# Patient Record
Sex: Female | Born: 1945
Health system: Southern US, Community
[De-identification: ages and names within clinical notes are randomized; demographics above are authoritative.]

## PROBLEM LIST (undated history)

## (undated) DIAGNOSIS — I872 Venous insufficiency (chronic) (peripheral): Secondary | ICD-10-CM

## (undated) DIAGNOSIS — G4730 Sleep apnea, unspecified: Secondary | ICD-10-CM

## (undated) DIAGNOSIS — Z950 Presence of cardiac pacemaker: Secondary | ICD-10-CM

## (undated) DIAGNOSIS — E669 Obesity, unspecified: Secondary | ICD-10-CM

## (undated) DIAGNOSIS — M1990 Unspecified osteoarthritis, unspecified site: Secondary | ICD-10-CM

## (undated) DIAGNOSIS — I509 Heart failure, unspecified: Secondary | ICD-10-CM

## (undated) DIAGNOSIS — I10 Essential (primary) hypertension: Secondary | ICD-10-CM

## (undated) DIAGNOSIS — I495 Sick sinus syndrome: Secondary | ICD-10-CM

## (undated) DIAGNOSIS — E785 Hyperlipidemia, unspecified: Secondary | ICD-10-CM

## (undated) DIAGNOSIS — K219 Gastro-esophageal reflux disease without esophagitis: Secondary | ICD-10-CM

## (undated) DIAGNOSIS — I890 Lymphedema, not elsewhere classified: Secondary | ICD-10-CM

## (undated) DIAGNOSIS — I5031 Acute diastolic (congestive) heart failure: Secondary | ICD-10-CM

## (undated) DIAGNOSIS — I639 Cerebral infarction, unspecified: Secondary | ICD-10-CM

## (undated) DIAGNOSIS — I4819 Other persistent atrial fibrillation: Secondary | ICD-10-CM

## (undated) DIAGNOSIS — I4891 Unspecified atrial fibrillation: Secondary | ICD-10-CM

## (undated) DIAGNOSIS — E039 Hypothyroidism, unspecified: Secondary | ICD-10-CM

## (undated) DIAGNOSIS — J45909 Unspecified asthma, uncomplicated: Secondary | ICD-10-CM

## (undated) DIAGNOSIS — F411 Generalized anxiety disorder: Secondary | ICD-10-CM

## (undated) HISTORY — DX: Hypothyroidism, unspecified: E03.9

## (undated) HISTORY — DX: Other persistent atrial fibrillation: I48.19

## (undated) HISTORY — DX: Gastro-esophageal reflux disease without esophagitis: K21.9

## (undated) HISTORY — DX: Unspecified osteoarthritis, unspecified site: M19.90

## (undated) HISTORY — DX: Acute diastolic (congestive) heart failure: I50.31

## (undated) HISTORY — DX: Unspecified atrial fibrillation: I48.91

## (undated) HISTORY — DX: Sleep apnea, unspecified: G47.30

## (undated) HISTORY — DX: Essential (primary) hypertension: I10

## (undated) HISTORY — DX: Hyperlipidemia, unspecified: E78.5

## (undated) HISTORY — PX: PACEMAKER INSERTION: SHX728

## (undated) HISTORY — DX: Sick sinus syndrome: I49.5

## (undated) HISTORY — DX: Generalized anxiety disorder: F41.1

## (undated) HISTORY — DX: Cerebral infarction, unspecified: I63.9

## (undated) HISTORY — DX: Obesity, unspecified: E66.9

## (undated) HISTORY — DX: Heart failure, unspecified: I50.9

## (undated) HISTORY — DX: Venous insufficiency (chronic) (peripheral): I87.2

---

## 2007-12-01 ENCOUNTER — Ambulatory Visit: Payer: Self-pay | Admitting: Cardiology

## 2007-12-05 ENCOUNTER — Inpatient Hospital Stay (HOSPITAL_COMMUNITY): Admission: AD | Admit: 2007-12-05 | Discharge: 2007-12-08 | Payer: Self-pay | Admitting: Internal Medicine

## 2007-12-05 ENCOUNTER — Ambulatory Visit: Payer: Self-pay | Admitting: Cardiology

## 2007-12-07 ENCOUNTER — Encounter: Payer: Self-pay | Admitting: Cardiology

## 2007-12-12 ENCOUNTER — Ambulatory Visit: Payer: Self-pay | Admitting: Cardiology

## 2007-12-18 ENCOUNTER — Ambulatory Visit: Payer: Self-pay | Admitting: Cardiology

## 2007-12-20 ENCOUNTER — Ambulatory Visit: Payer: Self-pay | Admitting: Cardiology

## 2007-12-26 ENCOUNTER — Ambulatory Visit: Payer: Self-pay | Admitting: Cardiology

## 2008-01-02 ENCOUNTER — Ambulatory Visit: Payer: Self-pay

## 2008-01-02 ENCOUNTER — Ambulatory Visit: Payer: Self-pay | Admitting: Cardiovascular Disease

## 2008-01-04 ENCOUNTER — Ambulatory Visit: Payer: Self-pay | Admitting: Cardiology

## 2008-01-12 ENCOUNTER — Ambulatory Visit: Payer: Self-pay

## 2008-01-19 ENCOUNTER — Ambulatory Visit: Payer: Self-pay | Admitting: Internal Medicine

## 2008-01-26 ENCOUNTER — Inpatient Hospital Stay (HOSPITAL_COMMUNITY): Admission: AD | Admit: 2008-01-26 | Discharge: 2008-01-30 | Payer: Self-pay | Admitting: Internal Medicine

## 2008-01-26 ENCOUNTER — Ambulatory Visit: Payer: Self-pay | Admitting: Internal Medicine

## 2008-02-02 ENCOUNTER — Ambulatory Visit: Payer: Self-pay | Admitting: Internal Medicine

## 2008-02-02 LAB — CONVERTED CEMR LAB
Chloride: 107 meq/L (ref 96–112)
GFR calc Af Amer: 65 mL/min
GFR calc non Af Amer: 53 mL/min
INR: 1.8 — ABNORMAL HIGH (ref 0.8–1.0)
Potassium: 4.2 meq/L (ref 3.5–5.1)
Prothrombin Time: 20.3 s — ABNORMAL HIGH (ref 10.9–13.3)
Sodium: 140 meq/L (ref 135–145)

## 2008-02-14 ENCOUNTER — Ambulatory Visit: Payer: Self-pay | Admitting: Internal Medicine

## 2008-02-21 ENCOUNTER — Ambulatory Visit: Payer: Self-pay | Admitting: Cardiology

## 2008-02-21 DIAGNOSIS — I4821 Permanent atrial fibrillation: Secondary | ICD-10-CM | POA: Insufficient documentation

## 2008-02-21 DIAGNOSIS — E1169 Type 2 diabetes mellitus with other specified complication: Secondary | ICD-10-CM

## 2008-02-21 DIAGNOSIS — I10 Essential (primary) hypertension: Secondary | ICD-10-CM

## 2008-02-21 DIAGNOSIS — I4891 Unspecified atrial fibrillation: Secondary | ICD-10-CM

## 2008-02-21 DIAGNOSIS — E785 Hyperlipidemia, unspecified: Secondary | ICD-10-CM

## 2008-03-05 ENCOUNTER — Ambulatory Visit: Payer: Self-pay | Admitting: Cardiology

## 2008-03-19 ENCOUNTER — Ambulatory Visit: Payer: Self-pay | Admitting: Cardiology

## 2008-04-02 ENCOUNTER — Ambulatory Visit: Payer: Self-pay | Admitting: Cardiology

## 2008-05-02 ENCOUNTER — Ambulatory Visit: Payer: Self-pay | Admitting: Internal Medicine

## 2008-05-16 ENCOUNTER — Encounter: Payer: Self-pay | Admitting: Internal Medicine

## 2008-05-16 ENCOUNTER — Ambulatory Visit: Payer: Self-pay | Admitting: Internal Medicine

## 2008-05-31 ENCOUNTER — Ambulatory Visit: Payer: Self-pay | Admitting: Cardiology

## 2008-06-21 ENCOUNTER — Ambulatory Visit: Payer: Self-pay | Admitting: Cardiology

## 2008-07-02 ENCOUNTER — Encounter: Payer: Self-pay | Admitting: Internal Medicine

## 2008-07-02 ENCOUNTER — Ambulatory Visit: Payer: Self-pay | Admitting: Internal Medicine

## 2008-07-05 ENCOUNTER — Ambulatory Visit: Payer: Self-pay | Admitting: Cardiology

## 2008-07-26 ENCOUNTER — Ambulatory Visit: Payer: Self-pay | Admitting: Cardiology

## 2008-07-31 ENCOUNTER — Telehealth (INDEPENDENT_AMBULATORY_CARE_PROVIDER_SITE_OTHER): Payer: Self-pay | Admitting: *Deleted

## 2008-08-01 ENCOUNTER — Telehealth: Payer: Self-pay | Admitting: Internal Medicine

## 2008-08-08 ENCOUNTER — Telehealth (INDEPENDENT_AMBULATORY_CARE_PROVIDER_SITE_OTHER): Payer: Self-pay | Admitting: *Deleted

## 2008-08-22 ENCOUNTER — Telehealth: Payer: Self-pay | Admitting: Internal Medicine

## 2008-08-27 ENCOUNTER — Ambulatory Visit: Payer: Self-pay

## 2008-08-29 ENCOUNTER — Ambulatory Visit: Payer: Self-pay | Admitting: Internal Medicine

## 2008-09-24 ENCOUNTER — Encounter: Payer: Self-pay | Admitting: Internal Medicine

## 2008-09-24 ENCOUNTER — Ambulatory Visit: Payer: Self-pay | Admitting: Cardiology

## 2008-09-30 ENCOUNTER — Telehealth: Payer: Self-pay | Admitting: Adult Health

## 2008-10-17 ENCOUNTER — Ambulatory Visit: Payer: Self-pay | Admitting: Cardiology

## 2008-10-18 ENCOUNTER — Encounter: Payer: Self-pay | Admitting: Internal Medicine

## 2008-10-20 ENCOUNTER — Ambulatory Visit: Payer: Self-pay | Admitting: Cardiology

## 2008-10-20 ENCOUNTER — Inpatient Hospital Stay (HOSPITAL_COMMUNITY): Admission: AD | Admit: 2008-10-20 | Discharge: 2008-10-25 | Payer: Self-pay | Admitting: Cardiology

## 2008-10-21 ENCOUNTER — Encounter: Payer: Self-pay | Admitting: Cardiology

## 2008-10-28 ENCOUNTER — Encounter: Payer: Self-pay | Admitting: Internal Medicine

## 2008-10-28 ENCOUNTER — Emergency Department (HOSPITAL_COMMUNITY): Admission: EM | Admit: 2008-10-28 | Discharge: 2008-10-28 | Payer: Self-pay | Admitting: Emergency Medicine

## 2008-10-29 ENCOUNTER — Ambulatory Visit: Payer: Self-pay | Admitting: Cardiology

## 2008-10-29 ENCOUNTER — Telehealth: Payer: Self-pay | Admitting: Internal Medicine

## 2008-10-30 ENCOUNTER — Telehealth: Payer: Self-pay | Admitting: Internal Medicine

## 2008-11-05 ENCOUNTER — Ambulatory Visit: Payer: Self-pay

## 2008-11-05 ENCOUNTER — Encounter: Payer: Self-pay | Admitting: Internal Medicine

## 2008-11-07 ENCOUNTER — Ambulatory Visit: Payer: Self-pay

## 2008-11-07 ENCOUNTER — Encounter: Payer: Self-pay | Admitting: Internal Medicine

## 2008-11-11 ENCOUNTER — Encounter: Payer: Self-pay | Admitting: *Deleted

## 2008-11-11 ENCOUNTER — Telehealth: Payer: Self-pay | Admitting: Internal Medicine

## 2008-11-15 ENCOUNTER — Ambulatory Visit: Payer: Self-pay

## 2008-11-15 LAB — CONVERTED CEMR LAB
POC INR: 3.5
Prothrombin Time: 22.7 s

## 2008-11-18 ENCOUNTER — Ambulatory Visit: Payer: Self-pay | Admitting: Internal Medicine

## 2008-11-18 DIAGNOSIS — I495 Sick sinus syndrome: Secondary | ICD-10-CM | POA: Insufficient documentation

## 2008-11-18 DIAGNOSIS — Z950 Presence of cardiac pacemaker: Secondary | ICD-10-CM | POA: Insufficient documentation

## 2008-11-27 ENCOUNTER — Ambulatory Visit: Payer: Self-pay | Admitting: Cardiology

## 2008-11-27 ENCOUNTER — Telehealth: Payer: Self-pay | Admitting: Internal Medicine

## 2008-11-27 LAB — CONVERTED CEMR LAB: POC INR: 2

## 2008-12-13 ENCOUNTER — Ambulatory Visit: Payer: Self-pay | Admitting: Cardiology

## 2008-12-13 LAB — CONVERTED CEMR LAB: POC INR: 3.5

## 2008-12-18 ENCOUNTER — Ambulatory Visit: Payer: Self-pay | Admitting: Internal Medicine

## 2008-12-20 LAB — CONVERTED CEMR LAB
AST: 14 units/L (ref 0–37)
Albumin: 3.9 g/dL (ref 3.5–5.2)
BUN: 20 mg/dL (ref 6–23)
Calcium: 9.1 mg/dL (ref 8.4–10.5)
Creatinine, Ser: 1 mg/dL (ref 0.4–1.2)
TSH: 1.98 microintl units/mL (ref 0.35–5.50)
Total Bilirubin: 1.1 mg/dL (ref 0.3–1.2)

## 2008-12-26 ENCOUNTER — Telehealth (INDEPENDENT_AMBULATORY_CARE_PROVIDER_SITE_OTHER): Payer: Self-pay | Admitting: *Deleted

## 2008-12-27 ENCOUNTER — Ambulatory Visit: Payer: Self-pay | Admitting: Cardiology

## 2008-12-27 LAB — CONVERTED CEMR LAB: POC INR: 2

## 2008-12-31 ENCOUNTER — Telehealth: Payer: Self-pay | Admitting: Internal Medicine

## 2009-01-17 ENCOUNTER — Ambulatory Visit: Payer: Self-pay | Admitting: Cardiology

## 2009-01-17 LAB — CONVERTED CEMR LAB: POC INR: 2.7

## 2009-01-23 ENCOUNTER — Telehealth (INDEPENDENT_AMBULATORY_CARE_PROVIDER_SITE_OTHER): Payer: Self-pay | Admitting: *Deleted

## 2009-01-28 ENCOUNTER — Telehealth: Payer: Self-pay | Admitting: Internal Medicine

## 2009-02-10 ENCOUNTER — Ambulatory Visit: Payer: Self-pay | Admitting: Internal Medicine

## 2009-02-10 DIAGNOSIS — R82998 Other abnormal findings in urine: Secondary | ICD-10-CM | POA: Insufficient documentation

## 2009-02-12 ENCOUNTER — Telehealth: Payer: Self-pay | Admitting: Internal Medicine

## 2009-02-14 ENCOUNTER — Ambulatory Visit: Payer: Self-pay | Admitting: Cardiology

## 2009-03-14 ENCOUNTER — Ambulatory Visit: Payer: Self-pay | Admitting: Cardiology

## 2009-03-14 LAB — CONVERTED CEMR LAB: POC INR: 2.2

## 2009-04-11 ENCOUNTER — Ambulatory Visit: Payer: Self-pay | Admitting: Cardiology

## 2009-04-11 LAB — CONVERTED CEMR LAB: POC INR: 2.5

## 2009-04-23 ENCOUNTER — Telehealth: Payer: Self-pay | Admitting: Internal Medicine

## 2009-04-25 ENCOUNTER — Telehealth: Payer: Self-pay | Admitting: Internal Medicine

## 2009-05-09 ENCOUNTER — Ambulatory Visit: Payer: Self-pay | Admitting: Cardiology

## 2009-05-15 ENCOUNTER — Ambulatory Visit: Payer: Self-pay | Admitting: Internal Medicine

## 2009-05-23 ENCOUNTER — Telehealth (INDEPENDENT_AMBULATORY_CARE_PROVIDER_SITE_OTHER): Payer: Self-pay | Admitting: *Deleted

## 2009-05-29 ENCOUNTER — Telehealth: Payer: Self-pay | Admitting: Internal Medicine

## 2009-05-30 ENCOUNTER — Ambulatory Visit: Payer: Self-pay | Admitting: Cardiology

## 2009-05-30 LAB — CONVERTED CEMR LAB: POC INR: 2.5

## 2009-06-06 ENCOUNTER — Telehealth: Payer: Self-pay | Admitting: Internal Medicine

## 2009-06-20 ENCOUNTER — Ambulatory Visit: Payer: Self-pay | Admitting: Cardiology

## 2009-07-04 ENCOUNTER — Ambulatory Visit: Payer: Self-pay | Admitting: Internal Medicine

## 2009-07-04 ENCOUNTER — Ambulatory Visit: Payer: Self-pay | Admitting: Cardiology

## 2009-07-25 ENCOUNTER — Ambulatory Visit: Payer: Self-pay | Admitting: Cardiology

## 2009-08-04 ENCOUNTER — Encounter: Payer: Self-pay | Admitting: Internal Medicine

## 2009-08-08 ENCOUNTER — Ambulatory Visit: Payer: Self-pay | Admitting: Cardiology

## 2009-08-29 ENCOUNTER — Ambulatory Visit: Payer: Self-pay | Admitting: Cardiology

## 2009-09-19 ENCOUNTER — Ambulatory Visit: Payer: Self-pay | Admitting: Cardiology

## 2009-09-19 LAB — CONVERTED CEMR LAB: POC INR: 2

## 2009-10-17 ENCOUNTER — Ambulatory Visit: Payer: Self-pay | Admitting: Cardiology

## 2009-10-17 LAB — CONVERTED CEMR LAB: POC INR: 2.8

## 2009-11-07 ENCOUNTER — Ambulatory Visit: Payer: Self-pay | Admitting: Cardiology

## 2009-11-07 LAB — CONVERTED CEMR LAB: POC INR: 2.3

## 2009-12-05 ENCOUNTER — Ambulatory Visit: Payer: Self-pay | Admitting: Cardiology

## 2009-12-05 LAB — CONVERTED CEMR LAB: POC INR: 2.2

## 2009-12-26 ENCOUNTER — Ambulatory Visit: Payer: Self-pay | Admitting: Internal Medicine

## 2010-01-02 ENCOUNTER — Ambulatory Visit: Payer: Self-pay | Admitting: Cardiology

## 2010-01-02 LAB — CONVERTED CEMR LAB: POC INR: 2.4

## 2010-01-30 ENCOUNTER — Ambulatory Visit: Payer: Self-pay | Admitting: Cardiology

## 2010-01-30 LAB — CONVERTED CEMR LAB: POC INR: 2.6

## 2010-02-27 ENCOUNTER — Ambulatory Visit: Payer: Self-pay | Admitting: Cardiology

## 2010-03-27 ENCOUNTER — Ambulatory Visit: Payer: Self-pay | Admitting: Cardiology

## 2010-04-24 ENCOUNTER — Ambulatory Visit: Admission: RE | Admit: 2010-04-24 | Discharge: 2010-04-24 | Payer: Self-pay | Source: Home / Self Care

## 2010-04-26 LAB — CONVERTED CEMR LAB
AST: 18 units/L (ref 0–37)
Albumin: 3.8 g/dL (ref 3.5–5.2)
Alkaline Phosphatase: 102 units/L (ref 39–117)
BUN: 27 mg/dL — ABNORMAL HIGH (ref 6–23)
Chloride: 109 meq/L (ref 96–112)
Creatinine, Ser: 1.2 mg/dL (ref 0.4–1.2)
GFR calc non Af Amer: 47.91 mL/min (ref >60)
Glucose, Bld: 102 mg/dL — ABNORMAL HIGH (ref 70–99)
Potassium: 4.5 meq/L (ref 3.5–5.1)
TSH: 2.85 microintl units/mL (ref 0.35–5.50)
Total Protein: 7.6 g/dL (ref 6.0–8.3)

## 2010-04-28 NOTE — Medication Information (Signed)
Summary: ccr-lr  Anticoagulant Therapy  Managed by: Vashti Hey, RN Supervising MD: Diona Browner MD, Remi Deter Indication 1: Atrial Fibrillation (ICD-427.31) Lab [facility] Site: [facility] INR POC 1.6  Dietary changes: no    Health status changes: no    Bleeding/hemorrhagic complications: no    Recent/future hospitalizations: no    Any changes in medication regimen? no    Recent/future dental: no  Any missed doses?: yes     Details: Missed coumadin dose Tuesday this week  Is patient compliant with meds? yes       Allergies: No Known Drug Allergies  Anticoagulation Management History:      The patient is taking warfarin and comes in today for a routine follow up visit.  Positive risk factors for bleeding include an age of 66 years or older.  The bleeding index is 'intermediate risk'.  Positive CHADS2 values include History of HTN.  Negative CHADS2 values include Age > 26 years old.  The start date was 12/09/2007.  Her last INR was 1.8 RATIO.  Anticoagulation responsible provider: Diona Browner MD, Remi Deter.  INR POC: 1.6.  Cuvette Lot#: [id number].  Exp: 10/11.    Anticoagulation Management Assessment/Plan:      The patient's current anticoagulation dose is Warfarin sodium 3 mg tabs: Use as directed by Anticoagualtion Clinic.  The target INR is 2 - 3.  The next INR is due 07/25/2009.  Anticoagulation instructions were given to patient.  Results were reviewed/authorized by Vashti Hey, RN.  She was notified by Vashti Hey RN.         Prior Anticoagulation Instructions: INR 2.3 Continue coumadin 3mg  once daily   Current Anticoagulation Instructions: INR 1.6 Take coumadin 6mg  tonight, 4.5mg  tomorrow night then resume 3mg  once daily

## 2010-04-28 NOTE — Cardiovascular Report (Signed)
Summary: [facility]/ [facility]  [facility]/ [facility]   Imported By: Dorise Hiss 07/08/2009 16:43:55  _____________________________________________________________________  External Attachment:    Type:   Image     Comment:   External Document

## 2010-04-28 NOTE — Medication Information (Signed)
Summary: ccr-lr  Anticoagulant Therapy  Managed by: Megan Hey, RN PCP: Megan [name] Supervising MD: Myrtis Ser MD, Tinnie Gens Indication 1: Atrial Fibrillation (ICD-427.31) Lab [facility] Site: [facility] INR POC 2.9  Dietary changes: no    Health status changes: no    Bleeding/hemorrhagic complications: no    Recent/future hospitalizations: no    Any changes in medication regimen? no    Recent/future dental: no  Any missed doses?: no       Is patient compliant with meds? yes       Allergies: No Known Drug Allergies  Anticoagulation Management History:      The patient is taking warfarin and comes in today for a routine follow up visit.  Positive risk factors for bleeding include an age of 109 years or older.  The bleeding index is 'intermediate risk'.  Positive CHADS2 values include History of HTN.  Negative CHADS2 values include Age > 49 years old.  The start date was 12/09/2007.  Her last INR was 1.8 RATIO.  Anticoagulation responsible provider: Myrtis Ser MD, Tinnie Gens.  INR POC: 2.9.  Cuvette Lot#: [id number].  Exp: 10/11.    Anticoagulation Management Assessment/Plan:      The patient's current anticoagulation dose is Warfarin sodium 3 mg tabs: Use as directed by Anticoagualtion Clinic.  The target INR is 2 - 3.  The next INR is due 08/29/2009.  Anticoagulation instructions were given to patient.  Results were reviewed/authorized by Megan Hey, RN.  She was notified by Megan Hey RN.         Prior Anticoagulation Instructions: INR 1.6 Increase coumadin to 3mg  once daily except 4.5mg  on M,W,F  Current Anticoagulation Instructions: INR 2.9 Continue coumadin 3mg  once daily except 4.5mg  on M,W,F

## 2010-04-28 NOTE — Progress Notes (Signed)
Summary: discuss dental appt  Phone Note Call from Patient Call back at Home Phone [phone]   Caller: Patient Reason for Call: Talk to Nurse Details for Reason: pt has upcoming dental appt.  wanted to know has dentist office call to discuss.  Initial call taken by: Lorne Skeens,  April 25, 2009 11:11 AM  Follow-up for Phone Call        spoke with dentist office    pt aware Dennis Bast, RN, BSN  April 25, 2009 11:24 AM

## 2010-04-28 NOTE — Progress Notes (Signed)
Summary: returning call  Phone Note Call from Patient Call back at Home Phone [phone]   Caller: Patient Reason for Call: Talk to Nurse Summary of Call: pt received message from nurse, will mail another form Attn: kelly Initial call taken by: [name],  June 06, 2009 2:26 PM  Follow-up for Phone Call        rec'd papers Dennis Bast, RN, BSN  June 11, 2009 11:42 AM

## 2010-04-28 NOTE — Assessment & Plan Note (Signed)
Summary: 3 mo fu with allred   Visit Type:  Pacemaker check Primary Megan [name]:  Megan [name]   History of Present Illness: The patient presents today for routine electrophysiology followup. She has occasional palpitations.  She has been diagnosed with sleep apnea and started on CPAP.  She feels that her energy has improved.  She recently lost her balance and stubled into a wall, injuring her L arm and leg.  She was evaluated in the ER and per her report had negative pain films.  SHe has been managed by her PCP and continues to have pain predominantly in her L elbow. The patient denies symptoms of chest pain, shortness of breath, orthopnea, PND, lower extremity edema, dizziness, presyncope, syncope, or neurologic sequela. The patient is tolerating medications without difficulties and is otherwise without complaint today.   Preventive Screening-Counseling & Management  Alcohol-Tobacco     Smoking Status: quit     Year Quit: 1980  Current Medications (verified): 1)  Synthroid 150 Mcg Tabs (Levothyroxine Sodium) .... Take 1 Tablet By Mouth Once A Day 2)  Metoprolol Tartrate 100 Mg Tabs (Metoprolol Tartrate) .... Take One Tablet By Mouth Twice A Day 3)  Warfarin Sodium 3 Mg Tabs (Warfarin Sodium) .... Use As Directed By Anticoagualtion Clinic 4)  Nexium 40 Mg Cpdr (Esomeprazole Magnesium) .... Take 1 Capsule By Mouth Once A Day 5)  Lipitor 40 Mg Tabs (Atorvastatin Calcium) .... Take One Tablet By Mouth Daily. 6)  Klor-Con 10 10 Meq Cr-Tabs (Potassium Chloride) .... As Needed With Fluid Pill 7)  Alprazolam 0.25 Mg Tabs (Alprazolam) .Marland Kitchen.. 1 By Mouth As Needed 8)  Nasacort Aq 55 Mcg/act Aers (Triamcinolone Acetonide(Nasal)) .Marland Kitchen.. 1 Spray As Needed 9)  Furosemide 40 Mg Tabs (Furosemide) .... Take 1 Tablet By Mouth Once A Day As Needed  Allergies (verified): No Known Drug Allergies  Comments:  Nurse/Medical Assistant: The patient's medications and allergies were  verbally reviewed with the patient  and were updated in the Medication and Allergy Lists.  Past History:  Past Medical History:  1. Hypertension.   2. Hyperlipidemia.   3. Degenerative joint disease.   4. GERD.   5. Persistent Atrial fibrillation   6. Previously documented post-termination pauses with afib s/p PPM  Past Surgical History: Reviewed history from 05/15/2009 and no changes required. s/p PPM  Social History: Reviewed history from 02/21/2008 and no changes required. The patient lives in [place] with her son.  She denies tobacco, alcohol, or drug use.   Review of Systems       All systems are reviewed and negative except as listed in the HPI.   Vital Signs:  Patient profile:   65 year old female Height:      62 inches Weight:      251 pounds Pulse rate:   103 / minute BP sitting:   107 / 73  (left arm) Cuff size:   large  Vitals Entered By: Megan [name] (December 26, 2009 3:54 PM)  Physical Exam  General:  obese, NAD Head:  normocephalic and atraumatic Eyes:  PERRLA/EOM intact; conjunctiva and lids normal. Mouth:  Teeth, gums and palate normal. Oral mucosa normal. Neck:  Neck supple, no JVD. No masses, thyromegaly or abnormal cervical nodes. Chest Wall:  pacemaker site is well  healed Lungs:  Clear bilaterally to auscultation and percussion. Heart:  RRR (paced), no m/r/g Abdomen:  Bowel sounds positive; abdomen soft and non-tender without masses, organomegaly, or hernias noted. No hepatosplenomegaly. Msk:  no obvious deformity,  strength/ sensation appear to be intact, though she has pain with extension of her L elbow Extremities:  No clubbing or cyanosis. Neurologic:  Alert and oriented x 3. Skin:  small ecchymotic area over L shin Psych:  Normal affect.   PPM Specifications Following MD:  Megan Range, MD     PPM Vendor:  [employer] Jude     PPM Model Number:  [id number]     PPM Serial Number:  [id number] PPM DOI:  10/23/2008     PPM Implanting MD:  Megan Range, MD  Lead 1    Location: [facility]      DOI: 10/23/2008     Model #: [id number]     Serial #: [id number]     Status: active Lead 2    Location: RV     DOI: 10/23/2008     Model #: 1688TC     Serial #: [id number]     Status: active  Magnet Response Rate:  BOL 100 ERI  85    PPM Follow Up Battery Voltage:  2.96 V     Battery Est. Longevity:  7.6-8.5 yrs     Pacer Dependent:  No       PPM Device Measurements Atrium  Amplitude: 0.6 mV, Impedance: 340 ohms,  Right Ventricle  Amplitude: 12.0 mV, Impedance: 510 ohms, Threshold: 1.25 V at 0.4 msec  Episodes MS Episodes:  446     Percent Mode Switch:  91%     Coumadin:  Yes Ventricular High Rate:  0     Atrial Pacing:  12%     Ventricular Pacing:  24%  Parameters Mode:  DDDR     Lower Rate Limit:  60     Upper Rate Limit:  120 Paced AV Delay:  200     Sensed AV Delay:  200 Next Cardiology Appt Due:  06/01/2010 Tech Comments:  446 AMS EPISODES--91% OF TIME IN MODE SWITCH. (LAST CHECK 42% OF TIME).  NORMAL DEVICE FUNCTION.  NO CHANGES MADE. ROV IN 6 MTHS W/DEVICE CLINIC. Megan [name]  December 26, 2009 4:45 PM MD Comments:  agree  Impression & Recommendations:  Problem # 1:  ATRIAL FIBRILLATION (ICD-427.31) She is in afib most of the time.  She has failed medical therapy with flecainide, tikosyn, and amiodarone.  She appears to be doing much better with rate control.  She is appropriately treated with coumadin. Compliance with CPAP and weight loss were encouraged.  Problem # 2:  OTHER NONSPECIFIC FINDING EXAMINATION OF URINE (ICD-791.9) normal pacemaker function for sypmtomatic bradycardia as above  Problem # 3:  HYPERTENSION, BENIGN (ICD-401.1) stable no changes today  Problem # 4:  BRADYCARDIA (ICD-427.89) as above  Patient Instructions: 1)  return in 6 months

## 2010-04-28 NOTE — Medication Information (Signed)
Summary: ccr-lr  Anticoagulant Therapy  Managed by: Vashti Hey, RN Supervising MD: Myrtis Ser MD, Tinnie Gens Indication 1: Atrial Fibrillation (ICD-427.31) Lab [facility] [place] Site: [facility] INR POC 2.4  Dietary changes: no    Health status changes: no    Bleeding/hemorrhagic complications: no    Recent/future hospitalizations: no    Any changes in medication regimen? no    Recent/future dental: no  Any missed doses?: no       Is patient compliant with meds? yes       Allergies: No Known Drug Allergies  Anticoagulation Management History:      The patient is taking warfarin and comes in today for a routine follow up visit.  Positive risk factors for bleeding include an age of 66 years or older.  The bleeding index is 'intermediate risk'.  Positive CHADS2 values include History of HTN.  Negative CHADS2 values include Age > 66 years old.  The start date was 12/09/2007.  Her last INR was 1.8 RATIO.  Anticoagulation responsible provider: Myrtis Ser MD, Tinnie Gens.  INR POC: 2.4.  Cuvette Lot#: [id number].  Exp: 10/11.    Anticoagulation Management Assessment/Plan:      The patient's current anticoagulation dose is Warfarin sodium 3 mg tabs: Use as directed by Anticoagualtion Clinic.  The target INR is 2 - 3.  The next INR is due 06/06/2009.  Anticoagulation instructions were given to patient.  Results were reviewed/authorized by Vashti Hey, RN.  She was notified by Vashti Hey RN.         Prior Anticoagulation Instructions: INR 2.5 Continue coumadin 3mg  once daily   Current Anticoagulation Instructions: INR 2.4 Continue coumadin 3mg  once daily

## 2010-04-28 NOTE — Medication Information (Signed)
Summary: ccr-lr  Anticoagulant Therapy  Managed by: Vashti Hey, RN PCP: [name] Supervising MD: Diona Browner MD, Remi Deter Indication 1: Atrial Fibrillation (ICD-427.31) Lab [facility] [place] Site: [facility] INR POC 1.6  Dietary changes: no    Health status changes: no    Bleeding/hemorrhagic complications: no    Recent/future hospitalizations: no    Any changes in medication regimen? no    Recent/future dental: no  Any missed doses?: no       Is patient compliant with meds? yes       Allergies: No Known Drug Allergies  Anticoagulation Management History:      The patient is taking warfarin and comes in today for a routine follow up visit.  Positive risk factors for bleeding include an age of 49 years or older.  The bleeding index is 'intermediate risk'.  Positive CHADS2 values include History of HTN.  Negative CHADS2 values include Age > 3 years old.  The start date was 12/09/2007.  Her last INR was 1.8 RATIO.  Anticoagulation responsible provider: Diona Browner MD, Remi Deter.  INR POC: 1.6.  Cuvette Lot#: [id number].  Exp: 10/11.    Anticoagulation Management Assessment/Plan:      The patient's current anticoagulation dose is Warfarin sodium 3 mg tabs: Use as directed by Anticoagualtion Clinic.  The target INR is 2 - 3.  The next INR is due 08/08/2009.  Anticoagulation instructions were given to patient.  Results were reviewed/authorized by Vashti Hey, RN.  She was notified by Vashti Hey RN.         Prior Anticoagulation Instructions: INR 1.6 Take coumadin 6mg  tonight, 4.5mg  tomorrow night then resume 3mg  once daily   Current Anticoagulation Instructions: INR 1.6 Increase coumadin to 3mg  once daily except 4.5mg  on M,W,F

## 2010-04-28 NOTE — Assessment & Plan Note (Signed)
Summary: 3 mo f/u [facility]   Visit Type:  3 months follow up  CC:  No complains.  History of Present Illness: The patient presents today for routine electrophysiology followup.  She reports doing very well since last being seen in our clinic. The patient denies symptoms of palpitations, chest pain, shortness of breath, orthopnea, PND, lower extremity edema, dizziness, presyncope, syncope, or neurologic sequela. She is unaware of any recent symptoms of afib.  The patient is tolerating medications without difficulties and is otherwise without complaint today.   Current Medications (verified): 1)  Synthroid 125 Mcg Tabs (Levothyroxine Sodium) .... Take 1 Tablet By Mouth Once A Day 2)  Metoprolol Tartrate 50 Mg Tabs (Metoprolol Tartrate) .Marland Kitchen.. 1 1/2 Tabs By Mouth Two Times A Day 3)  Warfarin Sodium 3 Mg Tabs (Warfarin Sodium) .... Use As Directed By Anticoagualtion Clinic 4)  Nexium 40 Mg Cpdr (Esomeprazole Magnesium) .... Take 1 Capsule By Mouth Once A Day 5)  Lipitor 40 Mg Tabs (Atorvastatin Calcium) .... Take One Tablet By Mouth Daily. 6)  Klor-Con 10 10 Meq Cr-Tabs (Potassium Chloride) .... As Needed With Hctz 7)  Alprazolam 0.25 Mg Tabs (Alprazolam) .Marland Kitchen.. 1 By Mouth As Needed 8)  Nasacort Aq 55 Mcg/act Aers (Triamcinolone Acetonide(Nasal)) .Marland Kitchen.. 1 Spray As Needed 9)  Amiodarone Hcl 200 Mg Tabs (Amiodarone Hcl) .... Take One Tablet By Mouth Once Daily 10)  Hydrochlorothiazide 25 Mg Tabs (Hydrochlorothiazide) .... 1/2 Tablet Daily  Allergies (verified): No Known Drug Allergies  Past History:  Past Medical History: Reviewed history from 11/18/2008 and no changes required.  1. Hypertension.   2. Hyperlipidemia.   3. Degenerative joint disease.   4. GERD.   5. Paroxysmal Atrial fibrillation   6. Previously documented post-termination pauses with afib s/p PPM  Past Surgical History: s/p PPM  Social History: Reviewed history from 02/21/2008 and no changes required. The patient lives in  [place] with her son.  She denies tobacco, alcohol, or drug use.   Review of Systems       All systems are reviewed and negative except as listed in the HPI.   Vital Signs:  Patient profile:   65 year old female Height:      62 inches Weight:      252.75 pounds BMI:     46.40 Pulse rate:   72 / minute Pulse rhythm:   irregular Resp:     18 per minute BP sitting:   118 / 80  (left arm) Cuff size:   large  Vitals Entered By: Vikki Ports (May 15, 2009 2:41 PM)  Physical Exam  General:  obese, NAD Head:  normocephalic and atraumatic Eyes:  PERRLA/EOM intact; conjunctiva and lids normal. Nose:  no deformity, discharge, inflammation, or lesions Mouth:  Teeth, gums and palate normal. Oral mucosa normal. Neck:  Neck supple, no JVD. No masses, thyromegaly or abnormal cervical nodes. Chest Wall:  pacemaker site is well  healed Lungs:  Clear bilaterally to auscultation and percussion. Heart:  RRR (paced), no m/r/g Abdomen:  Bowel sounds positive; abdomen soft and non-tender without masses, organomegaly, or hernias noted. No hepatosplenomegaly. Msk:  Back normal, normal gait. Muscle strength and tone normal. Pulses:  pulses normal in all 4 extremities Extremities:  No clubbing or cyanosis. Neurologic:  Alert and oriented x 3.  CNII-XII intact, strength/sensation are intact Skin:  Intact without lesions or rashes. Cervical Nodes:  no significant adenopathy Psych:  Normal affect.   EKG  Procedure date:  05/15/2009  Findings:      coarse afib, demand V pacing at 70 bpm  PPM Specifications Following MD:  Hillis Range, MD     PPM Vendor:  [employer] Jude     PPM Model Number:  [id number]     PPM Serial Number:  [id number] PPM DOI:  10/23/2008     PPM Implanting MD:  Hillis Range, MD  Lead 1    Location: [facility]     DOI: 10/23/2008     Model #: 1688TC     Serial #: [id number]     Status: active Lead 2    Location: [facility]     DOI: 10/23/2008     Model #: 1688TC     Serial #: [id number]      Status: active  Magnet Response Rate:  BOL 100 ERI  85    PPM Follow Up Remote Check?  No Battery Voltage:  2.96 V     Battery Est. Longevity:  4.8-7.0     Pacer Dependent:  No       PPM Device Measurements Atrium  Amplitude: 1.4 mV, Impedance: 310 ohms,  Right Ventricle  Amplitude: 11.4 mV, Impedance: 540 ohms, Threshold: 1.0 V at 0.4 msec  Episodes MS Episodes:  2954     Percent Mode Switch:  48%     Coumadin:  Yes Ventricular High Rate:  0     Atrial Pacing:  50     Ventricular Pacing:  25%  Parameters Mode:  DDDR     Lower Rate Limit:  60     Upper Rate Limit:  120 Paced AV Delay:  200     Sensed AV Delay:  200 MD Comments:  pt is in afib the majority of the time.  V rates appear to be controlled by histogram.  Will consider switching to DDIR if she remains in afib.  Impression & Recommendations:  Problem # 1:  CARDIAC PACEMAKER IN SITU (ICD-V45.01) Normal pacemaker funciton. No changes  Problem # 2:  ATRIAL FIBRILLATION (ICD-427.31) Persistent afib, refractory to medical therapy. presently in afib on amiodarone. Her ventricular rates appear controlled and she is minimally symptomatic. I will therefore recommend a rate control strategy at this time. We will stop amiodarone and increase metoprolol. Continue coumadin. Check TFTs and LFTs after being on amiodarone  Problem # 3:  HYPERTENSION, BENIGN (ICD-401.1) per pt, bp is above goal at home. We will increase metoprolol today and follow heart rates by histogram. check BMET today  Problem # 4:  HYPERLIPIDEMIA-MIXED (ICD-272.4) stable no changes  Her updated medication list for this problem includes:    Lipitor 40 Mg Tabs (Atorvastatin calcium) .Marland Kitchen... Take one tablet by mouth daily.  Orders: TLB-BMP (Basic Metabolic Panel-BMET) (80048-METABOL) TLB-TSH (Thyroid Stimulating Hormone) (84443-TSH) TLB-Hepatic/Liver Function Pnl (80076-HEPATIC)  Patient Instructions: 1)  Your physician recommends that you schedule a  follow-up appointment in: 3 months with Dr Johney Frame 2)  Your physician has recommended you make the following change in your medication: stop amiodarone and increase Metoprolol to 100mg  two times a day 3)  Your physician recommends that you return for lab work today 4)  Weight loss Prescriptions: METOPROLOL TARTRATE 100 MG TABS (METOPROLOL TARTRATE) Take one tablet by mouth twice a day  #60 x 11   Entered by:   Dennis Bast, RN, BSN   Authorized by:   Hillis Range, MD   Signed by:   Dennis Bast, RN, BSN on 05/15/2009   Method used:   Electronically to  [facility]  [address] (retail)       [address]       [place], [place]  [address]       Ph: [phone]       Fax: [phone]   RxID:   [id number]

## 2010-04-28 NOTE — Medication Information (Signed)
Summary: ccr-lr  Anticoagulant Therapy  Managed by: Vashti Hey, RN Supervising MD: Andee Lineman MD, Michelle Piper Indication 1: Atrial Fibrillation (ICD-427.31) Lab [facility] [place] Site: [facility] INR POC 2.3  Dietary changes: no    Health status changes: no    Bleeding/hemorrhagic complications: no    Recent/future hospitalizations: no    Any changes in medication regimen? no    Recent/future dental: no  Any missed doses?: no       Is patient compliant with meds? yes       Allergies: No Known Drug Allergies  Anticoagulation Management History:      The patient is taking warfarin and comes in today for a routine follow up visit.  Positive risk factors for bleeding include an age of 66 years or older.  The bleeding index is 'intermediate risk'.  Positive CHADS2 values include History of HTN.  Negative CHADS2 values include Age > 66 years old.  The start date was 12/09/2007.  Her last INR was 1.8 RATIO.  Anticoagulation responsible provider: Andee Lineman MD, Michelle Piper.  INR POC: 2.3.  Cuvette Lot#: [id number].  Exp: 10/11.    Anticoagulation Management Assessment/Plan:      The patient's current anticoagulation dose is Warfarin sodium 3 mg tabs: Use as directed by Anticoagualtion Clinic.  The target INR is 2 - 3.  The next INR is due 07/22/2009.  Anticoagulation instructions were given to patient.  Results were reviewed/authorized by Vashti Hey, RN.  She was notified by Vashti Hey RN.         Prior Anticoagulation Instructions: INR 2.5 Amiodarone 200mg  d/c on 05/15/09 by Dr Johney Frame Continue coumadin 3mg  once daily   Current Anticoagulation Instructions: INR 2.3 Continue coumadin 3mg  once daily

## 2010-04-28 NOTE — Progress Notes (Signed)
Summary: stopped amiodarone  Phone Note Call from Patient   Caller: Patient Reason for Call: Talk to Nurse Summary of Call: Pt called to report she saw Dr Johney Frame last week and he stopped her amiodarone on 05/15/09.  Will reheck INR on 05/30/09 instead on 3/11 due to med change.  Pt verbalized understanding. Initial call taken by: Vashti Hey RN,  May 23, 2009 1:57 PM

## 2010-04-28 NOTE — Medication Information (Signed)
Summary: rov/sp  Anticoagulant Therapy  Managed by: Vashti Hey, RN PCP: [name] Supervising MD: Andee Lineman MD, Michelle Piper Indication 1: Atrial Fibrillation (ICD-427.31) Lab [facility] [place] Site: [place] INR POC 2.8  Dietary changes: no    Health status changes: no    Bleeding/hemorrhagic complications: no    Recent/future hospitalizations: no    Any changes in medication regimen? no    Recent/future dental: no  Any missed doses?: no       Is patient compliant with meds? yes       Allergies: No Known Drug Allergies  Anticoagulation Management History:      The patient is taking warfarin and comes in today for a routine follow up visit.  Positive risk factors for bleeding include an age of 66 years or older.  The bleeding index is 'intermediate risk'.  Positive CHADS2 values include History of HTN.  Negative CHADS2 values include Age > 69 years old.  The start date was 12/09/2007.  Her last INR was 1.8 RATIO.  Anticoagulation responsible provider: Andee Lineman MD, Michelle Piper.  INR POC: 2.8.  Cuvette Lot#: [id number].  Exp: 10/2010.    Anticoagulation Management Assessment/Plan:      The patient's current anticoagulation dose is Warfarin sodium 3 mg tabs: Use as directed by Anticoagualtion Clinic.  The target INR is 2 - 3.  The next INR is due 11/14/2009.  Anticoagulation instructions were given to patient.  Results were reviewed/authorized by Vashti Hey, RN.  She was notified by Vashti Hey RN.         Prior Anticoagulation Instructions: INR 2.0  Take 1 1/2 tablets today then resume same dose of 1 tablet every day except 1 1/2 tablets on Monday and Thursday.   Current Anticoagulation Instructions: INR 2.8 Continue coumadin 3mg  once daily except 4.5mg  on Mondays and Thursdays

## 2010-04-28 NOTE — Progress Notes (Signed)
Summary: Dr.Neal's office ahve questions about pt health.  Phone Note Other Incoming   Caller: Dr.Neal office [phone] or [phone]  Darl Pikes Summary of Call: Dr.Neal office need to know if the pt stable to have dental work done or do she need to have a medications before dental work Initial call taken by: Judie Grieve,  April 23, 2009 3:10 PM  Follow-up for Phone Call        spoke with Darl Pikes no needed for pre-meds for cleaning and will call back if she needs any extractions to work out her Warfarin. Dennis Bast, RN, BSN  April 23, 2009 4:10 PM

## 2010-04-28 NOTE — Medication Information (Signed)
Summary: ccr-lr  Anticoagulant Therapy  Managed by: Vashti Hey, RN Supervising MD: Diona Browner MD, Remi Deter Indication 1: Atrial Fibrillation (ICD-427.31) [facility] [place] Site: [place] INR POC 2.5  Dietary changes: no    Health status changes: no    Bleeding/hemorrhagic complications: no    Recent/future hospitalizations: no    Any changes in medication regimen? no    Recent/future dental: no  Any missed doses?: no       Is patient compliant with meds? yes       Allergies: No Known Drug Allergies  Anticoagulation Management History:      The patient is taking warfarin and comes in today for a routine follow up visit.  Negative risk factors for bleeding include an age less than 24 years old.  The bleeding index is 'low risk'.  Positive CHADS2 values include History of HTN.  Negative CHADS2 values include Age > 56 years old.  The start date was 12/09/2007.  Her last INR was 1.8 RATIO.  Anticoagulation responsible provider: Diona Browner MD, Remi Deter.  INR POC: 2.5.  Cuvette Lot#: [id number].  Exp: 10/11.    Anticoagulation Management Assessment/Plan:      The patient's current anticoagulation dose is Warfarin sodium 3 mg tabs: Use as directed by Anticoagualtion Clinic.  The target INR is 2 - 3.  The next INR is due 05/09/2009.  Anticoagulation instructions were given to patient.  Results were reviewed/authorized by Vashti Hey, RN.  She was notified by Vashti Hey RN.         Prior Anticoagulation Instructions: INR 2.2  Continue coumadin 3mg  once daily   Current Anticoagulation Instructions: INR 2.5 Continue coumadin 3mg  once daily

## 2010-04-28 NOTE — Medication Information (Signed)
Summary: ccr-lr  Anticoagulant Therapy  Managed by: Vashti Hey, RN PCP: [name] Supervising MD: Myrtis Ser MD, Tinnie Gens Indication 1: Atrial Fibrillation (ICD-427.31) Lab [facility] [place] Site: [place] INR POC 2.3  Dietary changes: no    Health status changes: no    Bleeding/hemorrhagic complications: no    Recent/future hospitalizations: no    Any changes in medication regimen? no    Recent/future dental: no  Any missed doses?: no       Is patient compliant with meds? yes       Allergies: No Known Drug Allergies  Anticoagulation Management History:      The patient is taking warfarin and comes in today for a routine follow up visit.  Positive risk factors for bleeding include an age of 66 years or older.  The bleeding index is 'intermediate risk'.  Positive CHADS2 values include History of HTN.  Negative CHADS2 values include Age > 66 years old.  The start date was 12/09/2007.  Her last INR was 1.8 RATIO.  Anticoagulation responsible provider: Myrtis Ser MD, Tinnie Gens.  INR POC: 2.3.  Cuvette Lot#: [id number].  Exp: 10/2010.    Anticoagulation Management Assessment/Plan:      The patient's current anticoagulation dose is Warfarin sodium 3 mg tabs: Use as directed by Anticoagualtion Clinic.  The target INR is 2 - 3.  The next INR is due 12/05/2009.  Anticoagulation instructions were given to patient.  Results were reviewed/authorized by Vashti Hey, RN.  She was notified by Vashti Hey RN.         Prior Anticoagulation Instructions: INR 2.8 Continue coumadin 3mg  once daily except 4.5mg  on Mondays and Thursdays  Current Anticoagulation Instructions: INR 2.3 Continue coumadin 3mg  once daily except 4.5mg  on Mondays and Thursdays

## 2010-04-28 NOTE — Cardiovascular Report (Signed)
Summary: [facility]  [facility]/ [facility] SUMMARY   Imported By: Dorise Hiss 12/29/2009 16:05:39  _____________________________________________________________________  External Attachment:    Type:   Image     Comment:   External Document

## 2010-04-28 NOTE — Medication Information (Signed)
Summary: ccr-lr  Anticoagulant Therapy  Managed by: Vashti Hey, RN Supervising MD: Andee Lineman MD, Michelle Piper Indication 1: Atrial Fibrillation (ICD-427.31) [facility]  Site: [place] INR POC 2.5  Dietary changes: no    Health status changes: no    Bleeding/hemorrhagic complications: no    Recent/future hospitalizations: no    Any changes in medication regimen? yes       Details: amiodarone stopped by Dr Johney Frame  Recent/future dental: no  Any missed doses?: no       Is patient compliant with meds? yes       Allergies: No Known Drug Allergies  Anticoagulation Management History:      The patient is taking warfarin and comes in today for a routine follow up visit.  Positive risk factors for bleeding include an age of 66 years or older.  The bleeding index is 'intermediate risk'.  Positive CHADS2 values include History of HTN.  Negative CHADS2 values include Age > 51 years old.  The start date was 12/09/2007.  Her last INR was 1.8 RATIO.  Anticoagulation responsible provider: Andee Lineman MD, Michelle Piper.  INR POC: 2.5.  Cuvette Lot#: [id number].  Exp: 10/11.    Anticoagulation Management Assessment/Plan:      The patient's current anticoagulation dose is Warfarin sodium 3 mg tabs: Use as directed by Anticoagualtion Clinic.  The target INR is 2 - 3.  The next INR is due 06/20/2009.  Anticoagulation instructions were given to patient.  Results were reviewed/authorized by Vashti Hey, RN.  She was notified by Vashti Hey RN.         Prior Anticoagulation Instructions: INR 2.4 Continue coumadin 3mg  once daily   Current Anticoagulation Instructions: INR 2.5 Amiodarone 200mg  d/c on 05/15/09 by Dr Johney Frame Continue coumadin 3mg  once daily

## 2010-04-28 NOTE — Progress Notes (Signed)
Summary: question concerning afib  Phone Note Call from Patient Call back at Home Phone [phone]   Caller: Patient Reason for Call: Talk to Nurse Details for Reason: Per pt calling,  question concerning her afib. will not be at home until 11:30  Initial call taken by: Lorne Skeens,  May 29, 2009 9:27 AM  Follow-up for Phone Call        called pt not home will call back after 11:30 Dennis Bast, RN, BSN  May 29, 2009 10:40 AM  returning call, [name]  May 29, 2009 12:02 PM  Called pt back.  Looking for insurace forms.  ? why he stopped Amiodarone.  She is feeling it more now than she did prior to stopping.  HR is getting up to 120bpm.  Will discuss with Dr Johney Frame and call her back. Dennis Bast, RN, BSN  May 29, 2009 12:07 PM  Additional Follow-up for Phone Call Additional follow up Details #1::        Per Dr Johney Frame increase Metoprolol to 150mg  two times a day Dennis Bast, RN, BSN  May 30, 2009 12:29 PM Spoke with pt her BP 116/52  HR 60 today.  If afib happens can take an extra 50mg  of Metoprolol to help with increased HR.  Pt states she is scared to increase to 150mg  two times a day at this time. Dennis Bast, RN, BSN  May 30, 2009 3:39 PM

## 2010-04-28 NOTE — Medication Information (Signed)
Summary: ccr-lr  Anticoagulant Therapy  Managed by: Vashti Hey, RN PCP: [name] Supervising MD: Antoine Poche MD, Fayrene Fearing Indication 1: Atrial Fibrillation (ICD-427.31) Lab [facility] [place] Site: [place] INR POC 2.4  Dietary changes: no    Health status changes: no    Bleeding/hemorrhagic complications: no    Recent/future hospitalizations: no    Any changes in medication regimen? yes       Details: was on prednisone x 1 week   Finished 2 weeks ago  Recent/future dental: no  Any missed doses?: no       Is patient compliant with meds? yes       Allergies: No Known Drug Allergies  Anticoagulation Management History:      The patient is taking warfarin and comes in today for a routine follow up visit.  Positive risk factors for bleeding include an age of 66 years or older.  The bleeding index is 'intermediate risk'.  Positive CHADS2 values include History of HTN.  Negative CHADS2 values include Age > 58 years old.  The start date was 12/09/2007.  Her last INR was 1.8 RATIO.  Anticoagulation responsible provider: Antoine Poche MD, Fayrene Fearing.  INR POC: 2.4.  Cuvette Lot#: [id number].  Exp: 10/2010.    Anticoagulation Management Assessment/Plan:      The patient's current anticoagulation dose is Warfarin sodium 3 mg tabs: Use as directed by Anticoagualtion Clinic.  The target INR is 2 - 3.  The next INR is due 01/30/2010.  Anticoagulation instructions were given to patient.  Results were reviewed/authorized by Vashti Hey, RN.  She was notified by Vashti Hey RN.         Prior Anticoagulation Instructions: INR 2.2 Continue coumadin 3mg  once daily except 4.5mg  on Mondays and Thursdays  Current Anticoagulation Instructions: INR 2.4 Continue coumadin 3mg  once daily except 4.5mg  on Mondays and Thursdays

## 2010-04-28 NOTE — Assessment & Plan Note (Signed)
Summary: 3 MONTH ROV/SL   Visit Type:  Pacemaker check Primary Provider:  [name]  CC:  pacer check.  History of Present Illness: The patient presents today for routine electrophysiology followup. She reports doing very well since last being seen in our clinic.  She has occasional palpitations.  She also snores and wakes her self from sleeping at night. The patient denies symptoms of chest pain, shortness of breath, orthopnea, PND, lower extremity edema, dizziness, presyncope, syncope, or neurologic sequela. The patient is tolerating medications without difficulties and is otherwise without complaint today.   Preventive Screening-Counseling & Management  Alcohol-Tobacco     Smoking Status: quit     Year Quit: 1980  Current Medications (verified): 1)  Synthroid 125 Mcg Tabs (Levothyroxine Sodium) .... Take 1 Tablet By Mouth Once A Day 2)  Metoprolol Tartrate 100 Mg Tabs (Metoprolol Tartrate) .... Take One Tablet By Mouth Twice A Day 3)  Warfarin Sodium 3 Mg Tabs (Warfarin Sodium) .... Use As Directed By Anticoagualtion Clinic 4)  Nexium 40 Mg Cpdr (Esomeprazole Magnesium) .... Take 1 Capsule By Mouth Once A Day 5)  Lipitor 40 Mg Tabs (Atorvastatin Calcium) .... Take One Tablet By Mouth Daily. 6)  Klor-Con 10 10 Meq Cr-Tabs (Potassium Chloride) .... As Needed With Hctz 7)  Alprazolam 0.25 Mg Tabs (Alprazolam) .Marland Kitchen.. 1 By Mouth As Needed 8)  Nasacort Aq 55 Mcg/act Aers (Triamcinolone Acetonide(Nasal)) .Marland Kitchen.. 1 Spray As Needed 9)  Furosemide 40 Mg Tabs (Furosemide) .... Take 1 Tablet By Mouth Once A Day As Needed  Allergies (verified): No Known Drug Allergies  Comments:  Nurse/Medical Assistant: The patient's medications and allergies were reviewed with the patient and were updated in the Medication and Allergy Lists. Verbally gave names and doses.  Past History:  Past Medical History: Reviewed history from 11/18/2008 and no changes required.  1. Hypertension.   2.  Hyperlipidemia.   3. Degenerative joint disease.   4. GERD.   5. Paroxysmal Atrial fibrillation   6. Previously documented post-termination pauses with afib s/p PPM  Past Surgical History: Reviewed history from 05/15/2009 and no changes required. s/p PPM  Social History: Reviewed history from 02/21/2008 and no changes required. The patient lives in [place] with her son.  She denies tobacco, alcohol, or drug use. Smoking Status:  quit  Review of Systems       All systems are reviewed and negative except as listed in the HPI.   Vital Signs:  Patient profile:   65 year old female Height:      62 inches Weight:      255 pounds O2 Sat:      98 % Pulse rate:   86 / minute BP sitting:   135 / 83  (left arm) Cuff size:   large  Vitals Entered By: [name] (July 04, 2009 3:55 PM) CC: pacer check   Physical Exam  General:  obese, NAD Head:  normocephalic and atraumatic Eyes:  PERRLA/EOM intact; conjunctiva and lids normal. Mouth:  Teeth, gums and palate normal. Oral mucosa normal. Neck:  Neck supple, no JVD. No masses, thyromegaly or abnormal cervical nodes. Chest Wall:  pacemaker site is well  healed Lungs:  Clear bilaterally to auscultation and percussion. Heart:  RRR (paced), no m/r/g Abdomen:  Bowel sounds positive; abdomen soft and non-tender without masses, organomegaly, or hernias noted. No hepatosplenomegaly. Msk:  Back normal, normal gait. Muscle strength and tone normal. Pulses:  pulses normal in all 4 extremities Extremities:  No clubbing or cyanosis. Neurologic:  Alert and oriented x 3.  CNII-XII intact, strength/sensation are intact Skin:  Intact without lesions or rashes. Cervical Nodes:  no significant adenopathy Psych:  Normal affect.   PPM Specifications Following MD:  Hillis Range, MD     PPM Vendor:  [employer]     PPM Model Number:  [id number]     PPM Serial Number:  [id number] PPM DOI:  10/23/2008     PPM Implanting MD:  Hillis Range, MD  Lead 1     Location: [facility]     DOI: 10/23/2008     Model #: 1688TC     Serial #: [id number]     Status: active Lead 2    Location: [facility]     DOI: 10/23/2008     Model #: 1688TC     Serial #: [id number]     Status: active  Magnet Response Rate:  BOL 100 ERI  85    PPM Follow Up Remote Check?  No Battery Voltage:  2.98 V     Battery Est. Longevity:  9.3 YEARS     Pacer Dependent:  No       PPM Device Measurements Atrium  Amplitude: 2.1 mV, Impedance: 330 ohms, Threshold: 1.0 V at 0.4 msec Right Ventricle  Amplitude: 10.6 mV, Impedance: 560 ohms, Threshold: 1.25 V at 0.5 msec  Episodes MS Episodes:  907     Percent Mode Switch:  42%     Coumadin:  Yes Ventricular High Rate:  0     Atrial Pacing:  54%     Ventricular Pacing:  19%  Parameters Mode:  DDDR     Lower Rate Limit:  60     Upper Rate Limit:  120 Paced AV Delay:  200     Sensed AV Delay:  200 Next Cardiology Appt Due:  12/27/2009 Tech Comments:  Normal device function.  Ventricular autocapture turned off because of instability of trend.  No other changes made today.  Pt prefers office visits to [facility] [facility].   ROV 6 months clinic. Gypsy Balsam RN BSN  July 04, 2009 4:17 PM  MD Comments:  agree  Impression & Recommendations:  Problem # 1:  CARDIAC PACEMAKER IN SITU (ICD-V45.01) normal pacemaker function no changes  Problem # 2:  ATRIAL FIBRILLATION (ICD-427.31) stable continue coumadin given her snoring, will refer for sleep study, as sleep apnea may also be a trigger for afib.  Other Orders: Misc. Referral (Misc. Ref)  Patient Instructions: 1)  You have been referred to Dr. Ninetta Lights for sleep study. 2)  Your physician wants you to follow-up in: 3 months. You will receive a reminder letter in the mail one-two months in advance. If you don't receive a letter, please call our office to schedule the follow-up appointment. 3)  Your physician recommends that you continue on your current medications as directed. Please refer to the Current  Medication list given to you today.

## 2010-04-28 NOTE — Medication Information (Signed)
Summary: ccr-lr  Anticoagulant Therapy  Managed by: Vashti Hey, RN PCP: [name] Supervising MD: Myrtis Ser MD, Tinnie Gens Indication 1: Atrial Fibrillation (ICD-427.31) Lab [facility] [place] Site: [place] INR POC 2.2  Dietary changes: no    Health status changes: no    Bleeding/hemorrhagic complications: no    Recent/future hospitalizations: no    Any changes in medication regimen? no    Recent/future dental: no  Any missed doses?: no       Is patient compliant with meds? yes       Allergies: No Known Drug Allergies  Anticoagulation Management History:      The patient is taking warfarin and comes in today for a routine follow up visit.  Positive risk factors for bleeding include an age of 66 years or older.  The bleeding index is 'intermediate risk'.  Positive CHADS2 values include History of HTN.  Negative CHADS2 values include Age > 60 years old.  The start date was 12/09/2007.  Her last INR was 1.8 RATIO.  Anticoagulation responsible provider: Myrtis Ser MD, Tinnie Gens.  INR POC: 2.2.  Cuvette Lot#: [id number].  Exp: 10/2010.    Anticoagulation Management Assessment/Plan:      The patient's current anticoagulation dose is Warfarin sodium 3 mg tabs: Use as directed by Anticoagualtion Clinic.  The target INR is 2 - 3.  The next INR is due 01/02/2010.  Anticoagulation instructions were given to patient.  Results were reviewed/authorized by Vashti Hey, RN.  She was notified by Vashti Hey RN.         Prior Anticoagulation Instructions: INR 2.3 Continue coumadin 3mg  once daily except 4.5mg  on Mondays and Thursdays  Current Anticoagulation Instructions: INR 2.2 Continue coumadin 3mg  once daily except 4.5mg  on Mondays and Thursdays

## 2010-04-28 NOTE — Medication Information (Signed)
Summary: ccr-lr  Anticoagulant Therapy  Managed by: Vashti Hey, RN PCP: [name] Supervising MD: Diona Browner MD, Remi Deter Indication 1: Atrial Fibrillation (ICD-427.31) Lab [facility] [place] Site: [place] INR POC 2.2  Dietary changes: no    Health status changes: no    Bleeding/hemorrhagic complications: no    Recent/future hospitalizations: no    Any changes in medication regimen? no    Recent/future dental: no  Any missed doses?: yes     Details: missed 1/2 tablets twice  Is patient compliant with meds? yes       Allergies: No Known Drug Allergies  Anticoagulation Management History:      The patient is taking warfarin and comes in today for a routine follow up visit.  Positive risk factors for bleeding include an age of 66 years or older.  The bleeding index is 'intermediate risk'.  Positive CHADS2 values include History of HTN.  Negative CHADS2 values include Age > 20 years old.  The start date was 12/09/2007.  Her last INR was 1.8 RATIO.  Anticoagulation responsible provider: Diona Browner MD, Remi Deter.  INR POC: 2.2.  Cuvette Lot#: [id number].  Exp: 10/2010.    Anticoagulation Management Assessment/Plan:      The patient's current anticoagulation dose is Warfarin sodium 3 mg tabs: Use as directed by Anticoagualtion Clinic.  The target INR is 2 - 3.  The next INR is due 03/27/2010.  Anticoagulation instructions were given to patient.  Results were reviewed/authorized by Vashti Hey, RN.  She was notified by Vashti Hey RN.         Prior Anticoagulation Instructions: INR 2.8 Continue coumadin 3mg  once daily except 4.5mg  on Mondays and Thursdays  Current Anticoagulation Instructions: INR 2.2 Continue coumadin 3mg  once daily except 4.50mg  on Mondays and Thursdays

## 2010-04-28 NOTE — Medication Information (Signed)
Summary: ccr-lr  Anticoagulant Therapy  Managed by: Weston Brass, PharmD PCP: [name] Supervising MD: Andee Lineman MD, Michelle Piper Indication 1: Atrial Fibrillation (ICD-427.31) Lab [facility] [place] Site: [place] INR POC 2.0  Dietary changes: no    Health status changes: no    Bleeding/hemorrhagic complications: no    Recent/future hospitalizations: no    Any changes in medication regimen? no    Recent/future dental: no  Any missed doses?: yes     Details: may have missed 1/2 tablet a few weeks ago  Is patient compliant with meds? yes       Allergies: No Known Drug Allergies  Anticoagulation Management History:      The patient is taking warfarin and comes in today for a routine follow up visit.  Positive risk factors for bleeding include an age of 14 years or older.  The bleeding index is 'intermediate risk'.  Positive CHADS2 values include History of HTN.  Negative CHADS2 values include Age > 55 years old.  The start date was 12/09/2007.  Her last INR was 1.8 RATIO.  Anticoagulation responsible provider: Andee Lineman MD, Michelle Piper.  INR POC: 2.0.  Cuvette Lot#: [id number].  Exp: 10/2010.    Anticoagulation Management Assessment/Plan:      The patient's current anticoagulation dose is Warfarin sodium 3 mg tabs: Use as directed by Anticoagualtion Clinic.  The target INR is 2 - 3.  The next INR is due 10/17/2009.  Anticoagulation instructions were given to patient.  Results were reviewed/authorized by Weston Brass, PharmD.  She was notified by Weston Brass PharmD.         Prior Anticoagulation Instructions: INR 3.0 Pt C/O increased bruising Decrease coumadin to 3mg  once daily except 4.5mg  on Mondays and Thursdays   Current Anticoagulation Instructions: INR 2.0  Take 1 1/2 tablets today then resume same dose of 1 tablet every day except 1 1/2 tablets on Monday and Thursday.

## 2010-04-28 NOTE — Medication Information (Signed)
Summary: ccr-lr  Anticoagulant Therapy  Managed by: Vashti Hey, RN PCP: [name] Supervising MD: Andee Lineman MD, Michelle Piper Indication 1: Atrial Fibrillation (ICD-427.31) Lab [facility] [place] Site: [place] INR POC 3.0  Dietary changes: no    Health status changes: no    Bleeding/hemorrhagic complications: yes       Details: C/O slight increased bruising  Recent/future hospitalizations: no    Any changes in medication regimen? yes       Details: Synthroid increased  Recent/future dental: no  Any missed doses?: no       Is patient compliant with meds? yes       Allergies: No Known Drug Allergies  Anticoagulation Management History:      The patient is taking warfarin and comes in today for a routine follow up visit.  Positive risk factors for bleeding include an age of 47 years or older.  The bleeding index is 'intermediate risk'.  Positive CHADS2 values include History of HTN.  Negative CHADS2 values include Age > 82 years old.  The start date was 12/09/2007.  Her last INR was 1.8 RATIO.  Anticoagulation responsible provider: Andee Lineman MD, Michelle Piper.  INR POC: 3.0.  Cuvette Lot#: [id number].  Exp: 10/11.    Anticoagulation Management Assessment/Plan:      The patient's current anticoagulation dose is Warfarin sodium 3 mg tabs: Use as directed by Anticoagualtion Clinic.  The target INR is 2 - 3.  The next INR is due 09/19/2009.  Anticoagulation instructions were given to patient.  Results were reviewed/authorized by Vashti Hey, RN.  She was notified by Vashti Hey RN.         Prior Anticoagulation Instructions: INR 2.9 Continue coumadin 3mg  once daily except 4.5mg  on M,W,F  Current Anticoagulation Instructions: INR 3.0 Pt C/O increased bruising Decrease coumadin to 3mg  once daily except 4.5mg  on Mondays and Thursdays

## 2010-04-28 NOTE — Medication Information (Signed)
Summary: ccr-lr  Anticoagulant Therapy  Managed by: Vashti Hey, RN PCP: [name] Supervising MD: Andee Lineman MD, Michelle Piper Indication 1: Atrial Fibrillation (ICD-427.31) [facility] [place] Site: [place] INR POC 2.6  Dietary changes: no    Health status changes: no    Bleeding/hemorrhagic complications: no    Recent/future hospitalizations: no    Any changes in medication regimen? no    Recent/future dental: no  Any missed doses?: no       Is patient compliant with meds? yes       Allergies: No Known Drug Allergies  Anticoagulation Management History:      The patient is taking warfarin and comes in today for a routine follow up visit.  Positive risk factors for bleeding include an age of 22 years or older.  The bleeding index is 'intermediate risk'.  Positive CHADS2 values include History of HTN.  Negative CHADS2 values include Age > 10 years old.  The start date was 12/09/2007.  Her last INR was 1.8 RATIO.  Anticoagulation responsible provider: Andee Lineman MD, Michelle Piper.  INR POC: 2.6.  Cuvette Lot#: [id number].  Exp: 10/2010.    Anticoagulation Management Assessment/Plan:      The patient's current anticoagulation dose is Warfarin sodium 3 mg tabs: Use as directed by Anticoagualtion Clinic.  The target INR is 2 - 3.  The next INR is due 02/27/2010.  Anticoagulation instructions were given to patient.  Results were reviewed/authorized by Vashti Hey, RN.  She was notified by Vashti Hey RN.         Prior Anticoagulation Instructions: INR 2.4 Continue coumadin 3mg  once daily except 4.5mg  on Mondays and Thursdays   Current Anticoagulation Instructions: INR 2.8 Continue coumadin 3mg  once daily except 4.5mg  on Mondays and Thursdays

## 2010-04-30 NOTE — Medication Information (Signed)
Summary: ccr-lr  Anticoagulant Therapy  Managed by: Megan Hey, RN PCP: Megan [name] Supervising MD: Andee Lineman MD, Michelle Piper Indication 1: Atrial Fibrillation (ICD-427.31) Lab [facility] [place] Site: [place] INR POC 2.1  Dietary changes: no    Health status changes: no    Bleeding/hemorrhagic complications: no    Recent/future hospitalizations: no    Any changes in medication regimen? no    Recent/future dental: no  Any missed doses?: no       Is patient compliant with meds? yes       Allergies: No Known Drug Allergies  Anticoagulation Management History:      The patient is taking warfarin and comes in today for a routine follow up visit.  Positive risk factors for bleeding include an age of 66 years or older.  The bleeding index is 'intermediate risk'.  Positive CHADS2 values include History of HTN.  Negative CHADS2 values include Age > 77 years old.  The start date was 12/09/2007.  Her last INR was 1.8 RATIO.  Anticoagulation responsible provider: Andee Lineman MD, Michelle Piper.  INR POC: 2.1.  Cuvette Lot#: [id number].  Exp: 10/2010.    Anticoagulation Management Assessment/Plan:      The patient's current anticoagulation dose is Warfarin sodium 3 mg tabs: Use as directed by Anticoagualtion Clinic.  The target INR is 2 - 3.  The next INR is due 05/22/2010.  Anticoagulation instructions were given to patient.  Results were reviewed/authorized by Megan Hey, RN.  She was notified by Megan Hey RN.         Prior Anticoagulation Instructions: INR 2.0 Take coumadin 2 tablets tonight then resume 1 tablet once daily except 1 1/2 tablets on Tuesdays and Fridays  Current Anticoagulation Instructions: INR 2.1 Continue coumadin 3mg  once daily except 4.5mg  on T,Fri

## 2010-04-30 NOTE — Medication Information (Signed)
Summary: ccr-lr  Anticoagulant Therapy  Managed by: Vashti Hey, RN PCP: [name] Supervising MD: Diona Browner MD, Remi Deter Indication 1: Atrial Fibrillation (ICD-427.31) Lab [facility] [place] Site: [place] INR POC 2.0  Dietary changes: no    Health status changes: no    Bleeding/hemorrhagic complications: no    Recent/future hospitalizations: no    Any changes in medication regimen? no    Recent/future dental: no  Any missed doses?: no       Is patient compliant with meds? yes       Allergies: No Known Drug Allergies  Anticoagulation Management History:      The patient is taking warfarin and comes in today for a routine follow up visit.  Positive risk factors for bleeding include an age of 59 years or older.  The bleeding index is 'intermediate risk'.  Positive CHADS2 values include History of HTN.  Negative CHADS2 values include Age > 49 years old.  The start date was 12/09/2007.  Her last INR was 1.8 RATIO.  Anticoagulation responsible provider: Diona Browner MD, Remi Deter.  INR POC: 2.0.  Cuvette Lot#: [id number].  Exp: 10/2010.    Anticoagulation Management Assessment/Plan:      The patient's current anticoagulation dose is Warfarin sodium 3 mg tabs: Use as directed by Anticoagualtion Clinic.  The target INR is 2 - 3.  The next INR is due 04/24/2010.  Anticoagulation instructions were given to patient.  Results were reviewed/authorized by Vashti Hey, RN.  She was notified by Vashti Hey RN.         Prior Anticoagulation Instructions: INR 2.2 Continue coumadin 3mg  once daily except 4.50mg  on Mondays and Thursdays  Current Anticoagulation Instructions: INR 2.0 Take coumadin 2 tablets tonight then resume 1 tablet once daily except 1 1/2 tablets on Tuesdays and Fridays

## 2010-05-22 ENCOUNTER — Encounter (INDEPENDENT_AMBULATORY_CARE_PROVIDER_SITE_OTHER): Payer: Medicare Other

## 2010-05-22 ENCOUNTER — Encounter: Payer: Self-pay | Admitting: Cardiology

## 2010-05-22 DIAGNOSIS — Z7901 Long term (current) use of anticoagulants: Secondary | ICD-10-CM

## 2010-05-22 DIAGNOSIS — I4891 Unspecified atrial fibrillation: Secondary | ICD-10-CM

## 2010-05-26 NOTE — Medication Information (Signed)
Summary: ccr-lr  Anticoagulant Therapy  Managed by: Vashti Hey, RN PCP: [name] Supervising MD: Diona Browner MD, Remi Deter Indication 1: Atrial Fibrillation (ICD-427.31) Lab [facility] [place] Site: [place] INR POC 2.7  Dietary changes: no    Health status changes: no    Bleeding/hemorrhagic complications: no    Recent/future hospitalizations: no    Any changes in medication regimen? no    Recent/future dental: no  Any missed doses?: no       Is patient compliant with meds? yes       Allergies: No Known Drug Allergies  Anticoagulation Management History:      The patient is taking warfarin and comes in today for a routine follow up visit.  Positive risk factors for bleeding include an age of 66 years or older.  The bleeding index is 'intermediate risk'.  Positive CHADS2 values include History of HTN.  Negative CHADS2 values include Age > 34 years old.  The start date was 12/09/2007.  Her last INR was 1.8 RATIO.  Anticoagulation responsible provider: Diona Browner MD, Remi Deter.  INR POC: 2.7.  Cuvette Lot#: [id number].  Exp: 10/2010.    Anticoagulation Management Assessment/Plan:      The patient's current anticoagulation dose is Warfarin sodium 3 mg tabs: Use as directed by Anticoagualtion Clinic.  The target INR is 2 - 3.  The next INR is due 06/19/2010.  Anticoagulation instructions were given to patient.  Results were reviewed/authorized by Vashti Hey, RN.  She was notified by Vashti Hey RN.         Prior Anticoagulation Instructions: INR 2.1 Continue coumadin 3mg  once daily except 4.5mg  on T,Fri  Current Anticoagulation Instructions: INR 2.7 Continue coumadin 3mg  once daily except 4.5mg  on Tuesdays and Fridays

## 2010-06-17 ENCOUNTER — Encounter: Payer: Self-pay | Admitting: *Deleted

## 2010-06-17 DIAGNOSIS — Z7901 Long term (current) use of anticoagulants: Secondary | ICD-10-CM

## 2010-06-17 DIAGNOSIS — I4891 Unspecified atrial fibrillation: Secondary | ICD-10-CM

## 2010-06-19 ENCOUNTER — Ambulatory Visit (INDEPENDENT_AMBULATORY_CARE_PROVIDER_SITE_OTHER): Payer: Medicare Other | Admitting: *Deleted

## 2010-06-19 DIAGNOSIS — I4891 Unspecified atrial fibrillation: Secondary | ICD-10-CM

## 2010-06-19 DIAGNOSIS — Z7901 Long term (current) use of anticoagulants: Secondary | ICD-10-CM

## 2010-07-05 LAB — COMPREHENSIVE METABOLIC PANEL
AST: 23 U/L (ref 0–37)
BUN: 13 mg/dL (ref 6–23)
CO2: 25 mEq/L (ref 19–32)
Chloride: 108 mEq/L (ref 96–112)
Creatinine, Ser: 0.97 mg/dL (ref 0.4–1.2)
GFR calc Af Amer: >60 mL/min (ref >60)
GFR calc non Af Amer: 58 mL/min — ABNORMAL LOW (ref >60)
Glucose, Bld: 103 mg/dL — ABNORMAL HIGH (ref 70–99)
Total Bilirubin: 0.9 mg/dL (ref 0.3–1.2)

## 2010-07-05 LAB — BASIC METABOLIC PANEL
BUN: 18 mg/dL (ref 6–23)
BUN: 19 mg/dL (ref 6–23)
CO2: 22 mEq/L (ref 19–32)
Calcium: 8.8 mg/dL (ref 8.4–10.5)
Calcium: 9.3 mg/dL (ref 8.4–10.5)
Chloride: 107 mEq/L (ref 96–112)
Creatinine, Ser: 0.89 mg/dL (ref 0.4–1.2)
Creatinine, Ser: 0.96 mg/dL (ref 0.4–1.2)
Creatinine, Ser: 1.05 mg/dL (ref 0.4–1.2)
GFR calc Af Amer: >60 mL/min (ref >60)
GFR calc Af Amer: >60 mL/min (ref >60)
GFR calc non Af Amer: 53 mL/min — ABNORMAL LOW (ref >60)
GFR calc non Af Amer: 59 mL/min — ABNORMAL LOW (ref >60)
GFR calc non Af Amer: >60 mL/min (ref >60)
Glucose, Bld: 100 mg/dL — ABNORMAL HIGH (ref 70–99)
Potassium: 4.1 mEq/L (ref 3.5–5.1)

## 2010-07-05 LAB — CBC
HCT: 40.1 % (ref 36.0–46.0)
Hemoglobin: 13.6 g/dL (ref 12.0–15.0)
MCHC: 34 g/dL (ref 30.0–36.0)
MCHC: 34.1 g/dL (ref 30.0–36.0)
MCHC: 34.2 g/dL (ref 30.0–36.0)
MCV: 91.7 fL (ref 78.0–100.0)
Platelets: 188 10*3/uL (ref 150–400)
Platelets: 219 10*3/uL (ref 150–400)
RBC: 4.38 MIL/uL (ref 3.87–5.11)
RBC: 4.43 MIL/uL (ref 3.87–5.11)
RBC: 4.53 MIL/uL (ref 3.87–5.11)
RBC: 4.56 MIL/uL (ref 3.87–5.11)
RDW: 13.8 % (ref 11.5–15.5)
RDW: 14.2 % (ref 11.5–15.5)
WBC: 10.4 10*3/uL (ref 4.0–10.5)
WBC: 10.6 10*3/uL — ABNORMAL HIGH (ref 4.0–10.5)
WBC: 6.4 10*3/uL (ref 4.0–10.5)
WBC: 8.8 10*3/uL (ref 4.0–10.5)

## 2010-07-05 LAB — DIFFERENTIAL
Basophils Absolute: 0.1 10*3/uL (ref 0.0–0.1)
Eosinophils Relative: 2 % (ref 0–5)
Lymphocytes Relative: 18 % (ref 12–46)
Neutrophils Relative %: 73 % (ref 43–77)

## 2010-07-05 LAB — PROTIME-INR
INR: 1.5 (ref 0.00–1.49)
INR: 2.3 — ABNORMAL HIGH (ref 0.00–1.49)
INR: 3 — ABNORMAL HIGH (ref 0.00–1.49)
Prothrombin Time: 18.9 seconds — ABNORMAL HIGH (ref 11.6–15.2)
Prothrombin Time: 26.3 seconds — ABNORMAL HIGH (ref 11.6–15.2)
Prothrombin Time: 26.3 seconds — ABNORMAL HIGH (ref 11.6–15.2)
Prothrombin Time: 33.2 seconds — ABNORMAL HIGH (ref 11.6–15.2)
Prothrombin Time: 41.9 seconds — ABNORMAL HIGH (ref 11.6–15.2)

## 2010-07-05 LAB — HEPARIN LEVEL (UNFRACTIONATED)
Heparin Unfractionated: 0.45 IU/mL (ref 0.30–0.70)
Heparin Unfractionated: 0.77 IU/mL — ABNORMAL HIGH (ref 0.30–0.70)

## 2010-07-05 LAB — TSH: TSH: 1.167 u[IU]/mL (ref 0.350–4.500)

## 2010-07-05 LAB — HEMOCCULT GUIAC POC 1CARD (OFFICE): Fecal Occult Bld: NEGATIVE

## 2010-07-06 ENCOUNTER — Telehealth: Payer: Self-pay | Admitting: *Deleted

## 2010-07-06 NOTE — Telephone Encounter (Signed)
PT is to have a colonoscopy and was told to stop taking coumadin 3 days before and needs to know what to do after as far as follow up is needed.

## 2010-07-06 NOTE — Telephone Encounter (Signed)
Pt was told to restart coumadin night of procedure if OK with MD at her regular dose.  Recheck INR on 07/24/10.  Pt verbalized understanding.

## 2010-07-17 ENCOUNTER — Encounter: Payer: Medicare Other | Admitting: *Deleted

## 2010-07-20 ENCOUNTER — Encounter: Payer: Medicare Other | Admitting: *Deleted

## 2010-07-24 ENCOUNTER — Ambulatory Visit (INDEPENDENT_AMBULATORY_CARE_PROVIDER_SITE_OTHER): Payer: Medicare Other | Admitting: *Deleted

## 2010-07-24 DIAGNOSIS — I4891 Unspecified atrial fibrillation: Secondary | ICD-10-CM

## 2010-07-24 DIAGNOSIS — Z7901 Long term (current) use of anticoagulants: Secondary | ICD-10-CM

## 2010-07-26 ENCOUNTER — Other Ambulatory Visit: Payer: Self-pay | Admitting: Internal Medicine

## 2010-07-26 DIAGNOSIS — I4891 Unspecified atrial fibrillation: Secondary | ICD-10-CM

## 2010-07-26 DIAGNOSIS — I10 Essential (primary) hypertension: Secondary | ICD-10-CM

## 2010-07-31 ENCOUNTER — Encounter: Payer: Self-pay | Admitting: Internal Medicine

## 2010-07-31 ENCOUNTER — Ambulatory Visit (INDEPENDENT_AMBULATORY_CARE_PROVIDER_SITE_OTHER): Payer: Medicare Other | Admitting: Internal Medicine

## 2010-07-31 DIAGNOSIS — Z950 Presence of cardiac pacemaker: Secondary | ICD-10-CM

## 2010-07-31 DIAGNOSIS — I10 Essential (primary) hypertension: Secondary | ICD-10-CM

## 2010-07-31 DIAGNOSIS — E785 Hyperlipidemia, unspecified: Secondary | ICD-10-CM

## 2010-07-31 DIAGNOSIS — I4891 Unspecified atrial fibrillation: Secondary | ICD-10-CM

## 2010-07-31 DIAGNOSIS — I498 Other specified cardiac arrhythmias: Secondary | ICD-10-CM

## 2010-07-31 NOTE — Assessment & Plan Note (Signed)
Rate controlled She has not been able to maintain sinus rhythm previously.  She has failed medical therapy with flecainide, tikosyn, and amiodarone.  She would be a very poor candidate for ablation. Continue coumadin.

## 2010-07-31 NOTE — Assessment & Plan Note (Signed)
Stable No change required today  

## 2010-07-31 NOTE — Assessment & Plan Note (Signed)
Normal pacemaker function See Pace Art report No changes today  

## 2010-07-31 NOTE — Progress Notes (Signed)
The patient presents today for routine electrophysiology followup.  Since last being seen in our clinic, the patient reports doing reasonably well.  She continues to have difficulty with weight loss.  She attempts to exercise several days per week.  She has stable breathlessness with moderate activity.  Today, she denies symptoms of palpitations, chest pain, orthopnea, PND, lower extremity edema, dizziness, presyncope, syncope, or neurologic sequela.  The patient feels that she is tolerating medications without difficulties and is otherwise without complaint today.   Past Medical History  Diagnosis Date  . Hypertension   . Hyperlipidemia   . GERD (gastroesophageal reflux disease)   . Atrial fibrillation      post-termination pauses with afib s/p PPM  . Degenerative joint disease   . Acute diastolic heart failure   . Sinoatrial node dysfunction     s/p PPM  . Congestive heart failure, unspecified   . Unspecified hypothyroidism   . Anxiety state, unspecified   . Unspecified venous (peripheral) insufficiency   . Obesity   . Sleep apnea     compliant with CPAP   Past Surgical History  Procedure Date  . Pacemaker insertion     SJM by JA    Current Outpatient Prescriptions  Medication Sig Dispense Refill  . ALPRAZolam (XANAX) 0.25 MG tablet Take 0.25 mg by mouth at bedtime as needed.        Marland Kitchen atorvastatin (LIPITOR) 40 MG tablet Take 40 mg by mouth daily.        Marland Kitchen esomeprazole (NEXIUM) 40 MG capsule Take 40 mg by mouth daily before breakfast.        . furosemide (LASIX) 20 MG tablet Take 20 mg by mouth as needed.        Marland Kitchen levothyroxine (SYNTHROID, LEVOTHROID) 50 MCG tablet Take 50 mcg by mouth daily.        . metoprolol (LOPRESSOR) 100 MG tablet TAKE ONE TABLET BY MOUTH TWICE DAILY  60 tablet  2  . potassium chloride (KLOR-CON) 10 MEQ CR tablet Take 10 mEq by mouth as needed.        . triamcinolone (NASACORT) 55 MCG/ACT nasal inhaler 2 sprays by Nasal route daily.        Marland Kitchen warfarin  (COUMADIN) 3 MG tablet Take by mouth as directed.          No Known Allergies  History   Social History  . Marital Status: Widowed    Spouse Name: N/A    Number of Children: N/A  . Years of Education: N/A   Occupational History  . RETIRED    Social History Main Topics  . Smoking status: Former Smoker    Quit date: 03/29/1978  . Smokeless tobacco: Not on file  . Alcohol Use: No  . Drug Use: No  . Sexually Active: Not on file   Other Topics Concern  . Not on file   Social History Narrative  . No narrative on file   Physical Exam: Filed Vitals:   07/31/10 1606  BP: 138/81  Pulse: 101  Height: 5\' 2"  (1.575 m)  Weight: 256 lb (116.121 kg)    GEN- The patient is overweight, alert and oriented x 3 today.   Head- normocephalic, atraumatic Eyes-  Sclera clear, conjunctiva pink Ears- hearing intact Oropharynx- clear Neck- supple, no JVP Lymph- no cervical lymphadenopathy Lungs- Clear to ausculation bilaterally, normal work of breathing Chest- pacemaker pocket is well healed Heart- irregular rate and rhythm, no murmurs, rubs or gallops, PMI not laterally  displaced GI- soft, NT, ND, + BS Extremities- no clubbing, cyanosis, or edema MS- no significant deformity or atrophy Skin- no rash or lesion Psych- euthymic mood, full affect Neuro- strength and sensation are intact  Pacemaker interrogation- reviewed in detail today,  See PACEART report  Assessment and Plan: ]

## 2010-08-10 ENCOUNTER — Other Ambulatory Visit: Payer: Self-pay | Admitting: Cardiology

## 2010-08-11 NOTE — Assessment & Plan Note (Signed)
[facility]                                 ON-CALL NOTE   Megan [name], Megan [name]                 MRN:          [id number]  DATE:01/09/2008                            DOB:          February 03, 1946    She is a patient of Dr. Willa Rough in [place].  [name] called again  this evening with questions concerning her blood pressure and heart  rate.  Her question tonight is if her blood pressure is too low, as she  has a systolic of 140 and a diastolic of 51.  She is also wondering if  her heart rate is too low as it has been in the low 50s to mid 60s.  I  advised that all things considered, being on diltiazem, flecainide, and  metoprolol that her heart rate is an acceptable range.  Provided that  she is asymptomatic, which she currently is, denying any presyncope,  syncope, or significant fatigue.  Further, I advised that if anything  her blood pressure is a touch high as her systolic is in the 140s and  that a diastolic of 51 is acceptable.  She again has indicated that she  has concerns about her blood pressure and heart rate and is fairly  fixated on them which is why she has been calling every night.  I  advised that from what she is telling me, her blood pressures and heart  rates are normal and I tried to provide as much reassurance as possible.  I advised that if she had any additional questions tonight or at any  point to always feel free to call our service, and we will gladly call  her back.  She has a followup with her primary care Lawrance Wiedemann tomorrow  and tentatively has a followup with Dr. Myrtis Ser in November.  I advised her  that we will try and move that followup up, so that we can provide some  reassurance face-to-face.  She is grateful for the call back.     Megan [name], ANP  Electronically Signed    CB/MedQ  DD: 01/09/2008  DT: 01/10/2008  Job #: [id number]

## 2010-08-11 NOTE — Assessment & Plan Note (Signed)
[facility]                         ELECTROPHYSIOLOGY OFFICE NOTE   Megan [name], Megan [name]                     MRN:          [id number]  DATE:02/14/2008                            DOB:          April 04, 1945    INTRODUCTION:  Megan [name] is a pleasant 65 year old female with a  history of paroxysmal atrial fibrillation and hypertension who presents  for followup.   PROBLEM LIST:  1. Paroxysmal atrial fibrillation.  2. Hypertension.  3. Hyperlipidemia.  4. History of hypothyroidism.  5. Gastroesophageal reflux disease.   MEDICATIONS:  1. Dofetilide 250 mcg b.i.d.  2. Coumadin to maintain an INR between 2 and 3.  3. Synthroid 125 mcg daily.  4. Potassium chloride 10 mEq daily.  5. Metoprolol 25 mg b.i.d.  6. Nexium 40 mg daily.  7. Lipitor 40 mg nightly.  8. Xanax 0.25 mg p.r.n.   INTERVAL HISTORY:  Megan [name] presents today for routine followup.  She  reports doing well since last being seen in our clinic.  She reports  having one episode of symptomatic atrial fibrillation approximately 1  week ago which lasted 10 hours.  She reports taking an additional  metoprolol 25 mg with adequate heart rate control.  She subsequently  spontaneously returned to sinus rhythm.  She is otherwise doing well  since that time.  She denies chest pain, shortness of breath,  presyncope, or syncope and is otherwise without complaint today.   PHYSICAL EXAMINATION:  VITAL SIGNS:  Blood pressure 122/70, heart rate  58, respirations 18, weight 231 pounds.  GENERAL:  The patient is a well-appearing female in no acute distress.  She is alert and oriented x3.  HEENT:  Normocephalic, atraumatic.  Sclerae clear.  Conjunctivae pink.  Oropharynx clear.  NECK:  Supple.  No JVD, lymphadenopathy, or bruits.  LUNGS:  Clear to auscultation bilaterally.  HEART:  Bradycardic, regular rhythm.  No murmurs, rubs, or gallops.  GI:  Soft, nontender, nondistended, positive bowel  sounds.  EXTREMITIES:  No clubbing, cyanosis, or edema.  NEUROLOGIC:  Strength and sensation are intact.  SKIN:  No ecchymosis or lacerations.  MUSCULOSKELETAL:  No deformity or atrophy.  PSYCH:  Euthymic mood, full affect.   EKG normal sinus rhythm at 58 beats per minute, otherwise unremarkable.  QT interval is 429 milliseconds.   IMPRESSION:  1. Paroxysmal atrial fibrillation currently reasonably well controlled      with dofetilide.  She is chronically anticoagulated with Coumadin.  2. Hypertension.  The patient's blood pressure is currently at goal.   PLAN:  1. No medication changes were made today.  2. The patient will return to clinic in 3 months.     Hillis Range, MD  Electronically Signed    JA/MedQ  DD: 02/14/2008  DT: 02/15/2008  Job #: [id number]   cc:   Luis Abed, MD, [facility]  [name], PA

## 2010-08-11 NOTE — Consult Note (Signed)
Megan [name], Megan [name] NO.:  [id number]   MEDICAL RECORD NO.:  [id number]         PATIENT TYPE:  CINP   LOCATION:                               FACILITY:  [facility]   PHYSICIAN:  Megan Range, MD       DATE OF BIRTH:  04-24-1945   DATE OF CONSULTATION:  12/06/2007  DATE OF DISCHARGE:                                 CONSULTATION   CONSULTING PHYSICIAN:  1. Megan Frieze Jens Som, MD, [facility]  2. Megan Abed, MD, [facility]   REASON FOR CONSULTATION:  Atrial fibrillation management.   HISTORY OF PRESENT ILLNESS:  Megan [name] is a pleasant 65 year old female  with a history of hypertension, hyperlipidemia, and recently diagnosed  atrial fibrillation who was seen in consultation regarding therapeutic  strategies for atrial fibrillation.  The patient reports being in good  health until August 2009, when she developed episodes of heart racing.  She presented to her primary care physician's office and was found to  have atrial fibrillation with rapid ventricular rate for which she was  initiated on Lopressor.  The patient reports significant improvement in  her symptoms until developing recurrent heart racing on November 30, 2007, for which she presented to [facility].  The  patient was initiated on a Cardizem drip at that time.  She converted to  sinus rhythm on December 01, 2007, at 23:35 p.m. and was observed to  have a 3-second pause.  The patient was clear that she had no symptoms  of presyncope at that time.  She denies any other episodes of near  syncope or syncope.  She reports that her exercise tolerance is  decreased to an atrial fibrillation.  She denies chest pains, shortness  of breath, neurologic complaints, or other concerns.  The patient has  now returned to atrial fibrillation, but appears to have a relatively  well controlled heart rate in the 90s to low 110s.  She has been  initiated on heparin and Coumadin, but has not been initiated on  antiarrhythmic medication.   PAST MEDICAL HISTORY:  1. Hypertension.  2. Hyperlipidemia.  3. Degenerative joint disease.  4. GERD.  5. Atrial fibrillation (as above).   CURRENT MEDICATIONS:  1. Heparin drip.  2. Synthroid 125 mcg daily.  3. Diltiazem 180 mg daily.  4. Metoprolol 25 mg b.i.d.  5. Coumadin 5 mg nightly.   ALLERGIES:  None.   SOCIAL HISTORY:  The patient lives in [place] with her son.  She  denies tobacco, alcohol, or drug use.   FAMILY HISTORY:  Notable for Parkinson disease and cerebrovascular  disease.   REVIEW OF SYSTEMS:  All systems reviewed and negative except as per the  HPI.   PHYSICAL EXAMINATION:  VITAL SIGNS:  Blood pressure 110/70, heart rate  105, respirations 18, and sats 98%.  GENERAL:  The patient is an obese female, in no acute distress.  She is  alert and oriented x3.  HEENT:  Normocephalic and atraumatic.  Sclerae clear.  Conjunctiva pink.  Oropharynx clear.  NECK:  Supple.  No JVD, lymphadenopathy or bruits.  LUNGS:  Clear to auscultation bilaterally.  HEART:  Irregularly irregular rhythm.  No murmurs, rubs, or gallops.  GI:  Soft, nontender, nondistended, and positive bowel sounds.  EXTREMITIES:  No clubbing, cyanosis, or edema.  NEUROLOGIC:  Nonfocal.  SKIN:  No ecchymoses or lacerations.  MUSCULOSKELETAL:  No deformity or atrophy.  PSYCHIATRIC:  Euthymic mood, full affect.   EKG, atrial fibrillation with an average ventricular rate of 100 beats  per minute.   An echocardiogram was performed at [facility], December 01, 2007, which revealed a left atrial size of 46 mm with an ejection  fraction of 59% with mild LVH.   IMPRESSION:  Megan [name] is a very pleasant 65 year old female with  hypertension, hyperlipidemia, and persistent atrial fibrillation who  presents with atrial fibrillation.  She is currently reasonably well  rate controlled.  She did have a single 3-second pause upon converting  to sinus  rhythm on a diltiazem drip.  She has had no symptomatic  episodes of near syncope or syncope.  I therefore do not think that the  patient would benefit from a pacemaker at the present time.  Given her  episodes of rapid heart rates, I do think that we should pursue rhythm  control with an antiarrhythmic medication.  The patient has been  initiated on heparin and Coumadin though she is currently not  therapeutic.  She may therefore benefit from transesophageal  echocardiogram to rule out left atrial appendage thrombus before  proceeding with further attempts at heart rhythm control.   PLAN:  The therapeutic strategies for atrial fibrillation including  medicine and therapies were discussed in detail with the patient today.  We will continue the patient on anticoagulation with Coumadin with a  goal INR of 2-3.  We will obtain transesophageal echocardiogram tomorrow  morning.  Should the patient have no evidence of left atrial thrombus,  we would then pursue rhythm control.  I believe the patient would be a  reasonable candidate for flecainide therapy.  We will therefore begin  flecainide 50 mg twice daily and titrate in the outpatient setting.  We  will obtain a stress test as an outpatient as well after initiation of  flecainide to rule out for arrhythmias with the medication.      Megan Range, MD  Electronically Signed     JA/MEDQ  D:  12/06/2007  T:  12/07/2007  Job:  [id number]   cc:   Megan Frieze. Jens Som, MD, [facility]  Megan [name]

## 2010-08-11 NOTE — Assessment & Plan Note (Signed)
[facility]                                 ON-CALL NOTE   IYONA, PEHRSON                 MRN:          [id number]  DATE:01/06/2008                            DOB:          26-Apr-1945    PRIMARY CARDIOLOGIST:  Luis Abed, MD, [employer] in [place].   Ms. Palazzola had called because she was having symptomatic tachycardia.  She stated her heart rate was as high as 130.  She has taken her morning  medications including flecainide 100 mg, diltiazem 180 mg, and  metoprolol 25 mg;  however, the metoprolol was taken fairly recently.  She stated that while sitting on the couch, she had some dizziness.  She  stated she did not wish to call 911 and come to the hospital right away.  She wished to manage this as an outpatient.  I therefore advised her to  wait 2 hours since her last medication and if her blood pressure was  okay and her heart rate was still elevated, she could take another  metoprolol 50 mg one-half tablet.  I advised her that if this did not  help, she would need to call 911 and come to the hospital.  I strongly  emphasized to her that she should not drive.  I requested that she come  to [facility] if necessary, or if she has any other episodes of  dizziness or presyncope.  Of note, Ms. Zakarian is not currently having  chest pain or shortness of breath.      Theodore Demark, PA-C  Electronically Signed      Hillis Range, MD  Electronically Signed   RB/MedQ  DD: 01/06/2008  DT: 01/06/2008  Job #: [id number]

## 2010-08-11 NOTE — Assessment & Plan Note (Signed)
[facility]                         ELECTROPHYSIOLOGY OFFICE NOTE   Megan [name], Megan [name]                     MRN:          [id number]  DATE:05/16/2008                            DOB:          [birth year]    INTRODUCTION:  Megan [name] is a pleasant 65 year old female with a  history of paroxysmal atrial fibrillation, who presents today for  followup.   PROBLEM LIST:  1. Paroxysmal atrial fibrillation.  2. Hypertension.  3. Hyperlipidemia.  4. Venous insufficiency and diastolic dysfunction.  5. Hypothyroidism.  6. Gastroesophageal reflux disease.   CURRENT MEDICATIONS:  1. Tikosyn 250 mcg b.i.d.  2. Synthroid 125 mcg daily.  3. Lasix 20 mg p.r.n.  4. Potassium chloride 30 mEq daily.  5. Coumadin to maintain an INR between 2 and 3.  6. Nexium 40 mg daily.  7. Lipitor 40 mg nightly.  8. Metoprolol 25 mg b.i.d.  9. Xanax 0.25 mg p.r.n.   INTERVAL HISTORY:  Megan [name] presents today for electrophysiology  followup.  She reports occasional palpitations.  She estimates that she  has atrial fibrillation less than one time per month presently.  She  did, however, have 2 episodes of atrial fibrillation over the past week.  She reports that each episode lasted approximately 16 hours with  associated palpitations and nervousness.  She has been exercising  frequently as of late.  She has been walking most afternoons since also  taking aerobics and low-impact classes.  She is concerned that these may  be contributing to her atrial fibrillation.  She has also had mild  bilateral lower extremity edema chronically which has been worse over  the past few weeks.  She notes that her edema typically is worse upon  ambulation and improves at night.  She denies chest pain, shortness of  breath, orthopnea, PND, presyncope, or syncope.  She is otherwise  without complaint today.  She has tolerating Coumadin without any  difficulty with bleeding.  She denies  neurologic sequelae.   PHYSICAL EXAMINATION:  VITAL SIGNS:  Blood pressure 110/70, heart rate  68, respirations 18, weight 237 pounds, and height 5 feet 2 inches.  GENERAL:  The patient is a well-appearing female in no acute distress.  She is alert and oriented x3.  HEENT:  Normocephalic and atraumatic.  Sclerae clear.  Conjunctivae  pink.  Oropharynx clear.  NECK:  Supple.  No thyromegaly, JVD, or bruits.  LUNGS:  Clear to auscultation bilaterally.  HEART:  Regular rate and rhythm.  No murmurs, rubs, or gallops.  GI:  Soft, nontender, and nondistended.  Positive bowel sounds.  EXTREMITIES:  No clubbing or cyanosis.  She has trace lower extremity  edema bilaterally, 2+ DP/PT pulses.  NEUROLOGIC:  Cranial nerves II-XII are intact.  Strength and sensation  are intact.   EKG reveals sinus rhythm at 70 beats per minute with a QT interval  measuring 460 msec, otherwise unremarkable.   IMPRESSION:  Megan [name] is a pleasant 65 year old female with paroxysmal  atrial fibrillation, venous insufficiency, and diastolic dysfunction,  who presents today for followup.  She appears to be doing reasonably  well  with her atrial fibrillation.  She has had suppression of her  episodes with Tikosyn.  She is chronically anticoagulated with Coumadin.  She has venous insufficiency, but no overt symptoms of heart failure.  She is otherwise without complaint.   PLAN:  1. Coumadin and Tikosyn are continued.  2. Lasix 20 mg daily x3 days for venous insufficiency.  The patient is      instructed to elevate her feet at night.  3. The patient will follow up in my office in 3 months.     Hillis Range, MD  Electronically Signed    JA/MedQ  DD: 05/16/2008  DT: 05/16/2008  Job #: [id number]

## 2010-08-11 NOTE — Discharge Summary (Signed)
NAMESARAHGRACE, BROMAN              ACCOUNT NO.:  [id number]   MEDICAL RECORD NO.:  [id number]          PATIENT TYPE:  INP   LOCATION:  4707                         FACILITY:  [facility]   PHYSICIAN:  Hillis Range, MD       DATE OF BIRTH:  [birth year]   DATE OF ADMISSION:  12/05/2007  DATE OF DISCHARGE:                               DISCHARGE SUMMARY   FINAL DIAGNOSES:  1. Persistent atrial fibrillation.      a.     Symptoms of heart racing.  2. Rhythm control is our primary thrust.  3. Transesophageal echocardiogram on December 07, 2007, no left      atrial appendage thrombus and ejection fraction is normal.  4. Started flecainide 50 mg b.i.d. on December 07, 2007.   PLAN:  1. Continue Coumadin.  She presents to the [facility]in Clinic on Monday,      December 11, 2007.  2. Flecainide, we will increase to 100 mg twice daily if there is no      spontaneous conversion at followup office visit in [place] on January 04, 2008.  3. Exercise treadmill study set for September 23 , 2009.  4. If no conversion on 100 mg flecainide, then DCCD.   SECONDARY DIAGNOSES:  1. Hypertension.  2. Hypothyroidism.  3. Gastroesophageal reflux disease, chronic dyspepsia, unresponsive to      proton pump inhibitors, although the patient is taking Nexium.  4. Depression.  5. Dyslipidemia.   PROCEDURE THIS ADMISSION:  Transesophageal echocardiogram on December 06, 2007, no left atrial appendage thrombus, ejection fraction normal.  The patient started on flecainide.   BRIEF HISTORY:  Ms. Siwik is a 65 year old female.  She was admitted to  [facility] from prior admission at [facility] where she  was started on Coumadin for atrial fibrillation rapid ventricular rate.  She also had a 3-second asymptomatic pause.  She was transferred to  [facility] with the concept that the patient could require a pacemaker.  The patient was seen by Dr. Johney Frame.  The plan was made not to pursue  pacemaker at  this time but to attempt rhythm control.   HOSPITAL COURSE:  The patient presents on December 05, 2007, from  [facility].  She was seen in consultation by Dr. Johney Frame.  The  concept was to pursue rhythm control for this patient who is 65 year old  with atrial fibrillation.  Her Coumadin was continued this admission and  has been therapeutic throughout this admission.  A transesophageal  echocardiogram showed no evidence of atrial thrombus and ejection  fraction was preserved.  Because of this, the patient was started on  flecainide 50 mg twice daily.  She discharges in atrial fibrillation  with controlled rate on her flecainide.  She will continue her Coumadin.   Her medications include:  1. Coumadin 2.5 mg to start Saturday, December 09, 2007.  She is to      hold her Coumadin today, December 08, 2007.  2. Flecainide 50 mg twice daily, a new medication.  3. Diltiazem 180 mg daily, rate control.  4. Lopressor 25 mg twice daily, rate control.  5. Levothyroxine 125 mcg daily.  6. Nexium 40 mg daily.  7. Enteric-coated aspirin 325 mg daily.  8. Furosemide 20 mg daily, a new medication.  9. Potassium chloride 10 mEq daily, a new medication.   She follows up in [place], [place].  1. Coumadin Clinic [facility] on Monday, December 11, 2007, at 9:45.  2. Exercise treadmill study at [facility], outpatient admitting      on Wednesday, December 20, 2007, at 8 o'clock.  She is to have      nothing to eat after midnight Tuesday, December 19, 2007, and not      to take Cardizem the morning of December 20, 2007.  3. Office visit in [facility] to see Dr. Myrtis Ser on Thursday,      January 04, 2008, at 1 o'clock and an electrocardiogram will be      taken at that time.  If the patient still remains in atrial      fibrillation, the concept is to increase her flecainide to 100 mg      twice daily and then arrange for DCCD.   LABORATORY STUDIES THIS  ADMISSION:  Protime is 38.6 on the day of  discharge, INR is 3.6 and she will hold her Coumadin today.  On the day  of discharge, hemoglobin 13.2, hematocrit 39.1, white cells 8.6,  platelets of 220.  Serum electrolytes this admission 139 is sodium,  potassium is 4, chloride 108, carbonate 23, BUN is 11, creatinine 1.02,  glucose is 98.      Maple Mirza, [place]      Hillis Range, MD  Electronically Signed    GM/MEDQ  D:  12/08/2007  T:  12/09/2007  Job:  [id number]   cc:   Luis Abed, MD, [facility]  Aniceto Boss, NP

## 2010-08-11 NOTE — Discharge Summary (Signed)
Megan [name], Megan [name]              ACCOUNT NO.:  [id number]   MEDICAL RECORD NO.:  [id number]          PATIENT TYPE:  INP   LOCATION:  2029                         FACILITY:  [facility]   PHYSICIAN:  Hillis Range, MD       DATE OF BIRTH:  1945/08/28   DATE OF ADMISSION:  01/26/2008  DATE OF DISCHARGE:  01/30/2008                               DISCHARGE SUMMARY   FINAL DIAGNOSES:  1. Discharging after Tikosyn dose #8.  The dosage is now 250 mcg every      12 hours.  2. The QTc has moderated after reducing the dosage from 500 mcg b.i.d.      to 250 mcg b.i.d.  3. Persistent atrial fibrillation.  4. Identified that the patient has failed flecainide therapy in the      past.   SECONDARY DIAGNOSES:  1. Myoview study January 02, 2008, ejection fraction 56-65%.  No scar.      No ischemia.  2. Hypertension.  3. Dyslipidemia.  4. Chronic Coumadin.  5. Treated hypothyroidism.   Symptoms:  The patient has tachypalpitation when in atrial fibrillation  at a ventricular rate.  No procedures this admission except for  initiation of Tikosyn therapy.   BRIEF HISTORY:  Megan [name] is a 65 year old female.  She has a history  of persistent atrial fibrillation.  At times, she breaks out into rapid  rates.  These initiate tachypalpitations.  She has been tried in the  past on flecainide and has had recurrence of her atrial fibrillation at  the time of her admission for Tikosyn therapy.  The patient is in sinus  rhythm.   HOSPITAL COURSE:  The patient is admitted to [facility]  electively on January 26, 2008, for Tikosyn therapy.  Her potassium was  3.9 and it has required a supplementation with extra potassium.  The  patient had a creatinine clearance, which would justify beginning a 500  mcg every 12 hour dose.  It was noted that she slowly had increased her  QTc on that dosage and after dose 6 her QTc was about 513.  After dose 6  was reduced to 250 mcg and after 1 dose her QTc had reset  to 479  milliseconds.  This was generally acceptable and this dose will be  continued as an outpatient.  Of note, the patient has maintained sinus  rhythm throughout this hospitalization.   The patient discharges on the following medications.  1. She is to stop diltiazem 180 mg daily.  2. To start Tikosyn 250 mcg 1 tablet 7 in the morning, 1 tablet 7 in      the evening.  3. She is to take potassium chloride 20 mEq tablets one to one-half      tablets daily.  4. Metoprolol 25 mg twice daily as needed.  5. Synthroid 125 mcg daily.  6. Lasix 20 mg as needed daily.  7. Coumadin 5 mg Monday, Wednesday, Friday, and 2.5 mg Tuesday,      Thursday, Saturday, and Sunday.  8. Nexium 40 mg daily.  9. Lipitor 40 mg daily at  bedtime.  10.Xanax 0.25 mg as needed.  11.Nasacort as needed.   The patient will be given a prescription of lisinopril 5 mg daily in  case her blood pressure increases as she takes it at home greater than  130 systolic.   She has a followup appointment with Dr. Johney Frame at [facility],  [address] on Tuesday February 27, 2008, at 10 o'clock and  Coumadin Clinic, [place] Office Friday February 02, 2008, at 11 o'clock.  At  the time of discharge serum electrolytes for Mr. Pilant sodium 140,  potassium 4.2 after 30 mEq the day before, chloride 107, carbonate 27,  BUN is 16, creatinine 1.01, glucose is 99, protime on the day discharge  22.6, INR is 1.9, serum electrolytes and magnesium has been 2.3.      Maple Mirza, [place]      Hillis Range, MD  Electronically Signed    GM/MEDQ  D:  01/30/2008  T:  01/31/2008  Job:  [id number]

## 2010-08-11 NOTE — Assessment & Plan Note (Signed)
[place] [facility]                         ELECTROPHYSIOLOGY OFFICE NOTE   Megan [name], Megan [name]                     MRN:          [id number]  DATE:02/02/2008                            DOB:          [birth year]    PRIMARY CARE PHYSICIAN:  [name], who is PA with Dr. Samuel Jester  at [facility].   INTRODUCTION:  Megan [name] is a pleasant 65 year old female with a  history of paroxysmal atrial fibrillation and hypertension, who presents  today for followup.   PROBLEM LIST:  1. Paroxysmal atrial fibrillation.  2. Hypertension.  3. Hyperlipidemia.  4. History of hypothyroidism.  5. Gastroesophageal reflux disease.   CURRENT MEDICATIONS:  1. Dofetilide 250 mcg b.i.d.  2. Coumadin to maintain an INR between 2 and 3.  3. Synthroid 125 mcg daily.  4. Potassium chloride 10 mEq daily.  5. Metoprolol 25 mg p.r.n.  6. Nexium 40 mg daily.  7. Lipitor 40 mg nightly.  8. Xanax 0.25 mg p.r.n.   INTERVAL HISTORY:  Megan [name] presents today for followup.  She is  recently evaluated by me and initiated on Tikosyn on January 26, 2008.  She had minimal QT prolongation with 500 mcg twice daily and therefore  her dose was empirically reduced to 250 mcg twice daily with no further  QT prolongation.  She has tolerated this medication rather well.  Unfortunately, she has had two symptomatic episodes of atrial  fibrillation over the past week.  These episodes have begun without  precipitating trigger, have lasted approximately 12 hours each.  She  reports rapid ventricular rates.  She has taken metoprolol 25 mg with  each episode and has had subsequent conversion to sinus rhythm.  She  denies chest pain, shortness of breath, presyncope, or syncope and is  otherwise without complaint today.   PHYSICAL EXAMINATION:  VITAL SIGNS:  Blood pressure 122/80, heart rate  72, respirations 18, and weight 225 pounds.  GENERAL:  The patient is a well-appearing  female in no acute distress.  She is alert and oriented x3.  HEENT:  Normocephalic and atraumatic.  Sclerae clear.  Conjunctivae  pink.  Oropharynx clear.  NECK:  Supple.  No JVD, lymphadenopathy, or bruits.  LUNGS:  Clear.  HEART:  Regular rate and rhythm.  No murmurs, rubs, or gallops.  GI:  Soft, nontender, nondistended, positive bowel sounds.  EXTREMITIES:  No clubbing, cyanosis, or edema.  NEUROLOGIC:  Cranial nerves II through XII are intact.  Strength and  sensation are intact.   EKG, normal sinus rhythm at 70 beats per minute, QT interval 424  milliseconds.   IMPRESSION:  Megan [name] is a very pleasant 65 year old female with  paroxysmal atrial fibrillation, who presents today for followup.  She  has had two symptomatic episodes of atrial fibrillation since initiation  of Tikosyn.  At the present time, I think that the best strategy would  be to initiate the patient on metoprolol 25 mg twice daily.  She is a  little reluctant to do so due to prior bradycardia; however, her heart  rate is currently in the 70s.  I  have reassured the patient to attempt  this strategy.  If necessary, we may have to reduce her dose to 12.5 mg  twice daily down the road.  If she fails medical therapy with Tikosyn,  then I would consider Multaq as another alternative, however, we would  need to assure financial assistance with this medication.  Ultimately,  we may also have to consider catheter ablation.   PLAN:  1. Metoprolol 25 mg twice daily.  2. Continue dofetilide 250 mcg b.i.d.  3. Coumadin to maintain an INR between 2 and 3.  4. Return to clinic in 2-3 weeks.     Hillis Range, MD  Electronically Signed    JA/MedQ  DD: 02/02/2008  DT: 02/03/2008  Job #: [id number]   cc:   Luis Abed, MD, [facility]  [name], PA

## 2010-08-11 NOTE — Assessment & Plan Note (Signed)
[facility]                                 ON-CALL NOTE   VAN, EHLERT                 MRN:          [id number]  DATE:01/06/2008                            DOB:          07/05/1945    Ms. Offenberger had called because she was having tachy-palpitations, and on  her home blood pressure monitor, her heart rate was in the 120s.  She  had initially been advised to take an extra 25 mg of metoprolol, but  stated that after she did that her systolic blood pressure was in the  140s and her heart rate was still in the 120s.  I spoke with Dr. Johney Frame  and he advised that she should take an extra 100 mg of flecainide, and  if her heart rate improved then that would be her evening dose of  flecainide.  Otherwise, she would need to come to the hospital.  She did  not come to the hospital, and on January 07, 2008, I contacted her by  phone to see if she was doing well.  She states that her heart rate was  much improved, but she was still feeling a little weak.  I have left a  message with the [facility] office to contact her for a followup appointment  and advised her to recontact [place] if she develops any further  palpitations.      Theodore Demark, PA-C  Electronically Signed      Hillis Range, MD  Electronically Signed   RB/MedQ  DD: 01/07/2008  DT: 01/07/2008  Job #: [id number]

## 2010-08-11 NOTE — Op Note (Signed)
Megan [name], [name] NO.:  [id number]   MEDICAL RECORD NO.:  [id number]          PATIENT TYPE:  INP   LOCATION:  2039                         FACILITY:  [facility]   PHYSICIAN:  Hillis Range, MD       DATE OF BIRTH:  [birth year]   DATE OF PROCEDURE:  DATE OF DISCHARGE:                               OPERATIVE REPORT   SURGEON:  Hillis Range, MD   PREPROCEDURE DIAGNOSES:  1. Paroxysmal atrial fibrillation.  2. Tachycardia bradycardia syndrome.  3. Syncope.   POSTPROCEDURE DIAGNOSES:  1. Paroxysmal atrial fibrillation.  2. Tachycardia bradycardia syndrome.  3. Syncope.   PROCEDURES:  Pacemaker implantation.   INTRODUCTION:  [name] is a very pleasant 65 year old female with  paroxysmal atrial fibrillation and tachycardia bradycardia syndrome.  She has had multiple post termination pauses with recurrent syncope.  Most recently she was evaluated at [facility] and was observed to  have a pause greater than 6 seconds upon termination of atrial  fibrillation, which resulted in loss of consciousness and collapse.  She  suffered an ankle fracture.  She had previously been resistant to  consider pacemaker implantation; however, with her recent ankle  fracture, she now consents.  She presents today for dual-chamber  pacemaker implantation.   DESCRIPTION OF PROCEDURE:  Informed written consent was obtained and the  patient was brought to the electrophysiology lab in the fasting state.  She was adequately sedated with intravenous Versed and fentanyl as  outlined in the nursing report.  The patient's left chest was prepped  and draped in the usual sterile fashion by the EP lab staff.  Skin  overlying the left deltopectoral region was infiltrated with lidocaine  for local analgesia.  A 5-cm incision was made over the left  deltopectoral region.  A subcutaneous pacemaker pocket was fashioned  using a combination of sharp and blunt dissection.  Electrocautery was  used to assure hemostasis.  With fluoroscopic visualization, the left  axillary vein was cannulated.  No contrast was required.  Through the  left axillary vein, a [employer] Tendril SDX model A3393814  (serial number [id number]) right atrial lead and a [employer]  Tendril SDX model [id number] (serial number [id number]) right ventricular  lead were advanced with fluoroscopic visualization into the right atrial  appendage and mid right ventricular septal positions respectively.  Initial lead measurements revealed an atrial fibrillation wave of 3 mV  with an impedance of 460 ohms.  A moderate injury current during atrial  fibrillation was observed.  The right ventricular lead R-wave measured  13.3 mV with an impedance of 585 ohms and a threshold of 0.4 volts at  0.5 milliseconds with a very large injury current observed.  The leads  were then secured to the pectoralis fascia using #2 silk suture over the  suture sleeves.  The leads were then connected to a [employer]  Accent RF DR model [id number] (serial number [id number]) dual-chamber  pacemaker.  The pacemaker pocket was then irrigated with copious  gentamicin solution.  The pacemaker was then placed into the pocket.  The pocket  was then closed in two layers with 2.0 Vicryl suture for the  subcutaneous and subcuticular layers.  Steri-Strips and a sterile  dressing were then applied.  There were no early apparent complications.   CONCLUSIONS:  1. Tachycardia bradycardia syndrome with recurrent syncope.  2. Successful dual-chamber pacemaker implantation as described above.  3. No early apparent complications.      Hillis Range, MD  Electronically Signed     JA/MEDQ  D:  10/23/2008  T:  10/23/2008  Job:  [id number]   cc:   Luis Abed, MD, [facility]

## 2010-08-11 NOTE — Letter (Signed)
January 19, 2008    Megan Abed, MD, [facility]  [address], [address]  [place], [place] [address]   RE:  Megan, [name]  MRN:  [id number]  /  DOB:  10-30-1945   Dear Trey Paula,   I have seen our patient, Megan [name], in Electrophysiology Clinic  today for followup.  As you are aware, she is a very pleasant 65-year-  old female recently hospitalized with persistent atrial fibrillation.  I  consulted on her in the hospital and we elected to initiate her on  flecainide therapy.  She was subsequently discharged and continued on  anticoagulation with Coumadin.  An outpatient adenosine Myoview was  performed on January 02, 2008, which revealed no scar or ischemia.  She  reports doing well initially following hospital discharge.  Unfortunately, she has developed recurrent symptomatic atrial  fibrillation.  She required a hospitalization in [place] approximately  2 weeks ago, which she was treated with intravenous diltiazem with  spontaneous conversion to sinus rhythm.  We subsequently increased her  flecainide to 100 mg twice daily.  She reports despite this increase in  her medication, having frequent episodes of atrial fibrillation.  She  notes that her symptoms are primarily palpitations and occasional heart  racing.  She denies significant dyspnea, chest discomfort, fatigue, or  change in her exercise tolerance.  She denies any presyncope or syncope.  She estimates that she is in atrial fibrillation approximately one third  of the time, though she frequently has difficulty discerning symptoms.  She knows primarily that her pulse is irregular when in atrial  fibrillation.  She is certainly symptomatic when she develops rapid  ventricular rates.  These appeared to have been controlled recently.   PROBLEM LIST:  1. Persistent atrial fibrillation (as above).  2. Hypertension.  3. Hyperlipidemia.  4. Gastroesophageal reflux disease.  5. Degenerative joint disease.   CURRENT  MEDICATIONS:  1. Flecainide 100 mg twice daily.  2. Diltiazem 180 mg daily.  3. Metoprolol 25 mg b.i.d. p.r.n.  4. Synthroid 125 mcg daily.  5. Potassium chloride 10 mEq daily.  6. Lasix 20 mg daily.  7. Simvastatin 80 mg nightly.  8. Coumadin 5 mg daily.  9. Nexium 40 mg daily.  10.Lipitor 40 mg daily.  11.Xanax 0.25 mg p.r.n.  12.Nasacort p.r.n.   PHYSICAL EXAMINATION:  VITALS:  Blood pressure 106/65, heart rate 64,  respirations 18, and weight 229 pounds.  GENERAL:  The patient is an anxious appearing female, in no acute  distress.  She is alert and oriented x3.  HEENT:  Normocephalic and atraumatic.  Sclerae clear.  Conjunctivae  pink.  Oropharynx is clear.  NECK:  Supple.  No JVD, lymphadenopathy, or bruits.  No thyromegaly.  LUNGS:  Clear to auscultation bilaterally.  HEART:  Irregularly irregular rhythm.  No murmurs, rubs, or gallops.  GI:  Soft, nontender, and nondistended.  Positive bowel sounds.  EXTREMITIES:  No clubbing, cyanosis, or edema.  NEUROLOGIC:  Cranial nerves II through XII are intact.  Strength and  sensation are intact.  SKIN:  The patient has a resolving bruise over the left side of her face  following a recent dental procedure.  MUSCULOSKELETAL:  No deformity or atrophy site.  Euthymic mood.  Anxious  affect.   EKG reveals atrial fibrillation, also reveals coarse atrial fibrillation  with an average ventricular rate of 67 beats per minute.  A septal  infarct pattern in left axis deviation are noted.  Diminished QRS  voltage is also noted.   IMPRESSION:  Megan [name] is a pleasant 65 year old female with a history  of hypertension and recently discovered persistent atrial fibrillation  who presents today for an EP followup.  Though her atrial fibrillation  is typically asymptomatic, she has difficulties with rapid ventricular  rates for which she does experience heart racing.  Rapid ventricular  rate certainly cause distress and anxiety for this  patient.  She has had  multiple calls to our office over the past week due to concerns related  to both her blood pressure and heart rate despite relatively few  symptoms.   PLAN:  Therapeutic strategies for atrial fibrillation were again  discussed with the patient in clinic today including both medicine and  catheter-based therapies.  I again reviewed the risks, benefits, and  alternatives to EP study and radiofrequency ablation for atrial  fibrillation.  The patient and I agreed that pursuit of atrial  fibrillation ablation is not the appropriate strategy for her at this  time.  We therefore, elected to continue to treat her with  antiarrhythmic drugs.  Trey Paula, you have ordered a flecainide level for  today.  I am currently awaiting the results.  Should the patient have a  therapeutic flecainide level, then I would propose that we discontinue  flecainide and consider her to have failed this medication.  I would  then opt for admission to [facility] early next week for initiation of  dofetilide.  Her QT interval appears to be within the normal range and  her renal function has recently been stable.  If she is subtherapeutic  on her flecainide, we could consider an increase in her flecainide in  the next few days.  She is aware of the importance of continuing her  diltiazem for heart rate control as well as her Coumadin for  anticoagulation.    Sincerely,      Hillis Range, MD  Electronically Signed    JA/MedQ  DD: 01/19/2008  DT: 01/20/2008  Job #: [id number]

## 2010-08-11 NOTE — Assessment & Plan Note (Signed)
[facility] HEALTHCARE                                 ON-CALL NOTE   NAME:MARTINBatoul, Megan [name]                 MRN:          [id number]  DATE:01/02/2008                            DOB:          07/18/1945    The patient of Dr. Myrtis Ser, called at 10:25 p.m. on January 01, 2008 stating  that she was supposed to have a stress test the next day and she was  concerned because she took her blood pressure before bed and it was  145/90.  She is wondering if she should take any additional blood  pressure medicine.  She denied any symptoms specifically denied chest  pain, shortness of breath, headache.  I advised her to not take any  additional blood pressure medicine and to call back if she developed any  symptoms, but reassured that her blood pressure is not dangerously high.  She voiced understanding.     Christell Faith, MD  Electronically Signed    NDL/MedQ  DD: 01/02/2008  DT: 01/02/2008  Job #: [id number]

## 2010-08-11 NOTE — Discharge Summary (Signed)
[name], [name] NO.:  [id number]   MEDICAL RECORD NO.:  [id number]          PATIENT TYPE:  INP   LOCATION:  2039                         FACILITY:  [facility]   PHYSICIAN:  Hillis Range, MD       DATE OF BIRTH:  07-Oct-1945   DATE OF ADMISSION:  10/20/2008  DATE OF DISCHARGE:  10/25/2008                               DISCHARGE SUMMARY   This patient has no known drug allergies.   Time for this dictation, examination, and explanation to the patient  greater than 50 minutes partly because of the new home medication  reconciliation program, holding this up.   FINAL DIAGNOSES:  1. Admitted to [facility] on July 23 with recurrent atrial      fibrillation and uncontrolled hypertension.      a.     Atrial fibrillation has now become persistent      b.     The patient is on chronic Coumadin.  2. Previous breakthrough of atrial fibrillation, on diltiazem and      flecainide.  3. This patient has documented post-termination pauses.  She had      SYNCOPE in the evening of July 24 at [facility] with      concurrent right ankle fracture (distal right fibula with intra-      articular extension).      a.     Ortho consult with followup (nonsurgical management), Dr.       Ranell Patrick is her orthopedist.      b.     This patient is strictly nonweightbearing on the right lower       extremity.  4. Discharged from [facility] day #2, status post      implant of a [employer] Jude Accent RF DR dual-chamber pacemaker.      a.     Amiodarone started on October 24, 2008, after pacer implant.      b.     Metoprolol increased to 75 mg p.o. b.i.d. after pacer       implant.  5. Constipation this admission.   SECONDARY DIAGNOSES:  1. Hypertension.  2. Normal ejection fraction.  3. Treated hypothyroidism.  4. Dyslipidemia.  5. Obesity.  6. Gastroesophageal reflux disease.  7. Degenerative joint disease.  8. Chronic venous insufficiency.   PROCEDURES THIS  ADMISSION:  1. Implant of a St. Jude dual-chamber pacemaker on July 28 by Dr.      Johney Frame for atrial fibrillation and post-termination pauses.  2. Orthopedics has redressed her right lower extremity after      inspecting the splint there, status post right ankle fracture.   BRIEF HISTORY:  Megan [name] is a 65 year old female.  She has a history  of persistent atrial fibrillation.  She was seen by Dr. Johney Frame in  [place] [name].  Her Coumadin is followed at the [name] of [address]   The patient presented to the emergency room at [facility] on the  morning of July 23.  She had recurrent atrial fibrillation and  uncontrolled hypertension.  The patient did deny associated chest pain,  shortness of breath, presyncope, or syncope.  Her metoprolol is  currently 37.5 mg twice daily.  Dr. Johney Frame also noted that she had a  brief syncopal episode on May 1 when interrogating her, but she has had  no recurrent syncope.  Of note, Dr. Johney Frame also recommended permanent  pacemaker implantation, but the patient declined at the time of her last  visit.   The patient states that she is in and out of atrial fibrillation.  She  feels that she went into atrial fibrillation at 3 o'clock on the  afternoon of July 22 and has not converted back.  The patient is  admitted with atrial fibrillation and some rate control.  The patient  knows to increase her dose of metoprolol when she feels atrial  fibrillation present, but in this case that has not helped to terminate  her atrial fibrillation.   On the evening of July 24 at [facility], the patient had a  syncopal episode.  She felt she had a distal right fibula fracture.  Because of this, she became less resistant to pacemaker implantation and  agreed to transfer to [facility].  She arrived at [name] on  July 25.   [name] COURSE:  The patient arrived from [name] on July 25,  after a syncopal episode with  concurrent right fibula fracture.  This  was felt to be secondary to a post-termination pause of atrial  fibrillation.  The patient had been on Tikosyn therapy and this was  stopped this admission with the impression to start amiodarone after a  48-hour period of washout.  The patient did have a supratherapeutic INR,  which stalled implantation of a pacemaker for 48 hours.  She eventually  had pacemaker implanted on July 28 by Dr. Johney Frame, at that time her  metoprolol was increased from 37.5 mg twice daily to 75 mg twice daily.  She was also started at that time on amiodarone therapy 200 mg tablets 2  tablets in the morning and 2 tablets in the evening.  After a 2-week  load, she will decrease to 1 tablet in the morning and 1 tablet in the  evening.  Her Coumadin will be carefully monitored on amiodarone, and  she will have close followup in the [facility] [place].  During this admission, she had Orthopedic consult with Dr. Ranell Patrick.  He  recommended nonsurgical management, and also indicated strict  nonweightbearing to the right lower extremity.  She has an appointment  followup with Dr. Ranell Patrick as well.  The question of physical therapy and  rehabilitation have been raised at this admission.  The patient has  severe financial constraints and in fact is under lawsuit from [name] for prior admissions.  She elected to go home in fact  without even home health physical therapy.  She will have a wheelchair  so that she is not attempted to bear weight on the right lower extremity  until she sees Dr. Ranell Patrick.   We have followup appointments arranged as follows:  1. Coumadin Clinic at [facility] at [facility] on Tuesday,      August 3 at 3:30.  2. She follows up with Dr. Ranell Patrick at [name] on      Thursday, August 5 at 8:45.   She has appointments at [facility] [address], [place], a  map has been included:  1. Pacer Clinic on  Thursday, August 12 at 10:30 to check the  incision      and discuss living with pacemaker going forward.  2. To see Dr. Johney Frame on August 23 at 12:15 and to see Dr. Johney Frame once      again on Thursday, November 4 at 9:30.   MEDICATIONS:  1. Tylenol 325 mg tablets 1-2 tablets every 6 hours as needed.  The      patient declined anything stronger.  She says she is doing well      with her right lower extremity.  2. Amiodarone 200 mg tablets 2 in the morning and 2 in the evening for      the next 2 weeks to start 1 tablet in the morning and 1 tablet in      the evening on Saturday, August 14.  3. Docusate sodium 100 mg 2 tablets daily at bedtime.  4. MiraLax 17 g by mouth daily with 8 ounces of juice or water.  5. Warfarin or Coumadin 3 mg tablets daily or as directed by the      Coumadin Clinic [facility].  6. Metoprolol tartrate 50 mg tablets one and one-half tablets in the      morning and one and one-half tablets in the evening.  7. Alprazolam 0.25 mg one half to one tablet twice daily as needed.  8. Furosemide 1 tablet daily as needed.  9. Klor-Con 10 mEq 3 tablets daily.  10.Lipitor 40 mg daily in the evening.  11.Lotrisone topical cream twice daily as needed.  12.Nexium 40 mg twice daily.  13.Synthroid 125 mcg daily.  14.Rhinocort 1-2 sprays nasally daily as needed.   The patient is asked to stop taking the following:  1. Tikosyn 250 mcg twice daily.  2. Coumadin 5 mg daily.   Of note, the patient also has metoprolol 25 mg tablets.  She may take 3  in the morning and 3 in the evening until she runs out, then she  switches to 50 mg tablets one and one-half tablet twice daily.   LABORATORY STUDIES THIS ADMISSION:  On the day of discharge, her protime  is 26.3, INR is 2.3.  Once again, [employer] [facility] [employer] will  get a summary of her Coumadin dosing as well as the protimes while she  has been here.  Complete blood count on the day of discharge, white  cells  10.8, hemoglobin 14.1, hematocrit 41.2, and platelets are 193.  Basic metabolic panel on July 29, sodium 138, potassium 3.9, chloride  107, carbonate 22, glucose 116, BUN is 19, and creatinine 0.89.  TSH  this admission was 1.167.      Maple Mirza, [place]      Hillis Range, MD  Electronically Signed    GM/MEDQ  D:  10/25/2008  T:  10/25/2008  Job:  [id number]   cc:   Almedia Balls. Ranell Patrick, M.D.

## 2010-08-11 NOTE — Assessment & Plan Note (Signed)
[facility] HEALTHCARE                                 ON-CALL NOTE   Megan [name], Megan [name]                     MRN:          [id number]  DATE:06/28/2008                            DOB:          July 24, 1945    ELECTROPHYSIOLOGIST:  Hillis Range, MD   I received a page through the answering service from [facility] on  this date, questions about her medications.  I called [name] back.  She states she has been out of rhythm since about 7 o'clock last  night.  She has a history of PAF.  She is on standing  Tikosyn 250  b.i.d. and Lopressor 25 b.i.d.  She states she is asymptomatic and she  would not know she was in atrial fib unless she checked her pulse and  noticed it was irregular.  She then states that she did not know she was  in it when it first happened because she felt like she was having  palpitations in her chest.  She took an extra 50 mg of Lopressor around  4:00 a.m. and then took her standing dose of 25 this morning with her  Tikosyn.  She continues to complain of irregular heart rhythm.  She  denies any other associated symptoms.  She is reluctant to take any  further medications till she spoke with [place].  I did speak with Dr. Johney Frame  who was familiar with the patient reviewed current situation.  He states  the patient can take an extra dose of Lopressor later today if she needs  it, but if she is asymptomatic, continue her current medications.  Ms.  Hornik states she was supratherapeutic in her INR last week when it was  checked.  If she continues to have problems over the weekend.  She can  come to the emergency room for further evaluation.  If she remains  asymptomatic, she can call the office on Monday and get evaluated by Dr.  Johney Frame.      Dorian Pod, ACNP  Electronically Signed      Luis Abed, MD, [facility]  Electronically Signed   MB/MedQ  DD: 06/28/2008  DT: 06/28/2008  Job #: [id number]

## 2010-08-11 NOTE — Assessment & Plan Note (Signed)
[facility] HEALTHCARE                                 ON-CALL NOTE   WANNA, GULLY                 MRN:          [id number]  DATE:12/21/2007                            DOB:          05/04/1945    Ms. Longfield called in this evening reporting that last night her blood  pressure was low with the systolic in the 90s and heart rate was in the  40s and she felt fatigued.  She went to the [facility] ED and she was  advised to stop her Lopressor 25 mg b.i.d.  She is otherwise on  flecainide as well as diltiazem therapy which she has taken today.  She  has a history of permanent atrial fibrillation.  This evening after not  taking her Lopressor all day, her blood pressures are in the 150s with  heart rates in the 130s.  She is asking what she should do.  I have  recommended that she take a Lopressor 25 mg x1 now and to give that an  opportunity to work and if her heart rate is still up or if she becomes  unstable in the next 60-90 minutes that she should then present to the  [facility] ED for further evaluation.  I also recommended that she call  back with any additional questions.     Nicolasa Ducking, ANP  Electronically Signed    CB/MedQ  DD: 12/21/2007  DT: 12/22/2007  Job #: [id number]

## 2010-08-11 NOTE — Assessment & Plan Note (Signed)
[facility] HEALTHCARE                                 ON-CALL NOTE   [name], [name]                       MRN:          [id number]  DATE:01/06/2008                            DOB:          08-Oct-1945    PRIMARY CARDIOLOGIST:  Luis Abed, MD, [facility]   Ms. Bottoms is a patient of Dr. Myrtis Ser with a history of atrial  fibrillation with a question of atrial flutter.  She is on flecainide  100 mg b.i.d. and last saw Dr. Myrtis Ser just 2 days ago.  She called this  evening stating that she has been in atrial fibrillation with rate in  the one-teens since about 11:45 this morning.  She denies any chest pain  or shortness of breath but just in general bothered by the irregularity  of her heartbeat.  She apparently called in earlier and spoke with  Theodore Demark, and she was advised to take her evening flecainide dose,  which she took at 4:30 p.m.  Despite taking this, she remains in  irregular rhythm with rates in the mid one-teens.  She is now asking  what to do.  I recommended that given that there is some question as to  whether or not she has some atrial flutter as well and that her rhythm  did not break with her flecainide this evening, that she would likely be  best served by presenting to [facility], so that we get an EKG,  and we document her rhythm and then subsequently treat it.  She was  apparently thinking along the same lines and is planning to go to the  [facility] now.     Nicolasa Ducking, ANP  Electronically Signed    CB/MedQ  DD: 01/06/2008  DT: 01/06/2008  Job #: [id number]

## 2010-08-11 NOTE — Assessment & Plan Note (Signed)
[facility] HEALTHCARE                                 ON-CALL NOTE   ARPITA, FENTRESS                 MRN:          [id number]  DATE:01/07/2008                            DOB:          Oct 18, 1945    [name] called in again this evening because she checked her blood  pressure and the systolic was 140 with a diastolic in the 50s and a  pulse of 63.  She is questioning why there is so much variation in her  blood pressure.  I explained that throughout the day, the blood pressure  does fluctuate and that she should not expect to get the same reading  every time.  She stated that she took a metoprolol 12.5 mg earlier and  was asking what she should do this evening.  I recommended that she do  nothing.  I further recommended that she check her blood pressure in the  morning consistently and write it down and to call into our [facility]  tomorrow to arrange for a followup appointment with Dr. Myrtis Ser.  Apparently, she previously was on Diovan, and this was discontinued once  she was initiated on diltiazem and metoprolol therapy.  It may be that  she requires readdition of Diovan.  Apparently, her heart rate gets  fairly low with more beta-blockade thus going up on her beta-blocker or  calcium-channel blocker may not be a feasible option for hypertension  management.  Megan [name] also sounds somewhat anxious and apparently  takes her blood pressure multiple times throughout the day.  She admits  that she gets very anxious when she takes her blood pressure and can  feel it rising.  She may need this addressed as well.     Megan [name], ANP  Electronically Signed    CB/MedQ  DD: 01/07/2008  DT: 01/07/2008  Job #: [id number]

## 2010-08-11 NOTE — Assessment & Plan Note (Signed)
[facility]                          [place] [facility] OFFICE NOTE   NAKYIAH, KUCK                 MRN:          [id number]  DATE:01/04/2008                            DOB:          Jul 20, 1945    HISTORY OF PRESENT ILLNESS:  Ms. Turrubiates is here to follow up her atrial  fib/flutter.  She has multiple medical problems.  Additional problem is  that she is continuing to have palpitations and this is addressed fully  today.   The patient had been seen at [facility] in December 01, 2007.  She had paroxysmal atrial fibrillation.  I questioned whether in fact  this could be atrial flutter.  She converted and had a 3-second pause  and decision was made to send her to [facility] for further evaluation  thinking that she might need a pacemaker.  The patient was seen at [facility] by Dr. Johney Frame.  I do not know if the original strips from [place]  were reviewed.  It was felt, however that the patient had atrial  fibrillation.  It was felt that she should not have a pacemaker.  A TEE  was done to be sure that she had no left atrial clot.  With no clot  proven, it was decided that flecainide should be started in an attempt  to see if she would convert to sinus rhythm.  In the meantime, she had a  post hospital stress test on December 20, 2007.  At that time, she  exercised for 3 minutes.  Her peak heart rate was only 42% predicted  maximum heart rate.  Her exercise capacity was poor.  She did not have  chest pain.  There were no diagnostic EKG changes.  Because of this  finding, she had an adenosine Myoview scan arranged, this was done on  January 02, 2008.  At that time, it appeared that she possibly had atrial  flutter, they could not be gated.  There was no scar or ischemia.   She is now here for followup.  She is having some intermittent  palpitations.  She has some fatigue, but otherwise she has had no chest  pain.  There has been no syncope  or presyncope.   PAST MEDICAL HISTORY:   ALLERGIES:  No known drug allergies.   MEDICATIONS:  1. Synthroid 125 mcg.  2. Diltiazem 180.  3. Potassium 10.  4. Flecainide 50 b.i.d. (to be increased to 100 b.i.d.)  5. Lasix 20.  6. Metoprolol 25 b.i.d.  7. Simvastatin 80.  8. Coumadin as directed.  9. Nexium.   OTHER MEDICAL PROBLEMS:  See the complete list below.   REVIEW OF SYSTEMS:  She is not having any fevers or chills.  She is not  having any nausea.  There is no GU symptoms.  Her review of systems  otherwise is negative.   PHYSICAL EXAMINATION:  VITAL SIGNS:  Blood pressure today is 127/76 with  a pulse of 60.  Weight is 231 pounds.  GENERAL:  The patient is oriented to person, time, and place.  Affect is  normal.  HEENT:  No  xanthelasma.  She has normal extraocular motion.  NECK:  There are no carotid bruits.  There is no jugular venous  distention.  LUNGS:  Clear.  Respiratory effort is not labored.  CARDIAC:  S1 with an S2.  There are no clicks or significant murmurs.  ABDOMEN:  Obese, but soft.  EXTREMITIES:  She has no significant peripheral edema.   EKG today reveals sinus rhythm.   PROBLEMS:  1. History of paroxysmal atrial fibrillation and paroxysmal atrial      flutter.  I will need to review the strips at the [facility]      concerning her rhythm on the day of her Myoview scan.  If she is      having a flutter component, she may be a candidate for ablation.      Once, again it was mentioned in her note, a consultation from Dr.      Johney Frame that she had atrial fibrillation.  Her standard exercise      test revealed no stress-induced ectopy.  Her nuclear exercise test      revealed no ischemia.  Therefore, we will increase her flecainide      to 100 mg b.i.d., and I will then see her back to see if she is      having any more symptomatic arrhythmias.  If so, we will consider      an event recorder to document what type the rhythm is.  In the       meantime, I will further review her exercise test in [facility] to      see what her underlying rhythm was.  2. Obesity.  She clearly needs to lose weight.  3. Coumadin therapy.  This is being watched carefully.  4. Hypothyroidism.  She is on thyroid replacement.  5. Normal left ventricular function by echocardiogram recently.  6. Mild left ventricular hypertrophy.  7. No evidence of left atrial clot by transesophageal echocardiogram      done recently.  8. History of some depression.  9. Dyslipidemia, on medication.  10.History of hypertension, although her pressure has not been high      recently.   The patient's flecainide dose will be increased.  We will then obtain a  trough flecainide level.  I will then see her back, and I will obtain  and review further records.     Luis Abed, MD, [facility]  Electronically Signed    JDK/MedQ  DD: 01/04/2008  DT: 01/05/2008  Job #: [id number]   cc:   Remus Loffler, PA  Samuel Jester

## 2010-08-14 NOTE — Assessment & Plan Note (Signed)
[facility]                                 ON-CALL NOTE   [name], [name]                     MRN:          [id number]  DATE:09/28/2008                            DOB:          1945-10-19    I got a call tonight from [name].  She is a patient of Dr. Johney Frame.  Apparently, she has atrial fibrillation and hypertension.  She states  she took her blood pressure earlier today and it was about 140-150 when  she came home from her friend's house, it was now up to 180.  This was  about 3 o'clock in the morning.  She was wondering if she could take  some extra doses of metoprolol.  She currently takes 37.5 b.i.d.  I told  her to take an extra 25 mg of metoprolol now and if her blood pressure  was still over 160, then she should go ahead and take another 25 mg  metoprolol about 30 minutes later and recheck her blood pressure in the  morning.  If her blood pressure continues to be high, she should follow  up with our office early next week.  Obviously, if she develops any or  more symptoms from it, she can present to the ER for further evaluation.     Bevelyn Buckles. Bensimhon, MD  Electronically Signed    DRB/MedQ  DD: 09/28/2008  DT: 09/28/2008  Job #: [id number]

## 2010-08-14 NOTE — Assessment & Plan Note (Signed)
[facility]                                 ON-CALL NOTE   Megan [name], Megan [name]                     MRN:          [id number]  DATE:07/27/2008                            DOB:          1945/11/09    ELECTROPHYSIOLOGIST:  Hillis Range, MD   Received a page from answering service and returned call to [name]  regarding the patient's concerns about being in atrial fibrillation.  The patient states her blood pressure has ranged from systolic of 106-  156 and diastolic of 56-96.  The patient states higher blood pressures  noted mostly in the evening.  She also reported some reproducible right-  sided chest pain along with worsening of general fatigue/weakness over  the last several weeks.  The patient had also recorded her heart rate,  which had been in the 60s each time.  The patient was concerned  regarding her irregular heart rhythm and requested information regarding  taking more beta-blocker as she has done in the past per instructions  from Dr. Johney Frame.  I told her as her heart rate had not been  significantly elevated that she should not take significantly more beta-  blocker.  She normally has been taking 37.5 mg of Lopressor p.o. b.i.d.  I told her for her evening dose, she could take a full 50 mg.  However,  if she continue to feel worse, she should present to the emergency  department for evaluation.  I also instructed her to call and make an  appointment with Dr. Johney Frame on Monday if she did not present to the ED  for evaluation.  She indicated she understood these instructions.     Jarrett Ables, [facility]  Electronically Signed    MS/MedQ  DD: 07/27/2008  DT: 07/28/2008  Job #: [id number]

## 2010-08-21 ENCOUNTER — Ambulatory Visit (INDEPENDENT_AMBULATORY_CARE_PROVIDER_SITE_OTHER): Payer: Medicare Other | Admitting: *Deleted

## 2010-08-21 DIAGNOSIS — I4891 Unspecified atrial fibrillation: Secondary | ICD-10-CM

## 2010-08-21 DIAGNOSIS — R0989 Other specified symptoms and signs involving the circulatory and respiratory systems: Secondary | ICD-10-CM

## 2010-08-21 DIAGNOSIS — Z7901 Long term (current) use of anticoagulants: Secondary | ICD-10-CM

## 2010-08-21 LAB — POCT INR: INR: 1.8

## 2010-09-18 ENCOUNTER — Ambulatory Visit (INDEPENDENT_AMBULATORY_CARE_PROVIDER_SITE_OTHER): Payer: Medicare Other | Admitting: *Deleted

## 2010-09-18 DIAGNOSIS — Z7901 Long term (current) use of anticoagulants: Secondary | ICD-10-CM

## 2010-09-18 DIAGNOSIS — I4891 Unspecified atrial fibrillation: Secondary | ICD-10-CM

## 2010-09-25 ENCOUNTER — Ambulatory Visit (INDEPENDENT_AMBULATORY_CARE_PROVIDER_SITE_OTHER): Payer: Medicare Other | Admitting: *Deleted

## 2010-09-25 DIAGNOSIS — Z7901 Long term (current) use of anticoagulants: Secondary | ICD-10-CM

## 2010-09-25 DIAGNOSIS — I4891 Unspecified atrial fibrillation: Secondary | ICD-10-CM

## 2010-10-08 ENCOUNTER — Telehealth: Payer: Self-pay | Admitting: Internal Medicine

## 2010-10-08 NOTE — Telephone Encounter (Signed)
Patient cut back on blood pressure medication. Due to low blood pressure.

## 2010-10-13 ENCOUNTER — Ambulatory Visit (INDEPENDENT_AMBULATORY_CARE_PROVIDER_SITE_OTHER): Payer: Medicare Other | Admitting: *Deleted

## 2010-10-13 DIAGNOSIS — I4891 Unspecified atrial fibrillation: Secondary | ICD-10-CM

## 2010-10-13 DIAGNOSIS — Z7901 Long term (current) use of anticoagulants: Secondary | ICD-10-CM

## 2010-10-14 NOTE — Telephone Encounter (Signed)
Pt never got a call back regarding her decreasing her     BP medication.  Please call her and advise if this is ok or not.  Feels better since decreasing.

## 2010-10-15 NOTE — Telephone Encounter (Signed)
Pt never got a call back regarding blood pressure medication . Blood pressure is O.K. Pt stated she cut back on her B/p med's.

## 2010-10-15 NOTE — Telephone Encounter (Signed)
Have tried to call patient several times but her number is always busy  Will continue to try

## 2010-10-15 NOTE — Telephone Encounter (Signed)
Spoke with patient and in the morning she takes 100mg  of Lopressor twice daily  She has decreased the am dose to 50mg  because her BP was in the 90"s

## 2010-11-01 ENCOUNTER — Other Ambulatory Visit: Payer: Self-pay | Admitting: Internal Medicine

## 2010-11-03 ENCOUNTER — Ambulatory Visit (INDEPENDENT_AMBULATORY_CARE_PROVIDER_SITE_OTHER): Payer: Medicare Other | Admitting: *Deleted

## 2010-11-03 DIAGNOSIS — Z7901 Long term (current) use of anticoagulants: Secondary | ICD-10-CM

## 2010-11-03 DIAGNOSIS — I4891 Unspecified atrial fibrillation: Secondary | ICD-10-CM

## 2010-11-03 LAB — POCT INR: INR: 1.8

## 2010-11-20 ENCOUNTER — Ambulatory Visit (INDEPENDENT_AMBULATORY_CARE_PROVIDER_SITE_OTHER): Payer: Medicare Other | Admitting: *Deleted

## 2010-11-20 DIAGNOSIS — I4891 Unspecified atrial fibrillation: Secondary | ICD-10-CM

## 2010-11-20 DIAGNOSIS — Z7901 Long term (current) use of anticoagulants: Secondary | ICD-10-CM

## 2010-11-20 LAB — POCT INR: INR: 2.5

## 2010-12-18 ENCOUNTER — Other Ambulatory Visit: Payer: Self-pay | Admitting: Internal Medicine

## 2010-12-18 ENCOUNTER — Ambulatory Visit (INDEPENDENT_AMBULATORY_CARE_PROVIDER_SITE_OTHER): Payer: Medicare Other | Admitting: *Deleted

## 2010-12-18 DIAGNOSIS — I4891 Unspecified atrial fibrillation: Secondary | ICD-10-CM

## 2010-12-18 DIAGNOSIS — Z7901 Long term (current) use of anticoagulants: Secondary | ICD-10-CM

## 2010-12-29 LAB — BASIC METABOLIC PANEL
BUN: 15
BUN: 17
BUN: 16
BUN: 16
BUN: 16
CO2: 25
CO2: 25
CO2: 24
CO2: 25
Calcium: 8.9
Calcium: 9
Calcium: 9.1
Calcium: 9.1
Calcium: 9.2
Chloride: 107
Chloride: 107
Chloride: 110
Creatinine, Ser: 0.91
Creatinine, Ser: 0.94
Creatinine, Ser: 0.91
Creatinine, Ser: 1.03
GFR calc Af Amer: >60
GFR calc Af Amer: >60
GFR calc non Af Amer: >60
GFR calc non Af Amer: >60
GFR calc non Af Amer: 55 — ABNORMAL LOW
Glucose, Bld: 95
Glucose, Bld: 85
Glucose, Bld: 87
Glucose, Bld: 99
Potassium: 3.9
Potassium: 4.2
Sodium: 141
Sodium: 142
Sodium: 140

## 2010-12-29 LAB — PROTIME-INR
INR: 2.2 — ABNORMAL HIGH
Prothrombin Time: 25.2 — ABNORMAL HIGH

## 2010-12-29 LAB — MAGNESIUM: Magnesium: 2.3

## 2010-12-30 LAB — BASIC METABOLIC PANEL
BUN: 11
Creatinine, Ser: 1.02
GFR calc Af Amer: >60
GFR calc non Af Amer: 55 — ABNORMAL LOW

## 2010-12-30 LAB — PROTIME-INR
INR: 2.3 — ABNORMAL HIGH
INR: 2.5 — ABNORMAL HIGH
INR: 3.6 — ABNORMAL HIGH
Prothrombin Time: 28.8 — ABNORMAL HIGH
Prothrombin Time: 38.6 — ABNORMAL HIGH

## 2010-12-30 LAB — CBC
HCT: 36
Hemoglobin: 13.3
MCHC: 33.9
MCV: 92.4
Platelets: 189
Platelets: 198
Platelets: 220
RBC: 3.89
RDW: 13.6
RDW: 14.2
WBC: 8.3
WBC: 9.1

## 2010-12-30 LAB — HEPARIN LEVEL (UNFRACTIONATED)
Heparin Unfractionated: 0.29 — ABNORMAL LOW
Heparin Unfractionated: 0.5

## 2010-12-30 LAB — APTT: aPTT: 99 — ABNORMAL HIGH

## 2011-01-08 ENCOUNTER — Encounter: Payer: Self-pay | Admitting: Internal Medicine

## 2011-01-08 ENCOUNTER — Ambulatory Visit (INDEPENDENT_AMBULATORY_CARE_PROVIDER_SITE_OTHER): Payer: Medicare Other | Admitting: *Deleted

## 2011-01-08 DIAGNOSIS — I4891 Unspecified atrial fibrillation: Secondary | ICD-10-CM

## 2011-01-08 DIAGNOSIS — Z950 Presence of cardiac pacemaker: Secondary | ICD-10-CM

## 2011-01-08 LAB — PACEMAKER DEVICE OBSERVATION
AL AMPLITUDE: 0.4 mv
AL IMPEDENCE PM: 362.5 Ohm
ATRIAL PACING PM: 3.3
BATTERY VOLTAGE: 2.9478 V
BRDY-0004RV: 120 {beats}/min
RV LEAD IMPEDENCE PM: 462.5 Ohm
VENTRICULAR PACING PM: 15

## 2011-01-08 NOTE — Progress Notes (Signed)
Pacer check in clinic  

## 2011-01-15 ENCOUNTER — Ambulatory Visit (INDEPENDENT_AMBULATORY_CARE_PROVIDER_SITE_OTHER): Payer: Medicare Other | Admitting: *Deleted

## 2011-01-15 DIAGNOSIS — I4891 Unspecified atrial fibrillation: Secondary | ICD-10-CM

## 2011-01-15 DIAGNOSIS — Z7901 Long term (current) use of anticoagulants: Secondary | ICD-10-CM

## 2011-01-15 LAB — POCT INR: INR: 3

## 2011-02-12 ENCOUNTER — Ambulatory Visit (INDEPENDENT_AMBULATORY_CARE_PROVIDER_SITE_OTHER): Payer: Medicare Other | Admitting: *Deleted

## 2011-02-12 DIAGNOSIS — I4891 Unspecified atrial fibrillation: Secondary | ICD-10-CM

## 2011-02-12 DIAGNOSIS — Z7901 Long term (current) use of anticoagulants: Secondary | ICD-10-CM

## 2011-03-26 ENCOUNTER — Ambulatory Visit (INDEPENDENT_AMBULATORY_CARE_PROVIDER_SITE_OTHER): Payer: Medicare Other | Admitting: *Deleted

## 2011-03-26 DIAGNOSIS — Z7901 Long term (current) use of anticoagulants: Secondary | ICD-10-CM

## 2011-03-26 DIAGNOSIS — I4891 Unspecified atrial fibrillation: Secondary | ICD-10-CM

## 2011-04-21 ENCOUNTER — Other Ambulatory Visit: Payer: Self-pay | Admitting: Pharmacist

## 2011-04-21 MED ORDER — WARFARIN SODIUM 3 MG PO TABS: ORAL_TABLET | ORAL | Status: DC

## 2011-05-01 ENCOUNTER — Emergency Department (HOSPITAL_COMMUNITY)
Admission: EM | Admit: 2011-05-01 | Discharge: 2011-05-01 | Disposition: A | Discharge status: Home / Self Care | Payer: Medicare Other | Attending: Emergency Medicine | Admitting: Emergency Medicine

## 2011-05-01 ENCOUNTER — Other Ambulatory Visit: Payer: Self-pay

## 2011-05-01 ENCOUNTER — Encounter (HOSPITAL_COMMUNITY): Payer: Self-pay

## 2011-05-01 DIAGNOSIS — E039 Hypothyroidism, unspecified: Secondary | ICD-10-CM | POA: Insufficient documentation

## 2011-05-01 DIAGNOSIS — Z7901 Long term (current) use of anticoagulants: Secondary | ICD-10-CM | POA: Insufficient documentation

## 2011-05-01 DIAGNOSIS — R1110 Vomiting, unspecified: Secondary | ICD-10-CM

## 2011-05-01 DIAGNOSIS — E669 Obesity, unspecified: Secondary | ICD-10-CM | POA: Insufficient documentation

## 2011-05-01 DIAGNOSIS — E860 Dehydration: Secondary | ICD-10-CM

## 2011-05-01 DIAGNOSIS — M1990 Unspecified osteoarthritis, unspecified site: Secondary | ICD-10-CM | POA: Insufficient documentation

## 2011-05-01 DIAGNOSIS — E785 Hyperlipidemia, unspecified: Secondary | ICD-10-CM | POA: Insufficient documentation

## 2011-05-01 DIAGNOSIS — D72829 Elevated white blood cell count, unspecified: Secondary | ICD-10-CM | POA: Insufficient documentation

## 2011-05-01 DIAGNOSIS — Z6841 Body Mass Index (BMI) 40.0 and over, adult: Secondary | ICD-10-CM | POA: Insufficient documentation

## 2011-05-01 DIAGNOSIS — Z87891 Personal history of nicotine dependence: Secondary | ICD-10-CM | POA: Insufficient documentation

## 2011-05-01 DIAGNOSIS — R7309 Other abnormal glucose: Secondary | ICD-10-CM | POA: Insufficient documentation

## 2011-05-01 DIAGNOSIS — R231 Pallor: Secondary | ICD-10-CM | POA: Insufficient documentation

## 2011-05-01 DIAGNOSIS — G4730 Sleep apnea, unspecified: Secondary | ICD-10-CM | POA: Insufficient documentation

## 2011-05-01 DIAGNOSIS — R000 Tachycardia, unspecified: Secondary | ICD-10-CM | POA: Insufficient documentation

## 2011-05-01 DIAGNOSIS — I10 Essential (primary) hypertension: Secondary | ICD-10-CM | POA: Insufficient documentation

## 2011-05-01 DIAGNOSIS — F411 Generalized anxiety disorder: Secondary | ICD-10-CM | POA: Insufficient documentation

## 2011-05-01 DIAGNOSIS — R5381 Other malaise: Secondary | ICD-10-CM | POA: Insufficient documentation

## 2011-05-01 DIAGNOSIS — R112 Nausea with vomiting, unspecified: Secondary | ICD-10-CM | POA: Insufficient documentation

## 2011-05-01 DIAGNOSIS — R197 Diarrhea, unspecified: Secondary | ICD-10-CM | POA: Insufficient documentation

## 2011-05-01 DIAGNOSIS — I509 Heart failure, unspecified: Secondary | ICD-10-CM | POA: Insufficient documentation

## 2011-05-01 DIAGNOSIS — K219 Gastro-esophageal reflux disease without esophagitis: Secondary | ICD-10-CM | POA: Insufficient documentation

## 2011-05-01 LAB — DIFFERENTIAL
Lymphs Abs: 0.3 10*3/uL — ABNORMAL LOW (ref 0.7–4.0)
Monocytes Relative: 6 % (ref 3–12)
Neutro Abs: 10.7 10*3/uL — ABNORMAL HIGH (ref 1.7–7.7)
Neutrophils Relative %: 92 % — ABNORMAL HIGH (ref 43–77)

## 2011-05-01 LAB — BASIC METABOLIC PANEL
CO2: 23 mEq/L (ref 19–32)
Chloride: 106 mEq/L (ref 96–112)
Potassium: 4.1 mEq/L (ref 3.5–5.1)
Sodium: 139 mEq/L (ref 135–145)

## 2011-05-01 LAB — CBC
Hemoglobin: 14 g/dL (ref 12.0–15.0)
MCH: 30.3 pg (ref 26.0–34.0)
RBC: 4.62 MIL/uL (ref 3.87–5.11)

## 2011-05-01 LAB — PROTIME-INR: INR: 1.86 — ABNORMAL HIGH (ref 0.00–1.49)

## 2011-05-01 MED ORDER — SODIUM CHLORIDE 0.9 % IV SOLN: INTRAVENOUS | Status: DC

## 2011-05-01 MED ORDER — SODIUM CHLORIDE 0.9 % IV BOLUS (SEPSIS)
1000.0000 mL | INTRAVENOUS | Status: AC
  Administered 2011-05-01: 1000 mL via INTRAVENOUS

## 2011-05-01 MED ORDER — PROMETHAZINE HCL 25 MG RE SUPP: 25.0000 mg | Freq: Four times a day (QID) | RECTAL | Status: AC | PRN

## 2011-05-01 MED ORDER — ONDANSETRON HCL 4 MG/2ML IJ SOLN
4.0000 mg | Freq: Once | INTRAMUSCULAR | Status: AC
  Administered 2011-05-01: 4 mg via INTRAVENOUS
  Filled 2011-05-01: qty 2

## 2011-05-01 MED ORDER — LOPERAMIDE HCL 2 MG PO CAPS
4.0000 mg | ORAL_CAPSULE | Freq: Once | ORAL | Status: AC
  Administered 2011-05-01: 4 mg via ORAL
  Filled 2011-05-01: qty 2

## 2011-05-01 NOTE — ED Provider Notes (Signed)
History     CSN: [id number]  Arrival date & time 05/01/11  2595   First MD Initiated Contact with Patient 05/01/11 [phone]      Chief Complaint  Patient presents with  . Nausea    (Consider location/radiation/quality/duration/timing/severity/associated sxs/prior treatment) HPI  Patient relates acutely at 1 AM this morning she started having vomiting with simultaneous diarrhea. She states she's had both over 10 times. She denies seeing any blood and she denies abdominal pain. She states  her last episode was about 7:15 this morning. She relates however when she stands up she feels faint and she feels very weak. She denies fever. She denies any sick contacts. She denies any recent antibiotic use. She states she ate some cantaloupe it was a little bit slimy and she grimaced and off and then she ate some more cantaloupe that she had to an section herself.  CP angel Jones Cardiologist Dr. Johney Frame  Past Medical History  Diagnosis Date  . Hypertension   . Hyperlipidemia   . GERD (gastroesophageal reflux disease)   . Atrial fibrillation      post-termination pauses with afib s/p PPM  . Degenerative joint disease   . Acute diastolic heart failure   . Sinoatrial node dysfunction     s/p PPM  . Congestive heart failure, unspecified   . Unspecified hypothyroidism   . Anxiety state, unspecified   . Unspecified venous (peripheral) insufficiency   . Obesity   . Sleep apnea     compliant with CPAP    Past Surgical History  Procedure Date  . Pacemaker insertion     SJM by JA    History reviewed. No pertinent family history.  History  Substance Use Topics  . Smoking status: Former Smoker    Quit date: 03/29/1978  . Smokeless tobacco: Not on file  . Alcohol Use: No   lives at home with her son Patient works as a Comptroller at night  OB History    Grav Para Term Preterm Abortions TAB SAB Ect Mult Living                  Review of Systems  All other systems reviewed and are  negative.    Allergies  Review of patient's allergies indicates no known allergies.  Home Medications   Current Outpatient Rx  Name Route Sig Dispense Refill  . ALPRAZOLAM 0.25 MG PO TABS Oral Take 0.25 mg by mouth at bedtime as needed.      . ATORVASTATIN CALCIUM 40 MG PO TABS Oral Take 40 mg by mouth daily.      Marland Kitchen ESOMEPRAZOLE MAGNESIUM 40 MG PO CPDR Oral Take 40 mg by mouth daily before breakfast.      . FUROSEMIDE 20 MG PO TABS Oral Take 20 mg by mouth as needed.      Marland Kitchen LEVOTHYROXINE SODIUM 150 MCG PO TABS Oral Take 150 mcg by mouth daily.      Marland Kitchen METOPROLOL TARTRATE 100 MG PO TABS       . POTASSIUM CHLORIDE 10 MEQ PO TBCR Oral Take 10 mEq by mouth as needed.      . TRIAMCINOLONE ACETONIDE 55 MCG/ACT NA INHA Nasal 2 sprays by Nasal route daily.      . WARFARIN SODIUM 3 MG PO TABS  Take as directed by Anticoagulation clinic.  Pt takes up to 1 1/2 tablets daily. 40 tablet 3    BP 133/55  Pulse 68  Temp(Src) 97.7 F (36.5 C) (Oral)  Resp 20  Ht 5\' 2"  (1.575 m)  Wt 260 lb (117.935 kg)  BMI 47.55 kg/m2  SpO2 98%  Vital signs normal    Physical Exam  Nursing note and vitals reviewed. Constitutional: She is oriented to person, place, and time. She appears well-developed and well-nourished.  Non-toxic appearance. She does not appear ill. No distress.  HENT:  Head: Normocephalic and atraumatic.  Right Ear: External ear normal.  Left Ear: External ear normal.  Nose: Nose normal. No mucosal edema or rhinorrhea.  Mouth/Throat: Mucous membranes are normal. No dental abscesses or uvula swelling.       Mucous membranes are dry  Eyes: Conjunctivae and EOM are normal. Pupils are equal, round, and reactive to light.  Neck: Normal range of motion and full passive range of motion without pain. Neck supple.  Cardiovascular: Normal heart sounds.  An irregular rhythm present. Tachycardia present.  Exam reveals no gallop and no friction rub.   No murmur heard. Pulmonary/Chest: Effort  normal and breath sounds normal. No respiratory distress. She has no wheezes. She has no rhonchi. She has no rales. She exhibits no tenderness and no crepitus.  Abdominal: Soft. Normal appearance and bowel sounds are normal. She exhibits no distension. There is no tenderness. There is no rebound and no guarding.       Her abdomen is obese and soft  Musculoskeletal: Normal range of motion. She exhibits no edema and no tenderness.       Moves all extremities well.   Neurological: She is alert and oriented to person, place, and time. She has normal strength. No cranial nerve deficit.  Skin: Skin is warm, dry and intact. No rash noted. No erythema. There is pallor.  Psychiatric: She has a normal mood and affect. Her speech is normal and behavior is normal. Her mood appears not anxious.    ED Course  Procedures (including critical care time)  Pt given IV pain and nausea meds. She was given imodium for diarrhea.  Pt feeling better, was able to take oral fluids without having vomiting or diarrhea.  Pt color is good, she is ambulatory and feels ready to go home.     Results for orders placed during the hospital encounter of 05/01/11  CBC      Component Value Range   WBC 11.7 (*) 4.0 - 10.5 (K/uL)   RBC 4.62  3.87 - 5.11 (MIL/uL)   Hemoglobin 14.0  12.0 - 15.0 (g/dL)   HCT 16.1  09.6 - 04.5 (%)   MCV 93.9  78.0 - 100.0 (fL)   MCH 30.3  26.0 - 34.0 (pg)   MCHC 32.3  30.0 - 36.0 (g/dL)   RDW 40.9  81.1 - 91.4 (%)   Platelets 179  150 - 400 (K/uL)  DIFFERENTIAL      Component Value Range   Neutrophils Relative 92 (*) 43 - 77 (%)   Neutro Abs 10.7 (*) 1.7 - 7.7 (K/uL)   Lymphocytes Relative 2 (*) 12 - 46 (%)   Lymphs Abs 0.3 (*) 0.7 - 4.0 (K/uL)   Monocytes Relative 6  3 - 12 (%)   Monocytes Absolute 0.7  0.1 - 1.0 (K/uL)   Eosinophils Relative 0  0 - 5 (%)   Eosinophils Absolute 0.0  0.0 - 0.7 (K/uL)   Basophils Relative 0  0 - 1 (%)   Basophils Absolute 0.0  0.0 - 0.1 (K/uL)    PROTIME-INR      Component Value Range   Prothrombin  Time 21.8 (*) 11.6 - 15.2 (seconds)   INR 1.86 (*) 0.00 - 1.49   BASIC METABOLIC PANEL      Component Value Range   Sodium 139  135 - 145 (mEq/L)   Potassium 4.1  3.5 - 5.1 (mEq/L)   Chloride 106  96 - 112 (mEq/L)   CO2 23  19 - 32 (mEq/L)   Glucose, Bld 144 (*) 70 - 99 (mg/dL)   BUN 22  6 - 23 (mg/dL)   Creatinine, Ser 1.61  0.50 - 1.10 (mg/dL)   Calcium 9.0  8.4 - 09.6 (mg/dL)   GFR calc non Af Amer 67 (*) >90 (mL/min)   GFR calc Af Amer 78 (*) >90 (mL/min)   Laboratory interpretation all normal except mild leukocytosis , Mild hyperglycemia, subtherapeutic Coumadin level  .     Date: 05/01/2011  Rate: 110  Rhythm: atrial fibrillation  QRS Axis: normal  Intervals: normal  ST/T Wave abnormalities: nonspecific T wave changes  Conduction Disutrbances:none  Narrative Interpretation: low voltage QRS  Old EKG Reviewed: unchanged from 10/23/2008   Diagnoses that have been ruled out:  None  Diagnoses that are still under consideration:  None  Final diagnoses:  Vomiting and diarrhea  Dehydration   New Prescriptions   PROMETHAZINE (PHENERGAN) 25 MG SUPPOSITORY    Place 1 suppository (25 mg total) rectally every 6 (six) hours as needed for nausea.   Plan discharge  Devoria Albe, MD, FACEP      MDM          Ward Givens, MD 05/01/11 [phone]

## 2011-05-01 NOTE — ED Notes (Signed)
Patient is tolerating PO fluids at this time, will continue to monitor

## 2011-05-01 NOTE — ED Notes (Signed)
Complain of n/v/d since 0100. States the last time she vomited was around 0700

## 2011-05-07 ENCOUNTER — Ambulatory Visit (INDEPENDENT_AMBULATORY_CARE_PROVIDER_SITE_OTHER): Payer: Medicare Other | Admitting: *Deleted

## 2011-05-07 ENCOUNTER — Ambulatory Visit (INDEPENDENT_AMBULATORY_CARE_PROVIDER_SITE_OTHER): Payer: Medicare Other | Admitting: Internal Medicine

## 2011-05-07 ENCOUNTER — Encounter: Payer: Self-pay | Admitting: Internal Medicine

## 2011-05-07 DIAGNOSIS — I4891 Unspecified atrial fibrillation: Secondary | ICD-10-CM

## 2011-05-07 DIAGNOSIS — I10 Essential (primary) hypertension: Secondary | ICD-10-CM

## 2011-05-07 DIAGNOSIS — Z7901 Long term (current) use of anticoagulants: Secondary | ICD-10-CM

## 2011-05-07 DIAGNOSIS — I495 Sick sinus syndrome: Secondary | ICD-10-CM

## 2011-05-07 DIAGNOSIS — R0602 Shortness of breath: Secondary | ICD-10-CM

## 2011-05-07 DIAGNOSIS — Z950 Presence of cardiac pacemaker: Secondary | ICD-10-CM

## 2011-05-07 LAB — PACEMAKER DEVICE OBSERVATION: BRDY-0002RV: 60 {beats}/min

## 2011-05-07 LAB — POCT INR: INR: 3.4

## 2011-05-07 MED ORDER — DILTIAZEM HCL ER COATED BEADS 240 MG PO TB24: 240.0000 mg | ORAL_TABLET | Freq: Every day | ORAL | Status: DC

## 2011-05-07 NOTE — Assessment & Plan Note (Addendum)
Stable No change required today  Repeat echo to evaluate worsening SOB.

## 2011-05-07 NOTE — Assessment & Plan Note (Signed)
V rates are much more elevated today I will add diltiazem CD 240mg  daily Goal INR 2-3  Return in 3 months for follow-up on rate control

## 2011-05-07 NOTE — Patient Instructions (Addendum)
Follow up as scheduled with Dr. Johney Frame in 3 months. Start Cardizem (diltiazem) 240 mg daily. Your physician has requested that you have an echocardiogram. Echocardiography is a painless test that uses sound waves to create images of your heart. It provides your doctor with information about the size and shape of your heart and how well your heart's chambers and valves are working. This procedure takes approximately one hour. There are no restrictions for this procedure.

## 2011-05-07 NOTE — Progress Notes (Signed)
The patient presents today for routine electrophysiology followup.  Since last being seen in our clinic, the patient reports doing reasonably well.  She continues to have difficulty with weight loss.  She has stable breathlessness with moderate activity.  Today, she denies symptoms of palpitations, chest pain, orthopnea, PND, lower extremity edema, dizziness, presyncope, syncope, or neurologic sequela.  The patient feels that she is tolerating medications without difficulties and is otherwise without complaint today.   Past Medical History  Diagnosis Date  . Hypertension   . Hyperlipidemia   . GERD (gastroesophageal reflux disease)   . Persistent atrial fibrillation      post-termination pauses with afib s/p PPM  . Degenerative joint disease   . Acute diastolic heart failure   . Sinoatrial node dysfunction     s/p PPM  . Congestive heart failure, unspecified   . Unspecified hypothyroidism   . Anxiety state, unspecified   . Unspecified venous (peripheral) insufficiency   . Obesity   . Sleep apnea     compliant with CPAP   Past Surgical History  Procedure Date  . Pacemaker insertion     SJM by JA    Current Outpatient Prescriptions  Medication Sig Dispense Refill  . ALPRAZolam (XANAX) 0.25 MG tablet Take 0.25 mg by mouth at bedtime as needed.        Marland Kitchen atorvastatin (LIPITOR) 40 MG tablet Take 40 mg by mouth at bedtime.       . cholecalciferol (VITAMIN D) 1000 UNITS tablet Take 2,000 Units by mouth daily.      Marland Kitchen esomeprazole (NEXIUM) 40 MG capsule Take 40 mg by mouth daily before breakfast.        . furosemide (LASIX) 20 MG tablet Take 20 mg by mouth as needed.        Marland Kitchen levothyroxine (SYNTHROID, LEVOTHROID) 150 MCG tablet Take 150 mcg by mouth daily.        . metoprolol (LOPRESSOR) 100 MG tablet 100 mg 2 (two) times daily.       Marland Kitchen warfarin (COUMADIN) 3 MG tablet Take 4.5 mg by mouth at bedtime. Patient takes this dosage every night, except Mondays and Fridays      . warfarin  (COUMADIN) 3 MG tablet Take 3 mg by mouth at bedtime. Take as directed by Anticoagulation clinic.  Pt takes up to 1 1/2 tablets daily. Patient takes on Mondays and Friday nights      . diltiazem (CARDIZEM LA) 240 MG 24 hr tablet Take 1 tablet (240 mg total) by mouth daily.  30 tablet  6  . potassium chloride (KLOR-CON) 10 MEQ CR tablet Take 10 mEq by mouth as needed.        . promethazine (PHENERGAN) 25 MG suppository Place 1 suppository (25 mg total) rectally every 6 (six) hours as needed for nausea.  12 each  0    No Known Allergies  History   Social History  . Marital Status: Widowed    Spouse Name: N/A    Number of Children: N/A  . Years of Education: N/A   Occupational History  . RETIRED    Social History Main Topics  . Smoking status: Former Smoker    Quit date: 03/29/1978  . Smokeless tobacco: Not on file  . Alcohol Use: No  . Drug Use: No  . Sexually Active: Not on file   Other Topics Concern  . Not on file   Social History Narrative  . No narrative on file   Physical Exam:  Filed Vitals:   05/07/11 1055  BP: 139/82  Pulse: 75  Height: 5\' 2"  (1.575 m)  Weight: 271 lb (122.925 kg)    GEN- The patient is overweight, alert and oriented x 3 today.   Head- normocephalic, atraumatic Eyes-  Sclera clear, conjunctiva pink Ears- hearing intact Oropharynx- clear Neck- supple, no JVP Lymph- no cervical lymphadenopathy Lungs- Clear to ausculation bilaterally, normal work of breathing Chest- pacemaker pocket is well healed Heart- irregular rate and rhythm, no murmurs, rubs or gallops, PMI not laterally displaced GI- soft, NT, ND, + BS Extremities- no clubbing, cyanosis, or edema MS- no significant deformity or atrophy Skin- no rash or lesion Psych- euthymic mood, full affect Neuro- strength and sensation are intact  Pacemaker interrogation- reviewed in detail today,  See PACEART report  Assessment and Plan:

## 2011-05-07 NOTE — Assessment & Plan Note (Signed)
Normal pacemaker function See Pace Art report No changes today  

## 2011-05-14 ENCOUNTER — Telehealth: Payer: Self-pay | Admitting: Internal Medicine

## 2011-05-14 NOTE — Telephone Encounter (Signed)
New Problem:     Patient called in because she took one of her diltiazem (CARDIZEM LA) 240 MG 24 hr tablet and said that her blood pressure dropped down to 80 and she feels awful.  Now she does not know whether to take another one or not, because she does not want her BP to drop any lower.  Please call back.

## 2011-05-14 NOTE — Telephone Encounter (Signed)
Spoke with pt, she took the first dosage of diltiazem today because the pharm had to order it for her. She reports her bp is 36 and she feels very bad. She does have the tablets and will try cutting the diltiazem in 1/2 to see if that helps with her symptoms. She also feels she maybe dehydrated. Explained to pt that will also make her bp drop and increase her heart rate. She will increase her fluid intake and try the diltiazem 120 mg and call with cont symptoms.

## 2011-06-02 ENCOUNTER — Other Ambulatory Visit: Payer: Self-pay

## 2011-06-02 ENCOUNTER — Other Ambulatory Visit (INDEPENDENT_AMBULATORY_CARE_PROVIDER_SITE_OTHER): Payer: Medicare Other

## 2011-06-02 DIAGNOSIS — I495 Sick sinus syndrome: Secondary | ICD-10-CM

## 2011-06-02 DIAGNOSIS — I4891 Unspecified atrial fibrillation: Secondary | ICD-10-CM

## 2011-06-02 DIAGNOSIS — R0602 Shortness of breath: Secondary | ICD-10-CM

## 2011-06-04 ENCOUNTER — Encounter: Payer: Self-pay | Admitting: *Deleted

## 2011-06-15 ENCOUNTER — Ambulatory Visit (INDEPENDENT_AMBULATORY_CARE_PROVIDER_SITE_OTHER): Payer: Medicare Other | Admitting: *Deleted

## 2011-06-15 DIAGNOSIS — Z7901 Long term (current) use of anticoagulants: Secondary | ICD-10-CM

## 2011-06-15 DIAGNOSIS — I4891 Unspecified atrial fibrillation: Secondary | ICD-10-CM

## 2011-06-28 ENCOUNTER — Telehealth: Payer: Self-pay | Admitting: *Deleted

## 2011-06-28 NOTE — Telephone Encounter (Signed)
Pt left message on voicemail asking for a return call regarding diltiazem.  Spoke with pt who states she is taking 240 mg. She has been getting Matzim LA. She states this costs about $99. She wanted to know if she could get 120 mg called in that is on the $4 list. Pt aware the diltiazem 120 mg that is on the $4 list is for 30 tablets and she would need 60. Pt will call [facility] pharmacy and ask if it would be cost effect to change to the 120 mg or if she can get the 240 mg capsules in generic.

## 2011-06-29 NOTE — Telephone Encounter (Signed)
Pt spoke with pharmacist who states the 120 mg tablets would be about $14 but she will have to take this more than once a day. She states her primary MD has some kind of program that could help her save on the cost. She will speak with them about this program. If she still is unable to afford medication, she will contact our office back.

## 2011-07-05 NOTE — Telephone Encounter (Signed)
Pt states she is going to get help from the [employer]. [name] w/Dr. Dr. Charm Barges will help get diltiazem.

## 2011-07-06 ENCOUNTER — Telehealth: Payer: Self-pay | Admitting: *Deleted

## 2011-07-06 NOTE — Telephone Encounter (Signed)
Pt left message on voicemail asking for a return call regarding swelling. She states she is having swelling in her feet/ankles and some in her legs. She thinks they have been swollen for a while. She is not sure if the Cardizem may be causing this swelling as she thinks she had the swelling before she started Cardizem.   Spoke with pt who states she's always had swelling in (B) feet/legs but not as much as she has now. She tries to keep her feet elevated but this doesn't seem to be helping. In the past when she would elevate them, the swelling would go down but nothing seems to help now. She read that this is a side effect of Cardizem. Pt aware that note will be sent to Dr. Johney Frame for further review.

## 2011-07-07 NOTE — Telephone Encounter (Signed)
Given her significantly elevated ventricular rates, she should not stop cardizem. Encourage 2 gram sodium diet and have her follow-up with someone within 1 week. Fayrene Fearing Renald Haithcock,MD

## 2011-07-09 ENCOUNTER — Ambulatory Visit (INDEPENDENT_AMBULATORY_CARE_PROVIDER_SITE_OTHER): Payer: Medicare Other | Admitting: *Deleted

## 2011-07-09 DIAGNOSIS — Z7901 Long term (current) use of anticoagulants: Secondary | ICD-10-CM

## 2011-07-09 DIAGNOSIS — I4891 Unspecified atrial fibrillation: Secondary | ICD-10-CM

## 2011-07-23 ENCOUNTER — Ambulatory Visit (INDEPENDENT_AMBULATORY_CARE_PROVIDER_SITE_OTHER): Payer: Medicare Other | Admitting: Cardiology

## 2011-07-23 DIAGNOSIS — I4891 Unspecified atrial fibrillation: Secondary | ICD-10-CM

## 2011-07-23 DIAGNOSIS — Z7901 Long term (current) use of anticoagulants: Secondary | ICD-10-CM

## 2011-07-29 NOTE — Telephone Encounter (Signed)
Left message to call back on voice mail

## 2011-07-29 NOTE — Telephone Encounter (Signed)
Pt notified per Dr. Jenel Lucks recommendation. She states she will discuss w/Dr. Johney Frame at 5/22 OV.

## 2011-08-18 ENCOUNTER — Ambulatory Visit (INDEPENDENT_AMBULATORY_CARE_PROVIDER_SITE_OTHER): Payer: Medicare Other | Admitting: Internal Medicine

## 2011-08-18 ENCOUNTER — Encounter: Payer: Self-pay | Admitting: Internal Medicine

## 2011-08-18 VITALS — BP 119/75 | HR 86 | Resp 18 | Ht 62.0 in | Wt 273.0 lb

## 2011-08-18 DIAGNOSIS — G4730 Sleep apnea, unspecified: Secondary | ICD-10-CM

## 2011-08-18 DIAGNOSIS — E669 Obesity, unspecified: Secondary | ICD-10-CM

## 2011-08-18 DIAGNOSIS — I4891 Unspecified atrial fibrillation: Secondary | ICD-10-CM

## 2011-08-18 DIAGNOSIS — I10 Essential (primary) hypertension: Secondary | ICD-10-CM

## 2011-08-18 DIAGNOSIS — I495 Sick sinus syndrome: Secondary | ICD-10-CM

## 2011-08-18 LAB — PACEMAKER DEVICE OBSERVATION
BATTERY VOLTAGE: 2.9629 V
BRDY-0002RV: 60 {beats}/min
BRDY-0004RV: 120 {beats}/min
DEVICE MODEL PM: 2339600
RV LEAD AMPLITUDE: 11.4 mv
RV LEAD IMPEDENCE PM: 437.5 Ohm
RV LEAD THRESHOLD: 0.75 V
VENTRICULAR PACING PM: 11

## 2011-08-18 NOTE — Progress Notes (Signed)
PCP: BUTLER,CYNTHIA, DO, DO  The patient presents today for routine electrophysiology followup.  Since last being seen in our clinic, the patient reports doing reasonably well.  Her heart rates with afib are much improved.  She is not very active and is making no headway with weight loss.  She has actually gained 2 lbs since her last visit.   Today, she denies symptoms of palpitations, chest pain, orthopnea, PND, lower extremity edema, dizziness, presyncope, syncope, or neurologic sequela.  The patient feels that she is tolerating medications without difficulties and is otherwise without complaint today.   Past Medical History  Diagnosis Date  . Hypertension   . Hyperlipidemia   . GERD (gastroesophageal reflux disease)   . Persistent atrial fibrillation      post-termination pauses with afib s/p PPM  . Degenerative joint disease   . Acute diastolic heart failure   . Sinoatrial node dysfunction     s/p PPM  . Congestive heart failure, unspecified   . Unspecified hypothyroidism   . Anxiety state, unspecified   . Unspecified venous (peripheral) insufficiency   . Obesity   . Sleep apnea     compliant with CPAP   Past Surgical History  Procedure Date  . Pacemaker insertion     SJM by JA    Current Outpatient Prescriptions  Medication Sig Dispense Refill  . ALPRAZolam (XANAX) 0.25 MG tablet Take 0.25 mg by mouth at bedtime as needed.        Marland Kitchen atorvastatin (LIPITOR) 40 MG tablet Take 40 mg by mouth at bedtime.       . cholecalciferol (VITAMIN D) 1000 UNITS tablet Take 2,000 Units by mouth daily.      Marland Kitchen diltiazem (CARDIZEM LA) 240 MG 24 hr tablet Take 1 tablet (240 mg total) by mouth daily.  30 tablet  6  . esomeprazole (NEXIUM) 40 MG capsule Take 40 mg by mouth daily before breakfast.        . furosemide (LASIX) 20 MG tablet Take 20 mg by mouth as needed.        Marland Kitchen levothyroxine (SYNTHROID, LEVOTHROID) 150 MCG tablet Take 150 mcg by mouth daily.        . metoprolol (LOPRESSOR) 100 MG  tablet 100 mg 2 (two) times daily.       . potassium chloride (KLOR-CON) 10 MEQ CR tablet Take 10 mEq by mouth as needed.        . warfarin (COUMADIN) 3 MG tablet Take 4.5 mg by mouth at bedtime. Patient takes this dosage every night, except Mondays and Fridays      . DISCONTD: warfarin (COUMADIN) 3 MG tablet Take 3 mg by mouth at bedtime. Take as directed by Anticoagulation clinic.  Pt takes up to 1 1/2 tablets daily. Patient takes on Mondays and Friday nights        No Known Allergies  History   Social History  . Marital Status: Widowed    Spouse Name: N/A    Number of Children: N/A  . Years of Education: N/A   Occupational History  . RETIRED    Social History Main Topics  . Smoking status: Former Smoker    Quit date: 03/29/1978  . Smokeless tobacco: Not on file  . Alcohol Use: No  . Drug Use: No  . Sexually Active: Not on file   Other Topics Concern  . Not on file   Social History Narrative  . No narrative on file   Physical Exam: Filed Vitals:  08/18/11 1002  BP: 119/75  Pulse: 86  Resp: 18  Height: 5\' 2"  (1.575 m)  Weight: 273 lb (123.832 kg)    GEN- The patient is overweight, alert and oriented x 3 today.   Head- normocephalic, atraumatic Eyes-  Sclera clear, conjunctiva pink Ears- hearing intact Oropharynx- clear Neck- supple, no JVP Lymph- no cervical lymphadenopathy Lungs- Clear to ausculation bilaterally, normal work of breathing Chest- pacemaker pocket is well healed Heart- irregular rate and rhythm, no murmurs, rubs or gallops, PMI not laterally displaced GI- soft, NT, ND, + BS Extremities- no clubbing, cyanosis, or edema MS- no significant deformity or atrophy Skin- no rash or lesion Psych- euthymic mood, full affect Neuro- strength and sensation are intact  Pacemaker interrogation- reviewed in detail today,  See PACEART report  Assessment and Plan:

## 2011-08-18 NOTE — Assessment & Plan Note (Signed)
Compliant with CPAP 

## 2011-08-18 NOTE — Assessment & Plan Note (Signed)
Stable No change required today  

## 2011-08-18 NOTE — Assessment & Plan Note (Signed)
Persistent afib Goal INR 2-3 V rates are much improved Continue current medical therapy

## 2011-08-18 NOTE — Assessment & Plan Note (Signed)
Normal pacemaker function See Pace Art report No changes today  

## 2011-08-18 NOTE — Patient Instructions (Signed)
Device check in 6 months.  Follow up with [facility] in 1 year.  Your physician recommends that you continue on your current medications as directed. Please refer to the Current Medication list given to you today.

## 2011-08-18 NOTE — Assessment & Plan Note (Signed)
I had a long discussion today about her weight.  Unfortunately, she has not been amenable to lifestyle modification.  Regular exercise and various aspects of dieting including reduction of calories and carbs were discussed today.  I am not certain that she has any interests in weight reduction at this time.

## 2011-08-20 ENCOUNTER — Ambulatory Visit (INDEPENDENT_AMBULATORY_CARE_PROVIDER_SITE_OTHER): Payer: Medicare Other | Admitting: *Deleted

## 2011-08-20 DIAGNOSIS — I4891 Unspecified atrial fibrillation: Secondary | ICD-10-CM

## 2011-08-20 DIAGNOSIS — Z7901 Long term (current) use of anticoagulants: Secondary | ICD-10-CM

## 2011-08-20 LAB — POCT INR: INR: 3.2

## 2011-08-24 ENCOUNTER — Other Ambulatory Visit: Payer: Self-pay | Admitting: *Deleted

## 2011-08-24 MED ORDER — WARFARIN SODIUM 3 MG PO TABS: ORAL_TABLET | ORAL | Status: DC

## 2011-09-17 ENCOUNTER — Ambulatory Visit (INDEPENDENT_AMBULATORY_CARE_PROVIDER_SITE_OTHER): Payer: Medicare Other | Admitting: *Deleted

## 2011-09-17 DIAGNOSIS — I4891 Unspecified atrial fibrillation: Secondary | ICD-10-CM

## 2011-09-17 DIAGNOSIS — Z7901 Long term (current) use of anticoagulants: Secondary | ICD-10-CM

## 2011-09-17 LAB — POCT INR: INR: 2.7

## 2011-10-15 ENCOUNTER — Ambulatory Visit (INDEPENDENT_AMBULATORY_CARE_PROVIDER_SITE_OTHER): Payer: Medicare Other | Admitting: *Deleted

## 2011-10-15 DIAGNOSIS — I4891 Unspecified atrial fibrillation: Secondary | ICD-10-CM

## 2011-10-15 DIAGNOSIS — Z7901 Long term (current) use of anticoagulants: Secondary | ICD-10-CM

## 2011-11-12 ENCOUNTER — Ambulatory Visit (INDEPENDENT_AMBULATORY_CARE_PROVIDER_SITE_OTHER): Payer: Medicare Other | Admitting: *Deleted

## 2011-11-12 DIAGNOSIS — Z7901 Long term (current) use of anticoagulants: Secondary | ICD-10-CM

## 2011-11-12 DIAGNOSIS — I4891 Unspecified atrial fibrillation: Secondary | ICD-10-CM

## 2011-11-19 ENCOUNTER — Other Ambulatory Visit: Payer: Self-pay | Admitting: Internal Medicine

## 2011-12-10 ENCOUNTER — Ambulatory Visit (INDEPENDENT_AMBULATORY_CARE_PROVIDER_SITE_OTHER): Payer: Medicare Other | Admitting: *Deleted

## 2011-12-10 DIAGNOSIS — I4891 Unspecified atrial fibrillation: Secondary | ICD-10-CM

## 2011-12-10 DIAGNOSIS — Z7901 Long term (current) use of anticoagulants: Secondary | ICD-10-CM

## 2011-12-10 LAB — POCT INR: INR: 2.4

## 2011-12-15 DIAGNOSIS — R0602 Shortness of breath: Secondary | ICD-10-CM

## 2011-12-16 ENCOUNTER — Encounter: Payer: Self-pay | Admitting: Physician Assistant

## 2011-12-16 DIAGNOSIS — I4891 Unspecified atrial fibrillation: Secondary | ICD-10-CM

## 2011-12-22 ENCOUNTER — Encounter: Payer: Self-pay | Admitting: *Deleted

## 2011-12-22 ENCOUNTER — Telehealth: Payer: Self-pay | Admitting: *Deleted

## 2011-12-22 MED ORDER — DILTIAZEM HCL ER COATED BEADS 120 MG PO CP24: 360.0000 mg | ORAL_CAPSULE | Freq: Every day | ORAL | Status: DC

## 2011-12-22 NOTE — Telephone Encounter (Signed)
Spoke with patient r/e her diltiazem 360 rx that's too expensive and patient want MD to give different dose in order to get assistance through health department.

## 2011-12-22 NOTE — Telephone Encounter (Signed)
Patient said her dose changed to 360 mg when d/c from [facility] 12/18/11 and couldn't afford the 360 mg prescription. Patient is requesting a 90 day supply of diltiazem cd 120 mg taking 3 daily to equal the 360 mg. This would cost her $40 instead $145. Patient is requesting that we fax prescription to Blima Singer at [facility].

## 2012-01-07 ENCOUNTER — Ambulatory Visit (INDEPENDENT_AMBULATORY_CARE_PROVIDER_SITE_OTHER): Payer: Medicare Other | Admitting: *Deleted

## 2012-01-07 DIAGNOSIS — I4891 Unspecified atrial fibrillation: Secondary | ICD-10-CM

## 2012-01-07 DIAGNOSIS — Z7901 Long term (current) use of anticoagulants: Secondary | ICD-10-CM

## 2012-01-20 ENCOUNTER — Other Ambulatory Visit: Payer: Self-pay | Admitting: *Deleted

## 2012-01-20 MED ORDER — WARFARIN SODIUM 3 MG PO TABS: ORAL_TABLET | ORAL | Status: DC

## 2012-01-24 ENCOUNTER — Other Ambulatory Visit: Payer: Self-pay | Admitting: *Deleted

## 2012-01-24 MED ORDER — WARFARIN SODIUM 3 MG PO TABS: ORAL_TABLET | ORAL | Status: DC

## 2012-02-04 ENCOUNTER — Ambulatory Visit (INDEPENDENT_AMBULATORY_CARE_PROVIDER_SITE_OTHER): Payer: Medicare Other | Admitting: *Deleted

## 2012-02-04 ENCOUNTER — Encounter: Payer: Self-pay | Admitting: *Deleted

## 2012-02-04 DIAGNOSIS — I4891 Unspecified atrial fibrillation: Secondary | ICD-10-CM

## 2012-02-04 DIAGNOSIS — Z7901 Long term (current) use of anticoagulants: Secondary | ICD-10-CM

## 2012-02-10 ENCOUNTER — Encounter: Payer: Self-pay | Admitting: Internal Medicine

## 2012-02-10 ENCOUNTER — Ambulatory Visit (INDEPENDENT_AMBULATORY_CARE_PROVIDER_SITE_OTHER): Payer: Medicare Other | Admitting: *Deleted

## 2012-02-10 DIAGNOSIS — I4891 Unspecified atrial fibrillation: Secondary | ICD-10-CM

## 2012-02-10 DIAGNOSIS — I495 Sick sinus syndrome: Secondary | ICD-10-CM

## 2012-02-10 LAB — PACEMAKER DEVICE OBSERVATION
BATTERY VOLTAGE: 2.9779 V
DEVICE MODEL PM: 2339600
VENTRICULAR PACING PM: 8.4

## 2012-02-10 NOTE — Progress Notes (Signed)
Pacer check in clinic  

## 2012-03-03 ENCOUNTER — Ambulatory Visit (INDEPENDENT_AMBULATORY_CARE_PROVIDER_SITE_OTHER): Payer: Medicare Other | Admitting: *Deleted

## 2012-03-03 DIAGNOSIS — I4891 Unspecified atrial fibrillation: Secondary | ICD-10-CM

## 2012-03-03 DIAGNOSIS — Z7901 Long term (current) use of anticoagulants: Secondary | ICD-10-CM

## 2012-03-03 LAB — POCT INR: INR: 3.4

## 2012-03-31 ENCOUNTER — Ambulatory Visit (INDEPENDENT_AMBULATORY_CARE_PROVIDER_SITE_OTHER): Payer: Medicare Other | Admitting: *Deleted

## 2012-03-31 DIAGNOSIS — Z7901 Long term (current) use of anticoagulants: Secondary | ICD-10-CM

## 2012-03-31 DIAGNOSIS — I4891 Unspecified atrial fibrillation: Secondary | ICD-10-CM

## 2012-04-28 ENCOUNTER — Ambulatory Visit (INDEPENDENT_AMBULATORY_CARE_PROVIDER_SITE_OTHER): Payer: Medicare Other | Admitting: *Deleted

## 2012-04-28 DIAGNOSIS — I4891 Unspecified atrial fibrillation: Secondary | ICD-10-CM

## 2012-04-28 DIAGNOSIS — Z7901 Long term (current) use of anticoagulants: Secondary | ICD-10-CM

## 2012-05-26 ENCOUNTER — Ambulatory Visit (INDEPENDENT_AMBULATORY_CARE_PROVIDER_SITE_OTHER): Payer: Medicare Other | Admitting: *Deleted

## 2012-05-26 DIAGNOSIS — I4891 Unspecified atrial fibrillation: Secondary | ICD-10-CM

## 2012-05-26 DIAGNOSIS — Z7901 Long term (current) use of anticoagulants: Secondary | ICD-10-CM

## 2012-06-23 ENCOUNTER — Ambulatory Visit (INDEPENDENT_AMBULATORY_CARE_PROVIDER_SITE_OTHER): Payer: Medicare Other | Admitting: *Deleted

## 2012-06-23 DIAGNOSIS — I4891 Unspecified atrial fibrillation: Secondary | ICD-10-CM

## 2012-06-23 DIAGNOSIS — Z7901 Long term (current) use of anticoagulants: Secondary | ICD-10-CM

## 2012-06-23 LAB — POCT INR: INR: 2.7

## 2012-07-18 ENCOUNTER — Ambulatory Visit (INDEPENDENT_AMBULATORY_CARE_PROVIDER_SITE_OTHER): Payer: Medicare Other | Admitting: *Deleted

## 2012-07-18 DIAGNOSIS — I4891 Unspecified atrial fibrillation: Secondary | ICD-10-CM

## 2012-07-18 DIAGNOSIS — Z7901 Long term (current) use of anticoagulants: Secondary | ICD-10-CM

## 2012-07-18 LAB — POCT INR: INR: 2.5

## 2012-08-18 ENCOUNTER — Ambulatory Visit (INDEPENDENT_AMBULATORY_CARE_PROVIDER_SITE_OTHER): Payer: Medicare Other | Admitting: *Deleted

## 2012-08-18 DIAGNOSIS — I4891 Unspecified atrial fibrillation: Secondary | ICD-10-CM

## 2012-08-18 DIAGNOSIS — Z7901 Long term (current) use of anticoagulants: Secondary | ICD-10-CM

## 2012-08-18 LAB — POCT INR: INR: 3.9

## 2012-08-31 ENCOUNTER — Ambulatory Visit (INDEPENDENT_AMBULATORY_CARE_PROVIDER_SITE_OTHER): Payer: Medicare Other | Admitting: Internal Medicine

## 2012-08-31 VITALS — BP 122/77 | HR 102 | Ht 62.0 in | Wt 276.0 lb

## 2012-08-31 DIAGNOSIS — I4891 Unspecified atrial fibrillation: Secondary | ICD-10-CM

## 2012-08-31 DIAGNOSIS — Z950 Presence of cardiac pacemaker: Secondary | ICD-10-CM

## 2012-08-31 NOTE — Patient Instructions (Addendum)
Your physician wants you to follow-up in: 1 year with Dr Johney Frame in our [place] office.  You will receive a reminder letter in the mail two months in advance. If you don't receive a letter, please call our office to schedule the follow-up appointment.

## 2012-08-31 NOTE — Progress Notes (Signed)
PCP: Samuel Jester, DO  Megan [name] is a 67 y.o. female who presents today for routine electrophysiology followup.  Since last being seen in our clinic, the patient reports doing very well.  Today, she denies symptoms of palpitations, chest pain, shortness of breath,  lower extremity edema, dizziness, presyncope, or syncope.  The patient is otherwise without complaint today.   Past Medical History  Diagnosis Date  . Hypertension   . Hyperlipidemia   . GERD (gastroesophageal reflux disease)   . Persistent atrial fibrillation      post-termination pauses with afib s/p PPM  . Degenerative joint disease   . Acute diastolic heart failure   . Sinoatrial node dysfunction     s/p PPM  . Congestive heart failure, unspecified   . Unspecified hypothyroidism   . Anxiety state, unspecified   . Unspecified venous (peripheral) insufficiency   . Obesity   . Sleep apnea     compliant with CPAP   Past Surgical History  Procedure Laterality Date  . Pacemaker insertion      SJM by JA    Current Outpatient Prescriptions  Medication Sig Dispense Refill  . ALPRAZolam (XANAX) 0.25 MG tablet Take 0.25 mg by mouth at bedtime as needed.        Marland Kitchen atorvastatin (LIPITOR) 40 MG tablet Take 40 mg by mouth at bedtime.       . cholecalciferol (VITAMIN D) 1000 UNITS tablet Take 50,000 Units by mouth once a week. 50,000 weekly      . diltiazem (CARDIZEM CD) 120 MG 24 hr capsule Take 3 capsules (360 mg total) by mouth daily.  270 capsule  3  . esomeprazole (NEXIUM) 40 MG capsule Take 40 mg by mouth daily before breakfast.        . furosemide (LASIX) 20 MG tablet Take 20 mg by mouth as needed.        Marland Kitchen levothyroxine (SYNTHROID, LEVOTHROID) 150 MCG tablet Take 150 mcg by mouth daily.        . metoprolol (LOPRESSOR) 100 MG tablet TAKE ONE TABLET BY MOUTH TWICE DAILY  60 tablet  11  . warfarin (COUMADIN) 3 MG tablet Take as directed by Coumadin clinic  40 tablet  3   No current facility-administered  medications for this visit.    Physical Exam: Filed Vitals:   08/31/12 1450  BP: 122/77  Pulse: 102  Height: 5\' 2"  (1.575 m)  Weight: 276 lb (125.193 kg)    GEN- The patient is well appearing, alert and oriented x 3 today.   Head- normocephalic, atraumatic Eyes-  Sclera clear, conjunctiva pink Ears- hearing intact Oropharynx- clear Lungs- Clear to ausculation bilaterally, normal work of breathing Chest- pacemaker pocket is well healed Heart- irregular rate and rhythm  GI- soft, NT, ND, + BS Extremities- no clubbing, cyanosis, + edema  Pacemaker interrogation- reviewed in detail today,  See PACEART report  Assessment and Plan:  1. Permanent afib Stable Continue long term anticoagulation  2. Tachy/brady Normal pacemaker function See Pace Art report No changes today  3. Obesity Weight loss is strongly advised She is not ready to lose weight  4. Edema Likely due to venous insufficiency Support hose advised  5. HTN Stable No change required today  Return in 1 year

## 2012-09-08 ENCOUNTER — Ambulatory Visit (INDEPENDENT_AMBULATORY_CARE_PROVIDER_SITE_OTHER): Payer: Medicare Other | Admitting: *Deleted

## 2012-09-08 DIAGNOSIS — I4891 Unspecified atrial fibrillation: Secondary | ICD-10-CM

## 2012-09-08 DIAGNOSIS — Z7901 Long term (current) use of anticoagulants: Secondary | ICD-10-CM

## 2012-09-08 LAB — POCT INR: INR: 2.1

## 2012-09-11 LAB — PACEMAKER DEVICE OBSERVATION
BRDY-0002RV: 60 {beats}/min
BRDY-0004RV: 120 {beats}/min
RV LEAD AMPLITUDE: 9.3 mv
RV LEAD IMPEDENCE PM: 450 Ohm
RV LEAD THRESHOLD: 1 V

## 2012-09-26 ENCOUNTER — Ambulatory Visit (INDEPENDENT_AMBULATORY_CARE_PROVIDER_SITE_OTHER): Payer: Medicare Other | Admitting: *Deleted

## 2012-09-26 DIAGNOSIS — I4891 Unspecified atrial fibrillation: Secondary | ICD-10-CM

## 2012-09-26 DIAGNOSIS — Z7901 Long term (current) use of anticoagulants: Secondary | ICD-10-CM

## 2012-10-26 ENCOUNTER — Other Ambulatory Visit: Payer: Self-pay | Admitting: Internal Medicine

## 2012-10-31 ENCOUNTER — Ambulatory Visit (INDEPENDENT_AMBULATORY_CARE_PROVIDER_SITE_OTHER): Payer: Medicare Other | Admitting: *Deleted

## 2012-10-31 DIAGNOSIS — I4891 Unspecified atrial fibrillation: Secondary | ICD-10-CM

## 2012-10-31 DIAGNOSIS — Z7901 Long term (current) use of anticoagulants: Secondary | ICD-10-CM

## 2012-11-20 ENCOUNTER — Other Ambulatory Visit: Payer: Self-pay | Admitting: Internal Medicine

## 2012-11-30 ENCOUNTER — Other Ambulatory Visit: Payer: Self-pay | Admitting: Internal Medicine

## 2012-12-01 ENCOUNTER — Ambulatory Visit (INDEPENDENT_AMBULATORY_CARE_PROVIDER_SITE_OTHER): Payer: Medicare Other | Admitting: *Deleted

## 2012-12-01 DIAGNOSIS — Z7901 Long term (current) use of anticoagulants: Secondary | ICD-10-CM

## 2012-12-01 DIAGNOSIS — I4891 Unspecified atrial fibrillation: Secondary | ICD-10-CM

## 2012-12-18 ENCOUNTER — Other Ambulatory Visit: Payer: Self-pay | Admitting: Internal Medicine

## 2012-12-22 ENCOUNTER — Ambulatory Visit (INDEPENDENT_AMBULATORY_CARE_PROVIDER_SITE_OTHER): Payer: Medicare Other | Admitting: *Deleted

## 2012-12-22 DIAGNOSIS — Z7901 Long term (current) use of anticoagulants: Secondary | ICD-10-CM

## 2012-12-22 DIAGNOSIS — I495 Sick sinus syndrome: Secondary | ICD-10-CM

## 2012-12-22 DIAGNOSIS — I4891 Unspecified atrial fibrillation: Secondary | ICD-10-CM

## 2012-12-22 LAB — POCT INR: INR: 3

## 2012-12-22 LAB — PACEMAKER DEVICE OBSERVATION
BATTERY VOLTAGE: 2.9629 V
RV LEAD AMPLITUDE: 12 mv
RV LEAD IMPEDENCE PM: 462.5 Ohm
VENTRICULAR PACING PM: 20

## 2012-12-22 NOTE — Progress Notes (Signed)
Pt seen in device clinic for shivers. Pt wanted to rule out pacemaker problems. followup as directed.

## 2012-12-25 ENCOUNTER — Other Ambulatory Visit: Payer: Self-pay | Admitting: *Deleted

## 2012-12-25 MED ORDER — DILTIAZEM HCL ER COATED BEADS 120 MG PO CP24: 360.0000 mg | ORAL_CAPSULE | Freq: Every day | ORAL | Status: DC

## 2012-12-26 ENCOUNTER — Other Ambulatory Visit: Payer: Self-pay | Admitting: Cardiology

## 2012-12-26 MED ORDER — DILTIAZEM HCL ER COATED BEADS 120 MG PO CP24: 360.0000 mg | ORAL_CAPSULE | Freq: Every day | ORAL | Status: DC

## 2012-12-30 ENCOUNTER — Encounter: Payer: Self-pay | Admitting: Internal Medicine

## 2013-01-08 ENCOUNTER — Other Ambulatory Visit: Payer: Self-pay | Admitting: Internal Medicine

## 2013-01-09 ENCOUNTER — Ambulatory Visit (INDEPENDENT_AMBULATORY_CARE_PROVIDER_SITE_OTHER): Payer: Medicare Other | Admitting: *Deleted

## 2013-01-09 DIAGNOSIS — Z7901 Long term (current) use of anticoagulants: Secondary | ICD-10-CM

## 2013-01-09 DIAGNOSIS — I4891 Unspecified atrial fibrillation: Secondary | ICD-10-CM

## 2013-01-09 LAB — POCT INR: INR: 2.1

## 2013-01-18 ENCOUNTER — Other Ambulatory Visit: Payer: Self-pay | Admitting: Internal Medicine

## 2013-02-06 ENCOUNTER — Ambulatory Visit (INDEPENDENT_AMBULATORY_CARE_PROVIDER_SITE_OTHER): Payer: Medicare Other | Admitting: *Deleted

## 2013-02-06 DIAGNOSIS — Z7901 Long term (current) use of anticoagulants: Secondary | ICD-10-CM

## 2013-02-06 DIAGNOSIS — I4891 Unspecified atrial fibrillation: Secondary | ICD-10-CM

## 2013-02-06 LAB — POCT INR: INR: 2.5

## 2013-02-12 ENCOUNTER — Other Ambulatory Visit: Payer: Self-pay | Admitting: Internal Medicine

## 2013-02-13 ENCOUNTER — Ambulatory Visit (HOSPITAL_COMMUNITY)
Admission: RE | Admit: 2013-02-13 | Discharge: 2013-02-13 | Disposition: A | Discharge status: Home / Self Care | Payer: Medicare Other | Source: Ambulatory Visit | Attending: *Deleted | Admitting: *Deleted

## 2013-02-13 DIAGNOSIS — I4891 Unspecified atrial fibrillation: Secondary | ICD-10-CM | POA: Insufficient documentation

## 2013-02-13 DIAGNOSIS — E782 Mixed hyperlipidemia: Secondary | ICD-10-CM | POA: Insufficient documentation

## 2013-02-13 DIAGNOSIS — I890 Lymphedema, not elsewhere classified: Secondary | ICD-10-CM | POA: Insufficient documentation

## 2013-02-13 DIAGNOSIS — I10 Essential (primary) hypertension: Secondary | ICD-10-CM | POA: Insufficient documentation

## 2013-02-13 DIAGNOSIS — IMO0001 Reserved for inherently not codable concepts without codable children: Secondary | ICD-10-CM | POA: Insufficient documentation

## 2013-02-13 NOTE — Evaluation (Signed)
Physical Therapy Evaluation  Patient Details  Name: BRIGGETT TUCCILLO MRN: [id number] Date of Birth: November 17, 1945  Today's Date: 02/13/2013 Time: 4098-1191 PT Time Calculation (min): 45 min              Visit#: 1 of 18  Re-eval: 03/30/13    Authorization: medicare     Past Medical History:  Past Medical History  Diagnosis Date  . Hypertension   . Hyperlipidemia   . GERD (gastroesophageal reflux disease)   . Persistent atrial fibrillation      post-termination pauses with afib s/p PPM  . Degenerative joint disease   . Acute diastolic heart failure   . Sinoatrial node dysfunction     s/p PPM  . Congestive heart failure, unspecified   . Unspecified hypothyroidism   . Anxiety state, unspecified   . Unspecified venous (peripheral) insufficiency   . Obesity   . Sleep apnea     compliant with CPAP   Past Surgical History:  Past Surgical History  Procedure Laterality Date  . Pacemaker insertion      SJM by JA    Subjective Symptoms/Limitations Symptoms: Ms. Mini states that she noted two years ago that both of her legs began to swell up.  She states that her cardiologist told her to get support hose and when she did the wound care manager told her she would benefit from manual therapy.      Assessment  Pt volume of Rt LE  52,154.64 ending at 60cm;  Volume of Lt LE 55756.82 ending at 60 cm    Physical Therapy Assessment and Plan PT Assessment and Plan Clinical Impression Statement: Pt is a 67 yo female who states that ever since she had a pacemaker put in she has noted increased swelling in her B LE.  Pt has attempted juxtofit but states they were ill fitting and uncomfortable.  She went directily to compression garment prior to having any manual or bandaging to her LE.  She is now being referred to therapy for both manual and bandaging to decrease her volume prior to recieving garments to alllow a better fit and compliance with garments.  PT was educated on the need to  wear garments daily after therapy is completed. Pt will benefit from skilled therapeutic intervention in order to improve on the following deficits: Increased edema Rehab Potential: Good PT Frequency: Min 3X/week PT Duration: 6 weeks PT Plan: Pt to recieve both manual and bandaging for B LE.  Begin bandaging to knee only at first and progress to full leg if needed,.     Goals PT Short Term Goals Time to Complete Short Term Goals: 2 weeks PT Short Term Goal 1: Pt volume to be decreased by 30% PT Short Term Goal 2: Pt to be able to verbalize signs and sx of cellulitis PT Long Term Goals Time to Complete Long Term Goals: 4 weeks PT Long Term Goal 1: Pt to verbalize the need for wearing compression garment on a daily basis. PT Long Term Goal 2: Pt volumes to be reduced by 60%  Problem List Patient Active Problem List   Diagnosis Date Noted  . Lymphedema of left leg 02/13/2013  . Sleep apnea 08/18/2011  . Obesity 08/18/2011  . Encounter for long-term (current) use of anticoagulants 06/19/2010  . OTHER NONSPECIFIC FINDING EXAMINATION OF URINE 02/10/2009  . Tachycardia-bradycardia syndrome 11/18/2008  . PPM-St.Jude 11/18/2008  . HYPERLIPIDEMIA-MIXED 02/21/2008  . HYPERTENSION, BENIGN 02/21/2008  . ATRIAL FIBRILLATION 02/21/2008    PT  Plan of Care PT Home Exercise Plan: given tips for lymphedema as well as what short stretch bandages she will need to purchase.   GP Functional Assessment Tool Used: clinical judgement Functional Limitation: Other PT primary Other PT Primary Current Status (G9562): At least 60 percent but less than 80 percent impaired, limited or restricted Other PT Primary Goal Status (Z3086): At least 40 percent but less than 60 percent impaired, limited or restricted  Rykar Lebleu,CINDY 02/13/2013, 11:38 AM  Physician Documentation Your signature is required to indicate approval of the treatment plan as stated above.  Please sign and either send electronically or make  a copy of this report for your files and return this physician signed original.   Please mark one 1.__approve of plan  2. ___approve of plan with the following conditions.   ______________________________                                                          _____________________ Physician Signature                                                                                                             Date

## 2013-02-21 ENCOUNTER — Encounter: Payer: Self-pay | Admitting: Internal Medicine

## 2013-02-27 ENCOUNTER — Ambulatory Visit (HOSPITAL_COMMUNITY)
Admission: RE | Admit: 2013-02-27 | Discharge: 2013-02-27 | Disposition: A | Discharge status: Home / Self Care | Payer: Medicare Other | Source: Ambulatory Visit | Attending: *Deleted | Admitting: *Deleted

## 2013-02-27 DIAGNOSIS — E782 Mixed hyperlipidemia: Secondary | ICD-10-CM | POA: Insufficient documentation

## 2013-02-27 DIAGNOSIS — I890 Lymphedema, not elsewhere classified: Secondary | ICD-10-CM | POA: Insufficient documentation

## 2013-02-27 DIAGNOSIS — I4891 Unspecified atrial fibrillation: Secondary | ICD-10-CM | POA: Insufficient documentation

## 2013-02-27 DIAGNOSIS — I10 Essential (primary) hypertension: Secondary | ICD-10-CM | POA: Insufficient documentation

## 2013-02-27 DIAGNOSIS — IMO0001 Reserved for inherently not codable concepts without codable children: Secondary | ICD-10-CM | POA: Insufficient documentation

## 2013-02-27 NOTE — Progress Notes (Signed)
Physical Therapy Treatment Patient Details  Name: Megan [name] MRN: [id number] Date of Birth: 01/19/1946  Today's Date: 02/27/2013 Time: [phone] PT Time Calculation (min): 90 min Manual [phone]. Visit#: 2 of 18  Re-eval: 03/30/13   Authorization: medicare   Subjective: Symptoms/Limitations Symptoms: Pt states she is very weak and she has a significant back pain today Pain Assessment Currently in Pain?: Yes Pain Score: 7  Pain Location: Back    Exercise/Treatments Manual Therapy Manual Therapy: Other (comment) Other Manual Therapy: Pt recieved decongestive manual therapy including supraclavicular fossa; deep and superficial abdominal, and anterior inguinal-axillary anastomsis.  Foam cut for B LE. Pt recieved multilayer bandaging with short stretch bandages as well  as foam.   Physical Therapy Assessment and Plan PT Assessment and Plan Clinical Impression Statement: Pt LE with significant redness.  Pt states that legs are always that way.  Will follow closely for the possible need of antibiotics.  Pt instructed in diaphagmic breathing and the importance of this when one has lymphedema. Pt will benefit from skilled therapeutic intervention in order to improve on the following deficits: Increased edema Rehab Potential: Good PT Plan: begin both anterior and posterior inguinal-axillary anastomsis with manual unable to do posterior today secondary to cutting foam.    Goals    Problem List Patient Active Problem List   Diagnosis Date Noted  . Lymphedema of left leg 02/13/2013  . Sleep apnea 08/18/2011  . Obesity 08/18/2011  . Encounter for long-term (current) use of anticoagulants 06/19/2010  . OTHER NONSPECIFIC FINDING EXAMINATION OF URINE 02/10/2009  . Tachycardia-bradycardia syndrome 11/18/2008  . PPM-St.Jude 11/18/2008  . HYPERLIPIDEMIA-MIXED 02/21/2008  . HYPERTENSION, BENIGN 02/21/2008  . ATRIAL FIBRILLATION 02/21/2008      GP    RUSSELL,CINDY 02/27/2013,  4:21 PM

## 2013-03-01 ENCOUNTER — Ambulatory Visit (HOSPITAL_COMMUNITY)
Admission: RE | Admit: 2013-03-01 | Discharge: 2013-03-01 | Disposition: A | Discharge status: Home / Self Care | Payer: Medicare Other | Source: Ambulatory Visit | Attending: *Deleted | Admitting: *Deleted

## 2013-03-01 DIAGNOSIS — I890 Lymphedema, not elsewhere classified: Secondary | ICD-10-CM

## 2013-03-01 NOTE — Progress Notes (Signed)
Physical Therapy Treatment Patient Details  Name: Megan [name] MRN: [id number] Date of Birth: May 31, 1945  Today's Date: 03/01/2013 Time: [id number] PT Time Calculation (min): 95 min Manual [phone] Visit#: 3 of 18  Re-eval: 03/30/13    Authorization: medicare  Subjective: Symptoms/Limitations Symptoms: Pt states that she tried to put clips on her Lt leg prior to going to sleep last night to secure the short stretch bandages and by accident poked the clip into her leg.  When she woke up this morning the bed was wet with fluid.  Pt states that this did not concern her as her leg always does this when it gets a scratch on it.  Pain Assessment Currently in Pain?: No/denies (Pt states with wrapping her legs did not hurt.)  Precautions/Restrictions   infection  Exercise/Treatments  Manual Therapy Manual Therapy: Other (comment) Other Manual Therapy: Pt recieved decongestive manual therapy including suprclavicular fossa, deep and superficial abdominal, anterior and posterior inguingal -axillary anastomosis.  Rt LE was then wrapped using multilayer bandaging with foam. Lt bandages were wet and noted clear drainage coming from small cut in pt Rt lateral LE.  Pt had three sheets of calcium alginate placed over wound f/b 4x4, kerlix, coban and netting.   Physical Therapy Assessment and Plan PT Assessment and Plan Clinical Impression Statement: Pt had significant decreased volume from feet; min-mod decreased from LE; Reddness of LE has decreased s 50%.  Pt now has an open wound that is constantly draining serous fluid which we will need to keep an eye on.   Pt will benefit from skilled therapeutic intervention in order to improve on the following deficits: Increased edema Rehab Potential: Good PT Frequency: Min 3X/week PT Duration: 6 weeks PT Plan: Check Lt LE if drainage has stopped may proceed with multilayer bandaging with foam.          Problem List Patient Active Problem List   Diagnosis Date Noted  . Lymphedema of left leg 02/13/2013  . Sleep apnea 08/18/2011  . Obesity 08/18/2011  . Encounter for long-term (current) use of anticoagulants 06/19/2010  . OTHER NONSPECIFIC FINDING EXAMINATION OF URINE 02/10/2009  . Tachycardia-bradycardia syndrome 11/18/2008  . PPM-St.Jude 11/18/2008  . HYPERLIPIDEMIA-MIXED 02/21/2008  . HYPERTENSION, BENIGN 02/21/2008  . ATRIAL FIBRILLATION 02/21/2008       GP    Andreah Goheen,CINDY 03/01/2013, 12:21 PM

## 2013-03-02 ENCOUNTER — Ambulatory Visit (HOSPITAL_COMMUNITY)
Admission: RE | Admit: 2013-03-02 | Discharge: 2013-03-02 | Disposition: A | Discharge status: Home / Self Care | Payer: Medicare Other | Source: Ambulatory Visit | Attending: Physical Therapy | Admitting: Physical Therapy

## 2013-03-02 DIAGNOSIS — I890 Lymphedema, not elsewhere classified: Secondary | ICD-10-CM

## 2013-03-02 NOTE — Progress Notes (Signed)
Physical Therapy Treatment Patient Details  Name: Megan [name] MRN: [id number] Date of Birth: [birth year]  Today's Date: 03/02/2013 Time: 4098-1191 PT Time Calculation (min): 25 min Manual x 25 profore Visit#: 4 of 18  Re-eval: 03/30/13    Authorization: medicare  subjective: Symptoms/Limitations Symptoms: Pt states that the dressing on her Lt LE soaked through but she can tell that she is not draining as much as she was.  Pt had to take Rt bandages off secondary to being to tight.    Exercise/Treatments Manual Therapy Other Manual Therapy: Pt did not recieve decongestive manual techniques due to limited time.  Lt LE has increased reddness(back to pre lymphedema treatment state,), but drainage has decreased from copious to moderate.  Pt had profore bandaging placed on Lt LE with 3 calcium alginate layers to absorb drainage.  Rt. LE recieved compression bandaging using short stretch bandages and foam.    Physical Therapy Assessment and Plan PT Assessment and Plan Clinical Impression Statement: Per pt Lt LE was down significantly but has had increased volume since the wrapping has been taken off. Pt states comfortable wrap with current wrapping on Rt LE.  Continue with manual lymph drainage and compression dressings    Goals    Problem List Patient Active Problem List   Diagnosis Date Noted  . Lymphedema of left leg 02/13/2013  . Sleep apnea 08/18/2011  . Obesity 08/18/2011  . Encounter for long-term (current) use of anticoagulants 06/19/2010  . OTHER NONSPECIFIC FINDING EXAMINATION OF URINE 02/10/2009  . Tachycardia-bradycardia syndrome 11/18/2008  . PPM-St.Jude 11/18/2008  . HYPERLIPIDEMIA-MIXED 02/21/2008  . HYPERTENSION, BENIGN 02/21/2008  . ATRIAL FIBRILLATION 02/21/2008       GP    RUSSELL,CINDY 03/02/2013, 12:35 PM

## 2013-03-06 ENCOUNTER — Ambulatory Visit (HOSPITAL_COMMUNITY)
Admission: RE | Admit: 2013-03-06 | Discharge: 2013-03-06 | Disposition: A | Discharge status: Home / Self Care | Payer: Medicare Other | Source: Ambulatory Visit | Attending: *Deleted | Admitting: *Deleted

## 2013-03-06 ENCOUNTER — Ambulatory Visit (INDEPENDENT_AMBULATORY_CARE_PROVIDER_SITE_OTHER): Payer: Medicare Other | Admitting: *Deleted

## 2013-03-06 DIAGNOSIS — I890 Lymphedema, not elsewhere classified: Secondary | ICD-10-CM

## 2013-03-06 DIAGNOSIS — I4891 Unspecified atrial fibrillation: Secondary | ICD-10-CM

## 2013-03-06 DIAGNOSIS — Z7901 Long term (current) use of anticoagulants: Secondary | ICD-10-CM

## 2013-03-06 LAB — POCT INR: INR: 1.8

## 2013-03-06 NOTE — Progress Notes (Signed)
Physical Therapy Treatment Patient Details  Name: Megan [name] MRN: [id number] Date of Birth: 1946/01/11  Today's Date: 03/06/2013 Time: [phone] PT Time Calculation (min): 70 min Charge Manual [phone] Visit#: 5 of 18  Re-eval: 03/30/13    Authorization: medicare   Subjective: Symptoms/Limitations Symptoms: Pt states that she has had a little more energy.  She has noted that her swelling is down.  Lt LE is still weeping clear fluid; pt needed to take Lt dressing off the next day due to saturation. Pain Assessment Currently in Pain?: No/denies  Precautions/Restrictions     Exercise/Treatments     Manual Therapy Other Manual Therapy: Pt recieved manual decongestive therapy including supraclavicular, deep and superficial abdominal, anterior and posterior inguinal-axillary anastomosis to B LE.  Pt had multilayer short stretch bandaging with foam to Rt LE; Lt LE continues to drain therefore continued with calcium alginate and profore dressing to [employer].   B LE have decreased swelling and redness but pt does now have a yeast infection under her Lt breast.  Physical Therapy Assessment and Plan PT Assessment and Plan Clinical Impression Statement: Pt noticing it easier to ambulate now that the swelling is decreasing in her LE.  Pt encouraged to walk for ten minutes three times a day.   PT Plan: Check Lt LE if drainage has stopped ; if it has may proceed with multilayer bandaging with foam.      Goals  progressing  Problem List Patient Active Problem List   Diagnosis Date Noted  . Lymphedema of left leg 02/13/2013  . Sleep apnea 08/18/2011  . Obesity 08/18/2011  . Encounter for long-term (current) use of anticoagulants 06/19/2010  . OTHER NONSPECIFIC FINDING EXAMINATION OF URINE 02/10/2009  . Tachycardia-bradycardia syndrome 11/18/2008  . PPM-St.Jude 11/18/2008  . HYPERLIPIDEMIA-MIXED 02/21/2008  . HYPERTENSION, BENIGN 02/21/2008  . ATRIAL FIBRILLATION 02/21/2008        GP    RUSSELL,CINDY 03/06/2013, 10:47 AM

## 2013-03-08 ENCOUNTER — Ambulatory Visit (HOSPITAL_COMMUNITY)
Admission: RE | Admit: 2013-03-08 | Discharge: 2013-03-08 | Disposition: A | Discharge status: Home / Self Care | Payer: Medicare Other | Source: Ambulatory Visit | Attending: *Deleted | Admitting: *Deleted

## 2013-03-08 NOTE — Progress Notes (Signed)
Physical Therapy Treatment Patient Details  Name: Megan [name] MRN: [id number] Date of Birth: Jun 27, 1945  Today's Date: 03/08/2013 Time: 0930-1100 PT Time Calculation (min): 90 min  Visit#: 6 of 18  Re-eval: 03/13/13 Authorization: medicare  Authorization Visit#: 6 of 10  Charges:  Manual 75', self care 15'  Subjective: Symptoms/Limitations Symptoms: Pt states her Rt LE no longer hurts anymore and feels so much better.  States her Lt LE is draining less but remains swollen. Pain Assessment Currently in Pain?: No/denies   Objective: Manual Therapy Other Manual Therapy: Pt recieved manual decongestive therapy including supraclavicular, deep and superficial abdominal, inguinal-axillary anastomosis to B LE. Lotion applied and multilayer short stretch bandagiing with 1/2" foam to bilateral LE's.  Calcium alginate and ABD pads placed over pinpoint site of drainage.  Physical Therapy Assessment and Plan PT Assessment and Plan Clinical Impression Statement: Educated patient on various types of compression garments and bandaging with pressure gradient.  Lt LE continues to drain moderately, however very little redness.  Resumed bandaging on Lt LE today following MLD.  Pt will require re-eval next week to check decompression amount in bilateral LE's. PT Plan: Assess effectiveness of bandaging LT LE next visit; remeasure next week for progress/re-evaluate.     Problem List Patient Active Problem List   Diagnosis Date Noted  . Lymphedema of left leg 02/13/2013  . Sleep apnea 08/18/2011  . Obesity 08/18/2011  . Encounter for long-term (current) use of anticoagulants 06/19/2010  . OTHER NONSPECIFIC FINDING EXAMINATION OF URINE 02/10/2009  . Tachycardia-bradycardia syndrome 11/18/2008  . PPM-St.Jude 11/18/2008  . HYPERLIPIDEMIA-MIXED 02/21/2008  . HYPERTENSION, BENIGN 02/21/2008  . ATRIAL FIBRILLATION 02/21/2008       Lurena Nida, PTA/CLT 03/08/2013, 12:33 PM

## 2013-03-13 ENCOUNTER — Ambulatory Visit (HOSPITAL_COMMUNITY)
Admission: RE | Admit: 2013-03-13 | Discharge: 2013-03-13 | Disposition: A | Discharge status: Home / Self Care | Payer: Medicare Other | Source: Ambulatory Visit | Attending: *Deleted | Admitting: *Deleted

## 2013-03-13 NOTE — Progress Notes (Signed)
Physical Therapy Lymphedema Re-evaluation Patient Details  Name: Megan [name] MRN: [id number] Date of Birth: Apr 28, 1945  Today's Date: 03/13/2013 Time: 0934-1100 PT Time Calculation (min): 86 min  Visit#: 7 of 18  Re-eval: 04/10/13 Authorization: medicare  Authorization Visit#: 7 of 17  Charges:  Manual 75'  Subjective: Symptoms/Limitations Symptoms: Pt states her Lt LE quit draining and she was able to keep the bandages on Bilateral LE's.  Pt states her LE's itch. Pain Assessment Currently in Pain?: No/denies  Objective: Manual Therapy Other Manual Therapy: Pt received manual decongestive therapy including supraclavicular, deep and superficial abdominal, inguinal-axillary anastomosis to B LE. B LE's cleansed and Lotion applied and multilayer short stretch bandagiing with 1/2" foam to bilateral LE's.   Bilateral LE circumferential measurements in cm: Date 02/13/2013  03/13/2013    Right Left Right Left  MCP 22.60 23.80 22.5 23  ankle 32.7 34.7 25.50 30.20  4cm 39.30 43.20 29.50 35.50  8cm 43.80 47.00 33.50 39.00  12 cm 46.70 51.10 38.40 43.50  16cm 51.60 55.00 42.80 49.50  20cm 53.50 58.00 46.00 52.00  24cm 55.00 59.70 50.70 54.80  28cm 54.30 58.00 50.50 51.00  32cm 51.00 57.70 48.20 54.00  36cm 57.50 63.00 56.50 63.50  40cm 66.50 66.20 62.00 65.00  44cm 72.50 70.30 65.00 69.00  48cm 73.20 73.80 68.50 74.00  52cm 77.20 75.00 70.00 75.00  56cm 80.80 79.30 74.00 76.00        Sum of squares 52154.64 55756.82 42344.88 49684.08  Total Volume 78295.621 17747.95 13478.799 30865.78    Physical Therapy Assessment and Plan PT Assessment and Plan Clinical Impression Statement: Pt has completed 7 visits X 4 weeks for decongestive therapy on bilateral LE's.  LE's remeasured today circumferentially with overall reduction of 3,122.54 cc on Rt LE and 1,933.01 cc on Lt LE.  Pt with overal improved skin integrity bilaterally and no longer with draining wound on Lt LE.  Pt is  progressing well toward goals and wound benefit from continuing therapy for further decompression.  Weekly measurements will be taken to assess progress. PT Plan: Continue current POC X 4 more weeks.  Measure weekly for progress.    Goals PT Short Term Goals Time to Complete Short Term Goals: 2 weeks PT Short Term Goal 1: Pt volume to be decreased by 30%- Progress: Met PT Short Term Goal 2: Pt to be able to verbalize signs and sx of cellulitis - Progress: Met  PT Long Term Goals Time to Complete Long Term Goals: 4 weeks PT Long Term Goal 1: Pt to verbalize the need for wearing compression garment on a daily basis.- Progress: Progressing toward goal PT Long Term Goal 2: Pt volumes to be reduced by 60%- Progress: Progressing toward goal   Problem List Patient Active Problem List   Diagnosis Date Noted  . Lymphedema of left leg 02/13/2013  . Sleep apnea 08/18/2011  . Obesity 08/18/2011  . Encounter for long-term (current) use of anticoagulants 06/19/2010  . OTHER NONSPECIFIC FINDING EXAMINATION OF URINE 02/10/2009  . Tachycardia-bradycardia syndrome 11/18/2008  . PPM-St.Jude 11/18/2008  . HYPERLIPIDEMIA-MIXED 02/21/2008  . HYPERTENSION, BENIGN 02/21/2008  . ATRIAL FIBRILLATION 02/21/2008    PT - End of Session Activity Tolerance: Patient tolerated treatment well General Behavior During Therapy: [facility] for tasks assessed/performed  GP Functional Assessment Tool Used: clinical judgement Functional Limitation: Other PT primary Other PT Primary Current Status (I6962): At least 60 percent but less than 80 percent impaired, limited or restricted Other PT Primary Goal Status (X5284): At least  40 percent but less than 60 percent impaired, limited or restricted  Lurena Nida, PTA/CLT 03/13/2013, 11:56 AM

## 2013-03-15 ENCOUNTER — Ambulatory Visit (HOSPITAL_COMMUNITY)
Admission: RE | Admit: 2013-03-15 | Discharge: 2013-03-15 | Disposition: A | Discharge status: Home / Self Care | Payer: Medicare Other | Source: Ambulatory Visit | Attending: *Deleted | Admitting: *Deleted

## 2013-03-15 NOTE — Progress Notes (Signed)
Physical Therapy Treatment Patient Details  Name: Megan [name] MRN: [id number] Date of Birth: 07/21/1945  Today's Date: 03/15/2013 Time: 0940-1055 PT Time Calculation (min): 75 min  Visit#: 8 of 18  Re-eval: 04/10/13 Authorization: medicare  Authorization Visit#: 8 of 17  Charges:  Manual 70'  Subjective: Symptoms/Limitations Symptoms: Pt states she noticed a lump of swelling at top of lower Lt LE where it was draining before.  States her LE's hurt most at night.  Reports no itching after last session. Pain Assessment Currently in Pain?: No/denies   Objective: Manual Therapy Other Manual Therapy: Pt received manual decongestive therapy including supraclavicular, deep and superficial abdominal, inguinal-axillary anastomosis to B LE. B LE's cleansed and Lotion applied and multilayer short stretch bandagiing with 1/2" foam to bilateral LE's  Physical Therapy Assessment and Plan PT Assessment and Plan Clinical Impression Statement: Discussed moving forward with compression garments.  Paperwork completed and faxed to [facility] for fitting.  Order for garments sent to MD.  Bilateral LE's have much improved with good response to decompression. PT Plan: Measure next visit for progress; await order for compression garments/reidsleeve.     Problem List Patient Active Problem List   Diagnosis Date Noted  . Lymphedema of left leg 02/13/2013  . Sleep apnea 08/18/2011  . Obesity 08/18/2011  . Encounter for long-term (current) use of anticoagulants 06/19/2010  . OTHER NONSPECIFIC FINDING EXAMINATION OF URINE 02/10/2009  . Tachycardia-bradycardia syndrome 11/18/2008  . PPM-St.Jude 11/18/2008  . HYPERLIPIDEMIA-MIXED 02/21/2008  . HYPERTENSION, BENIGN 02/21/2008  . ATRIAL FIBRILLATION 02/21/2008    PT - End of Session Activity Tolerance: Patient tolerated treatment well General Behavior During Therapy: WFL for tasks assessed/performed   Lurena Nida, PTA/CLT 03/15/2013, 1:13  PM

## 2013-03-20 ENCOUNTER — Ambulatory Visit (HOSPITAL_COMMUNITY)
Admission: RE | Admit: 2013-03-20 | Discharge: 2013-03-20 | Disposition: A | Discharge status: Home / Self Care | Payer: Medicare Other | Source: Ambulatory Visit | Attending: *Deleted | Admitting: *Deleted

## 2013-03-20 NOTE — Progress Notes (Signed)
Physical Therapy Treatment Patient Details  Name: Megan [name] MRN: [id number] Date of Birth: Jan 01, 1946  Today's Date: 03/20/2013 Time: 0845-1000 PT Time Calculation (min): 75 min  Visit#: 9 of 18  Re-eval: 04/10/13  Authorization: medicare  Authorization Visit#: 9 of 17  Charges:  Manual 70'  Subjective: Symptoms/Limitations Symptoms: Pt states her bandages stayed in place, only her toe wraps on her Rt foot came off. Pain Assessment Currently in Pain?: No/denies   Objective: Manual Therapy Other Manual Therapy: Pt received manual decongestive therapy including supraclavicular, deep and superficial abdominal, inguinal-axillary anastomosis to B LE. B LE's cleansed and Lotion applied and multilayer short stretch bandagiing with 1/2" foam to bilateral LE's  Physical Therapy Assessment and Plan PT Assessment and Plan Clinical Impression Statement: Paperwork sent to [facility]; to contact therapist when able to come for measurements.  Continued response to CLT.  Would benefit from full Lt LE wrap due to swelling in thigh and knee. PT Plan: Measure next visit for progress; await order for compression garments/reidsleeve.     Problem List Patient Active Problem List   Diagnosis Date Noted  . Lymphedema of left leg 02/13/2013  . Sleep apnea 08/18/2011  . Obesity 08/18/2011  . Encounter for long-term (current) use of anticoagulants 06/19/2010  . OTHER NONSPECIFIC FINDING EXAMINATION OF URINE 02/10/2009  . Tachycardia-bradycardia syndrome 11/18/2008  . PPM-St.Jude 11/18/2008  . HYPERLIPIDEMIA-MIXED 02/21/2008  . HYPERTENSION, BENIGN 02/21/2008  . ATRIAL FIBRILLATION 02/21/2008    PT - End of Session Activity Tolerance: Patient tolerated treatment well General Behavior During Therapy: WFL for tasks assessed/performed   Lurena Nida, PTA/CLT 03/20/2013, 10:37 AM

## 2013-03-23 ENCOUNTER — Ambulatory Visit (HOSPITAL_COMMUNITY)
Admission: RE | Admit: 2013-03-23 | Discharge: 2013-03-23 | Disposition: A | Discharge status: Home / Self Care | Payer: Medicare Other | Source: Ambulatory Visit | Attending: *Deleted | Admitting: *Deleted

## 2013-03-23 DIAGNOSIS — I890 Lymphedema, not elsewhere classified: Secondary | ICD-10-CM

## 2013-03-23 NOTE — Evaluation (Addendum)
Physical Therapy Evaluation  Patient Details  Name: Megan [name] MRN: [id number] Date of Birth: 1945-08-30  Today's Date: 03/23/2013 Time: 7829-5621 PT Time Calculation (min): 105 min Charge:  Self care 1430-1450; manual 3086-5784             Visit#: 10 of 18  Re-eval: 04/10/13    Authorization: medicare    Authorization Visit#: 10 of 17   Past Medical History:  Past Medical History  Diagnosis Date  . Hypertension   . Hyperlipidemia   . GERD (gastroesophageal reflux disease)   . Persistent atrial fibrillation      post-termination pauses with afib s/p PPM  . Degenerative joint disease   . Acute diastolic heart failure   . Sinoatrial node dysfunction     s/p PPM  . Congestive heart failure, unspecified   . Unspecified hypothyroidism   . Anxiety state, unspecified   . Unspecified venous (peripheral) insufficiency   . Obesity   . Sleep apnea     compliant with CPAP   Past Surgical History:  Past Surgical History  Procedure Laterality Date  . Pacemaker insertion      SJM by JA    Subjective Symptoms/Limitations Symptoms: Pt states that her legs were killing her especially the toes.  Pt states she did not take bandages off due to not knowing if she could.    Manual Therapy Manual Therapy: Other (comment) Other Manual Therapy: Pt measured for volume change  2,505  cc on the right LE and 2,384 cc in the Lt LE.  Pt does have increase redness along the Lt ankle and along the medial aspect of the MCP jt; Rt LE has two redden areas that are longitudinal alond the medial aspect as wsell as her forefoot  and medial aspcet of 1sth MCP.  Therapist spoke to pt at length about taking bandages off if she begins to have increased pain.  Pt received manual decongestive techniques including suprclavicular, deep and superficial abdominal and anterior inguinal axillary anastomosis .  Posterior not done due to time constraints secondary to measuring LE .  Pt had B LE wrapped with  short-stretch bandages and foam making sure therapist used extra cotton for ankle and forefoot protection.  Pt toes were not wrapped as they were still hurting from previious wrap.    Physical Therapy Assessment and Plan PT Assessment and Plan Clinical Impression Statement: Awaiting measurment for compression garment.  Begin wrapping full LE .  Remeasure pt on 03/30/2013 to see if pt is still decongesting; if not may D/C after pt recieves compression is so continue to see until maximum volume lost has been obtained. Rehab Potential: Good PT Duration:  (Pt to be seen for three more weeks ) 3x week x 3 weeks. PT Plan: await order for compression garments/reidsleeve.    Goals PT Short Term Goals Time to Complete Short Term Goals: 2 weeks PT Short Term Goal 1: Pt volume to be decreased by 30% Rt is decreased by 155 Lt decrased by 14.5% PT Short Term Goal 1 - Progress: Progressing toward goal PT Short Term Goal 2: Pt to be able to verbalize signs and sx of cellulitis PT Short Term Goal 2 - Progress: Met PT Long Term Goals PT Long Term Goal 1: Pt to verbalize the need for wearing compression garment on a daily basis. PT Long Term Goal 1 - Progress: Met PT Long Term Goal 2: Pt volumes to be reduced by 60% PT Long Term Goal 2 - Progress:  Not met  Problem List Patient Active Problem List   Diagnosis Date Noted  . Lymphedema of left leg 02/13/2013  . Sleep apnea 08/18/2011  . Obesity 08/18/2011  . Encounter for long-term (current) use of anticoagulants 06/19/2010  . OTHER NONSPECIFIC FINDING EXAMINATION OF URINE 02/10/2009  . Tachycardia-bradycardia syndrome 11/18/2008  . PPM-St.Jude 11/18/2008  . HYPERLIPIDEMIA-MIXED 02/21/2008  . HYPERTENSION, BENIGN 02/21/2008  . ATRIAL FIBRILLATION 02/21/2008    PT - End of Session Activity Tolerance: Patient tolerated treatment well General Behavior During Therapy: [facility] for tasks assessed/performed  GP Functional Assessment Tool Used: clinical  judgement Other PT Primary Current Status (Z6109): At least 40 percent but less than 60 percent impaired, limited or restricted Other PT Primary Goal Status (U0454): At least 40 percent but less than 60 percent impaired, limited or restricted  Crystin Lechtenberg,CINDY 03/23/2013, 4:52 PM  Physician Documentation Your signature is required to indicate approval of the treatment plan as stated above.  Please sign and either send electronically or make a copy of this report for your files and return this physician signed original.   Please mark one 1.__approve of plan  2. ___approve of plan with the following conditions.   ______________________________                                                          _____________________ Physician Signature                                                                                                             Date

## 2013-03-26 ENCOUNTER — Ambulatory Visit (HOSPITAL_COMMUNITY)
Admission: RE | Admit: 2013-03-26 | Discharge: 2013-03-26 | Disposition: A | Discharge status: Home / Self Care | Payer: Medicare Other | Source: Ambulatory Visit | Attending: *Deleted | Admitting: *Deleted

## 2013-03-26 DIAGNOSIS — I890 Lymphedema, not elsewhere classified: Secondary | ICD-10-CM

## 2013-03-26 NOTE — Progress Notes (Signed)
Physical Therapy Treatment Patient Details  Name: Megan [name] MRN: [id number] Date of Birth: 14-Apr-1945  Today's Date: 03/26/2013 Time: 1025-1155 PT Time Calculation (min): 90 min  Visit#: 11 of 18  Re-eval: 04/10/13    Authorization: medicare  Authorization Time Period:    Authorization Visit#: 11 of 18   Subjective: Symptoms/Limitations Symptoms: Pt states that the bandages were uncomfortable but not painful like the day before. Pain Assessment Currently in Pain?: No/denies     Manual Therapy Other Manual Therapy: Redness along Lt ankle and medial aspect of MCP jt has decreased; Rt redden areas on LE and foot have also decreased.  Pt had decongestive manual techniques including supraclavicular fossa; deep and superfical abdominal and anterior and posterior inguinal axiillary anastomisis B.  Therapist cut foam for Lt thigh.  Pt then had multiple layer compression wrap with foam for entire Lt LE as well as knee to foot on Rt LE.    Physical Therapy Assessment and Plan PT Assessment and Plan Clinical Impression Statement: Pt would benefit from comprssion garment measurements, (call is already in place).  Remeasure pt on 03/30/2013 to see if pt is continuing to decongest. PT Plan: await order for compression garments/reidsleeve.    Goals    Problem List Patient Active Problem List   Diagnosis Date Noted  . Lymphedema of left leg 02/13/2013  . Sleep apnea 08/18/2011  . Obesity 08/18/2011  . Encounter for long-term (current) use of anticoagulants 06/19/2010  . OTHER NONSPECIFIC FINDING EXAMINATION OF URINE 02/10/2009  . Tachycardia-bradycardia syndrome 11/18/2008  . PPM-St.Jude 11/18/2008  . HYPERLIPIDEMIA-MIXED 02/21/2008  . HYPERTENSION, BENIGN 02/21/2008  . ATRIAL FIBRILLATION 02/21/2008    PT - End of Session Activity Tolerance: Patient tolerated treatment well General Behavior During Therapy: WFL for tasks assessed/performed  GP     RUSSELL,CINDY 03/26/2013, 4:18 PM

## 2013-03-27 ENCOUNTER — Ambulatory Visit (INDEPENDENT_AMBULATORY_CARE_PROVIDER_SITE_OTHER): Payer: Medicare Other | Admitting: *Deleted

## 2013-03-27 ENCOUNTER — Encounter (INDEPENDENT_AMBULATORY_CARE_PROVIDER_SITE_OTHER): Payer: Self-pay

## 2013-03-27 DIAGNOSIS — I4891 Unspecified atrial fibrillation: Secondary | ICD-10-CM

## 2013-03-27 DIAGNOSIS — Z7901 Long term (current) use of anticoagulants: Secondary | ICD-10-CM

## 2013-03-27 LAB — POCT INR: INR: 2

## 2013-03-28 ENCOUNTER — Ambulatory Visit (HOSPITAL_COMMUNITY)
Admission: RE | Admit: 2013-03-28 | Discharge: 2013-03-28 | Disposition: A | Discharge status: Home / Self Care | Payer: Medicare Other | Source: Ambulatory Visit | Attending: *Deleted | Admitting: *Deleted

## 2013-03-28 DIAGNOSIS — I890 Lymphedema, not elsewhere classified: Secondary | ICD-10-CM

## 2013-03-28 NOTE — Progress Notes (Signed)
Physical Therapy Treatment Patient Details  Name: Megan [name] MRN: [id number] Date of Birth: [birth year]  Today's Date: 03/28/2013 Time: 1015-1145 PT Time Calculation (min): 90 min Charge:  Manual 1020-1145 Visit#: 12 of 18  Re-eval: 04/10/13   Authorization Visit#: 12 of 18   Subjective: Symptoms/Limitations Symptoms: Pt states that her bandages on her thigh came off.   Exercise/Treatments Manual Therapy Other Manual Therapy: Redness is now completely gone.  Pt states that swelling located on the lateral aspect of Lt knee has been reduced with thigh compression wrap.  Pt recieved manual techniques including supraclavicular fossa; deep and supreficial abdominal as well as anterior and posterior inguinal-axillary anastomissi to B LE.   Pt wrapped with multiple layers of short-stretch banadages and  foam from foot to knee on Rt LE and from foot to thigh  on Lt LE.    Physical Therapy Assessment and Plan PT Assessment and Plan Clinical Impression Statement: Pt to be remeasured next treatment. Rehab Potential: Good PT Plan: await order for compression garments/reidsleeve.    Goals  progresing  Problem List Patient Active Problem List   Diagnosis Date Noted  . Lymphedema of left leg 02/13/2013  . Sleep apnea 08/18/2011  . Obesity 08/18/2011  . Encounter for long-term (current) use of anticoagulants 06/19/2010  . OTHER NONSPECIFIC FINDING EXAMINATION OF URINE 02/10/2009  . Tachycardia-bradycardia syndrome 11/18/2008  . PPM-St.Jude 11/18/2008  . HYPERLIPIDEMIA-MIXED 02/21/2008  . HYPERTENSION, BENIGN 02/21/2008  . ATRIAL FIBRILLATION 02/21/2008    PT - End of Session Activity Tolerance: Patient tolerated treatment well General Behavior During Therapy: WFL for tasks assessed/performed  GP    Alexey Rhoads,CINDY 03/28/2013, 2:35 PM

## 2013-03-30 ENCOUNTER — Ambulatory Visit (HOSPITAL_COMMUNITY)
Admission: RE | Admit: 2013-03-30 | Discharge: 2013-03-30 | Disposition: A | Discharge status: Home / Self Care | Payer: Medicare Other | Source: Ambulatory Visit | Attending: *Deleted | Admitting: *Deleted

## 2013-03-30 DIAGNOSIS — IMO0001 Reserved for inherently not codable concepts without codable children: Secondary | ICD-10-CM | POA: Insufficient documentation

## 2013-03-30 DIAGNOSIS — I4891 Unspecified atrial fibrillation: Secondary | ICD-10-CM | POA: Insufficient documentation

## 2013-03-30 DIAGNOSIS — I10 Essential (primary) hypertension: Secondary | ICD-10-CM | POA: Insufficient documentation

## 2013-03-30 DIAGNOSIS — I890 Lymphedema, not elsewhere classified: Secondary | ICD-10-CM | POA: Insufficient documentation

## 2013-03-30 DIAGNOSIS — E782 Mixed hyperlipidemia: Secondary | ICD-10-CM | POA: Insufficient documentation

## 2013-03-30 NOTE — Progress Notes (Signed)
Physical Therapy Treatment Patient Details  Name: Megan [name] MRN: [id number] Date of Birth: July 11, 1945  Today's Date: 03/30/2013 Time: 1015-1145 PT Time Calculation (min): 90 min Charge:  Manual 90 Visit#: 13 of 18  Re-eval: 04/10/13    Authorization: medicare  Authorization Time Period:    Authorization Visit#: 12 of 18   Subjective: Symptoms/Limitations Symptoms: Pt states she took the bandages off this morning in order to shower. Pain Assessment Currently in Pain?: No/denies      Manual Therapy Other Manual Therapy: Pt recieved manual decongestive therapy to include supraclavicular fossa, deep and superficial abdominal and anterior and posterior inguinal-axillary anasomosis.  Pt then recieved multilayer short-stretch dressing with foam for entire Lt LE and Rt LE foot to knee.   Physical Therapy Assessment and Plan PT Assessment and Plan Clinical Impression Statement: Pt was remeasured ; Pt had gained 289 cc in Rt and 14 cc in LT LE;  anticipate that this was due to pt unwrapping her bandages and showering this morning therefore she has been hours without her bandages.  Pt is close to maximum reduction and is ready to be fitted for compression wrap.  Recommend Wallie Char if a custom garment will not be possible PT Plan: Contact Hoyle Sauer RE custom fit garment.    Goals    Problem List Patient Active Problem List   Diagnosis Date Noted  . Lymphedema of left leg 02/13/2013  . Sleep apnea 08/18/2011  . Obesity 08/18/2011  . Encounter for long-term (current) use of anticoagulants 06/19/2010  . OTHER NONSPECIFIC FINDING EXAMINATION OF URINE 02/10/2009  . Tachycardia-bradycardia syndrome 11/18/2008  . PPM-St.Jude 11/18/2008  . HYPERLIPIDEMIA-MIXED 02/21/2008  . HYPERTENSION, BENIGN 02/21/2008  . ATRIAL FIBRILLATION 02/21/2008       GP    RUSSELL,CINDY 03/30/2013, 4:55 PM

## 2013-04-03 ENCOUNTER — Ambulatory Visit (HOSPITAL_COMMUNITY)
Admission: RE | Admit: 2013-04-03 | Discharge: 2013-04-03 | Disposition: A | Discharge status: Home / Self Care | Payer: Medicare Other | Source: Ambulatory Visit | Attending: *Deleted | Admitting: *Deleted

## 2013-04-03 NOTE — Progress Notes (Addendum)
Physical Therapy Treatment Patient Details  Name: Megan [name] MRN: [id number] Date of Birth: 14-Oct-1945  Today's Date: 04/03/2013 Time: 1950-9326 PT Time Calculation (min): 87 min  Visit#: 14 of 18  Re-eval: 04/10/13 Authorization: medicare  Authorization Visit#: 14 of 18  Charges:  Manual 80'  Subjective: Symptoms/Limitations Symptoms: Pt had removed bandages from her Lt thigh.  All other bandages remained intact. Pain Assessment Currently in Pain?: No/denies   Objective: Manual Therapy Other Manual Therapy: Pt recieved manual decongestive therapy to include supraclavicular fossa, deep and superficial abdominal and anterior and posterior inguinal-axillary anasomosis. Pt then recieved multilayer short-stretch dressing with foam for lower B LE's foot to knee.  Physical Therapy Assessment and Plan PT Assessment and Plan Clinical Impression Statement: Discussed garment options vs continuing with bandaging thigh.  Pt reports she would not be compliant with wearing garments to thigh level, thus wishes at this time to make appointment with fitter for juxtafit garments to knee high level.  Made appt with Edd Arbour at [facility] for fitting on Friday morning.  Will measure next visit. PT Plan: Remeasure next visit; Anticipate discharge this week.     Problem List Patient Active Problem List   Diagnosis Date Noted  . Lymphedema of left leg 02/13/2013  . Sleep apnea 08/18/2011  . Obesity 08/18/2011  . Encounter for long-term (current) use of anticoagulants 06/19/2010  . OTHER NONSPECIFIC FINDING EXAMINATION OF URINE 02/10/2009  . Tachycardia-bradycardia syndrome 11/18/2008  . PPM-St.Jude 11/18/2008  . HYPERLIPIDEMIA-MIXED 02/21/2008  . HYPERTENSION, BENIGN 02/21/2008  . ATRIAL FIBRILLATION 02/21/2008      Teena Irani, PTA/CLT 04/03/2013, 12:18 PM

## 2013-04-05 ENCOUNTER — Ambulatory Visit (HOSPITAL_COMMUNITY)
Admission: RE | Admit: 2013-04-05 | Discharge: 2013-04-05 | Disposition: A | Discharge status: Home / Self Care | Payer: Medicare Other | Source: Ambulatory Visit | Attending: *Deleted | Admitting: *Deleted

## 2013-04-05 DIAGNOSIS — I890 Lymphedema, not elsewhere classified: Secondary | ICD-10-CM

## 2013-04-05 NOTE — Progress Notes (Signed)
Physical Therapy Treatment Patient Details  Name: Megan [name] MRN: [id number] Date of Birth: Aug 24, 1945  Today's Date: 04/05/2013 Time: 0940-1110 PT Time Calculation (min): 90 min  Visit#: 15 of 18  Re-eval: 04/10/13    Authorization: medicare  Authorization Time Period:    Authorization Visit#: 15 of 18   Subjective: Symptoms/Limitations Symptoms: Pt states that she had to take her dressing off her LT LE the same day it was put  on due to irritation at her bunion.     Exercise/Treatments     Manual Therapy Other Manual Therapy: Pt recieved mjanual decongestive therapy to include supraclavicular foassa, deep and superficial abdominal anterior and posterior inguinal-axillary anastomosis.  Pt then recieved multilayer short stretch compression wrapping with foam for entire leg of Lt LE and Rt foot to knee Increased cotton used at bunion area to avoid discomfort. Physical Therapy Assessment and Plan PT Assessment and Plan Clinical Impression Statement: Pt to be measured Friday AM for juxtafit garments.  Pt was not wrapped to thigh last treatment and noted increased swelling in thigh area today.    Goals  progressing  Problem List Patient Active Problem List   Diagnosis Date Noted  . Lymphedema of left leg 02/13/2013  . Sleep apnea 08/18/2011  . Obesity 08/18/2011  . Encounter for long-term (current) use of anticoagulants 06/19/2010  . OTHER NONSPECIFIC FINDING EXAMINATION OF URINE 02/10/2009  . Tachycardia-bradycardia syndrome 11/18/2008  . PPM-St.Jude 11/18/2008  . HYPERLIPIDEMIA-MIXED 02/21/2008  . HYPERTENSION, BENIGN 02/21/2008  . ATRIAL FIBRILLATION 02/21/2008    PT Plan of Care PT Home Exercise Plan: Pt to be instructed in paint roller technique for abdominal area next treatment.  GP    Atziry Baranski,CINDY 04/05/2013, 11:22 AM

## 2013-04-06 ENCOUNTER — Ambulatory Visit (HOSPITAL_COMMUNITY)
Admission: RE | Admit: 2013-04-06 | Discharge: 2013-04-06 | Disposition: A | Discharge status: Home / Self Care | Payer: Medicare Other | Source: Ambulatory Visit | Attending: *Deleted | Admitting: *Deleted

## 2013-04-06 DIAGNOSIS — I890 Lymphedema, not elsewhere classified: Secondary | ICD-10-CM

## 2013-04-06 NOTE — Progress Notes (Signed)
Physical Therapy Treatment Patient Details  Name: Megan [name] MRN: [id number] Date of Birth: 1945-11-17  Today's Date: 04/06/2013 Time:1310  - 58  charge:  Self care 36  Visit#: 16 of 16   Authorization: medicare  Authorization Time Period:    Authorization Visit#: 16 of 16   Subjective: Symptoms/Limitations Symptoms: Imogene measured pt for [employer].  Pt is in a short and the short comes several inches away from her knee therapist explained that she feels the pt needs the compression to the knee area.  Requested when pt goes back to [facility] to request a regular not short.     Manual Therapy Other Manual Therapy: Pt instructed in manual decongestive techniques.  Therapist verbally and gave pt written technique for pt to complete decongestive manual techniques including  supraclavicular fossa; deep and superficial abdominal then using a paint roller to complete inguinal-axillary anastomosis.  Pt needed to go over this multiple times.    Physical Therapy Assessment and Plan PT Assessment and Plan Clinical Impression Statement: Pt recieved Juxtafit garments.  Therapist vocalized concerns that juxtafit were to short for patient.  Pt states she was to return to [facility] where she was fitted on Monday and would vocalized this.   PT educated in self massaging techniques.  Pt remeasured.  Pt has increased fluid in Rt LE since last remeasured but less in LT  ( + 256 CC vs LT decreasing by 1647 cc.  Therapist feels Lt decrease is due to thigh compression which was discussed with pt, however, pt does not feel she is able to put thigh compression on.  Pt overall reductioan on Rt is 6,961.0 cc and Lt 7,534.87CC .  PT Plan: discharge to home self massage and juxtafit garments.     Goals PT Short Term Goals PT Short Term Goal 1: Volume decreased by 305 PT Short Term Goal 1 - Progress: Met PT Short Term Goal 2: Pt to be able to verbalize signs and sx of cellulitis PT Short Term Goal 2 -  Progress: Met PT Long Term Goals PT Long Term Goal 1: Pt to verbalize the need for wearing compression garment on a daily basis. PT Long Term Goal 1 - Progress: Met PT Long Term Goal 2: Pt volumes to be reduced by 60% PT Long Term Goal 2 - Progress: Not met  Problem List Patient Active Problem List   Diagnosis Date Noted  . Lymphedema of left leg 02/13/2013  . Sleep apnea 08/18/2011  . Obesity 08/18/2011  . Encounter for long-term (current) use of anticoagulants 06/19/2010  . OTHER NONSPECIFIC FINDING EXAMINATION OF URINE 02/10/2009  . Tachycardia-bradycardia syndrome 11/18/2008  . PPM-St.Jude 11/18/2008  . HYPERLIPIDEMIA-MIXED 02/21/2008  . HYPERTENSION, BENIGN 02/21/2008  . ATRIAL FIBRILLATION 02/21/2008       GP Functional Assessment Tool Used: clinical judgement Functional Limitation: Other PT primary Other PT Primary Goal Status (G9201): At least 40 percent but less than 60 percent impaired, limited or restricted Other PT Primary Discharge Status [phone]): At least 60 percent but less than 80 percent impaired, limited or restricted  RUSSELL,CINDY 04/06/2013, 3:27 PM Your signature is required to indicate approval of the treatment plan as stated above.  Please sign and return making a copy for your files.  You may hard copy or send electronically.  Please check one: ___1.  Approve of this plan  ___2.  Approve of this plan with the following changes.   ____________________________  _____________ Physician                                                                      Date   

## 2013-04-09 ENCOUNTER — Ambulatory Visit (HOSPITAL_COMMUNITY): Payer: Medicare Other | Admitting: Physical Therapy

## 2013-04-24 ENCOUNTER — Ambulatory Visit (INDEPENDENT_AMBULATORY_CARE_PROVIDER_SITE_OTHER): Payer: Medicare Other | Admitting: *Deleted

## 2013-04-24 DIAGNOSIS — I4891 Unspecified atrial fibrillation: Secondary | ICD-10-CM

## 2013-04-24 DIAGNOSIS — Z7901 Long term (current) use of anticoagulants: Secondary | ICD-10-CM

## 2013-04-24 DIAGNOSIS — Z5181 Encounter for therapeutic drug level monitoring: Secondary | ICD-10-CM | POA: Insufficient documentation

## 2013-04-24 LAB — POCT INR: INR: 3.8

## 2013-05-18 ENCOUNTER — Ambulatory Visit (INDEPENDENT_AMBULATORY_CARE_PROVIDER_SITE_OTHER): Payer: Medicare Other | Admitting: *Deleted

## 2013-05-18 DIAGNOSIS — Z7901 Long term (current) use of anticoagulants: Secondary | ICD-10-CM

## 2013-05-18 DIAGNOSIS — I4891 Unspecified atrial fibrillation: Secondary | ICD-10-CM

## 2013-05-18 DIAGNOSIS — Z5181 Encounter for therapeutic drug level monitoring: Secondary | ICD-10-CM

## 2013-05-18 LAB — POCT INR: INR: 2.2

## 2013-06-06 ENCOUNTER — Encounter: Payer: Self-pay | Admitting: Internal Medicine

## 2013-06-06 ENCOUNTER — Ambulatory Visit (INDEPENDENT_AMBULATORY_CARE_PROVIDER_SITE_OTHER): Payer: Medicare Other | Admitting: Internal Medicine

## 2013-06-06 ENCOUNTER — Encounter: Payer: Medicare Other | Admitting: Internal Medicine

## 2013-06-06 VITALS — BP 159/84 | HR 70 | Ht 62.0 in | Wt 279.0 lb

## 2013-06-06 DIAGNOSIS — I10 Essential (primary) hypertension: Secondary | ICD-10-CM

## 2013-06-06 DIAGNOSIS — I4891 Unspecified atrial fibrillation: Secondary | ICD-10-CM

## 2013-06-06 DIAGNOSIS — E669 Obesity, unspecified: Secondary | ICD-10-CM

## 2013-06-06 DIAGNOSIS — Z950 Presence of cardiac pacemaker: Secondary | ICD-10-CM

## 2013-06-06 DIAGNOSIS — I495 Sick sinus syndrome: Secondary | ICD-10-CM

## 2013-06-06 LAB — MDC_IDC_ENUM_SESS_TYPE_INCLINIC
Brady Statistic RA Percent Paced: 0 %
Brady Statistic RV Percent Paced: 16 %
Implantable Pulse Generator Model: 2210
Lead Channel Impedance Value: 475 Ohm
Lead Channel Pacing Threshold Amplitude: 1.25 V
Lead Channel Sensing Intrinsic Amplitude: 1.6 mV
Lead Channel Setting Pacing Amplitude: 2.5 V
Lead Channel Setting Pacing Pulse Width: 0.4 ms
Lead Channel Setting Sensing Sensitivity: 2 mV
MDC IDC MSMT BATTERY REMAINING LONGEVITY: 106.8 mo
MDC IDC MSMT BATTERY VOLTAGE: 2.96 V
MDC IDC MSMT LEADCHNL RV PACING THRESHOLD PULSEWIDTH: 0.4 ms
MDC IDC MSMT LEADCHNL RV SENSING INTR AMPL: 10.6 mV
MDC IDC PG SERIAL: 2339600
MDC IDC SESS DTM: 20150311144346

## 2013-06-06 NOTE — Patient Instructions (Signed)
Continue all current medications. Your physician wants you to follow up in:  1 year.  You will receive a reminder letter in the mail one-two months in advance.  If you don't receive a letter, please call our office to schedule the follow up appointment - Dr. Rayann Heman

## 2013-06-06 NOTE — Progress Notes (Signed)
PCP: Octavio Graves, DO  Megan [name] is a 68 y.o. female who presents today for routine electrophysiology followup.  Since last being seen in our clinic, the patient reports doing very well.  She has not made any progress with weight reduction.  She is being treated for lymphedema.  Today, she denies symptoms of palpitations, chest pain, shortness of breath,  dizziness, presyncope, or syncope.  The patient is otherwise without complaint today.   Past Medical History  Diagnosis Date  . Hypertension   . Hyperlipidemia   . GERD (gastroesophageal reflux disease)   . Persistent atrial fibrillation      post-termination pauses with afib s/p PPM  . Degenerative joint disease   . Acute diastolic heart failure   . Sinoatrial node dysfunction     s/p PPM  . Congestive heart failure, unspecified   . Unspecified hypothyroidism   . Anxiety state, unspecified   . Unspecified venous (peripheral) insufficiency   . Obesity   . Sleep apnea     compliant with CPAP   Past Surgical History  Procedure Laterality Date  . Pacemaker insertion      SJM by JA    Current Outpatient Prescriptions  Medication Sig Dispense Refill  . ALPRAZolam (XANAX) 0.25 MG tablet Take 0.25 mg by mouth at bedtime as needed.        Marland Kitchen atorvastatin (LIPITOR) 40 MG tablet Take 40 mg by mouth at bedtime.       . cholecalciferol (VITAMIN D) 1000 UNITS tablet Take 50,000 Units by mouth once a week. 50,000 weekly      . diltiazem (CARDIZEM CD) 120 MG 24 hr capsule Take 3 capsules (360 mg total) by mouth daily.  100 capsule  3  . esomeprazole (NEXIUM) 40 MG capsule Take 40 mg by mouth daily before breakfast.        . furosemide (LASIX) 20 MG tablet Take 20 mg by mouth as needed.        Marland Kitchen levothyroxine (SYNTHROID, LEVOTHROID) 150 MCG tablet Take 150 mcg by mouth daily.        . metoprolol (LOPRESSOR) 100 MG tablet TAKE ONE TABLET BY MOUTH TWICE DAILY  60 tablet  5  . warfarin (COUMADIN) 3 MG tablet TAKE AS DIRECTED  40 tablet   3   No current facility-administered medications for this visit.    Physical Exam: Filed Vitals:   06/06/13 1119  BP: 159/84  Pulse: 70  Height: 5\' 2"  (1.575 m)  Weight: 279 lb (126.554 kg)  SpO2: 93%    GEN- The patient is well appearing, alert and oriented x 3 today.   Head- normocephalic, atraumatic Eyes-  Sclera clear, conjunctiva pink Ears- hearing intact Oropharynx- clear Lungs- Clear to ausculation bilaterally, normal work of breathing Chest- pacemaker pocket is well healed Heart- irregular rate and rhythm  GI- soft, NT, ND, + BS Extremities- no clubbing, cyanosis, + edema  Pacemaker interrogation- reviewed in detail today,  See PACEART report  Assessment and Plan:  1. Permanent afib Stable Continue long term anticoagulation  2. Tachy/brady Normal pacemaker function See Pace Art report No changes today  3. Obesity Weight loss is strongly advised  4. HTN Above goal She has not taken her medicine today She reports that her blood pressure is "low" at home Compliance is encouraged  Return in 1 year

## 2013-06-08 ENCOUNTER — Ambulatory Visit (INDEPENDENT_AMBULATORY_CARE_PROVIDER_SITE_OTHER): Payer: Medicare Other | Admitting: *Deleted

## 2013-06-08 DIAGNOSIS — Z7901 Long term (current) use of anticoagulants: Secondary | ICD-10-CM

## 2013-06-08 DIAGNOSIS — I4891 Unspecified atrial fibrillation: Secondary | ICD-10-CM

## 2013-06-08 DIAGNOSIS — Z5181 Encounter for therapeutic drug level monitoring: Secondary | ICD-10-CM

## 2013-06-08 LAB — POCT INR: INR: 2.3

## 2013-06-21 ENCOUNTER — Encounter: Payer: Medicare Other | Admitting: Internal Medicine

## 2013-07-03 ENCOUNTER — Ambulatory Visit (INDEPENDENT_AMBULATORY_CARE_PROVIDER_SITE_OTHER): Payer: Medicare Other | Admitting: *Deleted

## 2013-07-03 DIAGNOSIS — I4891 Unspecified atrial fibrillation: Secondary | ICD-10-CM

## 2013-07-03 DIAGNOSIS — Z7901 Long term (current) use of anticoagulants: Secondary | ICD-10-CM

## 2013-07-03 DIAGNOSIS — Z5181 Encounter for therapeutic drug level monitoring: Secondary | ICD-10-CM

## 2013-07-03 LAB — POCT INR: INR: 2.5

## 2013-07-04 ENCOUNTER — Other Ambulatory Visit: Payer: Self-pay | Admitting: Internal Medicine

## 2013-07-16 ENCOUNTER — Other Ambulatory Visit: Payer: Self-pay | Admitting: Internal Medicine

## 2013-07-26 ENCOUNTER — Telehealth: Payer: Self-pay | Admitting: Internal Medicine

## 2013-07-26 NOTE — Telephone Encounter (Signed)
Patient thinks she has missed several doses of her coumadin. Would like to know what she needs to do.

## 2013-07-26 NOTE — Telephone Encounter (Signed)
Left a message for her to call back.

## 2013-07-27 NOTE — Telephone Encounter (Signed)
Patient tells me she isn't sure if she missed any doses or not, and if she did it was not over past few days.  She was advised to continue same warfarin dose and keep her appointment in 10 days, and to make sure to not miss anymore doses until then.

## 2013-08-07 ENCOUNTER — Ambulatory Visit (INDEPENDENT_AMBULATORY_CARE_PROVIDER_SITE_OTHER): Payer: Medicare Other | Admitting: *Deleted

## 2013-08-07 DIAGNOSIS — I4891 Unspecified atrial fibrillation: Secondary | ICD-10-CM

## 2013-08-07 DIAGNOSIS — Z7901 Long term (current) use of anticoagulants: Secondary | ICD-10-CM

## 2013-08-07 DIAGNOSIS — Z5181 Encounter for therapeutic drug level monitoring: Secondary | ICD-10-CM

## 2013-08-07 LAB — POCT INR: INR: 2.5

## 2013-09-04 ENCOUNTER — Ambulatory Visit (INDEPENDENT_AMBULATORY_CARE_PROVIDER_SITE_OTHER): Payer: Medicare Other | Admitting: *Deleted

## 2013-09-04 DIAGNOSIS — Z5181 Encounter for therapeutic drug level monitoring: Secondary | ICD-10-CM

## 2013-09-04 DIAGNOSIS — Z7901 Long term (current) use of anticoagulants: Secondary | ICD-10-CM

## 2013-09-04 DIAGNOSIS — I4891 Unspecified atrial fibrillation: Secondary | ICD-10-CM

## 2013-09-04 LAB — POCT INR: INR: 2.2

## 2013-10-02 ENCOUNTER — Ambulatory Visit (INDEPENDENT_AMBULATORY_CARE_PROVIDER_SITE_OTHER): Payer: Medicare Other | Admitting: *Deleted

## 2013-10-02 DIAGNOSIS — Z5181 Encounter for therapeutic drug level monitoring: Secondary | ICD-10-CM

## 2013-10-02 DIAGNOSIS — Z7901 Long term (current) use of anticoagulants: Secondary | ICD-10-CM

## 2013-10-02 DIAGNOSIS — I4891 Unspecified atrial fibrillation: Secondary | ICD-10-CM

## 2013-10-02 LAB — POCT INR: INR: 2.5

## 2013-10-19 ENCOUNTER — Telehealth: Payer: Self-pay | Admitting: Internal Medicine

## 2013-10-19 NOTE — Telephone Encounter (Signed)
New message      Question about metoprolol

## 2013-10-19 NOTE — Telephone Encounter (Signed)
Her HR has been up and she was wondering if she can take an extra 1/2 of Metoprolol

## 2013-10-25 NOTE — Telephone Encounter (Signed)
Spoke with patient on 10/19/13 and let her know it is okay to take an additional Metoprolol if needed

## 2013-10-30 ENCOUNTER — Ambulatory Visit (INDEPENDENT_AMBULATORY_CARE_PROVIDER_SITE_OTHER): Payer: Medicare Other | Admitting: *Deleted

## 2013-10-30 DIAGNOSIS — Z5181 Encounter for therapeutic drug level monitoring: Secondary | ICD-10-CM

## 2013-10-30 DIAGNOSIS — I4891 Unspecified atrial fibrillation: Secondary | ICD-10-CM

## 2013-10-30 DIAGNOSIS — Z7901 Long term (current) use of anticoagulants: Secondary | ICD-10-CM

## 2013-10-30 LAB — POCT INR: INR: 2.9

## 2013-11-27 ENCOUNTER — Ambulatory Visit (INDEPENDENT_AMBULATORY_CARE_PROVIDER_SITE_OTHER): Payer: Medicare Other | Admitting: *Deleted

## 2013-11-27 DIAGNOSIS — Z7901 Long term (current) use of anticoagulants: Secondary | ICD-10-CM

## 2013-11-27 DIAGNOSIS — Z5181 Encounter for therapeutic drug level monitoring: Secondary | ICD-10-CM

## 2013-11-27 DIAGNOSIS — I4891 Unspecified atrial fibrillation: Secondary | ICD-10-CM

## 2013-11-27 LAB — POCT INR: INR: 2.9

## 2013-12-14 ENCOUNTER — Telehealth: Payer: Self-pay | Admitting: *Deleted

## 2013-12-14 MED ORDER — WARFARIN SODIUM 3 MG PO TABS: ORAL_TABLET | ORAL | Status: DC

## 2013-12-14 NOTE — Telephone Encounter (Signed)
WARFARIN SODIUM 3 MG PO TABS   [facility] IN [facility]

## 2013-12-25 ENCOUNTER — Ambulatory Visit (INDEPENDENT_AMBULATORY_CARE_PROVIDER_SITE_OTHER): Payer: Medicare Other | Admitting: *Deleted

## 2013-12-25 DIAGNOSIS — I4891 Unspecified atrial fibrillation: Secondary | ICD-10-CM

## 2013-12-25 DIAGNOSIS — Z5181 Encounter for therapeutic drug level monitoring: Secondary | ICD-10-CM

## 2013-12-25 DIAGNOSIS — Z7901 Long term (current) use of anticoagulants: Secondary | ICD-10-CM

## 2013-12-25 LAB — POCT INR: INR: 2.5

## 2014-01-04 ENCOUNTER — Ambulatory Visit (INDEPENDENT_AMBULATORY_CARE_PROVIDER_SITE_OTHER): Payer: Medicare Other | Admitting: *Deleted

## 2014-01-04 DIAGNOSIS — I495 Sick sinus syndrome: Secondary | ICD-10-CM

## 2014-01-04 DIAGNOSIS — I480 Paroxysmal atrial fibrillation: Secondary | ICD-10-CM

## 2014-01-04 LAB — MDC_IDC_ENUM_SESS_TYPE_INCLINIC
Brady Statistic RA Percent Paced: 0 %
Date Time Interrogation Session: 20151009115550
Lead Channel Impedance Value: 475 Ohm
Lead Channel Pacing Threshold Pulse Width: 0.4 ms
Lead Channel Sensing Intrinsic Amplitude: 1.6 mV
Lead Channel Sensing Intrinsic Amplitude: 10.6 mV
Lead Channel Setting Pacing Amplitude: 2.5 V
Lead Channel Setting Sensing Sensitivity: 2 mV
MDC IDC MSMT BATTERY REMAINING LONGEVITY: 126 mo
MDC IDC MSMT BATTERY VOLTAGE: 2.96 V
MDC IDC MSMT LEADCHNL RV PACING THRESHOLD AMPLITUDE: 1.25 V
MDC IDC PG SERIAL: 2339600
MDC IDC SET LEADCHNL RV PACING PULSEWIDTH: 0.4 ms
MDC IDC STAT BRADY RV PERCENT PACED: 14 %

## 2014-01-04 NOTE — Progress Notes (Signed)
Pacemaker check in clinic. Normal device function. No changes made. Estimated longevity 10.5 years. ROV in February with JA/[place].

## 2014-01-22 ENCOUNTER — Ambulatory Visit (INDEPENDENT_AMBULATORY_CARE_PROVIDER_SITE_OTHER): Payer: Medicare Other | Admitting: *Deleted

## 2014-01-22 DIAGNOSIS — Z5181 Encounter for therapeutic drug level monitoring: Secondary | ICD-10-CM

## 2014-01-22 DIAGNOSIS — Z7901 Long term (current) use of anticoagulants: Secondary | ICD-10-CM

## 2014-01-22 DIAGNOSIS — I4891 Unspecified atrial fibrillation: Secondary | ICD-10-CM

## 2014-01-22 LAB — POCT INR: INR: 2.5

## 2014-01-30 ENCOUNTER — Encounter: Payer: Self-pay | Admitting: Internal Medicine

## 2014-02-01 ENCOUNTER — Emergency Department (HOSPITAL_COMMUNITY): Payer: Medicare Other

## 2014-02-01 ENCOUNTER — Encounter (HOSPITAL_COMMUNITY): Payer: Self-pay | Admitting: Emergency Medicine

## 2014-02-01 ENCOUNTER — Emergency Department (HOSPITAL_COMMUNITY)
Admission: EM | Admit: 2014-02-01 | Discharge: 2014-02-01 | Disposition: A | Discharge status: Home / Self Care | Payer: Medicare Other | Attending: Emergency Medicine | Admitting: Emergency Medicine

## 2014-02-01 DIAGNOSIS — I5031 Acute diastolic (congestive) heart failure: Secondary | ICD-10-CM | POA: Diagnosis not present

## 2014-02-01 DIAGNOSIS — K219 Gastro-esophageal reflux disease without esophagitis: Secondary | ICD-10-CM | POA: Insufficient documentation

## 2014-02-01 DIAGNOSIS — F411 Generalized anxiety disorder: Secondary | ICD-10-CM | POA: Diagnosis not present

## 2014-02-01 DIAGNOSIS — M25551 Pain in right hip: Secondary | ICD-10-CM | POA: Diagnosis not present

## 2014-02-01 DIAGNOSIS — E785 Hyperlipidemia, unspecified: Secondary | ICD-10-CM | POA: Diagnosis not present

## 2014-02-01 DIAGNOSIS — G4730 Sleep apnea, unspecified: Secondary | ICD-10-CM | POA: Diagnosis not present

## 2014-02-01 DIAGNOSIS — I10 Essential (primary) hypertension: Secondary | ICD-10-CM | POA: Insufficient documentation

## 2014-02-01 DIAGNOSIS — M1990 Unspecified osteoarthritis, unspecified site: Secondary | ICD-10-CM | POA: Diagnosis not present

## 2014-02-01 DIAGNOSIS — Z7901 Long term (current) use of anticoagulants: Secondary | ICD-10-CM | POA: Insufficient documentation

## 2014-02-01 DIAGNOSIS — R102 Pelvic and perineal pain: Secondary | ICD-10-CM | POA: Insufficient documentation

## 2014-02-01 DIAGNOSIS — Z87891 Personal history of nicotine dependence: Secondary | ICD-10-CM | POA: Insufficient documentation

## 2014-02-01 DIAGNOSIS — R52 Pain, unspecified: Secondary | ICD-10-CM

## 2014-02-01 DIAGNOSIS — Z79899 Other long term (current) drug therapy: Secondary | ICD-10-CM | POA: Diagnosis not present

## 2014-02-01 LAB — URINALYSIS, ROUTINE W REFLEX MICROSCOPIC
BILIRUBIN URINE: NEGATIVE
Glucose, UA: NEGATIVE mg/dL
Hgb urine dipstick: NEGATIVE
Ketones, ur: NEGATIVE mg/dL
Nitrite: NEGATIVE
Protein, ur: NEGATIVE mg/dL
SPECIFIC GRAVITY, URINE: 1.01 (ref 1.005–1.030)
UROBILINOGEN UA: 0.2 mg/dL (ref 0.0–1.0)
pH: 5.5 (ref 5.0–8.0)

## 2014-02-01 LAB — CBC WITH DIFFERENTIAL/PLATELET
BASOS ABS: 0 10*3/uL (ref 0.0–0.1)
BASOS PCT: 0 % (ref 0–1)
Eosinophils Absolute: 0.1 10*3/uL (ref 0.0–0.7)
Eosinophils Relative: 1 % (ref 0–5)
HCT: 42.7 % (ref 36.0–46.0)
Hemoglobin: 14.2 g/dL (ref 12.0–15.0)
Lymphocytes Relative: 13 % (ref 12–46)
Lymphs Abs: 1.3 10*3/uL (ref 0.7–4.0)
MCH: 32.1 pg (ref 26.0–34.0)
MCHC: 33.3 g/dL (ref 30.0–36.0)
MCV: 96.6 fL (ref 78.0–100.0)
Monocytes Absolute: 0.9 10*3/uL (ref 0.1–1.0)
Monocytes Relative: 9 % (ref 3–12)
NEUTROS PCT: 77 % (ref 43–77)
Neutro Abs: 7.6 10*3/uL (ref 1.7–7.7)
PLATELETS: 263 10*3/uL (ref 150–400)
RBC: 4.42 MIL/uL (ref 3.87–5.11)
RDW: 14.7 % (ref 11.5–15.5)
WBC: 9.9 10*3/uL (ref 4.0–10.5)

## 2014-02-01 LAB — BASIC METABOLIC PANEL
Anion gap: 15 (ref 5–15)
BUN: 16 mg/dL (ref 6–23)
CO2: 24 mEq/L (ref 19–32)
Calcium: 9.3 mg/dL (ref 8.4–10.5)
Chloride: 102 mEq/L (ref 96–112)
Creatinine, Ser: 0.87 mg/dL (ref 0.50–1.10)
GFR, EST AFRICAN AMERICAN: 77 mL/min — AB (ref >90)
GFR, EST NON AFRICAN AMERICAN: 66 mL/min — AB (ref >90)
Glucose, Bld: 113 mg/dL — ABNORMAL HIGH (ref 70–99)
Potassium: 4.3 mEq/L (ref 3.7–5.3)
SODIUM: 141 meq/L (ref 137–147)

## 2014-02-01 LAB — PROTIME-INR
INR: 1.8 — AB (ref 0.00–1.49)
PROTHROMBIN TIME: 21 s — AB (ref 11.6–15.2)

## 2014-02-01 LAB — URINE MICROSCOPIC-ADD ON

## 2014-02-01 MED ORDER — HYDROCODONE-ACETAMINOPHEN 5-325 MG PO TABS
1.0000 | ORAL_TABLET | Freq: Once | ORAL | Status: AC
  Administered 2014-02-01: 1 via ORAL
  Filled 2014-02-01: qty 1

## 2014-02-01 MED ORDER — HYDROCODONE-ACETAMINOPHEN 5-325 MG PO TABS: 2.0000 | ORAL_TABLET | ORAL | Status: DC | PRN

## 2014-02-01 NOTE — ED Notes (Addendum)
PT c/o right hip pain x1 week. PT states the pain is shooting to her groin and pelvis. PT finished Keflex antibiotic for UTI on 01/14/14. PT denies any fall or injury to hip. PT has had difficulty walking this week.

## 2014-02-01 NOTE — Discharge Instructions (Signed)
Pelvic Pain Use the pain medication as prescribed. FOllow up with your doctor. Return to the ED if you develop new or worsening symptoms. Female pelvic pain can be caused by many different things and start from a variety of places. Pelvic pain refers to pain that is located in the lower half of the abdomen and between your hips. The pain may occur over a short period of time (acute) or may be reoccurring (chronic). The cause of pelvic pain may be related to disorders affecting the female reproductive organs (gynecologic), but it may also be related to the bladder, kidney stones, an intestinal complication, or muscle or skeletal problems. Getting help right away for pelvic pain is important, especially if there has been severe, sharp, or a sudden onset of unusual pain. It is also important to get help right away because some types of pelvic pain can be life threatening.  CAUSES  Below are only some of the causes of pelvic pain. The causes of pelvic pain can be in one of several categories.   Gynecologic.  Pelvic inflammatory disease.  Sexually transmitted infection.  Ovarian cyst or a twisted ovarian ligament (ovarian torsion).  Uterine lining that grows outside the uterus (endometriosis).  Fibroids, cysts, or tumors.  Ovulation.  Pregnancy.  Pregnancy that occurs outside the uterus (ectopic pregnancy).  Miscarriage.  Labor.  Abruption of the placenta or ruptured uterus.  Infection.  Uterine infection (endometritis).  Bladder infection.  Diverticulitis.  Miscarriage related to a uterine infection (septic abortion).  Bladder.  Inflammation of the bladder (cystitis).  Kidney stone(s).  Gastrointestinal.  Constipation.  Diverticulitis.  Neurologic.  Trauma.  Feeling pelvic pain because of mental or emotional causes (psychosomatic).  Cancers of the bowel or pelvis. EVALUATION  Your caregiver will want to take a careful history of your concerns. This includes  recent changes in your health, a careful gynecologic history of your periods (menses), and a sexual history. Obtaining your family history and medical history is also important. Your caregiver may suggest a pelvic exam. A pelvic exam will help identify the location and severity of the pain. It also helps in the evaluation of which organ system may be involved. In order to identify the cause of the pelvic pain and be properly treated, your caregiver may order tests. These tests may include:   A pregnancy test.  Pelvic ultrasonography.  An X-ray exam of the abdomen.  A urinalysis or evaluation of vaginal discharge.  Blood tests. HOME CARE INSTRUCTIONS   Only take over-the-counter or prescription medicines for pain, discomfort, or fever as directed by your caregiver.   Rest as directed by your caregiver.   Eat a balanced diet.   Drink enough fluids to make your urine clear or pale yellow, or as directed.   Avoid sexual intercourse if it causes pain.   Apply warm or cold compresses to the lower abdomen depending on which one helps the pain.   Avoid stressful situations.   Keep a journal of your pelvic pain. Write down when it started, where the pain is located, and if there are things that seem to be associated with the pain, such as food or your menstrual cycle.  Follow up with your caregiver as directed.  SEEK MEDICAL CARE IF:  Your medicine does not help your pain.  You have abnormal vaginal discharge. SEEK IMMEDIATE MEDICAL CARE IF:   You have heavy bleeding from the vagina.   Your pelvic pain increases.   You feel light-headed or faint.  You have chills.   You have pain with urination or blood in your urine.   You have uncontrolled diarrhea or vomiting.   You have a fever or persistent symptoms for more than 3 days.  You have a fever and your symptoms suddenly get worse.   You are being physically or sexually abused.  MAKE SURE  YOU:  Understand these instructions.  Will watch your condition.  Will get help if you are not doing well or get worse. Document Released: 02/10/2004 Document Revised: 07/30/2013 Document Reviewed: 07/05/2011 [facility] Patient Information 2015 [place], [place]. This information is not intended to replace advice given to you by your health care provider. Make sure you discuss any questions you have with your health care provider.

## 2014-02-01 NOTE — ED Provider Notes (Signed)
CSN: [id number]     Arrival date & time 02/01/14  1403 History  This chart was scribed for Ezequiel Essex, MD by Tula Nakayama, ED Scribe. This patient was seen in room APA03/APA03 and the patient's care was started at 5:01 PM.     Chief Complaint  Patient presents with  . Hip Pain   The history is provided by the patient. No language interpreter was used.    HPI Comments: Megan [name] is a 68 y.o. female with history of arthritis, sleep apnea, hyperlipidemia, hyperthyroidism, lymphedema, and HTN who presents to the Emergency Department complaining of constant, gradually worsening, right hip pain that started one week ago and that radiates to right groin starting yesterday. She states pain occurs with walking and movement, but denies any current pain while at rest. Pt tried Tylenol with no relief to symptoms. She denies recent falls and trauma to area. Pt has history of sciatica and hip pain. She has pacemaker for AFib and currently takes Coumadin. Pt denies weakness, numbness, fevers, vomiting, constipation, incontinence, dizziness, abdominal pain, and back pain as associated symptoms.  Past Medical History  Diagnosis Date  . Hypertension   . Hyperlipidemia   . GERD (gastroesophageal reflux disease)   . Persistent atrial fibrillation      post-termination pauses with afib s/p PPM  . Degenerative joint disease   . Acute diastolic heart failure   . Sinoatrial node dysfunction     s/p PPM  . Congestive heart failure, unspecified   . Unspecified hypothyroidism   . Anxiety state, unspecified   . Unspecified venous (peripheral) insufficiency   . Obesity   . Sleep apnea     compliant with CPAP   Past Surgical History  Procedure Laterality Date  . Pacemaker insertion      SJM by JA   No family history on file. History  Substance Use Topics  . Smoking status: Former Smoker    Quit date: 03/29/1978  . Smokeless tobacco: Not on file  . Alcohol Use: No   OB History    No data  available     Review of Systems  10 Systems reviewed and all are negative for acute change except as noted in the HPI.    Allergies  Review of patient's allergies indicates no known allergies.  Home Medications   Prior to Admission medications   Medication Sig Start Date End Date Taking? Authorizing Provider  acetaminophen (TYLENOL) 500 MG tablet Take 500 mg by mouth every 6 (six) hours as needed for mild pain or moderate pain.   Yes Historical Provider, MD  atorvastatin (LIPITOR) 40 MG tablet Take 40 mg by mouth at bedtime.    Yes Historical Provider, MD  clotrimazole-betamethasone (LOTRISONE) cream Apply 1 application topically daily as needed (for affected area(S)).   Yes Historical Provider, MD  diltiazem (CARDIZEM CD) 120 MG 24 hr capsule Take 3 capsules (360 mg total) by mouth daily. Patient taking differently: Take 360 mg by mouth daily. Takes in the evening 12/26/12  Yes Thompson Grayer, MD  esomeprazole (NEXIUM) 40 MG capsule Take 40 mg by mouth every evening.    Yes Historical Provider, MD  furosemide (LASIX) 20 MG tablet Take 20 mg by mouth as needed for fluid.    Yes Historical Provider, MD  levothyroxine (SYNTHROID, LEVOTHROID) 150 MCG tablet Take 150 mcg by mouth every morning.    Yes Historical Provider, MD  metoprolol (LOPRESSOR) 100 MG tablet Take 100 mg by mouth 2 (two) times daily.  Yes Historical Provider, MD  Vitamin D, Ergocalciferol, (DRISDOL) 50000 UNITS CAPS capsule Take 50,000 Units by mouth every 7 (seven) days.   Yes Historical Provider, MD  warfarin (COUMADIN) 3 MG tablet Take 1 tablet daily except 1 1/2 tablets on Sundays and Thursdays Patient taking differently: Take 3-4.5 mg by mouth See admin instructions. Take 1 tablet daily except 1 1/2 tablets on Sundays and Thursdays 12/14/13  Yes Thompson Grayer, MD  ALPRAZolam Duanne Moron) 0.25 MG tablet Take 0.25 mg by mouth at bedtime as needed for anxiety or sleep.     Historical Provider, MD  cholecalciferol (VITAMIN D)  1000 UNITS tablet Take 50,000 Units by mouth once a week. 50,000 weekly    Historical Provider, MD  HYDROcodone-acetaminophen (NORCO/VICODIN) 5-325 MG per tablet Take 2 tablets by mouth every 4 (four) hours as needed. 02/01/14   Ezequiel Essex, MD  metoprolol (LOPRESSOR) 100 MG tablet TAKE ONE TABLET BY MOUTH TWICE DAILY Patient not taking: Reported on 02/01/2014 07/16/13   Thompson Grayer, MD   BP 139/91 mmHg  Pulse 78  Temp(Src) 97.8 F (36.6 C) (Oral)  Resp 18  Ht 5\' 2"  (1.575 m)  Wt 280 lb (127.007 kg)  BMI 51.20 kg/m2  SpO2 100% Physical Exam  Constitutional: She is oriented to person, place, and time. She appears well-developed and well-nourished. No distress.  morbidly obese  HENT:  Head: Normocephalic and atraumatic.  Mouth/Throat: Oropharynx is clear and moist. No oropharyngeal exudate.  Eyes: Conjunctivae and EOM are normal. Pupils are equal, round, and reactive to light.  Neck: Normal range of motion. Neck supple.  No meningismus.  Cardiovascular: Normal rate, regular rhythm, normal heart sounds and intact distal pulses.   No murmur heard. Pulmonary/Chest: Effort normal and breath sounds normal. No respiratory distress.  Abdominal: Soft. There is no tenderness. There is no rebound and no guarding.  Genitourinary: Vagina normal.  Chaperone present. Normal external genitalia. No palpable masses. Limited by body habitus.  Musculoskeletal: Normal range of motion. She exhibits no edema or tenderness.  Tenderness to ROM of right hip; no shortening, no external rotation. Intact DP/DT pulses.  Erythema to bilateral groins with foul smelling odor. Skin tear to left groin fold. No T/L-spine pain.   Neurological: She is alert and oriented to person, place, and time. No cranial nerve deficit. She exhibits normal muscle tone. Coordination normal.  No ataxia on finger to nose bilaterally. No pronator drift. 5/5 strength throughout. CN 2-12 intact. Negative Romberg. Equal grip strength.  Sensation intact. Gait is normal.   Skin: Skin is warm.  Psychiatric: She has a normal mood and affect. Her behavior is normal.  Nursing note and vitals reviewed.  Chaperone (scribe) was present for exam which was performed with no discomfort or complications.   ED Course  Procedures (including critical care time)  DIAGNOSTIC STUDIES: Oxygen Saturation is 100% on RA, normal by my interpretation.    COORDINATION OF CARE: 5:15 PM Discussed treatment plan with pt at bedside and pt agreed to plan.   Labs Review Labs Reviewed  URINALYSIS, ROUTINE W REFLEX MICROSCOPIC - Abnormal; Notable for the following:    APPearance HAZY (*)    Leukocytes, UA SMALL (*)    All other components within normal limits  BASIC METABOLIC PANEL - Abnormal; Notable for the following:    Glucose, Bld 113 (*)    GFR calc non Af Amer 66 (*)    GFR calc Af Amer 77 (*)    All other components within normal limits  PROTIME-INR - Abnormal; Notable for the following:    Prothrombin Time 21.0 (*)    INR 1.80 (*)    All other components within normal limits  URINE MICROSCOPIC-ADD ON - Abnormal; Notable for the following:    Bacteria, UA FEW (*)    All other components within normal limits  CBC WITH DIFFERENTIAL    Imaging Review Ct Abdomen Pelvis Wo Contrast  02/01/2014   CLINICAL DATA:  Right hip pain radiating to right groin  EXAM: CT ABDOMEN AND PELVIS WITHOUT CONTRAST  TECHNIQUE: Multidetector CT imaging of the abdomen and pelvis was performed following the standard protocol without IV contrast.  COMPARISON:  None.  FINDINGS: Lower chest:  Mild the atelectasis at the bilateral lung bases.  Pacemaker leads, incompletely visualized.  Hepatobiliary: Unenhanced liver is unremarkable.  Layering gallstones (series 2/ image 34), without associated inflammatory changes.  Pancreas: Within normal limits.  Spleen: Within normal limits.  Adrenals/Urinary Tract: Adrenal glands are unremarkable.  Kidneys are within normal  limits. No renal calculi or hydronephrosis.  No ureteral or bladder calculi.  Bladder is within normal limits.  Stomach/Bowel: Stomach is unremarkable.  No evidence of bowel obstruction.  Normal appendix.  Vascular/Lymphatic: Atherosclerotic calcifications of the abdominal aorta and branch vessels.  No suspicious abdominopelvic lymphadenopathy.  Reproductive: Uterus is unremarkable.  No adnexal masses.  Other: No abdominopelvic ascites.  Tiny fat containing right inguinal hernia (series 2/image 90).  Musculoskeletal: Degenerative changes of the visualized thoracolumbar spine.  IMPRESSION: No renal, ureteral, or bladder calculi.  No hydronephrosis.  No evidence of bowel obstruction.  Normal appendix.  Tiny fat containing right inguinal hernia.   Electronically Signed   By: Julian Hy M.D.   On: 02/01/2014 19:17   Dg Hip Complete Right  02/01/2014   CLINICAL DATA:  Right hip pain.  Pain around right groin.  EXAM: RIGHT HIP - COMPLETE 2+ VIEW  COMPARISON:  None.  FINDINGS: Hips are located. No pelvic fracture sacral fracture. There is mild narrowing of the medial joint spaces. Dedicated view of the right hip demonstrates no femoral neck fracture.  IMPRESSION: 1. No evidence of pelvic fracture hip fracture. 2. Mild osteoarthritis.   Electronically Signed   By: Suzy Bouchard M.D.   On: 02/01/2014 18:09     EKG Interpretation None      MDM   Final diagnoses:  Pain  Pelvic pain in female   Patient reports right hip pain for the past one week. Denies any falls or trauma. Pain got worse yesterday and shoots into her right groin and is worse with weightbearing. No urinary symptoms no weakness, numbness or tingling. No incontinence, fever or vomiting.  Patient on Coumadin. Hip x-ray negative. INR 1.8. UA negative.  CT negative for any bony pathology or any obstructing stones. Unremarkable bimanual exam. Patient able to ambulate.   Uncertain etiology of patient's pain. She is unable to have an  MRI due to her pacemaker. No evidence of fracture or dislocation. No evidence of intra-abdominal pathology. Question passed kidney stone versus MSK pain.  We'll treat pain and follow up with PCP.    I personally performed the services described in this documentation, which was scribed in my presence. The recorded information has been reviewed and is accurate.   Ezequiel Essex, MD 02/02/14 [phone]

## 2014-02-01 NOTE — ED Notes (Signed)
Pt ambulated with cane. Tolerated well.

## 2014-02-01 NOTE — ED Notes (Signed)
Discharge instructions given, pt demonstrated teach back and verbal understanding. No concerns voiced.  

## 2014-02-07 ENCOUNTER — Ambulatory Visit (HOSPITAL_COMMUNITY): Payer: Medicare Other | Admitting: Physical Therapy

## 2014-02-11 ENCOUNTER — Other Ambulatory Visit: Payer: Self-pay | Admitting: Internal Medicine

## 2014-02-19 ENCOUNTER — Ambulatory Visit (INDEPENDENT_AMBULATORY_CARE_PROVIDER_SITE_OTHER): Payer: Medicare Other | Admitting: *Deleted

## 2014-02-19 DIAGNOSIS — I4891 Unspecified atrial fibrillation: Secondary | ICD-10-CM

## 2014-02-19 DIAGNOSIS — Z7901 Long term (current) use of anticoagulants: Secondary | ICD-10-CM

## 2014-02-19 DIAGNOSIS — Z5181 Encounter for therapeutic drug level monitoring: Secondary | ICD-10-CM

## 2014-02-19 LAB — POCT INR: INR: 3.9

## 2014-03-12 ENCOUNTER — Ambulatory Visit (INDEPENDENT_AMBULATORY_CARE_PROVIDER_SITE_OTHER): Payer: Medicare Other | Admitting: *Deleted

## 2014-03-12 DIAGNOSIS — I4891 Unspecified atrial fibrillation: Secondary | ICD-10-CM

## 2014-03-12 DIAGNOSIS — Z5181 Encounter for therapeutic drug level monitoring: Secondary | ICD-10-CM

## 2014-03-12 DIAGNOSIS — Z7901 Long term (current) use of anticoagulants: Secondary | ICD-10-CM

## 2014-03-12 LAB — POCT INR: INR: 2.6

## 2014-03-20 ENCOUNTER — Other Ambulatory Visit: Payer: Self-pay | Admitting: Internal Medicine

## 2014-03-21 ENCOUNTER — Other Ambulatory Visit: Payer: Self-pay | Admitting: Internal Medicine

## 2014-03-26 ENCOUNTER — Telehealth: Payer: Self-pay | Admitting: *Deleted

## 2014-03-26 NOTE — Telephone Encounter (Signed)
Patient called wanting to know if she needs to be checked today due to her taking more Tylenol than she normally does.  She asked that you leave her a message if she is not home when you call,

## 2014-03-26 NOTE — Telephone Encounter (Signed)
Pt has only been taking 1 Tylenol some days for arthritis.  Not everyday.  Told pt Tylenol was drugh of choice.  States she did take 2 ASA yesterday,  Told pt to stay away for ASA and ASA containing products.  INR appt moved up to 04/02/14.  Told pt if she developed increased bruising or bleeding to call and I would see her on Thursdays 12/31.  She is in agreement.

## 2014-04-02 ENCOUNTER — Ambulatory Visit (INDEPENDENT_AMBULATORY_CARE_PROVIDER_SITE_OTHER): Payer: Medicare Other | Admitting: *Deleted

## 2014-04-02 DIAGNOSIS — Z5181 Encounter for therapeutic drug level monitoring: Secondary | ICD-10-CM

## 2014-04-02 DIAGNOSIS — Z7901 Long term (current) use of anticoagulants: Secondary | ICD-10-CM

## 2014-04-02 DIAGNOSIS — I4891 Unspecified atrial fibrillation: Secondary | ICD-10-CM

## 2014-04-02 LAB — POCT INR: INR: 2.9

## 2014-04-30 ENCOUNTER — Ambulatory Visit (INDEPENDENT_AMBULATORY_CARE_PROVIDER_SITE_OTHER): Payer: Medicare Other | Admitting: *Deleted

## 2014-04-30 DIAGNOSIS — I480 Paroxysmal atrial fibrillation: Secondary | ICD-10-CM

## 2014-04-30 DIAGNOSIS — Z5181 Encounter for therapeutic drug level monitoring: Secondary | ICD-10-CM

## 2014-04-30 DIAGNOSIS — Z7901 Long term (current) use of anticoagulants: Secondary | ICD-10-CM

## 2014-04-30 DIAGNOSIS — I4891 Unspecified atrial fibrillation: Secondary | ICD-10-CM

## 2014-04-30 LAB — POCT INR: INR: 2.2

## 2014-05-07 ENCOUNTER — Telehealth: Payer: Self-pay | Admitting: *Deleted

## 2014-05-07 NOTE — Telephone Encounter (Signed)
Patient started the antibiotic bactrim ds on Sunday. Patient said she has 5 more days left.

## 2014-05-07 NOTE — Telephone Encounter (Signed)
Pt will finish Bactrim on 2/13.  Appt made for pt to come for INR check on 05/09/14 to adjust coumadin dose if needed.  Pt in agreement.

## 2014-05-09 ENCOUNTER — Ambulatory Visit (INDEPENDENT_AMBULATORY_CARE_PROVIDER_SITE_OTHER): Payer: Medicare Other | Admitting: *Deleted

## 2014-05-09 DIAGNOSIS — I480 Paroxysmal atrial fibrillation: Secondary | ICD-10-CM

## 2014-05-09 DIAGNOSIS — I4891 Unspecified atrial fibrillation: Secondary | ICD-10-CM

## 2014-05-09 DIAGNOSIS — Z7901 Long term (current) use of anticoagulants: Secondary | ICD-10-CM

## 2014-05-09 DIAGNOSIS — Z5181 Encounter for therapeutic drug level monitoring: Secondary | ICD-10-CM

## 2014-05-09 LAB — POCT INR: INR: 4.3

## 2014-05-21 ENCOUNTER — Ambulatory Visit (INDEPENDENT_AMBULATORY_CARE_PROVIDER_SITE_OTHER): Payer: Medicare Other | Admitting: *Deleted

## 2014-05-21 DIAGNOSIS — I480 Paroxysmal atrial fibrillation: Secondary | ICD-10-CM

## 2014-05-21 DIAGNOSIS — Z5181 Encounter for therapeutic drug level monitoring: Secondary | ICD-10-CM

## 2014-05-21 DIAGNOSIS — Z7901 Long term (current) use of anticoagulants: Secondary | ICD-10-CM

## 2014-05-21 DIAGNOSIS — I4891 Unspecified atrial fibrillation: Secondary | ICD-10-CM

## 2014-05-21 LAB — POCT INR: INR: 2.1

## 2014-05-28 ENCOUNTER — Telehealth: Payer: Self-pay | Admitting: Internal Medicine

## 2014-05-28 MED ORDER — WARFARIN SODIUM 3 MG PO TABS: ORAL_TABLET | ORAL | Status: DC

## 2014-05-28 NOTE — Telephone Encounter (Signed)
Megan [name] needs refill on Warfarin 3 mg [place], [place]

## 2014-05-28 NOTE — Telephone Encounter (Signed)
Medication sent to pharmacy  

## 2014-06-11 ENCOUNTER — Ambulatory Visit (INDEPENDENT_AMBULATORY_CARE_PROVIDER_SITE_OTHER): Payer: Medicare Other | Admitting: *Deleted

## 2014-06-11 DIAGNOSIS — I4891 Unspecified atrial fibrillation: Secondary | ICD-10-CM | POA: Diagnosis not present

## 2014-06-11 DIAGNOSIS — Z5181 Encounter for therapeutic drug level monitoring: Secondary | ICD-10-CM | POA: Diagnosis not present

## 2014-06-11 DIAGNOSIS — Z7901 Long term (current) use of anticoagulants: Secondary | ICD-10-CM

## 2014-06-11 DIAGNOSIS — I480 Paroxysmal atrial fibrillation: Secondary | ICD-10-CM

## 2014-06-11 LAB — POCT INR: INR: 2.2

## 2014-06-25 ENCOUNTER — Telehealth: Payer: Self-pay | Admitting: *Deleted

## 2014-06-25 NOTE — Telephone Encounter (Signed)
Spoke with pt.  She started Bactrim DS BID x 7 days on 3/25 for UTI.  Wants advise on taking coumadin.  Told pt to take coumadin 1.5mg  tonight then take 3mg  daily thru 3/31.  She will resume regular dose of 3mg  daily except 4.5mg  on Sundays and Thursdays starting 06/28/14.  She verbalized understanding.

## 2014-07-09 ENCOUNTER — Ambulatory Visit (INDEPENDENT_AMBULATORY_CARE_PROVIDER_SITE_OTHER): Payer: Medicare Other | Admitting: *Deleted

## 2014-07-09 DIAGNOSIS — Z7901 Long term (current) use of anticoagulants: Secondary | ICD-10-CM | POA: Diagnosis not present

## 2014-07-09 DIAGNOSIS — I4891 Unspecified atrial fibrillation: Secondary | ICD-10-CM

## 2014-07-09 DIAGNOSIS — I480 Paroxysmal atrial fibrillation: Secondary | ICD-10-CM

## 2014-07-09 DIAGNOSIS — Z5181 Encounter for therapeutic drug level monitoring: Secondary | ICD-10-CM

## 2014-07-09 LAB — POCT INR: INR: 2.5

## 2014-07-25 ENCOUNTER — Telehealth: Payer: Self-pay | Admitting: *Deleted

## 2014-07-25 NOTE — Telephone Encounter (Signed)
Reviewed chart.  Last INR was 2.5. Told pt to hold coumadin Friday night then continue current dose of 3mg  daily except 4.5mg  on Sundays and Thursdays.  Has an INR appt on 5/3.  Pt verbalized understanding.

## 2014-07-30 ENCOUNTER — Ambulatory Visit (INDEPENDENT_AMBULATORY_CARE_PROVIDER_SITE_OTHER): Payer: Medicare Other | Admitting: *Deleted

## 2014-07-30 DIAGNOSIS — I4891 Unspecified atrial fibrillation: Secondary | ICD-10-CM | POA: Diagnosis not present

## 2014-07-30 DIAGNOSIS — Z5181 Encounter for therapeutic drug level monitoring: Secondary | ICD-10-CM | POA: Diagnosis not present

## 2014-07-30 DIAGNOSIS — Z7901 Long term (current) use of anticoagulants: Secondary | ICD-10-CM | POA: Diagnosis not present

## 2014-07-30 LAB — POCT INR: INR: 2.7

## 2014-08-07 ENCOUNTER — Encounter: Payer: Self-pay | Admitting: *Deleted

## 2014-08-09 ENCOUNTER — Encounter: Payer: Self-pay | Admitting: Internal Medicine

## 2014-08-09 ENCOUNTER — Ambulatory Visit (INDEPENDENT_AMBULATORY_CARE_PROVIDER_SITE_OTHER): Payer: Medicare Other | Admitting: Internal Medicine

## 2014-08-09 VITALS — BP 98/58 | HR 80 | Ht 62.0 in | Wt 277.0 lb

## 2014-08-09 DIAGNOSIS — I482 Chronic atrial fibrillation, unspecified: Secondary | ICD-10-CM

## 2014-08-09 DIAGNOSIS — I10 Essential (primary) hypertension: Secondary | ICD-10-CM

## 2014-08-09 DIAGNOSIS — E669 Obesity, unspecified: Secondary | ICD-10-CM | POA: Diagnosis not present

## 2014-08-09 DIAGNOSIS — I495 Sick sinus syndrome: Secondary | ICD-10-CM

## 2014-08-09 LAB — CUP PACEART INCLINIC DEVICE CHECK
Battery Remaining Longevity: 117.6 mo
Battery Voltage: 2.95 V
Brady Statistic RA Percent Paced: 0 %
Brady Statistic RV Percent Paced: 18 %
Lead Channel Impedance Value: 412.5 Ohm
Lead Channel Pacing Threshold Amplitude: 1 V
Lead Channel Sensing Intrinsic Amplitude: 1.6 mV
Lead Channel Setting Pacing Amplitude: 2.5 V
Lead Channel Setting Pacing Pulse Width: 0.4 ms
MDC IDC MSMT LEADCHNL RV PACING THRESHOLD PULSEWIDTH: 0.4 ms
MDC IDC MSMT LEADCHNL RV SENSING INTR AMPL: 10.6 mV
MDC IDC SESS DTM: 20160513105913
MDC IDC SET LEADCHNL RV SENSING SENSITIVITY: 2 mV
Pulse Gen Model: 2210
Pulse Gen Serial Number: 2339600

## 2014-08-09 NOTE — Patient Instructions (Signed)
Your physician recommends that you continue on your current medications as directed. Please refer to the Current Medication list given to you today. Your physician recommends that you schedule a follow-up appointment in: 1 year with Dr. Rayann Heman. You will receive a reminder letter in the mail in about 10 months reminding you to call and schedule your appointment. If you don't receive this letter, please contact our office. Please call [phone] to inquire about bariatric surgery with Dr. Hassell Done.

## 2014-08-09 NOTE — Progress Notes (Signed)
PCP: Octavio Graves, DO  Megan [name] is a 69 y.o. female who presents today for routine electrophysiology followup.  Since last being seen in our clinic, the patient reports doing well.  She has not made any progress with weight reduction.  She has stable fatigue.  Today, she denies symptoms of palpitations, chest pain, shortness of breath,  dizziness, presyncope, or syncope.  The patient is otherwise without complaint today.   Past Medical History  Diagnosis Date  . Hypertension   . Hyperlipidemia   . GERD (gastroesophageal reflux disease)   . Persistent atrial fibrillation      post-termination pauses with afib s/p PPM  . Degenerative joint disease   . Acute diastolic heart failure   . Sinoatrial node dysfunction     s/p PPM  . Congestive heart failure, unspecified   . Unspecified hypothyroidism   . Anxiety state, unspecified   . Unspecified venous (peripheral) insufficiency   . Obesity   . Sleep apnea     compliant with CPAP   Past Surgical History  Procedure Laterality Date  . Pacemaker insertion      SJM by JA    Current Outpatient Prescriptions  Medication Sig Dispense Refill  . acetaminophen (TYLENOL) 500 MG tablet Take 500 mg by mouth every 6 (six) hours as needed for mild pain or moderate pain.    Marland Kitchen ALPRAZolam (XANAX) 0.25 MG tablet Take 0.25 mg by mouth at bedtime as needed for anxiety or sleep.     Marland Kitchen atorvastatin (LIPITOR) 40 MG tablet Take 40 mg by mouth at bedtime.     . cholecalciferol (VITAMIN D) 1000 UNITS tablet Take 50,000 Units by mouth once a week. 50,000 weekly    . clotrimazole-betamethasone (LOTRISONE) cream Apply 1 application topically daily as needed (for affected area(S)).    . diltiazem (CARDIZEM CD) 120 MG 24 hr capsule TAKE 3 CAPSULES DAILY 100 capsule 1  . esomeprazole (NEXIUM) 40 MG capsule Take 40 mg by mouth as needed.     . furosemide (LASIX) 20 MG tablet Take 20 mg by mouth as needed for fluid.     Marland Kitchen levothyroxine (SYNTHROID,  LEVOTHROID) 150 MCG tablet Take 150 mcg by mouth every morning.     . metoprolol (LOPRESSOR) 100 MG tablet Take 100 mg by mouth 2 (two) times daily.    . Vitamin D, Ergocalciferol, (DRISDOL) 50000 UNITS CAPS capsule Take 50,000 Units by mouth every 7 (seven) days.    Marland Kitchen warfarin (COUMADIN) 3 MG tablet Take 1 tablet daily except 1 1/2 tablets on Sundays and Thursdays 45 tablet 3   No current facility-administered medications for this visit.    Physical Exam: Filed Vitals:   08/09/14 1037  BP: 98/58  Pulse: 80  Height: 5\' 2"  (1.575 m)  Weight: 277 lb (125.646 kg)  SpO2: 99%    GEN- The patient is overweight appearing, alert and oriented x 3 today.   Head- normocephalic, atraumatic Eyes-  Sclera clear, conjunctiva pink Ears- hearing intact Oropharynx- clear Lungs- Clear to ausculation bilaterally, normal work of breathing Chest- pacemaker pocket is well healed Heart- irregular rate and rhythm  GI- soft, NT, ND, + BS Extremities- no clubbing, cyanosis, + edema  Pacemaker interrogation- reviewed in detail today,  See PACEART report  Assessment and Plan:  1. Permanent afib Stable Continue long term anticoagulation I have offered AV nodal ablation to allow [place] to reduce her medication burden. I think that she would clinically improve.  She declines at this time.  2. Tachy/brady Normal pacemaker function See Pace Art report No changes today  3. Obesity Weight loss is strongly advised She has made no progress in the years that I have known her I had a long discussion today.  I will refer to Dr Hassell Done at [facility] for weight loss surgery evaluation  4. HTN Above goal Stable No change required today  Merlin Return in 1 year

## 2014-08-27 ENCOUNTER — Ambulatory Visit (INDEPENDENT_AMBULATORY_CARE_PROVIDER_SITE_OTHER): Payer: Medicare Other | Admitting: *Deleted

## 2014-08-27 DIAGNOSIS — Z5181 Encounter for therapeutic drug level monitoring: Secondary | ICD-10-CM

## 2014-08-27 DIAGNOSIS — Z7901 Long term (current) use of anticoagulants: Secondary | ICD-10-CM

## 2014-08-27 DIAGNOSIS — I4891 Unspecified atrial fibrillation: Secondary | ICD-10-CM

## 2014-08-27 LAB — POCT INR: INR: 1.9

## 2014-09-03 NOTE — Patient Instructions (Signed)
Your procedure is scheduled on: 09/09/14   Report to [facility] at 7:00 AM.  Call this number if you have problems the morning of surgery: [phone]   Remember:   Do not eat food or drink liquids after midnight.   Take these medicines the morning of surgery with A SIP OF WATER: Diltiazem, Nexium, Levothyroxine and Metoprolol.   Do not wear jewelry, make-up or nail polish.  Do not wear lotions, powders, or perfumes. You may wear deodorant.  Do not bring valuables to the hospital.  [facility] is not responsible for any belongings or valuables.               Contacts, dentures or bridgework may not be worn into surgery.               Patients discharged the day of surgery will not be allowed to drive home.   Special Instructions: Start using your eye drops prior to surgery as directed by your eye doctor.   Please read over the following fact sheets that you were given: Anesthesia Post-op Instructions and Care and Recovery After Surgery     Cataract Surgery  A cataract is a clouding of the lens of the eye. When a lens becomes cloudy, vision is reduced based on the degree and nature of the clouding. Surgery may be needed to improve vision. Surgery removes the cloudy lens and usually replaces it with a substitute lens (intraocular lens, IOL). LET YOUR EYE DOCTOR KNOW ABOUT:  Allergies to food or medicine.  Medicines taken including herbs, eyedrops, over-the-counter medicines, and creams.  Use of steroids (by mouth or creams).  Previous problems with anesthetics or numbing medicine.  History of bleeding problems or blood clots.  Previous surgery.  Other health problems, including diabetes and kidney problems.  Possibility of pregnancy, if this applies. RISKS AND COMPLICATIONS  Infection.  Inflammation of the eyeball (endophthalmitis) that can spread to both eyes (sympathetic ophthalmia).  Poor wound healing.  If an IOL is inserted, it can later fall out of proper  position. This is very uncommon.  Clouding of the part of your eye that holds an IOL in place. This is called an "after-cataract." These are uncommon, but easily treated. BEFORE THE PROCEDURE  Do not eat or drink anything except small amounts of water for 8 to 12 before your surgery, or as directed by your caregiver.  Unless you are told otherwise, continue any eyedrops you have been prescribed.  Talk to your primary caregiver about all other medicines that you take (both prescription and non-prescription). In some cases, you may need to stop or change medicines near the time of your surgery. This is most important if you are taking blood-thinning medicine.Do not stop medicines unless you are told to do so.  Arrange for someone to drive you to and from the procedure.  Do not put contact lenses in either eye on the day of your surgery. PROCEDURE There is more than one method for safely removing a cataract. Your doctor can explain the differences and help determine which is best for you. Phacoemulsification surgery is the most common form of cataract surgery.  An injection is given behind the eye or eyedrops are given to make this a painless procedure.  A small cut (incision) is made on the edge of the clear, dome-shaped surface that covers the front of the eye (cornea).  A tiny probe is painlessly inserted into the eye. This device gives off ultrasound waves that soften  and break up the cloudy center of the lens. This makes it easier for the cloudy lens to be removed by suction.  An IOL may be implanted.  The normal lens of the eye is covered by a clear capsule. Part of that capsule is intentionally left in the eye to support the IOL.  Your surgeon may or may not use stitches to close the incision. There are other forms of cataract surgery that require a larger incision and stiches to close the eye. This approach is taken in cases where the doctor feels that the cataract cannot be  easily removed using phacoemulsification. AFTER THE PROCEDURE  When an IOL is implanted, it does not need care. It becomes a permanent part of your eye and cannot be seen or felt.  Your doctor will schedule follow-up exams to check on your progress.  Review your other medicines with your doctor to see which can be resumed after surgery.  Use eyedrops or take medicine as prescribed by your doctor. Document Released: 03/04/2011 Document Revised: 06/07/2011 Document Reviewed: 03/04/2011 [facility] Patient Information 2013 [place].    PATIENT INSTRUCTIONS POST-ANESTHESIA  IMMEDIATELY FOLLOWING SURGERY:  Do not drive or operate machinery for the first twenty four hours after surgery.  Do not make any important decisions for twenty four hours after surgery or while taking narcotic pain medications or sedatives.  If you develop intractable nausea and vomiting or a severe headache please notify your doctor immediately.  FOLLOW-UP:  Please make an appointment with your surgeon as instructed. You do not need to follow up with anesthesia unless specifically instructed to do so.  WOUND CARE INSTRUCTIONS (if applicable):  Keep a dry clean dressing on the anesthesia/puncture wound site if there is drainage.  Once the wound has quit draining you may leave it open to air.  Generally you should leave the bandage intact for twenty four hours unless there is drainage.  If the epidural site drains for more than 36-48 hours please call the anesthesia department.  QUESTIONS?:  Please feel free to call your physician or the hospital operator if you have any questions, and they will be happy to assist you.

## 2014-09-04 ENCOUNTER — Other Ambulatory Visit: Payer: Medicare Other

## 2014-09-04 ENCOUNTER — Encounter (HOSPITAL_COMMUNITY)
Admission: RE | Admit: 2014-09-04 | Discharge: 2014-09-04 | Disposition: A | Discharge status: Home / Self Care | Payer: Medicare Other | Source: Ambulatory Visit | Attending: Ophthalmology | Admitting: Ophthalmology

## 2014-09-04 ENCOUNTER — Encounter (HOSPITAL_COMMUNITY): Payer: Self-pay

## 2014-09-04 DIAGNOSIS — H2512 Age-related nuclear cataract, left eye: Secondary | ICD-10-CM | POA: Insufficient documentation

## 2014-09-04 DIAGNOSIS — Z01818 Encounter for other preprocedural examination: Secondary | ICD-10-CM | POA: Insufficient documentation

## 2014-09-04 HISTORY — DX: Lymphedema, not elsewhere classified: I89.0

## 2014-09-04 HISTORY — DX: Presence of cardiac pacemaker: Z95.0

## 2014-09-04 LAB — CBC WITH DIFFERENTIAL/PLATELET
BASOS PCT: 0 % (ref 0–1)
Basophils Absolute: 0 10*3/uL (ref 0.0–0.1)
EOS ABS: 0.1 10*3/uL (ref 0.0–0.7)
Eosinophils Relative: 1 % (ref 0–5)
HEMATOCRIT: 40 % (ref 36.0–46.0)
HEMOGLOBIN: 12.9 g/dL (ref 12.0–15.0)
LYMPHS PCT: 10 % — AB (ref 12–46)
Lymphs Abs: 1.2 10*3/uL (ref 0.7–4.0)
MCH: 30.5 pg (ref 26.0–34.0)
MCHC: 32.3 g/dL (ref 30.0–36.0)
MCV: 94.6 fL (ref 78.0–100.0)
Monocytes Absolute: 1 10*3/uL (ref 0.1–1.0)
Monocytes Relative: 9 % (ref 3–12)
Neutro Abs: 9.5 10*3/uL — ABNORMAL HIGH (ref 1.7–7.7)
Neutrophils Relative %: 80 % — ABNORMAL HIGH (ref 43–77)
Platelets: 254 10*3/uL (ref 150–400)
RBC: 4.23 MIL/uL (ref 3.87–5.11)
RDW: 14.9 % (ref 11.5–15.5)
WBC: 11.8 10*3/uL — ABNORMAL HIGH (ref 4.0–10.5)

## 2014-09-04 LAB — BASIC METABOLIC PANEL
Anion gap: 9 (ref 5–15)
BUN: 21 mg/dL — AB (ref 6–20)
CO2: 25 mmol/L (ref 22–32)
CREATININE: 0.95 mg/dL (ref 0.44–1.00)
Calcium: 9 mg/dL (ref 8.9–10.3)
Chloride: 103 mmol/L (ref 101–111)
GFR calc Af Amer: >60 mL/min (ref >60)
GFR calc non Af Amer: 59 mL/min — ABNORMAL LOW (ref >60)
GLUCOSE: 110 mg/dL — AB (ref 65–99)
Potassium: 4.3 mmol/L (ref 3.5–5.1)
Sodium: 137 mmol/L (ref 135–145)

## 2014-09-04 NOTE — Pre-Procedure Instructions (Signed)
Patient given information to sign up for my chart at home. 

## 2014-09-06 MED ORDER — LIDOCAINE HCL (PF) 1 % IJ SOLN
INTRAMUSCULAR | Status: AC
  Filled 2014-09-06: qty 2

## 2014-09-06 MED ORDER — NEOMYCIN-POLYMYXIN-DEXAMETH 3.5-10000-0.1 OP SUSP
OPHTHALMIC | Status: AC
  Filled 2014-09-06: qty 5

## 2014-09-06 MED ORDER — TETRACAINE HCL 0.5 % OP SOLN
OPHTHALMIC | Status: AC
  Filled 2014-09-06: qty 2

## 2014-09-06 MED ORDER — LIDOCAINE HCL 3.5 % OP GEL
OPHTHALMIC | Status: AC
  Filled 2014-09-06: qty 1

## 2014-09-06 MED ORDER — CYCLOPENTOLATE-PHENYLEPHRINE OP SOLN OPTIME - NO CHARGE
OPHTHALMIC | Status: AC
  Filled 2014-09-06: qty 2

## 2014-09-06 MED ORDER — PHENYLEPHRINE HCL 2.5 % OP SOLN
OPHTHALMIC | Status: AC
  Filled 2014-09-06: qty 15

## 2014-09-09 ENCOUNTER — Ambulatory Visit (HOSPITAL_COMMUNITY): Payer: Medicare Other | Admitting: Anesthesiology

## 2014-09-09 ENCOUNTER — Encounter (HOSPITAL_COMMUNITY): Payer: Self-pay | Admitting: *Deleted

## 2014-09-09 ENCOUNTER — Ambulatory Visit (HOSPITAL_COMMUNITY)
Admission: RE | Admit: 2014-09-09 | Discharge: 2014-09-09 | Disposition: A | Discharge status: Home / Self Care | Payer: Medicare Other | Source: Ambulatory Visit | Attending: Ophthalmology | Admitting: Ophthalmology

## 2014-09-09 ENCOUNTER — Encounter (HOSPITAL_COMMUNITY)
Admission: RE | Disposition: A | Discharge status: Home / Self Care | Payer: Self-pay | Source: Ambulatory Visit | Attending: Ophthalmology

## 2014-09-09 DIAGNOSIS — I4891 Unspecified atrial fibrillation: Secondary | ICD-10-CM | POA: Diagnosis not present

## 2014-09-09 DIAGNOSIS — I739 Peripheral vascular disease, unspecified: Secondary | ICD-10-CM | POA: Insufficient documentation

## 2014-09-09 DIAGNOSIS — Z87891 Personal history of nicotine dependence: Secondary | ICD-10-CM | POA: Insufficient documentation

## 2014-09-09 DIAGNOSIS — Z79899 Other long term (current) drug therapy: Secondary | ICD-10-CM | POA: Diagnosis not present

## 2014-09-09 DIAGNOSIS — H25812 Combined forms of age-related cataract, left eye: Secondary | ICD-10-CM | POA: Insufficient documentation

## 2014-09-09 DIAGNOSIS — E039 Hypothyroidism, unspecified: Secondary | ICD-10-CM | POA: Diagnosis not present

## 2014-09-09 DIAGNOSIS — K219 Gastro-esophageal reflux disease without esophagitis: Secondary | ICD-10-CM | POA: Diagnosis not present

## 2014-09-09 DIAGNOSIS — G4730 Sleep apnea, unspecified: Secondary | ICD-10-CM | POA: Insufficient documentation

## 2014-09-09 DIAGNOSIS — H269 Unspecified cataract: Secondary | ICD-10-CM | POA: Diagnosis present

## 2014-09-09 DIAGNOSIS — I509 Heart failure, unspecified: Secondary | ICD-10-CM | POA: Insufficient documentation

## 2014-09-09 DIAGNOSIS — I10 Essential (primary) hypertension: Secondary | ICD-10-CM | POA: Diagnosis not present

## 2014-09-09 HISTORY — PX: CATARACT EXTRACTION W/PHACO: SHX586

## 2014-09-09 SURGERY — PHACOEMULSIFICATION, CATARACT, WITH IOL INSERTION
Anesthesia: Monitor Anesthesia Care | Site: Eye | Laterality: Left

## 2014-09-09 MED ORDER — EPINEPHRINE HCL 1 MG/ML IJ SOLN
INTRAMUSCULAR | Status: AC
  Filled 2014-09-09: qty 1

## 2014-09-09 MED ORDER — CYCLOPENTOLATE-PHENYLEPHRINE 0.2-1 % OP SOLN
1.0000 [drp] | OPHTHALMIC | Status: AC
  Administered 2014-09-09 (×3): 1 [drp] via OPHTHALMIC

## 2014-09-09 MED ORDER — LIDOCAINE HCL 3.5 % OP GEL
1.0000 "application " | Freq: Once | OPHTHALMIC | Status: AC
  Administered 2014-09-09: 1 via OPHTHALMIC

## 2014-09-09 MED ORDER — POVIDONE-IODINE 5 % OP SOLN
OPHTHALMIC | Status: DC | PRN
  Administered 2014-09-09: 1 via OPHTHALMIC

## 2014-09-09 MED ORDER — TETRACAINE HCL 0.5 % OP SOLN
1.0000 [drp] | OPHTHALMIC | Status: AC
  Administered 2014-09-09 (×3): 1 [drp] via OPHTHALMIC

## 2014-09-09 MED ORDER — LACTATED RINGERS IV SOLN
INTRAVENOUS | Status: DC | PRN
  Administered 2014-09-09: 08:00:00 via INTRAVENOUS

## 2014-09-09 MED ORDER — BSS IO SOLN
INTRAOCULAR | Status: DC | PRN
  Administered 2014-09-09: 15 mL

## 2014-09-09 MED ORDER — NEOMYCIN-POLYMYXIN-DEXAMETH 3.5-10000-0.1 OP SUSP
OPHTHALMIC | Status: DC | PRN
  Administered 2014-09-09: 2 [drp] via OPHTHALMIC

## 2014-09-09 MED ORDER — LACTATED RINGERS IV SOLN
INTRAVENOUS | Status: DC
  Administered 2014-09-09: 1000 mL via INTRAVENOUS

## 2014-09-09 MED ORDER — LIDOCAINE HCL (PF) 1 % IJ SOLN
INTRAMUSCULAR | Status: DC | PRN
  Administered 2014-09-09: .8 mL

## 2014-09-09 MED ORDER — MIDAZOLAM HCL 2 MG/2ML IJ SOLN
1.0000 mg | INTRAMUSCULAR | Status: DC | PRN
Start: 2014-09-09 — End: 2014-09-09
  Administered 2014-09-09: 2 mg via INTRAVENOUS

## 2014-09-09 MED ORDER — FENTANYL CITRATE (PF) 100 MCG/2ML IJ SOLN
25.0000 ug | INTRAMUSCULAR | Status: AC
  Administered 2014-09-09: 25 ug via INTRAVENOUS

## 2014-09-09 MED ORDER — MIDAZOLAM HCL 2 MG/2ML IJ SOLN
INTRAMUSCULAR | Status: AC
  Filled 2014-09-09: qty 2

## 2014-09-09 MED ORDER — FENTANYL CITRATE (PF) 100 MCG/2ML IJ SOLN
INTRAMUSCULAR | Status: AC
  Filled 2014-09-09: qty 2

## 2014-09-09 MED ORDER — PHENYLEPHRINE HCL 2.5 % OP SOLN
1.0000 [drp] | OPHTHALMIC | Status: AC
  Administered 2014-09-09 (×3): 1 [drp] via OPHTHALMIC

## 2014-09-09 MED ORDER — PROVISC 10 MG/ML IO SOLN
INTRAOCULAR | Status: DC | PRN
  Administered 2014-09-09: 0.85 mL via INTRAOCULAR

## 2014-09-09 MED ORDER — EPINEPHRINE HCL 1 MG/ML IJ SOLN
INTRAOCULAR | Status: DC | PRN
  Administered 2014-09-09: 500 mL

## 2014-09-09 SURGICAL SUPPLY — 12 items
CLOTH BEACON ORANGE TIMEOUT ST (SAFETY) ×2 IMPLANT
EYE SHIELD UNIVERSAL CLEAR (GAUZE/BANDAGES/DRESSINGS) ×2 IMPLANT
GLOVE BIOGEL PI IND STRL 7.0 (GLOVE) ×3 IMPLANT
GLOVE BIOGEL PI INDICATOR 7.0 (GLOVE) ×3
GOWN STRL REUS W/TWL LRG LVL3 (GOWN DISPOSABLE) ×2 IMPLANT
PAD ARMBOARD 7.5X6 YLW CONV (MISCELLANEOUS) ×2 IMPLANT
SIGHTPATH CAT PROC W REG LENS (Ophthalmic Related) ×2 IMPLANT
SYRINGE LUER LOK 1CC (MISCELLANEOUS) ×2 IMPLANT
TAPE SURG TRANSPORE 1 IN (GAUZE/BANDAGES/DRESSINGS) ×1 IMPLANT
TAPE SURGICAL TRANSPORE 1 IN (GAUZE/BANDAGES/DRESSINGS) ×1
TOWEL NATURAL 4PK STERILE (DISPOSABLE) ×2 IMPLANT
WATER STERILE IRR 250ML POUR (IV SOLUTION) ×2 IMPLANT

## 2014-09-09 NOTE — Anesthesia Procedure Notes (Signed)
Procedure Name: MAC Date/Time: 09/09/2014 9:01 AM Performed by: Andree Elk, AMY A Pre-anesthesia Checklist: Patient identified, Timeout performed, Emergency Drugs available, Suction available and Patient being monitored Oxygen Delivery Method: Nasal cannula

## 2014-09-09 NOTE — Anesthesia Postprocedure Evaluation (Signed)
  Anesthesia Post-op Note  Patient: Megan [name]  Procedure(s) Performed: Procedure(s) with comments: CATARACT EXTRACTION PHACO AND INTRAOCULAR LENS PLACEMENT (IOC) (Left) - CDE: 6.99  Patient Location: Short Stay  Anesthesia Type:MAC  Level of Consciousness: awake, alert , oriented and patient cooperative  Airway and Oxygen Therapy: Patient Spontanous Breathing  Post-op Pain: none  Post-op Assessment: Post-op Vital signs reviewed, Patient's Cardiovascular Status Stable, Respiratory Function Stable, Patent Airway, No signs of Nausea or vomiting and Pain level controlled              Post-op Vital Signs: Reviewed and stable  Last Vitals:  Filed Vitals:   09/09/14 0900  BP: 124/50  Pulse:   Temp:   Resp: 27    Complications: No apparent anesthesia complications

## 2014-09-09 NOTE — Anesthesia Preprocedure Evaluation (Signed)
Anesthesia Evaluation  Patient identified by MRN, date of birth, ID band Patient awake    Reviewed: Allergy & Precautions, NPO status , Patient's Chart, lab work & pertinent test results  Airway Mallampati: II  TM Distance: >3 FB     Dental  (+) Teeth Intact   Pulmonary sleep apnea , former smoker,  breath sounds clear to auscultation        Cardiovascular hypertension, Pt. on medications + Peripheral Vascular Disease and +CHF + dysrhythmias Atrial Fibrillation + pacemaker Rhythm:Irregular Rate:Normal     Neuro/Psych PSYCHIATRIC DISORDERS Anxiety    GI/Hepatic GERD-  ,  Endo/Other  Hypothyroidism   Renal/GU      Musculoskeletal   Abdominal   Peds  Hematology   Anesthesia Other Findings   Reproductive/Obstetrics                             Anesthesia Physical Anesthesia Plan  ASA: III  Anesthesia Plan: MAC   Post-op Pain Management:    Induction: Intravenous  Airway Management Planned: Nasal Cannula  Additional Equipment:   Intra-op Plan:   Post-operative Plan:   Informed Consent: I have reviewed the patients History and Physical, chart, labs and discussed the procedure including the risks, benefits and alternatives for the proposed anesthesia with the patient or authorized representative who has indicated his/her understanding and acceptance.     Plan Discussed with:   Anesthesia Plan Comments:         Anesthesia Quick Evaluation

## 2014-09-09 NOTE — H&P (Signed)
I have reviewed the H&P, the patient was re-examined, and I have identified no interval changes in medical condition and plan of care since the history and physical of record  

## 2014-09-09 NOTE — Op Note (Signed)
Date of Admission: 09/09/2014  Date of Surgery: 09/09/2014   Pre-Op Dx: Cataract Left Eye  Post-Op Dx: Senile Combined Cataract Left  Eye,  Dx Code T62.263  Surgeon: Tonny Branch, M.D.  Assistants: None  Anesthesia: Topical with MAC  Indications: Painless, progressive loss of vision with compromise of daily activities.  Surgery: Cataract Extraction with Intraocular lens Implant Left Eye  Discription: The patient had dilating drops and viscous lidocaine placed into the Left eye in the pre-op holding area. After transfer to the operating room, a time out was performed. The patient was then prepped and draped. Beginning with a 10 degree blade a paracentesis port was made at the surgeon's 2 o'clock position. The anterior chamber was then filled with 1% non-preserved lidocaine. This was followed by filling the anterior chamber with Provisc.  A 2.58mm keratome blade was used to make a clear corneal incision at the temporal limbus.  A bent cystatome needle was used to create a continuous tear capsulotomy. Hydrodissection was performed with balanced salt solution on a Fine canula. The lens nucleus was then removed using the phacoemulsification handpiece. Residual cortex was removed with the I&A handpiece. The anterior chamber and capsular bag were refilled with Provisc. A posterior chamber intraocular lens was placed into the capsular bag with it's injector. The implant was positioned with the Kuglan hook. The Provisc was then removed from the anterior chamber and capsular bag with the I&A handpiece. Stromal hydration of the main incision and paracentesis port was performed with BSS on a Fine canula. The wounds were tested for leak which was negative. The patient tolerated the procedure well. There were no operative complications. The patient was then transferred to the recovery room in stable condition.  Complications: None  Specimen: None  EBL: None  Prosthetic device: Hoya iSert 250, power 20.5 D, SN  [id number].

## 2014-09-09 NOTE — Transfer of Care (Signed)
Immediate Anesthesia Transfer of Care Note  Patient: Megan [name]  Procedure(s) Performed: Procedure(s) with comments: CATARACT EXTRACTION PHACO AND INTRAOCULAR LENS PLACEMENT (IOC) (Left) - CDE: 6.99  Patient Location: Short Stay  Anesthesia Type:MAC  Level of Consciousness: awake, alert , oriented and patient cooperative  Airway & Oxygen Therapy: Patient Spontanous Breathing  Post-op Assessment: Report given to RN and Post -op Vital signs reviewed and stable  Post vital signs: Reviewed and stable  Last Vitals:  Filed Vitals:   09/09/14 0900  BP: 124/50  Pulse:   Temp:   Resp: 27    Complications: No apparent anesthesia complications

## 2014-09-09 NOTE — Discharge Instructions (Signed)

## 2014-09-10 ENCOUNTER — Encounter (HOSPITAL_COMMUNITY): Payer: Self-pay | Admitting: Ophthalmology

## 2014-09-17 ENCOUNTER — Ambulatory Visit (INDEPENDENT_AMBULATORY_CARE_PROVIDER_SITE_OTHER): Payer: Medicare Other | Admitting: *Deleted

## 2014-09-17 DIAGNOSIS — Z5181 Encounter for therapeutic drug level monitoring: Secondary | ICD-10-CM

## 2014-09-17 DIAGNOSIS — Z7901 Long term (current) use of anticoagulants: Secondary | ICD-10-CM

## 2014-09-17 DIAGNOSIS — I4891 Unspecified atrial fibrillation: Secondary | ICD-10-CM | POA: Diagnosis not present

## 2014-09-17 LAB — POCT INR: INR: 2.4

## 2014-10-01 ENCOUNTER — Encounter (HOSPITAL_COMMUNITY): Payer: Medicare Other

## 2014-10-02 ENCOUNTER — Encounter (HOSPITAL_COMMUNITY)
Admission: RE | Admit: 2014-10-02 | Discharge: 2014-10-02 | Disposition: A | Discharge status: Home / Self Care | Payer: Medicare Other | Source: Ambulatory Visit | Attending: Ophthalmology | Admitting: Ophthalmology

## 2014-10-04 MED ORDER — NEOMYCIN-POLYMYXIN-DEXAMETH 3.5-10000-0.1 OP SUSP
OPHTHALMIC | Status: AC
  Filled 2014-10-04: qty 5

## 2014-10-04 MED ORDER — LIDOCAINE HCL 3.5 % OP GEL
OPHTHALMIC | Status: AC
  Filled 2014-10-04: qty 1

## 2014-10-04 MED ORDER — LIDOCAINE HCL (PF) 1 % IJ SOLN
INTRAMUSCULAR | Status: AC
Start: 2014-10-04 — End: 2014-10-04
  Filled 2014-10-04: qty 2

## 2014-10-04 MED ORDER — PHENYLEPHRINE HCL 2.5 % OP SOLN
OPHTHALMIC | Status: AC
  Filled 2014-10-04: qty 15

## 2014-10-04 MED ORDER — CYCLOPENTOLATE-PHENYLEPHRINE OP SOLN OPTIME - NO CHARGE
OPHTHALMIC | Status: AC
  Filled 2014-10-04: qty 2

## 2014-10-04 MED ORDER — TETRACAINE HCL 0.5 % OP SOLN
OPHTHALMIC | Status: AC
  Filled 2014-10-04: qty 2

## 2014-10-06 ENCOUNTER — Other Ambulatory Visit: Payer: Self-pay | Admitting: Internal Medicine

## 2014-10-07 ENCOUNTER — Other Ambulatory Visit: Payer: Self-pay

## 2014-10-07 ENCOUNTER — Ambulatory Visit (HOSPITAL_COMMUNITY): Payer: Medicare Other | Admitting: Anesthesiology

## 2014-10-07 ENCOUNTER — Ambulatory Visit (HOSPITAL_COMMUNITY)
Admission: RE | Admit: 2014-10-07 | Discharge: 2014-10-07 | Disposition: A | Discharge status: Home / Self Care | Payer: Medicare Other | Source: Ambulatory Visit | Attending: Ophthalmology | Admitting: Ophthalmology

## 2014-10-07 ENCOUNTER — Encounter (HOSPITAL_COMMUNITY)
Admission: RE | Disposition: A | Discharge status: Home / Self Care | Payer: Self-pay | Source: Ambulatory Visit | Attending: Ophthalmology

## 2014-10-07 ENCOUNTER — Encounter (HOSPITAL_COMMUNITY): Payer: Self-pay | Admitting: Emergency Medicine

## 2014-10-07 DIAGNOSIS — H25811 Combined forms of age-related cataract, right eye: Secondary | ICD-10-CM | POA: Diagnosis not present

## 2014-10-07 DIAGNOSIS — I10 Essential (primary) hypertension: Secondary | ICD-10-CM | POA: Insufficient documentation

## 2014-10-07 DIAGNOSIS — E039 Hypothyroidism, unspecified: Secondary | ICD-10-CM | POA: Insufficient documentation

## 2014-10-07 DIAGNOSIS — Z961 Presence of intraocular lens: Secondary | ICD-10-CM | POA: Insufficient documentation

## 2014-10-07 DIAGNOSIS — Z79899 Other long term (current) drug therapy: Secondary | ICD-10-CM | POA: Diagnosis not present

## 2014-10-07 DIAGNOSIS — Z7901 Long term (current) use of anticoagulants: Secondary | ICD-10-CM | POA: Diagnosis not present

## 2014-10-07 DIAGNOSIS — H40023 Open angle with borderline findings, high risk, bilateral: Secondary | ICD-10-CM | POA: Diagnosis not present

## 2014-10-07 DIAGNOSIS — G4730 Sleep apnea, unspecified: Secondary | ICD-10-CM | POA: Diagnosis not present

## 2014-10-07 DIAGNOSIS — Z950 Presence of cardiac pacemaker: Secondary | ICD-10-CM | POA: Insufficient documentation

## 2014-10-07 DIAGNOSIS — I509 Heart failure, unspecified: Secondary | ICD-10-CM | POA: Insufficient documentation

## 2014-10-07 DIAGNOSIS — K219 Gastro-esophageal reflux disease without esophagitis: Secondary | ICD-10-CM | POA: Insufficient documentation

## 2014-10-07 DIAGNOSIS — I739 Peripheral vascular disease, unspecified: Secondary | ICD-10-CM | POA: Insufficient documentation

## 2014-10-07 DIAGNOSIS — Z87891 Personal history of nicotine dependence: Secondary | ICD-10-CM | POA: Diagnosis not present

## 2014-10-07 DIAGNOSIS — E780 Pure hypercholesterolemia: Secondary | ICD-10-CM | POA: Diagnosis not present

## 2014-10-07 DIAGNOSIS — I4891 Unspecified atrial fibrillation: Secondary | ICD-10-CM | POA: Insufficient documentation

## 2014-10-07 DIAGNOSIS — H269 Unspecified cataract: Secondary | ICD-10-CM | POA: Diagnosis present

## 2014-10-07 DIAGNOSIS — F419 Anxiety disorder, unspecified: Secondary | ICD-10-CM | POA: Insufficient documentation

## 2014-10-07 HISTORY — PX: CATARACT EXTRACTION W/PHACO: SHX586

## 2014-10-07 SURGERY — PHACOEMULSIFICATION, CATARACT, WITH IOL INSERTION
Anesthesia: Monitor Anesthesia Care | Site: Eye | Laterality: Right

## 2014-10-07 MED ORDER — FENTANYL CITRATE (PF) 100 MCG/2ML IJ SOLN
INTRAMUSCULAR | Status: AC
  Filled 2014-10-07: qty 2

## 2014-10-07 MED ORDER — LIDOCAINE HCL (PF) 1 % IJ SOLN
INTRAMUSCULAR | Status: DC | PRN
  Administered 2014-10-07: .4 mL

## 2014-10-07 MED ORDER — EPINEPHRINE HCL 1 MG/ML IJ SOLN
INTRAMUSCULAR | Status: DC | PRN
  Administered 2014-10-07: 08:00:00

## 2014-10-07 MED ORDER — POVIDONE-IODINE 5 % OP SOLN
OPHTHALMIC | Status: DC | PRN
  Administered 2014-10-07: 1 via OPHTHALMIC

## 2014-10-07 MED ORDER — FENTANYL CITRATE (PF) 100 MCG/2ML IJ SOLN
25.0000 ug | INTRAMUSCULAR | Status: AC
  Administered 2014-10-07 (×2): 25 ug via INTRAVENOUS

## 2014-10-07 MED ORDER — LIDOCAINE HCL 3.5 % OP GEL
1.0000 "application " | Freq: Once | OPHTHALMIC | Status: AC
  Administered 2014-10-07: 1 via OPHTHALMIC

## 2014-10-07 MED ORDER — LACTATED RINGERS IV SOLN
INTRAVENOUS | Status: DC
  Administered 2014-10-07: 75 mL/h via INTRAVENOUS

## 2014-10-07 MED ORDER — MIDAZOLAM HCL 2 MG/2ML IJ SOLN
1.0000 mg | INTRAMUSCULAR | Status: DC | PRN
  Administered 2014-10-07: 2 mg via INTRAVENOUS

## 2014-10-07 MED ORDER — METOPROLOL TARTRATE 100 MG PO TABS: 50.0000 mg | ORAL_TABLET | Freq: Two times a day (BID) | ORAL | Status: DC

## 2014-10-07 MED ORDER — TETRACAINE HCL 0.5 % OP SOLN
1.0000 [drp] | OPHTHALMIC | Status: AC | PRN
  Administered 2014-10-07 (×3): 1 [drp] via OPHTHALMIC

## 2014-10-07 MED ORDER — NEOMYCIN-POLYMYXIN-DEXAMETH 3.5-10000-0.1 OP SUSP
OPHTHALMIC | Status: DC | PRN
  Administered 2014-10-07: 1 [drp] via OPHTHALMIC

## 2014-10-07 MED ORDER — PROVISC 10 MG/ML IO SOLN
INTRAOCULAR | Status: DC | PRN
  Administered 2014-10-07: 0.85 mL via INTRAOCULAR

## 2014-10-07 MED ORDER — BSS IO SOLN
INTRAOCULAR | Status: DC | PRN
  Administered 2014-10-07: 15 mL via INTRAOCULAR

## 2014-10-07 MED ORDER — EPINEPHRINE HCL 1 MG/ML IJ SOLN
INTRAMUSCULAR | Status: AC
  Filled 2014-10-07: qty 1

## 2014-10-07 MED ORDER — MIDAZOLAM HCL 2 MG/2ML IJ SOLN
INTRAMUSCULAR | Status: AC
  Filled 2014-10-07: qty 2

## 2014-10-07 MED ORDER — CYCLOPENTOLATE-PHENYLEPHRINE 0.2-1 % OP SOLN
1.0000 [drp] | OPHTHALMIC | Status: AC
  Administered 2014-10-07 (×3): 1 [drp] via OPHTHALMIC

## 2014-10-07 MED ORDER — PHENYLEPHRINE HCL 2.5 % OP SOLN
1.0000 [drp] | OPHTHALMIC | Status: AC
  Administered 2014-10-07 (×3): 1 [drp] via OPHTHALMIC

## 2014-10-07 SURGICAL SUPPLY — 34 items
CAPSULAR TENSION RING-AMO (OPHTHALMIC RELATED) IMPLANT
CLOTH BEACON ORANGE TIMEOUT ST (SAFETY) ×3 IMPLANT
EYE SHIELD UNIVERSAL CLEAR (GAUZE/BANDAGES/DRESSINGS) ×3 IMPLANT
GLOVE BIO SURGEON STRL SZ 6.5 (GLOVE) IMPLANT
GLOVE BIO SURGEONS STRL SZ 6.5 (GLOVE)
GLOVE BIOGEL PI IND STRL 6.5 (GLOVE) ×1 IMPLANT
GLOVE BIOGEL PI IND STRL 7.0 (GLOVE) ×1 IMPLANT
GLOVE BIOGEL PI IND STRL 7.5 (GLOVE) IMPLANT
GLOVE BIOGEL PI INDICATOR 6.5 (GLOVE) ×2
GLOVE BIOGEL PI INDICATOR 7.0 (GLOVE) ×2
GLOVE BIOGEL PI INDICATOR 7.5 (GLOVE)
GLOVE ECLIPSE 6.5 STRL STRAW (GLOVE) IMPLANT
GLOVE ECLIPSE 7.0 STRL STRAW (GLOVE) IMPLANT
GLOVE ECLIPSE 7.5 STRL STRAW (GLOVE) IMPLANT
GLOVE EXAM NITRILE LRG STRL (GLOVE) IMPLANT
GLOVE EXAM NITRILE MD LF STRL (GLOVE) IMPLANT
GLOVE SKINSENSE NS SZ6.5 (GLOVE)
GLOVE SKINSENSE NS SZ7.0 (GLOVE)
GLOVE SKINSENSE STRL SZ6.5 (GLOVE) IMPLANT
GLOVE SKINSENSE STRL SZ7.0 (GLOVE) IMPLANT
KIT VITRECTOMY (OPHTHALMIC RELATED) IMPLANT
PAD ARMBOARD 7.5X6 YLW CONV (MISCELLANEOUS) ×3 IMPLANT
PROC W NO LENS (INTRAOCULAR LENS)
PROC W SPEC LENS (INTRAOCULAR LENS)
PROCESS W NO LENS (INTRAOCULAR LENS) IMPLANT
PROCESS W SPEC LENS (INTRAOCULAR LENS) IMPLANT
RETRACTOR IRIS SIGHTPATH (OPHTHALMIC RELATED) IMPLANT
RING MALYGIN (MISCELLANEOUS) IMPLANT
SIGHTPATH CAT PROC W REG LENS (Ophthalmic Related) ×3 IMPLANT
SYRINGE LUER LOK 1CC (MISCELLANEOUS) ×3 IMPLANT
TAPE SURG TRANSPARENT 2IN (GAUZE/BANDAGES/DRESSINGS) ×1 IMPLANT
TAPE TRANSPARENT 2IN (GAUZE/BANDAGES/DRESSINGS) ×2
VISCOELASTIC ADDITIONAL (OPHTHALMIC RELATED) IMPLANT
WATER STERILE IRR 250ML POUR (IV SOLUTION) ×3 IMPLANT

## 2014-10-07 NOTE — Op Note (Signed)
Date of Admission: 10/07/2014  Date of Surgery: 10/07/2014   Pre-Op Dx: Cataract Right Eye  Post-Op Dx: Senile Combined Cataract Right  Eye,  Dx Code D40.814  Surgeon: Tonny Branch, M.D.  Assistants: None  Anesthesia: Topical with MAC  Indications: Painless, progressive loss of vision with compromise of daily activities.  Surgery: Cataract Extraction with Intraocular lens Implant Right Eye  Discription: The patient had dilating drops and viscous lidocaine placed into the Right eye in the pre-op holding area. After transfer to the operating room, a time out was performed. The patient was then prepped and draped. Beginning with a 7 degree blade a paracentesis port was made at the surgeon's 2 o'clock position. The anterior chamber was then filled with 1% non-preserved lidocaine. This was followed by filling the anterior chamber with Provisc.  A 2.57mm keratome blade was used to make a clear corneal incision at the temporal limbus.  A bent cystatome needle was used to create a continuous tear capsulotomy. Hydrodissection was performed with balanced salt solution on a Fine canula. The lens nucleus was then removed using the phacoemulsification handpiece. Residual cortex was removed with the I&A handpiece. The anterior chamber and capsular bag were refilled with Provisc. A posterior chamber intraocular lens was placed into the capsular bag with it's injector. The implant was positioned with the Kuglan hook. The Provisc was then removed from the anterior chamber and capsular bag with the I&A handpiece. Stromal hydration of the main incision and paracentesis port was performed with BSS on a Fine canula. The wounds were tested for leak which was negative. The patient tolerated the procedure well. There were no operative complications. The patient was then transferred to the recovery room in stable condition.  Complications: None  Specimen: None  EBL: None  Prosthetic device: Hoya iSert 250, power 21.5  D, SN [id number].

## 2014-10-07 NOTE — Discharge Instructions (Signed)

## 2014-10-07 NOTE — Transfer of Care (Signed)
Immediate Anesthesia Transfer of Care Note  Patient: Megan [name]  Procedure(s) Performed: Procedure(s) with comments: CATARACT EXTRACTION PHACO AND INTRAOCULAR LENS PLACEMENT RIGHT EYE (Right) - CDE:5.16  Patient Location: Short Stay  Anesthesia Type:MAC  Level of Consciousness: awake  Airway & Oxygen Therapy: Patient Spontanous Breathing  Post-op Assessment: Report given to RN  Post vital signs: Reviewed  Last Vitals:  Filed Vitals:   10/07/14 0815  BP: 129/58  Pulse:   Temp:   Resp: 30    Complications: No apparent anesthesia complications

## 2014-10-07 NOTE — H&P (Signed)
I have reviewed the H&P, the patient was re-examined, and I have identified no interval changes in medical condition and plan of care since the history and physical of record  

## 2014-10-07 NOTE — Anesthesia Postprocedure Evaluation (Signed)
  Anesthesia Post-op Note  Patient: Megan [name]  Procedure(s) Performed: Procedure(s) with comments: CATARACT EXTRACTION PHACO AND INTRAOCULAR LENS PLACEMENT RIGHT EYE (Right) - CDE:5.16  Patient Location: Short Stay  Anesthesia Type:MAC  Level of Consciousness: awake, alert  and oriented  Airway and Oxygen Therapy: Patient Spontanous Breathing  Post-op Pain: none  Post-op Assessment: Post-op Vital signs reviewed, Patient's Cardiovascular Status Stable, Respiratory Function Stable, Patent Airway and No signs of Nausea or vomiting              Post-op Vital Signs: Reviewed and stable  Last Vitals:  Filed Vitals:   10/07/14 0815  BP: 129/58  Pulse:   Temp:   Resp: 30    Complications: No apparent anesthesia complications

## 2014-10-07 NOTE — Anesthesia Preprocedure Evaluation (Signed)
Anesthesia Evaluation  Patient identified by MRN, date of birth, ID band Patient awake    Reviewed: Allergy & Precautions, NPO status , Patient's Chart, lab work & pertinent test results  Airway Mallampati: II  TM Distance: >3 FB     Dental  (+) Teeth Intact   Pulmonary sleep apnea , former smoker,  breath sounds clear to auscultation        Cardiovascular hypertension, Pt. on medications + Peripheral Vascular Disease and +CHF + dysrhythmias Atrial Fibrillation + pacemaker Rhythm:Irregular Rate:Normal     Neuro/Psych PSYCHIATRIC DISORDERS Anxiety    GI/Hepatic GERD-  ,  Endo/Other  Hypothyroidism   Renal/GU      Musculoskeletal   Abdominal   Peds  Hematology   Anesthesia Other Findings   Reproductive/Obstetrics                             Anesthesia Physical Anesthesia Plan  ASA: III  Anesthesia Plan: MAC   Post-op Pain Management:    Induction: Intravenous  Airway Management Planned: Nasal Cannula  Additional Equipment:   Intra-op Plan:   Post-operative Plan:   Informed Consent: I have reviewed the patients History and Physical, chart, labs and discussed the procedure including the risks, benefits and alternatives for the proposed anesthesia with the patient or authorized representative who has indicated his/her understanding and acceptance.     Plan Discussed with:   Anesthesia Plan Comments:         Anesthesia Quick Evaluation

## 2014-10-08 ENCOUNTER — Encounter (HOSPITAL_COMMUNITY): Payer: Self-pay | Admitting: Ophthalmology

## 2014-10-10 ENCOUNTER — Ambulatory Visit (INDEPENDENT_AMBULATORY_CARE_PROVIDER_SITE_OTHER): Payer: Medicare Other | Admitting: *Deleted

## 2014-10-10 DIAGNOSIS — Z5181 Encounter for therapeutic drug level monitoring: Secondary | ICD-10-CM | POA: Diagnosis not present

## 2014-10-10 DIAGNOSIS — I4891 Unspecified atrial fibrillation: Secondary | ICD-10-CM

## 2014-10-10 DIAGNOSIS — Z7901 Long term (current) use of anticoagulants: Secondary | ICD-10-CM | POA: Diagnosis not present

## 2014-10-10 LAB — POCT INR: INR: 1.7

## 2014-10-22 ENCOUNTER — Telehealth: Payer: Self-pay | Admitting: *Deleted

## 2014-10-22 NOTE — Telephone Encounter (Signed)
States that she started Bactrim DS 1 tablet bid on Sunday July 24th  for 10 days for UTI., Pt instructed that there is interaction between coumadin and Bactrim DS and made her an appt to be seen in coumadin clinic on Thursday July 28th and pt instructed to eat good serving of dark leafy greens today and tomorrow and she states understanding

## 2014-10-24 ENCOUNTER — Ambulatory Visit (INDEPENDENT_AMBULATORY_CARE_PROVIDER_SITE_OTHER): Payer: Medicare Other | Admitting: *Deleted

## 2014-10-24 DIAGNOSIS — Z5181 Encounter for therapeutic drug level monitoring: Secondary | ICD-10-CM

## 2014-10-24 DIAGNOSIS — Z7901 Long term (current) use of anticoagulants: Secondary | ICD-10-CM

## 2014-10-24 DIAGNOSIS — I4891 Unspecified atrial fibrillation: Secondary | ICD-10-CM

## 2014-10-24 LAB — POCT INR: INR: 1.7

## 2014-10-25 ENCOUNTER — Other Ambulatory Visit: Payer: Self-pay | Admitting: Internal Medicine

## 2014-10-29 ENCOUNTER — Ambulatory Visit (INDEPENDENT_AMBULATORY_CARE_PROVIDER_SITE_OTHER): Payer: Medicare Other | Admitting: *Deleted

## 2014-10-29 DIAGNOSIS — I4891 Unspecified atrial fibrillation: Secondary | ICD-10-CM

## 2014-10-29 DIAGNOSIS — Z5181 Encounter for therapeutic drug level monitoring: Secondary | ICD-10-CM | POA: Diagnosis not present

## 2014-10-29 DIAGNOSIS — Z7901 Long term (current) use of anticoagulants: Secondary | ICD-10-CM

## 2014-10-29 LAB — POCT INR: INR: 2.2

## 2014-10-30 ENCOUNTER — Ambulatory Visit: Payer: Self-pay | Admitting: *Deleted

## 2014-10-30 ENCOUNTER — Telehealth: Payer: Self-pay | Admitting: *Deleted

## 2014-10-30 NOTE — Telephone Encounter (Signed)
Patient wants return call regarding "coumadin check" / tg

## 2014-10-30 NOTE — Telephone Encounter (Signed)
Pt thinks she took and extra coumadin pill last night.  Pt will take regular dose of coumadin tonight and come in for INR check tomorrow.

## 2014-10-31 ENCOUNTER — Ambulatory Visit (INDEPENDENT_AMBULATORY_CARE_PROVIDER_SITE_OTHER): Payer: Medicare Other | Admitting: *Deleted

## 2014-10-31 DIAGNOSIS — Z5181 Encounter for therapeutic drug level monitoring: Secondary | ICD-10-CM

## 2014-10-31 DIAGNOSIS — I4891 Unspecified atrial fibrillation: Secondary | ICD-10-CM | POA: Diagnosis not present

## 2014-10-31 LAB — POCT INR: INR: 2.2

## 2014-11-15 ENCOUNTER — Encounter: Payer: Self-pay | Admitting: Internal Medicine

## 2014-11-19 ENCOUNTER — Ambulatory Visit (INDEPENDENT_AMBULATORY_CARE_PROVIDER_SITE_OTHER): Payer: Medicare Other | Admitting: *Deleted

## 2014-11-19 DIAGNOSIS — Z5181 Encounter for therapeutic drug level monitoring: Secondary | ICD-10-CM | POA: Diagnosis not present

## 2014-11-19 DIAGNOSIS — I4891 Unspecified atrial fibrillation: Secondary | ICD-10-CM

## 2014-11-19 DIAGNOSIS — Z7901 Long term (current) use of anticoagulants: Secondary | ICD-10-CM | POA: Diagnosis not present

## 2014-11-19 LAB — POCT INR: INR: 2

## 2014-12-03 ENCOUNTER — Other Ambulatory Visit: Payer: Self-pay | Admitting: Internal Medicine

## 2014-12-05 ENCOUNTER — Telehealth: Payer: Self-pay | Admitting: *Deleted

## 2014-12-05 NOTE — Telephone Encounter (Signed)
Started back on Bactrim DS bid on 9/1 for cellulitis in Lower leg.  Told pt to decrease coumadin to 3mg  daily until INR check on 12/10/14.  Pt verbalized understanding.

## 2014-12-10 ENCOUNTER — Ambulatory Visit (INDEPENDENT_AMBULATORY_CARE_PROVIDER_SITE_OTHER): Payer: Medicare Other | Admitting: *Deleted

## 2014-12-10 DIAGNOSIS — Z5181 Encounter for therapeutic drug level monitoring: Secondary | ICD-10-CM

## 2014-12-10 DIAGNOSIS — I4891 Unspecified atrial fibrillation: Secondary | ICD-10-CM

## 2014-12-10 LAB — POCT INR: INR: 2.1

## 2014-12-31 ENCOUNTER — Ambulatory Visit (INDEPENDENT_AMBULATORY_CARE_PROVIDER_SITE_OTHER): Payer: Medicare Other | Admitting: *Deleted

## 2014-12-31 DIAGNOSIS — I4891 Unspecified atrial fibrillation: Secondary | ICD-10-CM

## 2014-12-31 DIAGNOSIS — Z5181 Encounter for therapeutic drug level monitoring: Secondary | ICD-10-CM

## 2014-12-31 LAB — POCT INR: INR: 3.2

## 2015-01-21 ENCOUNTER — Ambulatory Visit (INDEPENDENT_AMBULATORY_CARE_PROVIDER_SITE_OTHER): Payer: Medicare Other | Admitting: *Deleted

## 2015-01-21 DIAGNOSIS — I4891 Unspecified atrial fibrillation: Secondary | ICD-10-CM

## 2015-01-21 DIAGNOSIS — Z5181 Encounter for therapeutic drug level monitoring: Secondary | ICD-10-CM

## 2015-01-21 LAB — POCT INR: INR: 2.1

## 2015-02-11 ENCOUNTER — Ambulatory Visit (INDEPENDENT_AMBULATORY_CARE_PROVIDER_SITE_OTHER): Payer: Medicare Other | Admitting: *Deleted

## 2015-02-11 DIAGNOSIS — I4891 Unspecified atrial fibrillation: Secondary | ICD-10-CM

## 2015-02-11 DIAGNOSIS — Z5181 Encounter for therapeutic drug level monitoring: Secondary | ICD-10-CM

## 2015-02-11 LAB — POCT INR: INR: 2.1

## 2015-02-21 ENCOUNTER — Other Ambulatory Visit: Payer: Self-pay | Admitting: Internal Medicine

## 2015-03-04 ENCOUNTER — Ambulatory Visit (INDEPENDENT_AMBULATORY_CARE_PROVIDER_SITE_OTHER): Payer: Medicare Other | Admitting: *Deleted

## 2015-03-04 DIAGNOSIS — I4891 Unspecified atrial fibrillation: Secondary | ICD-10-CM | POA: Diagnosis not present

## 2015-03-04 DIAGNOSIS — Z5181 Encounter for therapeutic drug level monitoring: Secondary | ICD-10-CM | POA: Diagnosis not present

## 2015-03-04 LAB — POCT INR: INR: 3

## 2015-03-18 ENCOUNTER — Ambulatory Visit (INDEPENDENT_AMBULATORY_CARE_PROVIDER_SITE_OTHER): Payer: Medicare Other | Admitting: *Deleted

## 2015-03-18 DIAGNOSIS — I4891 Unspecified atrial fibrillation: Secondary | ICD-10-CM

## 2015-03-18 DIAGNOSIS — Z5181 Encounter for therapeutic drug level monitoring: Secondary | ICD-10-CM

## 2015-03-18 DIAGNOSIS — I480 Paroxysmal atrial fibrillation: Secondary | ICD-10-CM

## 2015-03-18 LAB — POCT INR: INR: 2.1

## 2015-04-10 ENCOUNTER — Ambulatory Visit (INDEPENDENT_AMBULATORY_CARE_PROVIDER_SITE_OTHER): Payer: Medicare Other | Admitting: *Deleted

## 2015-04-10 DIAGNOSIS — I480 Paroxysmal atrial fibrillation: Secondary | ICD-10-CM

## 2015-04-10 DIAGNOSIS — Z5181 Encounter for therapeutic drug level monitoring: Secondary | ICD-10-CM

## 2015-04-10 DIAGNOSIS — I4891 Unspecified atrial fibrillation: Secondary | ICD-10-CM | POA: Diagnosis not present

## 2015-04-10 LAB — POCT INR: INR: 1.7

## 2015-04-24 ENCOUNTER — Ambulatory Visit (INDEPENDENT_AMBULATORY_CARE_PROVIDER_SITE_OTHER): Payer: Medicare Other | Admitting: *Deleted

## 2015-04-24 DIAGNOSIS — Z5181 Encounter for therapeutic drug level monitoring: Secondary | ICD-10-CM | POA: Diagnosis not present

## 2015-04-24 DIAGNOSIS — I4891 Unspecified atrial fibrillation: Secondary | ICD-10-CM

## 2015-04-24 LAB — POCT INR: INR: 1.8

## 2015-05-08 ENCOUNTER — Ambulatory Visit (INDEPENDENT_AMBULATORY_CARE_PROVIDER_SITE_OTHER): Payer: Medicare Other | Admitting: *Deleted

## 2015-05-08 DIAGNOSIS — Z5181 Encounter for therapeutic drug level monitoring: Secondary | ICD-10-CM | POA: Diagnosis not present

## 2015-05-08 DIAGNOSIS — I4891 Unspecified atrial fibrillation: Secondary | ICD-10-CM | POA: Diagnosis not present

## 2015-05-08 LAB — POCT INR: INR: 2.4

## 2015-05-15 DIAGNOSIS — Z961 Presence of intraocular lens: Secondary | ICD-10-CM | POA: Diagnosis not present

## 2015-05-15 DIAGNOSIS — H43393 Other vitreous opacities, bilateral: Secondary | ICD-10-CM | POA: Diagnosis not present

## 2015-05-15 DIAGNOSIS — H353131 Nonexudative age-related macular degeneration, bilateral, early dry stage: Secondary | ICD-10-CM | POA: Diagnosis not present

## 2015-05-27 DIAGNOSIS — I70203 Unspecified atherosclerosis of native arteries of extremities, bilateral legs: Secondary | ICD-10-CM | POA: Diagnosis not present

## 2015-05-27 DIAGNOSIS — B351 Tinea unguium: Secondary | ICD-10-CM | POA: Diagnosis not present

## 2015-05-27 DIAGNOSIS — L84 Corns and callosities: Secondary | ICD-10-CM | POA: Diagnosis not present

## 2015-05-29 ENCOUNTER — Ambulatory Visit (INDEPENDENT_AMBULATORY_CARE_PROVIDER_SITE_OTHER): Payer: Medicare Other | Admitting: *Deleted

## 2015-05-29 DIAGNOSIS — Z5181 Encounter for therapeutic drug level monitoring: Secondary | ICD-10-CM | POA: Diagnosis not present

## 2015-05-29 DIAGNOSIS — I4891 Unspecified atrial fibrillation: Secondary | ICD-10-CM

## 2015-05-29 LAB — POCT INR: INR: 2.7

## 2015-06-04 DIAGNOSIS — M8589 Other specified disorders of bone density and structure, multiple sites: Secondary | ICD-10-CM | POA: Diagnosis not present

## 2015-06-04 DIAGNOSIS — I10 Essential (primary) hypertension: Secondary | ICD-10-CM | POA: Diagnosis not present

## 2015-06-04 DIAGNOSIS — E039 Hypothyroidism, unspecified: Secondary | ICD-10-CM | POA: Diagnosis not present

## 2015-06-04 DIAGNOSIS — E7800 Pure hypercholesterolemia, unspecified: Secondary | ICD-10-CM | POA: Diagnosis not present

## 2015-06-04 DIAGNOSIS — Z79899 Other long term (current) drug therapy: Secondary | ICD-10-CM | POA: Diagnosis not present

## 2015-06-04 DIAGNOSIS — M810 Age-related osteoporosis without current pathological fracture: Secondary | ICD-10-CM | POA: Diagnosis not present

## 2015-06-04 DIAGNOSIS — G4730 Sleep apnea, unspecified: Secondary | ICD-10-CM | POA: Diagnosis not present

## 2015-06-04 DIAGNOSIS — K219 Gastro-esophageal reflux disease without esophagitis: Secondary | ICD-10-CM | POA: Diagnosis not present

## 2015-06-04 DIAGNOSIS — I4891 Unspecified atrial fibrillation: Secondary | ICD-10-CM | POA: Diagnosis not present

## 2015-06-04 DIAGNOSIS — Z780 Asymptomatic menopausal state: Secondary | ICD-10-CM | POA: Diagnosis not present

## 2015-06-09 ENCOUNTER — Ambulatory Visit (INDEPENDENT_AMBULATORY_CARE_PROVIDER_SITE_OTHER): Payer: Medicare Other | Admitting: Neurology

## 2015-06-09 ENCOUNTER — Encounter: Payer: Self-pay | Admitting: Neurology

## 2015-06-09 VITALS — BP 124/86 | HR 76 | Resp 20 | Ht 60.0 in | Wt 277.0 lb

## 2015-06-09 DIAGNOSIS — R51 Headache: Secondary | ICD-10-CM | POA: Diagnosis not present

## 2015-06-09 DIAGNOSIS — G4733 Obstructive sleep apnea (adult) (pediatric): Secondary | ICD-10-CM

## 2015-06-09 DIAGNOSIS — R519 Headache, unspecified: Secondary | ICD-10-CM | POA: Insufficient documentation

## 2015-06-09 DIAGNOSIS — Z950 Presence of cardiac pacemaker: Secondary | ICD-10-CM | POA: Diagnosis not present

## 2015-06-09 DIAGNOSIS — I481 Persistent atrial fibrillation: Secondary | ICD-10-CM

## 2015-06-09 DIAGNOSIS — I5023 Acute on chronic systolic (congestive) heart failure: Secondary | ICD-10-CM | POA: Diagnosis not present

## 2015-06-09 DIAGNOSIS — I739 Peripheral vascular disease, unspecified: Secondary | ICD-10-CM | POA: Diagnosis not present

## 2015-06-09 DIAGNOSIS — I4819 Other persistent atrial fibrillation: Secondary | ICD-10-CM

## 2015-06-09 HISTORY — DX: Obstructive sleep apnea (adult) (pediatric): G47.33

## 2015-06-09 NOTE — Progress Notes (Signed)
SLEEP MEDICINE CLINIC   Provider:  Larey Seat, M D  Referring Provider: Octavio Graves, DO Primary Care Physician:  Octavio Graves, DO  Chief Complaint  Patient presents with  . New Patient (Initial Visit)    had sleep study in 2011, pt's cpap is old, cannot be downloaded, need a new cpap, uses [facility]    HPI:  Megan [name] is a 70 y.o. female , seen here as a referral from Octavio Graves , DO , for a sleep consultation.    Chief complaint according to patient : Mrs. Foye has been diagnosed with obstructive sleep apnea in 2011 at [facility]. She has been followed by advanced home [facility] her most recent CPAP order on record was in 2013. As she is now Medicare covered she needs a new physician visit for CPAP therapy and for the prescription of a new CPAP machine that would allow therapeutic data to be downloaded. The patient has the comorbidities of atrial fibrillation, tachybradycardia syndrome, in 2014 she was diagnosed with congestive heart failure, peripheral artery disease and dependent edema as well as coronary artery disease. She also has renal insufficiency and hyperglycemia, morbid obesity, dysuria.   Given these comorbidities the patient is at high risk of continuously suffering from obstructive sleep apnea and hypoxemia.. Please note that her sleep study from 08/26/2009 was interpreted by Dr. test failure, titrated to 10 cm CPAP on a Respironics comfort select mask. Interestingly the report was not signed, the mask was not characterized as nasal, pillow, full face. And the CO2 levels were not quoted also sever supposedly measured. In September 2000 13 units Ruben Reason repeated the sleep studies he diagnosed the patient with mild obstructive sleep apnea also and AHI is again not coated. He titrated to 14 cm water pressure recommended a weight loss regimen and exercise and used a fullface mask by [employer], and the rash quadruple. He also stated that the  oxygen saturation was maintained over 91%, but her REM latency was 60 minutes and that she had for REM sleep cycles during the titration study.  The patient states that at first the higher pressures seemed to work for better more sounds sleep. But soon she developed morning headaches, headaches but they were in the morning when he woke up with a weight sometimes headaches woke her out of sleep most times the headaches were just there when she woke up.   Sleep habits are as follows:   She has not been able to use the CPAP for while and was unable to get new supplies. She is sleeping now in a recliner. She sleeps actually comfortable that seated. She does not feel that apnea wakes her now that she sleeps in a seated position. She is also due to lymphedema not very ambulatory and she uses a cane. The recliner is also the place she stays during daytime in. She falls asleep at various times during the day and does not have a set bedtime nor rise time. She does feel however that she gets sounder sleep when in a recliner. The recliner is placed in her in her living room where she watches the TV. All night TV is also watched all night. The TV is always a background noise present during the day and night. She has no rules, routines and is not inclined to change.  She wants to know if she needs a new CPAP for having been non compliant for 3 month. She is fearful because she  had developed headaches in the morning and her single trial of using CPAP again ended with severe headaches.   medical history -as explained above. The patient has significant comorbidities such as dependent edema, atrial fibrillation, coronary artery disease, hypertension, renal failure, congestive heart failure. I also reviewed her medications. Metoprolol, Synthroid, Coumadin, Lipitor, Nexium when necessary Xanax when necessary Tylenol when necessary.     Social history:  Lives with her son. Her sleep takes place in the living room. No  ETOH use, no tobacco use. Caffeine: decaffeinated coffee and a water. No soda.   Revheriew of Systems: Out of a complete 14 system review, the patient complains of only the following symptoms, and all other reviewed systems are negative.   Epworth score 6 , Fatigue severity score 51  , depression score 4   Social History   Social History  . Marital Status: Widowed    Spouse Name: N/A  . Number of Children: N/A  . Years of Education: N/A   Occupational History  . RETIRED    Social History Main Topics  . Smoking status: Former Smoker -- 0.50 packs/day for 10 years    Types: Cigarettes    Quit date: 03/29/1978  . Smokeless tobacco: Never Used  . Alcohol Use: No  . Drug Use: No  . Sexual Activity: No   Other Topics Concern  . Not on file   Social History Narrative    Family History  Problem Relation Age of Onset  . Parkinson's disease    . CVA    . Parkinson's disease Father     Past Medical History  Diagnosis Date  . Hypertension   . Hyperlipidemia   . GERD (gastroesophageal reflux disease)   . Persistent atrial fibrillation (HCC)      post-termination pauses with afib s/p PPM  . Degenerative joint disease   . Acute diastolic heart failure ([place])   . Sinoatrial node dysfunction (HCC)     s/p PPM  . Congestive heart failure, unspecified   . Unspecified hypothyroidism   . Anxiety state, unspecified   . Unspecified venous (peripheral) insufficiency   . Obesity   . Sleep apnea     compliant with CPAP  . Presence of permanent cardiac pacemaker   . Lymphedema     Past Surgical History  Procedure Laterality Date  . Pacemaker insertion      SJM by JA  . Cataract extraction w/phaco Left 09/09/2014    Procedure: CATARACT EXTRACTION PHACO AND INTRAOCULAR LENS PLACEMENT (IOC);  Surgeon: Tonny Branch, MD;  Location: [facility];  Service: Ophthalmology;  Laterality: Left;  CDE: 6.99  . Cataract extraction w/phaco Right 10/07/2014    Procedure: CATARACT EXTRACTION PHACO  AND INTRAOCULAR LENS PLACEMENT RIGHT EYE;  Surgeon: Tonny Branch, MD;  Location: [facility];  Service: Ophthalmology;  Laterality: Right;  CDE:5.16    Current Outpatient Prescriptions  Medication Sig Dispense Refill  . acetaminophen (TYLENOL) 500 MG tablet Take 500 mg by mouth every 6 (six) hours as needed for mild pain or moderate pain.    Marland Kitchen ALPRAZolam (XANAX) 0.25 MG tablet Take 0.25 mg by mouth at bedtime as needed for anxiety or sleep.     Marland Kitchen atorvastatin (LIPITOR) 40 MG tablet Take 40 mg by mouth at bedtime.     . cholecalciferol (VITAMIN D) 1000 UNITS tablet Take 50,000 Units by mouth once a week. 50,000 weekly    . clotrimazole-betamethasone (LOTRISONE) cream Apply 1 application topically daily as needed (for affected area(S)).    Marland Kitchen  diltiazem (CARDIZEM CD) 240 MG 24 hr capsule Take 240 mg by mouth daily.    Marland Kitchen esomeprazole (NEXIUM) 40 MG capsule Take 40 mg by mouth as needed (acid reflux).     . furosemide (LASIX) 20 MG tablet Take 20 mg by mouth as needed for fluid.     Marland Kitchen levothyroxine (SYNTHROID, LEVOTHROID) 150 MCG tablet Take 150 mcg by mouth every morning.     . metoprolol (LOPRESSOR) 100 MG tablet TAKE ONE-HALF TABLET BY MOUTH TWICE DAILY 30 tablet 6  . warfarin (COUMADIN) 3 MG tablet TAKE AS DIRECTED BY COUMADIN CLINIC 45 tablet 3   No current facility-administered medications for this visit.    Allergies as of 06/09/2015  . (No Known Allergies)    Vitals: BP 124/86 mmHg  Pulse 76  Resp 20  Ht 5' (1.524 m)  Wt 277 lb (125.646 kg)  BMI 54.10 kg/m2 Last Weight:  Wt Readings from Last 1 Encounters:  06/09/15 277 lb (125.646 kg)   [id number] mass index is 54.1 kg/(m^2).     Last Height:   Ht Readings from Last 1 Encounters:  06/09/15 5' (1.524 m)    Physical exam:  General: The patient is awake, alert and appears not in acute distress. The patient is well groomed. Head: Normocephalic, atraumatic. Neck is supple. Mallampati 4,  neck circumference: 15.25. Nasal airflow  unrestricted , TMJ is not  evident . Retrognathia is seen. No dentures.  Cardiovascular:  Regular rate and rhythm , without  murmurs or carotid bruit, and without distended neck veins. Respiratory: Lungs are clear to auscultation. Skin:  Without evidence of edema, or rash Trunk: BMI is . The patient's posture is not erect.    Neurologic exam : The patient is awake and alert, oriented to place and time.   Memory subjective described as intact.  Attention span & concentration ability appears normal.  Speech is fluent,  without dysarthria, dysphonia or aphasia.  Mood and affect are appropriate.  Cranial nerves: Pupils are equal and briskly reactive to light. Funduscopic exam without evidence of pallor or edema. Extraocular movements in vertical and horizontal planes intact and without nystagmus. Visual fields by finger perimetry are intact. Hearing to finger rub intact.   Facial sensation intact to fine touch.  Facial motor strength is symmetric and tongue and uvula move midline. Shoulder shrug was symmetrical.   Motor exam:  Normal tone, muscle bulk and symmetric strength in all extremities.  Sensory:   Proprioception tested in the upper extremities was normal.  Coordination: Rapid alternating movements in the fingers/hands was normal.  Finger-to-nose maneuver  normal without evidence of ataxia, dysmetria or tremor.  Gait and station: Patient walks with cane as  assistive device and is unable  to climb up to the exam table. The patient is limited in her gait.  Deep tendon reflexes: in the  upper and lower extremities are symmetric and intact. Babinski maneuver response is downgoing.  The patient was advised of the nature of the diagnosed sleep disorder , the treatment options and risks for general a health and wellness arising from not treating the condition.  I spent more than 40 minutes of face to face time with the patient. Greater than 50% of time was spent in counseling and  coordination of care. We have discussed the diagnosis and differential and I answered the patient's questions.     Assessment:  After physical and neurologic examination, review of laboratory studies,  Personal review of imaging studies, reports of other /  same  Imaging studies ,  Results of polysomnography/ neurophysiology testing and pre-existing records as far as provided in visit., my assessment is   1) I will ask Mrs. Blauer to come in for an attended sleep study. We need to document her need for CPAP to be still existing. She prefers to sleep in a recliner and he can certainly offer her a recliner here overnight. I would like to order a capnography with her sleep study. Measuring also CO2 and oxygen levels overnight related to her sleep related headaches. She may have obesity hypoventilation.  2) the patient prefers to sleep in her bed but he may use a wedge or several pillows to help prop up   3) The patient does not have an established bedtime or rise time I will ask her to come in for the earlier patient appointment and if she falls asleep late we will allow her to stay longer. Depending on her need to qualify. I will ask attending technologist also to possibly titrate her to oxygen, should she be hypoxemic rather than apnoeic.     Plan:  Treatment plan and additional workup : Sleep hygiene. Discussed need for routines.  She needs to put the TV on a timer, if she likes back ground noise use a radio or audio book.  Please remember to try to maintain good sleep hygiene, which means: Keep a regular sleep and wake schedule, try not to exercise or have a meal within 2 hours of your bedtime, try to keep your bedroom conducive for sleep, that is, cool and dark, without light distractors such as an illuminated alarm clock, and refrain from watching TV right before sleep or in the middle of the night and do not keep the TV or radio on during the night. Also, try not to use or play on electronic  devices at bedtime, such as your cell phone, tablet PC or laptop. If you like to read at bedtime on an electronic device, try to dim the background light as much as possible. Do not eat in the middle of the night.   We will request a sleep study.    We will look for leg twitching and snoring or sleep apnea.   For chronic insomnia, you are best followed by a psychiatrist and/or sleep psychologist.   We will call you with the sleep study results and make a follow up appointment if needed.        Asencion Partridge Ramzy Cappelletti MD  06/09/2015   CC: Octavio Graves, [address] [place], [place] [address]

## 2015-06-09 NOTE — Patient Instructions (Signed)
Please remember to try to maintain good sleep hygiene, which means: Keep a regular sleep and wake schedule, try not to exercise or have a meal within 2 hours of your bedtime, try to keep your bedroom conducive for sleep, that is, cool and dark, without light distractors such as an illuminated alarm clock, and refrain from watching TV right before sleep or in the middle of the night and do not keep the TV or radio on during the night. Also, try not to use or play on electronic devices at bedtime, such as your cell phone, tablet PC or laptop. If you like to read at bedtime on an electronic device, try to dim the background light as much as possible. Do not eat in the middle of the night.   We will request a sleep study.    We will look for leg twitching and snoring or sleep apnea.   For chronic insomnia, you are best followed by a psychiatrist and/or sleep psychologist.   We will call you with the sleep study results and make a follow up appointment if needed.        CPAP and BIPAP Information CPAP and BIPAP are methods of helping you breathe with the use of air pressure. CPAP stands for "continuous positive airway pressure." BIPAP stands for "bi-level positive airway pressure." In both methods, air is blown into your air passages to help keep you breathing well. With CPAP, the amount of pressure stays the same while you breathe in and out. CPAP is most commonly used for obstructive sleep apnea. For obstructive sleep apnea, CPAP works by holding your airways open so that they do not collapse when your muscles relax during sleep. BIPAP is similar to CPAP except the amount of pressure is increased when you inhale. This helps you take larger breaths. Your health care provider will recommend whether CPAP or BIPAP would be more helpful for you.  WHY ARE CPAP AND BIPAP TREATMENTS USED? CPAP or BIPAP can be helpful if you have:   Sleep apnea.   Chronic obstructive pulmonary disease (COPD).    Diseases that weaken the muscles of the chest, including muscular dystrophy or neurological diseases such as amyotrophic lateral sclerosis (ALS).  Other problems that cause breathing to be weak, abnormal, or difficult.  HOW IS CPAP OR BIPAP ADMINISTERED? Both CPAP and BIPAP are provided by a small machine with a flexible plastic tube that attaches to a plastic mask. The mask fits on your face, and air is blown into your air passages through your nose or mouth. The amount of pressure that is used to blow the air into your air passages can be set on the machine. Your health care provider will determine the pressure setting that should be used based on your individual needs. WHEN SHOULD CPAP OR BIPAP BE USED? In most cases, the mask is worn only when sleeping. Generally, you will need to wear the mask throughout the night and during the daytime if you take a nap. In a few cases involving certain medical conditions, people also need to wear the mask at other times when they are awake. Follow your health care provider's instructions for when to use the machine.  USING THE MASK  Because the mask needs to be snug, some people feel a trapped or closed-in feeling (claustrophobic) when first using the mask. You may need to get used to the mask gradually. To do this, you can first hold the mask loosely over your nose or mouth. Gradually  apply the mask more snugly. You can also gradually increase the amount of time that you use the mask.  Masks are available in various types and sizes. Some fit over your mouth and nose, and some fit over just your nose. If your mask does not fit well, talk to your health care provider about getting a different one.  If you are using a nasal mask and you tend to breathe through your mouth, a chin strap may be applied to help keep your mouth closed.   The CPAP and BIPAP machines have alarms that may sound if the mask comes off or develops a leak.  If you have trouble with  the mask, it is very important that you talk to your health care provider about finding a way to make the mask easier to tolerate. Do not stop using the mask. This could have a negative impact on your health. TIPS FOR USING THE MACHINE  Place your CPAP or BIPAP machine on a secure table or stand near an electrical outlet.   Know where the on-off switch is located on the machine.  Follow your health care provider's instructions for how to set the pressure on your machine and when you should use it.  Do not eat or drink while the CPAP or BIPAP machine is on. Food or fluids could get pushed into your lungs by the pressure of the CPAP or BIPAP.  Do not smoke. Tobacco smoke residue can damage the machine.   For home use, CPAP and BIPAP machines can be rented or purchased through home health care companies. Many different brands of machines are available. Renting a machine before purchasing may help you find out which particular machine works well for you. SEEK IMMEDIATE MEDICAL CARE IF:  You have redness or open areas around your nose or mouth where the mask fits.   You have trouble operating the CPAP or BIPAP machine.   You cannot tolerate wearing the CPAP or BIPAP mask.    This information is not intended to replace advice given to you by your health care provider. Make sure you discuss any questions you have with your health care provider.   Document Released: 12/12/2003 Document Revised: 04/05/2014 Document Reviewed: 10/12/2012 Elsevier Interactive Patient Education [employer].

## 2015-06-19 ENCOUNTER — Ambulatory Visit (INDEPENDENT_AMBULATORY_CARE_PROVIDER_SITE_OTHER): Payer: Medicare Other | Admitting: *Deleted

## 2015-06-19 DIAGNOSIS — Z5181 Encounter for therapeutic drug level monitoring: Secondary | ICD-10-CM | POA: Diagnosis not present

## 2015-06-19 DIAGNOSIS — I4891 Unspecified atrial fibrillation: Secondary | ICD-10-CM | POA: Diagnosis not present

## 2015-06-19 LAB — POCT INR: INR: 1.7

## 2015-06-29 ENCOUNTER — Ambulatory Visit (INDEPENDENT_AMBULATORY_CARE_PROVIDER_SITE_OTHER): Payer: Medicare Other | Admitting: Neurology

## 2015-06-29 DIAGNOSIS — G4733 Obstructive sleep apnea (adult) (pediatric): Secondary | ICD-10-CM

## 2015-06-29 DIAGNOSIS — R519 Headache, unspecified: Secondary | ICD-10-CM

## 2015-06-29 DIAGNOSIS — I4819 Other persistent atrial fibrillation: Secondary | ICD-10-CM

## 2015-06-29 DIAGNOSIS — I5023 Acute on chronic systolic (congestive) heart failure: Secondary | ICD-10-CM

## 2015-06-29 DIAGNOSIS — R51 Headache: Secondary | ICD-10-CM

## 2015-06-29 DIAGNOSIS — I739 Peripheral vascular disease, unspecified: Secondary | ICD-10-CM

## 2015-06-29 DIAGNOSIS — Z950 Presence of cardiac pacemaker: Secondary | ICD-10-CM

## 2015-06-29 NOTE — Sleep Study (Signed)
Please see the scanned sleep study interpretation located in the Procedure tab within the Chart Review section. 

## 2015-06-30 ENCOUNTER — Other Ambulatory Visit: Payer: Self-pay | Admitting: Internal Medicine

## 2015-07-02 ENCOUNTER — Telehealth: Payer: Self-pay

## 2015-07-02 NOTE — Telephone Encounter (Signed)
I spoke to pt regarding her sleep study results. I advised pt that her sleep study did not reveal significant sleep apnea or significant PLMS resulting in sleep disruption. Study did show insomnia, and Dr. Brett Fairy recommends a dedicated sleep psychology referral because chronic insomnia is related to sleep hygiene and psychosocial factors that can be best treated by mental health professionals. I advised pt to lose weight, diet, and exercise if not contraindicated by her other physicians. Pt states that she does not have insomnia and the reason why she could not sleep in the study was because she did not have her cpap. I advised her that there was no sleep apnea seen in her study, and therefore, insurance may not pay for a new machine. Pt insists that she cannot sleep without her cpap but she does not want to buy one. She says that she "did not sleep enough during the study to prove I have apnea." A follow up appt was made per pt request for 07/31/15. Pt was advised to call us back to reschedule that appt if she cannot make it. Pt verbalized understanding.

## 2015-07-03 ENCOUNTER — Ambulatory Visit (INDEPENDENT_AMBULATORY_CARE_PROVIDER_SITE_OTHER): Payer: Medicare Other | Admitting: *Deleted

## 2015-07-03 DIAGNOSIS — Z5181 Encounter for therapeutic drug level monitoring: Secondary | ICD-10-CM

## 2015-07-03 DIAGNOSIS — I4891 Unspecified atrial fibrillation: Secondary | ICD-10-CM

## 2015-07-03 LAB — POCT INR: INR: 2.8

## 2015-07-17 ENCOUNTER — Ambulatory Visit (INDEPENDENT_AMBULATORY_CARE_PROVIDER_SITE_OTHER): Payer: Medicare Other | Admitting: *Deleted

## 2015-07-17 DIAGNOSIS — I4891 Unspecified atrial fibrillation: Secondary | ICD-10-CM | POA: Diagnosis not present

## 2015-07-17 DIAGNOSIS — Z5181 Encounter for therapeutic drug level monitoring: Secondary | ICD-10-CM | POA: Diagnosis not present

## 2015-07-17 LAB — POCT INR: INR: 2.6

## 2015-07-21 ENCOUNTER — Other Ambulatory Visit: Payer: Self-pay | Admitting: Internal Medicine

## 2015-07-31 ENCOUNTER — Encounter: Payer: Self-pay | Admitting: Neurology

## 2015-07-31 ENCOUNTER — Ambulatory Visit (INDEPENDENT_AMBULATORY_CARE_PROVIDER_SITE_OTHER): Payer: Medicare Other | Admitting: Neurology

## 2015-07-31 VITALS — BP 140/90 | HR 70 | Resp 20 | Ht 62.0 in | Wt 283.0 lb

## 2015-07-31 DIAGNOSIS — E669 Obesity, unspecified: Secondary | ICD-10-CM | POA: Diagnosis not present

## 2015-07-31 DIAGNOSIS — I480 Paroxysmal atrial fibrillation: Secondary | ICD-10-CM | POA: Diagnosis not present

## 2015-07-31 DIAGNOSIS — G4733 Obstructive sleep apnea (adult) (pediatric): Secondary | ICD-10-CM

## 2015-07-31 DIAGNOSIS — R0601 Orthopnea: Secondary | ICD-10-CM | POA: Diagnosis not present

## 2015-07-31 NOTE — Progress Notes (Signed)
SLEEP MEDICINE CLINIC   Provider:  Larey Seat, M D  Referring Provider: Octavio Graves, DO Primary Care Physician:  Octavio Graves, DO  Chief Complaint  Patient presents with  . Follow-up    discuss sleep study results, uses cpap at home, study did not reveal apnea, does not want to sleep without cpap, rm 10,    HPI:  Megan [name] is a 70 y.o. female , seen here as a referral from Octavio Graves , DO , for a sleep consultation.    Chief complaint according to patient : Megan [name] has been diagnosed with obstructive sleep apnea in 2011 at [facility]. She has been followed by advanced home care her most recent CPAP order on record was in 2013. As she is now Medicare covered she needs a new physician visit for CPAP therapy and for the prescription of a new CPAP machine that would allow therapeutic data to be downloaded. The patient has the comorbidities of atrial fibrillation, tachybradycardia syndrome, in 2014 she was diagnosed with congestive heart failure, peripheral artery disease and dependent edema as well as coronary artery disease. She also has renal insufficiency and hyperglycemia, morbid obesity, dysuria.  Given these comorbidities the patient is at high risk of continuously suffering from obstructive sleep apnea and hypoxemia.. Please note that her sleep study from 08/26/2009 was interpreted by Dr. test failure, titrated to 10 cm CPAP on a Respironics comfort select mask. Interestingly the report was not signed, the mask was not characterized as nasal, pillow, full face. And the CO2 levels were not quoted also sever supposedly measured. In September 2000 13 units Ruben Reason repeated the sleep studies he diagnosed the patient with mild obstructive sleep apnea also and AHI is again not coated. He titrated to 14 cm water pressure recommended a weight loss regimen and exercise and used a fullface mask by [employer], and the [name] quadruple. He also stated  that the oxygen saturation was maintained over 91%, but her REM latency was 60 minutes and that she had for REM sleep cycles during the titration study.  The patient states that at first the higher pressures seemed to work for better more sounds sleep. But soon she developed morning headaches, headaches but they were in the morning when he woke up with a weight sometimes headaches woke her out of sleep most times the headaches were just there when she woke up.   Sleep habits are as follows: She has not been able to use the CPAP for while and was unable to get new supplies. She is sleeping now in a recliner. She sleeps actually comfortable that seated. She does not feel that apnea wakes her now that she sleeps in a seated position. She is also due to lymphedema not very ambulatory and she uses a cane. The recliner is also the place she stays during daytime in. She falls asleep at various times during the day and does not have a set bedtime nor rise time. She does feel however that she gets sounder sleep when in a recliner. The recliner is placed in her in her living room where she watches the TV. All night TV is also watched all night. The TV is always a background noise present during the day and night. She has no rules, routines and is not inclined to change.  She wants to know if she needs a new CPAP for having been non compliant for 3 month. She is fearful because she had developed  headaches in the morning and her single trial of using CPAP again ended with severe headaches.  Medical history -as explained above. The patient has significant comorbidities such as dependent edema, atrial fibrillation, coronary artery disease, hypertension, renal failure, congestive heart failure. I also reviewed her medications. Metoprolol, Synthroid, Coumadin, Lipitor, Nexium when necessary Xanax when necessary Tylenol when necessary.   Interval history from 07/31/2015, Megan [name] underwent a polysomnography on  06/29/2015, she had a hard time falling asleep and slept for only about 30% of the recorded time it took over an hour to fall asleep REM latency was 53.5 minutes, she had no significant apnea /hypopneaindex at 5.9. Her sleep study was performed with an elevated head of bed.  Based on the sleep study I recommend strongly that Megan [name] will use a wedge or an adjustable bed allowing her to have her thorax elevated at night. There is no longer medical necessity for CPAP use but she feels better when she uses CPAP and for this reason should continue. She endorsed only 2 points on today's geriatric depression score, 54 points on the fatigue severity score and 6 points on the Epworth sleepiness score. No follow up needed.     Social history:  Lives with her son. Her sleep takes place in the living room. No ETOH use, no tobacco use. Caffeine: decaffeinated coffee and a water. No soda.   Revheriew of Systems: Out of a complete 14 system review, the patient complains of only the following symptoms, and all other reviewed systems are negative.   Social History   Social History  . Marital Status: Widowed    Spouse Name: N/A  . Number of Children: N/A  . Years of Education: N/A   Occupational History  . RETIRED    Social History Main Topics  . Smoking status: Former Smoker -- 0.50 packs/day for 10 years    Types: Cigarettes    Quit date: 03/29/1978  . Smokeless tobacco: Never Used  . Alcohol Use: No  . Drug Use: No  . Sexual Activity: No   Other Topics Concern  . Not on file   Social History Narrative    Family History  Problem Relation Age of Onset  . Parkinson's disease    . CVA    . Parkinson's disease Father     Past Medical History  Diagnosis Date  . Hypertension   . Hyperlipidemia   . GERD (gastroesophageal reflux disease)   . Persistent atrial fibrillation (HCC)      post-termination pauses with afib s/p PPM  . Degenerative joint disease   . Acute diastolic heart  failure ([place])   . Sinoatrial node dysfunction (HCC)     s/p PPM  . Congestive heart failure, unspecified   . Unspecified hypothyroidism   . Anxiety state, unspecified   . Unspecified venous (peripheral) insufficiency   . Obesity   . Sleep apnea     compliant with CPAP  . Presence of permanent cardiac pacemaker   . Lymphedema     Past Surgical History  Procedure Laterality Date  . Pacemaker insertion      SJM by JA  . Cataract extraction w/phaco Left 09/09/2014    Procedure: CATARACT EXTRACTION PHACO AND INTRAOCULAR LENS PLACEMENT (IOC);  Surgeon: Tonny Branch, MD;  Location: [facility];  Service: Ophthalmology;  Laterality: Left;  CDE: 6.99  . Cataract extraction w/phaco Right 10/07/2014    Procedure: CATARACT EXTRACTION PHACO AND INTRAOCULAR LENS PLACEMENT RIGHT EYE;  Surgeon: Tonny Branch, MD;  Location: [facility];  Service: Ophthalmology;  Laterality: Right;  CDE:5.16    Current Outpatient Prescriptions  Medication Sig Dispense Refill  . acetaminophen (TYLENOL) 500 MG tablet Take 500 mg by mouth every 6 (six) hours as needed for mild pain or moderate pain.    Marland Kitchen ALPRAZolam (XANAX) 0.25 MG tablet Take 0.25 mg by mouth at bedtime as needed for anxiety or sleep.     Marland Kitchen atorvastatin (LIPITOR) 40 MG tablet Take 40 mg by mouth at bedtime.     . cholecalciferol (VITAMIN D) 1000 UNITS tablet Take 50,000 Units by mouth once a week. 50,000 weekly    . clotrimazole-betamethasone (LOTRISONE) cream Apply 1 application topically daily as needed (for affected area(S)).    . diltiazem (CARDIZEM CD) 240 MG 24 hr capsule Take 240 mg by mouth daily.    Marland Kitchen esomeprazole (NEXIUM) 40 MG capsule Take 40 mg by mouth as needed (acid reflux).     . furosemide (LASIX) 20 MG tablet Take 20 mg by mouth as needed for fluid.     Marland Kitchen levothyroxine (SYNTHROID, LEVOTHROID) 150 MCG tablet Take 150 mcg by mouth every morning.     . metoprolol (LOPRESSOR) 100 MG tablet TAKE ONE-HALF TABLET BY MOUTH TWICE DAILY 30 tablet 6  .  warfarin (COUMADIN) 3 MG tablet Take 1 1/2 tablets daily except 1 tablet on Mondays, Wednesdays and Fridays 45 tablet 4   No current facility-administered medications for this visit.    Allergies as of 07/31/2015  . (No Known Allergies)    Vitals: BP 140/90 mmHg  Pulse 70  Resp 20  Ht 5\' 2"  (1.575 m)  Wt 283 lb (128.368 kg)  BMI 51.75 kg/m2 Last Weight:  Wt Readings from Last 1 Encounters:  07/31/15 283 lb (128.368 kg)   [id number] mass index is 51.75 kg/(m^2).     Last Height:   Ht Readings from Last 1 Encounters:  07/31/15 5\' 2"  (1.575 m)    Physical exam:  General: The patient is awake, alert and appears not in acute distress. The patient is well groomed. Head: Normocephalic, atraumatic. Neck is supple. Mallampati 4,  neck circumference: 15.25. Nasal airflow unrestricted , TMJ is not  evident . Retrognathia is seen. No dentures.  Cardiovascular:  Regular rate and rhythm , without  murmurs or carotid bruit, and without distended neck veins. Respiratory: Lungs are clear to auscultation. Skin:  Without evidence of edema, or [name] Trunk: BMI is morbidly obese- super-obese with orthopnea.  . The patient's posture is not erect.    Neurologic exam : The patient is awake and alert, oriented to place and time.   Memory subjective described as intact.  Attention span & concentration ability appears normal.  Speech is fluent,  without dysarthria, dysphonia or aphasia.  Mood and affect are appropriate.  The patient was advised of the nature of the diagnosed sleep disorder , the treatment options and risks for general a health and wellness arising from not treating the condition.  I spent more than 40 minutes of face to face time with the patient. Greater than 50% of time was spent in counseling and coordination of care. We have discussed the diagnosis and differential and I answered the patient's questions.     Assessment:  After physical and neurologic examination, review of  laboratory studies,  Personal review of imaging studies, reports of other /same  Imaging studies ,  Results of polysomnography/ neurophysiology testing and pre-existing records as far as provided in visit., my assessment is  The sleep study does not longer reveal and need for CPAP use given that her apnea index was very low. However the patient prefers to use it and that is up to her discretion. I strongly recommend an elevated head of bed to contact the orthopnea, a wedge in the bed or an adjustable head of bed would be recommended. A medical bed could also be considered. Her BMI is a her risk factor.   Plan:  Treatment plan and additional workup : Sleep hygiene. Discussed need for routines.  She needs to put the TV on a timer, if she likes back ground noise use a radio or audio book.  Please remember to try to maintain good sleep hygiene, which means: Keep a regular sleep and wake schedule, try not to exercise or have a meal within 2 hours of your bedtime, try to keep your bedroom conducive for sleep, that is, cool and dark, without light distractors such as an illuminated alarm clock, and refrain from watching TV right before sleep or in the middle of the night and do not keep the TV or radio on during the night. Also, try not to use or play on electronic devices at bedtime, such as your cell phone, tablet PC or laptop. If you like to read at bedtime on an electronic device, try to dim the background light as much as possible. Do not eat in the middle of the night.   For chronic insomnia, you are best followed by a psychiatrist and/or sleep psychologist.   You no longer have apnea- and will not need to follow up in the sleep clinic.     Asencion Partridge Jahseh Lucchese MD  07/31/2015   CC: Octavio Graves, [address], [place] [address]

## 2015-08-07 ENCOUNTER — Ambulatory Visit (INDEPENDENT_AMBULATORY_CARE_PROVIDER_SITE_OTHER): Payer: Medicare Other | Admitting: *Deleted

## 2015-08-07 DIAGNOSIS — I4891 Unspecified atrial fibrillation: Secondary | ICD-10-CM

## 2015-08-07 DIAGNOSIS — Z5181 Encounter for therapeutic drug level monitoring: Secondary | ICD-10-CM | POA: Diagnosis not present

## 2015-08-07 LAB — POCT INR: INR: 2.9

## 2015-08-17 ENCOUNTER — Inpatient Hospital Stay (HOSPITAL_COMMUNITY)
Admission: EM | Admit: 2015-08-17 | Discharge: 2015-08-20 | DRG: 872 | Disposition: A | Discharge status: Home / Self Care | Payer: Medicare Other | Attending: Internal Medicine | Admitting: Internal Medicine

## 2015-08-17 ENCOUNTER — Emergency Department (HOSPITAL_COMMUNITY): Payer: Medicare Other

## 2015-08-17 ENCOUNTER — Encounter (HOSPITAL_COMMUNITY): Payer: Self-pay | Admitting: *Deleted

## 2015-08-17 DIAGNOSIS — R531 Weakness: Secondary | ICD-10-CM

## 2015-08-17 DIAGNOSIS — Z823 Family history of stroke: Secondary | ICD-10-CM | POA: Diagnosis not present

## 2015-08-17 DIAGNOSIS — A419 Sepsis, unspecified organism: Principal | ICD-10-CM | POA: Diagnosis present

## 2015-08-17 DIAGNOSIS — B379 Candidiasis, unspecified: Secondary | ICD-10-CM | POA: Diagnosis present

## 2015-08-17 DIAGNOSIS — Z7901 Long term (current) use of anticoagulants: Secondary | ICD-10-CM

## 2015-08-17 DIAGNOSIS — I4891 Unspecified atrial fibrillation: Secondary | ICD-10-CM | POA: Diagnosis not present

## 2015-08-17 DIAGNOSIS — K219 Gastro-esophageal reflux disease without esophagitis: Secondary | ICD-10-CM | POA: Diagnosis present

## 2015-08-17 DIAGNOSIS — E785 Hyperlipidemia, unspecified: Secondary | ICD-10-CM | POA: Diagnosis present

## 2015-08-17 DIAGNOSIS — I890 Lymphedema, not elsewhere classified: Secondary | ICD-10-CM

## 2015-08-17 DIAGNOSIS — L0390 Cellulitis, unspecified: Secondary | ICD-10-CM | POA: Diagnosis present

## 2015-08-17 DIAGNOSIS — R509 Fever, unspecified: Secondary | ICD-10-CM

## 2015-08-17 DIAGNOSIS — I4821 Permanent atrial fibrillation: Secondary | ICD-10-CM | POA: Diagnosis present

## 2015-08-17 DIAGNOSIS — E669 Obesity, unspecified: Secondary | ICD-10-CM | POA: Diagnosis present

## 2015-08-17 DIAGNOSIS — Z6841 Body Mass Index (BMI) 40.0 and over, adult: Secondary | ICD-10-CM | POA: Diagnosis not present

## 2015-08-17 DIAGNOSIS — Z820 Family history of epilepsy and other diseases of the nervous system: Secondary | ICD-10-CM | POA: Diagnosis not present

## 2015-08-17 DIAGNOSIS — L03116 Cellulitis of left lower limb: Secondary | ICD-10-CM | POA: Diagnosis present

## 2015-08-17 DIAGNOSIS — L03032 Cellulitis of left toe: Secondary | ICD-10-CM | POA: Diagnosis not present

## 2015-08-17 DIAGNOSIS — D72829 Elevated white blood cell count, unspecified: Secondary | ICD-10-CM | POA: Diagnosis present

## 2015-08-17 DIAGNOSIS — I10 Essential (primary) hypertension: Secondary | ICD-10-CM | POA: Diagnosis not present

## 2015-08-17 DIAGNOSIS — Z950 Presence of cardiac pacemaker: Secondary | ICD-10-CM | POA: Diagnosis present

## 2015-08-17 DIAGNOSIS — E039 Hypothyroidism, unspecified: Secondary | ICD-10-CM | POA: Diagnosis present

## 2015-08-17 DIAGNOSIS — L03031 Cellulitis of right toe: Secondary | ICD-10-CM | POA: Diagnosis not present

## 2015-08-17 DIAGNOSIS — I482 Chronic atrial fibrillation: Secondary | ICD-10-CM | POA: Diagnosis not present

## 2015-08-17 DIAGNOSIS — Z87891 Personal history of nicotine dependence: Secondary | ICD-10-CM

## 2015-08-17 LAB — COMPREHENSIVE METABOLIC PANEL
ALBUMIN: 3.6 g/dL (ref 3.5–5.0)
ALT: 21 U/L (ref 14–54)
ANION GAP: 10 (ref 5–15)
AST: 17 U/L (ref 15–41)
Alkaline Phosphatase: 112 U/L (ref 38–126)
BILIRUBIN TOTAL: 2 mg/dL — AB (ref 0.3–1.2)
BUN: 22 mg/dL — ABNORMAL HIGH (ref 6–20)
CO2: 22 mmol/L (ref 22–32)
Calcium: 8.7 mg/dL — ABNORMAL LOW (ref 8.9–10.3)
Chloride: 100 mmol/L — ABNORMAL LOW (ref 101–111)
Creatinine, Ser: 0.92 mg/dL (ref 0.44–1.00)
GFR calc Af Amer: >60 mL/min (ref >60)
GLUCOSE: 172 mg/dL — AB (ref 65–99)
POTASSIUM: 4.2 mmol/L (ref 3.5–5.1)
Sodium: 132 mmol/L — ABNORMAL LOW (ref 135–145)
TOTAL PROTEIN: 7.4 g/dL (ref 6.5–8.1)

## 2015-08-17 LAB — GLUCOSE, CAPILLARY
GLUCOSE-CAPILLARY: 103 mg/dL — AB (ref 65–99)
GLUCOSE-CAPILLARY: 122 mg/dL — AB (ref 65–99)
Glucose-Capillary: 125 mg/dL — ABNORMAL HIGH (ref 65–99)

## 2015-08-17 LAB — URINE MICROSCOPIC-ADD ON

## 2015-08-17 LAB — LACTIC ACID, PLASMA
LACTIC ACID, VENOUS: 2.1 mmol/L — AB (ref 0.5–2.0)
LACTIC ACID, VENOUS: 2.5 mmol/L — AB (ref 0.5–2.0)

## 2015-08-17 LAB — CBC WITH DIFFERENTIAL/PLATELET
BASOS ABS: 0 10*3/uL (ref 0.0–0.1)
BASOS PCT: 0 %
EOS ABS: 0 10*3/uL (ref 0.0–0.7)
EOS PCT: 0 %
HCT: 41.4 % (ref 36.0–46.0)
Hemoglobin: 13.8 g/dL (ref 12.0–15.0)
Lymphocytes Relative: 4 %
Lymphs Abs: 1 10*3/uL (ref 0.7–4.0)
MCH: 31.9 pg (ref 26.0–34.0)
MCHC: 33.3 g/dL (ref 30.0–36.0)
MCV: 95.8 fL (ref 78.0–100.0)
MONO ABS: 1.5 10*3/uL — AB (ref 0.1–1.0)
Monocytes Relative: 5 %
Neutro Abs: 25 10*3/uL — ABNORMAL HIGH (ref 1.7–7.7)
Neutrophils Relative %: 91 %
PLATELETS: 248 10*3/uL (ref 150–400)
RBC: 4.32 MIL/uL (ref 3.87–5.11)
RDW: 14.3 % (ref 11.5–15.5)
WBC: 27.5 10*3/uL — ABNORMAL HIGH (ref 4.0–10.5)

## 2015-08-17 LAB — URINALYSIS, ROUTINE W REFLEX MICROSCOPIC
Bilirubin Urine: NEGATIVE
Glucose, UA: NEGATIVE mg/dL
Ketones, ur: NEGATIVE mg/dL
Leukocytes, UA: NEGATIVE
Nitrite: NEGATIVE
Protein, ur: NEGATIVE mg/dL
SPECIFIC GRAVITY, URINE: 1.025 (ref 1.005–1.030)
pH: 5.5 (ref 5.0–8.0)

## 2015-08-17 LAB — INFLUENZA PANEL BY PCR (TYPE A & B)
H1N1FLUPCR: NOT DETECTED
Influenza A By PCR: NEGATIVE
Influenza B By PCR: NEGATIVE

## 2015-08-17 LAB — PROTIME-INR
INR: 2.32 — AB (ref 0.00–1.49)
Prothrombin Time: 25.2 seconds — ABNORMAL HIGH (ref 11.6–15.2)

## 2015-08-17 LAB — TROPONIN I: Troponin I: <0.03 ng/mL (ref <0.031)

## 2015-08-17 LAB — LIPASE, BLOOD: LIPASE: 15 U/L (ref 11–51)

## 2015-08-17 MED ORDER — FLUCONAZOLE 100 MG PO TABS
150.0000 mg | ORAL_TABLET | Freq: Every day | ORAL | Status: DC
  Filled 2015-08-17 (×4): qty 2

## 2015-08-17 MED ORDER — ONDANSETRON HCL 4 MG/2ML IJ SOLN: 4.0000 mg | Freq: Four times a day (QID) | INTRAMUSCULAR | Status: DC | PRN

## 2015-08-17 MED ORDER — SODIUM CHLORIDE 0.9% FLUSH: 3.0000 mL | INTRAVENOUS | Status: DC | PRN

## 2015-08-17 MED ORDER — CLOTRIMAZOLE-BETAMETHASONE 1-0.05 % EX CREA
1.0000 "application " | TOPICAL_CREAM | Freq: Every day | CUTANEOUS | Status: DC | PRN
  Filled 2015-08-17: qty 15

## 2015-08-17 MED ORDER — ACETAMINOPHEN 650 MG RE SUPP: 650.0000 mg | Freq: Four times a day (QID) | RECTAL | Status: DC | PRN

## 2015-08-17 MED ORDER — PIPERACILLIN-TAZOBACTAM 3.375 G IVPB 30 MIN: 3.3750 g | Freq: Once | INTRAVENOUS | Status: DC

## 2015-08-17 MED ORDER — DILTIAZEM HCL ER COATED BEADS 240 MG PO CP24
240.0000 mg | ORAL_CAPSULE | Freq: Every day | ORAL | Status: DC
  Administered 2015-08-17 – 2015-08-18 (×2): 240 mg via ORAL
  Filled 2015-08-17 (×4): qty 1

## 2015-08-17 MED ORDER — SODIUM CHLORIDE 0.9 % IV SOLN: INTRAVENOUS | Status: AC

## 2015-08-17 MED ORDER — ONDANSETRON HCL 4 MG/2ML IJ SOLN: 4.0000 mg | Freq: Three times a day (TID) | INTRAMUSCULAR | Status: AC | PRN

## 2015-08-17 MED ORDER — SODIUM CHLORIDE 0.9 % IV SOLN: 250.0000 mL | INTRAVENOUS | Status: DC | PRN

## 2015-08-17 MED ORDER — CLINDAMYCIN PHOSPHATE 600 MG/50ML IV SOLN
600.0000 mg | Freq: Three times a day (TID) | INTRAVENOUS | Status: DC
  Administered 2015-08-17 – 2015-08-18 (×3): 600 mg via INTRAVENOUS
  Filled 2015-08-17 (×7): qty 50

## 2015-08-17 MED ORDER — VANCOMYCIN HCL IN DEXTROSE 1-5 GM/200ML-% IV SOLN: 1000.0000 mg | Freq: Once | INTRAVENOUS | Status: DC

## 2015-08-17 MED ORDER — SODIUM CHLORIDE 0.9 % IV SOLN
INTRAVENOUS | Status: DC
  Administered 2015-08-17: 05:00:00 via INTRAVENOUS

## 2015-08-17 MED ORDER — CLINDAMYCIN PHOSPHATE 600 MG/50ML IV SOLN
600.0000 mg | Freq: Once | INTRAVENOUS | Status: DC
  Filled 2015-08-17: qty 50

## 2015-08-17 MED ORDER — METOPROLOL TARTRATE 50 MG PO TABS
50.0000 mg | ORAL_TABLET | Freq: Two times a day (BID) | ORAL | Status: DC
  Administered 2015-08-17 – 2015-08-20 (×7): 50 mg via ORAL
  Filled 2015-08-17 (×7): qty 1

## 2015-08-17 MED ORDER — SODIUM CHLORIDE 0.9% FLUSH
3.0000 mL | Freq: Two times a day (BID) | INTRAVENOUS | Status: DC
  Administered 2015-08-17 – 2015-08-20 (×4): 3 mL via INTRAVENOUS

## 2015-08-17 MED ORDER — ACETAMINOPHEN 325 MG PO TABS
650.0000 mg | ORAL_TABLET | Freq: Four times a day (QID) | ORAL | Status: DC | PRN
  Administered 2015-08-17: 650 mg via ORAL
  Filled 2015-08-17: qty 2

## 2015-08-17 MED ORDER — ONDANSETRON HCL 4 MG/2ML IJ SOLN
4.0000 mg | INTRAMUSCULAR | Status: DC | PRN
  Administered 2015-08-17: 4 mg via INTRAVENOUS
  Filled 2015-08-17: qty 2

## 2015-08-17 MED ORDER — ONDANSETRON HCL 4 MG PO TABS: 4.0000 mg | ORAL_TABLET | Freq: Four times a day (QID) | ORAL | Status: DC | PRN

## 2015-08-17 NOTE — ED Notes (Signed)
Pt c/o fever, generalized bodyaches, nausea that started during the night

## 2015-08-17 NOTE — ED Provider Notes (Signed)
CSN: [id number]     Arrival date & time 08/17/15  0254 History   First MD Initiated Contact with Patient 08/17/15 0302     Chief Complaint  Patient presents with  . Fever      HPI Pt was seen at 0310. Per pt, c/o gradual onset and persistence of constant generalized weakness/fatigue that began several hours ago. Pt states she had a fever of "101" at home. Has been associated with nausea and generalized body aches. Denies CP/palpitations, no SOB/cough, no abd pain, no vomiting/diarrhea, no back pain, no focal motor weakness, no tingling/numbness in extremities, no rash, no headache, no neck pain.    Past Medical History  Diagnosis Date  . Hypertension   . Hyperlipidemia   . GERD (gastroesophageal reflux disease)   . Persistent atrial fibrillation (HCC)      post-termination pauses with afib s/p PPM  . Degenerative joint disease   . Acute diastolic heart failure ([place])   . Sinoatrial node dysfunction (HCC)     s/p PPM  . Congestive heart failure, unspecified   . Unspecified hypothyroidism   . Anxiety state, unspecified   . Unspecified venous (peripheral) insufficiency   . Obesity   . Sleep apnea     compliant with CPAP  . Presence of permanent cardiac pacemaker   . Lymphedema    Past Surgical History  Procedure Laterality Date  . Pacemaker insertion      SJM by JA  . Cataract extraction w/phaco Left 09/09/2014    Procedure: CATARACT EXTRACTION PHACO AND INTRAOCULAR LENS PLACEMENT (IOC);  Surgeon: Tonny Branch, MD;  Location: [facility];  Service: Ophthalmology;  Laterality: Left;  CDE: 6.99  . Cataract extraction w/phaco Right 10/07/2014    Procedure: CATARACT EXTRACTION PHACO AND INTRAOCULAR LENS PLACEMENT RIGHT EYE;  Surgeon: Tonny Branch, MD;  Location: [facility];  Service: Ophthalmology;  Laterality: Right;  CDE:5.16   Family History  Problem Relation Age of Onset  . Parkinson's disease    . CVA    . Parkinson's disease Father    Social History  Substance Use Topics  . Smoking  status: Former Smoker -- 0.50 packs/day for 10 years    Types: Cigarettes    Quit date: 03/29/1978  . Smokeless tobacco: Never Used  . Alcohol Use: No    Review of Systems ROS: Statement: All systems negative except as marked or noted in the HPI; Constitutional: +fever and chills, generalized body aches/generalized weakness/fatigue.. ; ; Eyes: Negative for eye pain, redness and discharge. ; ; ENMT: Negative for ear pain, hoarseness, nasal congestion, sinus pressure and sore throat. ; ; Cardiovascular: Negative for chest pain, palpitations, diaphoresis, dyspnea and peripheral edema. ; ; Respiratory: Negative for cough, wheezing and stridor. ; ; Gastrointestinal: +nausea. Negative for vomiting, diarrhea, abdominal pain, blood in stool, hematemesis, jaundice and rectal bleeding. . ; ; Genitourinary: Negative for dysuria, flank pain and hematuria. ; ; Musculoskeletal: Negative for back pain and neck pain. Negative for swelling and trauma.; ; Skin: Negative for pruritus, rash, abrasions, blisters, bruising and skin lesion.; ; Neuro: Negative for headache, lightheadedness and neck stiffness. Negative for altered level of consciousness, altered mental status, extremity weakness, paresthesias, involuntary movement, seizure and syncope.      Allergies  Review of patient's allergies indicates no known allergies.  Home Medications   Prior to Admission medications   Medication Sig Start Date End Date Taking? Authorizing Provider  acetaminophen (TYLENOL) 500 MG tablet Take 500 mg by mouth every 6 (six) hours  as needed for mild pain or moderate pain.   Yes Historical Provider, MD  ALPRAZolam Duanne Moron) 0.25 MG tablet Take 0.25 mg by mouth at bedtime as needed for anxiety or sleep.    Yes Historical Provider, MD  atorvastatin (LIPITOR) 40 MG tablet Take 40 mg by mouth at bedtime.    Yes Historical Provider, MD  cholecalciferol (VITAMIN D) 1000 UNITS tablet Take 50,000 Units by mouth once a week. 50,000 weekly    Yes Historical Provider, MD  clotrimazole-betamethasone (LOTRISONE) cream Apply 1 application topically daily as needed (for affected area(S)).   Yes Historical Provider, MD  diltiazem (CARDIZEM CD) 240 MG 24 hr capsule Take 240 mg by mouth daily.   Yes Historical Provider, MD  esomeprazole (NEXIUM) 40 MG capsule Take 40 mg by mouth as needed (acid reflux).    Yes Historical Provider, MD  levothyroxine (SYNTHROID, LEVOTHROID) 150 MCG tablet Take 150 mcg by mouth every morning.    Yes Historical Provider, MD  metoprolol (LOPRESSOR) 100 MG tablet TAKE ONE-HALF TABLET BY MOUTH TWICE DAILY 07/01/15  Yes Thompson Grayer, MD  warfarin (COUMADIN) 3 MG tablet Take 1 1/2 tablets daily except 1 tablet on Mondays, Wednesdays and Fridays 07/21/15  Yes Thompson Grayer, MD  furosemide (LASIX) 20 MG tablet Take 20 mg by mouth as needed for fluid.     Historical Provider, MD   BP 130/49 mmHg  Pulse 111  Temp(Src) 99.2 F (37.3 C) (Oral)  Resp 20  Ht 5' (1.524 m)  Wt 280 lb (127.007 kg)  BMI 54.68 kg/m2  SpO2 95%   03:51 Orthostatic Vital Signs AD  Orthostatic Lying  - BP- Lying: 149/59 mmHg ; Pulse- Lying: 99  Orthostatic Sitting - BP- Sitting: 148/73 mmHg ; Pulse- Sitting: 99  Orthostatic Standing at 0 minutes - BP- Standing at 0 minutes: 152/74 mmHg ; Pulse- Standing at 0 minutes: 108     Patient Vitals for the past 24 hrs:  BP Temp Temp src Pulse Resp SpO2 Height Weight  08/17/15 0500 119/72 mmHg - - 90 15 95 % - -  08/17/15 0413 - 100.4 F (38 C) Rectal - - - - -  08/17/15 0308 (!) 130/49 mmHg 99.2 F (37.3 C) Oral 111 20 95 % 5' (1.524 m) 280 lb (127.007 kg)     Physical Exam  0315: Physical examination:  Nursing notes reviewed; Vital signs and O2 SAT reviewed;  Constitutional: Well developed, Well nourished, In no acute distress; Head:  Normocephalic, atraumatic; Eyes: EOMI, PERRL, No scleral icterus; ENMT: Mouth and pharynx normal, Mucous membranes dry; Neck: Supple, Full range of motion, No  lymphadenopathy; Cardiovascular: Irregular irregular rate and rhythm, No gallop; Respiratory: Breath sounds clear & equal bilaterally, No wheezes.  Speaking full sentences with ease, Normal respiratory effort/excursion; Chest: Nontender, Movement normal; Abdomen: Soft, Nontender, Nondistended, Normal bowel sounds; Genitourinary: No CVA tenderness; Extremities: Pulses normal, No tenderness, +2-3 pedal edema bilat. No calf asymmetry. +erythema to anterior LLE ankle to knee.; Neuro: AA&Ox3, Major CN grossly intact. No facial droop. Speech clear. Grips equal. Moves all extremities spontaneously and to command without apparent gross focal motor deficits.; Skin: Color normal, Warm, Dry.   ED Course  Procedures (including critical care time) Labs Review   Imaging Review  I have personally reviewed and evaluated these images and lab results as part of my medical decision-making.   EKG Interpretation   Date/Time:  Sunday Aug 17 2015 03:35:59 EDT Ventricular Rate:  94 PR Interval:    QRS Duration:  83 QT Interval:  317 QTC Calculation: 396 R Axis:   46 Text Interpretation:  Atrial fibrillation Ventricular premature complex  Borderline low voltage, extremity leads Minimal ST depression, diffuse  leads Baseline wander When compared with ECG of 09/04/2014 Nonspecific ST  and T wave abnormality is now Present Confirmed by [facility]  MD, Nunzio Cory  [phone]) on 08/17/2015 3:53:15 AM      MDM  MDM Reviewed: previous chart, nursing note and vitals Reviewed previous: labs and ECG Interpretation: labs, ECG and x-ray     Results for orders placed or performed during the hospital encounter of 08/17/15  Urinalysis, Routine w reflex microscopic  Result Value Ref Range   Color, Urine YELLOW YELLOW   APPearance CLEAR CLEAR   Specific Gravity, Urine 1.025 1.005 - 1.030   pH 5.5 5.0 - 8.0   Glucose, UA NEGATIVE NEGATIVE mg/dL   Hgb urine dipstick TRACE (A) NEGATIVE   Bilirubin Urine NEGATIVE NEGATIVE    Ketones, ur NEGATIVE NEGATIVE mg/dL   Protein, ur NEGATIVE NEGATIVE mg/dL   Nitrite NEGATIVE NEGATIVE   Leukocytes, UA NEGATIVE NEGATIVE  Comprehensive metabolic panel  Result Value Ref Range   Sodium 132 (L) 135 - 145 mmol/L   Potassium 4.2 3.5 - 5.1 mmol/L   Chloride 100 (L) 101 - 111 mmol/L   CO2 22 22 - 32 mmol/L   Glucose, Bld 172 (H) 65 - 99 mg/dL   BUN 22 (H) 6 - 20 mg/dL   Creatinine, Ser 0.92 0.44 - 1.00 mg/dL   Calcium 8.7 (L) 8.9 - 10.3 mg/dL   Total Protein 7.4 6.5 - 8.1 g/dL   Albumin 3.6 3.5 - 5.0 g/dL   AST 17 15 - 41 U/L   ALT 21 14 - 54 U/L   Alkaline Phosphatase 112 38 - 126 U/L   Total Bilirubin 2.0 (H) 0.3 - 1.2 mg/dL   GFR calc non Af Amer >60 >60 mL/min   GFR calc Af Amer >60 >60 mL/min   Anion gap 10 5 - 15  Lipase, blood  Result Value Ref Range   Lipase 15 11 - 51 U/L  Troponin I  Result Value Ref Range   Troponin I <0.03 <0.031 ng/mL  Lactic acid, plasma  Result Value Ref Range   Lactic Acid, Venous 2.1 (HH) 0.5 - 2.0 mmol/L  CBC with Differential  Result Value Ref Range   WBC 27.5 (H) 4.0 - 10.5 K/uL   RBC 4.32 3.87 - 5.11 MIL/uL   Hemoglobin 13.8 12.0 - 15.0 g/dL   HCT 41.4 36.0 - 46.0 %   MCV 95.8 78.0 - 100.0 fL   MCH 31.9 26.0 - 34.0 pg   MCHC 33.3 30.0 - 36.0 g/dL   RDW 14.3 11.5 - 15.5 %   Platelets 248 150 - 400 K/uL   Neutrophils Relative % 91 %   Neutro Abs 25.0 (H) 1.7 - 7.7 K/uL   Lymphocytes Relative 4 %   Lymphs Abs 1.0 0.7 - 4.0 K/uL   Monocytes Relative 5 %   Monocytes Absolute 1.5 (H) 0.1 - 1.0 K/uL   Eosinophils Relative 0 %   Eosinophils Absolute 0.0 0.0 - 0.7 K/uL   Basophils Relative 0 %   Basophils Absolute 0.0 0.0 - 0.1 K/uL  Protime-INR  Result Value Ref Range   Prothrombin Time 25.2 (H) 11.6 - 15.2 seconds   INR 2.32 (H) 0.00 - 1.49  Urine microscopic-add on  Result Value Ref Range   Squamous Epithelial / LPF 0-5 (  A) NONE SEEN   WBC, UA 0-5 0 - 5 WBC/hpf   RBC / HPF 0-5 0 - 5 RBC/hpf   Bacteria, UA FEW (A)  NONE SEEN   Urine-Other MUCOUS PRESENT    Dg Chest 2 View 08/17/2015  CLINICAL DATA:  Fever, weakness, body aches, and nausea starting during the night. EXAM: CHEST  2 VIEW COMPARISON:  10/24/2008 FINDINGS: Cardiac pacemaker. Normal heart size and pulmonary vascularity. No focal airspace disease or consolidation in the lungs. No blunting of costophrenic angles. No pneumothorax. Mediastinal contours appear intact. Calcification of the aorta. IMPRESSION: No active cardiopulmonary disease. Electronically Signed   By: Lucienne Capers M.D.   On: 08/17/2015 05:11    [id number]::  IVF given for mildly elevated lactic acid, BC and UC obtained. Abd remains benign, resps easy, VSS. Not orthostatic on VS, but needed much assist to stand (not baseline per pt and family). Dx and testing d/w pt and family.  Questions answered.  Verb understanding, agreeable to admit. T/C to [facility] Dr. Shanon Brow, case discussed, including:  HPI, pertinent PM/SHx, VS/PE, dx testing, ED course and treatment:  Agreeable to admit, requests not to start abx at this time, write temporary orders, obtain observation medical bed to team [facility].  [id number]:  Pt initially told me her LLE is "always like that" when I and 2 RN's asked her regarding the redness to her left tibial area. Pt now stating, "oh, maybe that's new then" over the past few days. Will dose IV clindamycin; [facility] MD aware.      Francine Graven, DO 08/19/15 1831

## 2015-08-17 NOTE — ED Notes (Signed)
CRITICAL VALUE ALERT  Critical value received:  Lactic acid 2.1  Date of notification:  08/17/2015  Time of notification:  04:08  Critical value read back: yes Nurse who received alert:  Rip Harbour RN   MD notified (1st page):  Dr Thurnell Garbe  Time of first page:  04:08  MD notified (2nd page):  Time of second page:  Responding MD:  Dr Thurnell Garbe  Time MD responded:  04:08

## 2015-08-17 NOTE — ED Notes (Signed)
Patient refused to take Diflucan, " I have taken that before and it doesn't help".

## 2015-08-17 NOTE — H&P (Signed)
PCP:   CYNTHIA BUTLER, DO   Chief Complaint:  Fever,chills, nausea, weakness  HPI: 70 yo female h/o afib, chf, OSA on cpap qhs, chronic lymphedema to her ble comes in with not feeling well that started about 6pm last night with fever of 101.  Nausea with no vomiting and feeling weak.  Pt has been in normal state of health.  She denies any sick contacts.  No uri symptoms, no dysuria or urinary symptoms.  No cough.  No chest pain or abdominal pain.  No pain anywhere.  No swelling in her legs.  No rashes that she knows of, however she cannot see her legs on a normal day due to her obesity.  No diarrhea.  On arrival pt with a temp of 100.4, vitals stable.  Wbc elevated.  Pt referred for admission for leukocytosis and fever without a source, ua and cxr are normal.    Of note, pt left leg is red.  Pt was unaware of this leg being red and says it is not painful.  Once the leg was lifted for her and she could view it she reported that her leg is not normally this red at all, but sometimes her lymphedema "flares up" and she needs to be put on antibiotics for it.  She has not been on abx for over 5 months.  Her lymphedema is actually mild at this time and she doesn't have a significant amount of swelling, but her left leg is red from the ankle up to the mid thigh.  Review of Systems:  Positive and negative as per HPI otherwise all other systems are negative  Past Medical History: Past Medical History  Diagnosis Date  . Hypertension   . Hyperlipidemia   . GERD (gastroesophageal reflux disease)   . Persistent atrial fibrillation (HCC)      post-termination pauses with afib s/p PPM  . Degenerative joint disease   . Acute diastolic heart failure ([place])   . Sinoatrial node dysfunction (HCC)     s/p PPM  . Congestive heart failure, unspecified   . Unspecified hypothyroidism   . Anxiety state, unspecified   . Unspecified venous (peripheral) insufficiency   . Obesity   . Sleep apnea     compliant with  CPAP  . Presence of permanent cardiac pacemaker   . Lymphedema    Past Surgical History  Procedure Laterality Date  . Pacemaker insertion      SJM by JA  . Cataract extraction w/phaco Left 09/09/2014    Procedure: CATARACT EXTRACTION PHACO AND INTRAOCULAR LENS PLACEMENT (IOC);  Surgeon: Tonny Branch, MD;  Location: [facility];  Service: Ophthalmology;  Laterality: Left;  CDE: 6.99  . Cataract extraction w/phaco Right 10/07/2014    Procedure: CATARACT EXTRACTION PHACO AND INTRAOCULAR LENS PLACEMENT RIGHT EYE;  Surgeon: Tonny Branch, MD;  Location: [facility];  Service: Ophthalmology;  Laterality: Right;  CDE:5.16    Medications: Prior to Admission medications   Medication Sig Start Date End Date Taking? Authorizing Provider  acetaminophen (TYLENOL) 500 MG tablet Take 500 mg by mouth every 6 (six) hours as needed for mild pain or moderate pain.   Yes Historical Provider, MD  ALPRAZolam Duanne Moron) 0.25 MG tablet Take 0.25 mg by mouth at bedtime as needed for anxiety or sleep.    Yes Historical Provider, MD  atorvastatin (LIPITOR) 40 MG tablet Take 40 mg by mouth at bedtime.    Yes Historical Provider, MD  cholecalciferol (VITAMIN D) 1000 UNITS tablet Take 50,000 Units  by mouth once a week. 50,000 weekly   Yes Historical Provider, MD  clotrimazole-betamethasone (LOTRISONE) cream Apply 1 application topically daily as needed (for affected area(S)).   Yes Historical Provider, MD  diltiazem (CARDIZEM CD) 240 MG 24 hr capsule Take 240 mg by mouth daily.   Yes Historical Provider, MD  esomeprazole (NEXIUM) 40 MG capsule Take 40 mg by mouth as needed (acid reflux).    Yes Historical Provider, MD  levothyroxine (SYNTHROID, LEVOTHROID) 150 MCG tablet Take 150 mcg by mouth every morning.    Yes Historical Provider, MD  metoprolol (LOPRESSOR) 100 MG tablet TAKE ONE-HALF TABLET BY MOUTH TWICE DAILY 07/01/15  Yes Thompson Grayer, MD  warfarin (COUMADIN) 3 MG tablet Take 1 1/2 tablets daily except 1 tablet on Mondays, Wednesdays  and Fridays 07/21/15  Yes Thompson Grayer, MD  furosemide (LASIX) 20 MG tablet Take 20 mg by mouth as needed for fluid.     Historical Provider, MD    Allergies:  No Known Allergies  Social History:  reports that she quit smoking about 37 years ago. Her smoking use included Cigarettes. She has a 5 pack-year smoking history. She has never used smokeless tobacco. She reports that she does not drink alcohol or use illicit drugs.  Family History: Family History  Problem Relation Age of Onset  . Parkinson's disease    . CVA    . Parkinson's disease Father     Physical Exam: Filed Vitals:   08/17/15 0308 08/17/15 0413 08/17/15 0500  BP: 130/49  119/72  Pulse: 111  90  Temp: 99.2 F (37.3 C) 100.4 F (38 C)   TempSrc: Oral Rectal   Resp: 20  15  Height: 5' (1.524 m)    Weight: 127.007 kg (280 lb)    SpO2: 95%  95%   General appearance: alert, cooperative and no distress Head: Normocephalic, without obvious abnormality, atraumatic Eyes: negative Nose: Nares normal. Septum midline. Mucosa normal. No drainage or sinus tenderness. Neck: no JVD and supple, symmetrical, trachea midline Lungs: clear to auscultation bilaterally Heart: regular rate and rhythm, S1, S2 normal, no murmur, click, rub or gallop Abdomen: soft, non-tender; bowel sounds normal; no masses,  no organomegaly Extremities: extremities normal, atraumatic, no cyanosis or edema Pulses: 2+ and symmetric Skin: Skin color, texture, turgor normal. No rashes or lesions except erythematous rash to her left leg from ankle up to mid thigh with clear demarcation mid thigh c/w developing cellulitis with not a lot of swelling/edema associated with it yet.  She also has under her pannus a very red raw rash c/w yeast Neurologic: Grossly normal    Labs on Admission:   Recent Labs  08/17/15 0310  NA 132*  K 4.2  CL 100*  CO2 22  GLUCOSE 172*  BUN 22*  CREATININE 0.92  CALCIUM 8.7*    Recent Labs  08/17/15 0310  AST 17   ALT 21  ALKPHOS 112  BILITOT 2.0*  PROT 7.4  ALBUMIN 3.6    Recent Labs  08/17/15 0310  LIPASE 15    Recent Labs  08/17/15 0310  WBC 27.5*  NEUTROABS 25.0*  HGB 13.8  HCT 41.4  MCV 95.8  PLT 248    Recent Labs  08/17/15 0310  TROPONINI <0.03   Radiological Exams on Admission: Dg Chest 2 View  08/17/2015  CLINICAL DATA:  Fever, weakness, body aches, and nausea starting during the night. EXAM: CHEST  2 VIEW COMPARISON:  10/24/2008 FINDINGS: Cardiac pacemaker. Normal heart size and pulmonary  vascularity. No focal airspace disease or consolidation in the lungs. No blunting of costophrenic angles. No pneumothorax. Mediastinal contours appear intact. Calcification of the aorta. IMPRESSION: No active cardiopulmonary disease. Electronically Signed   By: Lucienne Capers M.D.   On: 08/17/2015 05:11    Assessment/Plan  70 yo female with fever, chills likely from developing cellulitis of her lle  Principal Problem:   Cellulitis-  Place on iv clindamycin.  Blood cx have been obtained.  Vitals all normal.  Lactic acid level 2.1.  Pt not septic.  Active Problems:   ATRIAL FIBRILLATION- rate controlled, continue on coumadin   PPM-St.Jude- noted   Long term (current) use of anticoagulants- cont coumadin   Weakness generalized- due to infection, nothing focal.  PT eval prior to discharge   Leukocytosis- due to cellulitis   Yeast infection under pannus - lotrisome cream.  Give a dose of diflucan.  obs on medical.  Full code.  Scarlett Portlock A 08/17/2015, 5:38 AM

## 2015-08-17 NOTE — Progress Notes (Signed)
Patient briefly seen and examined, database reviewed. Admitted earlier today for left lower extremity cellulitis. Extremity remains red and warm to touch. Plan to continue clindamycin. She was febrile to 100.4 today with a leukocytosis of 27.5.  Domingo Mend, MD [facility] Pager: [phone]

## 2015-08-18 LAB — BLOOD CULTURE ID PANEL (REFLEXED)
ACINETOBACTER BAUMANNII: NOT DETECTED
CARBAPENEM RESISTANCE: NOT DETECTED
Candida albicans: NOT DETECTED
Candida glabrata: NOT DETECTED
Candida krusei: NOT DETECTED
Candida parapsilosis: NOT DETECTED
Candida tropicalis: NOT DETECTED
ENTEROCOCCUS SPECIES: NOT DETECTED
Enterobacter cloacae complex: NOT DETECTED
Enterobacteriaceae species: NOT DETECTED
Escherichia coli: NOT DETECTED
HAEMOPHILUS INFLUENZAE: NOT DETECTED
Klebsiella oxytoca: NOT DETECTED
Klebsiella pneumoniae: NOT DETECTED
LISTERIA MONOCYTOGENES: NOT DETECTED
METHICILLIN RESISTANCE: NOT DETECTED
NEISSERIA MENINGITIDIS: NOT DETECTED
Proteus species: NOT DETECTED
Pseudomonas aeruginosa: NOT DETECTED
SERRATIA MARCESCENS: NOT DETECTED
STAPHYLOCOCCUS SPECIES: DETECTED — AB
STREPTOCOCCUS PYOGENES: NOT DETECTED
STREPTOCOCCUS SPECIES: NOT DETECTED
Staphylococcus aureus (BCID): NOT DETECTED
Streptococcus agalactiae: NOT DETECTED
Streptococcus pneumoniae: NOT DETECTED
VANCOMYCIN RESISTANCE: NOT DETECTED

## 2015-08-18 LAB — URINE CULTURE: Culture: NO GROWTH

## 2015-08-18 MED ORDER — VANCOMYCIN HCL 10 G IV SOLR
2000.0000 mg | Freq: Once | INTRAVENOUS | Status: AC
  Administered 2015-08-18: 2000 mg via INTRAVENOUS
  Filled 2015-08-18: qty 2000

## 2015-08-18 MED ORDER — VANCOMYCIN HCL IN DEXTROSE 1-5 GM/200ML-% IV SOLN
1000.0000 mg | Freq: Two times a day (BID) | INTRAVENOUS | Status: DC
  Administered 2015-08-19: 1000 mg via INTRAVENOUS
  Filled 2015-08-18: qty 200

## 2015-08-18 MED ORDER — WARFARIN SODIUM 2 MG PO TABS
3.0000 mg | ORAL_TABLET | Freq: Once | ORAL | Status: AC
  Administered 2015-08-18: 3 mg via ORAL
  Filled 2015-08-18: qty 1

## 2015-08-18 MED ORDER — LEVOTHYROXINE SODIUM 75 MCG PO TABS
150.0000 ug | ORAL_TABLET | Freq: Every morning | ORAL | Status: DC
  Administered 2015-08-19 – 2015-08-20 (×2): 150 ug via ORAL
  Filled 2015-08-18 (×2): qty 2

## 2015-08-18 MED ORDER — WARFARIN - PHARMACIST DOSING INPATIENT
Freq: Every day | Status: DC
  Administered 2015-08-18 – 2015-08-19 (×2): 1

## 2015-08-18 MED ORDER — PANTOPRAZOLE SODIUM 40 MG PO TBEC
40.0000 mg | DELAYED_RELEASE_TABLET | Freq: Every day | ORAL | Status: DC
  Administered 2015-08-18 – 2015-08-20 (×3): 40 mg via ORAL
  Filled 2015-08-18 (×2): qty 1

## 2015-08-18 MED ORDER — ATORVASTATIN CALCIUM 40 MG PO TABS
40.0000 mg | ORAL_TABLET | Freq: Every day | ORAL | Status: DC
  Administered 2015-08-18 – 2015-08-19 (×2): 40 mg via ORAL
  Filled 2015-08-18 (×2): qty 1

## 2015-08-18 NOTE — Progress Notes (Signed)
PROGRESS NOTE    Megan [name]  [id number] DOB: [birth year] DOA: 08/17/2015 PCP: Octavio Graves, DO     Brief Narrative:  70 year old woman admitted on 5/21 with left lower extremity cellulitis. Was placed on clindamycin. One out of 2 blood cultures has returned with gram-positive cocci in clusters.   Assessment & Plan:   Principal Problem:   Cellulitis Active Problems:   ATRIAL FIBRILLATION   PPM-St.Jude   Long term (current) use of anticoagulants   Weakness generalized   Leukocytosis   Cellulitis/gram-positive bacteremia -No indication of sepsis. -Antibiotics will be transitioned over to vancomycin pending results of blood culture. -Cellulitis appears improved today.  Leukocytosis -Secondary to cellulitis and bacteremia. -Recheck WBC count in a.m.   Atrial fibrillation -Rate controlled, continue Coumadin.     DVT prophylaxis: On Coumadin Code Status: Full code Family Communication: Patient only Disposition Plan: Despite discharged home in 48 hours approximately  Consultants:   None  Procedures:   None  Antimicrobials:   Vancomycin    Subjective: Feels improved, left leg is less tender  Objective: Filed Vitals:   08/17/15 1503 08/17/15 2155 08/18/15 0530 08/18/15 1330  BP: 120/66 110/63 136/58 91/40  Pulse: 83 90 94 70  Temp: 99.3 F (37.4 C) 98.2 F (36.8 C) 98.7 F (37.1 C) 98.2 F (36.8 C)  TempSrc: Oral Oral Oral   Resp: 18 18 18 20   Height:      Weight:      SpO2: 96% 97% 97% 97%    Intake/Output Summary (Last 24 hours) at 08/18/15 1616 Last data filed at 08/18/15 1000  Gross per 24 hour  Intake    203 ml  Output      0 ml  Net    203 ml   Filed Weights   08/17/15 0308  Weight: 127.007 kg (280 lb)    Examination:  General exam: Alert, awake, oriented x 3 Respiratory system: Clear to auscultation. Respiratory effort normal. Cardiovascular system:RRR. No murmurs, rubs, gallops. Gastrointestinal system: Abdomen is  nondistended, soft and nontender. No organomegaly or masses felt. Normal bowel sounds heard. Central nervous system: Alert and oriented. No focal neurological deficits. Extremities: Chronic bilateral lymphedema, left leg is erythematous and warm to touch from ankle all the way up to above the knee. Skin: No rashes, lesions or ulcers Psychiatry: Judgement and insight appear normal. Mood & affect appropriate.     Data Reviewed: I have personally reviewed following labs and imaging studies  CBC:  Recent Labs Lab 08/17/15 0310  WBC 27.5*  NEUTROABS 25.0*  HGB 13.8  HCT 41.4  MCV 95.8  PLT [id number]   Basic Metabolic Panel:  Recent Labs Lab 08/17/15 0310  NA 132*  K 4.2  CL 100*  CO2 22  GLUCOSE 172*  BUN 22*  CREATININE 0.92  CALCIUM 8.7*   GFR: Estimated Creatinine Clearance: 69.2 mL/min (by C-G formula based on Cr of 0.92). Liver Function Tests:  Recent Labs Lab 08/17/15 0310  AST 17  ALT 21  ALKPHOS 112  BILITOT 2.0*  PROT 7.4  ALBUMIN 3.6    Recent Labs Lab 08/17/15 0310  LIPASE 15   No results for input(s): AMMONIA in the last 168 hours. Coagulation Profile:  Recent Labs Lab 08/17/15 0335  INR 2.32*   Cardiac Enzymes:  Recent Labs Lab 08/17/15 0310  TROPONINI <0.03   BNP (last 3 results) No results for input(s): PROBNP in the last 8760 hours. HbA1C: No results for input(s): HGBA1C in the last 72  hours. CBG:  Recent Labs Lab 08/17/15 0801 08/17/15 1158 08/17/15 1651  GLUCAP 125* 103* 122*   Lipid Profile: No results for input(s): CHOL, HDL, LDLCALC, TRIG, CHOLHDL, LDLDIRECT in the last 72 hours. Thyroid Function Tests: No results for input(s): TSH, T4TOTAL, FREET4, T3FREE, THYROIDAB in the last 72 hours. Anemia Panel: No results for input(s): VITAMINB12, FOLATE, FERRITIN, TIBC, IRON, RETICCTPCT in the last 72 hours. Urine analysis:    Component Value Date/Time   COLORURINE YELLOW 08/17/2015 0416   APPEARANCEUR CLEAR 08/17/2015  0416   LABSPEC 1.025 08/17/2015 0416   PHURINE 5.5 08/17/2015 0416   GLUCOSEU NEGATIVE 08/17/2015 0416   HGBUR TRACE* 08/17/2015 0416   BILIRUBINUR NEGATIVE 08/17/2015 0416   KETONESUR NEGATIVE 08/17/2015 0416   PROTEINUR NEGATIVE 08/17/2015 0416   UROBILINOGEN 0.2 02/01/2014 1811   NITRITE NEGATIVE 08/17/2015 0416   LEUKOCYTESUR NEGATIVE 08/17/2015 0416   Sepsis Labs: @LABRCNTIP (procalcitonin:4,lacticidven:4)  ) Recent Results (from the past 240 hour(s))  Urine culture     Status: None   Collection Time: 08/17/15  4:16 AM  Result Value Ref Range Status   Specimen Description URINE, CATHETERIZED  Final   Special Requests NONE  Final   Culture NO GROWTH Performed at [facility]   Final   Report Status 08/18/2015 FINAL  Final  Culture, blood (routine x 2)     Status: None (Preliminary result)   Collection Time: 08/17/15  5:44 AM  Result Value Ref Range Status   Specimen Description BLOOD BLOOD RIGHT HAND  Final   Special Requests BOTTLES DRAWN AEROBIC AND ANAEROBIC 4CC  Final   Culture NO GROWTH 1 DAY  Final   Report Status PENDING  Incomplete  Culture, blood (routine x 2)     Status: None (Preliminary result)   Collection Time: 08/17/15  5:54 AM  Result Value Ref Range Status   Specimen Description BLOOD LEFT ANTECUBITAL  Final   Special Requests BOTTLES DRAWN AEROBIC AND ANAEROBIC 4CC  Final   Culture  Setup Time   Final    GRAM POSITIVE COCCI IN CLUSTERS RECOVERED FROM THE ANAEROBIC BOTTLE Gram Stain Report Called to,Read Back By and Verified With: COVINGTON,L. AT 1006 ON 08/18/2015 BY BAUGHAM,M. Performed at [facility] ID to follow Performed at [facility]    Culture PENDING  Incomplete   Report Status PENDING  Incomplete  Blood Culture ID Panel (Reflexed)     Status: Abnormal   Collection Time: 08/17/15  5:54 AM  Result Value Ref Range Status   Enterococcus species NOT DETECTED NOT DETECTED Final   Vancomycin resistance NOT  DETECTED NOT DETECTED Final   Listeria monocytogenes NOT DETECTED NOT DETECTED Final   Staphylococcus species DETECTED (A) NOT DETECTED Final    Comment: CRITICAL RESULT CALLED TO, READ BACK BY AND VERIFIED WITH: L. POOLE, PHARM D AT 1455 ON JB:[id number] BY S. YARBROUGH    Staphylococcus aureus NOT DETECTED NOT DETECTED Final   Methicillin resistance NOT DETECTED NOT DETECTED Final   Streptococcus species NOT DETECTED NOT DETECTED Final   Streptococcus agalactiae NOT DETECTED NOT DETECTED Final   Streptococcus pneumoniae NOT DETECTED NOT DETECTED Final   Streptococcus pyogenes NOT DETECTED NOT DETECTED Final   Acinetobacter baumannii NOT DETECTED NOT DETECTED Final   Enterobacteriaceae species NOT DETECTED NOT DETECTED Final   Enterobacter cloacae complex NOT DETECTED NOT DETECTED Final   Escherichia coli NOT DETECTED NOT DETECTED Final   Klebsiella oxytoca NOT DETECTED NOT DETECTED Final   Klebsiella pneumoniae NOT  DETECTED NOT DETECTED Final   Proteus species NOT DETECTED NOT DETECTED Final   Serratia marcescens NOT DETECTED NOT DETECTED Final   Carbapenem resistance NOT DETECTED NOT DETECTED Final   Haemophilus influenzae NOT DETECTED NOT DETECTED Final   Neisseria meningitidis NOT DETECTED NOT DETECTED Final   Pseudomonas aeruginosa NOT DETECTED NOT DETECTED Final   Candida albicans NOT DETECTED NOT DETECTED Final   Candida glabrata NOT DETECTED NOT DETECTED Final   Candida krusei NOT DETECTED NOT DETECTED Final   Candida parapsilosis NOT DETECTED NOT DETECTED Final   Candida tropicalis NOT DETECTED NOT DETECTED Final    Comment: Performed at [facility]         Radiology Studies: Dg Chest 2 View  08/17/2015  CLINICAL DATA:  Fever, weakness, body aches, and nausea starting during the night. EXAM: CHEST  2 VIEW COMPARISON:  10/24/2008 FINDINGS: Cardiac pacemaker. Normal heart size and pulmonary vascularity. No focal airspace disease or consolidation in the lungs. No  blunting of costophrenic angles. No pneumothorax. Mediastinal contours appear intact. Calcification of the aorta. IMPRESSION: No active cardiopulmonary disease. Electronically Signed   By: Lucienne Capers M.D.   On: 08/17/2015 05:11        Scheduled Meds: . diltiazem  240 mg Oral Daily  . fluconazole  150 mg Oral Daily  . metoprolol  50 mg Oral BID  . sodium chloride flush  3 mL Intravenous Q12H  . vancomycin  2,000 mg Intravenous Once   Followed by  . [START ON 08/19/2015] vancomycin  1,000 mg Intravenous Q12H   Continuous Infusions:    LOS: 1 day    Time spent: 25 minutes. Greater than 50% of this time was spent in direct contact with the patient coordinating care.     Lelon Frohlich, MD [facility] Pager [phone]  If 7PM-7AM, please contact night-coverage [email or web address] Password [facility] 08/18/2015, 4:16 PM

## 2015-08-18 NOTE — Progress Notes (Signed)
ANTICOAGULATION CONSULT NOTE - Follow Up Consult  Pharmacy Consult for Coumadin Indication: atrial fibrillation  No Known Allergies  Patient Measurements: Height: 5' (152.4 cm) Weight: 280 lb (127.007 kg) IBW/kg (Calculated) : 45.5 HEPARIN DW (KG): 77.9  Vital Signs: Temp: 98.2 F (36.8 C) (05/22 1330) Temp Source: Oral (05/22 0530) BP: 91/40 mmHg (05/22 1330) Pulse Rate: 70 (05/22 1330)  Labs:  Recent Labs  08/17/15 0310 08/17/15 0335  HGB 13.8  --   HCT 41.4  --   PLT 248  --   LABPROT  --  25.2*  INR  --  2.32*  CREATININE 0.92  --   TROPONINI <0.03  --     Estimated Creatinine Clearance: 69.2 mL/min (by C-G formula based on Cr of 0.92).   Medications:  Prescriptions prior to admission  Medication Sig Dispense Refill Last Dose  . acetaminophen (TYLENOL) 500 MG tablet Take 500 mg by mouth every 6 (six) hours as needed for mild pain or moderate pain.   08/16/2015 at Unknown time  . ALPRAZolam (XANAX) 0.25 MG tablet Take 0.25 mg by mouth at bedtime as needed for anxiety or sleep.    08/16/2015 at Unknown time  . atorvastatin (LIPITOR) 40 MG tablet Take 40 mg by mouth at bedtime.    08/16/2015 at Unknown time  . clotrimazole-betamethasone (LOTRISONE) cream Apply 1 application topically daily as needed (for affected area(S)).   unknown  . diltiazem (CARDIZEM CD) 240 MG 24 hr capsule Take 240 mg by mouth daily.   08/16/2015 at Unknown time  . esomeprazole (NEXIUM) 40 MG capsule Take 40 mg by mouth daily as needed (acid reflux).    08/14/2015  . levothyroxine (SYNTHROID, LEVOTHROID) 150 MCG tablet Take 150 mcg by mouth every morning.    08/16/2015 at Unknown time  . metoprolol (LOPRESSOR) 100 MG tablet TAKE ONE-HALF TABLET BY MOUTH TWICE DAILY 30 tablet 6 08/16/2015 at 2100  . Vitamin D, Ergocalciferol, (DRISDOL) 50000 units CAPS capsule Take 50,000 Units by mouth every 7 (seven) days. Takes on Thursdays.   08/14/2015  . warfarin (COUMADIN) 3 MG tablet Take 1 1/2 tablets daily  except 1 tablet on Mondays, Wednesdays and Fridays (Patient taking differently: Take 3-4.5 mg by mouth See admin instructions. Take 1 1/2 tablets (4.5 mg) on daily, except take 1 tablet (3 mg) on Monday, Wednesday and Friday.) 45 tablet 4 08/16/2015 at 2100    Assessment: 70 yo female to continue coumadin for afib. Followed by cardiology clinic in [place]. Patient takes 3mg  MWF, and 4.5mg  on T, TH, Sat, Sun. INR 2.32, continue current regimen.  Goal of Therapy:  INR 2-3 Monitor platelets by anticoagulation protocol: Yes   Plan:  Coumadin 3mg  x 1 today PT/INR daily  Monitor for s/s of bleeding  Isac Sarna, BS [place], BCPS Clinical Pharmacist Pager [phone] 08/18/2015,4:35 PM

## 2015-08-18 NOTE — Care Management Important Message (Signed)
Important Message  Patient Details  Name: ALLIEN BEARUP MRN: [id number] Date of Birth: [birth year]   Medicare Important Message Given:  Yes    Kaiea Esselman, [place]un[place]y [place], RN 08/18/2015, 10:07 AM

## 2015-08-18 NOTE — Progress Notes (Addendum)
Pharmacy Antibiotic Note  Megan [name] is a 69 y.o. female admitted on 08/17/2015 with bacteremia.  Pharmacy has been consulted for Vancomycin dosing.  Plan: Vancomycin 2000mg  loading dose then 1000mg  IV every 12 hours.  Goal trough 15-20 mcg/mL.  Height: 5' (152.4 cm) Weight: 280 lb (127.007 kg) IBW/kg (Calculated) : 45.5  Temp (24hrs), Avg:98.7 F (37.1 C), Min:98.2 F (36.8 C), Max:99.3 F (37.4 C)   Recent Labs Lab 08/17/15 0310 08/17/15 0323 08/17/15 0552  WBC 27.5*  --   --   CREATININE 0.92  --   --   LATICACIDVEN  --  2.1* 2.5*    Estimated Creatinine Clearance: 69.2 mL/min (by C-G formula based on Cr of 0.92).    No Known Allergies  Antimicrobials this admission: Clindamycin 5/21 >> 5/22 Vancomycin 5/22>>   Microbiology results: 5/21BCx: + GPC 5/21 UCx: pending  Thank you for allowing pharmacy to be a part of this patient's care. Isac Sarna, BS Pharm D, [place] Clinical Pharmacist Pager [phone]  08/18/2015 1:11 PM

## 2015-08-19 DIAGNOSIS — L03031 Cellulitis of right toe: Secondary | ICD-10-CM

## 2015-08-19 LAB — PROTIME-INR
INR: 2.46 — ABNORMAL HIGH (ref 0.00–1.49)
PROTHROMBIN TIME: 26.3 s — AB (ref 11.6–15.2)

## 2015-08-19 MED ORDER — CEFAZOLIN SODIUM-DEXTROSE 2-4 GM/100ML-% IV SOLN
2.0000 g | Freq: Three times a day (TID) | INTRAVENOUS | Status: DC
  Administered 2015-08-19 – 2015-08-20 (×3): 2 g via INTRAVENOUS
  Filled 2015-08-19 (×4): qty 100

## 2015-08-19 MED ORDER — WARFARIN SODIUM 2 MG PO TABS
4.5000 mg | ORAL_TABLET | Freq: Once | ORAL | Status: AC
  Administered 2015-08-19 (×2): 4.5 mg via ORAL
  Filled 2015-08-19: qty 1

## 2015-08-19 NOTE — Progress Notes (Signed)
Pharmacy Antibiotic Note  Megan [name] is a 70 y.o. female admitted on 08/17/2015 with bacteremia.  Pharmacy has been consulted for Cefazolin dosing. 70 yo female with cellulitis of lower extremity and +Staph species in blood without Methicillin resistance, non-staph aureus Plan: Cefazolin 2gm IV every 8 hours  Height: 5' (152.4 cm) Weight: 278 lb 12.8 oz (126.463 kg) IBW/kg (Calculated) : 45.5  Temp (24hrs), Avg:98 F (36.7 C), Min:97.6 F (36.4 C), Max:98.2 F (36.8 C)   Recent Labs Lab 08/17/15 0310 08/17/15 0323 08/17/15 0552  WBC 27.5*  --   --   CREATININE 0.92  --   --   LATICACIDVEN  --  2.1* 2.5*    Estimated Creatinine Clearance: 69 mL/min (by C-G formula based on Cr of 0.92).    No Known Allergies  Antimicrobials this admission: Clindamycin 5/21 >> 5/22 Vancomycin 5/22>> 5/23 Cefazolin 5/23>.  Microbiology results: 5/21BCx: + GPC identified by BCID as staph species without Methicillin resistance, non-staph aureus 5/21 UCx: no growth  Thank you for allowing pharmacy to be a part of this patient's care. Isac Sarna, BS [place], [place] Clinical Pharmacist Pager [phone]  08/19/2015 12:06 PM

## 2015-08-19 NOTE — Progress Notes (Signed)
PROGRESS NOTE    Megan [name]  [id number] DOB: 1946/02/18 DOA: 08/17/2015 PCP: Octavio Graves, DO     Brief Narrative:  70 year old woman admitted on 5/21 with left lower extremity cellulitis. Was placed on clindamycin. One out of 2 blood cultures has returned with gram-positive cocci in clusters. Antibiotics were transitioned to vancomycin on 5/22. Blood culture ID panel has resulted with Staphylococcus species that is not staph aureus without methicillin resistance and will transition over to Ancef today pending final results of blood culture.   Assessment & Plan:   Principal Problem:   Cellulitis Active Problems:   ATRIAL FIBRILLATION   PPM-St.Jude   Long term (current) use of anticoagulants   Weakness generalized   Leukocytosis   Cellulitis/gram-positive bacteremia -No indication of sepsis. -Cellulitis appears improved today. -Transition vancomycin over 2 Ancef based on results of BCID panel.  Leukocytosis -Secondary to cellulitis and bacteremia. -Recheck WBC count in a.m.   Atrial fibrillation -Rate controlled, continue Coumadin.     DVT prophylaxis: On Coumadin Code Status: Full code Family Communication: Patient only Disposition Plan: Anticipate discharge home in 24-48 hours approximately  Consultants:   None  Procedures:   None  Antimicrobials:   Vancomycin    Subjective: Feels improved, left leg is less tender and swollen.  Objective: Filed Vitals:   08/18/15 1330 08/18/15 2219 08/19/15 0635 08/19/15 0743  BP: 91/40 128/44 117/62   Pulse: 70 68 79   Temp: 98.2 F (36.8 C) 97.6 F (36.4 C) 98.2 F (36.8 C)   TempSrc:  Oral Oral   Resp: 20 20 19    Height:      Weight:    126.463 kg (278 lb 12.8 oz)  SpO2: 97% 97% 99%     Intake/Output Summary (Last 24 hours) at 08/19/15 1242 Last data filed at 08/19/15 0930  Gross per 24 hour  Intake    243 ml  Output    400 ml  Net   -157 ml   Filed Weights   08/17/15 0308  08/19/15 0743  Weight: 127.007 kg (280 lb) 126.463 kg (278 lb 12.8 oz)    Examination:  General exam: Alert, awake, oriented x 3 Respiratory system: Clear to auscultation. Respiratory effort normal. Cardiovascular system:RRR. No murmurs, rubs, gallops. Gastrointestinal system: Abdomen is nondistended, soft and nontender. No organomegaly or masses felt. Normal bowel sounds heard. Central nervous system: Alert and oriented. No focal neurological deficits. Extremities: Chronic bilateral lymphedema, left leg is erythematous and warm to touch from ankle all the way up to above the knee. Skin: No rashes, lesions or ulcers Psychiatry: Judgement and insight appear normal. Mood & affect appropriate.     Data Reviewed: I have personally reviewed following labs and imaging studies  CBC:  Recent Labs Lab 08/17/15 0310  WBC 27.5*  NEUTROABS 25.0*  HGB 13.8  HCT 41.4  MCV 95.8  PLT [id number]   Basic Metabolic Panel:  Recent Labs Lab 08/17/15 0310  NA 132*  K 4.2  CL 100*  CO2 22  GLUCOSE 172*  BUN 22*  CREATININE 0.92  CALCIUM 8.7*   GFR: Estimated Creatinine Clearance: 69 mL/min (by C-G formula based on Cr of 0.92). Liver Function Tests:  Recent Labs Lab 08/17/15 0310  AST 17  ALT 21  ALKPHOS 112  BILITOT 2.0*  PROT 7.4  ALBUMIN 3.6    Recent Labs Lab 08/17/15 0310  LIPASE 15   No results for input(s): AMMONIA in the last 168 hours. Coagulation Profile:  Recent Labs Lab 08/17/15 0335 08/19/15 0553  INR 2.32* 2.46*   Cardiac Enzymes:  Recent Labs Lab 08/17/15 0310  TROPONINI <0.03   BNP (last 3 results) No results for input(s): PROBNP in the last 8760 hours. HbA1C: No results for input(s): HGBA1C in the last 72 hours. CBG:  Recent Labs Lab 08/17/15 0801 08/17/15 1158 08/17/15 1651  GLUCAP 125* 103* 122*   Lipid Profile: No results for input(s): CHOL, HDL, LDLCALC, TRIG, CHOLHDL, LDLDIRECT in the last 72 hours. Thyroid Function Tests: No  results for input(s): TSH, T4TOTAL, FREET4, T3FREE, THYROIDAB in the last 72 hours. Anemia Panel: No results for input(s): VITAMINB12, FOLATE, FERRITIN, TIBC, IRON, RETICCTPCT in the last 72 hours. Urine analysis:    Component Value Date/Time   COLORURINE YELLOW 08/17/2015 0416   APPEARANCEUR CLEAR 08/17/2015 0416   LABSPEC 1.025 08/17/2015 0416   PHURINE 5.5 08/17/2015 0416   GLUCOSEU NEGATIVE 08/17/2015 0416   HGBUR TRACE* 08/17/2015 0416   BILIRUBINUR NEGATIVE 08/17/2015 0416   KETONESUR NEGATIVE 08/17/2015 0416   PROTEINUR NEGATIVE 08/17/2015 0416   UROBILINOGEN 0.2 02/01/2014 1811   NITRITE NEGATIVE 08/17/2015 0416   LEUKOCYTESUR NEGATIVE 08/17/2015 0416   Sepsis Labs: @LABRCNTIP (procalcitonin:4,lacticidven:4)  ) Recent Results (from the past 240 hour(s))  Urine culture     Status: None   Collection Time: 08/17/15  4:16 AM  Result Value Ref Range Status   Specimen Description URINE, CATHETERIZED  Final   Special Requests NONE  Final   Culture NO GROWTH Performed at [facility]   Final   Report Status 08/18/2015 FINAL  Final  Culture, blood (routine x 2)     Status: None (Preliminary result)   Collection Time: 08/17/15  5:44 AM  Result Value Ref Range Status   Specimen Description BLOOD BLOOD RIGHT HAND  Final   Special Requests BOTTLES DRAWN AEROBIC AND ANAEROBIC 4CC  Final   Culture NO GROWTH 2 DAYS  Final   Report Status PENDING  Incomplete  Culture, blood (routine x 2)     Status: None (Preliminary result)   Collection Time: 08/17/15  5:54 AM  Result Value Ref Range Status   Specimen Description BLOOD LEFT ANTECUBITAL  Final   Special Requests BOTTLES DRAWN AEROBIC AND ANAEROBIC 4CC  Final   Culture  Setup Time   Final    GRAM POSITIVE COCCI IN CLUSTERS RECOVERED FROM THE ANAEROBIC BOTTLE Gram Stain Report Called to,Read Back By and Verified With: COVINGTON,L. AT [id number] ON 08/18/2015 BY BAUGHAM,M. Performed at [facility] ID to  follow CRITICAL RESULT CALLED TO, READ BACK BY AND VERIFIED WITH: L POOLE,PHARM D AT 1455 ON [id number] BY S YARBROUGH    Culture   Final    GRAM POSITIVE COCCI CULTURE REINCUBATED FOR BETTER GROWTH Performed at [facility]    Report Status PENDING  Incomplete  Blood Culture ID Panel (Reflexed)     Status: Abnormal   Collection Time: 08/17/15  5:54 AM  Result Value Ref Range Status   Enterococcus species NOT DETECTED NOT DETECTED Final   Vancomycin resistance NOT DETECTED NOT DETECTED Final   Listeria monocytogenes NOT DETECTED NOT DETECTED Final   Staphylococcus species DETECTED (A) NOT DETECTED Final    Comment: CRITICAL RESULT CALLED TO, READ BACK BY AND VERIFIED WITH: L. POOLE, PHARM D AT 1455 ON [id number] BY S. YARBROUGH    Staphylococcus aureus NOT DETECTED NOT DETECTED Final   Methicillin resistance NOT DETECTED NOT DETECTED Final   Streptococcus species NOT  DETECTED NOT DETECTED Final   Streptococcus agalactiae NOT DETECTED NOT DETECTED Final   Streptococcus pneumoniae NOT DETECTED NOT DETECTED Final   Streptococcus pyogenes NOT DETECTED NOT DETECTED Final   Acinetobacter baumannii NOT DETECTED NOT DETECTED Final   Enterobacteriaceae species NOT DETECTED NOT DETECTED Final   Enterobacter cloacae complex NOT DETECTED NOT DETECTED Final   Escherichia coli NOT DETECTED NOT DETECTED Final   Klebsiella oxytoca NOT DETECTED NOT DETECTED Final   Klebsiella pneumoniae NOT DETECTED NOT DETECTED Final   Proteus species NOT DETECTED NOT DETECTED Final   Serratia marcescens NOT DETECTED NOT DETECTED Final   Carbapenem resistance NOT DETECTED NOT DETECTED Final   Haemophilus influenzae NOT DETECTED NOT DETECTED Final   Neisseria meningitidis NOT DETECTED NOT DETECTED Final   Pseudomonas aeruginosa NOT DETECTED NOT DETECTED Final   Candida albicans NOT DETECTED NOT DETECTED Final   Candida glabrata NOT DETECTED NOT DETECTED Final   Candida krusei NOT DETECTED NOT DETECTED Final    Candida parapsilosis NOT DETECTED NOT DETECTED Final   Candida tropicalis NOT DETECTED NOT DETECTED Final    Comment: Performed at [facility]         Radiology Studies: No results found.      Scheduled Meds: . atorvastatin  40 mg Oral QHS  .  ceFAZolin (ANCEF) IV  2 g Intravenous Q8H  . diltiazem  240 mg Oral Daily  . fluconazole  150 mg Oral Daily  . levothyroxine  150 mcg Oral q morning - 10a  . metoprolol  50 mg Oral BID  . pantoprazole  40 mg Oral Daily  . sodium chloride flush  3 mL Intravenous Q12H  . warfarin  4.5 mg Oral Once  . Warfarin - Pharmacist Dosing Inpatient   Does not apply q1800   Continuous Infusions:    LOS: 2 days    Time spent: 25 minutes. Greater than 50% of this time was spent in direct contact with the patient coordinating care.     Lelon Frohlich, MD [facility] Pager [phone]  If 7PM-7AM, please contact night-coverage [email or web address] Password [email or web address] 08/19/2015, 12:42 PM

## 2015-08-19 NOTE — Care Management Note (Signed)
Case Management Note  Patient Details  Name: Megan [name] MRN: [id number] Date of Birth: November 04, 1945  Subjective/Objective: Spoke with patient who is from home with son whom patient stated works nights and is at home during the day. Patient stated that she continues to drive self to grocery store and Dr appointments. Patient discribes herself a independent and stated that she uses a cane to ambulate. She also has a walker at home. Denies difficulty getting her scripts filled.  No Cm needs identified.                     Action/Plan:Home with self care.   Expected Discharge Date:                  Expected Discharge Plan:  Home/Self Care  In-House Referral:     Discharge planning Services  CM Consult  Post Acute Care Choice:    Choice offered to:     DME Arranged:    DME Agency:     HH Arranged:    [place] Agency:     Status of Service:  Completed, signed off  Medicare Important Message Given:  Yes Date Medicare IM Given:    Medicare IM give by:    Date Additional Medicare IM Given:    Additional Medicare Important Message give by:     If discussed at [place] of Stay Meetings, dates discussed:    Additional Comments:  Alvie Heidelberg, RN 08/19/2015, 12:26 PM

## 2015-08-19 NOTE — Progress Notes (Signed)
ANTICOAGULATION CONSULT NOTE - Follow Up Consult  Pharmacy Consult for Coumadin Indication: atrial fibrillation  No Known Allergies  Patient Measurements: Height: 5' (152.4 cm) Weight: 278 lb 12.8 oz (126.463 kg) IBW/kg (Calculated) : 45.5 HEPARIN DW (KG): 77.9  Vital Signs: Temp: 98.2 F (36.8 C) (05/23 0635) Temp Source: Oral (05/23 0635) BP: 117/62 mmHg (05/23 0635) Pulse Rate: 79 (05/23 0635)  Labs:  Recent Labs  08/17/15 0310 08/17/15 0335 08/19/15 0553  HGB 13.8  --   --   HCT 41.4  --   --   PLT 248  --   --   LABPROT  --  25.2* 26.3*  INR  --  2.32* 2.46*  CREATININE 0.92  --   --   TROPONINI <0.03  --   --     Estimated Creatinine Clearance: 69 mL/min (by C-G formula based on Cr of 0.92).   Medications:  Prescriptions prior to admission  Medication Sig Dispense Refill Last Dose  . acetaminophen (TYLENOL) 500 MG tablet Take 500 mg by mouth every 6 (six) hours as needed for mild pain or moderate pain.   08/16/2015 at Unknown time  . ALPRAZolam (XANAX) 0.25 MG tablet Take 0.25 mg by mouth at bedtime as needed for anxiety or sleep.    08/16/2015 at Unknown time  . atorvastatin (LIPITOR) 40 MG tablet Take 40 mg by mouth at bedtime.    08/16/2015 at Unknown time  . clotrimazole-betamethasone (LOTRISONE) cream Apply 1 application topically daily as needed (for affected area(S)).   unknown  . diltiazem (CARDIZEM CD) 240 MG 24 hr capsule Take 240 mg by mouth daily.   08/16/2015 at Unknown time  . esomeprazole (NEXIUM) 40 MG capsule Take 40 mg by mouth daily as needed (acid reflux).    08/14/2015  . levothyroxine (SYNTHROID, LEVOTHROID) 150 MCG tablet Take 150 mcg by mouth every morning.    08/16/2015 at Unknown time  . metoprolol (LOPRESSOR) 100 MG tablet TAKE ONE-HALF TABLET BY MOUTH TWICE DAILY 30 tablet 6 08/16/2015 at 2100  . Vitamin D, Ergocalciferol, (DRISDOL) 50000 units CAPS capsule Take 50,000 Units by mouth every 7 (seven) days. Takes on Thursdays.   08/14/2015  .  warfarin (COUMADIN) 3 MG tablet Take 1 1/2 tablets daily except 1 tablet on Mondays, Wednesdays and Fridays (Patient taking differently: Take 3-4.5 mg by mouth See admin instructions. Take 1 1/2 tablets (4.5 mg) on daily, except take 1 tablet (3 mg) on Monday, Wednesday and Friday.) 45 tablet 4 08/16/2015 at 2100    Assessment: 70 yo female to continue coumadin for afib. Followed by cardiology clinic in [place]. Patient takes 3mg  MWF, and 4.5mg  on T, TH, Sat, Sun. INR 2.46 today, continue current regimen.  Goal of Therapy:  INR 2-3 Monitor platelets by anticoagulation protocol: Yes   Plan:  Coumadin 4.5mg  x 1 today PT/INR daily  Monitor for s/s of bleeding  Isac Sarna, BS [place], BCPS Clinical Pharmacist Pager [phone] 08/19/2015,12:16 PM

## 2015-08-20 DIAGNOSIS — D72829 Elevated white blood cell count, unspecified: Secondary | ICD-10-CM

## 2015-08-20 DIAGNOSIS — I10 Essential (primary) hypertension: Secondary | ICD-10-CM

## 2015-08-20 DIAGNOSIS — I482 Chronic atrial fibrillation: Secondary | ICD-10-CM

## 2015-08-20 DIAGNOSIS — A419 Sepsis, unspecified organism: Secondary | ICD-10-CM

## 2015-08-20 DIAGNOSIS — L0390 Cellulitis, unspecified: Secondary | ICD-10-CM

## 2015-08-20 LAB — CBC
HCT: 39 % (ref 36.0–46.0)
Hemoglobin: 12.8 g/dL (ref 12.0–15.0)
MCH: 31.8 pg (ref 26.0–34.0)
MCHC: 32.8 g/dL (ref 30.0–36.0)
MCV: 96.8 fL (ref 78.0–100.0)
PLATELETS: 279 10*3/uL (ref 150–400)
RBC: 4.03 MIL/uL (ref 3.87–5.11)
RDW: 14.3 % (ref 11.5–15.5)
WBC: 7.8 10*3/uL (ref 4.0–10.5)

## 2015-08-20 LAB — BASIC METABOLIC PANEL
ANION GAP: 7 (ref 5–15)
BUN: 16 mg/dL (ref 6–20)
CHLORIDE: 107 mmol/L (ref 101–111)
CO2: 24 mmol/L (ref 22–32)
CREATININE: 0.83 mg/dL (ref 0.44–1.00)
Calcium: 8.5 mg/dL — ABNORMAL LOW (ref 8.9–10.3)
GFR calc non Af Amer: >60 mL/min (ref >60)
Glucose, Bld: 98 mg/dL (ref 65–99)
POTASSIUM: 3.9 mmol/L (ref 3.5–5.1)
SODIUM: 138 mmol/L (ref 135–145)

## 2015-08-20 LAB — PROTIME-INR
INR: 2.16 — ABNORMAL HIGH (ref 0.00–1.49)
Prothrombin Time: 23.9 seconds — ABNORMAL HIGH (ref 11.6–15.2)

## 2015-08-20 MED ORDER — CEPHALEXIN 500 MG PO CAPS: 500.0000 mg | ORAL_CAPSULE | Freq: Three times a day (TID) | ORAL | Status: DC

## 2015-08-20 MED ORDER — NYSTATIN 100000 UNIT/GM EX CREA: 1.0000 "application " | TOPICAL_CREAM | Freq: Two times a day (BID) | CUTANEOUS | Status: DC

## 2015-08-20 NOTE — Progress Notes (Signed)
Pt IV removed, tolerated well.  Reviewed discharge instructions with pt and answered all questions at this time.  Will continue to monitor pt until they leave the floor.

## 2015-08-20 NOTE — Discharge Summary (Signed)
Physician Discharge Summary  Megan [name] [id number] DOB: [birth year] DOA: 08/17/2015  PCP: Octavio Graves, DO  Admit date: 08/17/2015 Discharge date: 08/20/2015  Time spent: 20 minutes  Recommendations for Outpatient Follow-up:  1. Follow up with PCP in 2-3 weeks   Discharge Diagnoses:  Principal Problem:   Sepsis due to cellulitis [facility]) Active Problems:   HYPERTENSION, BENIGN   ATRIAL FIBRILLATION   PPM-St.Jude   Long term (current) use of anticoagulants   Obesity   Lymphedema of left leg   Morbid obesity due to excess calories (HCC)   Weakness generalized   Leukocytosis   Cellulitis   Discharge Condition: Improved  Diet recommendation: Heart healthy  Filed Weights   08/17/15 0308 08/19/15 0743 08/20/15 0550  Weight: 127.007 kg (280 lb) 126.463 kg (278 lb 12.8 oz) 125.692 kg (277 lb 1.6 oz)    History of present illness:  Please review dictated H and P from 5/21 for details. Briefly, 70 year old woman admitted on 5/21 with left lower extremity cellulitis In the setting of lipedema. She was admitted for further workup.  Hospital Course:   Cellulitis with sepsis present on admission -Patient did present septic with a presenting rectal temperature of 100.54F and white blood count of 27.5 thousand in the setting of lower extremity cellulitis. -Patient had clinically improved on empiric antibiotics. -Leukocytosis has normalized and patient has remained afebrile. Patch of erythema has improved -Patient will complete a course of Keflex on discharge. -Of note, patient did have 1 out of 2 gram-positive organisms per blood culture which was later found to be coag negative staph, likely contaminant.  Leukocytosis -Secondary to cellulitis and bacteremia. -Resolved   Atrial fibrillation -Rate controlled -continued Coumadin.  Obesity -Remained stable   Discharge Exam: Filed Vitals:   08/19/15 1405 08/19/15 2123 08/19/15 2212 08/20/15 0550  BP: 128/48  140/80  125/56  Pulse: 76  88 85  Temp: 97.7 F (36.5 C)  97.7 F (36.5 C) 97.5 F (36.4 C)  TempSrc: Oral  Oral Oral  Resp: 19  20 20   Height:      Weight:    125.692 kg (277 lb 1.6 oz)  SpO2: 99% 95% 99% 99%    General: Awake, in nad Cardiovascular: regular, s1, s2 Respiratory: normal resp effort, no wheezing  Discharge Instructions     Medication List    TAKE these medications        acetaminophen 500 MG tablet  Commonly known as:  TYLENOL  Take 500 mg by mouth every 6 (six) hours as needed for mild pain or moderate pain.     ALPRAZolam 0.25 MG tablet  Commonly known as:  XANAX  Take 0.25 mg by mouth at bedtime as needed for anxiety or sleep.     atorvastatin 40 MG tablet  Commonly known as:  LIPITOR  Take 40 mg by mouth at bedtime.     cephALEXin 500 MG capsule  Commonly known as:  KEFLEX  Take 1 capsule (500 mg total) by mouth every 8 (eight) hours.  Notes to Patient:  **New Prescription**Take as directed      clotrimazole-betamethasone cream  Commonly known as:  LOTRISONE  Apply 1 application topically daily as needed (for affected area(S)).     diltiazem 240 MG 24 hr capsule  Commonly known as:  CARDIZEM CD  Take 240 mg by mouth daily.     esomeprazole 40 MG capsule  Commonly known as:  NEXIUM  Take 40 mg by mouth daily as needed (acid reflux).  levothyroxine 150 MCG tablet  Commonly known as:  SYNTHROID, LEVOTHROID  Take 150 mcg by mouth every morning.     metoprolol 100 MG tablet  Commonly known as:  LOPRESSOR  TAKE ONE-HALF TABLET BY MOUTH TWICE DAILY     nystatin cream  Commonly known as:  MYCOSTATIN  Apply 1 application topically 2 (two) times daily. To affected areas     Vitamin D (Ergocalciferol) 50000 units Caps capsule  Commonly known as:  DRISDOL  Take 50,000 Units by mouth every 7 (seven) days. Takes on Thursdays.  Notes to Patient:  Take as directed, every Thursday     warfarin 3 MG tablet  Commonly known as:  COUMADIN  Take 1  1/2 tablets daily except 1 tablet on Mondays, Wednesdays and Fridays  Notes to Patient:  Take as directed        No Known Allergies Follow-up Information    Follow up with [place], DO. Schedule an appointment as soon as possible for a visit in 2 weeks.   Why:  Hospital follow up   Contact information:   [address] [place] [address] [phone]        The results of significant diagnostics from this hospitalization (including imaging, microbiology, ancillary and laboratory) are listed below for reference.    Significant Diagnostic Studies: Dg Chest 2 View  08/17/2015  CLINICAL DATA:  Fever, weakness, body aches, and nausea starting during the night. EXAM: CHEST  2 VIEW COMPARISON:  10/24/2008 FINDINGS: Cardiac pacemaker. Normal heart size and pulmonary vascularity. No focal airspace disease or consolidation in the lungs. No blunting of costophrenic angles. No pneumothorax. Mediastinal contours appear intact. Calcification of the aorta. IMPRESSION: No active cardiopulmonary disease. Electronically Signed   By: Lucienne Capers M.D.   On: 08/17/2015 05:11    Microbiology: Recent Results (from the past 240 hour(s))  Urine culture     Status: None   Collection Time: 08/17/15  4:16 AM  Result Value Ref Range Status   Specimen Description URINE, CATHETERIZED  Final   Special Requests NONE  Final   Culture NO GROWTH Performed at [facility]   Final   Report Status 08/18/2015 FINAL  Final  Culture, blood (routine x 2)     Status: None (Preliminary result)   Collection Time: 08/17/15  5:44 AM  Result Value Ref Range Status   Specimen Description BLOOD BLOOD RIGHT HAND  Final   Special Requests BOTTLES DRAWN AEROBIC AND ANAEROBIC 4CC  Final   Culture NO GROWTH 3 DAYS  Final   Report Status PENDING  Incomplete  Culture, blood (routine x 2)     Status: Abnormal (Preliminary result)   Collection Time: 08/17/15  5:54 AM  Result Value Ref Range Status   Specimen  Description BLOOD LEFT ANTECUBITAL  Final   Special Requests BOTTLES DRAWN AEROBIC AND ANAEROBIC 4CC  Final   Culture  Setup Time   Final    GRAM POSITIVE COCCI IN CLUSTERS RECOVERED FROM THE ANAEROBIC BOTTLE Gram Stain Report Called to,Read Back By and Verified With: COVINGTON,L. AT 1006 ON 08/18/2015 BY BAUGHAM,M. Performed at [facility], READ BACK BY AND VERIFIED WITH: L POOLE,PHARM D AT 1455 ON [id number] BY S YARBROUGH    Culture (A)  Final    STAPHYLOCOCCUS SPECIES (COAGULASE NEGATIVE) THE SIGNIFICANCE OF ISOLATING THIS ORGANISM FROM A SINGLE SET OF BLOOD CULTURES WHEN MULTIPLE SETS ARE DRAWN IS UNCERTAIN. PLEASE NOTIFY THE MICROBIOLOGY DEPARTMENT WITHIN ONE  WEEK IF SPECIATION AND SENSITIVITIES ARE REQUIRED. Performed at [facility]    Report Status PENDING  Incomplete  Blood Culture ID Panel (Reflexed)     Status: Abnormal   Collection Time: 08/17/15  5:54 AM  Result Value Ref Range Status   Enterococcus species NOT DETECTED NOT DETECTED Final   Vancomycin resistance NOT DETECTED NOT DETECTED Final   Listeria monocytogenes NOT DETECTED NOT DETECTED Final   Staphylococcus species DETECTED (A) NOT DETECTED Final    Comment: CRITICAL RESULT CALLED TO, READ BACK BY AND VERIFIED WITH: L. POOLE, PHARM D AT 1455 ON [id number] BY S. YARBROUGH    Staphylococcus aureus NOT DETECTED NOT DETECTED Final   Methicillin resistance NOT DETECTED NOT DETECTED Final   Streptococcus species NOT DETECTED NOT DETECTED Final   Streptococcus agalactiae NOT DETECTED NOT DETECTED Final   Streptococcus pneumoniae NOT DETECTED NOT DETECTED Final   Streptococcus pyogenes NOT DETECTED NOT DETECTED Final   Acinetobacter baumannii NOT DETECTED NOT DETECTED Final   Enterobacteriaceae species NOT DETECTED NOT DETECTED Final   Enterobacter cloacae complex NOT DETECTED NOT DETECTED Final   Escherichia coli NOT DETECTED NOT DETECTED Final   Klebsiella oxytoca NOT DETECTED NOT  DETECTED Final   Klebsiella pneumoniae NOT DETECTED NOT DETECTED Final   Proteus species NOT DETECTED NOT DETECTED Final   Serratia marcescens NOT DETECTED NOT DETECTED Final   Carbapenem resistance NOT DETECTED NOT DETECTED Final   Haemophilus influenzae NOT DETECTED NOT DETECTED Final   Neisseria meningitidis NOT DETECTED NOT DETECTED Final   Pseudomonas aeruginosa NOT DETECTED NOT DETECTED Final   Candida albicans NOT DETECTED NOT DETECTED Final   Candida glabrata NOT DETECTED NOT DETECTED Final   Candida krusei NOT DETECTED NOT DETECTED Final   Candida parapsilosis NOT DETECTED NOT DETECTED Final   Candida tropicalis NOT DETECTED NOT DETECTED Final    Comment: Performed at [facility]: Basic Metabolic Panel:  Recent Labs Lab 08/17/15 0310 08/20/15 0416  NA 132* 138  K 4.2 3.9  CL 100* 107  CO2 22 24  GLUCOSE 172* 98  BUN 22* 16  CREATININE 0.92 0.83  CALCIUM 8.7* 8.5*   Liver Function Tests:  Recent Labs Lab 08/17/15 0310  AST 17  ALT 21  ALKPHOS 112  BILITOT 2.0*  PROT 7.4  ALBUMIN 3.6    Recent Labs Lab 08/17/15 0310  LIPASE 15   No results for input(s): AMMONIA in the last 168 hours. CBC:  Recent Labs Lab 08/17/15 0310 08/20/15 0416  WBC 27.5* 7.8  NEUTROABS 25.0*  --   HGB 13.8 12.8  HCT 41.4 39.0  MCV 95.8 96.8  PLT 248 279   Cardiac Enzymes:  Recent Labs Lab 08/17/15 0310  TROPONINI <0.03   BNP: BNP (last 3 results) No results for input(s): BNP in the last 8760 hours.  ProBNP (last 3 results) No results for input(s): PROBNP in the last 8760 hours.  CBG:  Recent Labs Lab 08/17/15 0801 08/17/15 1158 08/17/15 1651  GLUCAP 125* 103* 122*    Signed:  Jaxn Chiquito K  [facility] 08/20/2015, 4:25 PM

## 2015-08-20 NOTE — Care Management Important Message (Signed)
Important Message  Patient Details  Name: Megan [name] MRN: [id number] Date of Birth: 05/01/1945   Medicare Important Message Given:  Yes    Alvie Heidelberg, RN 08/20/2015, 11:06 AM

## 2015-08-21 LAB — CULTURE, BLOOD (ROUTINE X 2)

## 2015-08-22 LAB — CULTURE, BLOOD (ROUTINE X 2): CULTURE: NO GROWTH

## 2015-08-28 ENCOUNTER — Ambulatory Visit (INDEPENDENT_AMBULATORY_CARE_PROVIDER_SITE_OTHER): Payer: Medicare Other | Admitting: *Deleted

## 2015-08-28 DIAGNOSIS — I4891 Unspecified atrial fibrillation: Secondary | ICD-10-CM | POA: Diagnosis not present

## 2015-08-28 DIAGNOSIS — Z5181 Encounter for therapeutic drug level monitoring: Secondary | ICD-10-CM

## 2015-08-28 LAB — POCT INR: INR: 2.4

## 2015-08-29 DIAGNOSIS — L0390 Cellulitis, unspecified: Secondary | ICD-10-CM | POA: Diagnosis not present

## 2015-08-29 DIAGNOSIS — R601 Generalized edema: Secondary | ICD-10-CM | POA: Diagnosis not present

## 2015-08-29 DIAGNOSIS — I890 Lymphedema, not elsewhere classified: Secondary | ICD-10-CM | POA: Diagnosis not present

## 2015-08-29 DIAGNOSIS — I2510 Atherosclerotic heart disease of native coronary artery without angina pectoris: Secondary | ICD-10-CM | POA: Diagnosis not present

## 2015-09-03 ENCOUNTER — Ambulatory Visit (HOSPITAL_COMMUNITY): Payer: Medicare Other | Admitting: Physical Therapy

## 2015-09-03 ENCOUNTER — Ambulatory Visit (INDEPENDENT_AMBULATORY_CARE_PROVIDER_SITE_OTHER): Payer: Medicare Other | Admitting: Internal Medicine

## 2015-09-03 ENCOUNTER — Encounter: Payer: Self-pay | Admitting: Internal Medicine

## 2015-09-03 VITALS — BP 120/82 | HR 52 | Ht 61.0 in | Wt 283.0 lb

## 2015-09-03 DIAGNOSIS — I4891 Unspecified atrial fibrillation: Secondary | ICD-10-CM

## 2015-09-03 DIAGNOSIS — I495 Sick sinus syndrome: Secondary | ICD-10-CM | POA: Diagnosis not present

## 2015-09-03 DIAGNOSIS — I482 Chronic atrial fibrillation: Secondary | ICD-10-CM | POA: Diagnosis not present

## 2015-09-03 DIAGNOSIS — E119 Type 2 diabetes mellitus without complications: Secondary | ICD-10-CM | POA: Diagnosis not present

## 2015-09-03 DIAGNOSIS — I4821 Permanent atrial fibrillation: Secondary | ICD-10-CM

## 2015-09-03 LAB — CUP PACEART INCLINIC DEVICE CHECK
Battery Remaining Longevity: 115.2
Battery Voltage: 2.95 V
Brady Statistic RA Percent Paced: 0 %
Brady Statistic RV Percent Paced: 41 %
Date Time Interrogation Session: 20170607161327
Implantable Lead Implant Date: 20100728
Implantable Lead Location: 753860
Lead Channel Pacing Threshold Amplitude: 0.75 V
Lead Channel Pacing Threshold Pulse Width: 0.4 ms
Lead Channel Sensing Intrinsic Amplitude: 9.1 mV
MDC IDC LEAD IMPLANT DT: 20100728
MDC IDC LEAD LOCATION: 753859
MDC IDC MSMT LEADCHNL RA SENSING INTR AMPL: 1.6 mV
MDC IDC MSMT LEADCHNL RV IMPEDANCE VALUE: 387.5 Ohm
MDC IDC MSMT LEADCHNL RV PACING THRESHOLD AMPLITUDE: 0.75 V
MDC IDC MSMT LEADCHNL RV PACING THRESHOLD PULSEWIDTH: 0.4 ms
MDC IDC PG SERIAL: 2339600
MDC IDC SET LEADCHNL RV PACING AMPLITUDE: 2.5 V
MDC IDC SET LEADCHNL RV PACING PULSEWIDTH: 0.4 ms
MDC IDC SET LEADCHNL RV SENSING SENSITIVITY: 2 mV
Pulse Gen Model: 2210

## 2015-09-03 NOTE — Progress Notes (Signed)
PCP: CYNTHIA BUTLER, DO  Megan [name] is a 70 y.o. female who presents today for routine electrophysiology followup.  Since last being seen in our clinic, the patient reports doing well.  She has not made any progress with weight reduction.  Recently hospitalized for cellulitis. Today, she denies symptoms of palpitations, chest pain, shortness of breath,  dizziness, presyncope, or syncope.  The patient is otherwise without complaint today.   Past Medical History  Diagnosis Date  . Hypertension   . Hyperlipidemia   . GERD (gastroesophageal reflux disease)   . Persistent atrial fibrillation (HCC)      post-termination pauses with afib s/p PPM  . Degenerative joint disease   . Acute diastolic heart failure ([place])   . Sinoatrial node dysfunction (HCC)     s/p PPM  . Congestive heart failure, unspecified   . Unspecified hypothyroidism   . Anxiety state, unspecified   . Unspecified venous (peripheral) insufficiency   . Obesity   . Sleep apnea     compliant with CPAP  . Presence of permanent cardiac pacemaker   . Lymphedema    Past Surgical History  Procedure Laterality Date  . Pacemaker insertion      SJM by JA  . Cataract extraction w/phaco Left 09/09/2014    Procedure: CATARACT EXTRACTION PHACO AND INTRAOCULAR LENS PLACEMENT (IOC);  Surgeon: Tonny Branch, MD;  Location: [facility];  Service: Ophthalmology;  Laterality: Left;  CDE: 6.99  . Cataract extraction w/phaco Right 10/07/2014    Procedure: CATARACT EXTRACTION PHACO AND INTRAOCULAR LENS PLACEMENT RIGHT EYE;  Surgeon: Tonny Branch, MD;  Location: [facility];  Service: Ophthalmology;  Laterality: Right;  CDE:5.16    Current Outpatient Prescriptions  Medication Sig Dispense Refill  . acetaminophen (TYLENOL) 500 MG tablet Take 500 mg by mouth every 6 (six) hours as needed for mild pain or moderate pain.    Marland Kitchen ALPRAZolam (XANAX) 0.25 MG tablet Take 0.25 mg by mouth at bedtime as needed for anxiety or sleep.     Marland Kitchen atorvastatin (LIPITOR) 40 MG  tablet Take 40 mg by mouth at bedtime.     . clotrimazole-betamethasone (LOTRISONE) cream Apply 1 application topically daily as needed (for affected area(S)).    . diltiazem (CARDIZEM CD) 240 MG 24 hr capsule Take 240 mg by mouth daily.    Marland Kitchen esomeprazole (NEXIUM) 40 MG capsule Take 40 mg by mouth daily as needed (acid reflux).     Marland Kitchen levothyroxine (SYNTHROID, LEVOTHROID) 150 MCG tablet Take 150 mcg by mouth every morning.     . metoprolol (LOPRESSOR) 100 MG tablet TAKE ONE-HALF TABLET BY MOUTH TWICE DAILY 30 tablet 6  . nystatin cream (MYCOSTATIN) Apply 1 application topically 2 (two) times daily. To affected areas 30 g 0  . Vitamin D, Ergocalciferol, (DRISDOL) 50000 units CAPS capsule Take 50,000 Units by mouth every 7 (seven) days. Takes on Thursdays.    Marland Kitchen warfarin (COUMADIN) 3 MG tablet Take 1 1/2 tablets daily except 1 tablet on Mondays, Wednesdays and Fridays (Patient taking differently: Take 3-4.5 mg by mouth See admin instructions. Take 1 1/2 tablets (4.5 mg) on daily, except take 1 tablet (3 mg) on Monday, Wednesday and Friday.) 45 tablet 4   No current facility-administered medications for this visit.    Physical Exam: Filed Vitals:   09/03/15 1430  BP: 120/82  Pulse: 52  Height: 5\' 1"  (1.549 m)  Weight: 283 lb (128.368 kg)  SpO2: 97%    GEN- The patient is overweight appearing, alert  and oriented x 3 today.   Head- normocephalic, atraumatic Eyes-  Sclera clear, conjunctiva pink Ears- hearing intact Oropharynx- clear Lungs- Clear to ausculation bilaterally, normal work of breathing Chest- pacemaker pocket is well healed Heart- irregular rate and rhythm  GI- soft, NT, ND, + BS Extremities- no clubbing, cyanosis, + chronic edema  Pacemaker interrogation- reviewed in detail today,  See PACEART report  Assessment and Plan:  1. Permanent afib Stable Continue long term anticoagulation  2. Tachy/brady Normal pacemaker function See Pace Art report No changes today  3.  Obesity Weight loss is strongly advised She has made no progress in the years that I have known her  4. HTN Stable No change required today  She declines remote monitoring Return in 1 year  Thompson Grayer MD, [facility] 09/03/2015 3:13 PM

## 2015-09-03 NOTE — Patient Instructions (Signed)
Your physician recommends that you continue on your current medications as directed. Please refer to the Current Medication list given to you today. Your physician recommends that you schedule a follow-up appointment in: 1 year with Dr. Rayann Heman. Please schedule this appointment today before leaving the office.

## 2015-09-04 ENCOUNTER — Ambulatory Visit (HOSPITAL_COMMUNITY): Payer: Medicare Other | Attending: Physician Assistant | Admitting: Physical Therapy

## 2015-09-04 DIAGNOSIS — R262 Difficulty in walking, not elsewhere classified: Secondary | ICD-10-CM | POA: Insufficient documentation

## 2015-09-04 DIAGNOSIS — M79605 Pain in left leg: Secondary | ICD-10-CM | POA: Diagnosis not present

## 2015-09-04 DIAGNOSIS — I890 Lymphedema, not elsewhere classified: Secondary | ICD-10-CM | POA: Insufficient documentation

## 2015-09-04 NOTE — Therapy (Addendum)
[place], [place], [address] Phone: [phone]   Fax:  [phone]  Physical Therapy Evaluation  Patient Details  Name: Megan [name] MRN: [id number] Date of Birth: [birth year] Referring Provider: Dr. Terald Sleeper  Encounter Date: 09/04/2015      PT End of Session - 09/04/15 1204    Visit Number 1   Number of Visits 12   Date for PT Re-Evaluation 10/04/15   Authorization Type [employer]    PT Start Time 1035   PT Stop Time 1150   PT Time Calculation (min) 75 min   Activity Tolerance Patient tolerated treatment well   Behavior During Therapy [facility] for tasks assessed/performed      Past Medical History  Diagnosis Date  . Hypertension   . Hyperlipidemia   . GERD (gastroesophageal reflux disease)   . Persistent atrial fibrillation (HCC)      post-termination pauses with afib s/p PPM  . Degenerative joint disease   . Acute diastolic heart failure ([place])   . Sinoatrial node dysfunction (HCC)     s/p PPM  . Congestive heart failure, unspecified   . Unspecified hypothyroidism   . Anxiety state, unspecified   . Unspecified venous (peripheral) insufficiency   . Obesity   . Sleep apnea     compliant with CPAP  . Presence of permanent cardiac pacemaker   . Lymphedema     Past Surgical History  Procedure Laterality Date  . Pacemaker insertion      SJM by JA  . Cataract extraction w/phaco Left 09/09/2014    Procedure: CATARACT EXTRACTION PHACO AND INTRAOCULAR LENS PLACEMENT (IOC);  Surgeon: Tonny Branch, MD;  Location: [facility];  Service: Ophthalmology;  Laterality: Left;  CDE: 6.99  . Cataract extraction w/phaco Right 10/07/2014    Procedure: CATARACT EXTRACTION PHACO AND INTRAOCULAR LENS PLACEMENT RIGHT EYE;  Surgeon: Tonny Branch, MD;  Location: [facility];  Service: Ophthalmology;  Laterality: Right;  CDE:5.16    There were no vitals filed for this visit.       Subjective Assessment - 09/04/15 1028    Subjective Ms. Sheridan states that she was diagnosed with lymphedema years ago.  She has had previous treatments which have been successful and had been wearing her juxtafit but she has had them for three years.  She states her Lt leg began to leak in early May and progressed to having pain and redness therefore she went to the ER.   She was admitted into the hospital on due to cellulitis in her Lt LE May 21st and discharged on May 24th recieving IV antibiotics.     Pertinent History pacemaker, Lymphedema,    How long can you sit comfortably? no problem   How long can you stand comfortably? less than five minutes    How long can you walk comfortably? Pt uses a cane and is able to walk short distances only, less than two minutes.  Pt walks slowly    Patient Stated Goals less swelling, decrease pain, improved ambulation    Currently in Pain? Yes   Pain Score 4    Pain Location Leg   Pain Orientation Left   Pain Descriptors / Indicators Aching;Tightness;Throbbing;Tingling   Pain Type Chronic pain   Pain Onset More than a month ago   Pain Frequency Intermittent   Aggravating Factors  walking    Pain Relieving Factors putting legs up    Multiple Pain Sites No  [facility] PT Assessment - 09/04/15 0001    Assessment   Medical Diagnosis B lymphedema    Referring Provider Dr. Terald Sleeper   Onset Date/Surgical Date 07/28/15   Hand Dominance Right   Next MD Visit not scheduled   Prior Therapy not for this episode    Precautions   Precautions None   Restrictions   Weight Bearing Restrictions No   Balance Screen   Has the patient fallen in the past 6 months No   Has the patient had a decrease in activity level because of a fear of falling?  Yes   Is the patient reluctant to leave their home because of a fear of falling?  Yes   Home Environment   Living Environment Private residence   Home Access Stairs to enter   Entrance Stairs-Number of Steps 5    Entrance Stairs-Rails Right    Additional Comments wt 282#   Prior Function   Level of Independence Independent with basic ADLs   Vocation Retired   Leisure none    Observation/Other Assessments   Other Surveys  --  lymphedema impact score 53    Observation/Other Assessments-Edema    Edema Circumferential           LYMPHEDEMA/ONCOLOGY QUESTIONNAIRE - 09/04/15 1049    Date Lymphedema/Swelling Started   Date 05/27/12   What other symptoms do you have   Are you Having Heaviness or Tightness Yes   Are you having Pain Yes   Are you having pitting edema Yes   Body Site Lt LE   Is it Hard or Difficult finding clothes that fit Yes   Do you have infections No  was hospitalized in May   [place] Sign Yes   Lymphedema Stage   Stage STAGE 3 ELEPHANTIASIS   Lymphedema Assessments   Lymphedema Assessments Lower extremities   Right Lower Extremity Lymphedema   20 cm Proximal to Suprapatella 78.2 cm   10 cm Proximal to Suprapatella 71.3 cm   At Midpatella/Popliteal Crease 53.6 cm   30 cm Proximal to Floor at Lateral Plantar Foot 51.2 cm   20 cm Proximal to Floor at Lateral Plantar Foot 40.8 1   10  cm Proximal to Floor at Lateral Malleoli 31.9 cm   Circumference of ankle/heel 35.7 cm.   5 cm Proximal to 1st MTP Joint 25 cm   Across MTP Joint 23.9 cm   Left Lower Extremity Lymphedema   20 cm Proximal to Suprapatella 76.3 cm   10 cm Proximal to Suprapatella 74.9 cm   At Midpatella/Popliteal Crease 62 cm   30 cm Proximal to Floor at Lateral Plantar Foot 54.5 cm   20 cm Proximal to Floor at Lateral Plantar Foot 46 cm   10 cm Proximal to Floor at Lateral Malleoli 37.6 cm   Circumference of ankle/heel 37.6 cm.   5 cm Proximal to 1st MTP Joint 27.7 cm   Across MTP Joint 25.3 cm                [facility] Adult PT Treatment/Exercise - 09/04/15 0001    Manual Therapy   Manual Therapy Compression Bandaging;Manual Lymphatic Drainage (MLD)   Manual Lymphatic Drainage (MLD) supraclavicular, deep and superfical  abdominal, Routing fluid using Lt inguinal/axillary anastomosis as well as Lt LE.    Compression Bandaging cleansed LE followed by moisturizing and donning of B juxtafit                 PT Education - 09/04/15 1202  Education provided Yes   Education Details exercises to improve lymph circulation, Tip sheet for lymphedema.    Person(s) Educated Patient   Methods Explanation;Handout;Demonstration   Comprehension Verbalized understanding          PT Short Term Goals - 09/04/15 1231    PT SHORT TERM GOAL #1   Title Pt pain in her Lt LE to be no greater than a 3/10 to allow pt to be able to walk for five to ten minutes    Time 2   Period Weeks   Status New   PT SHORT TERM GOAL #2   Title Pt to be able to verbalize the signs an symptoms of cellulitis and the importance of seeking medical assistance immediately to prevent the need for hosipitalization    Time 2   Period Weeks   Status New   PT SHORT TERM GOAL #3   Title Pt volume in her Lt LE to be decreased by 3 cm to allow improved fitting of clothes   PT SHORT TERM GOAL #4   Title Pt to have decreased volume in her Lt LE  to allow pt to be able to lift her leg onto the couch/bed with no difficulty.   Time 2   Period Weeks   Status New           PT Long Term Goals - 09/04/15 1232    PT LONG TERM GOAL #1   Title Pt to have acquired and be using the compression pump to reduce risk of  cellulitis in the future.    Time 4   Period Weeks   Status New   PT LONG TERM GOAL #2   Title PT to have obtained and to be able to don and doff new juxtafit and capris to be able to enter maintance phase to control lymphedema   Time 4   Period Weeks   Status New   PT LONG TERM GOAL #3   Title Pt volume in her Lt LE  to decrease by 5 cm to allow ease of donning and doffing clothes    Time 4   Period Weeks   Status New   PT LONG TERM GOAL #4   Title Pain in Lt Le to be 0/10 to allow pt to be able to ambulate 10-15 minutes at  a time to assist in weight loss   Time 4   Period Weeks   Status New               Plan - 09/04/15 1207    Clinical Impression Statement Pt is a 70 yo female who was diagnosed with bilateral LE lymphedma in 2014.  She has been compliant wearing her juxtafit but has not obtained new garments since 2014.  Both of her legs edema increased in early May.  By the 21st of May  her legs were weeping , hot, and painful therefore she went to the ER and was admitted for cellulitis.  She is now being referred to skilled physical therapy for total decongestive techniques.  The pt states she still has her short stretch bandages but did not bring them with her on this visit.  Examination demonstrates bilateral edema with left being greater than the right. The Left LE is still somewhat red and painful.  Ms. Krauskopf will benefit from decongestive techniques to decrease her edema, new thigh high juxtafit, (pt has knee high at this time), and a compression pump to decrease the risk of  cellulitis.       Rehab Potential Good   PT Frequency 3x / week   PT Duration 4 weeks   PT Treatment/Interventions Manual lymph drainage;Compression bandaging;Patient/family education;ADLs/Self Care Home Management   PT Next Visit Plan Begin compression bandaging if pt brings her short stretch bandages .   PT Home Exercise Plan given   Consulted and Agree with Plan of Care Patient      Patient will benefit from skilled therapeutic intervention in order to improve the following deficits and impairments:  Difficulty walking, Pain, Obesity, Increased edema, Decreased strength, Decreased mobility  Visit Diagnosis: Lymphedema, not elsewhere classified - Plan: PT plan of care cert/re-cert  Difficulty in walking, not elsewhere classified - Plan: PT plan of care cert/re-cert  Pain In Left Leg - Plan: PT plan of care cert/re-cert  0000000 CL [id number] CJ   Problem List Patient Active Problem List   Diagnosis Date Noted  . Sepsis  due to cellulitis ([place]) 08/20/2015  . Weakness generalized 08/17/2015  . Leukocytosis 08/17/2015  . Cellulitis 08/17/2015  . Pyrexia   . Super obesity ([place]) 07/31/2015  . Sleeps in sitting position due to orthopnea 07/31/2015  . Acute on chronic systolic congestive heart failure ([place]) 06/09/2015  . Morbid obesity due to excess calories ([place]) 06/09/2015  . PVD (peripheral vascular disease) with claudication ([place]) 06/09/2015  . Sleep related headaches 06/09/2015  . OSA (obstructive sleep apnea) 06/09/2015  . Encounter for therapeutic drug monitoring 04/24/2013  . Lymphedema of left leg 02/13/2013  . Sleep apnea 08/18/2011  . Obesity 08/18/2011  . Long term (current) use of anticoagulants 06/19/2010  . OTHER NONSPECIFIC FINDING EXAMINATION OF URINE 02/10/2009  . Tachycardia-bradycardia syndrome ([place]) 11/18/2008  . PPM-St.Jude 11/18/2008  . HYPERLIPIDEMIA-MIXED 02/21/2008  . HYPERTENSION, BENIGN 02/21/2008  . ATRIAL FIBRILLATION 02/21/2008   Rayetta Humphrey, PT CLT [phone] 09/04/2015, 12:33 PM  [facility] [address] [place], [place], [address] Phone: [phone]   Fax:  [phone]  Name: SARABELLE BELLAVANCE MRN: [id number] Date of Birth: 1945/04/22

## 2015-09-08 ENCOUNTER — Ambulatory Visit (HOSPITAL_COMMUNITY): Payer: Medicare Other | Admitting: Physical Therapy

## 2015-09-08 DIAGNOSIS — M79605 Pain in left leg: Secondary | ICD-10-CM | POA: Diagnosis not present

## 2015-09-08 DIAGNOSIS — I890 Lymphedema, not elsewhere classified: Secondary | ICD-10-CM

## 2015-09-08 DIAGNOSIS — R262 Difficulty in walking, not elsewhere classified: Secondary | ICD-10-CM

## 2015-09-08 NOTE — Therapy (Signed)
[place], [place], [address] Phone: [phone]   Fax:  [phone]  Physical Therapy Treatment  Patient Details  Name: Megan [name] MRN: [id number] Date of Birth: 10-19-1945 Referring Provider: Dr. Terald Sleeper  Encounter Date: 09/08/2015      PT End of Session - 09/08/15 1510    Visit Number 2   Number of Visits 12   Date for PT Re-Evaluation 10/04/15   Authorization Type Medicare A and B   Authorization - Visit Number 2   Authorization - Number of Visits 10   PT Start Time 1120   PT Stop Time 1230   PT Time Calculation (min) 70 min   Activity Tolerance Patient tolerated treatment well   Behavior During Therapy [facility] for tasks assessed/performed      Past Medical History  Diagnosis Date  . Hypertension   . Hyperlipidemia   . GERD (gastroesophageal reflux disease)   . Persistent atrial fibrillation (HCC)      post-termination pauses with afib s/p PPM  . Degenerative joint disease   . Acute diastolic heart failure ([place])   . Sinoatrial node dysfunction (HCC)     s/p PPM  . Congestive heart failure, unspecified   . Unspecified hypothyroidism   . Anxiety state, unspecified   . Unspecified venous (peripheral) insufficiency   . Obesity   . Sleep apnea     compliant with CPAP  . Presence of permanent cardiac pacemaker   . Lymphedema     Past Surgical History  Procedure Laterality Date  . Pacemaker insertion      SJM by JA  . Cataract extraction w/phaco Left 09/09/2014    Procedure: CATARACT EXTRACTION PHACO AND INTRAOCULAR LENS PLACEMENT (IOC);  Surgeon: Tonny Branch, MD;  Location: [facility];  Service: Ophthalmology;  Laterality: Left;  CDE: 6.99  . Cataract extraction w/phaco Right 10/07/2014    Procedure: CATARACT EXTRACTION PHACO AND INTRAOCULAR LENS PLACEMENT RIGHT EYE;  Surgeon: Tonny Branch, MD;  Location: [facility];  Service: Ophthalmology;  Laterality: Right;  CDE:5.16    There were no vitals  filed for this visit.      Subjective Assessment - 09/08/15 1507    Subjective Pt comes today with her bandages and 2 pieces of foam.  No compression on either LE today.   Currently in Pain? No/denies                         [facility] Adult PT Treatment/Exercise - 09/08/15 1508    Manual Therapy   Manual Therapy Compression Bandaging;Manual Lymphatic Drainage (MLD)   Manual Lymphatic Drainage (MLD) supraclavicular, deep and superfical abdominal, Routing fluid using Lt inguinal/axillary anastomosis as well as Lt LE.    Compression Bandaging cleansed LE followed by moisturizing and donning of Rt juxtafit and compression bandaging including 1/2" foam and short stretch to Lt LE to thigh level                 PT Education - 09/08/15 1509    Education provided Yes   Education Details Lymphedema and compression.  Need to replace with new garment every 6 months.  Places to get shoes that will fit over bandages (Crocs)   Person(s) Educated Patient   Methods Explanation   Comprehension Verbalized understanding          PT Short Term Goals - 09/04/15 1231    PT SHORT TERM GOAL #1   Title  Pt pain in her Lt LE to be no greater than a 3/10 to allow pt to be able to walk for five to ten minutes    Time 2   Period Weeks   Status New   PT SHORT TERM GOAL #2   Title Pt to be able to verbalize the signs an symptoms of cellulitis and the importance of seeking medical assistance immediately to prevent the need for hosipitalization    Time 2   Period Weeks   Status New   PT SHORT TERM GOAL #3   Title Pt volume in her Lt LE to be decreased by 3 cm to allow improved fitting of clothes   PT SHORT TERM GOAL #4   Title Pt to have decreased volume in her Lt LE  to allow pt to be able to lift her leg onto the couch/bed with no difficulty.   Time 2   Period Weeks   Status New           PT Long Term Goals - 09/04/15 1232    PT LONG TERM GOAL #1   Title Pt to have acquired  and be using the compression pump to reduce risk of  cellulitis in the future.    Time 4   Period Weeks   Status New   PT LONG TERM GOAL #2   Title PT to have obtained and to be able to don and doff new juxtafit and capris to be able to enter maintance phase to control lymphedema   Time 4   Period Weeks   Status New   PT LONG TERM GOAL #3   Title Pt volume in her Lt LE  to decrease by 5 cm to allow ease of donning and doffing clothes    Time 4   Period Weeks   Status New   PT LONG TERM GOAL #4   Title Pain in Lt Le to be 0/10 to allow pt to be able to ambulate 10-15 minutes at a time to assist in weight loss   Time 4   Period Weeks   Status New               Plan - 09/08/15 1511    Clinical Impression Statement Short version of manual lymph completed due to short appointment time and having to cut new foam for upper thigh.  Pt with noted area of swelling on Lt inner thigh superior/medial to knee.  Moisturized LE well and completed bandaging to Lt LE toe to upper thigh.  Juxtafit donned on Rt lower leg.  Pt reported overall compfort.  Suggested patient obtain a croc or other shoe that may fit, explained she may have to go up 1/2 to 1 size.    Rehab Potential Good   PT Frequency 3x / week   PT Duration 4 weeks   PT Treatment/Interventions Manual lymph drainage;Compression bandaging;Patient/family education;ADLs/Self Care Home Management   PT Next Visit Plan Continue manual lymph drainage and compression bandaging to Lt full LE, juxta on Rt LE.   PT Home Exercise Plan given   Consulted and Agree with Plan of Care Patient      Patient will benefit from skilled therapeutic intervention in order to improve the following deficits and impairments:  Difficulty walking, Pain, Obesity, Increased edema, Decreased strength, Decreased mobility  Visit Diagnosis: Lymphedema, not elsewhere classified  Difficulty in walking, not elsewhere classified  Pain In Left Leg     Problem  List Patient Active Problem  List   Diagnosis Date Noted  . Sepsis due to cellulitis ([place]) 08/20/2015  . Weakness generalized 08/17/2015  . Leukocytosis 08/17/2015  . Cellulitis 08/17/2015  . Pyrexia   . Super obesity ([place]) 07/31/2015  . Sleeps in sitting position due to orthopnea 07/31/2015  . Acute on chronic systolic congestive heart failure ([place]) 06/09/2015  . Morbid obesity due to excess calories ([place]) 06/09/2015  . PVD (peripheral vascular disease) with claudication ([place]) 06/09/2015  . Sleep related headaches 06/09/2015  . OSA (obstructive sleep apnea) 06/09/2015  . Encounter for therapeutic drug monitoring 04/24/2013  . Lymphedema of left leg 02/13/2013  . Sleep apnea 08/18/2011  . Obesity 08/18/2011  . Long term (current) use of anticoagulants 06/19/2010  . OTHER NONSPECIFIC FINDING EXAMINATION OF URINE 02/10/2009  . Tachycardia-bradycardia syndrome ([place]) 11/18/2008  . PPM-St.Jude 11/18/2008  . HYPERLIPIDEMIA-MIXED 02/21/2008  . HYPERTENSION, BENIGN 02/21/2008  . ATRIAL FIBRILLATION 02/21/2008    Teena Irani, PTA/CLT [phone]  09/08/2015, 3:14 PM  [facility] [address] [place], [place], [address] Phone: [phone]   Fax:  [phone]  Name: Megan [name] MRN: [id number] Date of Birth: 09-07-1945

## 2015-09-11 ENCOUNTER — Ambulatory Visit (INDEPENDENT_AMBULATORY_CARE_PROVIDER_SITE_OTHER): Payer: Medicare Other | Admitting: *Deleted

## 2015-09-11 ENCOUNTER — Ambulatory Visit (HOSPITAL_COMMUNITY): Payer: Medicare Other | Admitting: Physical Therapy

## 2015-09-11 DIAGNOSIS — Z5181 Encounter for therapeutic drug level monitoring: Secondary | ICD-10-CM

## 2015-09-11 DIAGNOSIS — I890 Lymphedema, not elsewhere classified: Secondary | ICD-10-CM | POA: Diagnosis not present

## 2015-09-11 DIAGNOSIS — I4891 Unspecified atrial fibrillation: Secondary | ICD-10-CM | POA: Diagnosis not present

## 2015-09-11 DIAGNOSIS — R262 Difficulty in walking, not elsewhere classified: Secondary | ICD-10-CM

## 2015-09-11 DIAGNOSIS — M79605 Pain in left leg: Secondary | ICD-10-CM | POA: Diagnosis not present

## 2015-09-11 LAB — POCT INR: INR: 2.9

## 2015-09-11 NOTE — Therapy (Signed)
[place], [place], [address] Phone: [phone]   Fax:  [phone]  Physical Therapy Treatment  Patient Details  Name: Megan [name] MRN: [id number] Date of Birth: 05-Oct-1945 Referring Provider: Dr. Terald Sleeper  Encounter Date: 09/11/2015      PT End of Session - 09/11/15 1756    Visit Number 3   Number of Visits 12   Date for PT Re-Evaluation 10/04/15   Authorization Type Medicare A and B   Authorization - Visit Number 3   Authorization - Number of Visits 10   PT Start Time 1604   PT Stop Time 1725   PT Time Calculation (min) 81 min   Activity Tolerance Patient tolerated treatment well   Behavior During Therapy [facility] for tasks assessed/performed      Past Medical History  Diagnosis Date  . Hypertension   . Hyperlipidemia   . GERD (gastroesophageal reflux disease)   . Persistent atrial fibrillation (HCC)      post-termination pauses with afib s/p PPM  . Degenerative joint disease   . Acute diastolic heart failure ([place])   . Sinoatrial node dysfunction (HCC)     s/p PPM  . Congestive heart failure, unspecified   . Unspecified hypothyroidism   . Anxiety state, unspecified   . Unspecified venous (peripheral) insufficiency   . Obesity   . Sleep apnea     compliant with CPAP  . Presence of permanent cardiac pacemaker   . Lymphedema     Past Surgical History  Procedure Laterality Date  . Pacemaker insertion      SJM by JA  . Cataract extraction w/phaco Left 09/09/2014    Procedure: CATARACT EXTRACTION PHACO AND INTRAOCULAR LENS PLACEMENT (IOC);  Surgeon: Tonny Branch, MD;  Location: [facility];  Service: Ophthalmology;  Laterality: Left;  CDE: 6.99  . Cataract extraction w/phaco Right 10/07/2014    Procedure: CATARACT EXTRACTION PHACO AND INTRAOCULAR LENS PLACEMENT RIGHT EYE;  Surgeon: Tonny Branch, MD;  Location: [facility];  Service: Ophthalmology;  Laterality: Right;  CDE:5.16    There were no vitals  filed for this visit.      Subjective Assessment - 09/11/15 1753    Subjective Pt comes today with her short stretch intact on Lt LE and juxtafit on Rt LE.  Pt states she voided all over her bandages on Lt.  Pt comes with her sleeve for her pump today.   Currently in Pain? No/denies                         [facility] Adult PT Treatment/Exercise - 09/11/15 1754    Manual Therapy   Manual Therapy Compression Bandaging;Manual Lymphatic Drainage (MLD)   Manual Lymphatic Drainage (MLD) supraclavicular, deep and superfical abdominal, Routing fluid using Lt inguinal/axillary anastomosis as well as Lt LE.    Compression Bandaging cleansed LE followed by moisturizing and donning of Rt juxtafit and compression bandaging including 1/2" foam and short stretch to Lt LE to knee level                PT Education - 09/11/15 1754    Education provided Yes   Education Details Instructed with use of compression pump.  Instructed to only use on Rt LE at this time and to remove her juxta prior to 30 minute session  Pt reported overall comfort and understands how to use machine.   Pt with questions regarding garments as well.  Person(s) Educated Patient   Methods Explanation;Demonstration   Comprehension Verbalized understanding;Returned demonstration          PT Short Term Goals - 09/04/15 1231    PT SHORT TERM GOAL #1   Title Pt pain in her Lt LE to be no greater than a 3/10 to allow pt to be able to walk for five to ten minutes    Time 2   Period Weeks   Status New   PT SHORT TERM GOAL #2   Title Pt to be able to verbalize the signs an symptoms of cellulitis and the importance of seeking medical assistance immediately to prevent the need for hosipitalization    Time 2   Period Weeks   Status New   PT SHORT TERM GOAL #3   Title Pt volume in her Lt LE to be decreased by 3 cm to allow improved fitting of clothes   PT SHORT TERM GOAL #4   Title Pt to have decreased volume in  her Lt LE  to allow pt to be able to lift her leg onto the couch/bed with no difficulty.   Time 2   Period Weeks   Status New           PT Long Term Goals - 09/04/15 1232    PT LONG TERM GOAL #1   Title Pt to have acquired and be using the compression pump to reduce risk of  cellulitis in the future.    Time 4   Period Weeks   Status New   PT LONG TERM GOAL #2   Title PT to have obtained and to be able to don and doff new juxtafit and capris to be able to enter maintance phase to control lymphedema   Time 4   Period Weeks   Status New   PT LONG TERM GOAL #3   Title Pt volume in her Lt LE  to decrease by 5 cm to allow ease of donning and doffing clothes    Time 4   Period Weeks   Status New   PT LONG TERM GOAL #4   Title Pain in Lt Le to be 0/10 to allow pt to be able to ambulate 10-15 minutes at a time to assist in weight loss   Time 4   Period Weeks   Status New               Plan - 09/11/15 1756    Clinical Impression Statement Completed manual for Lt LE this session and shown how to use pump for Rt LE.  Pt still has chapping in bilateral groin areas and suggested using cotton to help decrease moisture.  Also discussed several garment options besides thigh high juxtas, such as capris that may be more comfortable for patient.  [place] will be here on 6/26 and will have them discuss with patient at that time.  Unable to complete thigh high wrap today on Lt due to all bandages soiled and only had 5 available.  Overall reduction in LE's noted.  Pt instructed to wash bandages and return next session.  Pt is also going to look for shoes this afternoon.   Rehab Potential Good   PT Frequency 3x / week   PT Duration 4 weeks   PT Treatment/Interventions Manual lymph drainage;Compression bandaging;Patient/family education;ADLs/Self Care Home Management   PT Next Visit Plan Continue manual lymph drainage and compression bandaging to Lt full LE, juxta on Rt LE.   PT Home  Exercise Plan given   Consulted and Agree with Plan of Care Patient      Patient will benefit from skilled therapeutic intervention in order to improve the following deficits and impairments:  Difficulty walking, Pain, Obesity, Increased edema, Decreased strength, Decreased mobility  Visit Diagnosis: Lymphedema, not elsewhere classified  Difficulty in walking, not elsewhere classified  Pain In Left Leg     Problem List Patient Active Problem List   Diagnosis Date Noted  . Sepsis due to cellulitis ([place]) 08/20/2015  . Weakness generalized 08/17/2015  . Leukocytosis 08/17/2015  . Cellulitis 08/17/2015  . Pyrexia   . Super obesity ([place]) 07/31/2015  . Sleeps in sitting position due to orthopnea 07/31/2015  . Acute on chronic systolic congestive heart failure ([place]) 06/09/2015  . Morbid obesity due to excess calories ([place]) 06/09/2015  . PVD (peripheral vascular disease) with claudication ([place]) 06/09/2015  . Sleep related headaches 06/09/2015  . OSA (obstructive sleep apnea) 06/09/2015  . Encounter for therapeutic drug monitoring 04/24/2013  . Lymphedema of left leg 02/13/2013  . Sleep apnea 08/18/2011  . Obesity 08/18/2011  . Long term (current) use of anticoagulants 06/19/2010  . OTHER NONSPECIFIC FINDING EXAMINATION OF URINE 02/10/2009  . Tachycardia-bradycardia syndrome ([place]) 11/18/2008  . PPM-St.Jude 11/18/2008  . HYPERLIPIDEMIA-MIXED 02/21/2008  . HYPERTENSION, BENIGN 02/21/2008  . ATRIAL FIBRILLATION 02/21/2008   Teena Irani, PTA/CLT [phone]  09/11/2015, 6:02 PM  [place] [address] [place], [place], [address] Phone: [phone]   Fax:  [phone]  Name: KLEA MARSLAND MRN: [id number] Date of Birth: 06/02/1945

## 2015-09-15 ENCOUNTER — Ambulatory Visit (HOSPITAL_COMMUNITY): Payer: Medicare Other | Admitting: Physical Therapy

## 2015-09-15 DIAGNOSIS — M79605 Pain in left leg: Secondary | ICD-10-CM | POA: Diagnosis not present

## 2015-09-15 DIAGNOSIS — R262 Difficulty in walking, not elsewhere classified: Secondary | ICD-10-CM | POA: Diagnosis not present

## 2015-09-15 DIAGNOSIS — I890 Lymphedema, not elsewhere classified: Secondary | ICD-10-CM | POA: Diagnosis not present

## 2015-09-15 NOTE — Therapy (Signed)
[place], [place], [address] Phone: [phone]   Fax:  [phone]  Physical Therapy Treatment  Patient Details  Name: Megan [name] MRN: [id number] Date of Birth: [birth year] Referring Provider: Dr. Terald Sleeper  Encounter Date: 09/15/2015      PT End of Session - 09/15/15 1750    Visit Number 4   Number of Visits 12   Date for PT Re-Evaluation 10/04/15   Authorization Type Medicare A and B   Authorization - Visit Number 4   Authorization - Number of Visits 10   PT Start Time [id number]   PT Stop Time 1750   PT Time Calculation (min) 60 min   Activity Tolerance Patient tolerated treatment well   Behavior During Therapy [facility] for tasks assessed/performed      Past Medical History  Diagnosis Date  . Hypertension   . Hyperlipidemia   . GERD (gastroesophageal reflux disease)   . Persistent atrial fibrillation (HCC)      post-termination pauses with afib s/p PPM  . Degenerative joint disease   . Acute diastolic heart failure ([place])   . Sinoatrial node dysfunction (HCC)     s/p PPM  . Congestive heart failure, unspecified   . Unspecified hypothyroidism   . Anxiety state, unspecified   . Unspecified venous (peripheral) insufficiency   . Obesity   . Sleep apnea     compliant with CPAP  . Presence of permanent cardiac pacemaker   . Lymphedema     Past Surgical History  Procedure Laterality Date  . Pacemaker insertion      SJM by JA  . Cataract extraction w/phaco Left 09/09/2014    Procedure: CATARACT EXTRACTION PHACO AND INTRAOCULAR LENS PLACEMENT (IOC);  Surgeon: Tonny Branch, MD;  Location: [facility];  Service: Ophthalmology;  Laterality: Left;  CDE: 6.99  . Cataract extraction w/phaco Right 10/07/2014    Procedure: CATARACT EXTRACTION PHACO AND INTRAOCULAR LENS PLACEMENT RIGHT EYE;  Surgeon: Tonny Branch, MD;  Location: [facility];  Service: Ophthalmology;  Laterality: Right;  CDE:5.16    There were no vitals  filed for this visit.      Subjective Assessment - 09/15/15 1749    Subjective Pt states her legs are feeling better.  States she still has not goteen any shoes yet.   Currently in Pain? No/denies                         [facility] Adult PT Treatment/Exercise - 09/15/15 1750    Manual Therapy   Manual Therapy Compression Bandaging   Compression Bandaging cleansed LE followed by moisturizing and donning of Rt juxtafit and compression bandaging including 1/2" foam and short stretch to Lt LE to knee level                  PT Short Term Goals - 09/04/15 1231    PT SHORT TERM GOAL #1   Title Pt pain in her Lt LE to be no greater than a 3/10 to allow pt to be able to walk for five to ten minutes    Time 2   Period Weeks   Status New   PT SHORT TERM GOAL #2   Title Pt to be able to verbalize the signs an symptoms of cellulitis and the importance of seeking medical assistance immediately to prevent the need for hosipitalization    Time 2   Period Weeks   Status New  PT SHORT TERM GOAL #3   Title Pt volume in her Lt LE to be decreased by 3 cm to allow improved fitting of clothes   PT SHORT TERM GOAL #4   Title Pt to have decreased volume in her Lt LE  to allow pt to be able to lift her leg onto the couch/bed with no difficulty.   Time 2   Period Weeks   Status New           PT Long Term Goals - 09/04/15 1232    PT LONG TERM GOAL #1   Title Pt to have acquired and be using the compression pump to reduce risk of  cellulitis in the future.    Time 4   Period Weeks   Status New   PT LONG TERM GOAL #2   Title PT to have obtained and to be able to don and doff new juxtafit and capris to be able to enter maintance phase to control lymphedema   Time 4   Period Weeks   Status New   PT LONG TERM GOAL #3   Title Pt volume in her Lt LE  to decrease by 5 cm to allow ease of donning and doffing clothes    Time 4   Period Weeks   Status New   PT LONG TERM GOAL  #4   Title Pain in Lt Le to be 0/10 to allow pt to be able to ambulate 10-15 minutes at a time to assist in weight loss   Time 4   Period Weeks   Status New               Plan - 09/15/15 1751    Clinical Impression Statement Unable to complete manual lymph drainage this session due to short time slot.  Cleansed and moisturized LE's after removing bandages.  New foam cut for Lt thigh and completed bandaging to thigh level on Lt LE, juxtafit on distal Rt LE.  Pt reported overall comfort.    Rehab Potential Good   PT Frequency 3x / week   PT Duration 4 weeks   PT Treatment/Interventions Manual lymph drainage;Compression bandaging;Patient/family education;ADLs/Self Care Home Management   PT Next Visit Plan Continue manual lymph drainage and compression bandaging to Lt full LE, juxta on Rt LE.  Remeasure next session.   PT Home Exercise Plan given   Consulted and Agree with Plan of Care Patient      Patient will benefit from skilled therapeutic intervention in order to improve the following deficits and impairments:  Difficulty walking, Pain, Obesity, Increased edema, Decreased strength, Decreased mobility  Visit Diagnosis: Lymphedema, not elsewhere classified  Difficulty in walking, not elsewhere classified  Pain In Left Leg     Problem List Patient Active Problem List   Diagnosis Date Noted  . Sepsis due to cellulitis ([place]) 08/20/2015  . Weakness generalized 08/17/2015  . Leukocytosis 08/17/2015  . Cellulitis 08/17/2015  . Pyrexia   . Super obesity ([place]) 07/31/2015  . Sleeps in sitting position due to orthopnea 07/31/2015  . Acute on chronic systolic congestive heart failure ([place]) 06/09/2015  . Morbid obesity due to excess calories ([place]) 06/09/2015  . PVD (peripheral vascular disease) with claudication ([place]) 06/09/2015  . Sleep related headaches 06/09/2015  . OSA (obstructive sleep apnea) 06/09/2015  . Encounter for therapeutic drug monitoring 04/24/2013  .  Lymphedema of left leg 02/13/2013  . Sleep apnea 08/18/2011  . Obesity 08/18/2011  . Long term (current) use of  anticoagulants 06/19/2010  . OTHER NONSPECIFIC FINDING EXAMINATION OF URINE 02/10/2009  . Tachycardia-bradycardia syndrome ([place]) 11/18/2008  . PPM-St.Jude 11/18/2008  . HYPERLIPIDEMIA-MIXED 02/21/2008  . HYPERTENSION, BENIGN 02/21/2008  . ATRIAL FIBRILLATION 02/21/2008    Teena Irani, PTA/CLT [phone]  09/15/2015, 5:53 PM  [place] [address] [place], [place], [address] Phone: [phone]   Fax:  [phone]  Name: BELLISSA TSCHIDA MRN: [id number] Date of Birth: 1946-02-18

## 2015-09-17 ENCOUNTER — Ambulatory Visit (HOSPITAL_COMMUNITY): Payer: Medicare Other | Admitting: Physical Therapy

## 2015-09-17 DIAGNOSIS — I890 Lymphedema, not elsewhere classified: Secondary | ICD-10-CM

## 2015-09-17 DIAGNOSIS — M79605 Pain in left leg: Secondary | ICD-10-CM | POA: Diagnosis not present

## 2015-09-17 DIAGNOSIS — R262 Difficulty in walking, not elsewhere classified: Secondary | ICD-10-CM

## 2015-09-17 NOTE — Therapy (Signed)
[place], [place], [address] Phone: [phone]   Fax:  [phone]  Physical Therapy Treatment  Patient Details  Name: Megan [name] MRN: [id number] Date of Birth: 1945-09-28 Referring Provider: Dr. Terald Sleeper  Encounter Date: 09/17/2015      PT End of Session - 09/17/15 1106    Visit Number 5   Number of Visits 12   Date for PT Re-Evaluation 10/04/15   Authorization Type Medicare A and B   Authorization - Visit Number 5   Authorization - Number of Visits 10   PT Start Time [id number]   PT Stop Time 0950   PT Time Calculation (min) 53 min   Activity Tolerance Patient tolerated treatment well   Behavior During Therapy [facility] for tasks assessed/performed      Past Medical History  Diagnosis Date  . Hypertension   . Hyperlipidemia   . GERD (gastroesophageal reflux disease)   . Persistent atrial fibrillation (HCC)      post-termination pauses with afib s/p PPM  . Degenerative joint disease   . Acute diastolic heart failure ([place])   . Sinoatrial node dysfunction (HCC)     s/p PPM  . Congestive heart failure, unspecified   . Unspecified hypothyroidism   . Anxiety state, unspecified   . Unspecified venous (peripheral) insufficiency   . Obesity   . Sleep apnea     compliant with CPAP  . Presence of permanent cardiac pacemaker   . Lymphedema     Past Surgical History  Procedure Laterality Date  . Pacemaker insertion      SJM by JA  . Cataract extraction w/phaco Left 09/09/2014    Procedure: CATARACT EXTRACTION PHACO AND INTRAOCULAR LENS PLACEMENT (IOC);  Surgeon: Tonny Branch, MD;  Location: [facility];  Service: Ophthalmology;  Laterality: Left;  CDE: 6.99  . Cataract extraction w/phaco Right 10/07/2014    Procedure: CATARACT EXTRACTION PHACO AND INTRAOCULAR LENS PLACEMENT RIGHT EYE;  Surgeon: Tonny Branch, MD;  Location: [facility];  Service: Ophthalmology;  Laterality: Right;  CDE:5.16    There were no vitals  filed for this visit.      Subjective Assessment - 09/17/15 1103    Subjective Ms. Gerbers states that the bandages on her right LE seemed a little tight.    Pertinent History pacemaker, Lymphedema,    How long can you sit comfortably? no problem   How long can you stand comfortably? less than five minutes    How long can you walk comfortably? Pt uses a cane and is able to walk short distances only, less than two minutes.  Pt walks slowly    Patient Stated Goals less swelling, decrease pain, improved ambulation    Pain Score 4    Pain Location Leg   Pain Orientation Right   Pain Descriptors / Indicators Aching;Heaviness   Pain Onset More than a month ago                         [facility] Adult PT Treatment/Exercise - 09/17/15 0001    Manual Therapy   Manual Therapy Compression Bandaging;Manual Lymphatic Drainage (MLD)   Manual Lymphatic Drainage (MLD) done for supraclavicular deep and superfical abdominal routing fluid on RT using Rt inguinal/axillary anastomisis.  Short version done due to time restraints.    Compression Bandaging cleansed LE followed by moisturizing and donning of Rt juxtafit and compression bandaging including 1/2" foam and short stretch to  Lt LE to knee level                  PT Short Term Goals - 09/17/15 1109    PT SHORT TERM GOAL #1   Title Pt pain in her Lt LE to be no greater than a 3/10 to allow pt to be able to walk for five to ten minutes    Time 2   Period Weeks   Status On-going   PT SHORT TERM GOAL #2   Title Pt to be able to verbalize the signs an symptoms of cellulitis and the importance of seeking medical assistance immediately to prevent the need for hosipitalization    Time 2   Period Weeks   Status On-going   PT SHORT TERM GOAL #3   Title Pt volume in her Lt LE to be decreased by 3 cm to allow improved fitting of clothes   Status On-going   PT SHORT TERM GOAL #4   Title Pt to have decreased volume in her Lt LE  to  allow pt to be able to lift her leg onto the couch/bed with no difficulty.   Time 2   Period Weeks   Status On-going           PT Long Term Goals - 09/17/15 1110    PT LONG TERM GOAL #1   Title Pt to have acquired and be using the compression pump to reduce risk of  cellulitis in the future.    Time 4   Period Weeks   Status On-going   PT LONG TERM GOAL #2   Title PT to have obtained and to be able to don and doff new juxtafit and capris to be able to enter maintance phase to control lymphedema   Time 4   Period Weeks   Status On-going   PT LONG TERM GOAL #3   Title Pt volume in her Lt LE  to decrease by 5 cm to allow ease of donning and doffing clothes    Time 4   Period Weeks   Status On-going   PT LONG TERM GOAL #4   Title Pain in Lt Le to be 0/10 to allow pt to be able to ambulate 10-15 minutes at a time to assist in weight loss   Time 4   Period Weeks   Status On-going               Plan - 09/17/15 1107    Clinical Impression Statement Only able to complete short version of manual decongestive technique due to time restraints.  Pt was urged to use foot piece for Lt LE juxtafit as pt has swelling on dorsal aspect of foot;(pt states she has it but does not wear it due to it being to tight).  Overall volume of Rt LE decreased    Rehab Potential Good   PT Frequency 3x / week   PT Duration 4 weeks   PT Treatment/Interventions Manual lymph drainage;Compression bandaging;Patient/family education;ADLs/Self Care Home Management   PT Next Visit Plan Remeasure next visit; this was not done this visit due to time restraints.    PT Home Exercise Plan given   Consulted and Agree with Plan of Care Patient      Patient will benefit from skilled therapeutic intervention in order to improve the following deficits and impairments:  Difficulty walking, Pain, Obesity, Increased edema, Decreased strength, Decreased mobility  Visit Diagnosis: Lymphedema, not elsewhere  classified  Difficulty in walking,  not elsewhere classified  Pain In Left Leg     Problem List Patient Active Problem List   Diagnosis Date Noted  . Sepsis due to cellulitis ([place]) 08/20/2015  . Weakness generalized 08/17/2015  . Leukocytosis 08/17/2015  . Cellulitis 08/17/2015  . Pyrexia   . Super obesity ([place]) 07/31/2015  . Sleeps in sitting position due to orthopnea 07/31/2015  . Acute on chronic systolic congestive heart failure ([place]) 06/09/2015  . Morbid obesity due to excess calories ([place]) 06/09/2015  . PVD (peripheral vascular disease) with claudication ([place]) 06/09/2015  . Sleep related headaches 06/09/2015  . OSA (obstructive sleep apnea) 06/09/2015  . Encounter for therapeutic drug monitoring 04/24/2013  . Lymphedema of left leg 02/13/2013  . Sleep apnea 08/18/2011  . Obesity 08/18/2011  . Long term (current) use of anticoagulants 06/19/2010  . OTHER NONSPECIFIC FINDING EXAMINATION OF URINE 02/10/2009  . Tachycardia-bradycardia syndrome ([place]) 11/18/2008  . PPM-St.Jude 11/18/2008  . HYPERLIPIDEMIA-MIXED 02/21/2008  . HYPERTENSION, BENIGN 02/21/2008  . ATRIAL FIBRILLATION 02/21/2008    Rayetta Humphrey, PT CLT [phone] 09/17/2015, 11:11 AM  [place], [place], [address] Phone: [phone]   Fax:  [phone]  Name: Megan [name] MRN: [id number] Date of Birth: [birth year]

## 2015-09-22 ENCOUNTER — Encounter (HOSPITAL_COMMUNITY): Payer: Medicare Other | Admitting: Physical Therapy

## 2015-09-22 ENCOUNTER — Ambulatory Visit (HOSPITAL_COMMUNITY): Payer: Medicare Other | Admitting: Physical Therapy

## 2015-09-22 DIAGNOSIS — R262 Difficulty in walking, not elsewhere classified: Secondary | ICD-10-CM | POA: Diagnosis not present

## 2015-09-22 DIAGNOSIS — I890 Lymphedema, not elsewhere classified: Secondary | ICD-10-CM | POA: Diagnosis not present

## 2015-09-22 DIAGNOSIS — M79605 Pain in left leg: Secondary | ICD-10-CM | POA: Diagnosis not present

## 2015-09-22 NOTE — Therapy (Addendum)
[place], [place], [address] Phone: [phone]   Fax:  [phone]  Physical Therapy Treatment  Patient Details  Name: Megan [name] MRN: [id number] Date of Birth: 02-Feb-1946 Referring Provider: Dr. Terald Sleeper  Encounter Date: 09/22/2015      PT End of Session - 09/22/15 1708    Visit Number 6   Number of Visits 12   Date for PT Re-Evaluation 10/04/15   Authorization Type Medicare A and B   Authorization - Visit Number 6   Authorization - Number of Visits 10   PT Start Time 1300   PT Stop Time 1345   PT Time Calculation (min) 45 min   Activity Tolerance Patient tolerated treatment well   Behavior During Therapy [facility] for tasks assessed/performed      Past Medical History  Diagnosis Date  . Hypertension   . Hyperlipidemia   . GERD (gastroesophageal reflux disease)   . Persistent atrial fibrillation (HCC)      post-termination pauses with afib s/p PPM  . Degenerative joint disease   . Acute diastolic heart failure ([place])   . Sinoatrial node dysfunction (HCC)     s/p PPM  . Congestive heart failure, unspecified   . Unspecified hypothyroidism   . Anxiety state, unspecified   . Unspecified venous (peripheral) insufficiency   . Obesity   . Sleep apnea     compliant with CPAP  . Presence of permanent cardiac pacemaker   . Lymphedema     Past Surgical History  Procedure Laterality Date  . Pacemaker insertion      SJM by JA  . Cataract extraction w/phaco Left 09/09/2014    Procedure: CATARACT EXTRACTION PHACO AND INTRAOCULAR LENS PLACEMENT (IOC);  Surgeon: Tonny Branch, MD;  Location: [facility];  Service: Ophthalmology;  Laterality: Left;  CDE: 6.99  . Cataract extraction w/phaco Right 10/07/2014    Procedure: CATARACT EXTRACTION PHACO AND INTRAOCULAR LENS PLACEMENT RIGHT EYE;  Surgeon: Tonny Branch, MD;  Location: [facility];  Service: Ophthalmology;  Laterality: Right;  CDE:5.16    There were no vitals  filed for this visit.      Subjective Assessment - 09/22/15 1707    Subjective Pt states she is doing well overall.  Removed the upper thigh bandages this morining and rolled them for today.   Currently in Pain? No/denies                         [facility] Adult PT Treatment/Exercise - 09/22/15 0001    Manual Therapy   Manual Therapy Compression Bandaging   Manual Lymphatic Drainage (MLD) No time today due to scheduling   Compression Bandaging cleansed LE followed by moisturizing and donning of Rt juxtafit and compression bandaging including 1/2" foam and short stretch entire Lt LE.                  PT Short Term Goals - 09/17/15 1109    PT SHORT TERM GOAL #1   Title Pt pain in her Lt LE to be no greater than a 3/10 to allow pt to be able to walk for five to ten minutes    Time 2   Period Weeks   Status On-going   PT SHORT TERM GOAL #2   Title Pt to be able to verbalize the signs an symptoms of cellulitis and the importance of seeking medical assistance immediately to prevent the need for hosipitalization  Time 2   Period Weeks   Status On-going   PT SHORT TERM GOAL #3   Title Pt volume in her Lt LE to be decreased by 3 cm to allow improved fitting of clothes   Status On-going   PT SHORT TERM GOAL #4   Title Pt to have decreased volume in her Lt LE  to allow pt to be able to lift her leg onto the couch/bed with no difficulty.   Time 2   Period Weeks   Status On-going           PT Long Term Goals - 09/17/15 1110    PT LONG TERM GOAL #1   Title Pt to have acquired and be using the compression pump to reduce risk of  cellulitis in the future.    Time 4   Period Weeks   Status On-going   PT LONG TERM GOAL #2   Title PT to have obtained and to be able to don and doff new juxtafit and capris to be able to enter maintance phase to control lymphedema   Time 4   Period Weeks   Status On-going   PT LONG TERM GOAL #3   Title Pt volume in her Lt LE   to decrease by 5 cm to allow ease of donning and doffing clothes    Time 4   Period Weeks   Status On-going   PT LONG TERM GOAL #4   Title Pain in Lt Le to be 0/10 to allow pt to be able to ambulate 10-15 minutes at a time to assist in weight loss   Time 4   Period Weeks   Status On-going               Plan - 09/22/15 1709    Clinical Impression Statement Unable to complete manual today due to only 45 minute time scheduled.  Removed Lt distal LE bandages, cleansed and moisturized LE prior to rebandaging.  LE decongesting well overall.  Pt still continues not to use foot piece on Rt juxtafit.   Rehab Potential Good   PT Frequency 3x / week   PT Duration 4 weeks   PT Treatment/Interventions Manual lymph drainage;Compression bandaging;Patient/family education;ADLs/Self Care Home Management   PT Next Visit Plan Remeasure next visit; this was not done this visit due to time restraints.    PT Home Exercise Plan given   Consulted and Agree with Plan of Care Patient      Patient will benefit from skilled therapeutic intervention in order to improve the following deficits and impairments:  Difficulty walking, Pain, Obesity, Increased edema, Decreased strength, Decreased mobility  Visit Diagnosis: Lymphedema, not elsewhere classified  Difficulty in walking, not elsewhere classified  Pain In Left Leg     Problem List Patient Active Problem List   Diagnosis Date Noted  . Sepsis due to cellulitis ([place]) 08/20/2015  . Weakness generalized 08/17/2015  . Leukocytosis 08/17/2015  . Cellulitis 08/17/2015  . Pyrexia   . Super obesity ([place]) 07/31/2015  . Sleeps in sitting position due to orthopnea 07/31/2015  . Acute on chronic systolic congestive heart failure ([place]) 06/09/2015  . Morbid obesity due to excess calories ([place]) 06/09/2015  . PVD (peripheral vascular disease) with claudication ([place]) 06/09/2015  . Sleep related headaches 06/09/2015  . OSA (obstructive sleep apnea)  06/09/2015  . Encounter for therapeutic drug monitoring 04/24/2013  . Lymphedema of left leg 02/13/2013  . Sleep apnea 08/18/2011  . Obesity 08/18/2011  .  Long term (current) use of anticoagulants 06/19/2010  . OTHER NONSPECIFIC FINDING EXAMINATION OF URINE 02/10/2009  . Tachycardia-bradycardia syndrome ([place]) 11/18/2008  . PPM-St.Jude 11/18/2008  . HYPERLIPIDEMIA-MIXED 02/21/2008  . HYPERTENSION, BENIGN 02/21/2008  . ATRIAL FIBRILLATION 02/21/2008    Teena Irani, PTA/CLT [phone]  09/22/2015, 5:11 PM  [facility] [address] [place], [place], [address] Phone: [phone]   Fax:  [phone]  Name: Megan [name] MRN: [id number] Date of Birth: March 13, 1946

## 2015-09-24 ENCOUNTER — Ambulatory Visit (HOSPITAL_COMMUNITY): Payer: Medicare Other | Admitting: Physical Therapy

## 2015-09-24 DIAGNOSIS — R262 Difficulty in walking, not elsewhere classified: Secondary | ICD-10-CM

## 2015-09-24 DIAGNOSIS — M79605 Pain in left leg: Secondary | ICD-10-CM | POA: Diagnosis not present

## 2015-09-24 DIAGNOSIS — I890 Lymphedema, not elsewhere classified: Secondary | ICD-10-CM

## 2015-09-24 NOTE — Therapy (Signed)
[place], [place], [address] Phone: [phone]   Fax:  [phone]  Physical Therapy Treatment  Patient Details  Name: Megan [name] MRN: [id number] Date of Birth: 12-Mar-1946 Referring Provider: Dr. Terald Sleeper  Encounter Date: 09/24/2015      PT End of Session - 09/24/15 1809    Visit Number 7   Number of Visits 12   Date for PT Re-Evaluation 10/04/15   Authorization Type Medicare A and B   Authorization - Visit Number 7   Authorization - Number of Visits 10   PT Start Time [id number]   PT Stop Time 1210   PT Time Calculation (min) 90 min   Activity Tolerance Patient tolerated treatment well   Behavior During Therapy [facility] for tasks assessed/performed      Past Medical History  Diagnosis Date  . Hypertension   . Hyperlipidemia   . GERD (gastroesophageal reflux disease)   . Persistent atrial fibrillation (HCC)      post-termination pauses with afib s/p PPM  . Degenerative joint disease   . Acute diastolic heart failure ([place])   . Sinoatrial node dysfunction (HCC)     s/p PPM  . Congestive heart failure, unspecified   . Unspecified hypothyroidism   . Anxiety state, unspecified   . Unspecified venous (peripheral) insufficiency   . Obesity   . Sleep apnea     compliant with CPAP  . Presence of permanent cardiac pacemaker   . Lymphedema     Past Surgical History  Procedure Laterality Date  . Pacemaker insertion      SJM by JA  . Cataract extraction w/phaco Left 09/09/2014    Procedure: CATARACT EXTRACTION PHACO AND INTRAOCULAR LENS PLACEMENT (IOC);  Surgeon: Tonny Branch, MD;  Location: [facility];  Service: Ophthalmology;  Laterality: Left;  CDE: 6.99  . Cataract extraction w/phaco Right 10/07/2014    Procedure: CATARACT EXTRACTION PHACO AND INTRAOCULAR LENS PLACEMENT RIGHT EYE;  Surgeon: Tonny Branch, MD;  Location: [facility];  Service: Ophthalmology;  Laterality: Right;  CDE:5.16    There were no vitals  filed for this visit.      Subjective Assessment - 09/24/15 1824    Subjective Pt states she is not hurting but draggin today.  States she needs to get more active.  Bandages still intact on Lt UE and juxtas on lower Rt LE.    Currently in Pain? No/denies               LYMPHEDEMA/ONCOLOGY QUESTIONNAIRE - 09/24/15 1808    Right Lower Extremity Lymphedema   20 cm Proximal to Suprapatella 76 cm   10 cm Proximal to Suprapatella 68 cm   At Midpatella/Popliteal Crease 51.8 cm   30 cm Proximal to Floor at Lateral Plantar Foot 49.8 cm   20 cm Proximal to Floor at Lateral Plantar Foot 39.5 1   10  cm Proximal to Floor at Lateral Malleoli 27.6 cm   Circumference of ankle/heel 33.4 cm.   5 cm Proximal to 1st MTP Joint 25.3 cm   Across MTP Joint 23.8 cm   Left Lower Extremity Lymphedema   20 cm Proximal to Suprapatella 80 cm   10 cm Proximal to Suprapatella 67 cm   At Midpatella/Popliteal Crease 52 cm   30 cm Proximal to Floor at Lateral Plantar Foot 48 cm   20 cm Proximal to Floor at Lateral Plantar Foot 37.6 cm   10 cm Proximal to Floor at Lateral  Malleoli 27.8 cm   Circumference of ankle/heel 32.2 cm.   5 cm Proximal to 1st MTP Joint 24.5 cm   Across MTP Joint 23.8 cm                  [facility] Adult PT Treatment/Exercise - 09/24/15 1107    Manual Therapy   Manual Therapy Compression Bandaging;Myofascial release   Manual Lymphatic Drainage (MLD) for bilateeral LE's routing to inguinal axillary anastomosis.  Superficial and deep node activation in cervical and abdominal region   Compression Bandaging cleansed LE followed by moisturizing and donning of Rt juxtafit and compression bandaging including 1/2" foam and short stretch to Lt LE full thigh level                  PT Short Term Goals - 09/17/15 1109    PT SHORT TERM GOAL #1   Title Pt pain in her Lt LE to be no greater than a 3/10 to allow pt to be able to walk for five to ten minutes    Time 2   Period  Weeks   Status On-going   PT SHORT TERM GOAL #2   Title Pt to be able to verbalize the signs an symptoms of cellulitis and the importance of seeking medical assistance immediately to prevent the need for hosipitalization    Time 2   Period Weeks   Status On-going   PT SHORT TERM GOAL #3   Title Pt volume in her Lt LE to be decreased by 3 cm to allow improved fitting of clothes   Status On-going   PT SHORT TERM GOAL #4   Title Pt to have decreased volume in her Lt LE  to allow pt to be able to lift her leg onto the couch/bed with no difficulty.   Time 2   Period Weeks   Status On-going           PT Long Term Goals - 09/17/15 1110    PT LONG TERM GOAL #1   Title Pt to have acquired and be using the compression pump to reduce risk of  cellulitis in the future.    Time 4   Period Weeks   Status On-going   PT LONG TERM GOAL #2   Title PT to have obtained and to be able to don and doff new juxtafit and capris to be able to enter maintance phase to control lymphedema   Time 4   Period Weeks   Status On-going   PT LONG TERM GOAL #3   Title Pt volume in her Lt LE  to decrease by 5 cm to allow ease of donning and doffing clothes    Time 4   Period Weeks   Status On-going   PT LONG TERM GOAL #4   Title Pain in Lt Le to be 0/10 to allow pt to be able to ambulate 10-15 minutes at a time to assist in weight loss   Time 4   Period Weeks   Status On-going               Plan - 09/24/15 1811    Clinical Impression Statement Bilateral LE's measured this session with average reduction of 2cm from Rt LE and 5cm from Lt LE.  Questioned regarding footpiece with juxtas and pt reported overall discomfort and rubbing on her ankles.  Pt states she did not get a velcro ankle piece with hers.  Pt continues to decompress well with manual techniques  and bandaging on Lt, Juxta on Rt.  Spoke to patient regarding if she would like to return to [facility] and get new juxtas or consult with [facility]  for her gaments.  Pt is to let therapist know.  Completed manual  to bilateral LE's with activation of deep abdominal and cervical nodes.  Inguinal axillary anastomosis created bilaterally.  Cleansed and Moisturized B LE's well prior to bandaging.    Rehab Potential Good   PT Frequency 3x / week   PT Duration 4 weeks   PT Treatment/Interventions Manual lymph drainage;Compression bandaging;Patient/family education;ADLs/Self Care Home Management   PT Next Visit Plan continue to progress completeting complete decongestive therapy.  Discuss if decision made regarding getting juxtas at [facility] and Agree with Plan of Care Patient      Patient will benefit from skilled therapeutic intervention in order to improve the following deficits and impairments:  Difficulty walking, Pain, Obesity, Increased edema, Decreased strength, Decreased mobility  Visit Diagnosis: Lymphedema, not elsewhere classified  Difficulty in walking, not elsewhere classified  Pain In Left Leg     Problem List Patient Active Problem List   Diagnosis Date Noted  . Sepsis due to cellulitis ([place]) 08/20/2015  . Weakness generalized 08/17/2015  . Leukocytosis 08/17/2015  . Cellulitis 08/17/2015  . Pyrexia   . Super obesity ([place]) 07/31/2015  . Sleeps in sitting position due to orthopnea 07/31/2015  . Acute on chronic systolic congestive heart failure ([place]) 06/09/2015  . Morbid obesity due to excess calories ([place]) 06/09/2015  . PVD (peripheral vascular disease) with claudication ([place]) 06/09/2015  . Sleep related headaches 06/09/2015  . OSA (obstructive sleep apnea) 06/09/2015  . Encounter for therapeutic drug monitoring 04/24/2013  . Lymphedema of left leg 02/13/2013  . Sleep apnea 08/18/2011  . Obesity 08/18/2011  . Long term (current) use of anticoagulants 06/19/2010  . OTHER NONSPECIFIC FINDING EXAMINATION OF URINE 02/10/2009  . Tachycardia-bradycardia  syndrome ([place]) 11/18/2008  . PPM-St.Jude 11/18/2008  . HYPERLIPIDEMIA-MIXED 02/21/2008  . HYPERTENSION, BENIGN 02/21/2008  . ATRIAL FIBRILLATION 02/21/2008    Teena Irani, PTA/CLT [phone]  09/24/2015, 6:25 PM  [place], [place], [address] Phone: [phone]   Fax:  [phone]  Name: Megan [name] MRN: [id number] Date of Birth: 04/18/1945

## 2015-09-26 ENCOUNTER — Ambulatory Visit (HOSPITAL_COMMUNITY): Payer: Medicare Other | Admitting: Physical Therapy

## 2015-09-26 DIAGNOSIS — R262 Difficulty in walking, not elsewhere classified: Secondary | ICD-10-CM

## 2015-09-26 DIAGNOSIS — M79605 Pain in left leg: Secondary | ICD-10-CM | POA: Diagnosis not present

## 2015-09-26 DIAGNOSIS — I890 Lymphedema, not elsewhere classified: Secondary | ICD-10-CM | POA: Diagnosis not present

## 2015-09-26 NOTE — Therapy (Addendum)
[place], [place], [address] Phone: [phone]   Fax:  [phone]  Physical Therapy Treatment  Patient Details  Name: Megan [name] MRN: [id number] Date of Birth: Feb 23, 1946 Referring Provider: Dr. Terald Sleeper  Encounter Date: 09/26/2015      PT End of Session - 09/26/15 1429    Visit Number 8   Number of Visits 12   Date for PT Re-Evaluation 10/04/15   Authorization Type Medicare A and B   Authorization - Visit Number 8   Authorization - Number of Visits 10   PT Start Time 1300   PT Stop Time 1420   PT Time Calculation (min) 80 min   Activity Tolerance Patient tolerated treatment well   Behavior During Therapy [facility] for tasks assessed/performed      Past Medical History  Diagnosis Date  . Hypertension   . Hyperlipidemia   . GERD (gastroesophageal reflux disease)   . Persistent atrial fibrillation (HCC)      post-termination pauses with afib s/p PPM  . Degenerative joint disease   . Acute diastolic heart failure ([place])   . Sinoatrial node dysfunction (HCC)     s/p PPM  . Congestive heart failure, unspecified   . Unspecified hypothyroidism   . Anxiety state, unspecified   . Unspecified venous (peripheral) insufficiency   . Obesity   . Sleep apnea     compliant with CPAP  . Presence of permanent cardiac pacemaker   . Lymphedema     Past Surgical History  Procedure Laterality Date  . Pacemaker insertion      SJM by JA  . Cataract extraction w/phaco Left 09/09/2014    Procedure: CATARACT EXTRACTION PHACO AND INTRAOCULAR LENS PLACEMENT (IOC);  Surgeon: Tonny Branch, MD;  Location: [facility];  Service: Ophthalmology;  Laterality: Left;  CDE: 6.99  . Cataract extraction w/phaco Right 10/07/2014    Procedure: CATARACT EXTRACTION PHACO AND INTRAOCULAR LENS PLACEMENT RIGHT EYE;  Surgeon: Tonny Branch, MD;  Location: [facility];  Service: Ophthalmology;  Laterality: Right;  CDE:5.16    There were no vitals  filed for this visit.      Subjective Assessment - 09/26/15 1426    Subjective Pt states that she slept better yesterday.  Pt is going to get her juxtafit from [facility].  [facility] will need a prescription, and notes.     Pertinent History pacemaker, Lymphedema,    How long can you sit comfortably? no problem   How long can you stand comfortably? less than five minutes    How long can you walk comfortably? Pt uses a cane and is able to walk short distances only, less than two minutes.  Pt walks slowly    Patient Stated Goals less swelling, decrease pain, improved ambulation    Currently in Pain? No/denies   Pain Onset More than a month ago                         [facility] Adult PT Treatment/Exercise - 09/26/15 0001    Manual Therapy   Manual Therapy Manual Lymphatic Drainage (MLD);Compression Bandaging   Manual Lymphatic Drainage (MLD) supraclavicular; deep and superfical abdoominal followed by routing fluid using inguinal/axillary anastomosis and LE.  Decongestive techniques done bilaterally supine only due to time restraints    Compression Bandaging cleansed LE followed by moisturizing and donning of Rt juxtafit and compression bandaging including 1/2" foam and short stretch to Lt LE full  thigh level                  PT Short Term Goals - 09/26/15 1432    PT SHORT TERM GOAL #1   Title Pt pain in her Lt LE to be no greater than a 3/10 to allow pt to be able to walk for five to ten minutes    Time 2   Period Weeks   Status Achieved   PT SHORT TERM GOAL #2   Title Pt to be able to verbalize the signs an symptoms of cellulitis and the importance of seeking medical assistance immediately to prevent the need for hosipitalization    Time 2   Period Weeks   Status Achieved   PT SHORT TERM GOAL #3   Title Pt volume in her Lt LE to be decreased by 3 cm to allow improved fitting of clothes   Status On-going   PT SHORT TERM GOAL #4   Title Pt to have decreased volume in  her Lt LE  to allow pt to be able to lift her leg onto the couch/bed with no difficulty.   Time 2   Period Weeks   Status Achieved           PT Long Term Goals - 09/26/15 1432    PT LONG TERM GOAL #1   Title Pt to have acquired and be using the compression pump to reduce risk of  cellulitis in the future.    Time 4   Period Weeks   Status On-going   PT LONG TERM GOAL #2   Title PT to have obtained and to be able to don and doff new juxtafit and capris to be able to enter maintance phase to control lymphedema   Time 4   Period Weeks   Status On-going   PT LONG TERM GOAL #3   Title Pt volume in her Lt LE  to decrease by 5 cm to allow ease of donning and doffing clothes    Time 4   Period Weeks   Status On-going   PT LONG TERM GOAL #4   Title Pain in Lt Le to be 0/10 to allow pt to be able to ambulate 10-15 minutes at a time to assist in weight loss   Time 4   Period Weeks   Status On-going               Plan - 09/26/15 1429    Clinical Impression Statement Pt is now ambulating into department with a cane and states that she is able to walk through the grocery store with a cart.  Pt induration has decreased. prescription for juxtafit and send notes to [facility].   Rehab Potential Good   PT Frequency 3x / week   PT Duration 4 weeks   PT Treatment/Interventions Manual lymph drainage;Compression bandaging;Patient/family education;ADLs/Self Care Home Management   PT Next Visit Plan continue to progress completeting complete decongestive therapy until pt recieves new juxtafit.  Recommend thigh high. Follow up with pt that [facility] now has everything and she needs to call and make an appointment.    Consulted and Agree with Plan of Care Patient      Patient will benefit from skilled therapeutic intervention in order to improve the following deficits and impairments:  Difficulty walking, Pain, Obesity, Increased edema, Decreased strength, Decreased mobility  Visit  Diagnosis: Lymphedema, not elsewhere classified  Difficulty in walking, not elsewhere classified  Pain In Left Leg  Problem List Patient Active Problem List   Diagnosis Date Noted  . Sepsis due to cellulitis ([place]) 08/20/2015  . Weakness generalized 08/17/2015  . Leukocytosis 08/17/2015  . Cellulitis 08/17/2015  . Pyrexia   . Super obesity ([place]) 07/31/2015  . Sleeps in sitting position due to orthopnea 07/31/2015  . Acute on chronic systolic congestive heart failure ([place]) 06/09/2015  . Morbid obesity due to excess calories ([place]) 06/09/2015  . PVD (peripheral vascular disease) with claudication ([place]) 06/09/2015  . Sleep related headaches 06/09/2015  . OSA (obstructive sleep apnea) 06/09/2015  . Encounter for therapeutic drug monitoring 04/24/2013  . Lymphedema of left leg 02/13/2013  . Sleep apnea 08/18/2011  . Obesity 08/18/2011  . Long term (current) use of anticoagulants 06/19/2010  . OTHER NONSPECIFIC FINDING EXAMINATION OF URINE 02/10/2009  . Tachycardia-bradycardia syndrome ([place]) 11/18/2008  . PPM-St.Jude 11/18/2008  . HYPERLIPIDEMIA-MIXED 02/21/2008  . HYPERTENSION, BENIGN 02/21/2008  . ATRIAL FIBRILLATION 02/21/2008  Rayetta Humphrey, PT CLT [phone] 09/26/2015, 2:33 PM  [facility] [address] [place], [place], [address] Phone: [phone]   Fax:  [phone]  Name: AJIAH MILLION MRN: [id number] Date of Birth: [birth year]

## 2015-10-01 ENCOUNTER — Ambulatory Visit (HOSPITAL_COMMUNITY): Payer: Medicare Other | Attending: Physician Assistant | Admitting: Physical Therapy

## 2015-10-01 DIAGNOSIS — M79605 Pain in left leg: Secondary | ICD-10-CM | POA: Diagnosis not present

## 2015-10-01 DIAGNOSIS — R262 Difficulty in walking, not elsewhere classified: Secondary | ICD-10-CM | POA: Diagnosis not present

## 2015-10-01 DIAGNOSIS — I890 Lymphedema, not elsewhere classified: Secondary | ICD-10-CM | POA: Insufficient documentation

## 2015-10-01 NOTE — Therapy (Signed)
[place], [place], [address] Phone: [phone]   Fax:  [phone]  Physical Therapy Treatment  Patient Details  Name: Megan [name] MRN: [id number] Date of Birth: [birth year] Referring Provider: Dr. Terald Sleeper  Encounter Date: 10/01/2015      PT End of Session - 10/01/15 1445    Visit Number 9   Number of Visits 12   Date for PT Re-Evaluation 10/04/15   Authorization Type Medicare A and B   Authorization - Visit Number 9   Authorization - Number of Visits 10   PT Start Time 1310   PT Stop Time 1430   PT Time Calculation (min) 80 min   Activity Tolerance Patient tolerated treatment well   Behavior During Therapy [facility] for tasks assessed/performed      Past Medical History  Diagnosis Date  . Hypertension   . Hyperlipidemia   . GERD (gastroesophageal reflux disease)   . Persistent atrial fibrillation (HCC)      post-termination pauses with afib s/p PPM  . Degenerative joint disease   . Acute diastolic heart failure ([place])   . Sinoatrial node dysfunction (HCC)     s/p PPM  . Congestive heart failure, unspecified   . Unspecified hypothyroidism   . Anxiety state, unspecified   . Unspecified venous (peripheral) insufficiency   . Obesity   . Sleep apnea     compliant with CPAP  . Presence of permanent cardiac pacemaker   . Lymphedema     Past Surgical History  Procedure Laterality Date  . Pacemaker insertion      SJM by JA  . Cataract extraction w/phaco Left 09/09/2014    Procedure: CATARACT EXTRACTION PHACO AND INTRAOCULAR LENS PLACEMENT (IOC);  Surgeon: Tonny Branch, MD;  Location: [facility];  Service: Ophthalmology;  Laterality: Left;  CDE: 6.99  . Cataract extraction w/phaco Right 10/07/2014    Procedure: CATARACT EXTRACTION PHACO AND INTRAOCULAR LENS PLACEMENT RIGHT EYE;  Surgeon: Tonny Branch, MD;  Location: [facility];  Service: Ophthalmology;  Laterality: Right;  CDE:5.16    There were no vitals  filed for this visit.      Subjective Assessment - 10/01/15 1442    Subjective Pt comes today with bandages intact on Lt LE (full thigh) and juxta in place on her lower Rt LE.  Pt states no one has contacted her from [facility].   Currently in Pain? No/denies                         [facility] Adult PT Treatment/Exercise - 10/01/15 0001    Manual Therapy   Manual Therapy Manual Lymphatic Drainage (MLD);Compression Bandaging   Manual Lymphatic Drainage (MLD) supraclavicular; deep and superfical abdoominal followed by routing fluid using inguinal/axillary anastomosis and LE.  Decongestive techniques done bilaterally supine only due to time restraints    Compression Bandaging cleansed LE followed by moisturizing and donning of Rt juxtafit and compression bandaging including 1/2" foam and short stretch to Lt LE to knee only                PT Education - 10/01/15 1443    Education provided Yes   Education Details instructed to call [facility] if she has not heard form them.  Advised to purchase pillows and eventually a new mattress to improve complaince with sleeping in her bed.     Person(s) Educated Patient   Methods Explanation   Comprehension Verbalized  understanding          PT Short Term Goals - 09/26/15 1432    PT SHORT TERM GOAL #1   Title Pt pain in her Lt LE to be no greater than a 3/10 to allow pt to be able to walk for five to ten minutes    Time 2   Period Weeks   Status Achieved   PT SHORT TERM GOAL #2   Title Pt to be able to verbalize the signs an symptoms of cellulitis and the importance of seeking medical assistance immediately to prevent the need for hosipitalization    Time 2   Period Weeks   Status Achieved   PT SHORT TERM GOAL #3   Title Pt volume in her Lt LE to be decreased by 3 cm to allow improved fitting of clothes   Status On-going   PT SHORT TERM GOAL #4   Title Pt to have decreased volume in her Lt LE  to allow pt to be able to  lift her leg onto the couch/bed with no difficulty.   Time 2   Period Weeks   Status Achieved           PT Long Term Goals - 09/26/15 1432    PT LONG TERM GOAL #1   Title Pt to have acquired and be using the compression pump to reduce risk of  cellulitis in the future.    Time 4   Period Weeks   Status On-going   PT LONG TERM GOAL #2   Title PT to have obtained and to be able to don and doff new juxtafit and capris to be able to enter maintance phase to control lymphedema   Time 4   Period Weeks   Status On-going   PT LONG TERM GOAL #3   Title Pt volume in her Lt LE  to decrease by 5 cm to allow ease of donning and doffing clothes    Time 4   Period Weeks   Status On-going   PT LONG TERM GOAL #4   Title Pain in Lt Le to be 0/10 to allow pt to be able to ambulate 10-15 minutes at a time to assist in weight loss   Time 4   Period Weeks   Status On-going               Plan - 10/01/15 1445    Clinical Impression Statement Pt admits to not being complaint to what MD's and other health professionals order or suggest.  Spoke with patient regarding the need for a nutritionalist consult as she is having difficulty wtih her weight.  Explained this is a large contributor to her problems with her legs and chapping in her groin and beneath her breasts.  Recommended patient wear a cotton sports bra and cotton panties to decrease skin contact and a rash ointment.  Pt also encourged to ambulate and increase her actvitiy at home.  The top portion of her bandages on Lt thigh were saturated with urine.  Unable to reuse these bandages/foam with pateint instructed to wash these.  Manual lymph drainage completed for bilateral LE's f/b cleansing of Lt LE and moisturizing prior to rebandaging.  Only bandaged to knee level due to only bandages available.  Pt reported comfort.  Therapist attempted contact with [facility] 2 times while patient present to schedule appt, however garment fitter, Clarise Cruz, was  not available.  Left a message for her to set it up.  Rehab Potential Good   PT Frequency 3x / week   PT Duration 4 weeks   PT Treatment/Interventions Manual lymph drainage;Compression bandaging;Patient/family education;ADLs/Self Care Home Management   PT Next Visit Plan continue to progress completeting complete decongestive therapy until pt recieves new thighhigh juxtafit.  Re-cert and gcodes need to be completed next session.     Consulted and Agree with Plan of Care Patient      Patient will benefit from skilled therapeutic intervention in order to improve the following deficits and impairments:  Difficulty walking, Pain, Obesity, Increased edema, Decreased strength, Decreased mobility  Visit Diagnosis: Lymphedema, not elsewhere classified  Difficulty in walking, not elsewhere classified  Pain In Left Leg     Problem List Patient Active Problem List   Diagnosis Date Noted  . Sepsis due to cellulitis ([place]) 08/20/2015  . Weakness generalized 08/17/2015  . Leukocytosis 08/17/2015  . Cellulitis 08/17/2015  . Pyrexia   . Super obesity ([place]) 07/31/2015  . Sleeps in sitting position due to orthopnea 07/31/2015  . Acute on chronic systolic congestive heart failure ([place]) 06/09/2015  . Morbid obesity due to excess calories ([place]) 06/09/2015  . PVD (peripheral vascular disease) with claudication ([place]) 06/09/2015  . Sleep related headaches 06/09/2015  . OSA (obstructive sleep apnea) 06/09/2015  . Encounter for therapeutic drug monitoring 04/24/2013  . Lymphedema of left leg 02/13/2013  . Sleep apnea 08/18/2011  . Obesity 08/18/2011  . Long term (current) use of anticoagulants 06/19/2010  . OTHER NONSPECIFIC FINDING EXAMINATION OF URINE 02/10/2009  . Tachycardia-bradycardia syndrome ([place]) 11/18/2008  . PPM-St.Jude 11/18/2008  . HYPERLIPIDEMIA-MIXED 02/21/2008  . HYPERTENSION, BENIGN 02/21/2008  . ATRIAL FIBRILLATION 02/21/2008    Teena Irani,  PTA/CLT [phone]  10/01/2015, 2:53 PM  [place], [place], [address] Phone: [phone]   Fax:  [phone]  Name: Megan [name] MRN: [id number] Date of Birth: April 08, 1945

## 2015-10-02 ENCOUNTER — Ambulatory Visit (HOSPITAL_COMMUNITY): Payer: Medicare Other | Admitting: Physical Therapy

## 2015-10-02 ENCOUNTER — Telehealth (HOSPITAL_COMMUNITY): Payer: Self-pay | Admitting: Physical Therapy

## 2015-10-02 ENCOUNTER — Ambulatory Visit (INDEPENDENT_AMBULATORY_CARE_PROVIDER_SITE_OTHER): Payer: Medicare Other | Admitting: *Deleted

## 2015-10-02 DIAGNOSIS — Z5181 Encounter for therapeutic drug level monitoring: Secondary | ICD-10-CM | POA: Diagnosis not present

## 2015-10-02 DIAGNOSIS — M79605 Pain in left leg: Secondary | ICD-10-CM | POA: Diagnosis not present

## 2015-10-02 DIAGNOSIS — I890 Lymphedema, not elsewhere classified: Secondary | ICD-10-CM

## 2015-10-02 DIAGNOSIS — I4891 Unspecified atrial fibrillation: Secondary | ICD-10-CM | POA: Diagnosis not present

## 2015-10-02 DIAGNOSIS — R262 Difficulty in walking, not elsewhere classified: Secondary | ICD-10-CM | POA: Diagnosis not present

## 2015-10-02 LAB — POCT INR: INR: 2.1

## 2015-10-02 NOTE — Telephone Encounter (Signed)
Returned pt phone call re needing to get fitted for garment.  Pt called and individual who measures will not be in until 5:30 pm today.  Instructed pt to call back at 5:30 and make an appointment, preferably, Monday to be fitted.  Rayetta Humphrey, [place] CLT [phone]

## 2015-10-02 NOTE — Therapy (Signed)
[place], [place], [address] Phone: [phone]   Fax:  [phone]  Physical Therapy Treatment  Patient Details  Name: Megan [name] MRN: [id number] Date of Birth: [birth year] Referring Provider: Dr. Terald Sleeper  Encounter Date: 10/02/2015      PT End of Session - 10/02/15 1021    Visit Number 10   Number of Visits 12   Date for PT Re-Evaluation 10/04/15   Authorization Type Medicare A and B   Authorization - Visit Number 10   Authorization - Number of Visits 12   PT Start Time 0900   PT Stop Time 1015   PT Time Calculation (min) 75 min   Activity Tolerance Patient tolerated treatment well   Behavior During Therapy [facility] for tasks assessed/performed      Past Medical History  Diagnosis Date  . Hypertension   . Hyperlipidemia   . GERD (gastroesophageal reflux disease)   . Persistent atrial fibrillation (HCC)      post-termination pauses with afib s/p PPM  . Degenerative joint disease   . Acute diastolic heart failure ([place])   . Sinoatrial node dysfunction (HCC)     s/p PPM  . Congestive heart failure, unspecified   . Unspecified hypothyroidism   . Anxiety state, unspecified   . Unspecified venous (peripheral) insufficiency   . Obesity   . Sleep apnea     compliant with CPAP  . Presence of permanent cardiac pacemaker   . Lymphedema     Past Surgical History  Procedure Laterality Date  . Pacemaker insertion      SJM by JA  . Cataract extraction w/phaco Left 09/09/2014    Procedure: CATARACT EXTRACTION PHACO AND INTRAOCULAR LENS PLACEMENT (IOC);  Surgeon: Tonny Branch, MD;  Location: [facility];  Service: Ophthalmology;  Laterality: Left;  CDE: 6.99  . Cataract extraction w/phaco Right 10/07/2014    Procedure: CATARACT EXTRACTION PHACO AND INTRAOCULAR LENS PLACEMENT RIGHT EYE;  Surgeon: Tonny Branch, MD;  Location: [facility];  Service: Ophthalmology;  Laterality: Right;  CDE:5.16    There were no vitals  filed for this visit.      Subjective Assessment - 10/02/15 1017    Subjective Pt states that she has been waiting for [facility] to contact her about a fitting for her juxtafit.  Therapist instructed pt to call them as all information has been faxed to them by therapist last week.     Pertinent History pacemaker, Lymphedema,    How long can you sit comfortably? no problem   How long can you stand comfortably? less than five minutes    How long can you walk comfortably? Pt uses a cane and is able to walk short distances only, less than five minutes.  Was less than two.      Patient Stated Goals less swelling, decrease pain, improved ambulation    Currently in Pain? No/denies  only when therapist is pushing on shin              [facility] Adult PT Treatment/Exercise - 10/02/15 0001    Manual Therapy   Manual Therapy Manual Lymphatic Drainage (MLD);Compression Bandaging   Manual Lymphatic Drainage (MLD) supraclavicular; deep and superfical abdoominal followed by routing fluid using inguinal/axillary anastomosis and LE.  Decongestive techniques done bilaterally supine only due to time restraints    Compression Bandaging cleansed LE followed by moisturizing and donning of Rt juxtafit and compression bandaging including 1/2" foam and short stretch  to Lt LE to thigh                 PT Education - 10/01/15 1443    Education provided Yes   Education Details instructed to call [facility] if she has not heard form them.  Advised to purchase pillows and eventually a new mattress to improve complaince with sleeping in her bed.     Person(s) Educated Patient   Methods Explanation   Comprehension Verbalized understanding          PT Short Term Goals - 10/02/15 1025    PT SHORT TERM GOAL #1   Title Pt pain in her Lt LE to be no greater than a 3/10 to allow pt to be able to walk for five to ten minutes    Time 2   Period Weeks   Status Achieved   PT SHORT TERM GOAL #2   Title Pt to  be able to verbalize the signs an symptoms of cellulitis and the importance of seeking medical assistance immediately to prevent the need for hosipitalization    Time 2   Period Weeks   Status Achieved   PT SHORT TERM GOAL #3   Title Pt volume in her Lt LE to be decreased by 3 cm to allow improved fitting of clothes   Status Partially Met   PT SHORT TERM GOAL #4   Title Pt to have decreased volume in her Lt LE  to allow pt to be able to lift her leg onto the couch/bed with no difficulty.   Time 2   Period Weeks   Status Achieved           PT Long Term Goals - 10/02/15 1025    PT LONG TERM GOAL #1   Title Pt to have acquired and be using the compression pump to reduce risk of  cellulitis in the future.    Time 4   Period Weeks   Status Achieved   PT LONG TERM GOAL #2   Title PT to have obtained and to be able to don and doff new juxtafit and capris to be able to enter maintance phase to control lymphedema   Time 4   Period Weeks   Status On-going   PT LONG TERM GOAL #3   Title Pt volume in her Lt LE  to decrease by 5 cm to allow ease of donning and doffing clothes    Time 4   Period Weeks   Status On-going   PT LONG TERM GOAL #4   Title Pain in Lt Le to be 0/10 to allow pt to be able to ambulate 10-15 minutes at a time to assist in weight loss   Time 4   Period Weeks   Status On-going               Plan - 10/02/15 1021    Clinical Impression Statement Pt no longer has any induration.  She is ready to be fitted for juxtafit but has not made the appointment.  Therapist stressed to patient that she is ready for discharge from a therapy perspective and we will only see her through next week.  [facility] has everything that they need to fit her now she just needs to go in and be fitted.    Rehab Potential Good   PT Frequency 3x / week   PT Duration 4 weeks   PT Treatment/Interventions Manual lymph drainage;Compression bandaging;Patient/family education;ADLs/Self  Care Home Management  PT Next Visit Plan Measure volume.  continue to progress completeting complete decongestive therapy thru next week .  Pt should have been measured and be close to recieving Juxtafit at this time.   Consulted and Agree with Plan of Care Patient      Patient will benefit from skilled therapeutic intervention in order to improve the following deficits and impairments:  Difficulty walking, Pain, Obesity, Increased edema, Decreased strength, Decreased mobility  Visit Diagnosis: Lymphedema, not elsewhere classified  Difficulty in walking, not elsewhere classified  Pain In Left Leg       G-Codes - Oct 29, 2015 1032    Functional Limitation Other PT primary   Other PT Primary Current Status (R6045) At least 20 percent but less than 40 percent impaired, limited or restricted   Other PT Primary Goal Status (W0981) At least 20 percent but less than 40 percent impaired, limited or restricted      Problem List Patient Active Problem List   Diagnosis Date Noted  . Sepsis due to cellulitis ([place]) 08/20/2015  . Weakness generalized 08/17/2015  . Leukocytosis 08/17/2015  . Cellulitis 08/17/2015  . Pyrexia   . Super obesity ([place]) 07/31/2015  . Sleeps in sitting position due to orthopnea 07/31/2015  . Acute on chronic systolic congestive heart failure ([place]) 06/09/2015  . Morbid obesity due to excess calories ([place]) 06/09/2015  . PVD (peripheral vascular disease) with claudication ([place]) 06/09/2015  . Sleep related headaches 06/09/2015  . OSA (obstructive sleep apnea) 06/09/2015  . Encounter for therapeutic drug monitoring 04/24/2013  . Lymphedema of left leg 02/13/2013  . Sleep apnea 08/18/2011  . Obesity 08/18/2011  . Long term (current) use of anticoagulants 06/19/2010  . OTHER NONSPECIFIC FINDING EXAMINATION OF URINE 02/10/2009  . Tachycardia-bradycardia syndrome ([place]) 11/18/2008  . PPM-St.Jude 11/18/2008  . HYPERLIPIDEMIA-MIXED 02/21/2008  . HYPERTENSION, BENIGN  02/21/2008  . ATRIAL FIBRILLATION 02/21/2008    Rayetta Humphrey, PT CLT [phone] 10/29/2015, 10:33 AM  [place] [address] [place], [place], [address] Phone: [phone]   Fax:  [phone]  Name: Megan [name] MRN: [id number] Date of Birth: 1945/11/28

## 2015-10-06 ENCOUNTER — Ambulatory Visit (HOSPITAL_COMMUNITY): Payer: Medicare Other | Admitting: Physical Therapy

## 2015-10-06 DIAGNOSIS — M79605 Pain in left leg: Secondary | ICD-10-CM

## 2015-10-06 DIAGNOSIS — I890 Lymphedema, not elsewhere classified: Secondary | ICD-10-CM | POA: Diagnosis not present

## 2015-10-06 DIAGNOSIS — R262 Difficulty in walking, not elsewhere classified: Secondary | ICD-10-CM

## 2015-10-06 NOTE — Therapy (Signed)
[place], [place], [address] Phone: [phone]   Fax:  [phone]  Physical Therapy Treatment  Patient Details  Name: Megan [name] MRN: [id number] Date of Birth: 11-04-1945 Referring Provider: Dr. Terald Sleeper  Encounter Date: 10/06/2015      PT End of Session - 10/06/15 1809    Visit Number 11   Number of Visits 12   Date for PT Re-Evaluation 10/04/15   Authorization Type Medicare A and B   Authorization - Visit Number 11   Authorization - Number of Visits 12   PT Start Time 1941   PT Stop Time 1635   PT Time Calculation (min) 70 min   Activity Tolerance Patient tolerated treatment well   Behavior During Therapy [facility] for tasks assessed/performed      Past Medical History  Diagnosis Date  . Hypertension   . Hyperlipidemia   . GERD (gastroesophageal reflux disease)   . Persistent atrial fibrillation (HCC)      post-termination pauses with afib s/p PPM  . Degenerative joint disease   . Acute diastolic heart failure ([place])   . Sinoatrial node dysfunction (HCC)     s/p PPM  . Congestive heart failure, unspecified   . Unspecified hypothyroidism   . Anxiety state, unspecified   . Unspecified venous (peripheral) insufficiency   . Obesity   . Sleep apnea     compliant with CPAP  . Presence of permanent cardiac pacemaker   . Lymphedema     Past Surgical History  Procedure Laterality Date  . Pacemaker insertion      SJM by JA  . Cataract extraction w/phaco Left 09/09/2014    Procedure: CATARACT EXTRACTION PHACO AND INTRAOCULAR LENS PLACEMENT (IOC);  Surgeon: Tonny Branch, MD;  Location: [facility];  Service: Ophthalmology;  Laterality: Left;  CDE: 6.99  . Cataract extraction w/phaco Right 10/07/2014    Procedure: CATARACT EXTRACTION PHACO AND INTRAOCULAR LENS PLACEMENT RIGHT EYE;  Surgeon: Tonny Branch, MD;  Location: [facility];  Service: Ophthalmology;  Laterality: Right;  CDE:5.16    There were no vitals  filed for this visit.      Subjective Assessment - 10/06/15 1807    Subjective Pt states she has not made her appt with [facility].  Spoke to [place] at [facility] this morning and she said she would call her to set it up.   Pt is aware that this is her last week and she will need to wear her old juxtas until she gets her new ones.    Currently in Pain? No/denies                         [facility] Adult PT Treatment/Exercise - 10/06/15 1809    Manual Therapy   Manual Therapy Manual Lymphatic Drainage (MLD);Compression Bandaging   Manual Lymphatic Drainage (MLD) supraclavicular; deep and superfical abdoominal followed by routing fluid using inguinal/axillary anastomosis and LE.  Decongestive techniques done bilaterally supine only due to time restraints    Compression Bandaging cleansed LE followed by moisturizing and donning of Rt juxtafit and compression bandaging including 1/2" foam and short stretch to Lt LE to knee                   PT Short Term Goals - 10/02/15 1025    PT SHORT TERM GOAL #1   Title Pt pain in her Lt LE to be no greater than a 3/10 to  allow pt to be able to walk for five to ten minutes    Time 2   Period Weeks   Status Achieved   PT SHORT TERM GOAL #2   Title Pt to be able to verbalize the signs an symptoms of cellulitis and the importance of seeking medical assistance immediately to prevent the need for hosipitalization    Time 2   Period Weeks   Status Achieved   PT SHORT TERM GOAL #3   Title Pt volume in her Lt LE to be decreased by 3 cm to allow improved fitting of clothes   Status Partially Met   PT SHORT TERM GOAL #4   Title Pt to have decreased volume in her Lt LE  to allow pt to be able to lift her leg onto the couch/bed with no difficulty.   Time 2   Period Weeks   Status Achieved           PT Long Term Goals - 10/02/15 1025    PT LONG TERM GOAL #1   Title Pt to have acquired and be using the compression pump to reduce risk of   cellulitis in the future.    Time 4   Period Weeks   Status Achieved   PT LONG TERM GOAL #2   Title PT to have obtained and to be able to don and doff new juxtafit and capris to be able to enter maintance phase to control lymphedema   Time 4   Period Weeks   Status On-going   PT LONG TERM GOAL #3   Title Pt volume in her Lt LE  to decrease by 5 cm to allow ease of donning and doffing clothes    Time 4   Period Weeks   Status On-going   PT LONG TERM GOAL #4   Title Pain in Lt Le to be 0/10 to allow pt to be able to ambulate 10-15 minutes at a time to assist in weight loss   Time 4   Period Weeks   Status On-going               Plan - 10/06/15 1810    Clinical Impression Statement Pt continues to be free of induration bilaterally.  Encouraged to make appt with nutritionalist to help her with weight reduction.  Pt also instructed to contact [facility] tomorrow if she has not heard from them by the end of the day.  Manual to bilateral and bandaging completed to Lt LE.  Pt requested not to bandage the thight this session.     Rehab Potential Good   PT Frequency 3x / week   PT Duration 4 weeks   PT Treatment/Interventions Manual lymph drainage;Compression bandaging;Patient/family education;ADLs/Self Care Home Management   PT Next Visit Plan Complete final measurements and discharge next visit per PT orders.    Consulted and Agree with Plan of Care Patient      Patient will benefit from skilled therapeutic intervention in order to improve the following deficits and impairments:  Difficulty walking, Pain, Obesity, Increased edema, Decreased strength, Decreased mobility  Visit Diagnosis: Lymphedema, not elsewhere classified  Difficulty in walking, not elsewhere classified  Pain In Left Leg     Problem List Patient Active Problem List   Diagnosis Date Noted  . Sepsis due to cellulitis ([place]) 08/20/2015  . Weakness generalized 08/17/2015  . Leukocytosis 08/17/2015  .  Cellulitis 08/17/2015  . Pyrexia   . Super obesity ([place]) 07/31/2015  . Sleeps  in sitting position due to orthopnea 07/31/2015  . Acute on chronic systolic congestive heart failure ([place]) 06/09/2015  . Morbid obesity due to excess calories ([place]) 06/09/2015  . PVD (peripheral vascular disease) with claudication ([place]) 06/09/2015  . Sleep related headaches 06/09/2015  . OSA (obstructive sleep apnea) 06/09/2015  . Encounter for therapeutic drug monitoring 04/24/2013  . Lymphedema of left leg 02/13/2013  . Sleep apnea 08/18/2011  . Obesity 08/18/2011  . Long term (current) use of anticoagulants 06/19/2010  . OTHER NONSPECIFIC FINDING EXAMINATION OF URINE 02/10/2009  . Tachycardia-bradycardia syndrome ([place]) 11/18/2008  . PPM-St.Jude 11/18/2008  . HYPERLIPIDEMIA-MIXED 02/21/2008  . HYPERTENSION, BENIGN 02/21/2008  . ATRIAL FIBRILLATION 02/21/2008    Teena Irani, PTA/CLT [phone]  10/06/2015, 6:12 PM  [place] [address] [place], [place], [address] Phone: [phone]   Fax:  [phone]  Name: Megan [name] MRN: [id number] Date of Birth: Nov 26, 1945

## 2015-10-08 ENCOUNTER — Ambulatory Visit (HOSPITAL_COMMUNITY): Payer: Medicare Other | Admitting: Physical Therapy

## 2015-10-08 DIAGNOSIS — I890 Lymphedema, not elsewhere classified: Secondary | ICD-10-CM

## 2015-10-08 DIAGNOSIS — M79605 Pain in left leg: Secondary | ICD-10-CM | POA: Diagnosis not present

## 2015-10-08 DIAGNOSIS — R262 Difficulty in walking, not elsewhere classified: Secondary | ICD-10-CM

## 2015-10-08 NOTE — Therapy (Signed)
[place], [place], [address] Phone: [phone]   Fax:  [phone]  Physical Therapy Treatment  Patient Details  Name: Megan [name] MRN: [id number] Date of Birth: [birth year] Referring Provider: Dr. Terald Sleeper  Encounter Date: 10/08/2015      PT End of Session - 10/08/15 1501    Visit Number 12   Number of Visits 12   Date for PT Re-Evaluation 10/04/15   Authorization Type Medicare A and B   Authorization - Visit Number 12   Authorization - Number of Visits 12   PT Start Time 1440   PT Stop Time 1525   PT Time Calculation (min) 45 min   Activity Tolerance Patient tolerated treatment well   Behavior During Therapy [facility] for tasks assessed/performed      Past Medical History  Diagnosis Date  . Hypertension   . Hyperlipidemia   . GERD (gastroesophageal reflux disease)   . Persistent atrial fibrillation (HCC)      post-termination pauses with afib s/p PPM  . Degenerative joint disease   . Acute diastolic heart failure ([place])   . Sinoatrial node dysfunction (HCC)     s/p PPM  . Congestive heart failure, unspecified   . Unspecified hypothyroidism   . Anxiety state, unspecified   . Unspecified venous (peripheral) insufficiency   . Obesity   . Sleep apnea     compliant with CPAP  . Presence of permanent cardiac pacemaker   . Lymphedema     Past Surgical History  Procedure Laterality Date  . Pacemaker insertion      SJM by JA  . Cataract extraction w/phaco Left 09/09/2014    Procedure: CATARACT EXTRACTION PHACO AND INTRAOCULAR LENS PLACEMENT (IOC);  Surgeon: Tonny Branch, MD;  Location: [facility];  Service: Ophthalmology;  Laterality: Left;  CDE: 6.99  . Cataract extraction w/phaco Right 10/07/2014    Procedure: CATARACT EXTRACTION PHACO AND INTRAOCULAR LENS PLACEMENT RIGHT EYE;  Surgeon: Tonny Branch, MD;  Location: [facility];  Service: Ophthalmology;  Laterality: Right;  CDE:5.16    There were no vitals  filed for this visit.      Subjective Assessment - 10/08/15 1750    Subjective Pt states she has an appointment [facility] this afttern[facility] to get her juxtafits.  Pt states her legs are much improved and her feet are the smallest they have ever been.    Currently in Pain? No/denies        10/08/15 1447  Right Lower Extremity Lymphedema  20 cm Proximal to Suprapatella 76 cm (was 78.2)  10 cm Proximal to Suprapatella 68 cm (was 71.3)  At Midpatella/Popliteal Crease 51 cm (was 53.6)  30 cm Proximal to Floor at Lateral Plantar Foot 49 cm (was 51.2)  20 cm Proximal to Floor at Lateral Plantar Foot 39 1 (ws 40.8)  10 cm Proximal to Floor at Lateral Malleoli 28.3 cm (was 31.0)  Circumference of ankle/heel 33.8 cm. (was 35.7)  5 cm Proximal to 1st MTP Joint 24.7 cm (was 25)  Across MTP Joint 23 cm (was 23.9)  Left Lower Extremity Lymphedema  20 cm Proximal to Suprapatella 80 cm (was 76.3)  10 cm Proximal to Suprapatella 67 cm (was 74.9)  At Midpatella/Popliteal Crease 54 cm (was 62.0)  30 cm Proximal to Floor at Lateral Plantar Foot 49 cm (was 54.5)  20 cm Proximal to Floor at Lateral Plantar Foot 36.4 cm (was 46)  10 cm Proximal to Floor  at Lateral Malleoli 27.5 cm (was 37.6)  Circumference of ankle/heel 32.5 cm. (was 37.6)  5 cm Proximal to 1st MTP Joint 25 cm (was 27.7)  Across MTP Joint 23.7 cm (2qw 25.3)                     [facility] Adult PT Treatment/Exercise - 10/08/15 0001    Manual Therapy   Manual Therapy Other (comment)   Other Manual Therapy measurement of bilateral LE's, moisturizing and assistance with juxtafits bilateral LE's                  PT Short Term Goals - 10/08/15 1757    PT SHORT TERM GOAL #1   Title Pt pain in her Lt LE to be no greater than a 3/10 to allow pt to be able to walk for five to ten minutes    Time 2   Period Weeks   Status Achieved   PT SHORT TERM GOAL #2   Title Pt to be able to verbalize the signs an  symptoms of cellulitis and the importance of seeking medical assistance immediately to prevent the need for hosipitalization    Time 2   Period Weeks   Status Achieved   PT SHORT TERM GOAL #3   Title Pt volume in her Lt LE to be decreased by 3 cm to allow improved fitting of clothes   Status Achieved  pt with average reduction of 3cm from baseline   PT SHORT TERM GOAL #4   Title Pt to have decreased volume in her Lt LE  to allow pt to be able to lift her leg onto the couch/bed with no difficulty.   Time 2   Period Weeks   Status Achieved           PT Long Term Goals - 10/08/15 1758    PT LONG TERM GOAL #1   Title Pt to have acquired and be using the compression pump to reduce risk of  cellulitis in the future.    Time 4   Period Weeks   Status Achieved   PT LONG TERM GOAL #2   Title PT to have obtained and to be able to don and doff new juxtafit and capris to be able to enter maintance phase to control lymphedema   Time 4   Period Weeks   Status Achieved  going today   PT LONG TERM GOAL #3   Title Pt volume in her Lt LE  to decrease by 5 cm to allow ease of donning and doffing clothes    Time 4   Period Weeks   Status Achieved  average of 3cm reduction from baseline   PT LONG TERM GOAL #4   Title Pain in Lt Le to be 0/10 to allow pt to be able to ambulate 10-15 minutes at a time to assist in weight loss   Time 4   Period Weeks   Status Not Met               Plan - 10/08/15 1752    Clinical Impression Statement LE's remeasured this session with overall reduction from initial evaluation but little change from last measurements taken.  Pt is ready for maintenance phase of care.  discussed importance of donning garments every day and removing at night for moisturizing.  Pt verbalized understanding.  Also reminded patient of need to purchase new garments every 6 months.  Pt with interest in returning to therapy  to increase LE strength and increase her overall function.   Order faxed to MD and to evaluate for this when scheduled.  All goals for lymphedema met without further skilled intervention neeeded.    Rehab Potential Good   PT Frequency 3x / week   PT Duration 4 weeks   PT Treatment/Interventions Manual lymph drainage;Compression bandaging;Patient/family education;ADLs/Self Care Home Management   PT Next Visit Plan Discharge from skilled lymphedema care per PT instructions and all goals met (with exception of ambulation LTG) .    Consulted and Agree with Plan of Care Patient      Patient will benefit from skilled therapeutic intervention in order to improve the following deficits and impairments:  Difficulty walking, Pain, Obesity, Increased edema, Decreased strength, Decreased mobility  Visit Diagnosis: Lymphedema, not elsewhere classified  Difficulty in walking, not elsewhere classified  Pain In Left Leg  [id number] CJ [id number] CJ   Problem List Patient Active Problem List   Diagnosis Date Noted  . Sepsis due to cellulitis ([place]) 08/20/2015  . Weakness generalized 08/17/2015  . Leukocytosis 08/17/2015  . Cellulitis 08/17/2015  . Pyrexia   . Super obesity ([place]) 07/31/2015  . Sleeps in sitting position due to orthopnea 07/31/2015  . Acute on chronic systolic congestive heart failure ([place]) 06/09/2015  . Morbid obesity due to excess calories ([place]) 06/09/2015  . PVD (peripheral vascular disease) with claudication ([place]) 06/09/2015  . Sleep related headaches 06/09/2015  . OSA (obstructive sleep apnea) 06/09/2015  . Encounter for therapeutic drug monitoring 04/24/2013  . Lymphedema of left leg 02/13/2013  . Sleep apnea 08/18/2011  . Obesity 08/18/2011  . Long term (current) use of anticoagulants 06/19/2010  . OTHER NONSPECIFIC FINDING EXAMINATION OF URINE 02/10/2009  . Tachycardia-bradycardia syndrome ([place]) 11/18/2008  . PPM-[place] 11/18/2008  . HYPERLIPIDEMIA-MIXED 02/21/2008  . HYPERTENSION, BENIGN 02/21/2008  . ATRIAL FIBRILLATION  02/21/2008    Teena Irani, PTA/CLT [place], PT CLT [phone] 10/08/2015, 6:02 PM  [place], [place], [address] Phone: [phone]   Fax:  [phone]  Name: Megan [name] MRN: [id number] Date of Birth: [birth year]

## 2015-10-13 ENCOUNTER — Encounter (HOSPITAL_COMMUNITY): Payer: Medicare Other | Admitting: Physical Therapy

## 2015-10-15 ENCOUNTER — Encounter (HOSPITAL_COMMUNITY): Payer: Medicare Other | Admitting: Physical Therapy

## 2015-10-17 ENCOUNTER — Encounter (HOSPITAL_COMMUNITY): Payer: Medicare Other | Admitting: Physical Therapy

## 2015-10-20 ENCOUNTER — Encounter (HOSPITAL_COMMUNITY): Payer: Medicare Other | Admitting: Physical Therapy

## 2015-10-22 ENCOUNTER — Encounter (HOSPITAL_COMMUNITY): Payer: Medicare Other | Admitting: Physical Therapy

## 2015-10-23 ENCOUNTER — Ambulatory Visit (INDEPENDENT_AMBULATORY_CARE_PROVIDER_SITE_OTHER): Payer: Medicare Other | Admitting: *Deleted

## 2015-10-23 DIAGNOSIS — Z5181 Encounter for therapeutic drug level monitoring: Secondary | ICD-10-CM

## 2015-10-23 DIAGNOSIS — I4891 Unspecified atrial fibrillation: Secondary | ICD-10-CM | POA: Diagnosis not present

## 2015-10-23 LAB — POCT INR: INR: 2.7

## 2015-10-24 ENCOUNTER — Encounter (HOSPITAL_COMMUNITY): Payer: Medicare Other | Admitting: Physical Therapy

## 2015-10-27 ENCOUNTER — Encounter (HOSPITAL_COMMUNITY): Payer: Medicare Other | Admitting: Physical Therapy

## 2015-10-28 DIAGNOSIS — B351 Tinea unguium: Secondary | ICD-10-CM | POA: Diagnosis not present

## 2015-10-28 DIAGNOSIS — L84 Corns and callosities: Secondary | ICD-10-CM | POA: Diagnosis not present

## 2015-10-28 DIAGNOSIS — I70203 Unspecified atherosclerosis of native arteries of extremities, bilateral legs: Secondary | ICD-10-CM | POA: Diagnosis not present

## 2015-10-29 ENCOUNTER — Ambulatory Visit (HOSPITAL_COMMUNITY): Payer: Medicare Other | Admitting: Physical Therapy

## 2015-10-31 ENCOUNTER — Encounter (HOSPITAL_COMMUNITY): Payer: Medicare Other | Admitting: Physical Therapy

## 2015-11-03 ENCOUNTER — Encounter (HOSPITAL_COMMUNITY): Payer: Medicare Other | Admitting: Physical Therapy

## 2015-11-05 ENCOUNTER — Encounter (HOSPITAL_COMMUNITY): Payer: Medicare Other | Admitting: Physical Therapy

## 2015-11-06 ENCOUNTER — Ambulatory Visit (HOSPITAL_COMMUNITY): Payer: Medicare Other | Attending: *Deleted | Admitting: Physical Therapy

## 2015-11-06 DIAGNOSIS — R2689 Other abnormalities of gait and mobility: Secondary | ICD-10-CM | POA: Insufficient documentation

## 2015-11-06 DIAGNOSIS — M25562 Pain in left knee: Secondary | ICD-10-CM | POA: Diagnosis not present

## 2015-11-06 DIAGNOSIS — R262 Difficulty in walking, not elsewhere classified: Secondary | ICD-10-CM | POA: Diagnosis not present

## 2015-11-06 DIAGNOSIS — M25561 Pain in right knee: Secondary | ICD-10-CM | POA: Diagnosis not present

## 2015-11-06 DIAGNOSIS — M6281 Muscle weakness (generalized): Secondary | ICD-10-CM | POA: Diagnosis not present

## 2015-11-06 NOTE — Therapy (Signed)
[place], [place], [address] Phone: [phone]   Fax:  [phone]  Physical Therapy Evaluation  Patient Details  Name: Megan [name] MRN: [id number] Date of Birth: [birth year] Referring Provider: Octavio Graves  Encounter Date: 11/06/2015      PT End of Session - 11/06/15 1557    Visit Number 1   Number of Visits 12   Date for PT Re-Evaluation 12/06/15   Authorization Type medicare   Authorization - Visit Number 1   Authorization - Number of Visits 12   PT Start Time 1525   PT Stop Time 1605   PT Time Calculation (min) 40 min   Activity Tolerance Patient tolerated treatment well   Behavior During Therapy [facility] for tasks assessed/performed      Past Medical History:  Diagnosis Date  . Acute diastolic heart failure ([place])   . Anxiety state, unspecified   . Congestive heart failure, unspecified   . Degenerative joint disease   . GERD (gastroesophageal reflux disease)   . Hyperlipidemia   . Hypertension   . Lymphedema   . Obesity   . Persistent atrial fibrillation (HCC)     post-termination pauses with afib s/p PPM  . Presence of permanent cardiac pacemaker   . Sinoatrial node dysfunction (HCC)    s/p PPM  . Sleep apnea    compliant with CPAP  . Unspecified hypothyroidism   . Unspecified venous (peripheral) insufficiency     Past Surgical History:  Procedure Laterality Date  . CATARACT EXTRACTION W/PHACO Left 09/09/2014   Procedure: CATARACT EXTRACTION PHACO AND INTRAOCULAR LENS PLACEMENT (IOC);  Surgeon: Tonny Branch, MD;  Location: [facility];  Service: Ophthalmology;  Laterality: Left;  CDE: 6.99  . CATARACT EXTRACTION W/PHACO Right 10/07/2014   Procedure: CATARACT EXTRACTION PHACO AND INTRAOCULAR LENS PLACEMENT RIGHT EYE;  Surgeon: Tonny Branch, MD;  Location: [facility];  Service: Ophthalmology;  Laterality: Right;  CDE:5.16  . PACEMAKER INSERTION     SJM by JA    There were no vitals filed for this  visit.       Subjective Assessment - 11/06/15 1533    Subjective Pt states that she wants to get stronger.     Pertinent History lymphedema, HTN, CHF, obesity, pacemaker    How long can you sit comfortably? no problem    How long can you stand comfortably? 4'   How long can you walk comfortably? Pt has walked with her cane for 5'   Currently in Pain? Yes   Pain Score 3   gets up as high as a 8/10   Pain Location Knee   Pain Orientation Right;Left   Pain Descriptors / Indicators Aching   Pain Type Chronic pain   Pain Onset More than a month ago   Pain Frequency Intermittent   Aggravating Factors  walking   Pain Relieving Factors rest   Effect of Pain on Daily Activities activity             [facility] PT Assessment - 11/06/15 0001      Assessment   Medical Diagnosis B LE weakness with difficulty waling    Referring Provider Octavio Graves   Onset Date/Surgical Date 07/28/15   Hand Dominance Right   Prior Therapy not for this diagnosis pt seen for lymphedema     Precautions   Precautions None     Restrictions   Weight Bearing Restrictions No     Balance Screen   Has  the patient fallen in the past 6 months No   Has the patient had a decrease in activity level because of a fear of falling?  Yes   Is the patient reluctant to leave their home because of a fear of falling?  Yes     [place] residence   Home Access Stairs to enter   Entrance Stairs-Number of Steps 5    Entrance Stairs-Rails Right     Prior Function   Level of Independence Independent with basic ADLs   Vocation Retired   Leisure none      Cognition   Overall Cognitive Status Within Functional Limits for tasks assessed     Functional Tests   Functional tests Single leg stance;Sit to Stand     Single Leg Stance   Comments RT: 2"; LT 2"     Sit to Stand   Comments 19.92 for 5 sit to stands      ROM / Strength   AROM / PROM / Strength Strength     Strength    Strength Assessment Site Hip;Knee;Ankle   Right/Left Hip Right;Left   Right Hip Flexion 3+/5   Right Hip ABduction 3+/5   Left Hip Flexion 3+/5   Left Hip ABduction 3+/5   Right/Left Knee Right;Left   Right Knee Extension 5/5   Left Knee Extension 5/5   Right/Left Ankle Right;Left   Right Ankle Dorsiflexion 5/5   Left Ankle Dorsiflexion 5/5     6 Minute walk- Post Test   6 Minute Walk Post Test --  3' walk test:  250'                            PT Education - 11/06/15 1550    Education provided Yes   Education Details HEP   Person(s) Educated Patient   Methods Explanation;Tactile cues;Verbal cues;Handout   Comprehension Verbalized understanding;Returned demonstration          PT Short Term Goals - 11/06/15 1558      PT SHORT TERM GOAL #1   Title Pt strength in her LE to increase 1/2 grade to allow her to get up from a couch without difficulty    Time 3   Period Weeks   Status New     PT SHORT TERM GOAL #2   Title Pt core strength to be increased 1/2 grade to allow pt to be able to complete bed mobility with ease    Time 3   Period Weeks   Status New     PT SHORT TERM GOAL #3   Title Pt to be able to single leg stance on both legs for ten seconds to reduce risk of falls.    Time 3   Period Weeks     PT SHORT TERM GOAL #4   Title Pt to be able to walk with her cane for 10 minutes without stopping    Time 3   Period Weeks           PT Long Term Goals - 11/06/15 1613      PT LONG TERM GOAL #1   Title Pt bilateral leg strength to be increased one grade to allow pt to go up and down steps with ease    Time 6   Period Weeks   Status New     PT LONG TERM GOAL #2   Title Pt knee pain to be no  greater than a 3/10 to allow pt to be walking with a cane for 20 minutes without stopping    Time 6   Period Weeks   Status New     PT LONG TERM GOAL #3   Title Pt to be able to stand for 10 minutes to be able to cook a small meal    Time 6    Period Weeks   Status New     PT LONG TERM GOAL #4   Title Pt to be able to single leg stance for 20 seconds on both LE to allow pt to feel confident walking in her yard.    Time 6   Period Weeks   Status New     PT LONG TERM GOAL #5   Title Pt velocity of walking to increase to where pt can walk for 325 feet in 3 minutes.    Time 6   Period Weeks               Plan - 11/06/15 1552    Clinical Impression Statement Megan [name] is a 70 yo female who has noticed that she is losing strength and is not able to walk as easily as she had in the past therefore she went to her MD who referred her to skilled physical therapy.  She has a medical history of lymphedema, CHF, HTN, pacemaker  Examination demonstrates decrased activty tolerance, decreased balance, decreased strength and an abnormal gait.  Megan [name] will benefit from skilled PT to address these issues and maximize her functional tolerance.   Rehab Potential Fair   PT Frequency 2x / week   PT Duration 6 weeks   PT Treatment/Interventions ADLs/Self Care Home Management;Gait training;Stair training;Functional mobility training;Therapeutic activities;Therapeutic exercise;Balance training;Neuromuscular re-education;Patient/family education   PT Next Visit Plan Begin functional squat, SLS, lunges, side stepping progress functional activity.    PT Home Exercise Plan given       Patient will benefit from skilled therapeutic intervention in order to improve the following deficits and impairments:  Abnormal gait, Decreased activity tolerance, Decreased balance, Decreased endurance, Difficulty walking, Decreased strength, Obesity, Pain  Visit Diagnosis: Difficulty in walking, not elsewhere classified - Plan: PT plan of care cert/re-cert  Pain in left knee - Plan: PT plan of care cert/re-cert  Pain in right knee - Plan: PT plan of care cert/re-cert  Muscle weakness (generalized) - Plan: PT plan of care cert/re-cert  Other  abnormalities of gait and mobility - Plan: PT plan of care cert/re-cert      G-Codes - [id number]    Functional Assessment Tool Used clinical judgement based on SLS and walk test    Functional Limitation Mobility: Walking and moving around   Mobility: Walking and Moving Around Current Status [phone]) At least 60 percent but less than 80 percent impaired, limited or restricted   Mobility: Walking and Moving Around Goal Status [phone]) At least 40 percent but less than 60 percent impaired, limited or restricted       Problem List Patient Active Problem List   Diagnosis Date Noted  . Sepsis due to cellulitis ([place]) 08/20/2015  . Weakness generalized 08/17/2015  . Leukocytosis 08/17/2015  . Cellulitis 08/17/2015  . Pyrexia   . Super obesity ([place]) 07/31/2015  . Sleeps in sitting position due to orthopnea 07/31/2015  . Acute on chronic systolic congestive heart failure ([place]) 06/09/2015  . Morbid obesity due to excess calories ([place]) 06/09/2015  . PVD (peripheral vascular disease) with claudication ([place])  06/09/2015  . Sleep related headaches 06/09/2015  . OSA (obstructive sleep apnea) 06/09/2015  . Encounter for therapeutic drug monitoring 04/24/2013  . Lymphedema of left leg 02/13/2013  . Sleep apnea 08/18/2011  . Obesity 08/18/2011  . Long term (current) use of anticoagulants 06/19/2010  . OTHER NONSPECIFIC FINDING EXAMINATION OF URINE 02/10/2009  . Tachycardia-bradycardia syndrome ([place]) 11/18/2008  . PPM-[place] 11/18/2008  . HYPERLIPIDEMIA-MIXED 02/21/2008  . HYPERTENSION, BENIGN 02/21/2008  . ATRIAL FIBRILLATION 02/21/2008   Rayetta Humphrey, PT CLT [phone] 11/06/2015, 4:23 PM  [place] [address] [place], [place], [address] Phone: [phone]   Fax:  [phone]  Name: Megan [name] MRN: [id number] Date of Birth: 1945/05/20

## 2015-11-06 NOTE — Patient Instructions (Addendum)
Strengthening: Straight Leg Raise (Phase 1)    Tighten muscles on front of right thigh, then lift leg 15____ inches from surface, keeping knee locked.  Repeat _10___ times per set. Do ____1 sets per session. Do ____ sessions per day. 2 http://orth.exer.us/614   Copyright  VHI. All rights reserved.  Bridging    Slowly raise buttocks from floor, keeping stomach tight. Repeat __10__ times per set. Do __1__ sets per session. Do _2___ sessions per day.  http://orth.exer.us/1096   Copyright  VHI. All rights reserved.  Strengthening: Hip Abduction (Side-Lying)    Tighten muscles on front of left thigh, then lift leg 15____ inches from surface, keeping knee locked.  Repeat _10___ times per set. Do 1____ sets per session. Do ___2_ sessions per day.  http://orth.exer.us/622   Copyright  VHI. All rights reserved.  Heel Raise: Bilateral (Standing)   At the kitchen counter Rise on balls of feet. Repeat __10__ times per set. Do _1___ sets per session. Do _2___ sessions per day.  http://orth.exer.us/38   Copyright  VHI. All rights reserved.  Functional Quadriceps: Sit to Stand    Sit on edge of chair, feet flat on floor. Stand upright, extending knees fully. Repeat _10___ times per set. Do __1__ sets per session. Do _2___ sessions per day.  http://orth.exer.us/734   Copyright  VHI. All rights reserved.

## 2015-11-07 ENCOUNTER — Encounter (HOSPITAL_COMMUNITY): Payer: Medicare Other | Admitting: Physical Therapy

## 2015-11-10 ENCOUNTER — Encounter (HOSPITAL_COMMUNITY): Payer: Medicare Other | Admitting: Physical Therapy

## 2015-11-11 ENCOUNTER — Ambulatory Visit (HOSPITAL_COMMUNITY): Payer: Medicare Other

## 2015-11-12 ENCOUNTER — Encounter (HOSPITAL_COMMUNITY): Payer: Medicare Other | Admitting: Physical Therapy

## 2015-11-12 ENCOUNTER — Ambulatory Visit (HOSPITAL_COMMUNITY): Payer: Medicare Other

## 2015-11-12 DIAGNOSIS — R2689 Other abnormalities of gait and mobility: Secondary | ICD-10-CM

## 2015-11-12 DIAGNOSIS — M25561 Pain in right knee: Secondary | ICD-10-CM

## 2015-11-12 DIAGNOSIS — M25562 Pain in left knee: Secondary | ICD-10-CM | POA: Diagnosis not present

## 2015-11-12 DIAGNOSIS — M6281 Muscle weakness (generalized): Secondary | ICD-10-CM | POA: Diagnosis not present

## 2015-11-12 DIAGNOSIS — R262 Difficulty in walking, not elsewhere classified: Secondary | ICD-10-CM | POA: Diagnosis not present

## 2015-11-12 NOTE — Therapy (Signed)
[facility] [address] [place], [place], [address] Phone: [phone]   Fax:  [phone]  Physical Therapy Treatment  Patient Details  Name: Megan [name] MRN: [id number] Date of Birth: [birth year] Referring Provider: Octavio Graves  Encounter Date: 11/12/2015      PT End of Session - 11/12/15 1030    Visit Number 2   Number of Visits 12   Date for PT Re-Evaluation 12/06/15   Authorization Type medicare   Authorization - Visit Number 2   Authorization - Number of Visits 12   PT Start Time [id number]   PT Stop Time 1030   PT Time Calculation (min) 38 min   Activity Tolerance Patient tolerated treatment well   Behavior During Therapy [facility] for tasks assessed/performed      Past Medical History:  Diagnosis Date  . Acute diastolic heart failure ([place])   . Anxiety state, unspecified   . Congestive heart failure, unspecified   . Degenerative joint disease   . GERD (gastroesophageal reflux disease)   . Hyperlipidemia   . Hypertension   . Lymphedema   . Obesity   . Persistent atrial fibrillation (HCC)     post-termination pauses with afib s/p PPM  . Presence of permanent cardiac pacemaker   . Sinoatrial node dysfunction (HCC)    s/p PPM  . Sleep apnea    compliant with CPAP  . Unspecified hypothyroidism   . Unspecified venous (peripheral) insufficiency     Past Surgical History:  Procedure Laterality Date  . CATARACT EXTRACTION W/PHACO Left 09/09/2014   Procedure: CATARACT EXTRACTION PHACO AND INTRAOCULAR LENS PLACEMENT (IOC);  Surgeon: Tonny Branch, MD;  Location: [facility];  Service: Ophthalmology;  Laterality: Left;  CDE: 6.99  . CATARACT EXTRACTION W/PHACO Right 10/07/2014   Procedure: CATARACT EXTRACTION PHACO AND INTRAOCULAR LENS PLACEMENT RIGHT EYE;  Surgeon: Tonny Branch, MD;  Location: [facility];  Service: Ophthalmology;  Laterality: Right;  CDE:5.16  . PACEMAKER INSERTION     SJM by JA    There were no vitals filed for this  visit.      Subjective Assessment - 11/12/15 0956    Subjective Pt stated she is feeling good today, reports she has been doing a lot of walking and increased pain in Bil knees and lower back following walk.  Reports she has not completed the HEP at home due to increased pain following walk.     Pertinent History lymphedema, HTN, CHF, obesity, pacemaker    Patient Stated Goals less swelling, decrease pain, improved ambulation    Currently in Pain? No/denies              [facility] Adult PT Treatment/Exercise - 11/12/15 0001      Knee/Hip Exercises: Standing   Heel Raises 15 reps   Heel Raises Limitations toe raises 10x   Forward Lunges 10 reps;Both   Forward Lunges Limitations 6in step   Functional Squat 10 reps   SLS Lt 4", Rt 3" max of 3     Knee/Hip Exercises: Seated   Sit to Sand 5 reps                PT Education - 11/12/15 1257    Education provided Yes   Education Details Reviewed goals, complaince and encouraged to begin HEP for maximal benefits, copy of eval given to pt.     Person(s) Educated Patient   Methods Explanation;Demonstration;Verbal cues;Handout   Comprehension Verbalized understanding;Returned demonstration;Need further instruction  PT Short Term Goals - 11/06/15 1558      PT SHORT TERM GOAL #1   Title Pt strength in her LE to increase 1/2 grade to allow her to get up from a couch without difficulty    Time 3   Period Weeks   Status New     PT SHORT TERM GOAL #2   Title Pt core strength to be increased 1/2 grade to allow pt to be able to complete bed mobility with ease    Time 3   Period Weeks   Status New     PT SHORT TERM GOAL #3   Title Pt to be able to single leg stance on both legs for ten seconds to reduce risk of falls.    Time 3   Period Weeks     PT SHORT TERM GOAL #4   Title Pt to be able to walk with her cane for 10 minutes without stopping    Time 3   Period Weeks           PT Long Term Goals -  11/06/15 1613      PT LONG TERM GOAL #1   Title Pt bilateral leg strength to be increased one grade to allow pt to go up and down steps with ease    Time 6   Period Weeks   Status New     PT LONG TERM GOAL #2   Title Pt knee pain to be no greater than a 3/10 to allow pt to be walking with a cane for 20 minutes without stopping    Time 6   Period Weeks   Status New     PT LONG TERM GOAL #3   Title Pt to be able to stand for 10 minutes to be able to cook a small meal    Time 6   Period Weeks   Status New     PT LONG TERM GOAL #4   Title Pt to be able to single leg stance for 20 seconds on both LE to allow pt to feel confident walking in her yard.    Time 6   Period Weeks   Status New     PT LONG TERM GOAL #5   Title Pt velocity of walking to increase to where pt can walk for 325 feet in 3 minutes.    Time 6   Period Weeks               Plan - 11/12/15 1253    Clinical Impression Statement Reviewed goals, explained importance of compliance with HEP and copy of eval given to pt.  Began [facility]C for functional strengthening and balance training.  Pt able to demonstrate appropriate form and technique with therapist facilitation with majority of therex.  End of session pt limited by fatigue with activity, no reports of pain.     Rehab Potential Fair   PT Frequency 2x / week   PT Duration 6 weeks   PT Treatment/Interventions ADLs/Self Care Home Management;Gait training;Stair training;Functional mobility training;Therapeutic activities;Therapeutic exercise;Balance training;Neuromuscular re-education;Patient/family education   PT Next Visit Plan Continue functional squat, SLS, lunges. Next session begin side stepping progress functional activity.    PT Home Exercise Plan reviewed form and educated importance of completing at home.  No additional exercises given this session.        Patient will benefit from skilled therapeutic intervention in order to improve the following  deficits and impairments:  Abnormal gait, Decreased  activity tolerance, Decreased balance, Decreased endurance, Difficulty walking, Decreased strength, Obesity, Pain  Visit Diagnosis: Difficulty in walking, not elsewhere classified  Pain in left knee  Pain in right knee  Muscle weakness (generalized)  Other abnormalities of gait and mobility     Problem List Patient Active Problem List   Diagnosis Date Noted  . Sepsis due to cellulitis ([place]) 08/20/2015  . Weakness generalized 08/17/2015  . Leukocytosis 08/17/2015  . Cellulitis 08/17/2015  . Pyrexia   . Super obesity ([place]) 07/31/2015  . Sleeps in sitting position due to orthopnea 07/31/2015  . Acute on chronic systolic congestive heart failure ([place]) 06/09/2015  . Morbid obesity due to excess calories ([place]) 06/09/2015  . PVD (peripheral vascular disease) with claudication ([place]) 06/09/2015  . Sleep related headaches 06/09/2015  . OSA (obstructive sleep apnea) 06/09/2015  . Encounter for therapeutic drug monitoring 04/24/2013  . Lymphedema of left leg 02/13/2013  . Sleep apnea 08/18/2011  . Obesity 08/18/2011  . Long term (current) use of anticoagulants 06/19/2010  . OTHER NONSPECIFIC FINDING EXAMINATION OF URINE 02/10/2009  . Tachycardia-bradycardia syndrome ([place]) 11/18/2008  . PPM-St.Jude 11/18/2008  . HYPERLIPIDEMIA-MIXED 02/21/2008  . HYPERTENSION, BENIGN 02/21/2008  . ATRIAL FIBRILLATION 02/21/2008   Ihor Austin, LPTA; [place]  Aldona Lento 11/12/2015, 12:59 PM  [facility] [address] [place], [place], [address] Phone: [phone]   Fax:  [phone]  Name: Megan [name] MRN: [id number] Date of Birth: 1945/08/11

## 2015-11-13 ENCOUNTER — Ambulatory Visit (INDEPENDENT_AMBULATORY_CARE_PROVIDER_SITE_OTHER): Payer: Medicare Other | Admitting: *Deleted

## 2015-11-13 DIAGNOSIS — I4891 Unspecified atrial fibrillation: Secondary | ICD-10-CM | POA: Diagnosis not present

## 2015-11-13 DIAGNOSIS — Z5181 Encounter for therapeutic drug level monitoring: Secondary | ICD-10-CM

## 2015-11-13 LAB — POCT INR: INR: 3

## 2015-11-14 ENCOUNTER — Ambulatory Visit (HOSPITAL_COMMUNITY): Payer: Medicare Other

## 2015-11-14 ENCOUNTER — Encounter (HOSPITAL_COMMUNITY): Payer: Medicare Other | Admitting: Physical Therapy

## 2015-11-14 DIAGNOSIS — M25562 Pain in left knee: Secondary | ICD-10-CM

## 2015-11-14 DIAGNOSIS — M6281 Muscle weakness (generalized): Secondary | ICD-10-CM | POA: Diagnosis not present

## 2015-11-14 DIAGNOSIS — M25561 Pain in right knee: Secondary | ICD-10-CM

## 2015-11-14 DIAGNOSIS — R262 Difficulty in walking, not elsewhere classified: Secondary | ICD-10-CM | POA: Diagnosis not present

## 2015-11-14 DIAGNOSIS — R2689 Other abnormalities of gait and mobility: Secondary | ICD-10-CM | POA: Diagnosis not present

## 2015-11-14 NOTE — Therapy (Signed)
[facility] [address] [place], [place], [address] Phone: [phone]   Fax:  [phone]  Physical Therapy Treatment  Patient Details  Name: Megan [name] MRN: [id number] Date of Birth: 21-May-1945 Referring Provider: Octavio Graves  Encounter Date: 11/14/2015      PT End of Session - 11/14/15 1137    Visit Number 3   Number of Visits 12   Date for PT Re-Evaluation 12/06/15   Authorization Type medicare   Authorization - Visit Number 3   Authorization - Number of Visits 10   PT Start Time [id number]   PT Stop Time 1156   PT Time Calculation (min) 38 min   Activity Tolerance Patient tolerated treatment well   Behavior During Therapy [facility] for tasks assessed/performed      Past Medical History:  Diagnosis Date  . Acute diastolic heart failure ([place])   . Anxiety state, unspecified   . Congestive heart failure, unspecified   . Degenerative joint disease   . GERD (gastroesophageal reflux disease)   . Hyperlipidemia   . Hypertension   . Lymphedema   . Obesity   . Persistent atrial fibrillation (HCC)     post-termination pauses with afib s/p PPM  . Presence of permanent cardiac pacemaker   . Sinoatrial node dysfunction (HCC)    s/p PPM  . Sleep apnea    compliant with CPAP  . Unspecified hypothyroidism   . Unspecified venous (peripheral) insufficiency     Past Surgical History:  Procedure Laterality Date  . CATARACT EXTRACTION W/PHACO Left 09/09/2014   Procedure: CATARACT EXTRACTION PHACO AND INTRAOCULAR LENS PLACEMENT (IOC);  Surgeon: Tonny Branch, MD;  Location: [facility];  Service: Ophthalmology;  Laterality: Left;  CDE: 6.99  . CATARACT EXTRACTION W/PHACO Right 10/07/2014   Procedure: CATARACT EXTRACTION PHACO AND INTRAOCULAR LENS PLACEMENT RIGHT EYE;  Surgeon: Tonny Branch, MD;  Location: [facility];  Service: Ophthalmology;  Laterality: Right;  CDE:5.16  . PACEMAKER INSERTION     SJM by JA    There were no vitals filed for this  visit.      Subjective Assessment - 11/14/15 1125    Subjective Pt isdoing well today. She says she was a bit sore after WED session, but feels better today. HEP is going ok, and she defered HEP questions at this time.    Pertinent History lymphedema, HTN, CHF, obesity, pacemaker    Currently in Pain? No/denies                         [facility] Adult PT Treatment/Exercise - 11/14/15 0001      Ambulation/Gait   Pre-Gait Activities sidestepping in // bars, feet forward, 4x10'   Gait Comments Retro gait in // bars   4x10'     Exercises   Exercises Knee/Hip     Knee/Hip Exercises: Standing   Hip Abduction 1 set;10 reps;Knee straight  bilat     Knee/Hip Exercises: Seated   Knee/Hip Flexion 2x10 bil, arms across chest   VC to avoid rocking   Sit to Sand 10 reps;1 set  Strength is good; no need to repeat     Knee/Hip Exercises: Supine   Bridges 10 reps;2 sets  wedge under trunk du eto GERD   Straight Leg Raises 10 reps;2 sets     Knee/Hip Exercises: Sidelying   Hip ABduction 10 reps  PT Short Term Goals - 11/06/15 1558      PT SHORT TERM GOAL #1   Title Pt strength in her LE to increase 1/2 grade to allow her to get up from a couch without difficulty    Time 3   Period Weeks   Status New     PT SHORT TERM GOAL #2   Title Pt core strength to be increased 1/2 grade to allow pt to be able to complete bed mobility with ease    Time 3   Period Weeks   Status New     PT SHORT TERM GOAL #3   Title Pt to be able to single leg stance on both legs for ten seconds to reduce risk of falls.    Time 3   Period Weeks     PT SHORT TERM GOAL #4   Title Pt to be able to walk with her cane for 10 minutes without stopping    Time 3   Period Weeks           PT Long Term Goals - 11/06/15 1613      PT LONG TERM GOAL #1   Title Pt bilateral leg strength to be increased one grade to allow pt to go up and down steps with ease    Time 6    Period Weeks   Status New     PT LONG TERM GOAL #2   Title Pt knee pain to be no greater than a 3/10 to allow pt to be walking with a cane for 20 minutes without stopping    Time 6   Period Weeks   Status New     PT LONG TERM GOAL #3   Title Pt to be able to stand for 10 minutes to be able to cook a small meal    Time 6   Period Weeks   Status New     PT LONG TERM GOAL #4   Title Pt to be able to single leg stance for 20 seconds on both LE to allow pt to feel confident walking in her yard.    Time 6   Period Weeks   Status New     PT LONG TERM GOAL #5   Title Pt velocity of walking to increase to where pt can walk for 325 feet in 3 minutes.    Time 6   Period Weeks               Plan - 11/14/15 1138    Clinical Impression Statement Pt tolerating session well today, limited mostly by some DOE. She has a great deal to discuss about the new [employer] Alien movie. She demonstrates excellent strength in STS and bridges, and hip flexion remains her primary difficulty both in sitting and supine. The patient's trunk stability also seems limted during activities. The pt requires heavy VC for form, with limited carryover between sets. She is easily distractible and is unable to atend to the task of counting her reps during exercises. Freqeunt rest breaks taken ebtween exercises.    Rehab Potential Fair   PT Frequency 2x / week   PT Duration 6 weeks   PT Treatment/Interventions ADLs/Self Care Home Management;Gait training;Stair training;Functional mobility training;Therapeutic activities;Therapeutic exercise;Balance training;Neuromuscular re-education;Patient/family education   PT Next Visit Plan Progress balance training further. Next session begin side stepping progress functional activity.    Consulted and Agree with Plan of Care Patient      Patient will  benefit from skilled therapeutic intervention in order to improve the following deficits and impairments:  Abnormal gait,  Decreased activity tolerance, Decreased balance, Decreased endurance, Difficulty walking, Decreased strength, Obesity, Pain  Visit Diagnosis: Difficulty in walking, not elsewhere classified  Pain in left knee  Pain in right knee  Muscle weakness (generalized)     Problem List Patient Active Problem List   Diagnosis Date Noted  . Sepsis due to cellulitis ([place]) 08/20/2015  . Weakness generalized 08/17/2015  . Leukocytosis 08/17/2015  . Cellulitis 08/17/2015  . Pyrexia   . Super obesity ([place]) 07/31/2015  . Sleeps in sitting position due to orthopnea 07/31/2015  . Acute on chronic systolic congestive heart failure ([place]) 06/09/2015  . Morbid obesity due to excess calories ([place]) 06/09/2015  . PVD (peripheral vascular disease) with claudication ([place]) 06/09/2015  . Sleep related headaches 06/09/2015  . OSA (obstructive sleep apnea) 06/09/2015  . Encounter for therapeutic drug monitoring 04/24/2013  . Lymphedema of left leg 02/13/2013  . Sleep apnea 08/18/2011  . Obesity 08/18/2011  . Long term (current) use of anticoagulants 06/19/2010  . OTHER NONSPECIFIC FINDING EXAMINATION OF URINE 02/10/2009  . Tachycardia-bradycardia syndrome ([place]) 11/18/2008  . PPM-St.Jude 11/18/2008  . HYPERLIPIDEMIA-MIXED 02/21/2008  . HYPERTENSION, BENIGN 02/21/2008  . ATRIAL FIBRILLATION 02/21/2008    11:57 AM, 11/14/15 Etta Grandchild, PT, DPT Physical Therapist at [facility] [phone] (office)     [facility] [place], [place], [address] Phone: [phone]   Fax:  [phone]  Name: LAURISSA GOBBI MRN: [id number] Date of Birth: 03-16-1946

## 2015-11-18 ENCOUNTER — Ambulatory Visit (HOSPITAL_COMMUNITY): Payer: Medicare Other

## 2015-11-18 ENCOUNTER — Encounter (HOSPITAL_COMMUNITY): Payer: Self-pay

## 2015-11-18 DIAGNOSIS — M6281 Muscle weakness (generalized): Secondary | ICD-10-CM | POA: Diagnosis not present

## 2015-11-18 DIAGNOSIS — M25561 Pain in right knee: Secondary | ICD-10-CM | POA: Diagnosis not present

## 2015-11-18 DIAGNOSIS — R262 Difficulty in walking, not elsewhere classified: Secondary | ICD-10-CM | POA: Diagnosis not present

## 2015-11-18 DIAGNOSIS — M25562 Pain in left knee: Secondary | ICD-10-CM

## 2015-11-18 DIAGNOSIS — R2689 Other abnormalities of gait and mobility: Secondary | ICD-10-CM | POA: Diagnosis not present

## 2015-11-18 NOTE — Therapy (Signed)
[facility] [address] [place], [place], [address] Phone: [phone]   Fax:  [phone]  Physical Therapy Treatment  Patient Details  Name: Megan [name] MRN: [id number] Date of Birth: Mar 31, 1945 Referring Provider: Octavio Graves  Encounter Date: 11/18/2015      PT End of Session - 11/18/15 1406    Visit Number 4   Number of Visits 12   Date for PT Re-Evaluation 12/06/15   Authorization Type medicare   Authorization - Visit Number 4   Authorization - Number of Visits 10   PT Start Time [id number]   PT Stop Time 1430   PT Time Calculation (min) 38 min   Activity Tolerance Patient tolerated treatment well;Patient limited by fatigue   Behavior During Therapy [facility] for tasks assessed/performed      Past Medical History:  Diagnosis Date  . Acute diastolic heart failure ([place])   . Anxiety state, unspecified   . Congestive heart failure, unspecified   . Degenerative joint disease   . GERD (gastroesophageal reflux disease)   . Hyperlipidemia   . Hypertension   . Lymphedema   . Obesity   . Persistent atrial fibrillation (HCC)     post-termination pauses with afib s/p PPM  . Presence of permanent cardiac pacemaker   . Sinoatrial node dysfunction (HCC)    s/p PPM  . Sleep apnea    compliant with CPAP  . Unspecified hypothyroidism   . Unspecified venous (peripheral) insufficiency     Past Surgical History:  Procedure Laterality Date  . CATARACT EXTRACTION W/PHACO Left 09/09/2014   Procedure: CATARACT EXTRACTION PHACO AND INTRAOCULAR LENS PLACEMENT (IOC);  Surgeon: Tonny Branch, MD;  Location: [facility];  Service: Ophthalmology;  Laterality: Left;  CDE: 6.99  . CATARACT EXTRACTION W/PHACO Right 10/07/2014   Procedure: CATARACT EXTRACTION PHACO AND INTRAOCULAR LENS PLACEMENT RIGHT EYE;  Surgeon: Tonny Branch, MD;  Location: [facility];  Service: Ophthalmology;  Laterality: Right;  CDE:5.16  . PACEMAKER INSERTION     SJM by JA    There were  no vitals filed for this visit.      Subjective Assessment - 11/18/15 1356    Subjective Pt reports she is tired today and has been tired the last few days. She cannot account for this difference. She has been trying to do a lot of walking, including walking a twalmart. Last night she reports some SOB while trying to sleep in recliner, has her legs elevated, but not fully reclined. She is unable to say whether she had it imediately or after prolonged recumbency, She denies coughing. She denies CP.    Pertinent History lymphedema, HTN, CHF, obesity, pacemaker    Currently in Pain? No/denies                         [facility] Adult PT Treatment/Exercise - 11/18/15 0001      Ambulation/Gait   Pre-Gait Activities sidestepping in // bars, feet forward, 6x10'   Gait Comments Retro gait in // bars   6x10'     Exercises   Exercises Knee/Hip     Knee/Hip Exercises: Standing   Heel Raises 15 reps   Heel Raises Limitations toe raises 10x  Insufficicent strength, will move to open chain.    Other Standing Knee Exercises Semitandem Stance Blue Ball trunk rotation 1x60s   Narrow on foam, overhead reaching with volley ball 2x60s  PT Short Term Goals - 11/06/15 1558      PT SHORT TERM GOAL #1   Title Pt strength in her LE to increase 1/2 grade to allow her to get up from a couch without difficulty    Time 3   Period Weeks   Status New     PT SHORT TERM GOAL #2   Title Pt core strength to be increased 1/2 grade to allow pt to be able to complete bed mobility with ease    Time 3   Period Weeks   Status New     PT SHORT TERM GOAL #3   Title Pt to be able to single leg stance on both legs for ten seconds to reduce risk of falls.    Time 3   Period Weeks     PT SHORT TERM GOAL #4   Title Pt to be able to walk with her cane for 10 minutes without stopping    Time 3   Period Weeks           PT Long Term Goals - 11/06/15 1613      PT LONG TERM  GOAL #1   Title Pt bilateral leg strength to be increased one grade to allow pt to go up and down steps with ease    Time 6   Period Weeks   Status New     PT LONG TERM GOAL #2   Title Pt knee pain to be no greater than a 3/10 to allow pt to be walking with a cane for 20 minutes without stopping    Time 6   Period Weeks   Status New     PT LONG TERM GOAL #3   Title Pt to be able to stand for 10 minutes to be able to cook a small meal    Time 6   Period Weeks   Status New     PT LONG TERM GOAL #4   Title Pt to be able to single leg stance for 20 seconds on both LE to allow pt to feel confident walking in her yard.    Time 6   Period Weeks   Status New     PT LONG TERM GOAL #5   Title Pt velocity of walking to increase to where pt can walk for 325 feet in 3 minutes.    Time 6   Period Weeks               Plan - 11/18/15 1407    Clinical Impression Statement Pt making progress toward goals today, advancing volume of advanced gait training with minimal increase in fatigue and/or pain. SaO2 reamins >95% throughout although pt does c/o intermittent SOB.    Rehab Potential Fair   PT Frequency 2x / week   PT Duration 6 weeks   PT Treatment/Interventions ADLs/Self Care Home Management;Gait training;Stair training;Functional mobility training;Therapeutic activities;Therapeutic exercise;Balance training;Neuromuscular re-education;Patient/family education   PT Next Visit Plan Progress balance training further, side stepping, functional mobility.   PT Home Exercise Plan reviewed form and educated importance of completing at home.  No additional exercises given this session.     Consulted and Agree with Plan of Care Patient      Patient will benefit from skilled therapeutic intervention in order to improve the following deficits and impairments:  Abnormal gait, Decreased activity tolerance, Decreased balance, Decreased endurance, Difficulty walking, Decreased strength, Obesity,  Pain  Visit Diagnosis: Difficulty in walking, not elsewhere classified  Pain in left knee  Pain in right knee  Muscle weakness (generalized)     Problem List Patient Active Problem List   Diagnosis Date Noted  . Sepsis due to cellulitis ([place]) 08/20/2015  . Weakness generalized 08/17/2015  . Leukocytosis 08/17/2015  . Cellulitis 08/17/2015  . Pyrexia   . Super obesity ([place]) 07/31/2015  . Sleeps in sitting position due to orthopnea 07/31/2015  . Acute on chronic systolic congestive heart failure ([place]) 06/09/2015  . Morbid obesity due to excess calories ([place]) 06/09/2015  . PVD (peripheral vascular disease) with claudication ([place]) 06/09/2015  . Sleep related headaches 06/09/2015  . OSA (obstructive sleep apnea) 06/09/2015  . Encounter for therapeutic drug monitoring 04/24/2013  . Lymphedema of left leg 02/13/2013  . Sleep apnea 08/18/2011  . Obesity 08/18/2011  . Long term (current) use of anticoagulants 06/19/2010  . OTHER NONSPECIFIC FINDING EXAMINATION OF URINE 02/10/2009  . Tachycardia-bradycardia syndrome ([place]) 11/18/2008  . PPM-St.Jude 11/18/2008  . HYPERLIPIDEMIA-MIXED 02/21/2008  . HYPERTENSION, BENIGN 02/21/2008  . ATRIAL FIBRILLATION 02/21/2008    2:29 PM, 11/18/15 Etta Grandchild, PT, DPT Physical Therapist at [facility] [phone] (office)     [place], [place], [address] Phone: [phone]   Fax:  [phone]  Name: Megan [name] MRN: [id number] Date of Birth: [birth year]

## 2015-11-20 ENCOUNTER — Ambulatory Visit (HOSPITAL_COMMUNITY): Payer: Medicare Other | Admitting: Physical Therapy

## 2015-11-20 DIAGNOSIS — M25561 Pain in right knee: Secondary | ICD-10-CM | POA: Diagnosis not present

## 2015-11-20 DIAGNOSIS — M6281 Muscle weakness (generalized): Secondary | ICD-10-CM

## 2015-11-20 DIAGNOSIS — R2689 Other abnormalities of gait and mobility: Secondary | ICD-10-CM

## 2015-11-20 DIAGNOSIS — R262 Difficulty in walking, not elsewhere classified: Secondary | ICD-10-CM

## 2015-11-20 DIAGNOSIS — M25562 Pain in left knee: Secondary | ICD-10-CM

## 2015-11-20 NOTE — Therapy (Signed)
[facility] [address] [place], [place], [address] Phone: [phone]   Fax:  [phone]  Physical Therapy Treatment  Patient Details  Name: Megan [name] MRN: [id number] Date of Birth: 05-11-1945 Referring Provider: Octavio Graves  Encounter Date: 11/20/2015      PT End of Session - 11/20/15 1205    Visit Number 5   Number of Visits 12   Date for PT Re-Evaluation 12/06/15   Authorization Type medicare   Authorization - Visit Number 5   Authorization - Number of Visits 10   PT Start Time [id number]   PT Stop Time 1200   PT Time Calculation (min) 42 min   Activity Tolerance Patient tolerated treatment well;Patient limited by fatigue   Behavior During Therapy [facility] for tasks assessed/performed      Past Medical History:  Diagnosis Date  . Acute diastolic heart failure ([place])   . Anxiety state, unspecified   . Congestive heart failure, unspecified   . Degenerative joint disease   . GERD (gastroesophageal reflux disease)   . Hyperlipidemia   . Hypertension   . Lymphedema   . Obesity   . Persistent atrial fibrillation (HCC)     post-termination pauses with afib s/p PPM  . Presence of permanent cardiac pacemaker   . Sinoatrial node dysfunction (HCC)    s/p PPM  . Sleep apnea    compliant with CPAP  . Unspecified hypothyroidism   . Unspecified venous (peripheral) insufficiency     Past Surgical History:  Procedure Laterality Date  . CATARACT EXTRACTION W/PHACO Left 09/09/2014   Procedure: CATARACT EXTRACTION PHACO AND INTRAOCULAR LENS PLACEMENT (IOC);  Surgeon: Tonny Branch, MD;  Location: [facility];  Service: Ophthalmology;  Laterality: Left;  CDE: 6.99  . CATARACT EXTRACTION W/PHACO Right 10/07/2014   Procedure: CATARACT EXTRACTION PHACO AND INTRAOCULAR LENS PLACEMENT RIGHT EYE;  Surgeon: Tonny Branch, MD;  Location: [facility];  Service: Ophthalmology;  Laterality: Right;  CDE:5.16  . PACEMAKER INSERTION     SJM by JA    There were  no vitals filed for this visit.      Subjective Assessment - 11/20/15 1118    Subjective PT states her BP was low this morning so she didn't take her BP meds.  STates she is still weak and tired.  Reports she is "doing what she can" at home.  Walking everyday 30 minutes and doing her exercises sometimes.   Limitations Sitting   Currently in Pain? No/denies                         [facility] Adult PT Treatment/Exercise - 11/20/15 0001      Ambulation/Gait   Pre-Gait Activities side stepping and retro gait without AD on line 1RT each   Gait Comments gait with SPC 190 feet     Knee/Hip Exercises: Standing   Heel Raises 15 reps   Heel Raises Limitations toe raises 15x   Hip Abduction 1 set;10 reps;Knee straight   Hip Extension Both;10 reps   Lateral Step Up Both;10 reps;Step Height: 4";Hand Hold: 1   Other Standing Knee Exercises Semitandem Stance Blue Ball trunk rotation 1x45s each LE lead   Other Standing Knee Exercises NBOS on foam with UE lifts using volleyball 1 minute                PT Education - 11/20/15 1211    Education provided Yes   Education Details Encouraged patient once  again to acquire her new juxtas as the ones she continues to wear no longer has adequate compression and velcro is worn out.  Pt verbalized understanding.    Person(s) Educated Patient   Methods Explanation   Comprehension Verbalized understanding          PT Short Term Goals - 11/06/15 1558      PT SHORT TERM GOAL #1   Title Pt strength in her LE to increase 1/2 grade to allow her to get up from a couch without difficulty    Time 3   Period Weeks   Status New     PT SHORT TERM GOAL #2   Title Pt core strength to be increased 1/2 grade to allow pt to be able to complete bed mobility with ease    Time 3   Period Weeks   Status New     PT SHORT TERM GOAL #3   Title Pt to be able to single leg stance on both legs for ten seconds to reduce risk of falls.    Time 3    Period Weeks     PT SHORT TERM GOAL #4   Title Pt to be able to walk with her cane for 10 minutes without stopping    Time 3   Period Weeks           PT Long Term Goals - 11/06/15 1613      PT LONG TERM GOAL #1   Title Pt bilateral leg strength to be increased one grade to allow pt to go up and down steps with ease    Time 6   Period Weeks   Status New     PT LONG TERM GOAL #2   Title Pt knee pain to be no greater than a 3/10 to allow pt to be walking with a cane for 20 minutes without stopping    Time 6   Period Weeks   Status New     PT LONG TERM GOAL #3   Title Pt to be able to stand for 10 minutes to be able to cook a small meal    Time 6   Period Weeks   Status New     PT LONG TERM GOAL #4   Title Pt to be able to single leg stance for 20 seconds on both LE to allow pt to feel confident walking in her yard.    Time 6   Period Weeks   Status New     PT LONG TERM GOAL #5   Title Pt velocity of walking to increase to where pt can walk for 325 feet in 3 minutes.    Time 6   Period Weeks               Plan - 11/20/15 1206    Clinical Impression Statement Pt able to complete all exercises this session without rest other than in the beginning after ambulating 190 feet.  Pt tends to "lean" to rest when completing actvities and still presents with alot of fatigue but no outward signs of dyspnea.  Able to complete balance activities outside of parallel bars with cues and added lateral step ups using 1 HHA and hip extensions.  Encouraged patient once again to acquire her new juxtas as the ones she continues to wear no longer has adequate compression and velcro is worn out.  Pt verbalized understanding.   O2 sats monitored without fall under 98% during session.   Rehab  Potential Fair   PT Frequency 2x / week   PT Duration 6 weeks   PT Treatment/Interventions ADLs/Self Care Home Management;Gait training;Stair training;Functional mobility training;Therapeutic  activities;Therapeutic exercise;Balance training;Neuromuscular re-education;Patient/family education   PT Next Visit Plan Progress balance training further, side stepping, functional mobility.   PT Home Exercise Plan reviewed form and educated importance of completing at home.  No additional exercises given this session.     Consulted and Agree with Plan of Care Patient      Patient will benefit from skilled therapeutic intervention in order to improve the following deficits and impairments:  Abnormal gait, Decreased activity tolerance, Decreased balance, Decreased endurance, Difficulty walking, Decreased strength, Obesity, Pain  Visit Diagnosis: Difficulty in walking, not elsewhere classified  Pain in left knee  Pain in right knee  Muscle weakness (generalized)  Other abnormalities of gait and mobility     Problem List Patient Active Problem List   Diagnosis Date Noted  . Sepsis due to cellulitis ([place]) 08/20/2015  . Weakness generalized 08/17/2015  . Leukocytosis 08/17/2015  . Cellulitis 08/17/2015  . Pyrexia   . Super obesity ([place]) 07/31/2015  . Sleeps in sitting position due to orthopnea 07/31/2015  . Acute on chronic systolic congestive heart failure ([place]) 06/09/2015  . Morbid obesity due to excess calories ([place]) 06/09/2015  . PVD (peripheral vascular disease) with claudication ([place]) 06/09/2015  . Sleep related headaches 06/09/2015  . OSA (obstructive sleep apnea) 06/09/2015  . Encounter for therapeutic drug monitoring 04/24/2013  . Lymphedema of left leg 02/13/2013  . Sleep apnea 08/18/2011  . Obesity 08/18/2011  . Long term (current) use of anticoagulants 06/19/2010  . OTHER NONSPECIFIC FINDING EXAMINATION OF URINE 02/10/2009  . Tachycardia-bradycardia syndrome ([place]) 11/18/2008  . PPM-St.Jude 11/18/2008  . HYPERLIPIDEMIA-MIXED 02/21/2008  . HYPERTENSION, BENIGN 02/21/2008  . ATRIAL FIBRILLATION 02/21/2008    Teena Irani, PTA/CLT [phone]  11/20/2015,  12:13 PM  [facility] [address] [place], [place], [address] Phone: [phone]   Fax:  [phone]  Name: Megan [name] MRN: [id number] Date of Birth: Sep 06, 1945

## 2015-11-25 ENCOUNTER — Ambulatory Visit (HOSPITAL_COMMUNITY): Payer: Medicare Other

## 2015-11-25 DIAGNOSIS — M6281 Muscle weakness (generalized): Secondary | ICD-10-CM

## 2015-11-25 DIAGNOSIS — M25561 Pain in right knee: Secondary | ICD-10-CM | POA: Diagnosis not present

## 2015-11-25 DIAGNOSIS — R262 Difficulty in walking, not elsewhere classified: Secondary | ICD-10-CM

## 2015-11-25 DIAGNOSIS — M25562 Pain in left knee: Secondary | ICD-10-CM | POA: Diagnosis not present

## 2015-11-25 DIAGNOSIS — R2689 Other abnormalities of gait and mobility: Secondary | ICD-10-CM | POA: Diagnosis not present

## 2015-11-25 NOTE — Therapy (Signed)
[facility] [address] [place], [place], [address] Phone: [phone]   Fax:  [phone]  Physical Therapy Treatment  Patient Details  Name: Megan [name] MRN: [id number] Date of Birth: 1945-11-30 Referring Provider: Octavio Graves  Encounter Date: 11/25/2015      PT End of Session - 11/25/15 1044    Visit Number 6   Number of Visits 12   Date for PT Re-Evaluation 12/06/15   Authorization Type medicare   Authorization - Visit Number 6   Authorization - Number of Visits 10   PT Start Time [id number]   PT Stop Time 1116   PT Time Calculation (min) 41 min   Equipment Utilized During Treatment Gait belt   Activity Tolerance Patient tolerated treatment well;Patient limited by fatigue   Behavior During Therapy [facility] for tasks assessed/performed      Past Medical History:  Diagnosis Date  . Acute diastolic heart failure ([place])   . Anxiety state, unspecified   . Congestive heart failure, unspecified   . Degenerative joint disease   . GERD (gastroesophageal reflux disease)   . Hyperlipidemia   . Hypertension   . Lymphedema   . Obesity   . Persistent atrial fibrillation (HCC)     post-termination pauses with afib s/p PPM  . Presence of permanent cardiac pacemaker   . Sinoatrial node dysfunction (HCC)    s/p PPM  . Sleep apnea    compliant with CPAP  . Unspecified hypothyroidism   . Unspecified venous (peripheral) insufficiency     Past Surgical History:  Procedure Laterality Date  . CATARACT EXTRACTION W/PHACO Left 09/09/2014   Procedure: CATARACT EXTRACTION PHACO AND INTRAOCULAR LENS PLACEMENT (IOC);  Surgeon: Tonny Branch, MD;  Location: [facility];  Service: Ophthalmology;  Laterality: Left;  CDE: 6.99  . CATARACT EXTRACTION W/PHACO Right 10/07/2014   Procedure: CATARACT EXTRACTION PHACO AND INTRAOCULAR LENS PLACEMENT RIGHT EYE;  Surgeon: Tonny Branch, MD;  Location: [facility];  Service: Ophthalmology;  Laterality: Right;  CDE:5.16  .  PACEMAKER INSERTION     SJM by JA    There were no vitals filed for this visit.      Subjective Assessment - 11/25/15 1040    Subjective Pt reports she has began walking program daily, walked through [place] and grocery store this weekend.  Pt reports her BP usually runs high, meds taken earlier today and feels weak today.     Pertinent History lymphedema, HTN, CHF, obesity, pacemaker    Patient Stated Goals less swelling, decrease pain, improved ambulation    Currently in Pain? Yes   Pain Score 4    Pain Location Head   Pain Orientation Left;Right   Pain Descriptors / Indicators Aching   Pain Type Chronic pain   Pain Onset More than a month ago   Pain Frequency Intermittent   Aggravating Factors  walking   Pain Relieving Factors rest   Effect of Pain on Daily Activities activity                         [facility] Adult PT Treatment/Exercise - 11/25/15 0001      Ambulation/Gait   Pre-Gait Activities sidestepping no UE A 2RT   Gait Comments gait with SPC 190 feet     Knee/Hip Exercises: Standing   Heel Raises 15 reps   Heel Raises Limitations toe raises 15x   Hip Abduction 1 set;10 reps;Knee straight   Hip Extension Both;10 reps  Lateral Step Up Limitations too fatigued to complete   SLS Lt 6", Rt 5" max of 3   Other Standing Knee Exercises Semitandem Stance Blue Ball trunk rotation 1x45s each LE lead   Other Standing Knee Exercises NBOS on foam with UE lifts using volleyball 1 minute                  PT Short Term Goals - 11/06/15 1558      PT SHORT TERM GOAL #1   Title Pt strength in her LE to increase 1/2 grade to allow her to get up from a couch without difficulty    Time 3   Period Weeks   Status New     PT SHORT TERM GOAL #2   Title Pt core strength to be increased 1/2 grade to allow pt to be able to complete bed mobility with ease    Time 3   Period Weeks   Status New     PT SHORT TERM GOAL #3   Title Pt to be able to single leg  stance on both legs for ten seconds to reduce risk of falls.    Time 3   Period Weeks     PT SHORT TERM GOAL #4   Title Pt to be able to walk with her cane for 10 minutes without stopping    Time 3   Period Weeks           PT Long Term Goals - 11/06/15 1613      PT LONG TERM GOAL #1   Title Pt bilateral leg strength to be increased one grade to allow pt to go up and down steps with ease    Time 6   Period Weeks   Status New     PT LONG TERM GOAL #2   Title Pt knee pain to be no greater than a 3/10 to allow pt to be walking with a cane for 20 minutes without stopping    Time 6   Period Weeks   Status New     PT LONG TERM GOAL #3   Title Pt to be able to stand for 10 minutes to be able to cook a small meal    Time 6   Period Weeks   Status New     PT LONG TERM GOAL #4   Title Pt to be able to single leg stance for 20 seconds on both LE to allow pt to feel confident walking in her yard.    Time 6   Period Weeks   Status New     PT LONG TERM GOAL #5   Title Pt velocity of walking to increase to where pt can walk for 325 feet in 3 minutes.    Time 6   Period Weeks               Plan - 11/25/15 1240    Clinical Impression Statement Session focus on improving functional strengthening and balance training. Pt continues to be fatigued with activities requesting frequent rest breaks.  Reviewed compliance and frequency wtih HEP with difficulty assessing compliance.  Pt encouraged to increase frequency for maximal benefits.  Pt with tendency to lean to the Lt with NBOS balance activities requiring min A for safety.  Pt limited by fatigue, no reports of increased pain at EOS.     Rehab Potential Fair   PT Frequency 2x / week   PT Duration 6 weeks   PT Treatment/Interventions  ADLs/Self Care Home Management;Gait training;Stair training;Functional mobility training;Therapeutic activities;Therapeutic exercise;Balance training;Neuromuscular re-education;Patient/family  education   PT Next Visit Plan Progress balance training further, side stepping, functional mobility.  Begin trial on Nustep for activity tolerance and strengthening.   PT Home Exercise Plan reviewed form and educated importance of completing at home.  No additional exercises given this session.        Patient will benefit from skilled therapeutic intervention in order to improve the following deficits and impairments:  Abnormal gait, Decreased activity tolerance, Decreased balance, Decreased endurance, Difficulty walking, Decreased strength, Obesity, Pain  Visit Diagnosis: Difficulty in walking, not elsewhere classified  Pain in left knee  Pain in right knee  Muscle weakness (generalized)  Other abnormalities of gait and mobility     Problem List Patient Active Problem List   Diagnosis Date Noted  . Sepsis due to cellulitis ([place]) 08/20/2015  . Weakness generalized 08/17/2015  . Leukocytosis 08/17/2015  . Cellulitis 08/17/2015  . Pyrexia   . Super obesity ([place]) 07/31/2015  . Sleeps in sitting position due to orthopnea 07/31/2015  . Acute on chronic systolic congestive heart failure ([place]) 06/09/2015  . Morbid obesity due to excess calories ([place]) 06/09/2015  . PVD (peripheral vascular disease) with claudication ([place]) 06/09/2015  . Sleep related headaches 06/09/2015  . OSA (obstructive sleep apnea) 06/09/2015  . Encounter for therapeutic drug monitoring 04/24/2013  . Lymphedema of left leg 02/13/2013  . Sleep apnea 08/18/2011  . Obesity 08/18/2011  . Long term (current) use of anticoagulants 06/19/2010  . OTHER NONSPECIFIC FINDING EXAMINATION OF URINE 02/10/2009  . Tachycardia-bradycardia syndrome ([place]) 11/18/2008  . PPM-St.Jude 11/18/2008  . HYPERLIPIDEMIA-MIXED 02/21/2008  . HYPERTENSION, BENIGN 02/21/2008  . ATRIAL FIBRILLATION 02/21/2008   Ihor Austin, LPTA; [place]  Aldona Lento 11/25/2015, 2:12 PM  [facility] [place], [place], [address] Phone: [phone]   Fax:  [phone]  Name: MISSY VANDERMOLEN MRN: [id number] Date of Birth: [birth year]

## 2015-11-27 ENCOUNTER — Ambulatory Visit (HOSPITAL_COMMUNITY): Payer: Medicare Other | Admitting: Physical Therapy

## 2015-11-27 DIAGNOSIS — M6281 Muscle weakness (generalized): Secondary | ICD-10-CM

## 2015-11-27 DIAGNOSIS — R2689 Other abnormalities of gait and mobility: Secondary | ICD-10-CM | POA: Diagnosis not present

## 2015-11-27 DIAGNOSIS — R262 Difficulty in walking, not elsewhere classified: Secondary | ICD-10-CM | POA: Diagnosis not present

## 2015-11-27 DIAGNOSIS — M25561 Pain in right knee: Secondary | ICD-10-CM

## 2015-11-27 DIAGNOSIS — M25562 Pain in left knee: Secondary | ICD-10-CM

## 2015-11-27 NOTE — Therapy (Signed)
[facility] [address] [place], [place], [address] Phone: [phone]   Fax:  [phone]  Physical Therapy Treatment  Patient Details  Name: Megan [name] MRN: [id number] Date of Birth: 07/20/1945 Referring Provider: Octavio Graves  Encounter Date: 11/27/2015      PT End of Session - 11/27/15 1146    Visit Number 7   Number of Visits 12   Date for PT Re-Evaluation 12/06/15   Authorization Type medicare   Authorization - Visit Number 7   Authorization - Number of Visits 10   PT Start Time 1034   PT Stop Time 1118   PT Time Calculation (min) 44 min   Equipment Utilized During Treatment Gait belt   Activity Tolerance Patient tolerated treatment well;Patient limited by fatigue   Behavior During Therapy [facility] for tasks assessed/performed      Past Medical History:  Diagnosis Date  . Acute diastolic heart failure ([place])   . Anxiety state, unspecified   . Congestive heart failure, unspecified   . Degenerative joint disease   . GERD (gastroesophageal reflux disease)   . Hyperlipidemia   . Hypertension   . Lymphedema   . Obesity   . Persistent atrial fibrillation (HCC)     post-termination pauses with afib s/p PPM  . Presence of permanent cardiac pacemaker   . Sinoatrial node dysfunction (HCC)    s/p PPM  . Sleep apnea    compliant with CPAP  . Unspecified hypothyroidism   . Unspecified venous (peripheral) insufficiency     Past Surgical History:  Procedure Laterality Date  . CATARACT EXTRACTION W/PHACO Left 09/09/2014   Procedure: CATARACT EXTRACTION PHACO AND INTRAOCULAR LENS PLACEMENT (IOC);  Surgeon: Tonny Branch, MD;  Location: [facility];  Service: Ophthalmology;  Laterality: Left;  CDE: 6.99  . CATARACT EXTRACTION W/PHACO Right 10/07/2014   Procedure: CATARACT EXTRACTION PHACO AND INTRAOCULAR LENS PLACEMENT RIGHT EYE;  Surgeon: Tonny Branch, MD;  Location: [facility];  Service: Ophthalmology;  Laterality: Right;  CDE:5.16  .  PACEMAKER INSERTION     SJM by JA    There were no vitals filed for this visit.      Subjective Assessment - 11/27/15 1038    Subjective Pt states her pain comes and goes.  States her legs are more swollen today as she's eaten more salt.  STates she has not made an appointment to get new garments yet.                         [facility] Adult PT Treatment/Exercise - 11/27/15 0001      Ambulation/Gait   Pre-Gait Activities sidestepping no UE's or AD 2RT   Gait Comments gait with SPC 220 feet     Knee/Hip Exercises: Standing   Heel Raises 15 reps   Heel Raises Limitations toe raises 15x   Hip Abduction 1 set;10 reps;Knee straight   Hip Extension Both;10 reps   Lateral Step Up Both;10 reps;Step Height: 4";Hand Hold: 1   SLS Lt 6", Rt 5" max of 5   Other Standing Knee Exercises Semitandem Stance Blue Ball trunk rotation 1x45s each LE lead                  PT Short Term Goals - 11/06/15 1558      PT SHORT TERM GOAL #1   Title Pt strength in her LE to increase 1/2 grade to allow her to get up from a couch without  difficulty    Time 3   Period Weeks   Status New     PT SHORT TERM GOAL #2   Title Pt core strength to be increased 1/2 grade to allow pt to be able to complete bed mobility with ease    Time 3   Period Weeks   Status New     PT SHORT TERM GOAL #3   Title Pt to be able to single leg stance on both legs for ten seconds to reduce risk of falls.    Time 3   Period Weeks     PT SHORT TERM GOAL #4   Title Pt to be able to walk with her cane for 10 minutes without stopping    Time 3   Period Weeks           PT Long Term Goals - 11/06/15 1613      PT LONG TERM GOAL #1   Title Pt bilateral leg strength to be increased one grade to allow pt to go up and down steps with ease    Time 6   Period Weeks   Status New     PT LONG TERM GOAL #2   Title Pt knee pain to be no greater than a 3/10 to allow pt to be walking with a cane for 20  minutes without stopping    Time 6   Period Weeks   Status New     PT LONG TERM GOAL #3   Title Pt to be able to stand for 10 minutes to be able to cook a small meal    Time 6   Period Weeks   Status New     PT LONG TERM GOAL #4   Title Pt to be able to single leg stance for 20 seconds on both LE to allow pt to feel confident walking in her yard.    Time 6   Period Weeks   Status New     PT LONG TERM GOAL #5   Title Pt velocity of walking to increase to where pt can walk for 325 feet in 3 minutes.    Time 6   Period Weeks               Plan - 11/27/15 1147    Clinical Impression Statement Continued to focus in improving functional strength, balance and overall actvitiy tolerance.  Pt able to increase her ambulation distance this session to 220 feet.  Pt able to complete the first 5 exercises without rest but then required short seated rest between each actvitiy until EOS.  Pt with noted fatigue after completing ambulation but able to complete without AD.  Encouraged pateint once again to increase her actvity at home and to make appointment for new compression garments as the ones she currently wears are old and not maintaining her LE's from swelling.   Pt declined trying the nustep this session as she did not want to hold up her transportation.   Rehab Potential Fair   PT Frequency 2x / week   PT Duration 6 weeks   PT Treatment/Interventions ADLs/Self Care Home Management;Gait training;Stair training;Functional mobility training;Therapeutic activities;Therapeutic exercise;Balance training;Neuromuscular re-education;Patient/family education   PT Next Visit Plan Progress balance training further and increase functional mobility.  Begin trial on Nustep for activity tolerance and strengthening.   PT Home Exercise Plan ducated importance of completing at home.  No additional exercises given this session.        Patient  will benefit from skilled therapeutic intervention in order  to improve the following deficits and impairments:  Abnormal gait, Decreased activity tolerance, Decreased balance, Decreased endurance, Difficulty walking, Decreased strength, Obesity, Pain  Visit Diagnosis: Pain in left knee  Pain in right knee  Muscle weakness (generalized)  Difficulty in walking, not elsewhere classified     Problem List Patient Active Problem List   Diagnosis Date Noted  . Sepsis due to cellulitis ([place]) 08/20/2015  . Weakness generalized 08/17/2015  . Leukocytosis 08/17/2015  . Cellulitis 08/17/2015  . Pyrexia   . Super obesity ([place]) 07/31/2015  . Sleeps in sitting position due to orthopnea 07/31/2015  . Acute on chronic systolic congestive heart failure ([place]) 06/09/2015  . Morbid obesity due to excess calories ([place]) 06/09/2015  . PVD (peripheral vascular disease) with claudication ([place]) 06/09/2015  . Sleep related headaches 06/09/2015  . OSA (obstructive sleep apnea) 06/09/2015  . Encounter for therapeutic drug monitoring 04/24/2013  . Lymphedema of left leg 02/13/2013  . Sleep apnea 08/18/2011  . Obesity 08/18/2011  . Long term (current) use of anticoagulants 06/19/2010  . OTHER NONSPECIFIC FINDING EXAMINATION OF URINE 02/10/2009  . Tachycardia-bradycardia syndrome ([place]) 11/18/2008  . PPM-St.Jude 11/18/2008  . HYPERLIPIDEMIA-MIXED 02/21/2008  . HYPERTENSION, BENIGN 02/21/2008  . ATRIAL FIBRILLATION 02/21/2008    Teena Irani, PTA/CLT [phone]  11/27/2015, 11:55 AM  [facility] [address] [place], [place], [address] Phone: [phone]   Fax:  [phone]  Name: KIERSTIN COOGLE MRN: [id number] Date of Birth: [birth year]

## 2015-12-02 ENCOUNTER — Ambulatory Visit (HOSPITAL_COMMUNITY): Payer: Medicare Other | Attending: *Deleted

## 2015-12-02 DIAGNOSIS — M25561 Pain in right knee: Secondary | ICD-10-CM | POA: Diagnosis not present

## 2015-12-02 DIAGNOSIS — M25562 Pain in left knee: Secondary | ICD-10-CM | POA: Diagnosis not present

## 2015-12-02 DIAGNOSIS — R2689 Other abnormalities of gait and mobility: Secondary | ICD-10-CM | POA: Diagnosis not present

## 2015-12-02 DIAGNOSIS — R262 Difficulty in walking, not elsewhere classified: Secondary | ICD-10-CM | POA: Diagnosis not present

## 2015-12-02 DIAGNOSIS — M6281 Muscle weakness (generalized): Secondary | ICD-10-CM | POA: Diagnosis not present

## 2015-12-02 NOTE — Therapy (Signed)
[facility] [address] [place], [place], [address] Phone: [phone]   Fax:  [phone]  Physical Therapy Treatment  Patient Details  Name: Megan [name] MRN: [id number] Date of Birth: June 28, 1945 Referring Provider: Octavio Graves  Encounter Date: 12/02/2015      PT End of Session - 12/02/15 0955    Visit Number 8   Number of Visits 12   Date for PT Re-Evaluation 12/06/15   Authorization Type medicare   Authorization - Visit Number 8   Authorization - Number of Visits 10   PT Start Time [id number]   PT Stop Time 1034   PT Time Calculation (min) 46 min   Equipment Utilized During Treatment Gait belt   Activity Tolerance Patient tolerated treatment well;Patient limited by fatigue   Behavior During Therapy [facility] for tasks assessed/performed      Past Medical History:  Diagnosis Date  . Acute diastolic heart failure ([place])   . Anxiety state, unspecified   . Congestive heart failure, unspecified   . Degenerative joint disease   . GERD (gastroesophageal reflux disease)   . Hyperlipidemia   . Hypertension   . Lymphedema   . Obesity   . Persistent atrial fibrillation (HCC)     post-termination pauses with afib s/p PPM  . Presence of permanent cardiac pacemaker   . Sinoatrial node dysfunction (HCC)    s/p PPM  . Sleep apnea    compliant with CPAP  . Unspecified hypothyroidism   . Unspecified venous (peripheral) insufficiency     Past Surgical History:  Procedure Laterality Date  . CATARACT EXTRACTION W/PHACO Left 09/09/2014   Procedure: CATARACT EXTRACTION PHACO AND INTRAOCULAR LENS PLACEMENT (IOC);  Surgeon: Tonny Branch, MD;  Location: [facility];  Service: Ophthalmology;  Laterality: Left;  CDE: 6.99  . CATARACT EXTRACTION W/PHACO Right 10/07/2014   Procedure: CATARACT EXTRACTION PHACO AND INTRAOCULAR LENS PLACEMENT RIGHT EYE;  Surgeon: Tonny Branch, MD;  Location: [facility];  Service: Ophthalmology;  Laterality: Right;  CDE:5.16  .  PACEMAKER INSERTION     SJM by JA    There were no vitals filed for this visit.      Subjective Assessment - 12/02/15 0951    Subjective Pt stated she has Lt knee soreness pain scale 4/10.  Reports of yeast under Rt breast, has placed kleenex under breast to keep skin from touching today, pain scale 10/10.   Pertinent History lymphedema, HTN, CHF, obesity, pacemaker    Patient Stated Goals less swelling, decrease pain, improved ambulation    Currently in Pain? Yes   Pain Score 4    Pain Location Knee   Pain Orientation Left   Pain Descriptors / Indicators Sore   Pain Type Chronic pain   Pain Onset More than a month ago   Pain Frequency Intermittent   Aggravating Factors  walking   Pain Relieving Factors rest   Effect of Pain on Daily Activities activity   Multiple Pain Sites Yes   Pain Score 10   Pain Location Breast   Pain Orientation Right   Pain Descriptors / Indicators --  redness, pain from yeast   Pain Onset In the past 7 days                         [facility] Adult PT Treatment/Exercise - 12/02/15 0001      Ambulation/Gait   Pre-Gait Activities sidestep 2RT no UE A or AD  Knee/Hip Exercises: Aerobic   Nustep Nustep L2, resistance 2 x 5 min with average SPM 50     Knee/Hip Exercises: Standing   Heel Raises 15 reps   Heel Raises Limitations toe raises 15x   Hip Abduction 1 set;10 reps;Knee straight   Hip Extension Both;10 reps   Lateral Step Up Both;10 reps;Step Height: 4";Hand Hold: 1   SLS Lt 6", Rt 5" max of 5   Other Standing Knee Exercises Semitandem Stance Blue Ball trunk rotation 1x45s each LE lead                  PT Short Term Goals - 11/06/15 1558      PT SHORT TERM GOAL #1   Title Pt strength in her LE to increase 1/2 grade to allow her to get up from a couch without difficulty    Time 3   Period Weeks   Status New     PT SHORT TERM GOAL #2   Title Pt core strength to be increased 1/2 grade to allow pt to be able to  complete bed mobility with ease    Time 3   Period Weeks   Status New     PT SHORT TERM GOAL #3   Title Pt to be able to single leg stance on both legs for ten seconds to reduce risk of falls.    Time 3   Period Weeks     PT SHORT TERM GOAL #4   Title Pt to be able to walk with her cane for 10 minutes without stopping    Time 3   Period Weeks           PT Long Term Goals - 11/06/15 1613      PT LONG TERM GOAL #1   Title Pt bilateral leg strength to be increased one grade to allow pt to go up and down steps with ease    Time 6   Period Weeks   Status New     PT LONG TERM GOAL #2   Title Pt knee pain to be no greater than a 3/10 to allow pt to be walking with a cane for 20 minutes without stopping    Time 6   Period Weeks   Status New     PT LONG TERM GOAL #3   Title Pt to be able to stand for 10 minutes to be able to cook a small meal    Time 6   Period Weeks   Status New     PT LONG TERM GOAL #4   Title Pt to be able to single leg stance for 20 seconds on both LE to allow pt to feel confident walking in her yard.    Time 6   Period Weeks   Status New     PT LONG TERM GOAL #5   Title Pt velocity of walking to increase to where pt can walk for 325 feet in 3 minutes.    Time 6   Period Weeks               Plan - 12/02/15 1049    Clinical Impression Statement Session focus on improving strengtheing, balance training and overall activity tolerance.  Pt continues to be limited by fatigue requiring frequent seated rest breaks between every 2-3 standing exercises and would also pause and rest forward flexed trunk position midway through exercises.  Therapist facilitatin required for proper form and technique with therex.  Ended  session with Nustep to address activity tolerance wth average SPM of 50.  Pt was limited by fatigue with activity, no reports of increased pain.  Reviewed importance of completeing HEP and encouraged to complete more frequently for maximal  benefits.     Rehab Potential Fair   PT Frequency 2x / week   PT Duration 6 weeks   PT Treatment/Interventions ADLs/Self Care Home Management;Gait training;Stair training;Functional mobility training;Therapeutic activities;Therapeutic exercise;Balance training;Neuromuscular re-education;Patient/family education   PT Next Visit Plan Progress balance training further and increase functional mobility.  Continue Nustep for activity tolerance and strengthening.   PT Home Exercise Plan educated importance of completing at home.  No additional exercises given this session.        Patient will benefit from skilled therapeutic intervention in order to improve the following deficits and impairments:  Abnormal gait, Decreased activity tolerance, Decreased balance, Decreased endurance, Difficulty walking, Decreased strength, Obesity, Pain  Visit Diagnosis: Pain in left knee  Pain in right knee  Muscle weakness (generalized)  Difficulty in walking, not elsewhere classified  Other abnormalities of gait and mobility     Problem List Patient Active Problem List   Diagnosis Date Noted  . Sepsis due to cellulitis ([place]) 08/20/2015  . Weakness generalized 08/17/2015  . Leukocytosis 08/17/2015  . Cellulitis 08/17/2015  . Pyrexia   . Super obesity ([place]) 07/31/2015  . Sleeps in sitting position due to orthopnea 07/31/2015  . Acute on chronic systolic congestive heart failure ([place]) 06/09/2015  . Morbid obesity due to excess calories ([place]) 06/09/2015  . PVD (peripheral vascular disease) with claudication ([place]) 06/09/2015  . Sleep related headaches 06/09/2015  . OSA (obstructive sleep apnea) 06/09/2015  . Encounter for therapeutic drug monitoring 04/24/2013  . Lymphedema of left leg 02/13/2013  . Sleep apnea 08/18/2011  . Obesity 08/18/2011  . Long term (current) use of anticoagulants 06/19/2010  . OTHER NONSPECIFIC FINDING EXAMINATION OF URINE 02/10/2009  . Tachycardia-bradycardia syndrome  ([place]) 11/18/2008  . PPM-St.Jude 11/18/2008  . HYPERLIPIDEMIA-MIXED 02/21/2008  . HYPERTENSION, BENIGN 02/21/2008  . ATRIAL FIBRILLATION 02/21/2008   Ihor Austin, LPTA; [place]  Aldona Lento 12/02/2015, 11:01 AM  [facility] [address] [place], [place], [address] Phone: [phone]   Fax:  [phone]  Name: NATARSHA SAMMONS MRN: [id number] Date of Birth: 1945-04-12

## 2015-12-04 ENCOUNTER — Ambulatory Visit (INDEPENDENT_AMBULATORY_CARE_PROVIDER_SITE_OTHER): Payer: Medicare Other | Admitting: *Deleted

## 2015-12-04 ENCOUNTER — Ambulatory Visit (HOSPITAL_COMMUNITY): Payer: Medicare Other

## 2015-12-04 DIAGNOSIS — M25562 Pain in left knee: Secondary | ICD-10-CM

## 2015-12-04 DIAGNOSIS — R2689 Other abnormalities of gait and mobility: Secondary | ICD-10-CM

## 2015-12-04 DIAGNOSIS — M25561 Pain in right knee: Secondary | ICD-10-CM

## 2015-12-04 DIAGNOSIS — R262 Difficulty in walking, not elsewhere classified: Secondary | ICD-10-CM

## 2015-12-04 DIAGNOSIS — Z5181 Encounter for therapeutic drug level monitoring: Secondary | ICD-10-CM

## 2015-12-04 DIAGNOSIS — M6281 Muscle weakness (generalized): Secondary | ICD-10-CM

## 2015-12-04 DIAGNOSIS — I4891 Unspecified atrial fibrillation: Secondary | ICD-10-CM | POA: Diagnosis not present

## 2015-12-04 LAB — POCT INR: INR: 2.5

## 2015-12-04 NOTE — Therapy (Signed)
[facility] [address] [place], [place], [address] Phone: [phone]   Fax:  [phone]  Physical Therapy Treatment  Patient Details  Name: Megan [name] MRN: [id number] Date of Birth: Apr 21, 1945 Referring Provider: Octavio Graves  Encounter Date: 12/04/2015      PT End of Session - 12/04/15 1043    Visit Number 9   Number of Visits 12   Date for PT Re-Evaluation 12/06/15   Authorization Type medicare   Authorization - Visit Number 9   Authorization - Number of Visits 10   PT Start Time [id number]   PT Stop Time 1120   PT Time Calculation (min) 45 min   Equipment Utilized During Treatment Gait belt   Activity Tolerance Patient tolerated treatment well;Patient limited by fatigue   Behavior During Therapy [facility] for tasks assessed/performed      Past Medical History:  Diagnosis Date  . Acute diastolic heart failure ([place])   . Anxiety state, unspecified   . Congestive heart failure, unspecified   . Degenerative joint disease   . GERD (gastroesophageal reflux disease)   . Hyperlipidemia   . Hypertension   . Lymphedema   . Obesity   . Persistent atrial fibrillation (HCC)     post-termination pauses with afib s/p PPM  . Presence of permanent cardiac pacemaker   . Sinoatrial node dysfunction (HCC)    s/p PPM  . Sleep apnea    compliant with CPAP  . Unspecified hypothyroidism   . Unspecified venous (peripheral) insufficiency     Past Surgical History:  Procedure Laterality Date  . CATARACT EXTRACTION W/PHACO Left 09/09/2014   Procedure: CATARACT EXTRACTION PHACO AND INTRAOCULAR LENS PLACEMENT (IOC);  Surgeon: Tonny Branch, MD;  Location: [facility];  Service: Ophthalmology;  Laterality: Left;  CDE: 6.99  . CATARACT EXTRACTION W/PHACO Right 10/07/2014   Procedure: CATARACT EXTRACTION PHACO AND INTRAOCULAR LENS PLACEMENT RIGHT EYE;  Surgeon: Tonny Branch, MD;  Location: [facility];  Service: Ophthalmology;  Laterality: Right;  CDE:5.16  .  PACEMAKER INSERTION     SJM by JA    There were no vitals filed for this visit.      Subjective Assessment - 12/04/15 1041    Subjective Pt stated her knees are feeling good today.  Continues to have discomfort with yeast under breast.  Pt liked the Nustep last session, believes it helped with strengthening.     Pertinent History lymphedema, HTN, CHF, obesity, pacemaker    Patient Stated Goals less swelling, decrease pain, improved ambulation    Currently in Pain? No/denies                         [facility] Adult PT Treatment/Exercise - 12/04/15 0001      Knee/Hip Exercises: Standing   Heel Raises 15 reps   Heel Raises Limitations toe raises 15x   Hip Abduction 1 set;10 reps;Knee straight   Hip Extension Both;10 reps   Lateral Step Up Both;10 reps;Step Height: 4";Hand Hold: 1   Forward Step Up Both;10 reps;Hand Hold: 2;Step Height: 6"   SLS Lt 3", Rt 3"   Other Standing Knee Exercises Semitandem Stance Blue Ball trunk rotation 2x45s each LE on airex   Other Standing Knee Exercises sidestep 2RT; tandem stance 3x 30" on airex     Knee/Hip Exercises: Seated   Sit to Sand 2 sets;10 reps;without UE support  2 sets 5 STS 16.45 then 12.01  PT Short Term Goals - 11/06/15 1558      PT SHORT TERM GOAL #1   Title Pt strength in her LE to increase 1/2 grade to allow her to get up from a couch without difficulty    Time 3   Period Weeks   Status New     PT SHORT TERM GOAL #2   Title Pt core strength to be increased 1/2 grade to allow pt to be able to complete bed mobility with ease    Time 3   Period Weeks   Status New     PT SHORT TERM GOAL #3   Title Pt to be able to single leg stance on both legs for ten seconds to reduce risk of falls.    Time 3   Period Weeks     PT SHORT TERM GOAL #4   Title Pt to be able to walk with her cane for 10 minutes without stopping    Time 3   Period Weeks           PT Long Term Goals - 11/06/15  1613      PT LONG TERM GOAL #1   Title Pt bilateral leg strength to be increased one grade to allow pt to go up and down steps with ease    Time 6   Period Weeks   Status New     PT LONG TERM GOAL #2   Title Pt knee pain to be no greater than a 3/10 to allow pt to be walking with a cane for 20 minutes without stopping    Time 6   Period Weeks   Status New     PT LONG TERM GOAL #3   Title Pt to be able to stand for 10 minutes to be able to cook a small meal    Time 6   Period Weeks   Status New     PT LONG TERM GOAL #4   Title Pt to be able to single leg stance for 20 seconds on both LE to allow pt to feel confident walking in her yard.    Time 6   Period Weeks   Status New     PT LONG TERM GOAL #5   Title Pt velocity of walking to increase to where pt can walk for 325 feet in 3 minutes.    Time 6   Period Weeks               Plan - 12/04/15 1643    Clinical Impression Statement Continued session focus on improving functional strengthening, balance training and overall activity tolerance.  Pt continues to require cueing for proper form and technique with therex and min guard/A with balance activities for safety.  Increased balance activities on dynamic surface with NBOS and reaching outside BOS with min A required for safety.  Pt improving functional strenghtening with ability to complete 5 STS no HHA in 12.01" today.  Ended session with Nustep for activitiy tolerance and strenghteing (not included in billing.)  No reports of pain through session, was limited by fatigue.     Rehab Potential Fair   PT Frequency 2x / week   PT Duration 6 weeks   PT Treatment/Interventions ADLs/Self Care Home Management;Gait training;Stair training;Functional mobility training;Therapeutic activities;Therapeutic exercise;Balance training;Neuromuscular re-education;Patient/family education   PT Next Visit Plan Reassess next session.  Continue to educated importance of completing HEP at home.   Progress balance training further and increase functional mobility.  Continue Nustep for activity tolerance and strengthening.   PT Home Exercise Plan educated importance of completing at home.  No additional exercises given this session.        Patient will benefit from skilled therapeutic intervention in order to improve the following deficits and impairments:  Abnormal gait, Decreased activity tolerance, Decreased balance, Decreased endurance, Difficulty walking, Decreased strength, Obesity, Pain  Visit Diagnosis: Pain in left knee  Pain in right knee  Muscle weakness (generalized)  Difficulty in walking, not elsewhere classified  Other abnormalities of gait and mobility     Problem List Patient Active Problem List   Diagnosis Date Noted  . Sepsis due to cellulitis ([place]) 08/20/2015  . Weakness generalized 08/17/2015  . Leukocytosis 08/17/2015  . Cellulitis 08/17/2015  . Pyrexia   . Super obesity ([place]) 07/31/2015  . Sleeps in sitting position due to orthopnea 07/31/2015  . Acute on chronic systolic congestive heart failure ([place]) 06/09/2015  . Morbid obesity due to excess calories ([place]) 06/09/2015  . PVD (peripheral vascular disease) with claudication ([place]) 06/09/2015  . Sleep related headaches 06/09/2015  . OSA (obstructive sleep apnea) 06/09/2015  . Encounter for therapeutic drug monitoring 04/24/2013  . Lymphedema of left leg 02/13/2013  . Sleep apnea 08/18/2011  . Obesity 08/18/2011  . Long term (current) use of anticoagulants 06/19/2010  . OTHER NONSPECIFIC FINDING EXAMINATION OF URINE 02/10/2009  . Tachycardia-bradycardia syndrome ([place]) 11/18/2008  . PPM-St.Jude 11/18/2008  . HYPERLIPIDEMIA-MIXED 02/21/2008  . HYPERTENSION, BENIGN 02/21/2008  . ATRIAL FIBRILLATION 02/21/2008   Ihor Austin, LPTA; [place]  Aldona Lento 12/04/2015, 4:55 PM  [facility] [place], [place],  [address] Phone: [phone]   Fax:  [phone]  Name: Megan [name] MRN: [id number] Date of Birth: [birth year]

## 2015-12-09 ENCOUNTER — Ambulatory Visit (HOSPITAL_COMMUNITY): Payer: Medicare Other | Admitting: Physical Therapy

## 2015-12-09 ENCOUNTER — Telehealth (HOSPITAL_COMMUNITY): Payer: Self-pay

## 2015-12-09 NOTE — Telephone Encounter (Signed)
9/12 cx - said that she had a sick headache

## 2015-12-11 ENCOUNTER — Ambulatory Visit (HOSPITAL_COMMUNITY): Payer: Medicare Other | Admitting: Physical Therapy

## 2015-12-11 DIAGNOSIS — M25561 Pain in right knee: Secondary | ICD-10-CM

## 2015-12-11 DIAGNOSIS — R262 Difficulty in walking, not elsewhere classified: Secondary | ICD-10-CM | POA: Diagnosis not present

## 2015-12-11 DIAGNOSIS — R2689 Other abnormalities of gait and mobility: Secondary | ICD-10-CM | POA: Diagnosis not present

## 2015-12-11 DIAGNOSIS — M6281 Muscle weakness (generalized): Secondary | ICD-10-CM | POA: Diagnosis not present

## 2015-12-11 DIAGNOSIS — M25562 Pain in left knee: Secondary | ICD-10-CM | POA: Diagnosis not present

## 2015-12-11 NOTE — Therapy (Signed)
[facility] [address] [place], [place], [address] Phone: [phone]   Fax:  [phone]  Physical Therapy Treatment  Patient Details  Name: Megan [name] MRN: [id number] Date of Birth: 1945/06/07 Referring Provider: Octavio Graves  Encounter Date: 12/11/2015      PT End of Session - 12/11/15 1138    Visit Number 10   Number of Visits 10   Date for PT Re-Evaluation 12/06/15   Authorization Type medicare   Authorization - Visit Number 10   Authorization - Number of Visits 10   PT Start Time 2836   PT Stop Time 1115   PT Time Calculation (min) 47 min   Equipment Utilized During Treatment Gait belt   Activity Tolerance Patient tolerated treatment well;Patient limited by fatigue   Behavior During Therapy [facility] for tasks assessed/performed      Past Medical History:  Diagnosis Date  . Acute diastolic heart failure ([place])   . Anxiety state, unspecified   . Congestive heart failure, unspecified   . Degenerative joint disease   . GERD (gastroesophageal reflux disease)   . Hyperlipidemia   . Hypertension   . Lymphedema   . Obesity   . Persistent atrial fibrillation (HCC)     post-termination pauses with afib s/p PPM  . Presence of permanent cardiac pacemaker   . Sinoatrial node dysfunction (HCC)    s/p PPM  . Sleep apnea    compliant with CPAP  . Unspecified hypothyroidism   . Unspecified venous (peripheral) insufficiency     Past Surgical History:  Procedure Laterality Date  . CATARACT EXTRACTION W/PHACO Left 09/09/2014   Procedure: CATARACT EXTRACTION PHACO AND INTRAOCULAR LENS PLACEMENT (IOC);  Surgeon: Tonny Branch, MD;  Location: [facility];  Service: Ophthalmology;  Laterality: Left;  CDE: 6.99  . CATARACT EXTRACTION W/PHACO Right 10/07/2014   Procedure: CATARACT EXTRACTION PHACO AND INTRAOCULAR LENS PLACEMENT RIGHT EYE;  Surgeon: Tonny Branch, MD;  Location: [facility];  Service: Ophthalmology;  Laterality: Right;  CDE:5.16  .  PACEMAKER INSERTION     SJM by JA    There were no vitals filed for this visit.          [facility] PT Assessment - 12/11/15 1034      Assessment   Medical Diagnosis B LE weakness with difficulty waling    Onset Date/Surgical Date 07/28/15   Hand Dominance Right   Prior Therapy not for this diagnosis pt seen for lymphedema     Precautions   Precautions None     Restrictions   Weight Bearing Restrictions No     Home Environment   Living Environment Private residence   Home Access Stairs to enter   Entrance Stairs-Number of Steps 5    Entrance Stairs-Rails Right     Prior Function   Level of Independence Independent with basic ADLs   Vocation Retired   Leisure none      Cognition   Overall Cognitive Status Within Functional Limits for tasks assessed     Observation/Other Assessments   Focus on Therapeutic Outcomes (FOTO)  57  Functional Status now 66 (was 42).  limited now 43% was 68%     Functional Tests   Functional tests Single leg stance;Sit to Stand     Single Leg Stance   Comments  max  5" bilaterally (was 2")     Sit to Stand   Comments 5 STS 10.66 (was 19.92)     Strength   Right Hip  Flexion 5/5  was 3+/5   Right Hip ABduction 4+/5  was 3+/5   Left Hip Flexion 5/5  was 3+/5   Left Hip ABduction 4/5  was 3+/5   Right Knee Extension 5/5  was 5/5   Left Knee Extension 5/5  was 5/5   Right Ankle Dorsiflexion 5/5  was 5/5   Left Ankle Dorsiflexion 5/5  was 5/5     6 Minute walk- Post Test   6 Minute Walk Post Test --  3' walk test:  250'                              PT Education - 12/11/15 1148    Education provided Yes   Education Details Educated to continue HEP and to join the [employer] or other program to increase compliance.  Given another business card for [employer] and urged to call and acquire new compression garments.    Person(s) Educated Patient   Methods Explanation;Demonstration;Handout   Comprehension  Verbalized understanding          PT Short Term Goals - 12/11/15 1047      PT SHORT TERM GOAL #1   Title Pt strength in her LE to increase 1/2 grade to allow her to get up from a couch without difficulty    Time 3   Period Weeks   Status Achieved     PT SHORT TERM GOAL #2   Title Pt core strength to be increased 1/2 grade to allow pt to be able to complete bed mobility with ease    Time 3   Period Weeks   Status Achieved  however still sleeps in recliner     PT SHORT TERM GOAL #3   Title Pt to be able to single leg stance on both legs for ten seconds to reduce risk of falls.    Time 3   Period Weeks   Status On-going     PT SHORT TERM GOAL #4   Title Pt to be able to walk with her cane for 10 minutes without stopping    Time 3   Period Weeks   Status Achieved           PT Long Term Goals - 12/11/15 1054      PT LONG TERM GOAL #1   Title Pt bilateral leg strength to be increased one grade to allow pt to go up and down steps with ease    Time 6   Period Weeks   Status Achieved     PT LONG TERM GOAL #2   Title Pt knee pain to be no greater than a 3/10 to allow pt to be walking with a cane for 20 minutes without stopping    Time 6   Period Weeks   Status Partially Met  knee pain is less than 3/10, unable to walk greater than 15 minutes     PT LONG TERM GOAL #3   Title Pt to be able to stand for 10 minutes to be able to cook a small meal    Time 6   Period Weeks   Status Achieved     PT LONG TERM GOAL #4   Title Pt to be able to single leg stance for 20 seconds on both LE to allow pt to feel confident walking in her yard.    Time 6   Period Weeks   Status Deferred  PT LONG TERM GOAL #5   Title Pt velocity of walking to increase to where pt can walk for 325 feet in 3 minutes.    Time 6   Period Weeks   Status Achieved               Plan - 12/11/15 1150    Clinical Impression Statement Re-evaluation completed this session.  Pt has completed  10 sessions with focus on improving functional strength, balance and actvitiy tolerance.  Pt is now able to walk at least 15 minutes before needing rest and overall gait velocity and safety has improved.  Strength in LE is now [facility] and pateint feels she is ready for discharge to independent program.  Educated to continue HEP and to join the [employer] or other program to increase compliance.  Given another business card for [employer] and urged to call and acquire new compression garments.  PT reports she will do this next week.   Rehab Potential [place]   PT Frequency 2x / week   PT Duration 6 weeks   PT Treatment/Interventions ADLs/Self Care Home Management;Gait training;Stair training;Functional mobility training;Therapeutic activities;Therapeutic exercise;Balance training;Neuromuscular re-education;Patient/family education   PT Next Visit Plan Discharge to HEP as patient has met 90% goals and feels she is ready to continue independent progression.   PT Home Exercise Plan reviewed HEP and verbalized having all needed handouts at home.      Patient will benefit from skilled therapeutic intervention in order to improve the following deficits and impairments:  Abnormal gait, Decreased activity tolerance, Decreased balance, Decreased endurance, Difficulty walking, Decreased strength, Obesity, Pain  Visit Diagnosis: Pain in left knee  Pain in right knee  Muscle weakness (generalized)  G8979  CK G8990  CK   Problem List Patient Active Problem List   Diagnosis Date Noted  . Sepsis due to cellulitis ([place]) 08/20/2015  . Weakness generalized 08/17/2015  . Leukocytosis 08/17/2015  . Cellulitis 08/17/2015  . Pyrexia   . Super obesity ([place]) 07/31/2015  . Sleeps in sitting position due to orthopnea 07/31/2015  . Acute on chronic systolic congestive heart failure ([place]) 06/09/2015  . Morbid obesity due to excess calories ([place]) 06/09/2015  . PVD (peripheral vascular disease) with claudication  ([place]) 06/09/2015  . Sleep related headaches 06/09/2015  . OSA (obstructive sleep apnea) 06/09/2015  . Encounter for therapeutic drug monitoring 04/24/2013  . Lymphedema of left leg 02/13/2013  . Sleep apnea 08/18/2011  . Obesity 08/18/2011  . Long term (current) use of anticoagulants 06/19/2010  . OTHER NONSPECIFIC FINDING EXAMINATION OF URINE 02/10/2009  . Tachycardia-bradycardia syndrome ([place]) 11/18/2008  . PPM-[place] 11/18/2008  . HYPERLIPIDEMIA-MIXED 02/21/2008  . HYPERTENSION, BENIGN 02/21/2008  . ATRIAL FIBRILLATION 02/21/2008   Teena Irani, PTA/CLT [phone]  12/11/2015, 11:53 AM  [facility] [place], [place], [address] Phone: [phone]   Fax:  [phone]  Name: Megan [name] MRN: [id number] Date of Birth: 12-19-1945   PHYSICAL THERAPY DISCHARGE SUMMARY  Visits from Start of Care: 10 Current functional level related to goals / functional outcomes: See above    Remaining deficits: See above    Education:  HEP and the importance of keeping active.  Pt given free 2 week trial at [facility] Plan: Patient agrees to discharge.  Patient goals were partially met. Patient is being discharged due to being pleased with the current functional level.  ?????        Rayetta Humphrey, [place] CLT [phone]

## 2015-12-16 ENCOUNTER — Encounter (HOSPITAL_COMMUNITY): Payer: Medicare Other | Admitting: Physical Therapy

## 2015-12-18 ENCOUNTER — Encounter (HOSPITAL_COMMUNITY): Payer: Medicare Other | Admitting: Physical Therapy

## 2015-12-23 ENCOUNTER — Encounter (HOSPITAL_COMMUNITY): Payer: Medicare Other | Admitting: Physical Therapy

## 2015-12-30 ENCOUNTER — Other Ambulatory Visit: Payer: Self-pay | Admitting: *Deleted

## 2015-12-30 MED ORDER — VITAMIN D (ERGOCALCIFEROL) 1.25 MG (50000 UNIT) PO CAPS: 50000.0000 [IU] | ORAL_CAPSULE | ORAL | 3 refills | Status: DC

## 2016-01-01 ENCOUNTER — Ambulatory Visit (INDEPENDENT_AMBULATORY_CARE_PROVIDER_SITE_OTHER): Payer: Medicare Other | Admitting: *Deleted

## 2016-01-01 DIAGNOSIS — Z23 Encounter for immunization: Secondary | ICD-10-CM

## 2016-01-01 DIAGNOSIS — Z5181 Encounter for therapeutic drug level monitoring: Secondary | ICD-10-CM

## 2016-01-01 DIAGNOSIS — I4891 Unspecified atrial fibrillation: Secondary | ICD-10-CM

## 2016-01-01 LAB — POCT INR: INR: 2.5

## 2016-01-12 ENCOUNTER — Other Ambulatory Visit: Payer: Self-pay | Admitting: Physician Assistant

## 2016-01-17 DIAGNOSIS — L98499 Non-pressure chronic ulcer of skin of other sites with unspecified severity: Secondary | ICD-10-CM | POA: Diagnosis not present

## 2016-01-17 DIAGNOSIS — L732 Hidradenitis suppurativa: Secondary | ICD-10-CM | POA: Diagnosis not present

## 2016-01-19 ENCOUNTER — Other Ambulatory Visit: Payer: Self-pay | Admitting: *Deleted

## 2016-01-19 MED ORDER — WARFARIN SODIUM 3 MG PO TABS: ORAL_TABLET | ORAL | 1 refills | Status: DC

## 2016-01-22 ENCOUNTER — Other Ambulatory Visit: Payer: Self-pay | Admitting: Internal Medicine

## 2016-01-22 ENCOUNTER — Other Ambulatory Visit: Payer: Self-pay | Admitting: *Deleted

## 2016-01-22 MED ORDER — ATORVASTATIN CALCIUM 40 MG PO TABS: 40.0000 mg | ORAL_TABLET | Freq: Every day | ORAL | 0 refills | Status: DC

## 2016-01-22 NOTE — Telephone Encounter (Signed)
Megan [name] called stating that her PCP is no longer available. She needs a refill on Lipitor 80mg 

## 2016-01-22 NOTE — Telephone Encounter (Signed)
Patient requesting refill on her Lipitor until she gets straight with her PMD.  Particia Nearing, PA now in new location.  Refill sent to [facility].

## 2016-01-29 ENCOUNTER — Ambulatory Visit (INDEPENDENT_AMBULATORY_CARE_PROVIDER_SITE_OTHER): Payer: Medicare Other | Admitting: *Deleted

## 2016-01-29 DIAGNOSIS — I4891 Unspecified atrial fibrillation: Secondary | ICD-10-CM | POA: Diagnosis not present

## 2016-01-29 DIAGNOSIS — Z5181 Encounter for therapeutic drug level monitoring: Secondary | ICD-10-CM

## 2016-01-29 LAB — POCT INR: INR: 4.2

## 2016-02-12 ENCOUNTER — Ambulatory Visit (INDEPENDENT_AMBULATORY_CARE_PROVIDER_SITE_OTHER): Payer: Medicare Other | Admitting: *Deleted

## 2016-02-12 DIAGNOSIS — Z5181 Encounter for therapeutic drug level monitoring: Secondary | ICD-10-CM

## 2016-02-12 DIAGNOSIS — I4891 Unspecified atrial fibrillation: Secondary | ICD-10-CM | POA: Diagnosis not present

## 2016-02-12 LAB — POCT INR: INR: 2.4

## 2016-03-02 ENCOUNTER — Ambulatory Visit (INDEPENDENT_AMBULATORY_CARE_PROVIDER_SITE_OTHER): Payer: Medicare Other | Admitting: *Deleted

## 2016-03-02 DIAGNOSIS — I4891 Unspecified atrial fibrillation: Secondary | ICD-10-CM | POA: Diagnosis not present

## 2016-03-02 DIAGNOSIS — I70203 Unspecified atherosclerosis of native arteries of extremities, bilateral legs: Secondary | ICD-10-CM | POA: Diagnosis not present

## 2016-03-02 DIAGNOSIS — Z5181 Encounter for therapeutic drug level monitoring: Secondary | ICD-10-CM | POA: Diagnosis not present

## 2016-03-02 DIAGNOSIS — B351 Tinea unguium: Secondary | ICD-10-CM | POA: Diagnosis not present

## 2016-03-02 DIAGNOSIS — L84 Corns and callosities: Secondary | ICD-10-CM | POA: Diagnosis not present

## 2016-03-02 LAB — POCT INR: INR: 3.2

## 2016-03-03 ENCOUNTER — Other Ambulatory Visit: Payer: Self-pay | Admitting: Internal Medicine

## 2016-03-10 ENCOUNTER — Telehealth: Payer: Self-pay | Admitting: *Deleted

## 2016-03-10 NOTE — Telephone Encounter (Signed)
Pt states she might have gotten confused on how she took her warfarin.  Moved up INR appt to tomorrow 03/11/16.

## 2016-03-10 NOTE — Telephone Encounter (Signed)
Megan [name] called wanting to speak with [name] about her coumdin

## 2016-03-11 ENCOUNTER — Ambulatory Visit (INDEPENDENT_AMBULATORY_CARE_PROVIDER_SITE_OTHER): Payer: Medicare Other | Admitting: *Deleted

## 2016-03-11 DIAGNOSIS — Z5181 Encounter for therapeutic drug level monitoring: Secondary | ICD-10-CM | POA: Diagnosis not present

## 2016-03-11 DIAGNOSIS — I4891 Unspecified atrial fibrillation: Secondary | ICD-10-CM | POA: Diagnosis not present

## 2016-03-11 LAB — POCT INR: INR: 3.4

## 2016-03-25 ENCOUNTER — Ambulatory Visit (INDEPENDENT_AMBULATORY_CARE_PROVIDER_SITE_OTHER): Payer: Medicare Other | Admitting: *Deleted

## 2016-03-25 DIAGNOSIS — I4891 Unspecified atrial fibrillation: Secondary | ICD-10-CM

## 2016-03-25 DIAGNOSIS — Z5181 Encounter for therapeutic drug level monitoring: Secondary | ICD-10-CM

## 2016-03-25 LAB — POCT INR: INR: 2.5

## 2016-03-31 ENCOUNTER — Other Ambulatory Visit: Payer: Self-pay | Admitting: *Deleted

## 2016-03-31 MED ORDER — WARFARIN SODIUM 3 MG PO TABS: ORAL_TABLET | ORAL | 4 refills | Status: DC

## 2016-04-08 DIAGNOSIS — I2510 Atherosclerotic heart disease of native coronary artery without angina pectoris: Secondary | ICD-10-CM | POA: Diagnosis not present

## 2016-04-08 DIAGNOSIS — E039 Hypothyroidism, unspecified: Secondary | ICD-10-CM | POA: Diagnosis not present

## 2016-04-08 DIAGNOSIS — E789 Disorder of lipoprotein metabolism, unspecified: Secondary | ICD-10-CM | POA: Diagnosis not present

## 2016-04-08 DIAGNOSIS — I482 Chronic atrial fibrillation: Secondary | ICD-10-CM | POA: Diagnosis not present

## 2016-04-20 ENCOUNTER — Ambulatory Visit (INDEPENDENT_AMBULATORY_CARE_PROVIDER_SITE_OTHER): Payer: Medicare Other | Admitting: *Deleted

## 2016-04-20 DIAGNOSIS — Z5181 Encounter for therapeutic drug level monitoring: Secondary | ICD-10-CM | POA: Diagnosis not present

## 2016-04-20 DIAGNOSIS — I4891 Unspecified atrial fibrillation: Secondary | ICD-10-CM | POA: Diagnosis not present

## 2016-04-20 LAB — POCT INR: INR: 2.8

## 2016-05-18 ENCOUNTER — Ambulatory Visit (INDEPENDENT_AMBULATORY_CARE_PROVIDER_SITE_OTHER): Payer: Medicare Other | Admitting: *Deleted

## 2016-05-18 DIAGNOSIS — I4891 Unspecified atrial fibrillation: Secondary | ICD-10-CM

## 2016-05-18 DIAGNOSIS — Z5181 Encounter for therapeutic drug level monitoring: Secondary | ICD-10-CM | POA: Diagnosis not present

## 2016-05-18 LAB — POCT INR: INR: 3.3

## 2016-05-25 DIAGNOSIS — I70203 Unspecified atherosclerosis of native arteries of extremities, bilateral legs: Secondary | ICD-10-CM | POA: Diagnosis not present

## 2016-05-25 DIAGNOSIS — M79676 Pain in unspecified toe(s): Secondary | ICD-10-CM | POA: Diagnosis not present

## 2016-05-25 DIAGNOSIS — B351 Tinea unguium: Secondary | ICD-10-CM | POA: Diagnosis not present

## 2016-05-25 DIAGNOSIS — L84 Corns and callosities: Secondary | ICD-10-CM | POA: Diagnosis not present

## 2016-06-10 ENCOUNTER — Ambulatory Visit (INDEPENDENT_AMBULATORY_CARE_PROVIDER_SITE_OTHER): Payer: Medicare Other | Admitting: *Deleted

## 2016-06-10 DIAGNOSIS — Z5181 Encounter for therapeutic drug level monitoring: Secondary | ICD-10-CM

## 2016-06-10 DIAGNOSIS — I4891 Unspecified atrial fibrillation: Secondary | ICD-10-CM | POA: Diagnosis not present

## 2016-06-10 LAB — POCT INR: INR: 3.6

## 2016-06-29 ENCOUNTER — Ambulatory Visit (INDEPENDENT_AMBULATORY_CARE_PROVIDER_SITE_OTHER): Payer: Medicare Other | Admitting: *Deleted

## 2016-06-29 DIAGNOSIS — I4891 Unspecified atrial fibrillation: Secondary | ICD-10-CM

## 2016-06-29 DIAGNOSIS — Z5181 Encounter for therapeutic drug level monitoring: Secondary | ICD-10-CM | POA: Diagnosis not present

## 2016-06-29 LAB — POCT INR: INR: 2.7

## 2016-07-21 ENCOUNTER — Other Ambulatory Visit: Payer: Self-pay | Admitting: *Deleted

## 2016-07-21 MED ORDER — SILVER SULFADIAZINE 1 % EX CREA: 1.0000 "application " | TOPICAL_CREAM | Freq: Every day | CUTANEOUS | 0 refills | Status: DC

## 2016-07-21 NOTE — Telephone Encounter (Signed)
May refill one time, Dr. Melina Copa is out of town this week.

## 2016-07-21 NOTE — Telephone Encounter (Signed)
rx sent to pharmacy

## 2016-07-27 ENCOUNTER — Ambulatory Visit (INDEPENDENT_AMBULATORY_CARE_PROVIDER_SITE_OTHER): Payer: Medicare Other | Admitting: *Deleted

## 2016-07-27 DIAGNOSIS — Z5181 Encounter for therapeutic drug level monitoring: Secondary | ICD-10-CM | POA: Diagnosis not present

## 2016-07-27 DIAGNOSIS — I4891 Unspecified atrial fibrillation: Secondary | ICD-10-CM

## 2016-07-27 LAB — POCT INR: INR: 2.8

## 2016-08-24 ENCOUNTER — Ambulatory Visit (INDEPENDENT_AMBULATORY_CARE_PROVIDER_SITE_OTHER): Payer: Medicare Other | Admitting: *Deleted

## 2016-08-24 DIAGNOSIS — I4891 Unspecified atrial fibrillation: Secondary | ICD-10-CM

## 2016-08-24 DIAGNOSIS — Z5181 Encounter for therapeutic drug level monitoring: Secondary | ICD-10-CM | POA: Diagnosis not present

## 2016-08-24 LAB — POCT INR: INR: 2.2

## 2016-08-27 ENCOUNTER — Encounter: Payer: Medicare Other | Admitting: Internal Medicine

## 2016-09-16 ENCOUNTER — Ambulatory Visit (INDEPENDENT_AMBULATORY_CARE_PROVIDER_SITE_OTHER): Payer: Medicare Other | Admitting: *Deleted

## 2016-09-16 DIAGNOSIS — I4891 Unspecified atrial fibrillation: Secondary | ICD-10-CM | POA: Diagnosis not present

## 2016-09-16 DIAGNOSIS — Z5181 Encounter for therapeutic drug level monitoring: Secondary | ICD-10-CM

## 2016-09-16 LAB — POCT INR: INR: 2.8

## 2016-09-17 ENCOUNTER — Encounter: Payer: Medicare Other | Admitting: Internal Medicine

## 2016-09-25 DIAGNOSIS — N3001 Acute cystitis with hematuria: Secondary | ICD-10-CM | POA: Diagnosis not present

## 2016-09-25 DIAGNOSIS — R399 Unspecified symptoms and signs involving the genitourinary system: Secondary | ICD-10-CM | POA: Diagnosis not present

## 2016-10-03 ENCOUNTER — Other Ambulatory Visit: Payer: Self-pay | Admitting: Internal Medicine

## 2016-10-05 ENCOUNTER — Ambulatory Visit (INDEPENDENT_AMBULATORY_CARE_PROVIDER_SITE_OTHER): Payer: Medicare Other | Admitting: *Deleted

## 2016-10-05 DIAGNOSIS — I4891 Unspecified atrial fibrillation: Secondary | ICD-10-CM

## 2016-10-05 DIAGNOSIS — Z5181 Encounter for therapeutic drug level monitoring: Secondary | ICD-10-CM | POA: Diagnosis not present

## 2016-10-05 LAB — POCT INR: INR: 3

## 2016-10-19 ENCOUNTER — Encounter: Payer: Self-pay | Admitting: *Deleted

## 2016-10-22 ENCOUNTER — Ambulatory Visit (INDEPENDENT_AMBULATORY_CARE_PROVIDER_SITE_OTHER): Payer: Medicare Other | Admitting: Internal Medicine

## 2016-10-22 ENCOUNTER — Encounter: Payer: Self-pay | Admitting: Internal Medicine

## 2016-10-22 VITALS — BP 120/88 | HR 80 | Ht 61.0 in | Wt 285.0 lb

## 2016-10-22 DIAGNOSIS — I4821 Permanent atrial fibrillation: Secondary | ICD-10-CM

## 2016-10-22 DIAGNOSIS — I482 Chronic atrial fibrillation: Secondary | ICD-10-CM

## 2016-10-22 DIAGNOSIS — I495 Sick sinus syndrome: Secondary | ICD-10-CM | POA: Diagnosis not present

## 2016-10-22 LAB — CUP PACEART INCLINIC DEVICE CHECK
Battery Remaining Longevity: 104 mo
Battery Voltage: 2.93 V
Brady Statistic RA Percent Paced: 0 %
Brady Statistic RV Percent Paced: 35 %
Implantable Lead Implant Date: 20100728
Implantable Lead Location: 753859
Lead Channel Impedance Value: 387.5 Ohm
Lead Channel Pacing Threshold Amplitude: 0.75 V
Lead Channel Sensing Intrinsic Amplitude: 1.6 mV
Lead Channel Sensing Intrinsic Amplitude: 11.2 mV
Lead Channel Setting Pacing Amplitude: 2.5 V
Lead Channel Setting Pacing Pulse Width: 0.4 ms
Lead Channel Setting Sensing Sensitivity: 2 mV
MDC IDC LEAD IMPLANT DT: 20100728
MDC IDC LEAD LOCATION: 753860
MDC IDC MSMT LEADCHNL RV PACING THRESHOLD AMPLITUDE: 0.75 V
MDC IDC MSMT LEADCHNL RV PACING THRESHOLD PULSEWIDTH: 0.4 ms
MDC IDC MSMT LEADCHNL RV PACING THRESHOLD PULSEWIDTH: 0.4 ms
MDC IDC PG IMPLANT DT: 20100728
MDC IDC PG SERIAL: 2339600
MDC IDC SESS DTM: 20180727151640

## 2016-10-22 NOTE — Patient Instructions (Signed)
Medication Instructions:   Your physician recommends that you continue on your current medications as directed. Please refer to the Current Medication list given to you today.  Labwork:  NONE  Testing/Procedures:  NONE  Follow-Up: Your physician recommends that you schedule a follow-up appointment in: 1 year. Please schedule this appointment today before leaving the office.  Any Other Special Instructions Will Be Listed Below (If Applicable).  Your next device check from home is on 01/24/17.  If you need a refill on your cardiac medications before your next appointment, please call your pharmacy.

## 2016-10-22 NOTE — Progress Notes (Signed)
PCP: Octavio Graves, DO  Megan [name] is a 71 y.o. female who presents today for routine electrophysiology followup.  Since last being seen in our clinic, the patient reports doing reasonably well.  Today, she denies symptoms of palpitations, chest pain, shortness of breath,  lower extremity edema, dizziness, presyncope, or syncope.  The patient is otherwise without complaint today.   Past Medical History:  Diagnosis Date  . Acute diastolic heart failure ([place])   . Anxiety state, unspecified   . Congestive heart failure, unspecified   . Degenerative joint disease   . GERD (gastroesophageal reflux disease)   . Hyperlipidemia   . Hypertension   . Lymphedema   . Obesity   . Persistent atrial fibrillation (HCC)     post-termination pauses with afib s/p PPM  . Presence of permanent cardiac pacemaker   . Sinoatrial node dysfunction (HCC)    s/p PPM  . Sleep apnea    compliant with CPAP  . Unspecified hypothyroidism   . Unspecified venous (peripheral) insufficiency    Past Surgical History:  Procedure Laterality Date  . CATARACT EXTRACTION W/PHACO Left 09/09/2014   Procedure: CATARACT EXTRACTION PHACO AND INTRAOCULAR LENS PLACEMENT (IOC);  Surgeon: Tonny Branch, MD;  Location: [facility];  Service: Ophthalmology;  Laterality: Left;  CDE: 6.99  . CATARACT EXTRACTION W/PHACO Right 10/07/2014   Procedure: CATARACT EXTRACTION PHACO AND INTRAOCULAR LENS PLACEMENT RIGHT EYE;  Surgeon: Tonny Branch, MD;  Location: [facility];  Service: Ophthalmology;  Laterality: Right;  CDE:5.16  . PACEMAKER INSERTION     SJM by JA    ROS- all systems are reviewed and negative except as per HPI above  Current Outpatient Prescriptions  Medication Sig Dispense Refill  . acetaminophen (TYLENOL) 500 MG tablet Take 500 mg by mouth every 6 (six) hours as needed for mild pain or moderate pain.    Marland Kitchen ALPRAZolam (XANAX) 0.25 MG tablet Take 0.25 mg by mouth at bedtime as needed for anxiety or sleep.     Marland Kitchen atorvastatin  (LIPITOR) 40 MG tablet Take 1 tablet (40 mg total) by mouth daily. 60 tablet 0  . clotrimazole-betamethasone (LOTRISONE) cream Apply 1 application topically daily as needed (for affected area(S)).    . diltiazem (CARDIZEM CD) 240 MG 24 hr capsule Take 240 mg by mouth daily.    Marland Kitchen esomeprazole (NEXIUM) 40 MG capsule Take 40 mg by mouth daily as needed (acid reflux).     Marland Kitchen levothyroxine (SYNTHROID, LEVOTHROID) 150 MCG tablet Take 150 mcg by mouth every morning.     . metoprolol (LOPRESSOR) 100 MG tablet TAKE ONE-HALF TABLET BY MOUTH TWICE DAILY 30 tablet 6  . nystatin cream (MYCOSTATIN) Apply 1 application topically 2 (two) times daily. To affected areas 30 g 0  . silver sulfADIAZINE (SILVADENE) 1 % cream Apply 1 application topically daily. 50 g 0  . Vitamin D, Ergocalciferol, (DRISDOL) 50000 units CAPS capsule Take 1 capsule (50,000 Units total) by mouth every 7 (seven) days. Takes on Thursdays. 12 capsule 3  . warfarin (COUMADIN) 3 MG tablet Take 1 tablet daily except 1 1/2 tablets on Sundays, Tuesdays and Thursdays 45 tablet 4   No current facility-administered medications for this visit.     Physical Exam: Vitals:   10/22/16 1337  BP: 120/88  Pulse: 80  SpO2: 98%  Weight: 285 lb (129.3 kg)  Height: 5\' 1"  (1.549 m)    GEN- The patient is well appearing, alert and oriented x 3 today.   Head- normocephalic, atraumatic Eyes-  Sclera clear, conjunctiva pink Ears- hearing intact Oropharynx- clear Lungs- Clear to ausculation bilaterally, normal work of breathing Chest- pacemaker pocket is well healed Heart- irregular rate and rhythm, no murmurs, rubs or gallops, PMI not laterally displaced GI- soft, NT, ND, + BS Extremities- no clubbing, cyanosis, or edema  Pacemaker interrogation- reviewed in detail today,  See PACEART report  Assessment and Plan:  1. Bradycardia/tachycardia Normal pacemaker function See Pace Art report No changes today  2. Permanent atrial  fibrillation Stable No change required today On coumadin  3. Obesity Body mass index is 53.85 kg/m. Continues to be a struggle for her I have again encouraged bariatric surgical referral which she declines  4. HTN Stable No change required today  She is now willing to consider remote monitoring Return in 1 year  Thompson Grayer MD, [facility] 10/22/2016 2:11 PM

## 2016-10-27 DIAGNOSIS — H26493 Other secondary cataract, bilateral: Secondary | ICD-10-CM | POA: Diagnosis not present

## 2016-10-27 DIAGNOSIS — Z961 Presence of intraocular lens: Secondary | ICD-10-CM | POA: Diagnosis not present

## 2016-10-27 DIAGNOSIS — H04123 Dry eye syndrome of bilateral lacrimal glands: Secondary | ICD-10-CM | POA: Diagnosis not present

## 2016-10-27 DIAGNOSIS — H353131 Nonexudative age-related macular degeneration, bilateral, early dry stage: Secondary | ICD-10-CM | POA: Diagnosis not present

## 2016-11-02 ENCOUNTER — Ambulatory Visit (INDEPENDENT_AMBULATORY_CARE_PROVIDER_SITE_OTHER): Payer: Medicare Other | Admitting: *Deleted

## 2016-11-02 DIAGNOSIS — I4891 Unspecified atrial fibrillation: Secondary | ICD-10-CM

## 2016-11-02 DIAGNOSIS — Z5181 Encounter for therapeutic drug level monitoring: Secondary | ICD-10-CM | POA: Diagnosis not present

## 2016-11-02 LAB — POCT INR: INR: 2.9

## 2016-11-11 DIAGNOSIS — B351 Tinea unguium: Secondary | ICD-10-CM | POA: Diagnosis not present

## 2016-11-11 DIAGNOSIS — M79676 Pain in unspecified toe(s): Secondary | ICD-10-CM | POA: Diagnosis not present

## 2016-11-30 ENCOUNTER — Ambulatory Visit (INDEPENDENT_AMBULATORY_CARE_PROVIDER_SITE_OTHER): Payer: Medicare Other | Admitting: *Deleted

## 2016-11-30 DIAGNOSIS — Z5181 Encounter for therapeutic drug level monitoring: Secondary | ICD-10-CM | POA: Diagnosis not present

## 2016-11-30 DIAGNOSIS — I4891 Unspecified atrial fibrillation: Secondary | ICD-10-CM | POA: Diagnosis not present

## 2016-11-30 LAB — POCT INR: INR: 2.1

## 2016-12-28 ENCOUNTER — Ambulatory Visit (INDEPENDENT_AMBULATORY_CARE_PROVIDER_SITE_OTHER): Payer: Medicare Other | Admitting: *Deleted

## 2016-12-28 DIAGNOSIS — Z5181 Encounter for therapeutic drug level monitoring: Secondary | ICD-10-CM | POA: Diagnosis not present

## 2016-12-28 DIAGNOSIS — I4891 Unspecified atrial fibrillation: Secondary | ICD-10-CM | POA: Diagnosis not present

## 2016-12-28 LAB — POCT INR: INR: 2.4

## 2017-01-19 ENCOUNTER — Other Ambulatory Visit: Payer: Self-pay | Admitting: Physician Assistant

## 2017-01-25 ENCOUNTER — Ambulatory Visit (INDEPENDENT_AMBULATORY_CARE_PROVIDER_SITE_OTHER): Payer: Medicare Other | Admitting: *Deleted

## 2017-01-25 DIAGNOSIS — I4891 Unspecified atrial fibrillation: Secondary | ICD-10-CM | POA: Diagnosis not present

## 2017-01-25 DIAGNOSIS — Z5181 Encounter for therapeutic drug level monitoring: Secondary | ICD-10-CM

## 2017-01-25 LAB — POCT INR: INR: 2.6

## 2017-01-26 ENCOUNTER — Ambulatory Visit (INDEPENDENT_AMBULATORY_CARE_PROVIDER_SITE_OTHER): Payer: Medicare Other | Admitting: Physician Assistant

## 2017-01-26 ENCOUNTER — Encounter: Payer: Self-pay | Admitting: Physician Assistant

## 2017-01-26 VITALS — BP 135/81 | HR 78 | Temp 98.0°F | Ht 61.0 in | Wt 293.0 lb

## 2017-01-26 DIAGNOSIS — K219 Gastro-esophageal reflux disease without esophagitis: Secondary | ICD-10-CM

## 2017-01-26 DIAGNOSIS — Z7901 Long term (current) use of anticoagulants: Secondary | ICD-10-CM | POA: Diagnosis not present

## 2017-01-26 DIAGNOSIS — R7309 Other abnormal glucose: Secondary | ICD-10-CM | POA: Diagnosis not present

## 2017-01-26 DIAGNOSIS — I4891 Unspecified atrial fibrillation: Secondary | ICD-10-CM

## 2017-01-26 DIAGNOSIS — Z23 Encounter for immunization: Secondary | ICD-10-CM

## 2017-01-26 DIAGNOSIS — E039 Hypothyroidism, unspecified: Secondary | ICD-10-CM | POA: Diagnosis not present

## 2017-01-26 DIAGNOSIS — I739 Peripheral vascular disease, unspecified: Secondary | ICD-10-CM | POA: Diagnosis not present

## 2017-01-26 DIAGNOSIS — I890 Lymphedema, not elsewhere classified: Secondary | ICD-10-CM

## 2017-01-26 DIAGNOSIS — I495 Sick sinus syndrome: Secondary | ICD-10-CM

## 2017-01-26 DIAGNOSIS — I10 Essential (primary) hypertension: Secondary | ICD-10-CM

## 2017-01-26 DIAGNOSIS — E782 Mixed hyperlipidemia: Secondary | ICD-10-CM

## 2017-01-26 DIAGNOSIS — G4733 Obstructive sleep apnea (adult) (pediatric): Secondary | ICD-10-CM | POA: Diagnosis not present

## 2017-01-26 LAB — BAYER DCA HB A1C WAIVED: HB A1C (BAYER DCA - WAIVED): 6.1 % (ref <7.0)

## 2017-01-26 MED ORDER — DILTIAZEM HCL ER COATED BEADS 240 MG PO CP24: 240.0000 mg | ORAL_CAPSULE | Freq: Every day | ORAL | 3 refills | Status: DC

## 2017-01-26 NOTE — Progress Notes (Signed)
 BP 135/81   Pulse 78   Temp 98 F (36.7 C) (Oral)   Ht 5' 1" (1.549 m)   Wt 293 lb (132.9 kg)   BMI 55.36 kg/m    Subjective:    Patient ID: Megan [name], female    DOB: 08/25/1945, 71 y.o.   MRN: [id number]  HPI: Megan [name] is a 71 y.o. female presenting on 01/26/2017 for New Patient (Initial Visit) (Previous patient ) and Establish Care  This patient comes into the reestablish with me.  I had seen her over the years but it has been a year since I have seen her.  She is currently a patient of Dr. Allred's.  She has her Coumadin followed there for her atrial fibrillation and Coumadin therapy.  She also has hypertension, peripheral vascular disease history of tachycardia-bradycardia, history of OSA, chronic lymphedema in which she wears leg braces, GERD, hypothyroidism, morbid obesity, hyperlipidemia.  We will draw labs today to get a baseline of where she is right now.  She states that most of her medications are current.  She needs a couple of them filled.  She does get several of them through an indigent program.  I have completed a form today for her diltiazem for the next year.  She also is having difficulty with her mobility.  We are going to have her come back in a few weeks to have a face-to-face in order to qualify her for a large size wheelchair and walker.  She spoke with the pharmacy and they said that Medicare does not usually pay for a shower seat.  She is going to go ahead and buy 1 of those soon.  Relevant past medical, surgical, family and social history reviewed and updated as indicated. Allergies and medications reviewed and updated.  Past Medical History:  Diagnosis Date  . Acute diastolic heart failure (HCC)   . Anxiety state, unspecified   . Congestive heart failure, unspecified   . Degenerative joint disease   . GERD (gastroesophageal reflux disease)   . Hyperlipidemia   . Hypertension   . Lymphedema   . Obesity   . Persistent atrial fibrillation (HCC)      post-termination pauses with afib s/p PPM  . Presence of permanent cardiac pacemaker   . Sinoatrial node dysfunction (HCC)    s/p PPM  . Sleep apnea    compliant with CPAP  . Unspecified hypothyroidism   . Unspecified venous (peripheral) insufficiency     Past Surgical History:  Procedure Laterality Date  . CATARACT EXTRACTION W/PHACO Left 09/09/2014   Procedure: CATARACT EXTRACTION PHACO AND INTRAOCULAR LENS PLACEMENT (IOC);  Surgeon: Kerry Hunt, MD;  Location: [facility];  Service: Ophthalmology;  Laterality: Left;  CDE: 6.99  . CATARACT EXTRACTION W/PHACO Right 10/07/2014   Procedure: CATARACT EXTRACTION PHACO AND INTRAOCULAR LENS PLACEMENT RIGHT EYE;  Surgeon: Kerry Hunt, MD;  Location: [facility];  Service: Ophthalmology;  Laterality: Right;  CDE:5.16  . PACEMAKER INSERTION     SJM by JA    Review of Systems  Constitutional: Positive for fatigue. Negative for activity change, appetite change and fever.  HENT: Negative.   Eyes: Negative.   Respiratory: Negative.  Negative for cough, shortness of breath and wheezing.   Cardiovascular: Positive for palpitations and leg swelling. Negative for chest pain.  Gastrointestinal: Negative.  Negative for abdominal pain, constipation, diarrhea, nausea and vomiting.  Endocrine: Negative.  Negative for cold intolerance and heat intolerance.  Genitourinary: Negative.  Negative for dysuria.    Musculoskeletal: Positive for arthralgias and myalgias.  Skin: Negative.  Negative for color change.  Neurological: Negative.  Negative for dizziness, tremors and light-headedness.  Psychiatric/Behavioral: The patient is nervous/anxious.     Allergies as of 01/26/2017   No Known Allergies     Medication List       Accurate as of 01/26/17 12:59 PM. Always use your most recent med list.          acetaminophen 500 MG tablet Commonly known as:  TYLENOL Take 500 mg by mouth every 6 (six) hours as needed for mild pain or moderate pain.   ALPRAZolam  0.25 MG tablet Commonly known as:  XANAX Take 0.25 mg by mouth at bedtime as needed for anxiety or sleep.   atorvastatin 40 MG tablet Commonly known as:  LIPITOR Take 1 tablet (40 mg total) by mouth daily.   clotrimazole-betamethasone cream Commonly known as:  LOTRISONE Apply 1 application topically daily as needed (for affected area(S)).   diltiazem 240 MG 24 hr capsule Commonly known as:  CARDIZEM CD Take 1 capsule (240 mg total) by mouth daily.   esomeprazole 40 MG capsule Commonly known as:  NEXIUM Take 40 mg by mouth daily as needed (acid reflux).   levothyroxine 150 MCG tablet Commonly known as:  SYNTHROID, LEVOTHROID Take 150 mcg by mouth every morning.   metoprolol tartrate 100 MG tablet Commonly known as:  LOPRESSOR TAKE ONE-HALF TABLET BY MOUTH TWICE DAILY   nystatin cream Commonly known as:  MYCOSTATIN Apply 1 application topically 2 (two) times daily. To affected areas   silver sulfADIAZINE 1 % cream Commonly known as:  SILVADENE Apply 1 application topically daily.   Vitamin D (Ergocalciferol) 50000 units Caps capsule Commonly known as:  DRISDOL Take 1 capsule (50,000 Units total) by mouth every 7 (seven) days. Takes on Thursdays.   warfarin 3 MG tablet Commonly known as:  COUMADIN Take 1 tablet daily except 1 1/2 tablets on Sundays, Tuesdays and Thursdays          Objective:    BP 135/81   Pulse 78   Temp 98 F (36.7 C) (Oral)   Ht 5' 1" (1.549 m)   Wt 293 lb (132.9 kg)   BMI 55.36 kg/m   No Known Allergies  Physical Exam  Constitutional: She is oriented to person, place, and time. She appears well-developed and well-nourished.  Obese female  HENT:  Head: Normocephalic and atraumatic.  Right Ear: Tympanic membrane, external ear and ear canal normal.  Left Ear: Tympanic membrane, external ear and ear canal normal.  Nose: Nose normal. No rhinorrhea.  Mouth/Throat: Oropharynx is clear and moist and mucous membranes are normal. No  oropharyngeal exudate or posterior oropharyngeal erythema.  Eyes: Pupils are equal, round, and reactive to light. Conjunctivae and EOM are normal.  Neck: Normal range of motion. Neck supple.  Cardiovascular: Normal rate, regular rhythm, normal heart sounds and intact distal pulses.   Pulmonary/Chest: Effort normal and breath sounds normal.  Abdominal: Soft. Bowel sounds are normal.  Musculoskeletal:       Right hip: She exhibits decreased range of motion.       Left hip: She exhibits decreased range of motion.       Right knee: She exhibits decreased range of motion. Tenderness found.       Left knee: She exhibits decreased range of motion. Tenderness found.  Lymphadenopathy:  Chronically enlarged lower legs, braces in place today for compression.  Neurological: She is alert and oriented  to person, place, and time. She has normal reflexes.  Skin: Skin is warm and dry. No rash noted.  Psychiatric: She has a normal mood and affect. Her behavior is normal. Judgment and thought content normal.    Results for orders placed or performed in visit on 01/25/17  POCT INR  Result Value Ref Range   INR 2.6       Assessment & Plan:   1. Atrial fibrillation, unspecified type ([place])  2. HYPERTENSION, BENIGN - CBC with Differential/Platelet - CMP14+EGFR - Lipid panel - TSH - Bayer DCA Hb A1c Waived  3. PVD (peripheral vascular disease) with claudication ([place])  4. Tachycardia-bradycardia syndrome ([place])  5. OSA (obstructive sleep apnea)  6. Lymphedema  7. Gastroesophageal reflux disease without esophagitis  8. Acquired hypothyroidism - TSH  9. Long term (current) use of anticoagulants  10. Morbid obesity due to excess calories (HCC) - CBC with Differential/Platelet - CMP14+EGFR - Lipid panel - TSH - Bayer DCA Hb A1c Waived  11. Mixed hyperlipidemia - CBC with Differential/Platelet - CMP14+EGFR - Lipid panel - TSH - Bayer DCA Hb A1c Waived    Current Outpatient  Prescriptions:  .  acetaminophen (TYLENOL) 500 MG tablet, Take 500 mg by mouth every 6 (six) hours as needed for mild pain or moderate pain., Disp: , Rfl:  .  ALPRAZolam (XANAX) 0.25 MG tablet, Take 0.25 mg by mouth at bedtime as needed for anxiety or sleep. , Disp: , Rfl:  .  atorvastatin (LIPITOR) 40 MG tablet, Take 1 tablet (40 mg total) by mouth daily., Disp: 60 tablet, Rfl: 0 .  clotrimazole-betamethasone (LOTRISONE) cream, Apply 1 application topically daily as needed (for affected area(S))., Disp: , Rfl:  .  diltiazem (CARDIZEM CD) 240 MG 24 hr capsule, Take 1 capsule (240 mg total) by mouth daily., Disp: 90 capsule, Rfl: 3 .  esomeprazole (NEXIUM) 40 MG capsule, Take 40 mg by mouth daily as needed (acid reflux). , Disp: , Rfl:  .  levothyroxine (SYNTHROID, LEVOTHROID) 150 MCG tablet, Take 150 mcg by mouth every morning. , Disp: , Rfl:  .  metoprolol (LOPRESSOR) 100 MG tablet, TAKE ONE-HALF TABLET BY MOUTH TWICE DAILY, Disp: 30 tablet, Rfl: 6 .  silver sulfADIAZINE (SILVADENE) 1 % cream, Apply 1 application topically daily., Disp: 50 g, Rfl: 0 .  Vitamin D, Ergocalciferol, (DRISDOL) 50000 units CAPS capsule, Take 1 capsule (50,000 Units total) by mouth every 7 (seven) days. Takes on Thursdays., Disp: 12 capsule, Rfl: 3 .  warfarin (COUMADIN) 3 MG tablet, Take 1 tablet daily except 1 1/2 tablets on Sundays, Tuesdays and Thursdays, Disp: 45 tablet, Rfl: 4 .  nystatin cream (MYCOSTATIN), Apply 1 application topically 2 (two) times daily. To affected areas (Patient not taking: Reported on 01/26/2017), Disp: 30 g, Rfl: 0 Continue all other maintenance medications as listed above.  Follow up plan: Return in about 3 weeks (around 02/16/2017) for face to face equipment.  Educational handout given for [place] PA-C [facility] [address]  [place], [place] [address] [phone]   01/26/2017, 12:59 PM

## 2017-01-26 NOTE — Patient Instructions (Signed)
In a few days you may receive a survey in the mail or online from [employer] regarding your visit with us today. Please take a moment to fill this out. Your feedback is very important to our whole office. It can help us better understand your needs as well as improve your experience and satisfaction. Thank you for taking your time to complete it. We care about you.  Demitrus Francisco, PA-C  

## 2017-01-27 LAB — LIPID PANEL
CHOL/HDL RATIO: 4.1 ratio (ref 0.0–4.4)
CHOLESTEROL TOTAL: 244 mg/dL — AB (ref 100–199)
HDL: 60 mg/dL (ref >39)
LDL CALC: 149 mg/dL — AB (ref 0–99)
TRIGLYCERIDES: 177 mg/dL — AB (ref 0–149)
VLDL Cholesterol Cal: 35 mg/dL (ref 5–40)

## 2017-01-27 LAB — CBC WITH DIFFERENTIAL/PLATELET
Basophils Absolute: 0 10*3/uL (ref 0.0–0.2)
Basos: 0 %
EOS (ABSOLUTE): 0.1 10*3/uL (ref 0.0–0.4)
EOS: 0 %
Hematocrit: 45.1 % (ref 34.0–46.6)
Hemoglobin: 14.6 g/dL (ref 11.1–15.9)
IMMATURE GRANULOCYTES: 1 %
Immature Grans (Abs): 0.1 10*3/uL (ref 0.0–0.1)
LYMPHS ABS: 1.6 10*3/uL (ref 0.7–3.1)
Lymphs: 14 %
MCH: 32.2 pg (ref 26.6–33.0)
MCHC: 32.4 g/dL (ref 31.5–35.7)
MCV: 99 fL — ABNORMAL HIGH (ref 79–97)
MONOCYTES: 6 %
Monocytes Absolute: 0.7 10*3/uL (ref 0.1–0.9)
Neutrophils Absolute: 9.1 10*3/uL — ABNORMAL HIGH (ref 1.4–7.0)
Neutrophils: 79 %
PLATELETS: 284 10*3/uL (ref 150–379)
RBC: 4.54 x10E6/uL (ref 3.77–5.28)
RDW: 14.5 % (ref 12.3–15.4)
WBC: 11.5 10*3/uL — AB (ref 3.4–10.8)

## 2017-01-27 LAB — CMP14+EGFR
ALK PHOS: 146 IU/L — AB (ref 39–117)
ALT: 21 IU/L (ref 0–32)
AST: 14 IU/L (ref 0–40)
Albumin/Globulin Ratio: 1.5 (ref 1.2–2.2)
Albumin: 4.3 g/dL (ref 3.5–4.8)
BUN/Creatinine Ratio: 23 (ref 12–28)
BUN: 23 mg/dL (ref 8–27)
Bilirubin Total: 1.1 mg/dL (ref 0.0–1.2)
CALCIUM: 9.5 mg/dL (ref 8.7–10.3)
CO2: 19 mmol/L — AB (ref 20–29)
CREATININE: 0.98 mg/dL (ref 0.57–1.00)
Chloride: 102 mmol/L (ref 96–106)
GFR calc Af Amer: 67 mL/min/{1.73_m2} (ref >59)
GFR, EST NON AFRICAN AMERICAN: 58 mL/min/{1.73_m2} — AB (ref >59)
GLOBULIN, TOTAL: 2.8 g/dL (ref 1.5–4.5)
GLUCOSE: 118 mg/dL — AB (ref 65–99)
Potassium: 4.3 mmol/L (ref 3.5–5.2)
SODIUM: 143 mmol/L (ref 134–144)
Total Protein: 7.1 g/dL (ref 6.0–8.5)

## 2017-01-27 LAB — TSH: TSH: 1.56 u[IU]/mL (ref 0.450–4.500)

## 2017-02-01 ENCOUNTER — Other Ambulatory Visit: Payer: Self-pay | Admitting: Physician Assistant

## 2017-02-01 ENCOUNTER — Telehealth: Payer: Self-pay | Admitting: Physician Assistant

## 2017-02-01 MED ORDER — ATORVASTATIN CALCIUM 40 MG PO TABS: 40.0000 mg | ORAL_TABLET | Freq: Every day | ORAL | 0 refills | Status: DC

## 2017-02-01 MED ORDER — SULFAMETHOXAZOLE-TRIMETHOPRIM 400-80 MG PO TABS: 1.0000 | ORAL_TABLET | Freq: Two times a day (BID) | ORAL | 0 refills | Status: DC

## 2017-02-01 NOTE — Telephone Encounter (Signed)
rx rf sent

## 2017-02-01 NOTE — Telephone Encounter (Signed)
Patient aware that rx sent to pharmacy. 

## 2017-02-04 ENCOUNTER — Telehealth: Payer: Self-pay | Admitting: Physician Assistant

## 2017-02-04 NOTE — Telephone Encounter (Signed)
Will need to be seen and have urine checked

## 2017-02-04 NOTE — Telephone Encounter (Signed)
Pt aware.

## 2017-02-05 ENCOUNTER — Ambulatory Visit: Payer: Medicare Other | Admitting: Family Medicine

## 2017-02-10 ENCOUNTER — Ambulatory Visit (INDEPENDENT_AMBULATORY_CARE_PROVIDER_SITE_OTHER): Payer: Medicare Other | Admitting: *Deleted

## 2017-02-10 DIAGNOSIS — I4891 Unspecified atrial fibrillation: Secondary | ICD-10-CM | POA: Diagnosis not present

## 2017-02-10 DIAGNOSIS — Z5181 Encounter for therapeutic drug level monitoring: Secondary | ICD-10-CM

## 2017-02-10 LAB — POCT INR: INR: 3.8

## 2017-02-21 ENCOUNTER — Encounter: Payer: Self-pay | Admitting: Physician Assistant

## 2017-02-21 ENCOUNTER — Ambulatory Visit (INDEPENDENT_AMBULATORY_CARE_PROVIDER_SITE_OTHER): Payer: Medicare Other | Admitting: Physician Assistant

## 2017-02-21 VITALS — BP 146/90 | HR 92 | Temp 96.8°F | Ht 61.0 in | Wt 291.0 lb

## 2017-02-21 DIAGNOSIS — N3281 Overactive bladder: Secondary | ICD-10-CM | POA: Diagnosis not present

## 2017-02-21 DIAGNOSIS — R300 Dysuria: Secondary | ICD-10-CM | POA: Diagnosis not present

## 2017-02-21 DIAGNOSIS — I5023 Acute on chronic systolic (congestive) heart failure: Secondary | ICD-10-CM

## 2017-02-21 DIAGNOSIS — R0601 Orthopnea: Secondary | ICD-10-CM

## 2017-02-21 DIAGNOSIS — I4891 Unspecified atrial fibrillation: Secondary | ICD-10-CM | POA: Diagnosis not present

## 2017-02-21 DIAGNOSIS — I890 Lymphedema, not elsewhere classified: Secondary | ICD-10-CM

## 2017-02-21 MED ORDER — HYDROCHLOROTHIAZIDE 25 MG PO TABS: 25.0000 mg | ORAL_TABLET | Freq: Every day | ORAL | 0 refills | Status: DC

## 2017-02-21 MED ORDER — LEVOTHYROXINE SODIUM 150 MCG PO TABS: 150.0000 ug | ORAL_TABLET | Freq: Every morning | ORAL | 3 refills | Status: DC

## 2017-02-21 MED ORDER — SULFAMETHOXAZOLE-TRIMETHOPRIM 400-80 MG PO TABS: 1.0000 | ORAL_TABLET | Freq: Two times a day (BID) | ORAL | 0 refills | Status: DC

## 2017-02-21 MED ORDER — ATORVASTATIN CALCIUM 80 MG PO TABS: 80.0000 mg | ORAL_TABLET | Freq: Every day | ORAL | 3 refills | Status: DC

## 2017-02-21 NOTE — Patient Instructions (Signed)
In a few days you may receive a survey in the mail or online from [employer] regarding your visit with us today. Please take a moment to fill this out. Your feedback is very important to our whole office. It can help us better understand your needs as well as improve your experience and satisfaction. Thank you for taking your time to complete it. We care about you.  Smt. Loder, PA-C  

## 2017-02-22 ENCOUNTER — Other Ambulatory Visit: Payer: Self-pay | Admitting: *Deleted

## 2017-02-22 DIAGNOSIS — N3281 Overactive bladder: Secondary | ICD-10-CM | POA: Insufficient documentation

## 2017-02-22 MED ORDER — VITAMIN D (ERGOCALCIFEROL) 1.25 MG (50000 UNIT) PO CAPS
50000.0000 [IU] | ORAL_CAPSULE | ORAL | 3 refills | Status: DC
Start: 2017-02-22 — End: 2018-05-01

## 2017-02-22 NOTE — Progress Notes (Signed)
BP (!) 146/90   Pulse 92   Temp (!) 96.8 F (36 C) (Oral)   Ht 5\' 1"  (1.549 m)   Wt 291 lb (132 kg)   BMI 54.98 kg/m    Subjective:    Patient ID: Megan [name], female    DOB: 05/08/1945, 71 y.o.   MRN: [id number]  HPI: Megan [name] is a 71 y.o. female presenting on 02/21/2017 for Follow-up (review labs and refill meds)  For her to get a wheelchair and seated walker.  Her primary diagnoses include atrial fibrillation, acute on chronic heart failure, chronic lymphedema of lower legs.  She needs a wheelchair to help with getting around for shopping and household chores.  She is morbidly obese at 291 pounds.  An oversized wheelchair would be needed.  Also due to her lymphedema a wheelchair with leg rests as needed.  Order for wheeled walker with seat will be ordered also.  She is also had another recurrence of urine frequency and burning.  She goes frequently and will sometimes have leakage.  She is to see a urologist but has not gone to one in a couple of years.  Her urine was treated a few weeks ago and she states that is feeling better but the symptoms have continued.  Relevant past medical, surgical, family and social history reviewed and updated as indicated. Allergies and medications reviewed and updated.  Past Medical History:  Diagnosis Date  . Acute diastolic heart failure ([place])   . Anxiety state, unspecified   . Congestive heart failure, unspecified   . Degenerative joint disease   . GERD (gastroesophageal reflux disease)   . Hyperlipidemia   . Hypertension   . Lymphedema   . Obesity   . Persistent atrial fibrillation (HCC)     post-termination pauses with afib s/p PPM  . Presence of permanent cardiac pacemaker   . Sinoatrial node dysfunction (HCC)    s/p PPM  . Sleep apnea    compliant with CPAP  . Unspecified hypothyroidism   . Unspecified venous (peripheral) insufficiency     Past Surgical History:  Procedure Laterality Date  . CATARACT EXTRACTION  W/PHACO Left 09/09/2014   Procedure: CATARACT EXTRACTION PHACO AND INTRAOCULAR LENS PLACEMENT (IOC);  Surgeon: Tonny Branch, MD;  Location: [facility];  Service: Ophthalmology;  Laterality: Left;  CDE: 6.99  . CATARACT EXTRACTION W/PHACO Right 10/07/2014   Procedure: CATARACT EXTRACTION PHACO AND INTRAOCULAR LENS PLACEMENT RIGHT EYE;  Surgeon: Tonny Branch, MD;  Location: [facility];  Service: Ophthalmology;  Laterality: Right;  CDE:5.16  . PACEMAKER INSERTION     SJM by JA    Review of Systems  Constitutional: Negative.  Negative for activity change, fatigue and fever.  HENT: Negative.   Eyes: Negative.   Respiratory: Negative.  Negative for cough.   Cardiovascular: Positive for palpitations and leg swelling. Negative for chest pain.  Gastrointestinal: Negative.  Negative for abdominal pain.  Endocrine: Negative.   Genitourinary: Negative.  Negative for dysuria.  Musculoskeletal: Positive for arthralgias and back pain.  Skin: Negative.   Neurological: Negative.     Allergies as of 02/21/2017   No Known Allergies     Medication List        Accurate as of 02/21/17 11:59 PM. Always use your most recent med list.          acetaminophen 500 MG tablet Commonly known as:  TYLENOL Take 500 mg by mouth every 6 (six) hours as needed for mild pain or  moderate pain.   ALPRAZolam 0.25 MG tablet Commonly known as:  XANAX Take 0.25 mg by mouth at bedtime as needed for anxiety or sleep.   atorvastatin 80 MG tablet Commonly known as:  LIPITOR Take 1 tablet (80 mg total) by mouth daily.   clotrimazole-betamethasone cream Commonly known as:  LOTRISONE Apply 1 application topically daily as needed (for affected area(S)).   diltiazem 240 MG 24 hr capsule Commonly known as:  CARDIZEM CD Take 1 capsule (240 mg total) by mouth daily.   esomeprazole 40 MG capsule Commonly known as:  NEXIUM Take 40 mg by mouth daily as needed (acid reflux).   hydrochlorothiazide 25 MG tablet Commonly known as:   HYDRODIURIL Take 1 tablet (25 mg total) by mouth daily.   levothyroxine 150 MCG tablet Commonly known as:  SYNTHROID, LEVOTHROID Take 1 tablet (150 mcg total) by mouth every morning.   metoprolol tartrate 100 MG tablet Commonly known as:  LOPRESSOR TAKE ONE-HALF TABLET BY MOUTH TWICE DAILY   nystatin cream Commonly known as:  MYCOSTATIN Apply 1 application topically 2 (two) times daily. To affected areas   silver sulfADIAZINE 1 % cream Commonly known as:  SILVADENE Apply 1 application topically daily.   sulfamethoxazole-trimethoprim 400-80 MG tablet Commonly known as:  BACTRIM,SEPTRA Take 1 tablet by mouth 2 (two) times daily.   Vitamin D (Ergocalciferol) 50000 units Caps capsule Commonly known as:  DRISDOL Take 1 capsule (50,000 Units total) by mouth every 7 (seven) days. Takes on Thursdays.   warfarin 3 MG tablet Commonly known as:  COUMADIN Take as directed by the anticoagulation clinic. If you are unsure how to take this medication, talk to your nurse or doctor. Original instructions:  Take 1 tablet daily except 1 1/2 tablets on Sundays, Tuesdays and Thursdays          Objective:    BP (!) 146/90   Pulse 92   Temp (!) 96.8 F (36 C) (Oral)   Ht 5\' 1"  (1.549 m)   Wt 291 lb (132 kg)   BMI 54.98 kg/m   No Known Allergies  Physical Exam  Constitutional: She is oriented to person, place, and time. She appears well-developed and well-nourished. She appears distressed.  HENT:  Head: Normocephalic and atraumatic.  Right Ear: Tympanic membrane, external ear and ear canal normal.  Left Ear: Tympanic membrane, external ear and ear canal normal.  Nose: Nose normal. No rhinorrhea.  Mouth/Throat: Oropharynx is clear and moist and mucous membranes are normal. No oropharyngeal exudate or posterior oropharyngeal erythema.  Eyes: Conjunctivae and EOM are normal. Pupils are equal, round, and reactive to light.  Neck: Normal range of motion. Neck supple.  Cardiovascular:  Normal rate, normal heart sounds and intact distal pulses. An irregularly irregular rhythm present.  No extrasystoles are present. Exam reveals no gallop.  No murmur heard. Pulmonary/Chest: Effort normal and breath sounds normal.  Abdominal: Soft. Bowel sounds are normal.  Neurological: She is alert and oriented to person, place, and time. She has normal reflexes.  Skin: Skin is warm and dry. No rash noted. She is not diaphoretic.  Psychiatric: She has a normal mood and affect. Her behavior is normal. Judgment and thought content normal.  Bilateral edema both lower legs at 2+ pitting  Results for orders placed or performed in visit on 02/10/17  POCT INR  Result Value Ref Range   INR 3.8       Assessment & Plan:   1. Dysuria - Urinalysis, Complete - Ambulatory referral  to Urology  2. Atrial fibrillation, unspecified type ([place]) - For home use only DME 4 wheeled rolling walker with seat ([id number]) - DME Wheelchair manual  3. Acute on chronic systolic congestive heart failure ([place]) - For home use only DME 4 wheeled rolling walker with seat ([id number]) - DME Wheelchair manual  4. Sleeps in sitting position due to orthopnea  5. Lymphedema - For home use only DME 4 wheeled rolling walker with seat ([id number]) - DME Wheelchair manual  6. OAB (overactive bladder) - Ambulatory referral to Urology    Current Outpatient Medications:  .  acetaminophen (TYLENOL) 500 MG tablet, Take 500 mg by mouth every 6 (six) hours as needed for mild pain or moderate pain., Disp: , Rfl:  .  ALPRAZolam (XANAX) 0.25 MG tablet, Take 0.25 mg by mouth at bedtime as needed for anxiety or sleep. , Disp: , Rfl:  .  atorvastatin (LIPITOR) 80 MG tablet, Take 1 tablet (80 mg total) by mouth daily., Disp: 90 tablet, Rfl: 3 .  clotrimazole-betamethasone (LOTRISONE) cream, Apply 1 application topically daily as needed (for affected area(S))., Disp: , Rfl:  .  diltiazem (CARDIZEM CD) 240 MG 24 hr capsule, Take 1  capsule (240 mg total) by mouth daily., Disp: 90 capsule, Rfl: 3 .  esomeprazole (NEXIUM) 40 MG capsule, Take 40 mg by mouth daily as needed (acid reflux). , Disp: , Rfl:  .  levothyroxine (SYNTHROID, LEVOTHROID) 150 MCG tablet, Take 1 tablet (150 mcg total) by mouth every morning., Disp: 90 tablet, Rfl: 3 .  metoprolol (LOPRESSOR) 100 MG tablet, TAKE ONE-HALF TABLET BY MOUTH TWICE DAILY, Disp: 30 tablet, Rfl: 6 .  nystatin cream (MYCOSTATIN), Apply 1 application topically 2 (two) times daily. To affected areas, Disp: 30 g, Rfl: 0 .  silver sulfADIAZINE (SILVADENE) 1 % cream, Apply 1 application topically daily., Disp: 50 g, Rfl: 0 .  Vitamin D, Ergocalciferol, (DRISDOL) 50000 units CAPS capsule, Take 1 capsule (50,000 Units total) by mouth every 7 (seven) days. Takes on Thursdays., Disp: 12 capsule, Rfl: 3 .  warfarin (COUMADIN) 3 MG tablet, Take 1 tablet daily except 1 1/2 tablets on Sundays, Tuesdays and Thursdays, Disp: 45 tablet, Rfl: 4 .  hydrochlorothiazide (HYDRODIURIL) 25 MG tablet, Take 1 tablet (25 mg total) by mouth daily., Disp: 30 tablet, Rfl: 0 .  sulfamethoxazole-trimethoprim (BACTRIM,SEPTRA) 400-80 MG tablet, Take 1 tablet by mouth 2 (two) times daily., Disp: 20 tablet, Rfl: 0 Continue all other maintenance medications as listed above.  Follow up plan: Return in about 3 months (around 05/24/2017) for recheck.  Educational handout given for [place] PA-C [facility] [address]  [place], [place] [address] [phone]   02/22/2017, 1:13 PM

## 2017-03-03 ENCOUNTER — Ambulatory Visit (INDEPENDENT_AMBULATORY_CARE_PROVIDER_SITE_OTHER): Payer: Medicare Other | Admitting: *Deleted

## 2017-03-03 DIAGNOSIS — Z5181 Encounter for therapeutic drug level monitoring: Secondary | ICD-10-CM | POA: Diagnosis not present

## 2017-03-03 DIAGNOSIS — I4891 Unspecified atrial fibrillation: Secondary | ICD-10-CM

## 2017-03-03 LAB — POCT INR: INR: 2.7

## 2017-03-11 ENCOUNTER — Ambulatory Visit (INDEPENDENT_AMBULATORY_CARE_PROVIDER_SITE_OTHER): Payer: Medicare Other

## 2017-03-11 ENCOUNTER — Ambulatory Visit (INDEPENDENT_AMBULATORY_CARE_PROVIDER_SITE_OTHER): Payer: Medicare Other | Admitting: Family Medicine

## 2017-03-11 VITALS — BP 140/90 | HR 57 | Temp 97.9°F | Ht 61.0 in | Wt 294.0 lb

## 2017-03-11 DIAGNOSIS — L304 Erythema intertrigo: Secondary | ICD-10-CM

## 2017-03-11 DIAGNOSIS — I890 Lymphedema, not elsewhere classified: Secondary | ICD-10-CM

## 2017-03-11 DIAGNOSIS — S6991XA Unspecified injury of right wrist, hand and finger(s), initial encounter: Secondary | ICD-10-CM | POA: Diagnosis not present

## 2017-03-11 DIAGNOSIS — W19XXXA Unspecified fall, initial encounter: Secondary | ICD-10-CM | POA: Diagnosis not present

## 2017-03-11 DIAGNOSIS — M79605 Pain in left leg: Secondary | ICD-10-CM

## 2017-03-11 DIAGNOSIS — M79644 Pain in right finger(s): Secondary | ICD-10-CM | POA: Diagnosis not present

## 2017-03-11 DIAGNOSIS — S62524A Nondisplaced fracture of distal phalanx of right thumb, initial encounter for closed fracture: Secondary | ICD-10-CM | POA: Diagnosis not present

## 2017-03-11 DIAGNOSIS — M79662 Pain in left lower leg: Secondary | ICD-10-CM | POA: Diagnosis not present

## 2017-03-11 MED ORDER — NYSTATIN 100000 UNIT/GM EX POWD: CUTANEOUS | 1 refills | Status: DC

## 2017-03-11 NOTE — Progress Notes (Signed)
Subjective: ER:Megan [name], LLE injury, R thumb injury, rash under breast PCP: Megan Sleeper, PA-C [id number] H [name] presenting to clinic today for:  Patient reports that she has sustained 2 falls in the last month.  She describes both episodes as mechanical falls.  The most recent episode, she notes that she went to step on a crate that she uses to get in and out of her car and fell through it.  She denies any penetration into the lower extremity, no bleeding or weeping from her left lower extremity.  She notes though that it seems to be more swollen particularly around the lower aspect of the leg and it is more painful.  She notes pain is constant.  She took Tylenol this morning which did help with the pain.  Denies fevers, chills, purulence from the lower extremity.  Additionally, she is currently on anticoagulation w/ Warfarin.  She also notes that she jammed her right thumb during the initial fall on December 2.  She describes that fall as tripping over an item on the floor at home.  She is actually recently seen her PCP for walker and wheelchair.  She notes that she was able to get the wheelchair but has not been able to get Medicare to pay for the walker.  She plans to pay for this out of pocket as she does feel like this would be more useful and safe for her to get around at home.  Denies hitting her head, loss of consciousness, chest pain or shortness of breath.  Patient also reports an irritated area of skin underneath her right breast.  She notes that she is dealt with this type of issue for some time.  She is wondering if it appears infected or not.  Again, she denies fevers, chills, nausea, vomiting.  She has not been applying anything to the area.  She does have a bottle of Bactrim at home for urinary tract infection and has been treated with Bactrim in the past for this issue.  She has not yet started this antibiotic.  She knows that she is supposed to keep area dry but  notes that she often does not.  She does not wear a brassiere or keep any clothing underneath the breast to keep the area dry.   ROS: Per HPI  No Known Allergies Past Medical History:  Diagnosis Date  . Acute diastolic heart failure ([place])   . Anxiety state, unspecified   . Congestive heart failure, unspecified   . Degenerative joint disease   . GERD (gastroesophageal reflux disease)   . Hyperlipidemia   . Hypertension   . Lymphedema   . Obesity   . Persistent atrial fibrillation (HCC)     post-termination pauses with afib s/p PPM  . Presence of permanent cardiac pacemaker   . Sinoatrial node dysfunction (HCC)    s/p PPM  . Sleep apnea    compliant with CPAP  . Unspecified hypothyroidism   . Unspecified venous (peripheral) insufficiency     Current Outpatient Medications:  .  acetaminophen (TYLENOL) 500 MG tablet, Take 500 mg by mouth every 6 (six) hours as needed for mild pain or moderate pain., Disp: , Rfl:  .  ALPRAZolam (XANAX) 0.25 MG tablet, Take 0.25 mg by mouth at bedtime as needed for anxiety or sleep. , Disp: , Rfl:  .  atorvastatin (LIPITOR) 80 MG tablet, Take 1 tablet (80 mg total) by mouth daily., Disp: 90 tablet, Rfl: 3 .  clotrimazole-betamethasone (LOTRISONE) cream, Apply 1 application topically daily as needed (for affected area(S))., Disp: , Rfl:  .  diltiazem (CARDIZEM CD) 240 MG 24 hr capsule, Take 1 capsule (240 mg total) by mouth daily., Disp: 90 capsule, Rfl: 3 .  esomeprazole (NEXIUM) 40 MG capsule, Take 40 mg by mouth daily as needed (acid reflux). , Disp: , Rfl:  .  hydrochlorothiazide (HYDRODIURIL) 25 MG tablet, Take 1 tablet (25 mg total) by mouth daily., Disp: 30 tablet, Rfl: 0 .  levothyroxine (SYNTHROID, LEVOTHROID) 150 MCG tablet, Take 1 tablet (150 mcg total) by mouth every morning., Disp: 90 tablet, Rfl: 3 .  metoprolol (LOPRESSOR) 100 MG tablet, TAKE ONE-HALF TABLET BY MOUTH TWICE DAILY, Disp: 30 tablet, Rfl: 6 .  nystatin cream (MYCOSTATIN),  Apply 1 application topically 2 (two) times daily. To affected areas, Disp: 30 g, Rfl: 0 .  silver sulfADIAZINE (SILVADENE) 1 % cream, Apply 1 application topically daily., Disp: 50 g, Rfl: 0 .  sulfamethoxazole-trimethoprim (BACTRIM,SEPTRA) 400-80 MG tablet, Take 1 tablet by mouth 2 (two) times daily., Disp: 20 tablet, Rfl: 0 .  Vitamin D, Ergocalciferol, (DRISDOL) 50000 units CAPS capsule, Take 1 capsule (50,000 Units total) by mouth every 7 (seven) days. Takes on Thursdays., Disp: 12 capsule, Rfl: 3 .  warfarin (COUMADIN) 3 MG tablet, Take 1 tablet daily except 1 1/2 tablets on Sundays, Tuesdays and Thursdays, Disp: 45 tablet, Rfl: 4 Social History   Socioeconomic History  . Marital status: Widowed    Spouse name: Not on file  . Number of children: Not on file  . Years of education: Not on file  . Highest education level: Not on file  Social Needs  . Financial resource strain: Not on file  . Food insecurity - worry: Not on file  . Food insecurity - inability: Not on file  . Transportation needs - medical: Not on file  . Transportation needs - non-medical: Not on file  Occupational History  . Occupation: RETIRED    Employer: RETIRED  Tobacco Use  . Smoking status: Former Smoker    Packs/day: 0.50    Years: 10.00    Pack years: 5.00    Types: Cigarettes    Last attempt to quit: 03/29/1978    Years since quitting: 38.9  . Smokeless tobacco: Never Used  Substance and Sexual Activity  . Alcohol use: No    Alcohol/week: 0.0 oz  . Drug use: No  . Sexual activity: No    Birth control/protection: None  Other Topics Concern  . Not on file  Social History Narrative  . Not on file   Family History  Problem Relation Age of Onset  . Parkinson's disease Unknown   . CVA Unknown   . Parkinson's disease Father     Objective: Office vital signs reviewed. BP 140/90   Pulse (!) 57   Temp 97.9 F (36.6 C) (Oral)   Ht 5\' 1"  (1.549 m)   Wt 294 lb (133.4 kg)   BMI 55.55 kg/m    Physical Examination:  General: Awake, alert, morbidly obese, No acute distress HEENT: [place]/AT, MMM, PERRL, EOMI Cardio: +2 DP; rate slightly bradycardic Pulm: normal work of breathing on room air Extremities: Bilateral lower extremities with lymphedema present.  Left lower extremity: With blanching erythema appreciated from the mid -shin down.  Area of erythema corresponds to area of increased edema and swelling.  There is no increased warmth over this area.  Some dry skin but no overt skin breakdown appreciated.  No  lacerations or wounds seen.  She does have moderate tenderness to palpation particularly over the anterior lateral aspect of the left lower extremity.  Right hand: Patient has moderate swelling at the base of the right thumb.  There is bruising appreciated above the PIP joint.  She is able to move the thumb independently but does have reduced range of motion particularly with adduction of the thumb.  Cap refill brisk.  No tenderness to palpation of the joint. MSK: Antalgic gait; requires cane for ambulation. Skin: dry; some cracking of the lower extremity associated with stasis dermatitis.  No exudate or bleeding.  Right breast: Patient has blanching erythematous, shiny skin which is macerated under the right breast and along the right chest.  There is no exudate or bleeding seen.  No pustules seen.  Mild tenderness to palpation. Neuro: Light touch sensation grossly intact, follows all commands.  Dg Tibia/fibula Left  Result Date: 03/11/2017 CLINICAL DATA:  Pain following recent falls EXAM: LEFT TIBIA AND FIBULA - 2 VIEW COMPARISON:  None. FINDINGS: Frontal and lateral views were obtained. There is no acute fracture or dislocation. There is osteoarthritic change in the left knee medially. No erosive change or abnormal periosteal reaction. There are foci of arteriovascular calcification in the ankle region. IMPRESSION: No acute fracture or dislocation. Osteoarthritic change in medial  knee joint. Atherosclerotic calcifications are noted in the ankle region. Electronically Signed   By: Lowella Grip III M.D.   On: 03/11/2017 09:52   Dg Finger Thumb Right  Result Date: 03/11/2017 CLINICAL DATA:  Fall.  Right thumb pain. EXAM: RIGHT THUMB 2+V COMPARISON:  No recent prior . FINDINGS: Diffuse osteopenia and degenerative change. Very subtle nondisplaced fracture of the distal aspect of the right first metacarpal cannot be excluded. Tiny bone chip within the adjacent soft tissues may be present. No evidence of dislocation. IMPRESSION: 1. Very subtle nondisplaced fracture of the distal aspect of the right first metacarpal cannot be excluded. Tiny adjacent bone chip within the soft tissues may be present. No evidence of dislocation. 2. Diffuse osteopenia and degenerative change. Electronically Signed   By: Marcello Moores  Register   On: 03/11/2017 09:52   Assessment/ Plan: 71 y.o. female   1. Fall, initial encounter Both falls seem mechanical.  However, she does seem to have significant physical deconditioning.  She is working on paying out of pocket for a walker for increased stability.  I did reinforce that I think this is a good idea.  Home care instructions to reduce falls were reviewed and a handout was provided.  She will follow-up with her PCP as needed. - DG Finger Thumb Right; Future - DG Tibia/Fibula Left; Future  2. Closed nondisplaced fracture of distal phalanx of right thumb, initial encounter Suspicious area at the distal aspect of the right thumb, concerning for fracture.  She was placed in a thumb spica today.  I referred her to orthopedist for further evaluation and management.  She may continue the Tylenol every 8 hours as needed for pain. - Ambulatory referral to Orthopedic Surgery  3. Thumb pain, right She may continue Tylenol every 8 hours as needed.  She will follow-up with her PCP in 2 weeks.  She was referred to orthopedics for further evaluation as well. - DG  Finger Thumb Right; Future  4. Left leg pain Likely soft tissue injury secondary to recent mechanical fall.  Imaging did not reveal any acute fractures. - DG Tibia/Fibula Left; Future  5. Intertriginous dermatitis associated with moisture Area  in the right breast consistent with intertrigo dermatitis.  I suspect this is secondary to morbid obesity, increased friction and moisture accumulation.  There is no evidence of purulent infection on exam today.  However, she is certainly at risk given lack of compliance with keeping areas dry and clean.  I have prescribed her nystatin powder to apply up to 4 times daily for the next 10 days.  I also recommended that she consider starting the Bactrim antibiotic prescribed by her PCP for UTIs to cover for any early bacterial infection.  She seems to have tolerated this well in the past while on warfarin.  I did advise that the Bactrim may interact with the warfarin and that if she experiences any abnormal/increased bleeding to please seek immediate medical attention.  She will follow up with PCP in 2 weeks for skin recheck  6. Lymphedema Needs compression. - AMB referral to wound care center   Orders Placed This Encounter  Procedures  . DG Finger Thumb Right    Standing Status:   Future    Number of Occurrences:   1    Standing Expiration Date:   05/11/2018    Order Specific Question:   Reason for Exam (SYMPTOM  OR DIAGNOSIS REQUIRED)    Answer:   fall/ injury    Order Specific Question:   Preferred imaging location?    Answer:   Internal  . DG Tibia/Fibula Left    Standing Status:   Future    Number of Occurrences:   1    Standing Expiration Date:   05/12/2018    Order Specific Question:   Reason for Exam (SYMPTOM  OR DIAGNOSIS REQUIRED)    Answer:   fall; LLE pain and swelling on anticoagulation    Order Specific Question:   Preferred imaging location?    Answer:   Internal    Order Specific Question:   Radiology Contrast Protocol - do NOT remove  file path    Answer:   [email or web address]   Meds ordered this encounter  Medications  . nystatin (MYCOSTATIN/NYSTOP) powder    Sig: Apply up to 4 times daily to affected area under right breast x 10 days    Dispense:  30 g    Refill:  [place], DO [place] [phone]

## 2017-03-11 NOTE — Patient Instructions (Addendum)
Your x-ray did show a small break at the tip of your thumb.  I have referred you to orthopedics for further evaluation and management of this.  There was no evidence of fracture/break in your leg.  Continue Tylenol every 8 hours as needed for pain.  Schedule an appointment to see Glenard Haring in 2 weeks to check in on your skin in your leg.  Intertrigo Intertrigo is skin irritation or inflammation (dermatitis) that occurs when folds of skin rub together. The irritation can cause a rash and make skin raw and itchy. This condition most commonly occurs in the skin folds of these areas:  Toes.  Armpits.  Groin.  Belly.  Breasts.  Buttocks.  Intertrigo is not passed from person to person (is not contagious). What are the causes? This condition is caused by heat, moisture, friction, and lack of air circulation. The condition can be made worse by:  Sweat.  Bacteria or a fungus, such as yeast.  What increases the risk? This condition is more likely to occur if you have moisture in your skin folds. It is also more likely to develop in people who:  Have diabetes.  Are overweight.  Are on bed rest.  Live in a warm and moist climate.  Wear splints, braces, or other medical devices.  Are not able to control their bowels or bladder (have incontinence).  What are the signs or symptoms? Symptoms of this condition include:  A pink or red skin rash.  Brown patches on the skin.  Raw or scaly skin.  Itchiness.  A burning feeling.  Bleeding.  Leaking fluid.  A bad smell.  How is this diagnosed? This condition is diagnosed with a medical history and physical exam. You may also have a skin swab to test for bacteria or a fungus, such as yeast. How is this treated? Treatment may include:  Cleaning and drying your skin.  An oral antibiotic medicine or antibiotic skin cream for a bacterial infection.  Antifungal cream or pills for an infection that was caused by a fungus, such as  yeast.  Steroid ointment to relieve itchiness and irritation.  Follow these instructions at home:  Keep the affected area clean and dry.  Do not scratch your skin.  Stay in a cool environment as much as possible. Use an air conditioner or fan, if available.  Apply over-the-counter and prescription medicines only as told by your health care provider.  If you were prescribed an antibiotic medicine, use it as told by your health care provider. Do not stop using the antibiotic even if your condition improves.  Keep all follow-up visits as told by your health care provider. This is important. How is this prevented?  Maintain a healthy weight.  Take care of your feet, especially if you have diabetes. Foot care includes: ? Wearing shoes that fit well. ? Keeping your feet dry. ? Wearing clean, breathable socks.  Protect the skin around your groin and buttocks, especially if you have incontinence. Skin protection includes: ? Following a regular cleaning routine. ? Using moisturizers and skin protectants. ? Changing protection pads frequently.  Do not wear tight clothes. Wear clothes that are loose and absorbent. Wear clothes that are made of cotton.  Wear a bra that gives good support, if needed.  Shower and dry yourself thoroughly after activity. Use a hair dryer on a cool setting to dry between skin folds, especially after you bathe.  If you have diabetes, keep your blood sugar under control. Contact a  health care provider if:  Your symptoms do not improve with treatment.  Your symptoms get worse or they spread.  You notice increased redness and warmth.  You have a fever. This information is not intended to replace advice given to you by your health care provider. Make sure you discuss any questions you have with your health care provider. Document Released: 03/15/2005 Document Revised: 08/21/2015 Document Reviewed: 09/16/2014 Elsevier Interactive Patient Education  2018  [place] in the Home Falls can cause injuries. They can happen to people of all ages. There are many things you can do to make your home safe and to help prevent falls. What can I do on the outside of my home?  Regularly fix the edges of walkways and driveways and fix any cracks.  Remove anything that might make you trip as you walk through a door, such as a raised step or threshold.  Trim any bushes or trees on the path to your home.  Use bright outdoor lighting.  Clear any walking paths of anything that might make someone trip, such as rocks or tools.  Regularly check to see if handrails are loose or broken. Make sure that both sides of any steps have handrails.  Any raised decks and porches should have guardrails on the edges.  Have any leaves, snow, or ice cleared regularly.  Use sand or salt on walking paths during winter.  Clean up any spills in your garage right away. This includes oil or grease spills. What can I do in the bathroom?  Use night lights.  Install grab bars by the toilet and in the tub and shower. Do not use towel bars as grab bars.  Use non-skid mats or decals in the tub or shower.  If you need to sit down in the shower, use a plastic, non-slip stool.  Keep the floor dry. Clean up any water that spills on the floor as soon as it happens.  Remove soap buildup in the tub or shower regularly.  Attach bath mats securely with double-sided non-slip rug tape.  Do not have throw rugs and other things on the floor that can make you trip. What can I do in the bedroom?  Use night lights.  Make sure that you have a light by your bed that is easy to reach.  Do not use any sheets or blankets that are too big for your bed. They should not hang down onto the floor.  Have a firm chair that has side arms. You can use this for support while you get dressed.  Do not have throw rugs and other things on the floor that can make you trip. What  can I do in the kitchen?  Clean up any spills right away.  Avoid walking on wet floors.  Keep items that you use a lot in easy-to-reach places.  If you need to reach something above you, use a strong step stool that has a grab bar.  Keep electrical cords out of the way.  Do not use floor polish or wax that makes floors slippery. If you must use wax, use non-skid floor wax.  Do not have throw rugs and other things on the floor that can make you trip. What can I do with my stairs?  Do not leave any items on the stairs.  Make sure that there are handrails on both sides of the stairs and use them. Fix handrails that are broken or loose. Make sure that handrails  are as long as the stairways.  Check any carpeting to make sure that it is firmly attached to the stairs. Fix any carpet that is loose or worn.  Avoid having throw rugs at the top or bottom of the stairs. If you do have throw rugs, attach them to the floor with carpet tape.  Make sure that you have a light switch at the top of the stairs and the bottom of the stairs. If you do not have them, ask someone to add them for you. What else can I do to help prevent falls?  Wear shoes that: ? Do not have high heels. ? Have rubber bottoms. ? Are comfortable and fit you well. ? Are closed at the toe. Do not wear sandals.  If you use a stepladder: ? Make sure that it is fully opened. Do not climb a closed stepladder. ? Make sure that both sides of the stepladder are locked into place. ? Ask someone to hold it for you, if possible.  Clearly mark and make sure that you can see: ? Any grab bars or handrails. ? First and last steps. ? Where the edge of each step is.  Use tools that help you move around (mobility aids) if they are needed. These include: ? Canes. ? Walkers. ? Scooters. ? Crutches.  Turn on the lights when you go into a dark area. Replace any light bulbs as soon as they burn out.  Set up your furniture so you  have a clear path. Avoid moving your furniture around.  If any of your floors are uneven, fix them.  If there are any pets around you, be aware of where they are.  Review your medicines with your doctor. Some medicines can make you feel dizzy. This can increase your chance of falling. Ask your doctor what other things that you can do to help prevent falls. This information is not intended to replace advice given to you by your health care provider. Make sure you discuss any questions you have with your health care provider. Document Released: 01/09/2009 Document Revised: 08/21/2015 Document Reviewed: 04/19/2014 Elsevier Interactive Patient Education  [employer].

## 2017-03-14 ENCOUNTER — Ambulatory Visit (HOSPITAL_COMMUNITY): Payer: Medicare Other | Attending: Family Medicine | Admitting: Physical Therapy

## 2017-03-14 ENCOUNTER — Encounter (HOSPITAL_COMMUNITY): Payer: Self-pay | Admitting: Physical Therapy

## 2017-03-14 DIAGNOSIS — R262 Difficulty in walking, not elsewhere classified: Secondary | ICD-10-CM | POA: Insufficient documentation

## 2017-03-14 DIAGNOSIS — M79605 Pain in left leg: Secondary | ICD-10-CM | POA: Insufficient documentation

## 2017-03-14 DIAGNOSIS — I890 Lymphedema, not elsewhere classified: Secondary | ICD-10-CM | POA: Diagnosis not present

## 2017-03-14 NOTE — Therapy (Signed)
[place], [place], [address] Phone: [phone]   Fax:  [phone]  Physical Therapy Evaluation  Patient Details  Name: OCTAVIA VELADOR MRN: [id number] Date of Birth: [birth year] Referring Provider: Madie Reno   Encounter Date: 03/14/2017  PT End of Session - 03/14/17 1346    Visit Number  1    Number of Visits  18    Date for PT Re-Evaluation  04/13/17    Authorization Type  Medicare    Authorization - Visit Number  1    Authorization - Number of Visits  10    PT Start Time  0815    PT Stop Time  0945    PT Time Calculation (min)  90 min    Activity Tolerance  Patient tolerated treatment well    Behavior During Therapy  [facility] for tasks assessed/performed       Past Medical History:  Diagnosis Date  . Acute diastolic heart failure ([place])   . Anxiety state, unspecified   . Congestive heart failure, unspecified   . Degenerative joint disease   . GERD (gastroesophageal reflux disease)   . Hyperlipidemia   . Hypertension   . Lymphedema   . Obesity   . Persistent atrial fibrillation (HCC)     post-termination pauses with afib s/p PPM  . Presence of permanent cardiac pacemaker   . Sinoatrial node dysfunction (HCC)    s/p PPM  . Sleep apnea    compliant with CPAP  . Unspecified hypothyroidism   . Unspecified venous (peripheral) insufficiency     Past Surgical History:  Procedure Laterality Date  . CATARACT EXTRACTION W/PHACO Left 09/09/2014   Procedure: CATARACT EXTRACTION PHACO AND INTRAOCULAR LENS PLACEMENT (IOC);  Surgeon: Tonny Branch, MD;  Location: [facility];  Service: Ophthalmology;  Laterality: Left;  CDE: 6.99  . CATARACT EXTRACTION W/PHACO Right 10/07/2014   Procedure: CATARACT EXTRACTION PHACO AND INTRAOCULAR LENS PLACEMENT RIGHT EYE;  Surgeon: Tonny Branch, MD;  Location: [facility];  Service: Ophthalmology;  Laterality: Right;  CDE:5.16  . PACEMAKER INSERTION     SJM by JA    There were no  vitals filed for this visit.   Subjective Assessment - 03/14/17 0834    Subjective  Ms. Saddler has been diagnosed with lymphedema for years.  She has had intermittent treatment and hospitalization for her lymphedema.  The last treatment was completed 09/04/2015 thru 12/11/2015.  Ms. Lieurance states that this time  she fell  Bruising her legs.  She  was not able to tolerate the pressure of the  juxtafit on her left leg and that is when she had a flare up of her lymphedema.   She is on antibiotics.       Pertinent History  lymphedema, HTN, AFib CHF  obesity,     Limitations  Standing;Walking    How long can you sit comfortably?  no  problem    How long can you stand comfortably?  no longer than a minute without support.    How long can you walk comfortably?  a couple of minutes with assistive device     Currently in Pain?  Yes    Pain Score  10-Worst pain ever    Pain Location  Leg    Pain Orientation  Left    Pain Descriptors / Indicators  Tightness    Pain Type  Acute pain    Pain Frequency  Constant    Aggravating Factors  dependent position     Pain Relieving Factors  elevation          [facility] PT Assessment - 03/14/17 0001      Assessment   Medical Diagnosis  B lymphedema    Referring Provider  Ashly Gottscholk    Onset Date/Surgical Date  02/27/17    Next MD Visit  not scheduled      Precautions   Precautions  Fall      Restrictions   Weight Bearing Restrictions  No      Balance Screen   Has the patient fallen in the past 6 months  Yes    How many times?  2    Has the patient had a decrease in activity level because of a fear of falling?   Yes    Is the patient reluctant to leave their home because of a fear of falling?   Yes      [place] residence    Type of [place] to enter    Entrance Stairs-Number of Steps  4    Entrance Stairs-Rails  Right      Prior Function   Level of Independence  Independent  with community mobility with device    Vocation  Retired    Leisure  none      Cognition   Overall Cognitive Status  Within Functional Limits for tasks assessed      Observation/Other Assessments   Focus on Therapeutic Outcomes (FOTO)   69/90; 76% affected        LYMPHEDEMA/ONCOLOGY QUESTIONNAIRE - 03/14/17 0842      Date Lymphedema/Swelling Started   Date  02/27/17  (Pended)       What other symptoms do you have   Are you Having Heaviness or Tightness  Yes  (Pended)     Are you having Pain  Yes  (Pended)     Are you having pitting edema  Yes  (Pended)     Body Site  Left leg   (Pended)     Is it Hard or Difficult finding clothes that fit  Yes  (Pended)     Do you have infections  Yes  (Pended)  Pt is on antibiotic     [place] Sign  Yes  (Pended)       Lymphedema Stage   Stage  STAGE 3 ELEPHANTIASIS  (Pended)       Lymphedema Assessments   Lymphedema Assessments  Lower extremities  (Pended)       Right Lower Extremity Lymphedema   10 cm Proximal to Suprapatella  73 cm  (Pended)     At Midpatella/Popliteal Crease  53 cm  (Pended)     30 cm Proximal to Floor at Lateral Plantar Foot  52 cm  (Pended)     20 cm Proximal to Floor at Lateral Plantar Foot  44 1  (Pended)     10 cm Proximal to Floor at Lateral Malleoli  29.4 cm  (Pended)     Circumference of ankle/heel  33 cm.  (Pended)     5 cm Proximal to 1st MTP Joint  23.7 cm  (Pended)     Across MTP Joint  23.3 cm  (Pended)     Around Proximal Great Toe  8.5 cm  (Pended)       Left Lower Extremity Lymphedema   10 cm Proximal to Suprapatella  73 cm  (  Pended)     At Midpatella/Popliteal Crease  58 cm  (Pended)     30 cm Proximal to Floor at Lateral Plantar Foot  61 cm  (Pended)     20 cm Proximal to Floor at Lateral Plantar Foot  53.6 cm  (Pended)     10 cm Proximal to Floor at Lateral Malleoli  41.6 cm  (Pended)     Circumference of ankle/heel  36.3 cm.  (Pended)     5 cm Proximal to 1st MTP Joint  25.2 cm  (Pended)      Across MTP Joint  24.5 cm  (Pended)     Around Proximal Great Toe  9 cm  (Pended)           Objective measurements completed on examination: See above findings.      [facility] Adult PT Treatment/Exercise - 03/14/17 0001      Manual Therapy   Manual Therapy  Manual Lymphatic Drainage (MLD);Compression Bandaging    Manual therapy comments  completed seperately from all other aspects of treatment    Manual Lymphatic Drainage (MLD)  to include supraclavicular; deep and superfical abdominal followed by routing fluid using Lt inguinal axillary anastomosis and upper leg     Compression Bandaging  using extra cotton and profore multilayer compression bandage system.              PT Education - 03/14/17 1345    Education provided  Yes    Education Details  to complete previous exercises given to pt to promote lymphatic circulation, to keep bandages dry.     Person(s) Educated  Patient    Methods  Explanation    Comprehension  Verbalized understanding       PT Short Term Goals - 03/14/17 1347      PT SHORT TERM GOAL #1   Title  Pt Lt LE volumes to be decreased by 3 cm to reduce risk of wounds    Time  4    Period  Weeks    Status  New    Target Date  04/11/17      PT SHORT TERM GOAL #2   Title  PT Lt LE to no longer have heat or redness for evidence of decreased cellulitis.     Time  4    Period  Weeks    Status  New      PT SHORT TERM GOAL #3   Title  PT to state that she has called [employer] to acquire new juxtafit garment.     Time  4    Period  Weeks      PT SHORT TERM GOAL #4   Title  PT to be ambulating with a rolling walker in order to reduce risk of falling     Time  2    Period  Weeks    Status  New    Target Date  03/28/17        PT Long Term Goals - 03/14/17 1349      PT LONG TERM GOAL #1   Title  PT LT LE volume to be decreased by 6 cm to decrease pain to no greater than a 2/10 to allow pt to stand for 5-10 minutes without having to sit down to  make a small meal.     Time  8    Period  Weeks    Status  New    Target Date  05/09/17  PT LONG TERM GOAL #2   Title  Pt to be walking with a rolling walker for a 30 minutes at a time to improve lymph circulation.     Time  8    Period  Weeks    Status  New      PT LONG TERM GOAL #3   Title  Pt to have acquired  new juxtafit to ensure proper compression to prevent future exacerbation of lymphedema.     Time  8    Period  Weeks             Plan - 03/23/17 0813    Clinical Impression Statement  Ms. Schepp is a 71 yo female who has been diagnosed with lymphedema for years.  She was wearing juxtafit on her legs but she fell in December bruising her left leg therefore she could not tolerate the wraps on her left leg.  She elevated her leg as much as she could and has put ice on it but her leg just keept getting bigger.     She was given short stretch bandages but states that she does not know where they are at this time.  At this time she is having an acute exacerbation with her Lt leg being red, weeping and painful.  Ms. Stroschein has been referred to skilled physical therapy and will benefit from skilled physical therapy  for lymphedema treatment in order to reduce the risk of having to be hosipitalized. Marland Kitchen    History and Personal Factors relevant to plan of care:  HTN, A fib, CHF, obesity lymphedema, pacemaker    Clinical Presentation  Evolving    Clinical Decision Making  Moderate    Rehab Potential  Good    PT Frequency  3x / week    PT Duration  6 weeks    PT Treatment/Interventions  ADLs/Self Care Home Management;Patient/family education;Manual lymph drainage;Compression bandaging    PT Next Visit Plan  Continue wiith manual lymph drainage and compression bandaging.  Begin short stretch if pt finds her bandages     PT Home Exercise Plan  lymphedema exercises        Patient will benefit from skilled therapeutic intervention in order to improve the following deficits and  impairments:  Pain, Decreased strength, Obesity, Difficulty walking, Decreased activity tolerance, Increased edema  Visit Diagnosis: Lymphedema, not elsewhere classified  Pain in left leg  Difficulty in walking, not elsewhere classified  G-Codes - 2017/03/23 1358    Functional Assessment Tool Used (Outpatient Only)  life impact score     Functional Limitation  Other PT primary    Other PT Primary Current Status (Q7619)  At least 60 percent but less than 80 percent impaired, limited or restricted    Other PT Primary Goal Status (J0932)  At least 40 percent but less than 60 percent impaired, limited or restricted        Problem List Patient Active Problem List   Diagnosis Date Noted  . OAB (overactive bladder) 02/22/2017  . Lymphedema 01/26/2017  . Gastroesophageal reflux disease without esophagitis 01/26/2017  . Acquired hypothyroidism 01/26/2017  . Mixed hyperlipidemia 01/26/2017  . Pyrexia   . Sleeps in sitting position due to orthopnea 07/31/2015  . Acute on chronic systolic congestive heart failure ([place]) 06/09/2015  . Morbid obesity due to excess calories ([place]) 06/09/2015  . PVD (peripheral vascular disease) with claudication ([place]) 06/09/2015  . OSA (obstructive sleep apnea) 06/09/2015  . Long term (current) use of anticoagulants 06/19/2010  .  Tachycardia-bradycardia syndrome ([place]) 11/18/2008  . PPM-St.Jude 11/18/2008  . Hyperlipidemia associated with type 2 diabetes mellitus ([place]) 02/21/2008  . HYPERTENSION, BENIGN 02/21/2008  . ATRIAL FIBRILLATION 02/21/2008    Rayetta Humphrey, PT CLT [phone] 03/14/2017, 1:59 PM  [facility] [address] [place], [place], [address] Phone: [phone]   Fax:  [phone]  Name: ALEXY BRINGLE MRN: [id number] Date of Birth: 07-01-1945

## 2017-03-16 ENCOUNTER — Ambulatory Visit (HOSPITAL_COMMUNITY): Payer: Medicare Other | Admitting: Physical Therapy

## 2017-03-16 ENCOUNTER — Encounter (HOSPITAL_COMMUNITY): Payer: Self-pay | Admitting: Physical Therapy

## 2017-03-16 ENCOUNTER — Other Ambulatory Visit: Payer: Self-pay | Admitting: Family Medicine

## 2017-03-16 ENCOUNTER — Telehealth: Payer: Self-pay | Admitting: Physician Assistant

## 2017-03-16 DIAGNOSIS — I890 Lymphedema, not elsewhere classified: Secondary | ICD-10-CM

## 2017-03-16 DIAGNOSIS — R262 Difficulty in walking, not elsewhere classified: Secondary | ICD-10-CM | POA: Diagnosis not present

## 2017-03-16 DIAGNOSIS — M79605 Pain in left leg: Secondary | ICD-10-CM

## 2017-03-16 MED ORDER — CEPHALEXIN 500 MG PO CAPS: 500.0000 mg | ORAL_CAPSULE | Freq: Four times a day (QID) | ORAL | 0 refills | Status: AC

## 2017-03-16 NOTE — Therapy (Signed)
[place], [place], [address] Phone: [phone]   Fax:  [phone]  Physical Therapy Treatment  Patient Details  Name: Megan [name] MRN: [id number] Date of Birth: 10-Jan-1946 Referring Provider: Madie Reno   Encounter Date: 03/16/2017  PT End of Session - 03/16/17 1639    Visit Number  2    Number of Visits  18    Date for PT Re-Evaluation  04/13/17    Authorization Type  Medicare    Authorization - Visit Number  2    Authorization - Number of Visits  10    PT Start Time  [phone]    PT Stop Time  1035    PT Time Calculation (min)  47 min    Activity Tolerance  Patient tolerated treatment well    Behavior During Therapy  [facility] for tasks assessed/performed       Past Medical History:  Diagnosis Date  . Acute diastolic heart failure ([place])   . Anxiety state, unspecified   . Congestive heart failure, unspecified   . Degenerative joint disease   . GERD (gastroesophageal reflux disease)   . Hyperlipidemia   . Hypertension   . Lymphedema   . Obesity   . Persistent atrial fibrillation (HCC)     post-termination pauses with afib s/p PPM  . Presence of permanent cardiac pacemaker   . Sinoatrial node dysfunction (HCC)    s/p PPM  . Sleep apnea    compliant with CPAP  . Unspecified hypothyroidism   . Unspecified venous (peripheral) insufficiency     Past Surgical History:  Procedure Laterality Date  . CATARACT EXTRACTION W/PHACO Left 09/09/2014   Procedure: CATARACT EXTRACTION PHACO AND INTRAOCULAR LENS PLACEMENT (IOC);  Surgeon: Tonny Branch, MD;  Location: [facility];  Service: Ophthalmology;  Laterality: Left;  CDE: 6.99  . CATARACT EXTRACTION W/PHACO Right 10/07/2014   Procedure: CATARACT EXTRACTION PHACO AND INTRAOCULAR LENS PLACEMENT RIGHT EYE;  Surgeon: Tonny Branch, MD;  Location: [facility];  Service: Ophthalmology;  Laterality: Right;  CDE:5.16  . PACEMAKER INSERTION     SJM by JA    There were no  vitals filed for this visit.  Subjective Assessment - 03/16/17 1637    Subjective  Pt states that her pain has not gone down at all.  Pt states that she did not call [facility] yet and she can not find her short stretch bandages.     Pertinent History  lymphedema, HTN, AFib CHF  obesity,     Limitations  Standing;Walking    How long can you sit comfortably?  no  problem    How long can you stand comfortably?  no longer than a minute without support.    How long can you walk comfortably?  a couple of minutes with assistive device     Currently in Pain?  Yes    Pain Score  10-Worst pain ever    Pain Location  Leg    Pain Orientation  Left    Pain Descriptors / Indicators  Aching;Stabbing;Throbbing;Tender    Pain Type  Acute pain    Pain Onset  1 to 4 weeks ago    Pain Frequency  Constant    Aggravating Factors   pressure    Pain Relieving Factors  unkown at this time                       [facility] Adult PT Treatment/Exercise - 03/16/17  0001      Manual Therapy   Manual Therapy  Manual Lymphatic Drainage (MLD);Compression Bandaging    Manual therapy comments  completed seperately from all other aspects of treatment    Manual Lymphatic Drainage (MLD)  to include supraclavicular; deep and superfical abdominal followed by routing fluid using Lt inguinal axillary anastomosis and upper leg     Compression Bandaging  Added foam and continued with  extra cotton and profore multilayer compression bandage system.                PT Short Term Goals - 03/16/17 1645      PT SHORT TERM GOAL #1   Title  Pt Lt LE volumes to be decreased by 3 cm to reduce risk of wounds    Time  4    Period  Weeks    Status  On-going      PT SHORT TERM GOAL #2   Title  PT Lt LE to no longer have heat or redness for evidence of decreased cellulitis.     Time  4    Period  Weeks    Status  On-going      PT SHORT TERM GOAL #3   Title  PT to state that she has called [employer] to  acquire new juxtafit garment.     Time  4    Period  Weeks    Status  On-going      PT SHORT TERM GOAL #4   Title  PT to be ambulating with a rolling walker in order to reduce risk of falling     Time  2    Period  Weeks    Status  On-going        PT Long Term Goals - 03/16/17 1645      PT LONG TERM GOAL #1   Title  PT LT LE volume to be decreased by 6 cm to decrease pain to no greater than a 2/10 to allow pt to stand for 5-10 minutes without having to sit down to make a small meal.     Time  8    Period  Weeks    Status  On-going      PT LONG TERM GOAL #2   Title  Pt to be walking with a rolling walker for a 30 minutes at a time to improve lymph circulation.     Time  8    Period  Weeks    Status  On-going      PT LONG TERM GOAL #3   Title  Pt to have acquired  new juxtafit to ensure proper compression to prevent future exacerbation of lymphedema.     Time  8    Period  Weeks    Status  On-going            Plan - 03/16/17 1640    Clinical Impression Statement  Upon removing bandages LE swelling has decreased slightly with good redcution of fluid in pt toes.  Pt continues to have bruising from her fall but bruising at ankle appears to have increased which may be due to compresion dressing.  Pt added foam to compression dressing in an effort to even pressure given to LE.  Therapist stressed again to pt that if she has increased pain she is to take the compression bandaging off.  Therapist gave pt the short stretch bandages that she needs to purchase so that we can bandage pt properly.  Rehab Potential  Good    PT Frequency  3x / week    PT Duration  6 weeks    PT Treatment/Interventions  ADLs/Self Care Home Management;Patient/family education;Manual lymph drainage;Compression bandaging    PT Next Visit Plan  Assess how adding foam does for patient comfort.l  Continue wiith manual lymph drainage and compression bandaging.  Begin short stretch when able.     PT Home  Exercise Plan  lymphedema exercises     Consulted and Agree with Plan of Care  Patient       Patient will benefit from skilled therapeutic intervention in order to improve the following deficits and impairments:  Pain, Decreased strength, Obesity, Difficulty walking, Decreased activity tolerance, Increased edema  Visit Diagnosis: Lymphedema, not elsewhere classified  Pain in left leg  Difficulty in walking, not elsewhere classified     Problem List Patient Active Problem List   Diagnosis Date Noted  . OAB (overactive bladder) 02/22/2017  . Lymphedema 01/26/2017  . Gastroesophageal reflux disease without esophagitis 01/26/2017  . Acquired hypothyroidism 01/26/2017  . Mixed hyperlipidemia 01/26/2017  . Pyrexia   . Sleeps in sitting position due to orthopnea 07/31/2015  . Acute on chronic systolic congestive heart failure ([place]) 06/09/2015  . Morbid obesity due to excess calories ([place]) 06/09/2015  . PVD (peripheral vascular disease) with claudication ([place]) 06/09/2015  . OSA (obstructive sleep apnea) 06/09/2015  . Long term (current) use of anticoagulants 06/19/2010  . Tachycardia-bradycardia syndrome ([place]) 11/18/2008  . PPM-St.Jude 11/18/2008  . Hyperlipidemia associated with type 2 diabetes mellitus ([place]) 02/21/2008  . HYPERTENSION, BENIGN 02/21/2008  . ATRIAL FIBRILLATION 02/21/2008    Rayetta Humphrey, PT CLT [phone] 03/16/2017, 4:46 PM  [facility] [address] [place], [place], [address] Phone: [phone]   Fax:  [phone]  Name: Megan [name] MRN: [id number] Date of Birth: March 29, 1946

## 2017-03-16 NOTE — Telephone Encounter (Signed)
Patient aware and verbalizes understanding. 

## 2017-03-16 NOTE — Telephone Encounter (Signed)
PT is rtn call please call her on cell phone

## 2017-03-16 NOTE — Telephone Encounter (Signed)
Patient was actually being treated for an intertrigo and told to take Bactrim to cover for possible developing cellulitis.  Have her discontinue the bactrim.  Renal function, platelets reviewed.  Keflex 500mg  QID x5d prescribed.  Please keep 04/01/16 appt w/ PCP for recheck.

## 2017-03-17 NOTE — Telephone Encounter (Signed)
Returned pt call.  Pt states that she has ordered the short stretch bandages.  She was unable to read the number of 10 cm but she thought it was a 3, which was correct.  Pt also stated that her leg hurt all night long.  Therapist explained that she had been instructed to take the compression bandaging off if she was having pain.  PT stated that she did not want to take the bandage off.  Therapist explained that pain can be a sign of injury and she needs to take the bandages off.  PT verbalized understanding.   Rayetta Humphrey, [place] CLT [phone]

## 2017-03-18 ENCOUNTER — Encounter (HOSPITAL_COMMUNITY): Payer: Self-pay | Admitting: Physical Therapy

## 2017-03-18 ENCOUNTER — Ambulatory Visit (HOSPITAL_COMMUNITY): Payer: Medicare Other | Admitting: Physical Therapy

## 2017-03-18 DIAGNOSIS — R262 Difficulty in walking, not elsewhere classified: Secondary | ICD-10-CM

## 2017-03-18 DIAGNOSIS — M79605 Pain in left leg: Secondary | ICD-10-CM

## 2017-03-18 DIAGNOSIS — I890 Lymphedema, not elsewhere classified: Secondary | ICD-10-CM

## 2017-03-18 NOTE — Therapy (Signed)
[place], [place], [address] Phone: [phone]   Fax:  [phone]  Physical Therapy Treatment  Patient Details  Name: Megan [name] MRN: [id number] Date of Birth: April 01, 1945 Referring Provider: Madie Reno   Encounter Date: 03/18/2017  PT End of Session - 03/18/17 1446    Visit Number  3    Number of Visits  18    Date for PT Re-Evaluation  04/13/17    Authorization Type  Medicare    Authorization - Visit Number  3    Authorization - Number of Visits  10    PT Start Time  1033    PT Stop Time  1115    PT Time Calculation (min)  42 min    Activity Tolerance  Patient tolerated treatment well    Behavior During Therapy  [facility] for tasks assessed/performed       Past Medical History:  Diagnosis Date  . Acute diastolic heart failure ([place])   . Anxiety state, unspecified   . Congestive heart failure, unspecified   . Degenerative joint disease   . GERD (gastroesophageal reflux disease)   . Hyperlipidemia   . Hypertension   . Lymphedema   . Obesity   . Persistent atrial fibrillation (HCC)     post-termination pauses with afib s/p PPM  . Presence of permanent cardiac pacemaker   . Sinoatrial node dysfunction (HCC)    s/p PPM  . Sleep apnea    compliant with CPAP  . Unspecified hypothyroidism   . Unspecified venous (peripheral) insufficiency     Past Surgical History:  Procedure Laterality Date  . CATARACT EXTRACTION W/PHACO Left 09/09/2014   Procedure: CATARACT EXTRACTION PHACO AND INTRAOCULAR LENS PLACEMENT (IOC);  Surgeon: Tonny Branch, MD;  Location: [facility];  Service: Ophthalmology;  Laterality: Left;  CDE: 6.99  . CATARACT EXTRACTION W/PHACO Right 10/07/2014   Procedure: CATARACT EXTRACTION PHACO AND INTRAOCULAR LENS PLACEMENT RIGHT EYE;  Surgeon: Tonny Branch, MD;  Location: [facility];  Service: Ophthalmology;  Laterality: Right;  CDE:5.16  . PACEMAKER INSERTION     SJM by JA    There were no  vitals filed for this visit.  Subjective Assessment - 03/18/17 1030    Subjective  Pt states that when she took the bandage off there was a bad bruise at the bottom of her leg; pt is taking warfin.    Pertinent History  lymphedema, HTN, AFib CHF  obesity,     Limitations  Standing;Walking    How long can you sit comfortably?  no  problem    How long can you stand comfortably?  no longer than a minute without support.    How long can you walk comfortably?  a couple of minutes with assistive device     Currently in Pain?  Yes    Pain Score  8     Pain Location  Leg    Pain Orientation  Left    Pain Descriptors / Indicators  Aching;Throbbing    Pain Onset  1 to 4 weeks ago            LYMPHEDEMA/ONCOLOGY QUESTIONNAIRE - 03/18/17 1031      Left Lower Extremity Lymphedema   10 cm Proximal to Suprapatella  79 cm    At Midpatella/Popliteal Crease  57.7 cm    30 cm Proximal to Floor at Lateral Plantar Foot  60.3 cm    20 cm Proximal to Floor at Lateral Plantar Foot  51.8 cm    10 cm Proximal to Floor at Lateral Malleoli  42 cm    Circumference of ankle/heel  36.7 cm.    5 cm Proximal to 1st MTP Joint  28.1 cm    Across MTP Joint  26.2 cm               [facility] Adult PT Treatment/Exercise - 03/18/17 0001      Manual Therapy   Manual Therapy  Manual Lymphatic Drainage (MLD);Compression Bandaging    Manual therapy comments  completed seperately from all other aspects of treatment    Manual Lymphatic Drainage (MLD)  to include supraclavicular; deep and superfical abdominal followed by routing fluid using Lt inguinal axillary anastomosis and upper leg     Compression Bandaging  foam , extra cotton and profore multilayer compression bandage system.                PT Short Term Goals - 03/16/17 1645      PT SHORT TERM GOAL #1   Title  Pt Lt LE volumes to be decreased by 3 cm to reduce risk of wounds    Time  4    Period  Weeks    Status  On-going      PT SHORT TERM  GOAL #2   Title  PT Lt LE to no longer have heat or redness for evidence of decreased cellulitis.     Time  4    Period  Weeks    Status  On-going      PT SHORT TERM GOAL #3   Title  PT to state that she has called [employer] to acquire new juxtafit garment.     Time  4    Period  Weeks    Status  On-going      PT SHORT TERM GOAL #4   Title  PT to be ambulating with a rolling walker in order to reduce risk of falling     Time  2    Period  Weeks    Status  On-going        PT Long Term Goals - 03/16/17 1645      PT LONG TERM GOAL #1   Title  PT LT LE volume to be decreased by 6 cm to decrease pain to no greater than a 2/10 to allow pt to stand for 5-10 minutes without having to sit down to make a small meal.     Time  8    Period  Weeks    Status  On-going      PT LONG TERM GOAL #2   Title  Pt to be walking with a rolling walker for a 30 minutes at a time to improve lymph circulation.     Time  8    Period  Weeks    Status  On-going      PT LONG TERM GOAL #3   Title  Pt to have acquired  new juxtafit to ensure proper compression to prevent future exacerbation of lymphedema.     Time  8    Period  Weeks    Status  On-going            Plan - 03/18/17 1446    Clinical Impression Statement  Pt comes to departement with bandages removed due to increased pain.  Pt has noted bruising just proximal to ankle.  This is where she has a significant amoutn of edema and induration causing a  significant conture with her distal ankle.  Therapist attempted to build this area up with cotton last session but obviously was not successful.  Five minutes of manual was completed on this area to attempt to decrease the induration.  This is the only area where induration remains although total volume in pt LE has not changed.    Pain is down from the 10/10 to a 8/10 now.  Pt has started a new antibiotic.      Rehab Potential  Good    PT Frequency  3x / week    PT Duration  6 weeks    PT  Treatment/Interventions  ADLs/Self Care Home Management;Patient/family education;Manual lymph drainage;Compression bandaging    PT Next Visit Plan  Assess area just proximal to ankle.    Continue wiith manual lymph drainage and compression bandaging.  Begin short stretch when able.     PT Home Exercise Plan  lymphedema exercises     Consulted and Agree with Plan of Care  Patient       Patient will benefit from skilled therapeutic intervention in order to improve the following deficits and impairments:  Pain, Decreased strength, Obesity, Difficulty walking, Decreased activity tolerance, Increased edema  Visit Diagnosis: Lymphedema, not elsewhere classified  Pain in left leg  Difficulty in walking, not elsewhere classified     Problem List Patient Active Problem List   Diagnosis Date Noted  . OAB (overactive bladder) 02/22/2017  . Lymphedema 01/26/2017  . Gastroesophageal reflux disease without esophagitis 01/26/2017  . Acquired hypothyroidism 01/26/2017  . Mixed hyperlipidemia 01/26/2017  . Pyrexia   . Sleeps in sitting position due to orthopnea 07/31/2015  . Acute on chronic systolic congestive heart failure ([place]) 06/09/2015  . Morbid obesity due to excess calories ([place]) 06/09/2015  . PVD (peripheral vascular disease) with claudication ([place]) 06/09/2015  . OSA (obstructive sleep apnea) 06/09/2015  . Long term (current) use of anticoagulants 06/19/2010  . Tachycardia-bradycardia syndrome ([place]) 11/18/2008  . PPM-St.Jude 11/18/2008  . Hyperlipidemia associated with type 2 diabetes mellitus ([place]) 02/21/2008  . HYPERTENSION, BENIGN 02/21/2008  . ATRIAL FIBRILLATION 02/21/2008    Rayetta Humphrey, PT CLT [phone] 03/18/2017, 2:53 PM  [place] [address] [place], [place], [address] Phone: [phone]   Fax:  [phone]  Name: Megan [name] MRN: [id number] Date of Birth: Jun 09, 1945

## 2017-03-21 ENCOUNTER — Encounter (HOSPITAL_COMMUNITY): Payer: Self-pay | Admitting: Physical Therapy

## 2017-03-21 ENCOUNTER — Ambulatory Visit (HOSPITAL_COMMUNITY): Payer: Medicare Other | Admitting: Physical Therapy

## 2017-03-21 DIAGNOSIS — M79605 Pain in left leg: Secondary | ICD-10-CM | POA: Diagnosis not present

## 2017-03-21 DIAGNOSIS — R262 Difficulty in walking, not elsewhere classified: Secondary | ICD-10-CM | POA: Diagnosis not present

## 2017-03-21 DIAGNOSIS — I890 Lymphedema, not elsewhere classified: Secondary | ICD-10-CM | POA: Diagnosis not present

## 2017-03-21 NOTE — Therapy (Signed)
[place], [place], [address] Phone: [phone]   Fax:  [phone]  Physical Therapy Treatment  Patient Details  Name: Megan [name] MRN: [id number] Date of Birth: 07-Apr-1945 Referring Provider: Madie Reno   Encounter Date: 03/21/2017  PT End of Session - 03/21/17 1113    Visit Number  4    Number of Visits  18    Date for PT Re-Evaluation  04/13/17    Authorization Type  Medicare    Authorization - Visit Number  4    Authorization - Number of Visits  10    PT Start Time  1030    PT Stop Time  1110    PT Time Calculation (min)  40 min    Activity Tolerance  Patient tolerated treatment well    Behavior During Therapy  [facility] for tasks assessed/performed       Past Medical History:  Diagnosis Date  . Acute diastolic heart failure ([place])   . Anxiety state, unspecified   . Congestive heart failure, unspecified   . Degenerative joint disease   . GERD (gastroesophageal reflux disease)   . Hyperlipidemia   . Hypertension   . Lymphedema   . Obesity   . Persistent atrial fibrillation (HCC)     post-termination pauses with afib s/p PPM  . Presence of permanent cardiac pacemaker   . Sinoatrial node dysfunction (HCC)    s/p PPM  . Sleep apnea    compliant with CPAP  . Unspecified hypothyroidism   . Unspecified venous (peripheral) insufficiency     Past Surgical History:  Procedure Laterality Date  . CATARACT EXTRACTION W/PHACO Left 09/09/2014   Procedure: CATARACT EXTRACTION PHACO AND INTRAOCULAR LENS PLACEMENT (IOC);  Surgeon: Tonny Branch, MD;  Location: [facility];  Service: Ophthalmology;  Laterality: Left;  CDE: 6.99  . CATARACT EXTRACTION W/PHACO Right 10/07/2014   Procedure: CATARACT EXTRACTION PHACO AND INTRAOCULAR LENS PLACEMENT RIGHT EYE;  Surgeon: Tonny Branch, MD;  Location: [facility];  Service: Ophthalmology;  Laterality: Right;  CDE:5.16  . PACEMAKER INSERTION     SJM by JA    There were no  vitals filed for this visit.  Subjective Assessment - 03/21/17 1112    Subjective  Pt states that the compression bandaging did not hurt her like they did so she was able to keep them on.     Pertinent History  lymphedema, HTN, AFib CHF  obesity,     Limitations  Standing;Walking    How long can you sit comfortably?  no  problem    How long can you stand comfortably?  no longer than a minute without support.    How long can you walk comfortably?  a couple of minutes with assistive device     Currently in Pain?  Yes    Pain Score  8     Pain Onset  1 to 4 weeks ago                      [facility] Adult PT Treatment/Exercise - 03/21/17 0001      Manual Therapy   Manual Therapy  Manual Lymphatic Drainage (MLD);Compression Bandaging    Manual therapy comments  completed seperately from all other aspects of treatment    Manual Lymphatic Drainage (MLD)  to include supraclavicular; deep and superfical abdominal followed by routing fluid using Lt inguinal axillary anastomosis and Lt leg     Compression Bandaging  foam ,  extra cotton and profore multilayer compression bandage system.                PT Short Term Goals - 03/16/17 1645      PT SHORT TERM GOAL #1   Title  Pt Lt LE volumes to be decreased by 3 cm to reduce risk of wounds    Time  4    Period  Weeks    Status  On-going      PT SHORT TERM GOAL #2   Title  PT Lt LE to no longer have heat or redness for evidence of decreased cellulitis.     Time  4    Period  Weeks    Status  On-going      PT SHORT TERM GOAL #3   Title  PT to state that she has called [employer] to acquire new juxtafit garment.     Time  4    Period  Weeks    Status  On-going      PT SHORT TERM GOAL #4   Title  PT to be ambulating with a rolling walker in order to reduce risk of falling     Time  2    Period  Weeks    Status  On-going        PT Long Term Goals - 03/16/17 1645      PT LONG TERM GOAL #1   Title  PT LT LE  volume to be decreased by 6 cm to decrease pain to no greater than a 2/10 to allow pt to stand for 5-10 minutes without having to sit down to make a small meal.     Time  8    Period  Weeks    Status  On-going      PT LONG TERM GOAL #2   Title  Pt to be walking with a rolling walker for a 30 minutes at a time to improve lymph circulation.     Time  8    Period  Weeks    Status  On-going      PT LONG TERM GOAL #3   Title  Pt to have acquired  new juxtafit to ensure proper compression to prevent future exacerbation of lymphedema.     Time  8    Period  Weeks    Status  On-going            Plan - 03/21/17 1115    Rehab Potential  Good    PT Frequency  3x / week    PT Duration  6 weeks    PT Treatment/Interventions  ADLs/Self Care Home Management;Patient/family education;Manual lymph drainage;Compression bandaging    PT Next Visit Plan     Continue wiith manual lymph drainage and compression bandaging.  Begin short stretch when able.     PT Home Exercise Plan  lymphedema exercises     Consulted and Agree with Plan of Care  Patient       Patient will benefit from skilled therapeutic intervention in order to improve the following deficits and impairments:  Pain, Decreased strength, Obesity, Difficulty walking, Decreased activity tolerance, Increased edema  Visit Diagnosis: Lymphedema, not elsewhere classified  Pain in left leg  Difficulty in walking, not elsewhere classified     Problem List Patient Active Problem List   Diagnosis Date Noted  . OAB (overactive bladder) 02/22/2017  . Lymphedema 01/26/2017  . Gastroesophageal reflux disease without esophagitis 01/26/2017  . Acquired hypothyroidism 01/26/2017  .  Mixed hyperlipidemia 01/26/2017  . Pyrexia   . Sleeps in sitting position due to orthopnea 07/31/2015  . Acute on chronic systolic congestive heart failure ([place]) 06/09/2015  . Morbid obesity due to excess calories ([place]) 06/09/2015  . PVD (peripheral vascular  disease) with claudication ([place]) 06/09/2015  . OSA (obstructive sleep apnea) 06/09/2015  . Long term (current) use of anticoagulants 06/19/2010  . Tachycardia-bradycardia syndrome ([place]) 11/18/2008  . PPM-St.Jude 11/18/2008  . Hyperlipidemia associated with type 2 diabetes mellitus ([place]) 02/21/2008  . HYPERTENSION, BENIGN 02/21/2008  . ATRIAL FIBRILLATION 02/21/2008    Rayetta Humphrey, PT CLT [phone] 03/21/2017, 11:17 AM  [place], [place], [address] Phone: [phone]   Fax:  [phone]  Name: Megan [name] MRN: [id number] Date of Birth: 01-Jan-1946

## 2017-03-24 ENCOUNTER — Ambulatory Visit (INDEPENDENT_AMBULATORY_CARE_PROVIDER_SITE_OTHER): Payer: Medicare Other | Admitting: *Deleted

## 2017-03-24 ENCOUNTER — Ambulatory Visit (HOSPITAL_COMMUNITY): Payer: Medicare Other | Admitting: Physical Therapy

## 2017-03-24 DIAGNOSIS — I4891 Unspecified atrial fibrillation: Secondary | ICD-10-CM

## 2017-03-24 DIAGNOSIS — M79605 Pain in left leg: Secondary | ICD-10-CM | POA: Diagnosis not present

## 2017-03-24 DIAGNOSIS — I890 Lymphedema, not elsewhere classified: Secondary | ICD-10-CM | POA: Diagnosis not present

## 2017-03-24 DIAGNOSIS — R262 Difficulty in walking, not elsewhere classified: Secondary | ICD-10-CM | POA: Diagnosis not present

## 2017-03-24 DIAGNOSIS — Z5181 Encounter for therapeutic drug level monitoring: Secondary | ICD-10-CM

## 2017-03-24 LAB — POCT INR: INR: 2.8

## 2017-03-24 NOTE — Patient Instructions (Signed)
Continue coumadin 1 tablet daily except 1 1/2 tablets on Sundays, Tuesdays and Thursdays Recheck in 3 weeks

## 2017-03-24 NOTE — Therapy (Signed)
[place], [place], [address] Phone: [phone]   Fax:  [phone]  Physical Therapy Treatment  Patient Details  Name: Megan [name] MRN: [id number] Date of Birth: 1945/12/18 Referring Provider: Madie Reno   Encounter Date: 03/24/2017  PT End of Session - 03/24/17 1444    Visit Number  5    Number of Visits  18    Date for PT Re-Evaluation  04/13/17    Authorization Type  Medicare    Authorization - Visit Number  5    Authorization - Number of Visits  10    PT Start Time  0950    PT Stop Time  1030    PT Time Calculation (min)  40 min    Activity Tolerance  Patient tolerated treatment well    Behavior During Therapy  [facility] for tasks assessed/performed       Past Medical History:  Diagnosis Date  . Acute diastolic heart failure ([place])   . Anxiety state, unspecified   . Congestive heart failure, unspecified   . Degenerative joint disease   . GERD (gastroesophageal reflux disease)   . Hyperlipidemia   . Hypertension   . Lymphedema   . Obesity   . Persistent atrial fibrillation (HCC)     post-termination pauses with afib s/p PPM  . Presence of permanent cardiac pacemaker   . Sinoatrial node dysfunction (HCC)    s/p PPM  . Sleep apnea    compliant with CPAP  . Unspecified hypothyroidism   . Unspecified venous (peripheral) insufficiency     Past Surgical History:  Procedure Laterality Date  . CATARACT EXTRACTION W/PHACO Left 09/09/2014   Procedure: CATARACT EXTRACTION PHACO AND INTRAOCULAR LENS PLACEMENT (IOC);  Surgeon: Tonny Branch, MD;  Location: [facility];  Service: Ophthalmology;  Laterality: Left;  CDE: 6.99  . CATARACT EXTRACTION W/PHACO Right 10/07/2014   Procedure: CATARACT EXTRACTION PHACO AND INTRAOCULAR LENS PLACEMENT RIGHT EYE;  Surgeon: Tonny Branch, MD;  Location: [facility];  Service: Ophthalmology;  Laterality: Right;  CDE:5.16  . PACEMAKER INSERTION     SJM by JA    There were no  vitals filed for this visit.                   [facility] Adult PT Treatment/Exercise - 03/24/17 1443      Manual Therapy   Manual Therapy  Manual Lymphatic Drainage (MLD);Compression Bandaging    Manual therapy comments  completed seperately from all other aspects of treatment    Manual Lymphatic Drainage (MLD)  to include supraclavicular; deep and superfical abdominal followed by routing fluid using Lt inguinal axillary anastomosis and upper leg     Compression Bandaging  foam , extra cotton and profore multilayer compression bandage system.                PT Short Term Goals - 03/16/17 1645      PT SHORT TERM GOAL #1   Title  Pt Lt LE volumes to be decreased by 3 cm to reduce risk of wounds    Time  4    Period  Weeks    Status  On-going      PT SHORT TERM GOAL #2   Title  PT Lt LE to no longer have heat or redness for evidence of decreased cellulitis.     Time  4    Period  Weeks    Status  On-going  PT SHORT TERM GOAL #3   Title  PT to state that she has called [employer] to acquire new juxtafit garment.     Time  4    Period  Weeks    Status  On-going      PT SHORT TERM GOAL #4   Title  PT to be ambulating with a rolling walker in order to reduce risk of falling     Time  2    Period  Weeks    Status  On-going        PT Long Term Goals - 03/16/17 1645      PT LONG TERM GOAL #1   Title  PT LT LE volume to be decreased by 6 cm to decrease pain to no greater than a 2/10 to allow pt to stand for 5-10 minutes without having to sit down to make a small meal.     Time  8    Period  Weeks    Status  On-going      PT LONG TERM GOAL #2   Title  Pt to be walking with a rolling walker for a 30 minutes at a time to improve lymph circulation.     Time  8    Period  Weeks    Status  On-going      PT LONG TERM GOAL #3   Title  Pt to have acquired  new juxtafit to ensure proper compression to prevent future exacerbation of lymphedema.     Time  8     Period  Weeks    Status  On-going            Plan - 03/24/17 1444    Clinical Impression Statement  Pt wtih most swelling at distal lateral ankle region and across dorsal foot.  Some erythemia but most concentrated latearl ankle as well as induration.  Pt also with sensitivity in these areas.  No sign/symptoms or infection.  Continued with profore compression.    Rehab Potential  Good    PT Frequency  3x / week    PT Duration  6 weeks    PT Treatment/Interventions  ADLs/Self Care Home Management;Patient/family education;Manual lymph drainage;Compression bandaging    PT Next Visit Plan     Continue wiith manual lymph drainage and compression bandaging.  Begin short stretch when able.     PT Home Exercise Plan  lymphedema exercises     Consulted and Agree with Plan of Care  Patient       Patient will benefit from skilled therapeutic intervention in order to improve the following deficits and impairments:  Pain, Decreased strength, Obesity, Difficulty walking, Decreased activity tolerance, Increased edema  Visit Diagnosis: Lymphedema, not elsewhere classified     Problem List Patient Active Problem List   Diagnosis Date Noted  . OAB (overactive bladder) 02/22/2017  . Lymphedema 01/26/2017  . Gastroesophageal reflux disease without esophagitis 01/26/2017  . Acquired hypothyroidism 01/26/2017  . Mixed hyperlipidemia 01/26/2017  . Pyrexia   . Sleeps in sitting position due to orthopnea 07/31/2015  . Acute on chronic systolic congestive heart failure ([place]) 06/09/2015  . Morbid obesity due to excess calories ([place]) 06/09/2015  . PVD (peripheral vascular disease) with claudication ([place]) 06/09/2015  . OSA (obstructive sleep apnea) 06/09/2015  . Long term (current) use of anticoagulants 06/19/2010  . Tachycardia-bradycardia syndrome ([place]) 11/18/2008  . PPM-St.Jude 11/18/2008  . Hyperlipidemia associated with type 2 diabetes mellitus ([place]) 02/21/2008  . HYPERTENSION, BENIGN  02/21/2008  . ATRIAL FIBRILLATION 02/21/2008   Teena Irani, PTA/CLT [phone]  Teena Irani 03/24/2017, 2:48 PM  [place], [place], [address] Phone: [phone]   Fax:  [phone]  Name: Megan [name] MRN: [id number] Date of Birth: 1945/11/05

## 2017-03-28 ENCOUNTER — Encounter (HOSPITAL_COMMUNITY): Payer: Self-pay | Admitting: Physical Therapy

## 2017-03-28 ENCOUNTER — Ambulatory Visit (HOSPITAL_COMMUNITY): Payer: Medicare Other | Admitting: Physical Therapy

## 2017-03-28 DIAGNOSIS — M79605 Pain in left leg: Secondary | ICD-10-CM | POA: Diagnosis not present

## 2017-03-28 DIAGNOSIS — I890 Lymphedema, not elsewhere classified: Secondary | ICD-10-CM

## 2017-03-28 DIAGNOSIS — R262 Difficulty in walking, not elsewhere classified: Secondary | ICD-10-CM | POA: Diagnosis not present

## 2017-03-28 NOTE — Therapy (Signed)
[facility] [place], [place], [address] Phone: [phone]   Fax:  [phone]  Physical Therapy Treatment  Patient Details  Name: Megan [name] MRN: [id number] Date of Birth: 1945/08/31 Referring Provider: Madie Reno   Encounter Date: 03/28/2017  PT End of Session - 03/28/17 1119    Visit Number  6    Number of Visits  18    Date for PT Re-Evaluation  04/13/17    Authorization Type  Medicare    Authorization - Visit Number  6    Authorization - Number of Visits  10    PT Start Time  6962    PT Stop Time  1114    PT Time Calculation (min)  44 min    Activity Tolerance  Patient tolerated treatment well    Behavior During Therapy  [facility] for tasks assessed/performed       Past Medical History:  Diagnosis Date  . Acute diastolic heart failure ([place])   . Anxiety state, unspecified   . Congestive heart failure, unspecified   . Degenerative joint disease   . GERD (gastroesophageal reflux disease)   . Hyperlipidemia   . Hypertension   . Lymphedema   . Obesity   . Persistent atrial fibrillation (HCC)     post-termination pauses with afib s/p PPM  . Presence of permanent cardiac pacemaker   . Sinoatrial node dysfunction (HCC)    s/p PPM  . Sleep apnea    compliant with CPAP  . Unspecified hypothyroidism   . Unspecified venous (peripheral) insufficiency     Past Surgical History:  Procedure Laterality Date  . CATARACT EXTRACTION W/PHACO Left 09/09/2014   Procedure: CATARACT EXTRACTION PHACO AND INTRAOCULAR LENS PLACEMENT (IOC);  Surgeon: Tonny Branch, MD;  Location: [facility];  Service: Ophthalmology;  Laterality: Left;  CDE: 6.99  . CATARACT EXTRACTION W/PHACO Right 10/07/2014   Procedure: CATARACT EXTRACTION PHACO AND INTRAOCULAR LENS PLACEMENT RIGHT EYE;  Surgeon: Tonny Branch, MD;  Location: [facility];  Service: Ophthalmology;  Laterality: Right;  CDE:5.16  . PACEMAKER INSERTION     SJM by JA    There were no  vitals filed for this visit.  Subjective Assessment - 03/28/17 1117    Subjective  Pt states that she had a lot of pain with the compression stocking and it felt as if the toe wrapping cut her under her little toe.      Pertinent History  lymphedema, HTN, AFib CHF  obesity,     Limitations  Standing;Walking    How long can you sit comfortably?  no  problem    How long can you stand comfortably?  no longer than a minute without support.    How long can you walk comfortably?  a couple of minutes with assistive device     Currently in Pain?  Yes    Pain Score  5     Pain Location  Leg    Pain Orientation  Left    Pain Descriptors / Indicators  Tightness    Pain Type  Chronic pain    Pain Onset  1 to 4 weeks ago                      [facility] Adult PT Treatment/Exercise - 03/28/17 0001      Manual Therapy   Manual Therapy  Manual Lymphatic Drainage (MLD);Compression Bandaging    Manual therapy comments  completed seperately from all other aspects  of treatment    Manual Lymphatic Drainage (MLD)  to include supraclavicular; deep and superfical abdominal followed by routing fluid using Lt inguinal axillary anastomosis and upper leg     Compression Bandaging  Short stretch bandaging  multiple layer with foam                PT Short Term Goals - 03/16/17 1645      PT SHORT TERM GOAL #1   Title  Pt Lt LE volumes to be decreased by 3 cm to reduce risk of wounds    Time  4    Period  Weeks    Status  On-going      PT SHORT TERM GOAL #2   Title  PT Lt LE to no longer have heat or redness for evidence of decreased cellulitis.     Time  4    Period  Weeks    Status  On-going      PT SHORT TERM GOAL #3   Title  PT to state that she has called [employer] to acquire new juxtafit garment.     Time  4    Period  Weeks    Status  On-going      PT SHORT TERM GOAL #4   Title  PT to be ambulating with a rolling walker in order to reduce risk of falling     Time  2     Period  Weeks    Status  On-going        PT Long Term Goals - 03/16/17 1645      PT LONG TERM GOAL #1   Title  PT LT LE volume to be decreased by 6 cm to decrease pain to no greater than a 2/10 to allow pt to stand for 5-10 minutes without having to sit down to make a small meal.     Time  8    Period  Weeks    Status  On-going      PT LONG TERM GOAL #2   Title  Pt to be walking with a rolling walker for a 30 minutes at a time to improve lymph circulation.     Time  8    Period  Weeks    Status  On-going      PT LONG TERM GOAL #3   Title  Pt to have acquired  new juxtafit to ensure proper compression to prevent future exacerbation of lymphedema.     Time  8    Period  Weeks    Status  On-going            Plan - 03/28/17 1119    Clinical Impression Statement  Pt comes in with two layer of compression wrapping and toe wrapping removed due to pain.  Pt short stretch bandages have arrived and should be more comfortable to pt.  Therapist inspected under little toe with no noted "cut" area.  Therapist did not wrap toes due to pt request this visit.  Overall LE induration continues to decrease.  Pt still has induration just superior to ankle but this is decreasing as well.      Rehab Potential  Good    PT Frequency  3x / week    PT Duration  6 weeks    PT Treatment/Interventions  ADLs/Self Care Home Management;Patient/family education;Manual lymph drainage;Compression bandaging    PT Next Visit Plan     Continue wiith manual lymph drainage and compression bandaging assess  comfort of short stretch bandages.     PT Home Exercise Plan  lymphedema exercises     Consulted and Agree with Plan of Care  Patient       Patient will benefit from skilled therapeutic intervention in order to improve the following deficits and impairments:  Pain, Decreased strength, Obesity, Difficulty walking, Decreased activity tolerance, Increased edema  Visit Diagnosis: Lymphedema, not elsewhere  classified  Pain in left leg  Difficulty in walking, not elsewhere classified     Problem List Patient Active Problem List   Diagnosis Date Noted  . OAB (overactive bladder) 02/22/2017  . Lymphedema 01/26/2017  . Gastroesophageal reflux disease without esophagitis 01/26/2017  . Acquired hypothyroidism 01/26/2017  . Mixed hyperlipidemia 01/26/2017  . Pyrexia   . Sleeps in sitting position due to orthopnea 07/31/2015  . Acute on chronic systolic congestive heart failure ([place]) 06/09/2015  . Morbid obesity due to excess calories ([place]) 06/09/2015  . PVD (peripheral vascular disease) with claudication ([place]) 06/09/2015  . OSA (obstructive sleep apnea) 06/09/2015  . Long term (current) use of anticoagulants 06/19/2010  . Tachycardia-bradycardia syndrome ([place]) 11/18/2008  . PPM-St.Jude 11/18/2008  . Hyperlipidemia associated with type 2 diabetes mellitus ([place]) 02/21/2008  . HYPERTENSION, BENIGN 02/21/2008  . ATRIAL FIBRILLATION 02/21/2008    Rayetta Humphrey, PT CLT [phone] 03/28/2017, 11:22 AM  [facility] [address] [place], [place], [address] Phone: [phone]   Fax:  [phone]  Name: Megan [name] MRN: [id number] Date of Birth: 05/29/1945

## 2017-03-30 ENCOUNTER — Other Ambulatory Visit: Payer: Self-pay | Admitting: Internal Medicine

## 2017-03-30 ENCOUNTER — Encounter (HOSPITAL_COMMUNITY): Payer: Self-pay | Admitting: Physical Therapy

## 2017-03-30 ENCOUNTER — Ambulatory Visit (HOSPITAL_COMMUNITY): Payer: Medicare Other | Attending: Family Medicine | Admitting: Physical Therapy

## 2017-03-30 DIAGNOSIS — M79605 Pain in left leg: Secondary | ICD-10-CM | POA: Insufficient documentation

## 2017-03-30 DIAGNOSIS — R262 Difficulty in walking, not elsewhere classified: Secondary | ICD-10-CM

## 2017-03-30 DIAGNOSIS — I890 Lymphedema, not elsewhere classified: Secondary | ICD-10-CM | POA: Diagnosis not present

## 2017-03-30 NOTE — Therapy (Signed)
[place], [place], [address] Phone: [phone]   Fax:  [phone]  Physical Therapy Treatment  Patient Details  Name: Megan [name] MRN: [id number] Date of Birth: 1945-12-11 Referring Provider: Madie Reno   Encounter Date: 03/30/2017  PT End of Session - 03/30/17 0927    Visit Number  7    Number of Visits  18    Date for PT Re-Evaluation  04/13/17    Authorization Type  Medicare    Authorization - Visit Number  7    Authorization - Number of Visits  10    PT Start Time  0820    PT Stop Time  0910    PT Time Calculation (min)  50 min    Activity Tolerance  Patient tolerated treatment well    Behavior During Therapy  [facility] for tasks assessed/performed       Past Medical History:  Diagnosis Date  . Acute diastolic heart failure ([place])   . Anxiety state, unspecified   . Congestive heart failure, unspecified   . Degenerative joint disease   . GERD (gastroesophageal reflux disease)   . Hyperlipidemia   . Hypertension   . Lymphedema   . Obesity   . Persistent atrial fibrillation (HCC)     post-termination pauses with afib s/p PPM  . Presence of permanent cardiac pacemaker   . Sinoatrial node dysfunction (HCC)    s/p PPM  . Sleep apnea    compliant with CPAP  . Unspecified hypothyroidism   . Unspecified venous (peripheral) insufficiency     Past Surgical History:  Procedure Laterality Date  . CATARACT EXTRACTION W/PHACO Left 09/09/2014   Procedure: CATARACT EXTRACTION PHACO AND INTRAOCULAR LENS PLACEMENT (IOC);  Surgeon: Tonny Branch, MD;  Location: [facility];  Service: Ophthalmology;  Laterality: Left;  CDE: 6.99  . CATARACT EXTRACTION W/PHACO Right 10/07/2014   Procedure: CATARACT EXTRACTION PHACO AND INTRAOCULAR LENS PLACEMENT RIGHT EYE;  Surgeon: Tonny Branch, MD;  Location: [facility];  Service: Ophthalmology;  Laterality: Right;  CDE:5.16  . PACEMAKER INSERTION     SJM by JA    There were no  vitals filed for this visit.  Subjective Assessment - 03/30/17 0847    Subjective  Pt states that the short stretch bandages were much more comfortable.      Pertinent History  lymphedema, HTN, AFib CHF  obesity,     Limitations  Standing;Walking    How long can you sit comfortably?  no  problem    How long can you stand comfortably?  no longer than a minute without support.    How long can you walk comfortably?  a couple of minutes with assistive device     Currently in Pain?  Yes    Pain Score  2     Pain Location  Leg    Pain Orientation  Left    Pain Descriptors / Indicators  Aching    Pain Type  Chronic pain    Pain Onset  1 to 4 weeks ago            LYMPHEDEMA/ONCOLOGY QUESTIONNAIRE - 03/30/17 0848      Left Lower Extremity Lymphedema   10 cm Proximal to Suprapatella  79 cm    At Midpatella/Popliteal Crease  77 cm    30 cm Proximal to Floor at Lateral Plantar Foot  54.2 cm    20 cm Proximal to Floor at Lateral Plantar Foot  42 cm  10 cm Proximal to Floor at Lateral Malleoli  35.7 cm    Circumference of ankle/heel  32.8 cm.    5 cm Proximal to 1st MTP Joint  24.8 cm    Across MTP Joint  25 cm    Around Proximal Great Toe  9.2 cm               [facility] Adult PT Treatment/Exercise - 03/30/17 0001      Manual Therapy   Manual Therapy  Manual Lymphatic Drainage (MLD);Compression Bandaging    Manual therapy comments  completed seperately from all other aspects of treatment    Manual Lymphatic Drainage (MLD)  to include supraclavicular; deep and superfical abdominal followed by routing fluid using Lt inguinal axillary anastomosis and upper leg     Compression Bandaging  Short stretch bandaging  multiple layer with foam                PT Short Term Goals - 03/16/17 1645      PT SHORT TERM GOAL #1   Title  Pt Lt LE volumes to be decreased by 3 cm to reduce risk of wounds    Time  4    Period  Weeks    Status  On-going      PT SHORT TERM GOAL #2    Title  PT Lt LE to no longer have heat or redness for evidence of decreased cellulitis.     Time  4    Period  Weeks    Status  On-going      PT SHORT TERM GOAL #3   Title  PT to state that she has called [employer] to acquire new juxtafit garment.     Time  4    Period  Weeks    Status  On-going      PT SHORT TERM GOAL #4   Title  PT to be ambulating with a rolling walker in order to reduce risk of falling     Time  2    Period  Weeks    Status  On-going        PT Long Term Goals - 03/16/17 1645      PT LONG TERM GOAL #1   Title  PT LT LE volume to be decreased by 6 cm to decrease pain to no greater than a 2/10 to allow pt to stand for 5-10 minutes without having to sit down to make a small meal.     Time  8    Period  Weeks    Status  On-going      PT LONG TERM GOAL #2   Title  Pt to be walking with a rolling walker for a 30 minutes at a time to improve lymph circulation.     Time  8    Period  Weeks    Status  On-going      PT LONG TERM GOAL #3   Title  Pt to have acquired  new juxtafit to ensure proper compression to prevent future exacerbation of lymphedema.     Time  8    Period  Weeks    Status  On-going            Plan - 03/30/17 1096    Clinical Impression Statement  Pt much more comfortable with short stretch.  Pt remeasured; upper thigh has increased fluid.  Therapist spoke to pt about the need to wrap thigh area as well.  Pt was  agreeable to this .  Therapist gave pt a list of the bandages that she needed to purchase.  Pt is going to [facility] on Friday therefore therapist will give pt a prescripton for juxtafit thigh high for pt to have [facility]. sign.   Pt  has open area along lateral ankle where blood blister has opened therapist appied medihoney and 4x4 to this area prior to bandaging.     Rehab Potential  Good    PT Frequency  3x / week    PT Duration  6 weeks    PT Treatment/Interventions  ADLs/Self Care Home Management;Patient/family education;Manual lymph  drainage;Compression bandaging    PT Next Visit Plan     Continue wiith manual lymph drainage and compression bandaging; begin thigh as soon as patient has acquired bandages.     PT Home Exercise Plan  lymphedema exercises     Consulted and Agree with Plan of Care  Patient       Patient will benefit from skilled therapeutic intervention in order to improve the following deficits and impairments:  Pain, Decreased strength, Obesity, Difficulty walking, Decreased activity tolerance, Increased edema  Visit Diagnosis: Lymphedema, not elsewhere classified  Pain in left leg  Difficulty in walking, not elsewhere classified     Problem List Patient Active Problem List   Diagnosis Date Noted  . OAB (overactive bladder) 02/22/2017  . Lymphedema 01/26/2017  . Gastroesophageal reflux disease without esophagitis 01/26/2017  . Acquired hypothyroidism 01/26/2017  . Mixed hyperlipidemia 01/26/2017  . Pyrexia   . Sleeps in sitting position due to orthopnea 07/31/2015  . Acute on chronic systolic congestive heart failure ([place]) 06/09/2015  . Morbid obesity due to excess calories ([place]) 06/09/2015  . PVD (peripheral vascular disease) with claudication ([place]) 06/09/2015  . OSA (obstructive sleep apnea) 06/09/2015  . Long term (current) use of anticoagulants 06/19/2010  . Tachycardia-bradycardia syndrome ([place]) 11/18/2008  . PPM-St.Jude 11/18/2008  . Hyperlipidemia associated with type 2 diabetes mellitus ([place]) 02/21/2008  . HYPERTENSION, BENIGN 02/21/2008  . ATRIAL FIBRILLATION 02/21/2008    Rayetta Humphrey, PT CLT [phone] 03/30/2017, 9:34 AM  [facility] [address] [place], [place], [address] Phone: [phone]   Fax:  [phone]  Name: Megan [name] MRN: [id number] Date of Birth: 1945/05/01

## 2017-03-31 ENCOUNTER — Telehealth: Payer: Self-pay | Admitting: Physician Assistant

## 2017-03-31 ENCOUNTER — Encounter (HOSPITAL_COMMUNITY): Payer: Self-pay | Admitting: Physical Therapy

## 2017-03-31 ENCOUNTER — Encounter (INDEPENDENT_AMBULATORY_CARE_PROVIDER_SITE_OTHER): Payer: Self-pay | Admitting: Orthopaedic Surgery

## 2017-03-31 ENCOUNTER — Ambulatory Visit (INDEPENDENT_AMBULATORY_CARE_PROVIDER_SITE_OTHER): Payer: Medicare Other

## 2017-03-31 ENCOUNTER — Ambulatory Visit (HOSPITAL_COMMUNITY): Payer: Medicare Other | Admitting: Physical Therapy

## 2017-03-31 ENCOUNTER — Ambulatory Visit (INDEPENDENT_AMBULATORY_CARE_PROVIDER_SITE_OTHER): Payer: Medicare Other | Admitting: Orthopaedic Surgery

## 2017-03-31 VITALS — BP 129/84 | HR 78 | Ht 60.0 in | Wt 300.0 lb

## 2017-03-31 DIAGNOSIS — I890 Lymphedema, not elsewhere classified: Secondary | ICD-10-CM | POA: Diagnosis not present

## 2017-03-31 DIAGNOSIS — R262 Difficulty in walking, not elsewhere classified: Secondary | ICD-10-CM

## 2017-03-31 DIAGNOSIS — M79605 Pain in left leg: Secondary | ICD-10-CM | POA: Diagnosis not present

## 2017-03-31 DIAGNOSIS — S6991XA Unspecified injury of right wrist, hand and finger(s), initial encounter: Secondary | ICD-10-CM | POA: Diagnosis not present

## 2017-03-31 NOTE — Progress Notes (Signed)
Office Visit Note   Patient: Megan [name]           Date of Birth: [birth year]           MRN: [id number] Visit Date: 03/31/2017              Requested by: Janora Norlander, DO [address] [place], [place] [address] PCP: Terald Sleeper, PA-C   Assessment & Plan: Visit Diagnoses:  1. Injury of right thumb, initial encounter     Plan: Recommended for thumb including elevation.  She is continuing use.  Swelling and ecchymosis after the fall may be related to her warfarin.  Treatment and return if she has increased problems.  Follow-Up Instructions: No Follow-up on file.   Orders:  Orders Placed This Encounter  Procedures  . XR Finger Thumb Right   No orders of the defined types were placed in this encounter.     Procedures: No procedures performed   Clinical Data: No additional findings.   Subjective: Chief Complaint  Patient presents with  . Finger Injury    right thumb injured 02/27/17    HPI 72 year old female fell 02/28/1999 right hand with right thumb pain..  03/11/2017 x-rays at [facility] clinic negative for acute fracture.  Patient is on a blood thinner.  She also has a history of heart failure ,falls x2 in the last 6 months.  Review of Systems for hyperlipidemia, hypertension, PAD, sleep apnea atrial fib, hypertension cataracts, pacemaker, sleep apnea, thyroid condition otherwise negative as it pertains to  HPI.   Objective: Vital Signs: BP 129/84   Pulse 78   Ht 5' (1.524 m)   Wt 300 lb (136.1 kg)   BMI 58.59 kg/m   Physical Exam  Constitutional: She is oriented to person, place, and time. She appears well-developed.  HENT:  Head: Normocephalic.  Right Ear: External ear normal.  Left Ear: External ear normal.  Eyes: Pupils are equal, round, and reactive to light.  Neck: No tracheal deviation present. No thyromegaly present.  Cardiovascular: Normal rate.  Pulmonary/Chest: Effort normal.  Abdominal: Soft.  Neurological: She is alert  and oriented to person, place, and time.  Skin: Skin is warm and dry.  Psychiatric: She has a normal mood and affect. Her behavior is normal.    Ortho Exam patient has good elbow range of motion good cervical range of motion.  There is tenderness at the carpal phalangeal joint.  Collateral ligaments are stable.  Sensory and flexor function is intact.  No triggering.  Specialty Comments:  No specialty comments available.  Imaging: No results found.   PMFS History: Patient Active Problem List   Diagnosis Date Noted  . OAB (overactive bladder) 02/22/2017  . Lymphedema 01/26/2017  . Gastroesophageal reflux disease without esophagitis 01/26/2017  . Acquired hypothyroidism 01/26/2017  . Mixed hyperlipidemia 01/26/2017  . Pyrexia   . Sleeps in sitting position due to orthopnea 07/31/2015  . Acute on chronic systolic congestive heart failure ([place]) 06/09/2015  . Morbid obesity due to excess calories ([place]) 06/09/2015  . PVD (peripheral vascular disease) with claudication ([place]) 06/09/2015  . OSA (obstructive sleep apnea) 06/09/2015  . Long term (current) use of anticoagulants 06/19/2010  . Tachycardia-bradycardia syndrome ([place]) 11/18/2008  . PPM-St.Jude 11/18/2008  . Hyperlipidemia associated with type 2 diabetes mellitus ([place]) 02/21/2008  . HYPERTENSION, BENIGN 02/21/2008  . ATRIAL FIBRILLATION 02/21/2008   Past Medical History:  Diagnosis Date  . Acute diastolic heart failure ([place])   . Anxiety  state, unspecified   . Congestive heart failure, unspecified   . Degenerative joint disease   . GERD (gastroesophageal reflux disease)   . Hyperlipidemia   . Hypertension   . Lymphedema   . Obesity   . Persistent atrial fibrillation (HCC)     post-termination pauses with afib s/p PPM  . Presence of permanent cardiac pacemaker   . Sinoatrial node dysfunction (HCC)    s/p PPM  . Sleep apnea    compliant with CPAP  . Unspecified hypothyroidism   . Unspecified venous (peripheral)  insufficiency     Family History  Problem Relation Age of Onset  . Parkinson's disease Unknown   . CVA Unknown   . Parkinson's disease Father     Past Surgical History:  Procedure Laterality Date  . CATARACT EXTRACTION W/PHACO Left 09/09/2014   Procedure: CATARACT EXTRACTION PHACO AND INTRAOCULAR LENS PLACEMENT (IOC);  Surgeon: Tonny Branch, MD;  Location: [facility];  Service: Ophthalmology;  Laterality: Left;  CDE: 6.99  . CATARACT EXTRACTION W/PHACO Right 10/07/2014   Procedure: CATARACT EXTRACTION PHACO AND INTRAOCULAR LENS PLACEMENT RIGHT EYE;  Surgeon: Tonny Branch, MD;  Location: [facility];  Service: Ophthalmology;  Laterality: Right;  CDE:5.16  . PACEMAKER INSERTION     SJM by [place] History   Occupational History  . Occupation: RETIRED    Employer: RETIRED  Tobacco Use  . Smoking status: Former Smoker    Packs/day: 0.50    Years: 10.00    Pack years: 5.00    Types: Cigarettes    Last attempt to quit: 03/29/1978    Years since quitting: 39.0  . Smokeless tobacco: Never Used  Substance and Sexual Activity  . Alcohol use: No    Alcohol/week: 0.0 oz  . Drug use: No  . Sexual activity: No    Birth control/protection: None

## 2017-03-31 NOTE — Therapy (Signed)
[place], [place], [address] Phone: [phone]   Fax:  [phone]  Physical Therapy Treatment  Patient Details  Name: Megan [name] MRN: [id number] Date of Birth: 09-23-1945 Referring Provider: Madie Reno   Encounter Date: 03/31/2017  PT End of Session - 03/31/17 0911    Visit Number  8    Number of Visits  18    Date for PT Re-Evaluation  04/13/17    Authorization Type  Medicare    Authorization - Visit Number  8    Authorization - Number of Visits  10    PT Start Time  0818    PT Stop Time  0905    PT Time Calculation (min)  47 min    Activity Tolerance  Patient tolerated treatment well    Behavior During Therapy  [facility] for tasks assessed/performed       Past Medical History:  Diagnosis Date  . Acute diastolic heart failure ([place])   . Anxiety state, unspecified   . Congestive heart failure, unspecified   . Degenerative joint disease   . GERD (gastroesophageal reflux disease)   . Hyperlipidemia   . Hypertension   . Lymphedema   . Obesity   . Persistent atrial fibrillation (HCC)     post-termination pauses with afib s/p PPM  . Presence of permanent cardiac pacemaker   . Sinoatrial node dysfunction (HCC)    s/p PPM  . Sleep apnea    compliant with CPAP  . Unspecified hypothyroidism   . Unspecified venous (peripheral) insufficiency     Past Surgical History:  Procedure Laterality Date  . CATARACT EXTRACTION W/PHACO Left 09/09/2014   Procedure: CATARACT EXTRACTION PHACO AND INTRAOCULAR LENS PLACEMENT (IOC);  Surgeon: Tonny Branch, MD;  Location: [facility];  Service: Ophthalmology;  Laterality: Left;  CDE: 6.99  . CATARACT EXTRACTION W/PHACO Right 10/07/2014   Procedure: CATARACT EXTRACTION PHACO AND INTRAOCULAR LENS PLACEMENT RIGHT EYE;  Surgeon: Tonny Branch, MD;  Location: [facility];  Service: Ophthalmology;  Laterality: Right;  CDE:5.16  . PACEMAKER INSERTION     SJM by JA    There were no  vitals filed for this visit.  Subjective Assessment - 03/31/17 0909    Subjective  Pt states that she had to take the toe bandages off.  She is going to get the short stretch for her thigh today at [facility].     Pertinent History  lymphedema, HTN, AFib CHF  obesity,     Limitations  Standing;Walking    How long can you sit comfortably?  no  problem    How long can you stand comfortably?  no longer than a minute without support.    How long can you walk comfortably?  a couple of minutes with assistive device     Pain Score  1     Pain Onset  1 to 4 weeks ago            LYMPHEDEMA/ONCOLOGY QUESTIONNAIRE - 03/30/17 0848      Left Lower Extremity Lymphedema   10 cm Proximal to Suprapatella  79 cm    At Midpatella/Popliteal Crease  77 cm    30 cm Proximal to Floor at Lateral Plantar Foot  54.2 cm    20 cm Proximal to Floor at Lateral Plantar Foot  42 cm    10 cm Proximal to Floor at Lateral Malleoli  35.7 cm    Circumference of ankle/heel  32.8 cm.  5 cm Proximal to 1st MTP Joint  24.8 cm    Across MTP Joint  25 cm    Around Proximal Great Toe  9.2 cm               [facility] Adult PT Treatment/Exercise - 03/31/17 0001      Manual Therapy   Manual Therapy  Manual Lymphatic Drainage (MLD);Compression Bandaging    Manual therapy comments  completed seperately from all other aspects of treatment    Manual Lymphatic Drainage (MLD)  to include supraclavicular; deep and superfical abdominal followed by routing fluid using Lt inguinal axillary anastomosis and upper leg     Compression Bandaging  Short stretch bandaging  multiple layer with foam                PT Short Term Goals - 03/16/17 1645      PT SHORT TERM GOAL #1   Title  Pt Lt LE volumes to be decreased by 3 cm to reduce risk of wounds    Time  4    Period  Weeks    Status  On-going      PT SHORT TERM GOAL #2   Title  PT Lt LE to no longer have heat or redness for evidence of decreased cellulitis.     Time   4    Period  Weeks    Status  On-going      PT SHORT TERM GOAL #3   Title  PT to state that she has called [employer] to acquire new juxtafit garment.     Time  4    Period  Weeks    Status  On-going      PT SHORT TERM GOAL #4   Title  PT to be ambulating with a rolling walker in order to reduce risk of falling     Time  2    Period  Weeks    Status  On-going        PT Long Term Goals - 03/16/17 1645      PT LONG TERM GOAL #1   Title  PT LT LE volume to be decreased by 6 cm to decrease pain to no greater than a 2/10 to allow pt to stand for 5-10 minutes without having to sit down to make a small meal.     Time  8    Period  Weeks    Status  On-going      PT LONG TERM GOAL #2   Title  Pt to be walking with a rolling walker for a 30 minutes at a time to improve lymph circulation.     Time  8    Period  Weeks    Status  On-going      PT LONG TERM GOAL #3   Title  Pt to have acquired  new juxtafit to ensure proper compression to prevent future exacerbation of lymphedema.     Time  8    Period  Weeks    Status  On-going            Plan - 03/31/17 0911    Clinical Impression Statement  Area superior to ankle induration is slowly disappating, however she does now have a wound .8x 1.5 where the blood blister was.; overall this area looks better.  Wound covered with honey and 2x2 priot to bandaging Pt will benefit from compression bandaging toe to thigh.     Rehab Potential  Good  PT Frequency  3x / week    PT Duration  6 weeks    PT Treatment/Interventions  ADLs/Self Care Home Management;Patient/family education;Manual lymph drainage;Compression bandaging    PT Next Visit Plan  Keep assessing wound and begin compression bandaging toe to thigh.    PT Home Exercise Plan  lymphedema exercises     Consulted and Agree with Plan of Care  Patient       Patient will benefit from skilled therapeutic intervention in order to improve the following deficits and impairments:   Pain, Decreased strength, Obesity, Difficulty walking, Decreased activity tolerance, Increased edema  Visit Diagnosis: Lymphedema, not elsewhere classified  Pain in left leg  Difficulty in walking, not elsewhere classified     Problem List Patient Active Problem List   Diagnosis Date Noted  . OAB (overactive bladder) 02/22/2017  . Lymphedema 01/26/2017  . Gastroesophageal reflux disease without esophagitis 01/26/2017  . Acquired hypothyroidism 01/26/2017  . Mixed hyperlipidemia 01/26/2017  . Pyrexia   . Sleeps in sitting position due to orthopnea 07/31/2015  . Acute on chronic systolic congestive heart failure ([place]) 06/09/2015  . Morbid obesity due to excess calories ([place]) 06/09/2015  . PVD (peripheral vascular disease) with claudication ([place]) 06/09/2015  . OSA (obstructive sleep apnea) 06/09/2015  . Long term (current) use of anticoagulants 06/19/2010  . Tachycardia-bradycardia syndrome ([place]) 11/18/2008  . PPM-St.Jude 11/18/2008  . Hyperlipidemia associated with type 2 diabetes mellitus ([place]) 02/21/2008  . HYPERTENSION, BENIGN 02/21/2008  . ATRIAL FIBRILLATION 02/21/2008    Rayetta Humphrey, PT CLT [phone] 03/31/2017, 9:18 AM  [place], [place], [address] Phone: [phone]   Fax:  [phone]  Name: Megan [name] MRN: [id number] Date of Birth: October 14, 1945

## 2017-04-01 ENCOUNTER — Ambulatory Visit: Payer: Medicare Other | Admitting: Physician Assistant

## 2017-04-04 ENCOUNTER — Encounter (INDEPENDENT_AMBULATORY_CARE_PROVIDER_SITE_OTHER): Payer: Self-pay | Admitting: Orthopaedic Surgery

## 2017-04-04 ENCOUNTER — Ambulatory Visit (HOSPITAL_COMMUNITY): Payer: Medicare Other | Admitting: Physical Therapy

## 2017-04-04 DIAGNOSIS — R262 Difficulty in walking, not elsewhere classified: Secondary | ICD-10-CM | POA: Diagnosis not present

## 2017-04-04 DIAGNOSIS — I890 Lymphedema, not elsewhere classified: Secondary | ICD-10-CM

## 2017-04-04 DIAGNOSIS — M79605 Pain in left leg: Secondary | ICD-10-CM | POA: Diagnosis not present

## 2017-04-04 NOTE — Therapy (Signed)
[place] [address] [place], [place], [address] Phone: [phone]   Fax:  [phone]  Physical Therapy Treatment  Patient Details  Name: Megan [name] MRN: [id number] Date of Birth: [birth year] Referring Provider: Madie Reno   Encounter Date: 04/04/2017  PT End of Session - 04/04/17 1212    Visit Number  9    Number of Visits  18    Date for PT Re-Evaluation  04/13/17    Authorization Type  Medicare    Authorization - Visit Number  9    Authorization - Number of Visits  10    PT Start Time  0950    PT Stop Time  1040    PT Time Calculation (min)  50 min    Equipment Utilized During Treatment  Gait belt    Activity Tolerance  Patient tolerated treatment well    Behavior During Therapy  [facility] for tasks assessed/performed       Past Medical History:  Diagnosis Date  . Acute diastolic heart failure ([place])   . Anxiety state, unspecified   . Congestive heart failure, unspecified   . Degenerative joint disease   . GERD (gastroesophageal reflux disease)   . Hyperlipidemia   . Hypertension   . Lymphedema   . Obesity   . Persistent atrial fibrillation (HCC)     post-termination pauses with afib s/p PPM  . Presence of permanent cardiac pacemaker   . Sinoatrial node dysfunction (HCC)    s/p PPM  . Sleep apnea    compliant with CPAP  . Unspecified hypothyroidism   . Unspecified venous (peripheral) insufficiency     Past Surgical History:  Procedure Laterality Date  . CATARACT EXTRACTION W/PHACO Left 09/09/2014   Procedure: CATARACT EXTRACTION PHACO AND INTRAOCULAR LENS PLACEMENT (IOC);  Surgeon: Tonny Branch, MD;  Location: [facility];  Service: Ophthalmology;  Laterality: Left;  CDE: 6.99  . CATARACT EXTRACTION W/PHACO Right 10/07/2014   Procedure: CATARACT EXTRACTION PHACO AND INTRAOCULAR LENS PLACEMENT RIGHT EYE;  Surgeon: Tonny Branch, MD;  Location: [facility];  Service: Ophthalmology;  Laterality: Right;  CDE:5.16  . PACEMAKER  INSERTION     SJM by JA    There were no vitals filed for this visit.  Subjective Assessment - 04/04/17 1159    Subjective  PT states she did not order her bandages or make it by the pharmacy to get any.  States she will try to get by there before Wednesday and was also going to look into buying one juxtafit garment at this time.  States she has no pain currently in her Lt LE, just general arthritis throughout.     Currently in Pain?  No/denies            LYMPHEDEMA/ONCOLOGY QUESTIONNAIRE - 04/04/17 1215      Left Lower Extremity Lymphedema   Other  wound measurements 0.8X1.5X 0.3 cm             Wound Therapy - 04/04/17 1211    Pain Score  0-No pain    Wound Therapy - Clinical Statement  no pain, just tenderness    Dressing   medihoney gauze       [facility] Adult PT Treatment/Exercise - 04/04/17 0001      Manual Therapy   Manual Therapy  Manual Lymphatic Drainage (MLD);Compression Bandaging    Manual therapy comments  completed seperately from all other aspects of treatment    Manual Lymphatic Drainage (MLD)  to include supraclavicular;  deep and superfical abdominal followed by routing fluid using Lt inguinal axillary anastomosis and upper leg     Compression Bandaging  Short stretch bandaging  multiple layer with foam                PT Short Term Goals - 03/16/17 1645      PT SHORT TERM GOAL #1   Title  Pt Lt LE volumes to be decreased by 3 cm to reduce risk of wounds    Time  4    Period  Weeks    Status  On-going      PT SHORT TERM GOAL #2   Title  PT Lt LE to no longer have heat or redness for evidence of decreased cellulitis.     Time  4    Period  Weeks    Status  On-going      PT SHORT TERM GOAL #3   Title  PT to state that she has called [employer] to acquire new juxtafit garment.     Time  4    Period  Weeks    Status  On-going      PT SHORT TERM GOAL #4   Title  PT to be ambulating with a rolling walker in order to reduce risk of  falling     Time  2    Period  Weeks    Status  On-going        PT Long Term Goals - 03/16/17 1645      PT LONG TERM GOAL #1   Title  PT LT LE volume to be decreased by 6 cm to decrease pain to no greater than a 2/10 to allow pt to stand for 5-10 minutes without having to sit down to make a small meal.     Time  8    Period  Weeks    Status  On-going      PT LONG TERM GOAL #2   Title  Pt to be walking with a rolling walker for a 30 minutes at a time to improve lymph circulation.     Time  8    Period  Weeks    Status  On-going      PT LONG TERM GOAL #3   Title  Pt to have acquired  new juxtafit to ensure proper compression to prevent future exacerbation of lymphedema.     Time  8    Period  Weeks    Status  On-going            Plan - 04/04/17 1212    Clinical Impression Statement  short stretch still intact.  Cleansed wound and entire distal LE well prior to massage/moisturizing.  Debrided borders of wound and removed slough to promote approximation.  continued with medihoney gel and multilayer short stretch.  Encouraged to get bandages to wrap to thigh as with noted induration medial thigh.  Used extra padding at wound area to encourage reduced induration and promote healing.     Rehab Potential  Good    PT Frequency  3x / week    PT Duration  6 weeks    PT Treatment/Interventions  ADLs/Self Care Home Management;Patient/family education;Manual lymph drainage;Compression bandaging    PT Next Visit Plan  Keep assessing wound and begin compression bandaging toe to thigh when patient acquires additional bandages.     PT Home Exercise Plan  lymphedema exercises     Consulted and Agree with Plan of Care  Patient       Patient will benefit from skilled therapeutic intervention in order to improve the following deficits and impairments:  Pain, Decreased strength, Obesity, Difficulty walking, Decreased activity tolerance, Increased edema  Visit Diagnosis: Lymphedema, not  elsewhere classified  Pain in left leg  Difficulty in walking, not elsewhere classified     Problem List Patient Active Problem List   Diagnosis Date Noted  . OAB (overactive bladder) 02/22/2017  . Lymphedema 01/26/2017  . Gastroesophageal reflux disease without esophagitis 01/26/2017  . Acquired hypothyroidism 01/26/2017  . Mixed hyperlipidemia 01/26/2017  . Pyrexia   . Sleeps in sitting position due to orthopnea 07/31/2015  . Acute on chronic systolic congestive heart failure ([place]) 06/09/2015  . Morbid obesity due to excess calories ([place]) 06/09/2015  . PVD (peripheral vascular disease) with claudication ([place]) 06/09/2015  . OSA (obstructive sleep apnea) 06/09/2015  . Long term (current) use of anticoagulants 06/19/2010  . Tachycardia-bradycardia syndrome ([place]) 11/18/2008  . PPM-St.Jude 11/18/2008  . Hyperlipidemia associated with type 2 diabetes mellitus ([place]) 02/21/2008  . HYPERTENSION, BENIGN 02/21/2008  . ATRIAL FIBRILLATION 02/21/2008   Teena Irani, PTA/CLT [phone]  Teena Irani 04/04/2017, 12:16 PM  [place], [place], [address] Phone: [phone]   Fax:  [phone]  Name: Megan [name] MRN: [id number] Date of Birth: [birth year]

## 2017-04-06 ENCOUNTER — Other Ambulatory Visit: Payer: Self-pay | Admitting: Family Medicine

## 2017-04-06 ENCOUNTER — Encounter (HOSPITAL_COMMUNITY): Payer: Medicare Other | Admitting: Physical Therapy

## 2017-04-06 ENCOUNTER — Telehealth: Payer: Self-pay | Admitting: Physician Assistant

## 2017-04-06 ENCOUNTER — Ambulatory Visit (HOSPITAL_COMMUNITY): Payer: Medicare Other | Admitting: Physical Therapy

## 2017-04-06 ENCOUNTER — Encounter (HOSPITAL_COMMUNITY): Payer: Self-pay | Admitting: Physical Therapy

## 2017-04-06 DIAGNOSIS — R262 Difficulty in walking, not elsewhere classified: Secondary | ICD-10-CM

## 2017-04-06 DIAGNOSIS — I890 Lymphedema, not elsewhere classified: Secondary | ICD-10-CM

## 2017-04-06 DIAGNOSIS — M79605 Pain in left leg: Secondary | ICD-10-CM

## 2017-04-06 NOTE — Therapy (Signed)
[place] [address] [place], [place], [address] Phone: [phone]   Fax:  [phone]  Physical Therapy Treatment  Patient Details  Name: Megan [name] MRN: [id number] Date of Birth: 1945/05/20 Referring Provider: Madie Reno   Encounter Date: 04/06/2017  PT End of Session - 04/06/17 1542    Visit Number  10    Number of Visits  18    Date for PT Re-Evaluation  04/13/17    Authorization Type  Medicare    Authorization - Visit Number  10    Authorization - Number of Visits  18    PT Start Time  1696    PT Stop Time  1525    PT Time Calculation (min)  100 min    Equipment Utilized During Treatment  Gait belt    Activity Tolerance  Patient tolerated treatment well    Behavior During Therapy  [facility] for tasks assessed/performed       Past Medical History:  Diagnosis Date  . Acute diastolic heart failure ([place])   . Anxiety state, unspecified   . Congestive heart failure, unspecified   . Degenerative joint disease   . GERD (gastroesophageal reflux disease)   . Hyperlipidemia   . Hypertension   . Lymphedema   . Obesity   . Persistent atrial fibrillation (HCC)     post-termination pauses with afib s/p PPM  . Presence of permanent cardiac pacemaker   . Sinoatrial node dysfunction (HCC)    s/p PPM  . Sleep apnea    compliant with CPAP  . Unspecified hypothyroidism   . Unspecified venous (peripheral) insufficiency     Past Surgical History:  Procedure Laterality Date  . CATARACT EXTRACTION W/PHACO Left 09/09/2014   Procedure: CATARACT EXTRACTION PHACO AND INTRAOCULAR LENS PLACEMENT (IOC);  Surgeon: Tonny Branch, MD;  Location: [facility];  Service: Ophthalmology;  Laterality: Left;  CDE: 6.99  . CATARACT EXTRACTION W/PHACO Right 10/07/2014   Procedure: CATARACT EXTRACTION PHACO AND INTRAOCULAR LENS PLACEMENT RIGHT EYE;  Surgeon: Tonny Branch, MD;  Location: [facility];  Service: Ophthalmology;  Laterality: Right;  CDE:5.16  .  PACEMAKER INSERTION     SJM by JA    There were no vitals filed for this visit.  Subjective Assessment - 04/06/17 1538    Subjective  Pt states that she has been having pain in the posterior aspect of her knee as well as right below her little toe.      Pertinent History  lymphedema, HTN, AFib CHF  obesity,     Limitations  Standing;Walking    How long can you sit comfortably?  no  problem    How long can you stand comfortably?  no longer than a minute without support.    How long can you walk comfortably?  a couple of minutes with assistive device     Currently in Pain?  Yes    Pain Score  6     Pain Location  Leg    Pain Orientation  Left    Pain Onset  1 to 4 weeks ago            LYMPHEDEMA/ONCOLOGY QUESTIONNAIRE - 04/06/17 1420      Left Lower Extremity Lymphedema   20 cm Proximal to Suprapatella  77 cm    10 cm Proximal to Suprapatella  79.2 cm    At Midpatella/Popliteal Crease  64.5 cm    30 cm Proximal to Floor at Lateral Plantar Foot  60.3  cm    20 cm Proximal to Floor at Lateral Plantar Foot  45 cm    10 cm Proximal to Floor at Lateral Malleoli  36 cm    Circumference of ankle/heel  33.4 cm.    5 cm Proximal to 1st MTP Joint  23.8 cm    Across MTP Joint  24.2 cm    Around Proximal Great Toe  9.2 cm      Wound on lateral aspect of ankle dressed with medihoney and 2x2 prior to compression bandaging  Alginate placed in posterior aspect of left knee to control yeast prior to compression bandaging          [facility] Adult PT Treatment/Exercise - 04/06/17 0001      Manual Therapy   Manual Therapy  Manual Lymphatic Drainage (MLD);Compression Bandaging    Manual therapy comments  completed seperately from all other aspects of treatment    Manual Lymphatic Drainage (MLD)  to include supraclavicular; deep and superfical abdominal followed by routing fluid using Lt inguinal axillary anastomosis and Leg; posterior aspect done sidelying     Compression Bandaging   Short stretch bandaging  multiple layer with foam  for total LE                PT Short Term Goals - 03/16/17 1645      PT SHORT TERM GOAL #1   Title  Pt Lt LE volumes to be decreased by 3 cm to reduce risk of wounds    Time  4    Period  Weeks    Status  On-going      PT SHORT TERM GOAL #2   Title  PT Lt LE to no longer have heat or redness for evidence of decreased cellulitis.     Time  4    Period  Weeks    Status  On-going      PT SHORT TERM GOAL #3   Title  PT to state that she has called [employer] to acquire new juxtafit garment.     Time  4    Period  Weeks    Status  On-going      PT SHORT TERM GOAL #4   Title  PT to be ambulating with a rolling walker in order to reduce risk of falling     Time  2    Period  Weeks    Status  On-going        PT Long Term Goals - 03/16/17 1645      PT LONG TERM GOAL #1   Title  PT LT LE volume to be decreased by 6 cm to decrease pain to no greater than a 2/10 to allow pt to stand for 5-10 minutes without having to sit down to make a small meal.     Time  8    Period  Weeks    Status  On-going      PT LONG TERM GOAL #2   Title  Pt to be walking with a rolling walker for a 30 minutes at a time to improve lymph circulation.     Time  8    Period  Weeks    Status  On-going      PT LONG TERM GOAL #3   Title  Pt to have acquired  new juxtafit to ensure proper compression to prevent future exacerbation of lymphedema.     Time  8    Period  Weeks  Status  On-going            Plan - 04/06/17 1542    Clinical Impression Statement  Measurements taken; except for the foot all volumes have increased this treatment episode.  Therapist assessed posterior aspect of Lt knee for patient.  This area appears to have yeast therefore therapist placed alginate to area prior to wrapping.  Area by little toe has what appears to be a blood blister.  This area had extra cotton placed to prevent rubbing.  Pt was urged to take  bandages off if they become uncomfortable.  Pt wound assessed size has not increased but inside wound appears to be three small tracts that penetrate rather deep but therapist was unable to assess how deep.  Therapist cut foam for thigh .  Pt has a large bowling ball size of lymph fluid along the distalaspect of her leg just superior to the knee which the therpist took extra manual time in this area to attempt to get fluid moving.     Rehab Potential  Good    PT Frequency  3x / week    PT Duration  6 weeks    PT Treatment/Interventions  ADLs/Self Care Home Management;Patient/family education;Manual lymph drainage;Compression bandaging    PT Next Visit Plan  Assess posterior aspect of knee to see how alginate did to decrease possible yeast, assess wound at lateral ankle and comfort of full leg compression bandages.      PT Home Exercise Plan  lymphedema exercises     Consulted and Agree with Plan of Care  Patient       Patient will benefit from skilled therapeutic intervention in order to improve the following deficits and impairments:  Pain, Decreased strength, Obesity, Difficulty walking, Decreased activity tolerance, Increased edema  Visit Diagnosis: Lymphedema, not elsewhere classified  Pain in left leg  Difficulty in walking, not elsewhere classified     Problem List Patient Active Problem List   Diagnosis Date Noted  . OAB (overactive bladder) 02/22/2017  . Lymphedema 01/26/2017  . Gastroesophageal reflux disease without esophagitis 01/26/2017  . Acquired hypothyroidism 01/26/2017  . Mixed hyperlipidemia 01/26/2017  . Pyrexia   . Sleeps in sitting position due to orthopnea 07/31/2015  . Acute on chronic systolic congestive heart failure ([place]) 06/09/2015  . Morbid obesity due to excess calories ([place]) 06/09/2015  . PVD (peripheral vascular disease) with claudication ([place]) 06/09/2015  . OSA (obstructive sleep apnea) 06/09/2015  . Long term (current) use of anticoagulants  06/19/2010  . Tachycardia-bradycardia syndrome ([place]) 11/18/2008  . PPM-St.Jude 11/18/2008  . Hyperlipidemia associated with type 2 diabetes mellitus ([place]) 02/21/2008  . HYPERTENSION, BENIGN 02/21/2008  . ATRIAL FIBRILLATION 02/21/2008    Rayetta Humphrey, PT CLT [phone] 04/06/2017, 3:50 PM  [place] [address] [place], [place], [address] Phone: [phone]   Fax:  [phone]  Name: Megan [name] MRN: [id number] Date of Birth: [birth year]

## 2017-04-07 ENCOUNTER — Encounter: Payer: Self-pay | Admitting: Family Medicine

## 2017-04-07 ENCOUNTER — Ambulatory Visit (INDEPENDENT_AMBULATORY_CARE_PROVIDER_SITE_OTHER): Payer: Medicare Other | Admitting: Family Medicine

## 2017-04-07 VITALS — BP 139/78 | HR 75 | Temp 96.9°F | Ht 60.0 in

## 2017-04-07 DIAGNOSIS — L03116 Cellulitis of left lower limb: Secondary | ICD-10-CM | POA: Diagnosis not present

## 2017-04-07 MED ORDER — CEPHALEXIN 500 MG PO CAPS: 500.0000 mg | ORAL_CAPSULE | Freq: Four times a day (QID) | ORAL | 0 refills | Status: DC

## 2017-04-07 NOTE — Patient Instructions (Signed)

## 2017-04-07 NOTE — Progress Notes (Signed)
Subjective: CC: LLE skin infection PCP: Terald Sleeper, PA-C [id number] Megan [name] is a 72 y.o. female presenting to clinic today for:  1. LE skin infection Patient reports that she was told by her lymphedema specialist that there was concern for cellulitis in the left lower extremity.  She notes an associated wound.  She was just treated with Keflex in December for concern for cellulitis.  She reports that she was told that she has a wound on her left lower extremity that is not healing well.  She notes that this was debrided recently.  She denies fevers, chills.  She is unsure if there is any pus coming from wound but was told that there is several blisters near the wound.  She does not want wound undressed as this was just applied yesterday and is not due for removal until tomorrow.   ROS: Per HPI  No Known Allergies Past Medical History:  Diagnosis Date  . Acute diastolic heart failure ([place])   . Anxiety state, unspecified   . Congestive heart failure, unspecified   . Degenerative joint disease   . GERD (gastroesophageal reflux disease)   . Hyperlipidemia   . Hypertension   . Lymphedema   . Obesity   . Persistent atrial fibrillation (HCC)     post-termination pauses with afib s/p PPM  . Presence of permanent cardiac pacemaker   . Sinoatrial node dysfunction (HCC)    s/p PPM  . Sleep apnea    compliant with CPAP  . Unspecified hypothyroidism   . Unspecified venous (peripheral) insufficiency     Current Outpatient Medications:  .  acetaminophen (TYLENOL) 500 MG tablet, Take 500 mg by mouth every 6 (six) hours as needed for mild pain or moderate pain., Disp: , Rfl:  .  ALPRAZolam (XANAX) 0.25 MG tablet, Take 0.25 mg by mouth at bedtime as needed for anxiety or sleep. , Disp: , Rfl:  .  atorvastatin (LIPITOR) 80 MG tablet, Take 1 tablet (80 mg total) by mouth daily., Disp: 90 tablet, Rfl: 3 .  clotrimazole-betamethasone (LOTRISONE) cream, Apply 1 application topically daily as  needed (for affected area(S))., Disp: , Rfl:  .  diltiazem (CARDIZEM CD) 240 MG 24 hr capsule, Take 1 capsule (240 mg total) by mouth daily., Disp: 90 capsule, Rfl: 3 .  esomeprazole (NEXIUM) 40 MG capsule, Take 40 mg by mouth daily as needed (acid reflux). , Disp: , Rfl:  .  hydrochlorothiazide (HYDRODIURIL) 25 MG tablet, Take 1 tablet (25 mg total) by mouth daily., Disp: 30 tablet, Rfl: 0 .  levothyroxine (SYNTHROID, LEVOTHROID) 150 MCG tablet, Take 1 tablet (150 mcg total) by mouth every morning., Disp: 90 tablet, Rfl: 3 .  metoprolol tartrate (LOPRESSOR) 100 MG tablet, TAKE ONE-HALF TABLET BY MOUTH TWICE DAILY, Disp: 90 tablet, Rfl: 3 .  nystatin (MYCOSTATIN/NYSTOP) powder, Apply up to 4 times daily to affected area under right breast x 10 days, Disp: 30 g, Rfl: 1 .  silver sulfADIAZINE (SILVADENE) 1 % cream, Apply 1 application topically daily., Disp: 50 g, Rfl: 0 .  Vitamin D, Ergocalciferol, (DRISDOL) 50000 units CAPS capsule, Take 1 capsule (50,000 Units total) by mouth every 7 (seven) days. Takes on Thursdays., Disp: 12 capsule, Rfl: 3 .  warfarin (COUMADIN) 3 MG tablet, Take 1 tablet daily except 1 1/2 tablets on Sundays, Tuesdays and Thursdays, Disp: 45 tablet, Rfl: 4 Social History   Socioeconomic History  . Marital status: Widowed    Spouse name: Not on file  .  Number of children: Not on file  . Years of education: Not on file  . Highest education level: Not on file  Social Needs  . Financial resource strain: Not on file  . Food insecurity - worry: Not on file  . Food insecurity - inability: Not on file  . Transportation needs - medical: Not on file  . Transportation needs - non-medical: Not on file  Occupational History  . Occupation: RETIRED    Employer: RETIRED  Tobacco Use  . Smoking status: Former Smoker    Packs/day: 0.50    Years: 10.00    Pack years: 5.00    Types: Cigarettes    Last attempt to quit: 03/29/1978    Years since quitting: 39.0  . Smokeless tobacco:  Never Used  Substance and Sexual Activity  . Alcohol use: No    Alcohol/week: 0.0 oz  . Drug use: No  . Sexual activity: No    Birth control/protection: None  Other Topics Concern  . Not on file  Social History Narrative  . Not on file   Family History  Problem Relation Age of Onset  . Parkinson's disease Unknown   . CVA Unknown   . Parkinson's disease Father     Objective: Office vital signs reviewed. BP 139/78   Pulse 75   Temp (!) 96.9 F (36.1 C) (Oral)   Ht 5' (1.524 m)   BMI 58.59 kg/m   Physical Examination:  General: Awake, alert, morbidly obese, No acute distress HEENT: MMM, sclera white Neuro: AOx3, arrives in wheelchair  Assessment/ Plan: 72 y.o. female   1. Cellulitis of left lower extremity Patient has known lymphedema.  She is at increased risk for developing lower extremities infection.  The 04/06/2017 PT note does address a cellulitis within the short-term goal #2.  Physical exam findings outlining the cellulitis were not well-defined within either of the most recent notes.  Wound in LLE is noted.  I did ask the patient to see if her physical therapist will consider obtaining photos of concerns with each visit since we are unable to assess them because she refuses to unwrap lower extremity during office visits.  Empiric treatment with keflex.  Return precautions reviewed.  Patient to follow up with PCP in 2 weeks for reevaluation.   Meds ordered this encounter  Medications  . cephALEXin (KEFLEX) 500 MG capsule    Sig: Take 1 capsule (500 mg total) by mouth 4 (four) times daily.    Dispense:  40 capsule    Refill:  [place], DO [place] [phone]

## 2017-04-08 ENCOUNTER — Encounter (HOSPITAL_COMMUNITY): Payer: Medicare Other | Admitting: Physical Therapy

## 2017-04-08 ENCOUNTER — Other Ambulatory Visit: Payer: Self-pay | Admitting: Internal Medicine

## 2017-04-08 ENCOUNTER — Ambulatory Visit (HOSPITAL_COMMUNITY): Payer: Medicare Other | Admitting: Physical Therapy

## 2017-04-08 DIAGNOSIS — I890 Lymphedema, not elsewhere classified: Secondary | ICD-10-CM | POA: Diagnosis not present

## 2017-04-08 DIAGNOSIS — R262 Difficulty in walking, not elsewhere classified: Secondary | ICD-10-CM | POA: Diagnosis not present

## 2017-04-08 DIAGNOSIS — M79605 Pain in left leg: Secondary | ICD-10-CM | POA: Diagnosis not present

## 2017-04-08 NOTE — Therapy (Signed)
[place], [place], [address] Phone: [phone]   Fax:  [phone]  Physical Therapy Treatment  Patient Details  Name: Megan [name] MRN: [id number] Date of Birth: [birth year] Referring Provider: Madie Reno   Encounter Date: 04/08/2017  PT End of Session - 04/08/17 1533    Visit Number  11    Number of Visits  18    Date for PT Re-Evaluation  04/13/17    Authorization Type  Medicare    Authorization - Visit Number  11    Authorization - Number of Visits  18    PT Start Time  7564    PT Stop Time  1200    PT Time Calculation (min)  80 min    Equipment Utilized During Treatment  Gait belt    Activity Tolerance  Patient tolerated treatment well    Behavior During Therapy  WFL for tasks assessed/performed       Past Medical History:  Diagnosis Date  . Acute diastolic heart failure ([place])   . Anxiety state, unspecified   . Congestive heart failure, unspecified   . Degenerative joint disease   . GERD (gastroesophageal reflux disease)   . Hyperlipidemia   . Hypertension   . Lymphedema   . Obesity   . Persistent atrial fibrillation (H[facility])     post-termination pauses with afib s/p PPM  . Presence of permanent cardiac pacemaker   . Sinoatrial node dysfunction (H[facility])    s/p PPM  . Sleep apnea    compliant with CPAP  . Unspecified hypothyroidism   . Unspecified venous (peripheral) insufficiency     Past Surgical History:  Procedure Laterality Date  . CATARACT EXTRACTION W/PHACO Left 09/09/2014   Procedure: CATARACT EXTRACTION PHACO AND INTRAOCULAR LENS PLACEMENT (IOC);  Surgeon: Tonny Branch, MD;  Location: [facility];  Service: Ophthalmology;  Laterality: Left;  CDE: 6.99  . CATARACT EXTRACTION W/PHACO Right 10/07/2014   Procedure: CATARACT EXTRACTION PHACO AND INTRAOCULAR LENS PLACEMENT RIGHT EYE;  Surgeon: Tonny Branch, MD;  Location: [facility];  Service: Ophthalmology;  Laterality: Right;  CDE:5.16  .  PACEMAKER INSERTION     SJM by JA    There were no vitals filed for this visit.    Woundcare to Lt lateral ankle: Cleansed and debrided with forceps to remove slough and devitalized tissue from perimeter.  50% granulated following debridment.  Dressed with medihoney gel and gauze.               [facility] Adult PT Treatment/Exercise - 04/08/17 0001      Manual Therapy   Manual Therapy  Manual Lymphatic Drainage (MLD);Compression Bandaging    Manual therapy comments  completed seperately from all other aspects of treatment    Manual Lymphatic Drainage (MLD)  to include supraclavicular; deep and superfical abdominal followed by routing fluid using Lt inguinal axillary anastomosis and Leg; posterior aspect done sidelying     Compression Bandaging  Short stretch bandaging  multiple layer with foam  for total LE                PT Short Term Goals - 03/16/17 1645      PT SHORT TERM GOAL #1   Title  Pt Lt LE volumes to be decreased by 3 cm to reduce risk of wounds    Time  4    Period  Weeks    Status  On-going      PT SHORT TERM GOAL #  2   Title  PT Lt LE to no longer have heat or redness for evidence of decreased cellulitis.     Time  4    Period  Weeks    Status  On-going      PT SHORT TERM GOAL #3   Title  PT to state that she has called [employer] to acquire new juxtafit garment.     Time  4    Period  Weeks    Status  On-going      PT SHORT TERM GOAL #4   Title  PT to be ambulating with a rolling walker in order to reduce risk of falling     Time  2    Period  Weeks    Status  On-going        PT Long Term Goals - 03/16/17 1645      PT LONG TERM GOAL #1   Title  PT LT LE volume to be decreased by 6 cm to decrease pain to no greater than a 2/10 to allow pt to stand for 5-10 minutes without having to sit down to make a small meal.     Time  8    Period  Weeks    Status  On-going      PT LONG TERM GOAL #2   Title  Pt to be walking with a rolling  walker for a 30 minutes at a time to improve lymph circulation.     Time  8    Period  Weeks    Status  On-going      PT LONG TERM GOAL #3   Title  Pt to have acquired  new juxtafit to ensure proper compression to prevent future exacerbation of lymphedema.     Time  8    Period  Weeks    Status  On-going            Plan - 04/08/17 1540    Clinical Impression Statement  Pt returned to MD yesterday and was placed on Keflex for her cellulitis.  Pt with noted reduction in redness upon removal of bandages.  Cleansed LE well and applied lotrimen cream (pt brought from home) to posterior fold of LE at knee where yeast accumulated.   Debrided slough from wound and dry skin from edges to promote approximation.  Manual completed for Lt LE, cleansed, moisturized and rebandaged using short stretch bandages.  PT reported overall comfort following.  to remeasure wound and volume on Wednesday.    Rehab Potential  Good    PT Frequency  3x / week    PT Duration  6 weeks    PT Treatment/Interventions  ADLs/Self Care Home Management;Patient/family education;Manual lymph drainage;Compression bandaging    PT Next Visit Plan  continue with complete lymphedema therapy.  Continue to treat area behind knee and wound on lateral ankle.  Re-measure wound and volumes on Wednesday.    PT Home Exercise Plan  lymphedema exercises     Consulted and Agree with Plan of Care  Patient       Patient will benefit from skilled therapeutic intervention in order to improve the following deficits and impairments:  Pain, Decreased strength, Obesity, Difficulty walking, Decreased activity tolerance, Increased edema  Visit Diagnosis: Lymphedema, not elsewhere classified  Pain in left leg  Difficulty in walking, not elsewhere classified     Problem List Patient Active Problem List   Diagnosis Date Noted  . OAB (overactive bladder) 02/22/2017  .  Lymphedema 01/26/2017  . Gastroesophageal reflux disease without  esophagitis 01/26/2017  . Acquired hypothyroidism 01/26/2017  . Mixed hyperlipidemia 01/26/2017  . Pyrexia   . Sleeps in sitting position due to orthopnea 07/31/2015  . Acute on chronic systolic congestive heart failure ([place]) 06/09/2015  . Morbid obesity due to excess calories ([place]) 06/09/2015  . PVD (peripheral vascular disease) with claudication ([place]) 06/09/2015  . OSA (obstructive sleep apnea) 06/09/2015  . Long term (current) use of anticoagulants 06/19/2010  . Tachycardia-bradycardia syndrome ([place]) 11/18/2008  . PPM-St.Jude 11/18/2008  . Hyperlipidemia associated with type 2 diabetes mellitus ([place]) 02/21/2008  . HYPERTENSION, BENIGN 02/21/2008  . ATRIAL FIBRILLATION 02/21/2008   Teena Irani, PTA/CLT [phone]  Teena Irani 04/08/2017, 3:52 PM  [place] [address] [place], [place], [address] Phone: [phone]   Fax:  [phone]  Name: Megan [name] MRN: [id number] Date of Birth: June 19, 1945

## 2017-04-11 ENCOUNTER — Encounter (HOSPITAL_COMMUNITY): Payer: Medicare Other | Admitting: Physical Therapy

## 2017-04-11 ENCOUNTER — Ambulatory Visit (HOSPITAL_COMMUNITY): Payer: Medicare Other | Admitting: Physical Therapy

## 2017-04-11 DIAGNOSIS — M79605 Pain in left leg: Secondary | ICD-10-CM | POA: Diagnosis not present

## 2017-04-11 DIAGNOSIS — I890 Lymphedema, not elsewhere classified: Secondary | ICD-10-CM | POA: Diagnosis not present

## 2017-04-11 DIAGNOSIS — R262 Difficulty in walking, not elsewhere classified: Secondary | ICD-10-CM | POA: Diagnosis not present

## 2017-04-11 NOTE — Therapy (Signed)
[place] [address] [place], [place], [address] Phone: [phone]   Fax:  [phone]  Physical Therapy Treatment  Patient Details  Name: Megan [name] MRN: [id number] Date of Birth: 06-29-1945 Referring Provider: Madie Reno   Encounter Date: 04/11/2017  PT End of Session - 04/11/17 1039    Visit Number  12    Number of Visits  18    Date for PT Re-Evaluation  04/13/17    Authorization Type  Medicare    Authorization - Visit Number  12    Authorization - Number of Visits  18    PT Start Time  0945    PT Stop Time  1030    PT Time Calculation (min)  45 min    Equipment Utilized During Treatment  Gait belt    Activity Tolerance  Patient tolerated treatment well    Behavior During Therapy  [facility] for tasks assessed/performed       Past Medical History:  Diagnosis Date  . Acute diastolic heart failure ([place])   . Anxiety state, unspecified   . Congestive heart failure, unspecified   . Degenerative joint disease   . GERD (gastroesophageal reflux disease)   . Hyperlipidemia   . Hypertension   . Lymphedema   . Obesity   . Persistent atrial fibrillation (HCC)     post-termination pauses with afib s/p PPM  . Presence of permanent cardiac pacemaker   . Sinoatrial node dysfunction (H[facility])    s/p PPM  . Sleep apnea    compliant with CPAP  . Unspecified hypothyroidism   . Unspecified venous (peripheral) insufficiency     Past Surgical History:  Procedure Laterality Date  . CATARACT EXTRACTION W/PHACO Left 09/09/2014   Procedure: CATARACT EXTRACTION PHACO AND INTRAOCULAR LENS PLACEMENT (IOC);  Surgeon: Tonny Branch, MD;  Location: [facility];  Service: Ophthalmology;  Laterality: Left;  CDE: 6.99  . CATARACT EXTRACTION W/PHACO Right 10/07/2014   Procedure: CATARACT EXTRACTION PHACO AND INTRAOCULAR LENS PLACEMENT RIGHT EYE;  Surgeon: Tonny Branch, MD;  Location: [facility];  Service: Ophthalmology;  Laterality: Right;  CDE:5.16  .  PACEMAKER INSERTION     SJM by JA    There were no vitals filed for this visit.         LYMPHEDEMA/ONCOLOGY QUESTIONNAIRE - 04/11/17 1037      Left Lower Extremity Lymphedema   Other  wound measurements 0.8X1.5X 0.3 cm on 04/04/17             Wound Therapy - 04/11/17 1037    Subjective  Pt states she got her appt times mixed up, came 45 minutes late.  No pain or issues.    Pain Score  0-No pain    Dressing   medihoney gauze packed into wound with 2X2 medipore over       [facility] Adult PT Treatment/Exercise - 04/11/17 0001      Manual Therapy   Manual Therapy  Compression Bandaging    Manual therapy comments  completed seperately from all other aspects of treatment    Manual Lymphatic Drainage (MLD)  unable to complete today due to time    Compression Bandaging  Short stretch bandaging  multiple layer with foam  for total LE                PT Short Term Goals - 03/16/17 1645      PT SHORT TERM GOAL #1   Title  Pt Lt LE volumes to be  decreased by 3 cm to reduce risk of wounds    Time  4    Period  Weeks    Status  On-going      PT SHORT TERM GOAL #2   Title  PT Lt LE to no longer have heat or redness for evidence of decreased cellulitis.     Time  4    Period  Weeks    Status  On-going      PT SHORT TERM GOAL #3   Title  PT to state that she has called [employer] to acquire new juxtafit garment.     Time  4    Period  Weeks    Status  On-going      PT SHORT TERM GOAL #4   Title  PT to be ambulating with a rolling walker in order to reduce risk of falling     Time  2    Period  Weeks    Status  On-going        PT Long Term Goals - 03/16/17 1645      PT LONG TERM GOAL #1   Title  PT LT LE volume to be decreased by 6 cm to decrease pain to no greater than a 2/10 to allow pt to stand for 5-10 minutes without having to sit down to make a small meal.     Time  8    Period  Weeks    Status  On-going      PT LONG TERM GOAL #2   Title  Pt to  be walking with a rolling walker for a 30 minutes at a time to improve lymph circulation.     Time  8    Period  Weeks    Status  On-going      PT LONG TERM GOAL #3   Title  Pt to have acquired  new juxtafit to ensure proper compression to prevent future exacerbation of lymphedema.     Time  8    Period  Weeks    Status  On-going            Plan - 04/11/17 1039    Clinical Impression Statement  Pt late for appt, unable to complete manual lymph drainage, just bandaging and woundcare only.  Wound continues to decrease in size, however with noted increased depth.  Also indduration and redness is disapating.  Yeast behind knee is nearly gone as well.  Cleansed LE, moisturzied and reapplied lotrimen to crease posterior LE.  continued with short stretch and 1/2" foam.     Rehab Potential  Good    PT Frequency  3x / week    PT Duration  6 weeks    PT Treatment/Interventions  ADLs/Self Care Home Management;Patient/family education;Manual lymph drainage;Compression bandaging    PT Next Visit Plan  continue with complete lymphedema therapy.  Continue to treat area behind knee and wound on lateral ankle.  Re-measure wound and volumes on Wednesday.    PT Home Exercise Plan  lymphedema exercises     Consulted and Agree with Plan of Care  Patient       Patient will benefit from skilled therapeutic intervention in order to improve the following deficits and impairments:  Pain, Decreased strength, Obesity, Difficulty walking, Decreased activity tolerance, Increased edema  Visit Diagnosis: Lymphedema, not elsewhere classified  Pain in left leg     Problem List Patient Active Problem List   Diagnosis Date Noted  . OAB (overactive  bladder) 02/22/2017  . Lymphedema 01/26/2017  . Gastroesophageal reflux disease without esophagitis 01/26/2017  . Acquired hypothyroidism 01/26/2017  . Mixed hyperlipidemia 01/26/2017  . Pyrexia   . Sleeps in sitting position due to orthopnea 07/31/2015  .  Acute on chronic systolic congestive heart failure ([place]) 06/09/2015  . Morbid obesity due to excess calories ([place]) 06/09/2015  . PVD (peripheral vascular disease) with claudication ([place]) 06/09/2015  . OSA (obstructive sleep apnea) 06/09/2015  . Long term (current) use of anticoagulants 06/19/2010  . Tachycardia-bradycardia syndrome ([place]) 11/18/2008  . PPM-St.Jude 11/18/2008  . Hyperlipidemia associated with type 2 diabetes mellitus ([place]) 02/21/2008  . HYPERTENSION, BENIGN 02/21/2008  . ATRIAL FIBRILLATION 02/21/2008   Teena Irani, PTA/CLT [phone]  Teena Irani 04/11/2017, 10:42 AM  [place], [place], [address] Phone: [phone]   Fax:  [phone]  Name: Megan [name] MRN: [id number] Date of Birth: January 11, 1946

## 2017-04-13 ENCOUNTER — Encounter (HOSPITAL_COMMUNITY): Payer: Medicare Other | Admitting: Physical Therapy

## 2017-04-13 ENCOUNTER — Ambulatory Visit (HOSPITAL_COMMUNITY): Payer: Medicare Other | Admitting: Physical Therapy

## 2017-04-13 DIAGNOSIS — I890 Lymphedema, not elsewhere classified: Secondary | ICD-10-CM | POA: Diagnosis not present

## 2017-04-13 DIAGNOSIS — R262 Difficulty in walking, not elsewhere classified: Secondary | ICD-10-CM | POA: Diagnosis not present

## 2017-04-13 DIAGNOSIS — M79605 Pain in left leg: Secondary | ICD-10-CM | POA: Diagnosis not present

## 2017-04-13 NOTE — Therapy (Signed)
[facility] [place], [place], [address] Phone: [phone]   Fax:  [phone]  Physical Therapy Treatment  Patient Details  Name: Megan [name] MRN: [id number] Date of Birth: 1945/08/04 Referring Provider: Madie Reno   Encounter Date: 04/13/2017  PT End of Session - 04/13/17 1032    Visit Number  13    Number of Visits  18    Date for PT Re-Evaluation  04/13/17    Authorization Type  Medicare    Authorization - Visit Number  13    Authorization - Number of Visits  18    PT Start Time  0900    PT Stop Time  1025    PT Time Calculation (min)  85 min    Equipment Utilized During Treatment  Gait belt    Activity Tolerance  Patient tolerated treatment well    Behavior During Therapy  WFL for tasks assessed/performed       Past Medical History:  Diagnosis Date  . Acute diastolic heart failure ([place])   . Anxiety state, unspecified   . Congestive heart failure, unspecified   . Degenerative joint disease   . GERD (gastroesophageal reflux disease)   . Hyperlipidemia   . Hypertension   . Lymphedema   . Obesity   . Persistent atrial fibrillation (HCC)     post-termination pauses with afib s/p PPM  . Presence of permanent cardiac pacemaker   . Sinoatrial node dysfunction (HCC)    s/p PPM  . Sleep apnea    compliant with CPAP  . Unspecified hypothyroidism   . Unspecified venous (peripheral) insufficiency     Past Surgical History:  Procedure Laterality Date  . CATARACT EXTRACTION W/PHACO Left 09/09/2014   Procedure: CATARACT EXTRACTION PHACO AND INTRAOCULAR LENS PLACEMENT (IOC);  Surgeon: Tonny Branch, MD;  Location: [facility];  Service: Ophthalmology;  Laterality: Left;  CDE: 6.99  . CATARACT EXTRACTION W/PHACO Right 10/07/2014   Procedure: CATARACT EXTRACTION PHACO AND INTRAOCULAR LENS PLACEMENT RIGHT EYE;  Surgeon: Tonny Branch, MD;  Location: [facility];  Service: Ophthalmology;  Laterality: Right;  CDE:5.16  .  PACEMAKER INSERTION     SJM by JA    There were no vitals filed for this visit.  Subjective Assessment - 04/13/17 1036    Subjective  Pt states she is not having any pain.  Reports she continues to void alot.     Currently in Pain?  No/denies            LYMPHEDEMA/ONCOLOGY QUESTIONNAIRE - 04/13/17 1030      Left Lower Extremity Lymphedema   20 cm Proximal to Suprapatella  75 cm    10 cm Proximal to Suprapatella  71.5 cm    At Midpatella/Popliteal Crease  62.5 cm    30 cm Proximal to Floor at Lateral Plantar Foot  49.5 cm    20 cm Proximal to Floor at Lateral Plantar Foot  39 cm    10 cm Proximal to Floor at Lateral Malleoli  28.6 cm    Circumference of ankle/heel  32 cm.    5 cm Proximal to 1st MTP Joint  22.2 cm    Across MTP Joint  24.2 cm    Around Proximal Great Toe  9.2 cm    Other  wound measurements 0.8X1X0.8 (was 0.8X1.5X 0.3 cm on 04/04/17)               [facility] Adult PT Treatment/Exercise - 04/13/17 0001  Manual Therapy   Manual Therapy  Compression Bandaging    Manual therapy comments  completed seperately from all other aspects of treatment    Manual Lymphatic Drainage (MLD)  For Lt LE, including short neck, inguinal axillary anastomosis    Compression Bandaging  Short stretch bandaging  multiple layer with foam  for total LE              PT Education - 04/13/17 1035    Education provided  Yes    Education Details  suggested a nutrition consult to help manage her weight as this wound in turn help her lymphedema and improve her overall mobility.     Person(s) Educated  Patient    Methods  Explanation    Comprehension  Verbalized understanding       PT Short Term Goals - 03/16/17 1645      PT SHORT TERM GOAL #1   Title  Pt Lt LE volumes to be decreased by 3 cm to reduce risk of wounds    Time  4    Period  Weeks    Status  On-going      PT SHORT TERM GOAL #2   Title  PT Lt LE to no longer have heat or redness for evidence of  decreased cellulitis.     Time  4    Period  Weeks    Status  On-going      PT SHORT TERM GOAL #3   Title  PT to state that she has called [employer] to acquire new juxtafit garment.     Time  4    Period  Weeks    Status  On-going      PT SHORT TERM GOAL #4   Title  PT to be ambulating with a rolling walker in order to reduce risk of falling     Time  2    Period  Weeks    Status  On-going        PT Long Term Goals - 03/16/17 1645      PT LONG TERM GOAL #1   Title  PT LT LE volume to be decreased by 6 cm to decrease pain to no greater than a 2/10 to allow pt to stand for 5-10 minutes without having to sit down to make a small meal.     Time  8    Period  Weeks    Status  On-going      PT LONG TERM GOAL #2   Title  Pt to be walking with a rolling walker for a 30 minutes at a time to improve lymph circulation.     Time  8    Period  Weeks    Status  On-going      PT LONG TERM GOAL #3   Title  Pt to have acquired  new juxtafit to ensure proper compression to prevent future exacerbation of lymphedema.     Time  8    Period  Weeks    Status  On-going            Plan - 04/13/17 1032    Clinical Impression Statement  Lt LE measured for volume and wound measured this session.  Wound is overall improving, however increased depth and drainage.  Perimeter is much improved with less edema and induration as previously.  continued to pack with medihoney gauze.  volume of Lt LE much improved in both proximal and distal aspects.  Induration is  also reduced.  Educated on importance of nutrition and suggested a nutrition consult to help manage her weight as this wound in turn help her lymphedema and improve her overall mobility.  Pt verbalized understanding.      Rehab Potential  Good    PT Frequency  3x / week    PT Duration  6 weeks    PT Treatment/Interventions  ADLs/Self Care Home Management;Patient/family education;Manual lymph drainage;Compression bandaging    PT Next  Visit Plan  continue with complete lymphedema therapy.  Continue to treat area behind knee and wound on lateral ankle.  Re-measure wound and volumes on Wednesday.    PT Home Exercise Plan  lymphedema exercises     Consulted and Agree with Plan of Care  Patient       Patient will benefit from skilled therapeutic intervention in order to improve the following deficits and impairments:  Pain, Decreased strength, Obesity, Difficulty walking, Decreased activity tolerance, Increased edema  Visit Diagnosis: Lymphedema, not elsewhere classified     Problem List Patient Active Problem List   Diagnosis Date Noted  . OAB (overactive bladder) 02/22/2017  . Lymphedema 01/26/2017  . Gastroesophageal reflux disease without esophagitis 01/26/2017  . Acquired hypothyroidism 01/26/2017  . Mixed hyperlipidemia 01/26/2017  . Pyrexia   . Sleeps in sitting position due to orthopnea 07/31/2015  . Acute on chronic systolic congestive heart failure ([place]) 06/09/2015  . Morbid obesity due to excess calories ([place]) 06/09/2015  . PVD (peripheral vascular disease) with claudication ([place]) 06/09/2015  . OSA (obstructive sleep apnea) 06/09/2015  . Long term (current) use of anticoagulants 06/19/2010  . Tachycardia-bradycardia syndrome ([place]) 11/18/2008  . PPM-St.Jude 11/18/2008  . Hyperlipidemia associated with type 2 diabetes mellitus ([place]) 02/21/2008  . HYPERTENSION, BENIGN 02/21/2008  . ATRIAL FIBRILLATION 02/21/2008   Teena Irani, PTA/CLT [phone]  Teena Irani 04/13/2017, 10:36 AM  [place], [place], [address] Phone: [phone]   Fax:  [phone]  Name: LAYLI CAPSHAW MRN: [id number] Date of Birth: [birth year]

## 2017-04-14 ENCOUNTER — Ambulatory Visit (INDEPENDENT_AMBULATORY_CARE_PROVIDER_SITE_OTHER): Payer: Medicare Other | Admitting: *Deleted

## 2017-04-14 DIAGNOSIS — Z5181 Encounter for therapeutic drug level monitoring: Secondary | ICD-10-CM

## 2017-04-14 DIAGNOSIS — I4891 Unspecified atrial fibrillation: Secondary | ICD-10-CM

## 2017-04-14 LAB — POCT INR: INR: 3.6

## 2017-04-14 NOTE — Patient Instructions (Signed)
Hold coumadin tonight then resume 1 tablet daily except 1 1/2 tablets on Sundays, Tuesdays and Thursdays Recheck in 3 weeks 

## 2017-04-15 ENCOUNTER — Encounter (HOSPITAL_COMMUNITY): Payer: Self-pay | Admitting: Physical Therapy

## 2017-04-15 ENCOUNTER — Encounter (HOSPITAL_COMMUNITY): Payer: Medicare Other | Admitting: Physical Therapy

## 2017-04-15 ENCOUNTER — Ambulatory Visit (HOSPITAL_COMMUNITY): Payer: Medicare Other | Admitting: Physical Therapy

## 2017-04-15 DIAGNOSIS — R262 Difficulty in walking, not elsewhere classified: Secondary | ICD-10-CM | POA: Diagnosis not present

## 2017-04-15 DIAGNOSIS — I890 Lymphedema, not elsewhere classified: Secondary | ICD-10-CM | POA: Diagnosis not present

## 2017-04-15 DIAGNOSIS — M79605 Pain in left leg: Secondary | ICD-10-CM | POA: Diagnosis not present

## 2017-04-15 NOTE — Therapy (Signed)
[facility] [address] [place], [place], [address] Phone: [phone]   Fax:  [phone]  Physical Therapy Treatment  Patient Details  Name: Megan [name] MRN: [id number] Date of Birth: 12/18/1945 Referring Provider: Madie Reno   Encounter Date: 04/15/2017  PT End of Session - 04/15/17 1229    Visit Number  14    Number of Visits  18    Date for PT Re-Evaluation  04/13/17    Authorization Type  Medicare    Authorization - Visit Number  14    Authorization - Number of Visits  18    PT Start Time  0910    PT Stop Time  1033    PT Time Calculation (min)  83 min    Equipment Utilized During Treatment  Gait belt    Activity Tolerance  Patient tolerated treatment well    Behavior During Therapy  WFL for tasks assessed/performed       Past Medical History:  Diagnosis Date  . Acute diastolic heart failure ([place])   . Anxiety state, unspecified   . Congestive heart failure, unspecified   . Degenerative joint disease   . GERD (gastroesophageal reflux disease)   . Hyperlipidemia   . Hypertension   . Lymphedema   . Obesity   . Persistent atrial fibrillation (HCC)     post-termination pauses with afib s/p PPM  . Presence of permanent cardiac pacemaker   . Sinoatrial node dysfunction (HCC)    s/p PPM  . Sleep apnea    compliant with CPAP  . Unspecified hypothyroidism   . Unspecified venous (peripheral) insufficiency     Past Surgical History:  Procedure Laterality Date  . CATARACT EXTRACTION W/PHACO Left 09/09/2014   Procedure: CATARACT EXTRACTION PHACO AND INTRAOCULAR LENS PLACEMENT (IOC);  Surgeon: Tonny Branch, MD;  Location: [facility];  Service: Ophthalmology;  Laterality: Left;  CDE: 6.99  . CATARACT EXTRACTION W/PHACO Right 10/07/2014   Procedure: CATARACT EXTRACTION PHACO AND INTRAOCULAR LENS PLACEMENT RIGHT EYE;  Surgeon: Tonny Branch, MD;  Location: [facility];  Service: Ophthalmology;  Laterality: Right;  CDE:5.16  .  PACEMAKER INSERTION     SJM by JA    There were no vitals filed for this visit.  Subjective Assessment - 04/15/17 1228    Subjective  PT states that she can't believe that she is pain free.      Pertinent History  lymphedema, HTN, AFib CHF  obesity,     Limitations  Standing;Walking    How long can you sit comfortably?  no  problem    How long can you stand comfortably?  no longer than a minute without support.    How long can you walk comfortably?  a couple of minutes with assistive device     Currently in Pain?  No/denies    Pain Onset  1 to 4 weeks ago                      [facility] Adult PT Treatment/Exercise - 04/15/17 0001      Manual Therapy   Manual Therapy  Compression Bandaging    Manual therapy comments  completed seperately from all other aspects of treatment    Manual Lymphatic Drainage (MLD)  , including short neck, deep and superfical abdominal, inguinal axillary anastomosis and LE.  Posterior completed sidelying     Compression Bandaging  Short stretch bandaging  multiple layer with foam  for total LE  PT Short Term Goals - 03/16/17 1645      PT SHORT TERM GOAL #1   Title  Pt Lt LE volumes to be decreased by 3 cm to reduce risk of wounds    Time  4    Period  Weeks    Status  On-going      PT SHORT TERM GOAL #2   Title  PT Lt LE to no longer have heat or redness for evidence of decreased cellulitis.     Time  4    Period  Weeks    Status  On-going      PT SHORT TERM GOAL #3   Title  PT to state that she has called [employer] to acquire new juxtafit garment.     Time  4    Period  Weeks    Status  On-going      PT SHORT TERM GOAL #4   Title  PT to be ambulating with a rolling walker in order to reduce risk of falling     Time  2    Period  Weeks    Status  On-going        PT Long Term Goals - 03/16/17 1645      PT LONG TERM GOAL #1   Title  PT LT LE volume to be decreased by 6 cm to decrease pain to no  greater than a 2/10 to allow pt to stand for 5-10 minutes without having to sit down to make a small meal.     Time  8    Period  Weeks    Status  On-going      PT LONG TERM GOAL #2   Title  Pt to be walking with a rolling walker for a 30 minutes at a time to improve lymph circulation.     Time  8    Period  Weeks    Status  On-going      PT LONG TERM GOAL #3   Title  Pt to have acquired  new juxtafit to ensure proper compression to prevent future exacerbation of lymphedema.     Time  8    Period  Weeks    Status  On-going            Plan - 04/15/17 1230    Clinical Impression Statement  Lt ankle continues to have induration although the size of the induration has decreased.  Therapist spent 10 mintues at this area with decongestive techniques attemtpting to decrease induration with minimal success.  Extra time also spent allong posterior distal thigh at distal lymph ball which is decreasing in size.  Wound continues to be free of slough.  Continued to dress wound with medihoney and gauze; lotrimin cream placed on posterior aspect of Lt LE to control yeast prior to compression wrapping with foam.     Rehab Potential  Good    PT Frequency  3x / week    PT Duration  6 weeks    PT Treatment/Interventions  ADLs/Self Care Home Management;Patient/family education;Manual lymph drainage;Compression bandaging    PT Next Visit Plan  continue with complete lymphedema therapy.  Continue to treat area behind knee and wound on lateral ankle.  Re-measure wound and volumes on Wednesday.    PT Home Exercise Plan  lymphedema exercises     Consulted and Agree with Plan of Care  Patient       Patient will benefit from skilled therapeutic intervention in order  to improve the following deficits and impairments:  Pain, Decreased strength, Obesity, Difficulty walking, Decreased activity tolerance, Increased edema  Visit Diagnosis: Lymphedema, not elsewhere classified     Problem List Patient  Active Problem List   Diagnosis Date Noted  . OAB (overactive bladder) 02/22/2017  . Lymphedema 01/26/2017  . Gastroesophageal reflux disease without esophagitis 01/26/2017  . Acquired hypothyroidism 01/26/2017  . Mixed hyperlipidemia 01/26/2017  . Pyrexia   . Sleeps in sitting position due to orthopnea 07/31/2015  . Acute on chronic systolic congestive heart failure ([place]) 06/09/2015  . Morbid obesity due to excess calories ([place]) 06/09/2015  . PVD (peripheral vascular disease) with claudication ([place]) 06/09/2015  . OSA (obstructive sleep apnea) 06/09/2015  . Long term (current) use of anticoagulants 06/19/2010  . Tachycardia-bradycardia syndrome ([place]) 11/18/2008  . PPM-St.Jude 11/18/2008  . Hyperlipidemia associated with type 2 diabetes mellitus ([place]) 02/21/2008  . HYPERTENSION, BENIGN 02/21/2008  . ATRIAL FIBRILLATION 02/21/2008   Rayetta Humphrey, PT CLT [phone] 04/15/2017, 12:34 PM  [facility] [address] [place], [place], [address] Phone: [phone]   Fax:  [phone]  Name: Megan [name] MRN: [id number] Date of Birth: Nov 25, 1945

## 2017-04-18 ENCOUNTER — Encounter (HOSPITAL_COMMUNITY): Payer: Medicare Other | Admitting: Physical Therapy

## 2017-04-18 ENCOUNTER — Ambulatory Visit (HOSPITAL_COMMUNITY): Payer: Medicare Other | Admitting: Physical Therapy

## 2017-04-18 DIAGNOSIS — R262 Difficulty in walking, not elsewhere classified: Secondary | ICD-10-CM

## 2017-04-18 DIAGNOSIS — M79605 Pain in left leg: Secondary | ICD-10-CM | POA: Diagnosis not present

## 2017-04-18 DIAGNOSIS — I890 Lymphedema, not elsewhere classified: Secondary | ICD-10-CM

## 2017-04-18 NOTE — Therapy (Signed)
[facility] [address] [place], [place], [address] Phone: [phone]   Fax:  [phone]  Physical Therapy Treatment  Patient Details  Name: Megan [name] MRN: [id number] Date of Birth: 10/05/1945 Referring Provider: Madie Reno   Encounter Date: 04/18/2017  PT End of Session - 04/18/17 1641    Visit Number  15    Number of Visits  18    Date for PT Re-Evaluation  04/13/17    Authorization Type  Medicare    Authorization - Visit Number  15    Authorization - Number of Visits  18    PT Start Time  4174    PT Stop Time  1638    PT Time Calculation (min)  83 min    Equipment Utilized During Treatment  Gait belt    Activity Tolerance  Patient tolerated treatment well    Behavior During Therapy  WFL for tasks assessed/performed       Past Medical History:  Diagnosis Date  . Acute diastolic heart failure ([place])   . Anxiety state, unspecified   . Congestive heart failure, unspecified   . Degenerative joint disease   . GERD (gastroesophageal reflux disease)   . Hyperlipidemia   . Hypertension   . Lymphedema   . Obesity   . Persistent atrial fibrillation (H[facility])     post-termination pauses with afib s/p PPM  . Presence of permanent cardiac pacemaker   . Sinoatrial node dysfunction (HCC)    s/p PPM  . Sleep apnea    compliant with CPAP  . Unspecified hypothyroidism   . Unspecified venous (peripheral) insufficiency     Past Surgical History:  Procedure Laterality Date  . CATARACT EXTRACTION W/PHACO Left 09/09/2014   Procedure: CATARACT EXTRACTION PHACO AND INTRAOCULAR LENS PLACEMENT (IOC);  Surgeon: Tonny Branch, MD;  Location: [facility];  Service: Ophthalmology;  Laterality: Left;  CDE: 6.99  . CATARACT EXTRACTION W/PHACO Right 10/07/2014   Procedure: CATARACT EXTRACTION PHACO AND INTRAOCULAR LENS PLACEMENT RIGHT EYE;  Surgeon: Tonny Branch, MD;  Location: [facility];  Service: Ophthalmology;  Laterality: Right;  CDE:5.16  .  PACEMAKER INSERTION     SJM by JA    There were no vitals filed for this visit.  Subjective Assessment - 04/18/17 1635    Subjective  Pt reports she is tired today.  comes today with bandages off as she removed them yesterday to clean them.    Currently in Pain?  No/denies                    Wound Therapy - 04/18/17 1641    Pain Score  0-No pain    Dressing   medihoney gauze packed into wound with 2X2 medipore over       [facility] Adult PT Treatment/Exercise - 04/18/17 0001      Manual Therapy   Manual Therapy  Compression Bandaging    Manual therapy comments  completed seperately from all other aspects of treatment    Manual Lymphatic Drainage (MLD)  , including short neck, deep and superfical abdominal, inguinal axillary anastomosis and LE.  Posterior completed sidelying     Compression Bandaging  Short stretch bandaging  multiple layer with foam  for total LE                PT Short Term Goals - 03/16/17 1645      PT SHORT TERM GOAL #1   Title  Pt Lt LE volumes to  be decreased by 3 cm to reduce risk of wounds    Time  4    Period  Weeks    Status  On-going      PT SHORT TERM GOAL #2   Title  PT Lt LE to no longer have heat or redness for evidence of decreased cellulitis.     Time  4    Period  Weeks    Status  On-going      PT SHORT TERM GOAL #3   Title  PT to state that she has called [employer] to acquire new juxtafit garment.     Time  4    Period  Weeks    Status  On-going      PT SHORT TERM GOAL #4   Title  PT to be ambulating with a rolling walker in order to reduce risk of falling     Time  2    Period  Weeks    Status  On-going        PT Long Term Goals - 03/16/17 1645      PT LONG TERM GOAL #1   Title  PT LT LE volume to be decreased by 6 cm to decrease pain to no greater than a 2/10 to allow pt to stand for 5-10 minutes without having to sit down to make a small meal.     Time  8    Period  Weeks    Status  On-going       PT LONG TERM GOAL #2   Title  Pt to be walking with a rolling walker for a 30 minutes at a time to improve lymph circulation.     Time  8    Period  Weeks    Status  On-going      PT LONG TERM GOAL #3   Title  Pt to have acquired  new juxtafit to ensure proper compression to prevent future exacerbation of lymphedema.     Time  8    Period  Weeks    Status  On-going            Plan - 04/18/17 1642    Clinical Impression Statement  Bandages off this session with noted dryness of skin.  Massage completed followed by moisturizing to entire Lt LE.  Woundcare including debridment and redressing using medihoney gel gauze to Lt distal LE.  Induration continues to decrease surrounding this wound.  No induration in other areas, overall improving.  continued with multilayer short stretch bandaging.  Pt reported overall comfort.     Rehab Potential  Good    PT Frequency  3x / week    PT Duration  6 weeks    PT Treatment/Interventions  ADLs/Self Care Home Management;Patient/family education;Manual lymph drainage;Compression bandaging    PT Next Visit Plan  continue with complete lymphedema therapy.  Continue to treat area behind knee and wound on lateral ankle.  Re-measure wound and volumes on Wednesday.    PT Home Exercise Plan  lymphedema exercises     Consulted and Agree with Plan of Care  Patient       Patient will benefit from skilled therapeutic intervention in order to improve the following deficits and impairments:  Pain, Decreased strength, Obesity, Difficulty walking, Decreased activity tolerance, Increased edema  Visit Diagnosis: Lymphedema, not elsewhere classified  Pain in left leg  Difficulty in walking, not elsewhere classified     Problem List Patient Active Problem List  Diagnosis Date Noted  . OAB (overactive bladder) 02/22/2017  . Lymphedema 01/26/2017  . Gastroesophageal reflux disease without esophagitis 01/26/2017  . Acquired hypothyroidism 01/26/2017  .  Mixed hyperlipidemia 01/26/2017  . Pyrexia   . Sleeps in sitting position due to orthopnea 07/31/2015  . Acute on chronic systolic congestive heart failure ([place]) 06/09/2015  . Morbid obesity due to excess calories ([place]) 06/09/2015  . PVD (peripheral vascular disease) with claudication ([place]) 06/09/2015  . OSA (obstructive sleep apnea) 06/09/2015  . Long term (current) use of anticoagulants 06/19/2010  . Tachycardia-bradycardia syndrome ([place]) 11/18/2008  . PPM-St.Jude 11/18/2008  . Hyperlipidemia associated with type 2 diabetes mellitus ([place]) 02/21/2008  . HYPERTENSION, BENIGN 02/21/2008  . ATRIAL FIBRILLATION 02/21/2008   Teena Irani, PTA/CLT [phone]  Teena Irani 04/18/2017, 4:44 PM  [facility] [place], [place], [address] Phone: [phone]   Fax:  [phone]  Name: Megan [name] MRN: [id number] Date of Birth: 03-03-1946

## 2017-04-20 ENCOUNTER — Telehealth (HOSPITAL_COMMUNITY): Payer: Self-pay | Admitting: Physician Assistant

## 2017-04-20 ENCOUNTER — Ambulatory Visit (HOSPITAL_COMMUNITY): Payer: Medicare Other | Admitting: Physical Therapy

## 2017-04-20 ENCOUNTER — Encounter (HOSPITAL_COMMUNITY): Payer: Medicare Other | Admitting: Physical Therapy

## 2017-04-20 DIAGNOSIS — R69 Illness, unspecified: Secondary | ICD-10-CM | POA: Diagnosis not present

## 2017-04-20 NOTE — Telephone Encounter (Signed)
04/20/17  pt left a message to [facility] because she has ice on her walkway and afraid to come out

## 2017-04-22 ENCOUNTER — Ambulatory Visit (HOSPITAL_COMMUNITY): Payer: Medicare Other | Admitting: Physical Therapy

## 2017-04-22 ENCOUNTER — Encounter (HOSPITAL_COMMUNITY): Payer: Self-pay | Admitting: Physical Therapy

## 2017-04-22 ENCOUNTER — Other Ambulatory Visit: Payer: Self-pay

## 2017-04-22 ENCOUNTER — Ambulatory Visit: Payer: Medicare Other | Admitting: Physician Assistant

## 2017-04-22 ENCOUNTER — Encounter (HOSPITAL_COMMUNITY): Payer: Medicare Other | Admitting: Physical Therapy

## 2017-04-22 DIAGNOSIS — I890 Lymphedema, not elsewhere classified: Secondary | ICD-10-CM | POA: Diagnosis not present

## 2017-04-22 DIAGNOSIS — M79605 Pain in left leg: Secondary | ICD-10-CM

## 2017-04-22 DIAGNOSIS — R262 Difficulty in walking, not elsewhere classified: Secondary | ICD-10-CM

## 2017-04-22 NOTE — Therapy (Signed)
[facility] [place], [place], [address] Phone: [phone]   Fax:  [phone]  Physical Therapy Treatment  Patient Details  Name: Megan [name] MRN: [id number] Date of Birth: June 22, 1945 Referring Provider: Madie Reno   Encounter Date: 04/22/2017  PT End of Session - 04/22/17 1200    Visit Number  16    Number of Visits  27    Date for PT Re-Evaluation  04/13/17    Authorization Type  Medicare    Authorization - Visit Number  16    Authorization - Number of Visits  27    PT Start Time  0900    PT Stop Time  1030    PT Time Calculation (min)  90 min    Equipment Utilized During Treatment  Gait belt    Activity Tolerance  Patient tolerated treatment well    Behavior During Therapy  WFL for tasks assessed/performed       Past Medical History:  Diagnosis Date  . Acute diastolic heart failure ([place])   . Anxiety state, unspecified   . Congestive heart failure, unspecified   . Degenerative joint disease   . GERD (gastroesophageal reflux disease)   . Hyperlipidemia   . Hypertension   . Lymphedema   . Obesity   . Persistent atrial fibrillation (HCC)     post-termination pauses with afib s/p PPM  . Presence of permanent cardiac pacemaker   . Sinoatrial node dysfunction (HCC)    s/p PPM  . Sleep apnea    compliant with CPAP  . Unspecified hypothyroidism   . Unspecified venous (peripheral) insufficiency     Past Surgical History:  Procedure Laterality Date  . CATARACT EXTRACTION W/PHACO Left 09/09/2014   Procedure: CATARACT EXTRACTION PHACO AND INTRAOCULAR LENS PLACEMENT (IOC);  Surgeon: Tonny Branch, MD;  Location: [facility];  Service: Ophthalmology;  Laterality: Left;  CDE: 6.99  . CATARACT EXTRACTION W/PHACO Right 10/07/2014   Procedure: CATARACT EXTRACTION PHACO AND INTRAOCULAR LENS PLACEMENT RIGHT EYE;  Surgeon: Tonny Branch, MD;  Location: [facility];  Service: Ophthalmology;  Laterality: Right;  CDE:5.16  .  PACEMAKER INSERTION     SJM by JA    There were no vitals filed for this visit.  Subjective Assessment - 04/22/17 1157    Subjective  Pt states that walking is getting easier.  She is still having some soreness where her wound is.     Pertinent History  lymphedema, HTN, AFib CHF  obesity,     Limitations  Standing;Walking    How long can you sit comfortably?  no  problem    How long can you stand comfortably?  no longer than a minute without support.    How long can you walk comfortably?  a couple of minutes with assistive device     Currently in Pain?  Yes    Pain Score  3     Pain Location  Leg    Pain Orientation  Left;Lower;Lateral    Pain Descriptors / Indicators  Aching;Sore    Pain Type  Acute pain    Pain Onset  1 to 4 weeks ago            LYMPHEDEMA/ONCOLOGY QUESTIONNAIRE - 04/22/17 0921      Left Lower Extremity Lymphedema   20 cm Proximal to Suprapatella  76 cm    10 cm Proximal to Suprapatella  66 cm was 73 at initial evaluation     At [place]  52 cm was 58    30 cm Proximal to Floor at Lateral Plantar Foot  49.1 cm was 61    20 cm Proximal to Floor at Lateral Plantar Foot  40 cm was 53.6    10 cm Proximal to Floor at Lateral Malleoli  33 cm was 41.6    Circumference of ankle/heel  32.3 cm. was 36.3    5 cm Proximal to 1st MTP Joint  23.2 cm was 25.2    Across MTP Joint  23 cm was 25.2    Around Proximal Great Toe  8.8 cm was 9    Other  wound .7 x 1.0  depth 1.0 majority of depth is at .3 cm but small tract thatis deeper               [facility] Adult PT Treatment/Exercise - 04/22/17 0001      Manual Therapy   Manual Therapy  Compression Bandaging    Manual therapy comments  completed seperately from all other aspects of treatment    Manual Lymphatic Drainage (MLD)  , including short neck, deep and superfical abdominal, inguinal axillary anastomosis and LE.  Posterior completed sidelying     Compression Bandaging  Short stretch  bandaging  multiple layer with foam  for total LE                PT Short Term Goals - 04/22/17 1208      PT SHORT TERM GOAL #1   Title  Pt Lt LE volumes to be decreased by 3 cm to reduce risk of wounds    Time  4    Period  Weeks    Status  Achieved      PT SHORT TERM GOAL #2   Title  PT Lt LE to no longer have heat or redness for evidence of decreased cellulitis.     Time  4    Period  Weeks    Status  Achieved      PT SHORT TERM GOAL #3   Title  PT to state that she has called [employer] to acquire new juxtafit garment.     Time  4    Period  Weeks    Status  On-going      PT SHORT TERM GOAL #4   Title  PT to be ambulating with a rolling walker in order to reduce risk of falling     Baseline  Now that pain and volume has decreased pt is able to walk safely with her cane     Time  2    Period  Weeks    Status  Revised        PT Long Term Goals - 04/22/17 1209      PT LONG TERM GOAL #1   Title  PT LT LE volume to be decreased by 6 cm to decrease pain to no greater than a 2/10 to allow pt to stand for 5-10 minutes without having to sit down to make a small meal.     Time  8    Period  Weeks    Status  Partially Met in areas       PT LONG TERM GOAL #2   Title  Pt to be walking with a rolling walker for a 30 minutes at a time to improve lymph circulation.     Time  8    Period  Weeks    Status  On-going  PT LONG TERM GOAL #3   Title  Pt to have acquired  new juxtafit to ensure proper compression to prevent future exacerbation of lymphedema.     Time  8    Period  Weeks    Status  On-going            Plan - 04/22/17 1201    Clinical Impression Statement  Pt volumes continue to overall decrease.  The area of yeast aroung pt posterior knee is now gone and will no longer be addressed.   The area of induration is significanty decreasing but continues to be significant induration where the wound is located.  Therapist complete 10 minutes of manual  on this area alone along with total body decongestive techniquees.;  Although it appears that the wound has gotten deeper I do not beleive this is so the area that goes down for 1 cm is very small and was overlooked before.  Megan [name] will continue to benefit from both wound care and total decongestive techniques for three more weeks.  At this time I hope her volumes will have stabilized and she will have aquired new juxtafit and the wound will be managable for her son to assist with care.      Rehab Potential  Good    PT Frequency  3x / week    PT Duration  Other (comment) 9 weeks (3 additional weeks )    PT Treatment/Interventions  ADLs/Self Care Home Management;Patient/family education;Manual lymph drainage;Compression bandaging nonhealing wound     PT Next Visit Plan  continue with complete lymphedema therapy.  Marland Kitchen  Re-measure wound and volumes on Wednesday.    PT Home Exercise Plan  lymphedema exercises     Consulted and Agree with Plan of Care  Patient       Patient will benefit from skilled therapeutic intervention in order to improve the following deficits and impairments:  Pain, Decreased strength, Obesity, Difficulty walking, Decreased activity tolerance, Increased edema(nonhealing wound )  Visit Diagnosis: Lymphedema, not elsewhere classified  Pain in left leg  Difficulty in walking, not elsewhere classified     Problem List Patient Active Problem List   Diagnosis Date Noted  . OAB (overactive bladder) 02/22/2017  . Lymphedema 01/26/2017  . Gastroesophageal reflux disease without esophagitis 01/26/2017  . Acquired hypothyroidism 01/26/2017  . Mixed hyperlipidemia 01/26/2017  . Pyrexia   . Sleeps in sitting position due to orthopnea 07/31/2015  . Acute on chronic systolic congestive heart failure ([place]) 06/09/2015  . Morbid obesity due to excess calories ([place]) 06/09/2015  . PVD (peripheral vascular disease) with claudication ([place]) 06/09/2015  . OSA (obstructive sleep  apnea) 06/09/2015  . Long term (current) use of anticoagulants 06/19/2010  . Tachycardia-bradycardia syndrome ([place]) 11/18/2008  . PPM-[place] 11/18/2008  . Hyperlipidemia associated with type 2 diabetes mellitus ([place]) 02/21/2008  . HYPERTENSION, BENIGN 02/21/2008  . ATRIAL FIBRILLATION 02/21/2008    Rayetta Humphrey, PT CLT [phone] 04/22/2017, 12:14 PM  [facility] [address] [place], [place], [address] Phone: [phone]   Fax:  [phone]  Name: Megan [name] MRN: [id number] Date of Birth: 1945-08-22

## 2017-04-25 ENCOUNTER — Ambulatory Visit: Payer: Medicare Other | Admitting: Physician Assistant

## 2017-04-25 ENCOUNTER — Encounter (HOSPITAL_COMMUNITY): Payer: Medicare Other | Admitting: Physical Therapy

## 2017-04-25 ENCOUNTER — Ambulatory Visit (HOSPITAL_COMMUNITY): Payer: Medicare Other | Admitting: Physical Therapy

## 2017-04-25 DIAGNOSIS — I890 Lymphedema, not elsewhere classified: Secondary | ICD-10-CM

## 2017-04-25 DIAGNOSIS — R262 Difficulty in walking, not elsewhere classified: Secondary | ICD-10-CM | POA: Diagnosis not present

## 2017-04-25 DIAGNOSIS — M79605 Pain in left leg: Secondary | ICD-10-CM | POA: Diagnosis not present

## 2017-04-25 NOTE — Therapy (Signed)
[facility] [address] [place], [place], [address] Phone: [phone]   Fax:  [phone]  Physical Therapy Treatment  Patient Details  Name: Megan [name] MRN: [id number] Date of Birth: 1945-05-31 Referring Provider: Madie Reno   Encounter Date: 04/25/2017  PT End of Session - 04/25/17 1250    Visit Number  17    Number of Visits  27    Date for PT Re-Evaluation  04/13/17    Authorization Type  Medicare    Authorization - Visit Number  77    Authorization - Number of Visits  27    PT Start Time  0900    PT Stop Time  1020    PT Time Calculation (min)  80 min    Equipment Utilized During Treatment  Gait belt    Activity Tolerance  Patient tolerated treatment well    Behavior During Therapy  WFL for tasks assessed/performed       Past Medical History:  Diagnosis Date  . Acute diastolic heart failure ([place])   . Anxiety state, unspecified   . Congestive heart failure, unspecified   . Degenerative joint disease   . GERD (gastroesophageal reflux disease)   . Hyperlipidemia   . Hypertension   . Lymphedema   . Obesity   . Persistent atrial fibrillation (HCC)     post-termination pauses with afib s/p PPM  . Presence of permanent cardiac pacemaker   . Sinoatrial node dysfunction (HCC)    s/p PPM  . Sleep apnea    compliant with CPAP  . Unspecified hypothyroidism   . Unspecified venous (peripheral) insufficiency     Past Surgical History:  Procedure Laterality Date  . CATARACT EXTRACTION W/PHACO Left 09/09/2014   Procedure: CATARACT EXTRACTION PHACO AND INTRAOCULAR LENS PLACEMENT (IOC);  Surgeon: Tonny Branch, MD;  Location: [facility];  Service: Ophthalmology;  Laterality: Left;  CDE: 6.99  . CATARACT EXTRACTION W/PHACO Right 10/07/2014   Procedure: CATARACT EXTRACTION PHACO AND INTRAOCULAR LENS PLACEMENT RIGHT EYE;  Surgeon: Tonny Branch, MD;  Location: [facility];  Service: Ophthalmology;  Laterality: Right;  CDE:5.16  .  PACEMAKER INSERTION     SJM by JA    There were no vitals filed for this visit.  Subjective Assessment - 04/25/17 1156    Subjective  Pt states she is doing well.  States she has no energy                       [facility] Adult PT Treatment/Exercise - 04/25/17 0001      Manual Therapy   Manual Therapy  Compression Bandaging    Manual therapy comments  completed seperately from all other aspects of treatment    Manual Lymphatic Drainage (MLD)  , including short neck, deep and superfical abdominal, inguinal axillary anastomosis and LE.  Posterior completed sidelying     Compression Bandaging  Short stretch bandaging  multiple layer with foam  for total LE                PT Short Term Goals - 04/22/17 1208      PT SHORT TERM GOAL #1   Title  Pt Lt LE volumes to be decreased by 3 cm to reduce risk of wounds    Time  4    Period  Weeks    Status  Achieved      PT SHORT TERM GOAL #2   Title  PT Lt LE to no  longer have heat or redness for evidence of decreased cellulitis.     Time  4    Period  Weeks    Status  Achieved      PT SHORT TERM GOAL #3   Title  PT to state that she has called [employer] to acquire new juxtafit garment.     Time  4    Period  Weeks    Status  On-going      PT SHORT TERM GOAL #4   Title  PT to be ambulating with a rolling walker in order to reduce risk of falling     Baseline  Now that pain and volume has decreased pt is able to walk safely with her cane     Time  2    Period  Weeks    Status  Revised        PT Long Term Goals - 04/22/17 1209      PT LONG TERM GOAL #1   Title  PT LT LE volume to be decreased by 6 cm to decrease pain to no greater than a 2/10 to allow pt to stand for 5-10 minutes without having to sit down to make a small meal.     Time  8    Period  Weeks    Status  Partially Met in areas       PT LONG TERM GOAL #2   Title  Pt to be walking with a rolling walker for a 30 minutes at a time to improve  lymph circulation.     Time  8    Period  Weeks    Status  On-going      PT LONG TERM GOAL #3   Title  Pt to have acquired  new juxtafit to ensure proper compression to prevent future exacerbation of lymphedema.     Time  8    Period  Weeks    Status  On-going            Plan - 04/25/17 1251    Clinical Impression Statement  Continued improvement noted in induration/edema.  Wound continues to drain moderately with sanginous drainage.  No signs/symptoms of active infection present.   Manual completed f/b heavy moisture prior to compression bandaging.      Rehab Potential  Good    PT Frequency  3x / week    PT Duration  Other (comment) 9 weeks (3 additional weeks )    PT Treatment/Interventions  ADLs/Self Care Home Management;Patient/family education;Manual lymph drainage;Compression bandaging nonhealing wound     PT Next Visit Plan  continue with complete lymphedema therapy.  Marland Kitchen  Re-measure wound and volumes on Wednesday.    PT Home Exercise Plan  lymphedema exercises     Consulted and Agree with Plan of Care  Patient       Patient will benefit from skilled therapeutic intervention in order to improve the following deficits and impairments:  Pain, Decreased strength, Obesity, Difficulty walking, Decreased activity tolerance, Increased edema(nonhealing wound )  Visit Diagnosis: Lymphedema, not elsewhere classified  Pain in left leg     Problem List Patient Active Problem List   Diagnosis Date Noted  . OAB (overactive bladder) 02/22/2017  . Lymphedema 01/26/2017  . Gastroesophageal reflux disease without esophagitis 01/26/2017  . Acquired hypothyroidism 01/26/2017  . Mixed hyperlipidemia 01/26/2017  . Pyrexia   . Sleeps in sitting position due to orthopnea 07/31/2015  . Acute on chronic systolic congestive heart failure ([place])  06/09/2015  . Morbid obesity due to excess calories ([place]) 06/09/2015  . PVD (peripheral vascular disease) with claudication ([place]) 06/09/2015  .  OSA (obstructive sleep apnea) 06/09/2015  . Long term (current) use of anticoagulants 06/19/2010  . Tachycardia-bradycardia syndrome ([place]) 11/18/2008  . PPM-St.Jude 11/18/2008  . Hyperlipidemia associated with type 2 diabetes mellitus ([place]) 02/21/2008  . HYPERTENSION, BENIGN 02/21/2008  . ATRIAL FIBRILLATION 02/21/2008   Teena Irani, PTA/CLT [phone]  Teena Irani 04/25/2017, 12:53 PM  [place], [place], [address] Phone: [phone]   Fax:  [phone]  Name: Megan [name] MRN: [id number] Date of Birth: [birth year]

## 2017-04-27 ENCOUNTER — Ambulatory Visit (HOSPITAL_COMMUNITY): Payer: Medicare Other | Admitting: Physical Therapy

## 2017-04-27 ENCOUNTER — Telehealth (HOSPITAL_COMMUNITY): Payer: Self-pay | Admitting: Physician Assistant

## 2017-04-27 ENCOUNTER — Encounter (HOSPITAL_COMMUNITY): Payer: Medicare Other | Admitting: Physical Therapy

## 2017-04-27 NOTE — Telephone Encounter (Signed)
04/27/17  patient left a message that she won't be able to make it today

## 2017-04-28 ENCOUNTER — Ambulatory Visit (INDEPENDENT_AMBULATORY_CARE_PROVIDER_SITE_OTHER): Payer: Medicare Other | Admitting: *Deleted

## 2017-04-28 ENCOUNTER — Encounter: Payer: Self-pay | Admitting: Physician Assistant

## 2017-04-28 DIAGNOSIS — I4891 Unspecified atrial fibrillation: Secondary | ICD-10-CM | POA: Diagnosis not present

## 2017-04-28 DIAGNOSIS — Z5181 Encounter for therapeutic drug level monitoring: Secondary | ICD-10-CM

## 2017-04-28 LAB — POCT INR: INR: 2.6

## 2017-04-28 NOTE — Patient Instructions (Signed)
Continue coumadin 1 tablet daily except 1 1/2 tablets on Sundays, Tuesdays and Thursdays Recheck in 3 weeks

## 2017-04-29 ENCOUNTER — Ambulatory Visit (HOSPITAL_COMMUNITY): Payer: Medicare Other | Attending: Family Medicine | Admitting: Physical Therapy

## 2017-04-29 ENCOUNTER — Encounter (HOSPITAL_COMMUNITY): Payer: Self-pay | Admitting: Physical Therapy

## 2017-04-29 ENCOUNTER — Other Ambulatory Visit: Payer: Self-pay

## 2017-04-29 ENCOUNTER — Encounter (HOSPITAL_COMMUNITY): Payer: Medicare Other | Admitting: Physical Therapy

## 2017-04-29 DIAGNOSIS — M79605 Pain in left leg: Secondary | ICD-10-CM | POA: Diagnosis not present

## 2017-04-29 DIAGNOSIS — I890 Lymphedema, not elsewhere classified: Secondary | ICD-10-CM | POA: Insufficient documentation

## 2017-04-29 DIAGNOSIS — S81802D Unspecified open wound, left lower leg, subsequent encounter: Secondary | ICD-10-CM | POA: Insufficient documentation

## 2017-04-29 DIAGNOSIS — R262 Difficulty in walking, not elsewhere classified: Secondary | ICD-10-CM | POA: Insufficient documentation

## 2017-04-29 DIAGNOSIS — X58XXXD Exposure to other specified factors, subsequent encounter: Secondary | ICD-10-CM | POA: Insufficient documentation

## 2017-04-29 NOTE — Therapy (Addendum)
[facility] [address] [place], [place], [address] Phone: [phone]   Fax:  [phone]  Physical Therapy Treatment  Patient Details  Name: Megan [name] MRN: [id number] Date of Birth: [birth year] Referring Provider: Madie Reno   Encounter Date: 04/29/2017  PT End of Session - 04/29/17 1208    Visit Number  18    Number of Visits  27    Date for PT Re-Evaluation  04/13/17    Authorization Type  Medicare    Authorization - Visit Number  18    Authorization - Number of Visits  27    PT Start Time  0815    PT Stop Time  0945    PT Time Calculation (min)  90 min    Equipment Utilized During Treatment  Gait belt    Activity Tolerance  Patient tolerated treatment well    Behavior During Therapy  WFL for tasks assessed/performed       Past Medical History:  Diagnosis Date  . Acute diastolic heart failure ([place])   . Anxiety state, unspecified   . Congestive heart failure, unspecified   . Degenerative joint disease   . GERD (gastroesophageal reflux disease)   . Hyperlipidemia   . Hypertension   . Lymphedema   . Obesity   . Persistent atrial fibrillation (H[facility])     post-termination pauses with afib s/p PPM  . Presence of permanent cardiac pacemaker   . Sinoatrial node dysfunction (HCC)    s/p PPM  . Sleep apnea    compliant with CPAP  . Unspecified hypothyroidism   . Unspecified venous (peripheral) insufficiency     Past Surgical History:  Procedure Laterality Date  . CATARACT EXTRACTION W/PHACO Left 09/09/2014   Procedure: CATARACT EXTRACTION PHACO AND INTRAOCULAR LENS PLACEMENT (IOC);  Surgeon: Tonny Branch, MD;  Location: [facility];  Service: Ophthalmology;  Laterality: Left;  CDE: 6.99  . CATARACT EXTRACTION W/PHACO Right 10/07/2014   Procedure: CATARACT EXTRACTION PHACO AND INTRAOCULAR LENS PLACEMENT RIGHT EYE;  Surgeon: Tonny Branch, MD;  Location: [facility];  Service: Ophthalmology;  Laterality: Right;  CDE:5.16  .  PACEMAKER INSERTION     SJM by JA    There were no vitals filed for this visit.  Subjective Assessment - 04/29/17 5277    Subjective  PT states that she is not having any pain today.  It is easier for her to get her legs onto her bed     Pertinent History  lymphedema, HTN, AFib CHF  obesity,     Limitations  Standing;Walking    How long can you sit comfortably?  no  problem    How long can you stand comfortably?  no longer than a minute without support.    How long can you walk comfortably?  a couple of minutes with assistive device     Pain Onset  1 to 4 weeks ago            LYMPHEDEMA/ONCOLOGY QUESTIONNAIRE - 04/29/17 8242      Date Lymphedema/Swelling Started   Date  02/27/17      What other symptoms do you have   Are you Having Heaviness or Tightness  Yes    Are you having Pain  Yes    Are you having pitting edema  Yes    Body Site  Left leg     Is it Hard or Difficult finding clothes that fit  Yes    Do you have infections  Yes Pt is on antibiotic     [place] Sign  Yes      Lymphedema Stage   Stage  STAGE 3 ELEPHANTIASIS      Lymphedema Assessments   Lymphedema Assessments  Lower extremities      Right Lower Extremity Lymphedema   10 cm Proximal to Suprapatella  73 cm    At Midpatella/Popliteal Crease  53 cm    30 cm Proximal to Floor at Lateral Plantar Foot  52 cm    20 cm Proximal to Floor at Lateral Plantar Foot  44 1    10 cm Proximal to Floor at Lateral Malleoli  29.4 cm    Circumference of ankle/heel  33 cm.    5 cm Proximal to 1st MTP Joint  23.7 cm    Across MTP Joint  23.3 cm    Around Proximal Great Toe  8.5 cm      Left Lower Extremity Lymphedema   20 cm Proximal to Suprapatella  75 cm    10 cm Proximal to Suprapatella  69.5 cm was 73 at initial evaluation     At Midpatella/Popliteal Crease  50.2 cm was 58    30 cm Proximal to Floor at Lateral Plantar Foot  48.7 cm was 61    20 cm Proximal to Floor at Lateral Plantar Foot  39.7 cm was 53.6     10 cm Proximal to Floor at Lateral Malleoli  33.3 cm was 41.6    Circumference of ankle/heel  32.3 cm. was 36.3    5 cm Proximal to 1st MTP Joint  21.8 cm was 25.2    Across MTP Joint  23 cm was 25.2    Around Proximal Great Toe  8.8 cm was 9    Other  wound .8 x 1.2 depth 1.0 majority of depth is at .3 cm but small tract thatis deeper               [facility] Adult PT Treatment/Exercise - 04/29/17 0001      Manual Therapy   Manual Therapy  Compression Bandaging    Manual therapy comments  completed seperately from all other aspects of treatment    Manual Lymphatic Drainage (MLD)  , including short neck, deep and superfical abdominal, inguinal axillary anastomosis and LE.  Posterior completed sidelying     Compression Bandaging  Short stretch bandaging  multiple layer with foam  for total LE                PT Short Term Goals - 04/29/17 1212      PT SHORT TERM GOAL #1   Title  Pt Lt LE volumes to be decreased by 3 cm to reduce risk of wounds    Time  4    Period  Weeks    Status  Achieved      PT SHORT TERM GOAL #2   Title  PT Lt LE to no longer have heat or redness for evidence of decreased cellulitis.     Time  4    Period  Weeks    Status  Achieved      PT SHORT TERM GOAL #3   Title  PT to state that she has called [employer] to acquire new juxtafit garment.     Time  4    Period  Weeks    Status  On-going      PT SHORT TERM GOAL #4   Title  PT to be ambulating  with a rolling walker in order to reduce risk of falling     Baseline  Now that pain and volume has decreased pt is able to walk safely with her cane     Time  2    Period  Weeks    Status  Revised        PT Long Term Goals - 04/29/17 1212      PT LONG TERM GOAL #1   Title  PT LT LE volume to be decreased by 6 cm to decrease pain to no greater than a 2/10 to allow pt to stand for 5-10 minutes without having to sit down to make a small meal.     Time  8    Period  Weeks    Status  Achieved  in areas       PT LONG TERM GOAL #2   Title  Pt to be walking with a rolling walker for a 30 minutes at a time to improve lymph circulation.     Baseline  revised to cane:  pt is not walking encouraged pt to do so.     Time  8    Period  Weeks    Status  On-going      PT LONG TERM GOAL #3   Title  Pt to have acquired  new juxtafit to ensure proper compression to prevent future exacerbation of lymphedema.     Time  8    Period  Weeks    Status  On-going            Plan - 04/29/17 1208    Clinical Impression Statement  Pt measured today.  Volumes are mixed but overall continue to decrease.  Extra manual completed at wound area where pt continues to have induration.  Pt has not heard from [facility] at this point.  Therapist called [employer] [facility] [employer] on status of the juxtafit.  Stated that they had not received any information.  Therapist resent order and evaluation to [facility]    Rehab Potential  Good    PT Frequency  3x / week    PT Duration  Other (comment) 9 weeks (3 additional weeks )    PT Treatment/Interventions  ADLs/Self Care Home Management;Patient/family education;Manual lymph drainage;Compression bandaging nonhealing wound     PT Next Visit Plan  PT will most likely be done with lymphedema therapy in the next week or two at this time we will need to get an order for wound care and decrease pt to one or two times a week.  Marland Kitchen  Re-measure wound and volumes on Wednesday.    PT Home Exercise Plan  lymphedema exercises     Consulted and Agree with Plan of Care  Patient       Patient will benefit from skilled therapeutic intervention in order to improve the following deficits and impairments:  Pain, Decreased strength, Obesity, Difficulty walking, Decreased activity tolerance, Increased edema(nonhealing wound )  Visit Diagnosis: Lymphedema, not elsewhere classified     Problem List Patient Active Problem List   Diagnosis Date Noted  . OAB (overactive bladder) 02/22/2017   . Lymphedema 01/26/2017  . Gastroesophageal reflux disease without esophagitis 01/26/2017  . Acquired hypothyroidism 01/26/2017  . Mixed hyperlipidemia 01/26/2017  . Pyrexia   . Sleeps in sitting position due to orthopnea 07/31/2015  . Acute on chronic systolic congestive heart failure ([place]) 06/09/2015  . Morbid obesity due to excess calories ([place]) 06/09/2015  . PVD (peripheral vascular disease)  with claudication ([place]) 06/09/2015  . OSA (obstructive sleep apnea) 06/09/2015  . Long term (current) use of anticoagulants 06/19/2010  . Tachycardia-bradycardia syndrome ([place]) 11/18/2008  . PPM-St.Jude 11/18/2008  . Hyperlipidemia associated with type 2 diabetes mellitus ([place]) 02/21/2008  . HYPERTENSION, BENIGN 02/21/2008  . ATRIAL FIBRILLATION 02/21/2008    Gregorey Nabor,Megan [name] 04/29/2017, 12:15 PM  [facility] [place], [place], [address] Phone: [phone]   Fax:  [phone]  Name: Megan [name] MRN: [id number] Date of Birth: 1946-03-15

## 2017-05-02 ENCOUNTER — Encounter (HOSPITAL_COMMUNITY): Payer: Medicare Other | Admitting: Physical Therapy

## 2017-05-02 ENCOUNTER — Ambulatory Visit (HOSPITAL_COMMUNITY): Payer: Medicare Other | Admitting: Physical Therapy

## 2017-05-02 DIAGNOSIS — M79605 Pain in left leg: Secondary | ICD-10-CM | POA: Diagnosis not present

## 2017-05-02 DIAGNOSIS — R262 Difficulty in walking, not elsewhere classified: Secondary | ICD-10-CM | POA: Diagnosis not present

## 2017-05-02 DIAGNOSIS — I890 Lymphedema, not elsewhere classified: Secondary | ICD-10-CM

## 2017-05-02 DIAGNOSIS — S81802D Unspecified open wound, left lower leg, subsequent encounter: Secondary | ICD-10-CM | POA: Diagnosis not present

## 2017-05-02 NOTE — Therapy (Signed)
[facility] [address] [place], [place], [address] Phone: [phone]   Fax:  [phone]  Physical Therapy Treatment  Patient Details  Name: Megan [name] MRN: [id number] Date of Birth: [birth year] Referring Provider: Madie Reno   Encounter Date: 05/02/2017  PT End of Session - 05/02/17 1049    Visit Number  19    Number of Visits  27    Date for PT Re-Evaluation  04/13/17    Authorization Type  Medicare    Authorization - Visit Number  55    Authorization - Number of Visits  27    PT Start Time  0910    PT Stop Time  1022    PT Time Calculation (min)  72 min    Equipment Utilized During Treatment  Gait belt    Activity Tolerance  Patient tolerated treatment well    Behavior During Therapy  WFL for tasks assessed/performed       Past Medical History:  Diagnosis Date  . Acute diastolic heart failure ([place])   . Anxiety state, unspecified   . Congestive heart failure, unspecified   . Degenerative joint disease   . GERD (gastroesophageal reflux disease)   . Hyperlipidemia   . Hypertension   . Lymphedema   . Obesity   . Persistent atrial fibrillation (HCC)     post-termination pauses with afib s/p PPM  . Presence of permanent cardiac pacemaker   . Sinoatrial node dysfunction (HCC)    s/p PPM  . Sleep apnea    compliant with CPAP  . Unspecified hypothyroidism   . Unspecified venous (peripheral) insufficiency     Past Surgical History:  Procedure Laterality Date  . CATARACT EXTRACTION W/PHACO Left 09/09/2014   Procedure: CATARACT EXTRACTION PHACO AND INTRAOCULAR LENS PLACEMENT (IOC);  Surgeon: Tonny Branch, MD;  Location: [facility];  Service: Ophthalmology;  Laterality: Left;  CDE: 6.99  . CATARACT EXTRACTION W/PHACO Right 10/07/2014   Procedure: CATARACT EXTRACTION PHACO AND INTRAOCULAR LENS PLACEMENT RIGHT EYE;  Surgeon: Tonny Branch, MD;  Location: [facility];  Service: Ophthalmology;  Laterality: Right;  CDE:5.16  .  PACEMAKER INSERTION     SJM by JA    There were no vitals filed for this visit.  Subjective Assessment - 05/02/17 1047    Subjective  Pt states her wound has been hurting a little.  States she has no energy.   States she's been having some "zapping" at her pacemaker.  Instructed to notify cardiologist.    Currently in Pain?  No/denies                      [facility] Adult PT Treatment/Exercise - 05/02/17 0001      Manual Therapy   Manual Therapy  Compression Bandaging    Manual therapy comments  completed seperately from all other aspects of treatment    Manual Lymphatic Drainage (MLD)  , including short neck, deep and superfical abdominal, inguinal axillary anastomosis and LE.  Posterior completed sidelying     Compression Bandaging  Short stretch bandaging  multiple layer with foam  for total LE                PT Short Term Goals - 04/29/17 1212      PT SHORT TERM GOAL #1   Title  Pt Lt LE volumes to be decreased by 3 cm to reduce risk of wounds    Time  4    Period  Weeks    Status  Achieved      PT SHORT TERM GOAL #2   Title  PT Lt LE to no longer have heat or redness for evidence of decreased cellulitis.     Time  4    Period  Weeks    Status  Achieved      PT SHORT TERM GOAL #3   Title  PT to state that she has called [employer] to acquire new juxtafit garment.     Time  4    Period  Weeks    Status  On-going      PT SHORT TERM GOAL #4   Title  PT to be ambulating with a rolling walker in order to reduce risk of falling     Baseline  Now that pain and volume has decreased pt is able to walk safely with her cane     Time  2    Period  Weeks    Status  Revised        PT Long Term Goals - 04/29/17 1212      PT LONG TERM GOAL #1   Title  PT LT LE volume to be decreased by 6 cm to decrease pain to no greater than a 2/10 to allow pt to stand for 5-10 minutes without having to sit down to make a small meal.     Time  8    Period  Weeks     Status  Achieved in areas       PT LONG TERM GOAL #2   Title  Pt to be walking with a rolling walker for a 30 minutes at a time to improve lymph circulation.     Baseline  revised to cane:  pt is not walking encouraged pt to do so.     Time  8    Period  Weeks    Status  On-going      PT LONG TERM GOAL #3   Title  Pt to have acquired  new juxtafit to ensure proper compression to prevent future exacerbation of lymphedema.     Time  8    Period  Weeks    Status  On-going            Plan - 05/02/17 1050    Clinical Impression Statement  Lt LE continues to improve with decreasing volume and induration. Wound continues to have depth and increased drainage when expressed, dark sanginous drainage.  Continued with medihoney packing f/b short stretch.  manual lymph drainage completed prior to anterior/posterior f/b moisurizer.      Rehab Potential  Good    PT Frequency  3x / week    PT Duration  Other (comment) 9 weeks (3 additional weeks )    PT Treatment/Interventions  ADLs/Self Care Home Management;Patient/family education;Manual lymph drainage;Compression bandaging nonhealing wound     PT Next Visit Plan  PT will most likely be done with lymphedema therapy in the next week or two at this time we will need to get an order for wound care and decrease pt to one or two times a week.  Marland Kitchen  Re-measure wound and volumes on Wednesday.    PT Home Exercise Plan  lymphedema exercises     Consulted and Agree with Plan of Care  Patient       Patient will benefit from skilled therapeutic intervention in order to improve the following deficits and impairments:  Pain, Decreased strength, Obesity, Difficulty walking,  Decreased activity tolerance, Increased edema(nonhealing wound )  Visit Diagnosis: Lymphedema, not elsewhere classified  Pain in left leg     Problem List Patient Active Problem List   Diagnosis Date Noted  . OAB (overactive bladder) 02/22/2017  . Lymphedema 01/26/2017  .  Gastroesophageal reflux disease without esophagitis 01/26/2017  . Acquired hypothyroidism 01/26/2017  . Mixed hyperlipidemia 01/26/2017  . Pyrexia   . Sleeps in sitting position due to orthopnea 07/31/2015  . Acute on chronic systolic congestive heart failure ([place]) 06/09/2015  . Morbid obesity due to excess calories ([place]) 06/09/2015  . PVD (peripheral vascular disease) with claudication ([place]) 06/09/2015  . OSA (obstructive sleep apnea) 06/09/2015  . Long term (current) use of anticoagulants 06/19/2010  . Tachycardia-bradycardia syndrome ([place]) 11/18/2008  . PPM-St.Jude 11/18/2008  . Hyperlipidemia associated with type 2 diabetes mellitus ([place]) 02/21/2008  . HYPERTENSION, BENIGN 02/21/2008  . ATRIAL FIBRILLATION 02/21/2008   Teena Irani, PTA/CLT [phone]  Teena Irani 05/02/2017, 11:26 AM  [facility] [place], [place], [address] Phone: [phone]   Fax:  [phone]  Name: Megan [name] MRN: [id number] Date of Birth: November 21, 1945

## 2017-05-04 ENCOUNTER — Encounter (HOSPITAL_COMMUNITY): Payer: Medicare Other | Admitting: Physical Therapy

## 2017-05-04 ENCOUNTER — Ambulatory Visit (HOSPITAL_COMMUNITY): Payer: Medicare Other | Admitting: Physical Therapy

## 2017-05-04 DIAGNOSIS — R262 Difficulty in walking, not elsewhere classified: Secondary | ICD-10-CM

## 2017-05-04 DIAGNOSIS — S81802D Unspecified open wound, left lower leg, subsequent encounter: Secondary | ICD-10-CM | POA: Diagnosis not present

## 2017-05-04 DIAGNOSIS — I890 Lymphedema, not elsewhere classified: Secondary | ICD-10-CM | POA: Diagnosis not present

## 2017-05-04 DIAGNOSIS — M79605 Pain in left leg: Secondary | ICD-10-CM | POA: Diagnosis not present

## 2017-05-04 NOTE — Therapy (Signed)
[facility] [address] [place], [place], [address] Phone: [phone]   Fax:  [phone]  Physical Therapy Treatment  Patient Details  Name: Megan [name] MRN: [id number] Date of Birth: 1945/04/03 Referring Provider: Madie Reno   Encounter Date: 05/04/2017  PT End of Session - 05/04/17 1107    Visit Number  20    Number of Visits  27    Date for PT Re-Evaluation  04/13/17    Authorization Type  Medicare    Authorization - Visit Number  20    Authorization - Number of Visits  27    PT Start Time  0908    PT Stop Time  1030    PT Time Calculation (min)  82 min    Equipment Utilized During Treatment  Gait belt    Activity Tolerance  Patient tolerated treatment well    Behavior During Therapy  WFL for tasks assessed/performed       Past Medical History:  Diagnosis Date  . Acute diastolic heart failure ([place])   . Anxiety state, unspecified   . Congestive heart failure, unspecified   . Degenerative joint disease   . GERD (gastroesophageal reflux disease)   . Hyperlipidemia   . Hypertension   . Lymphedema   . Obesity   . Persistent atrial fibrillation (HCC)     post-termination pauses with afib s/p PPM  . Presence of permanent cardiac pacemaker   . Sinoatrial node dysfunction (HCC)    s/p PPM  . Sleep apnea    compliant with CPAP  . Unspecified hypothyroidism   . Unspecified venous (peripheral) insufficiency     Past Surgical History:  Procedure Laterality Date  . CATARACT EXTRACTION W/PHACO Left 09/09/2014   Procedure: CATARACT EXTRACTION PHACO AND INTRAOCULAR LENS PLACEMENT (IOC);  Surgeon: Tonny Branch, MD;  Location: [facility];  Service: Ophthalmology;  Laterality: Left;  CDE: 6.99  . CATARACT EXTRACTION W/PHACO Right 10/07/2014   Procedure: CATARACT EXTRACTION PHACO AND INTRAOCULAR LENS PLACEMENT RIGHT EYE;  Surgeon: Tonny Branch, MD;  Location: [facility];  Service: Ophthalmology;  Laterality: Right;  CDE:5.16  .  PACEMAKER INSERTION     SJM by JA    There were no vitals filed for this visit.  Subjective Assessment - 05/04/17 1103    Subjective  Pt states she's not having any issues other than having no energy.  No longer having the "zapping" symptoms at her pacemaker and has not contacted MD regarding this.     Currently in Pain?  No/denies            LYMPHEDEMA/ONCOLOGY QUESTIONNAIRE - 05/04/17 1101      Left Lower Extremity Lymphedema   20 cm Proximal to Suprapatella  74 cm    10 cm Proximal to Suprapatella  69.5 cm    At Midpatella/Popliteal Crease  48 cm    30 cm Proximal to Floor at Lateral Plantar Foot  50 cm    20 cm Proximal to Floor at Lateral Plantar Foot  40 cm    10 cm Proximal to Floor at Lateral Malleoli  34 cm    Circumference of ankle/heel  31 cm.    5 cm Proximal to 1st MTP Joint  23.2 cm    Across MTP Joint  22.5 cm    Around Proximal Great Toe  9 cm    Other  0.6X0.6X 1.5-3.5cm (3.5 at 5:00).  [facility] Adult PT Treatment/Exercise - 05/04/17 0001      Manual Therapy   Manual Therapy  Compression Bandaging    Manual therapy comments  completed seperately from all other aspects of treatment    Manual Lymphatic Drainage (MLD)  , including short neck, deep and superfical abdominal, inguinal axillary anastomosis and LE.  Posterior completed sidelying     Compression Bandaging  Short stretch bandaging  multiple layer with foam  for total LE              PT Education - 05/04/17 1105    Education provided  Yes    Education Details  Reiterated the importance of contacting cardiologist regarding her pacemaker.  Discussed importance of sleeping in bed vs recliner and wearing her C-PAP.  encouarged to call and make appt to get her toenails trimmed.    Person(s) Educated  Patient    Methods  Explanation    Comprehension  Verbalized understanding       PT Short Term Goals - 04/29/17 1212      PT SHORT TERM GOAL #1   Title  Pt Lt LE volumes  to be decreased by 3 cm to reduce risk of wounds    Time  4    Period  Weeks    Status  Achieved      PT SHORT TERM GOAL #2   Title  PT Lt LE to no longer have heat or redness for evidence of decreased cellulitis.     Time  4    Period  Weeks    Status  Achieved      PT SHORT TERM GOAL #3   Title  PT to state that she has called [employer] to acquire new juxtafit garment.     Time  4    Period  Weeks    Status  On-going      PT SHORT TERM GOAL #4   Title  PT to be ambulating with a rolling walker in order to reduce risk of falling     Baseline  Now that pain and volume has decreased pt is able to walk safely with her cane     Time  2    Period  Weeks    Status  Revised        PT Long Term Goals - 04/29/17 1212      PT LONG TERM GOAL #1   Title  PT LT LE volume to be decreased by 6 cm to decrease pain to no greater than a 2/10 to allow pt to stand for 5-10 minutes without having to sit down to make a small meal.     Time  8    Period  Weeks    Status  Achieved in areas       PT LONG TERM GOAL #2   Title  Pt to be walking with a rolling walker for a 30 minutes at a time to improve lymph circulation.     Baseline  revised to cane:  pt is not walking encouraged pt to do so.     Time  8    Period  Weeks    Status  On-going      PT LONG TERM GOAL #3   Title  Pt to have acquired  new juxtafit to ensure proper compression to prevent future exacerbation of lymphedema.     Time  8    Period  Weeks    Status  On-going  Plan - 05/04/17 1117    Clinical Impression Statement  Lt LE remeasured this session with little difference from last weeks volumes.  Induration remains extremely minimal.  Wound has increased depth when measureed this session with tunneling at 5:00 at  depth of 3.5cm.  All other areas varied 1-2 cm.  Educated pateint on importance of increasing activity sleeping in bed (vs recliner) and using C-PAP.  Also reminded to contact cardioloigst  regarding her pacemaker issues she reported last session.  Pt then revealed she had a minor fall last week and hurt her shoulder and this is when she noticed the changes in her pacemaker.  Explained she needs to take charge of her health and increase her actvity if she wants to improve.  Pt verbalized understanding.     Rehab Potential  Good    PT Frequency  3x / week    PT Duration  Other (comment) 9 weeks (3 additional weeks )    PT Treatment/Interventions  ADLs/Self Care Home Management;Patient/family education;Manual lymph drainage;Compression bandaging nonhealing wound     PT Next Visit Plan  PT will most likely be done with lymphedema therapy in the next week or two at this time we will need to get an order for wound care and decrease pt to one or two times a week.  Evaluating therapist to discuss with patient next session.   Re-measure wound and volumes on Wednesday.    PT Home Exercise Plan  lymphedema exercises     Consulted and Agree with Plan of Care  Patient       Patient will benefit from skilled therapeutic intervention in order to improve the following deficits and impairments:  Pain, Decreased strength, Obesity, Difficulty walking, Decreased activity tolerance, Increased edema(nonhealing wound )  Visit Diagnosis: Lymphedema, not elsewhere classified  Pain in left leg  Difficulty in walking, not elsewhere classified     Problem List Patient Active Problem List   Diagnosis Date Noted  . OAB (overactive bladder) 02/22/2017  . Lymphedema 01/26/2017  . Gastroesophageal reflux disease without esophagitis 01/26/2017  . Acquired hypothyroidism 01/26/2017  . Mixed hyperlipidemia 01/26/2017  . Pyrexia   . Sleeps in sitting position due to orthopnea 07/31/2015  . Acute on chronic systolic congestive heart failure ([place]) 06/09/2015  . Morbid obesity due to excess calories ([place]) 06/09/2015  . PVD (peripheral vascular disease) with claudication ([place]) 06/09/2015  . OSA  (obstructive sleep apnea) 06/09/2015  . Long term (current) use of anticoagulants 06/19/2010  . Tachycardia-bradycardia syndrome ([place]) 11/18/2008  . PPM-St.Jude 11/18/2008  . Hyperlipidemia associated with type 2 diabetes mellitus ([place]) 02/21/2008  . HYPERTENSION, BENIGN 02/21/2008  . ATRIAL FIBRILLATION 02/21/2008   Teena Irani, PTA/CLT [phone]  Teena Irani 05/04/2017, [place], [place], [address] Phone: [phone]   Fax:  [phone]  Name: Megan [name] MRN: [id number] Date of Birth: 1945-10-03

## 2017-05-06 ENCOUNTER — Ambulatory Visit (HOSPITAL_COMMUNITY): Payer: Medicare Other | Admitting: Physical Therapy

## 2017-05-06 ENCOUNTER — Encounter (HOSPITAL_COMMUNITY): Payer: Self-pay | Admitting: Physical Therapy

## 2017-05-06 ENCOUNTER — Encounter (HOSPITAL_COMMUNITY): Payer: Medicare Other | Admitting: Physical Therapy

## 2017-05-06 DIAGNOSIS — S81802D Unspecified open wound, left lower leg, subsequent encounter: Secondary | ICD-10-CM | POA: Diagnosis not present

## 2017-05-06 DIAGNOSIS — I890 Lymphedema, not elsewhere classified: Secondary | ICD-10-CM

## 2017-05-06 DIAGNOSIS — R262 Difficulty in walking, not elsewhere classified: Secondary | ICD-10-CM | POA: Diagnosis not present

## 2017-05-06 DIAGNOSIS — M79605 Pain in left leg: Secondary | ICD-10-CM | POA: Diagnosis not present

## 2017-05-06 NOTE — Therapy (Signed)
[facility] [address] [place], [place], [address] Phone: [phone]   Fax:  [phone]  Physical Therapy Treatment  Patient Details  Name: Megan [name] MRN: [id number] Date of Birth: 12/22/1945 Referring Provider: Madie Reno   Encounter Date: 05/06/2017  PT End of Session - 05/06/17 1120    Visit Number  21    Number of Visits  27    Date for PT Re-Evaluation  04/13/17    Authorization Type  Medicare    Authorization - Visit Number  21    Authorization - Number of Visits  27    PT Start Time  0948    PT Stop Time  1113    PT Time Calculation (min)  85 min    Equipment Utilized During Treatment  Gait belt    Activity Tolerance  Patient tolerated treatment well    Behavior During Therapy  WFL for tasks assessed/performed       Past Medical History:  Diagnosis Date  . Acute diastolic heart failure ([place])   . Anxiety state, unspecified   . Congestive heart failure, unspecified   . Degenerative joint disease   . GERD (gastroesophageal reflux disease)   . Hyperlipidemia   . Hypertension   . Lymphedema   . Obesity   . Persistent atrial fibrillation (HCC)     post-termination pauses with afib s/p PPM  . Presence of permanent cardiac pacemaker   . Sinoatrial node dysfunction (HCC)    s/p PPM  . Sleep apnea    compliant with CPAP  . Unspecified hypothyroidism   . Unspecified venous (peripheral) insufficiency     Past Surgical History:  Procedure Laterality Date  . CATARACT EXTRACTION W/PHACO Left 09/09/2014   Procedure: CATARACT EXTRACTION PHACO AND INTRAOCULAR LENS PLACEMENT (IOC);  Surgeon: Tonny Branch, MD;  Location: [facility];  Service: Ophthalmology;  Laterality: Left;  CDE: 6.99  . CATARACT EXTRACTION W/PHACO Right 10/07/2014   Procedure: CATARACT EXTRACTION PHACO AND INTRAOCULAR LENS PLACEMENT RIGHT EYE;  Surgeon: Tonny Branch, MD;  Location: [facility];  Service: Ophthalmology;  Laterality: Right;  CDE:5.16  .  PACEMAKER INSERTION     SJM by JA    There were no vitals filed for this visit.  Subjective Assessment - 05/06/17 1116    Subjective  Pt states that she has not had any pain.  [facility] called but she did not understand what they were saying.  Pt states that she was suppose to go see the Dr. but she did not go and she recieved a letter from the MD requesting she make an appointment.      Pertinent History  lymphedema, HTN, AFib CHF  obesity,     Limitations  Standing;Walking    How long can you sit comfortably?  no  problem    How long can you stand comfortably?  no longer than a minute without support.    How long can you walk comfortably?  a couple of minutes with assistive device     Currently in Pain?  No/denies    Pain Onset  1 to 4 weeks ago                      [facility] Adult PT Treatment/Exercise - 05/06/17 0001      Manual Therapy   Manual Therapy  Compression Bandaging    Manual therapy comments  completed seperately from all other aspects of treatment    Manual Lymphatic Drainage (  MLD)  , including short neck, deep and superfical abdominal, inguinal axillary anastomosis and LE.  Posterior completed sidelying     Compression Bandaging  Short stretch bandaging  multiple layer with foam  for total LE                PT Short Term Goals - 04/29/17 1212      PT SHORT TERM GOAL #1   Title  Pt Lt LE volumes to be decreased by 3 cm to reduce risk of wounds    Time  4    Period  Weeks    Status  Achieved      PT SHORT TERM GOAL #2   Title  PT Lt LE to no longer have heat or redness for evidence of decreased cellulitis.     Time  4    Period  Weeks    Status  Achieved      PT SHORT TERM GOAL #3   Title  PT to state that she has called [employer] to acquire new juxtafit garment.     Time  4    Period  Weeks    Status  On-going      PT SHORT TERM GOAL #4   Title  PT to be ambulating with a rolling walker in order to reduce risk of falling      Baseline  Now that pain and volume has decreased pt is able to walk safely with her cane     Time  2    Period  Weeks    Status  Revised        PT Long Term Goals - 04/29/17 1212      PT LONG TERM GOAL #1   Title  PT LT LE volume to be decreased by 6 cm to decrease pain to no greater than a 2/10 to allow pt to stand for 5-10 minutes without having to sit down to make a small meal.     Time  8    Period  Weeks    Status  Achieved in areas       PT LONG TERM GOAL #2   Title  Pt to be walking with a rolling walker for a 30 minutes at a time to improve lymph circulation.     Baseline  revised to cane:  pt is not walking encouraged pt to do so.     Time  8    Period  Weeks    Status  On-going      PT LONG TERM GOAL #3   Title  Pt to have acquired  new juxtafit to ensure proper compression to prevent future exacerbation of lymphedema.     Time  8    Period  Weeks    Status  On-going            Plan - 05/06/17 1120    Clinical Impression Statement  Therapist urged pt to call  [facility] back and clarify as her last lymphedema treatment will most likely be Wednesday as her left leg appears smaller than her right.  PT urged to call and get an appointment witn MD so that we may proceed with wound care.  Area of induration around wound continues to get smaller.  Therapist again spent 10 minutes at this point to decrease induration.  Wound remains deep but no longer has any purulent drainage.     Rehab Potential  Good    PT Frequency  3x /  week    PT Duration  Other (comment) 9 weeks (3 additional weeks )    PT Treatment/Interventions  ADLs/Self Care Home Management;Patient/family education;Manual lymph drainage;Compression bandaging nonhealing wound     PT Next Visit Plan  Pt will be discharged next week from lymphedema pt will need wound care.    PT Home Exercise Plan  lymphedema exercises     Consulted and Agree with Plan of Care  Patient       Patient will benefit from  skilled therapeutic intervention in order to improve the following deficits and impairments:  Pain, Decreased strength, Obesity, Difficulty walking, Decreased activity tolerance, Increased edema(nonhealing wound )  Visit Diagnosis: Lymphedema, not elsewhere classified     Problem List Patient Active Problem List   Diagnosis Date Noted  . OAB (overactive bladder) 02/22/2017  . Lymphedema 01/26/2017  . Gastroesophageal reflux disease without esophagitis 01/26/2017  . Acquired hypothyroidism 01/26/2017  . Mixed hyperlipidemia 01/26/2017  . Pyrexia   . Sleeps in sitting position due to orthopnea 07/31/2015  . Acute on chronic systolic congestive heart failure ([place]) 06/09/2015  . Morbid obesity due to excess calories ([place]) 06/09/2015  . PVD (peripheral vascular disease) with claudication ([place]) 06/09/2015  . OSA (obstructive sleep apnea) 06/09/2015  . Long term (current) use of anticoagulants 06/19/2010  . Tachycardia-bradycardia syndrome ([place]) 11/18/2008  . PPM-St.Jude 11/18/2008  . Hyperlipidemia associated with type 2 diabetes mellitus ([place]) 02/21/2008  . HYPERTENSION, BENIGN 02/21/2008  . ATRIAL FIBRILLATION 02/21/2008    Rayetta Humphrey, PT CLT [phone] 05/06/2017, 11:25 AM  [facility] [place], [place], [address] Phone: [phone]   Fax:  [phone]  Name: Megan [name] MRN: [id number] Date of Birth: February 28, 1946

## 2017-05-09 ENCOUNTER — Ambulatory Visit (HOSPITAL_COMMUNITY): Payer: Medicare Other | Admitting: Physical Therapy

## 2017-05-09 ENCOUNTER — Encounter (HOSPITAL_COMMUNITY): Payer: Medicare Other | Admitting: Physical Therapy

## 2017-05-09 DIAGNOSIS — I890 Lymphedema, not elsewhere classified: Secondary | ICD-10-CM

## 2017-05-09 DIAGNOSIS — S81802D Unspecified open wound, left lower leg, subsequent encounter: Secondary | ICD-10-CM | POA: Diagnosis not present

## 2017-05-09 DIAGNOSIS — R262 Difficulty in walking, not elsewhere classified: Secondary | ICD-10-CM

## 2017-05-09 DIAGNOSIS — M79605 Pain in left leg: Secondary | ICD-10-CM

## 2017-05-09 NOTE — Therapy (Signed)
[facility] [address] [place], [place], [address] Phone: [phone]   Fax:  [phone]  Physical Therapy Treatment  Patient Details  Name: Megan [name] MRN: [id number] Date of Birth: 24-Jul-1945 Referring Provider: Madie Reno   Encounter Date: 05/09/2017  PT End of Session - 05/09/17 1029    Visit Number  22    Number of Visits  27    Date for PT Re-Evaluation  04/13/17    Authorization Type  Medicare    Authorization - Visit Number  50    Authorization - Number of Visits  27    PT Start Time  0900    PT Stop Time  0955    PT Time Calculation (min)  55 min    Equipment Utilized During Treatment  Gait belt    Activity Tolerance  Patient tolerated treatment well    Behavior During Therapy  WFL for tasks assessed/performed       Past Medical History:  Diagnosis Date  . Acute diastolic heart failure ([place])   . Anxiety state, unspecified   . Congestive heart failure, unspecified   . Degenerative joint disease   . GERD (gastroesophageal reflux disease)   . Hyperlipidemia   . Hypertension   . Lymphedema   . Obesity   . Persistent atrial fibrillation (HCC)     post-termination pauses with afib s/p PPM  . Presence of permanent cardiac pacemaker   . Sinoatrial node dysfunction (HCC)    s/p PPM  . Sleep apnea    compliant with CPAP  . Unspecified hypothyroidism   . Unspecified venous (peripheral) insufficiency     Past Surgical History:  Procedure Laterality Date  . CATARACT EXTRACTION W/PHACO Left 09/09/2014   Procedure: CATARACT EXTRACTION PHACO AND INTRAOCULAR LENS PLACEMENT (IOC);  Surgeon: Tonny Branch, MD;  Location: [facility];  Service: Ophthalmology;  Laterality: Left;  CDE: 6.99  . CATARACT EXTRACTION W/PHACO Right 10/07/2014   Procedure: CATARACT EXTRACTION PHACO AND INTRAOCULAR LENS PLACEMENT RIGHT EYE;  Surgeon: Tonny Branch, MD;  Location: [facility];  Service: Ophthalmology;  Laterality: Right;  CDE:5.16  .  PACEMAKER INSERTION     SJM by JA    There were no vitals filed for this visit.  Subjective Assessment - 05/09/17 1025    Subjective  Pt states she has not tried to call [facility] or MD office for appt.  STates she has urinated all over her proximal bandages.  No pain or issues.    Currently in Pain?  No/denies                      [facility] Adult PT Treatment/Exercise - 05/09/17 0001      Manual Therapy   Manual Therapy  Other (comment)    Compression Bandaging  Short stretch bandaging  multiple layer with foam  for distal LE only (pt to wash upper bandages)    Other Manual Therapy  woundcare to Lt lateral ankle wound.  Packed with medihoney gel, 2X2 and ABD pad.               PT Education - 05/09/17 1027    Education provided  Yes    Education Details  Instructed to contact [facility] and MD office TODAY to set up appointments.  Again, encouraged increased activity.  Instructed to wash and let air dry her upper thigh bandaging and bring juxtas on Friday.       PT Short Term  Goals - 04/29/17 1212      PT SHORT TERM GOAL #1   Title  Pt Lt LE volumes to be decreased by 3 cm to reduce risk of wounds    Time  4    Period  Weeks    Status  Achieved      PT SHORT TERM GOAL #2   Title  PT Lt LE to no longer have heat or redness for evidence of decreased cellulitis.     Time  4    Period  Weeks    Status  Achieved      PT SHORT TERM GOAL #3   Title  PT to state that she has called [facility] to acquire new juxtafit garment.     Time  4    Period  Weeks    Status  On-going      PT SHORT TERM GOAL #4   Title  PT to be ambulating with a rolling walker in order to reduce risk of falling     Baseline  Now that pain and volume has decreased pt is able to walk safely with her cane     Time  2    Period  Weeks    Status  Revised        PT Long Term Goals - 04/29/17 1212      PT LONG TERM GOAL #1   Title  PT LT LE volume to be decreased by 6 cm to  decrease pain to no greater than a 2/10 to allow pt to stand for 5-10 minutes without having to sit down to make a small meal.     Time  8    Period  Weeks    Status  Achieved in areas       PT LONG TERM GOAL #2   Title  Pt to be walking with a rolling walker for a 30 minutes at a time to improve lymph circulation.     Baseline  revised to cane:  pt is not walking encouraged pt to do so.     Time  8    Period  Weeks    Status  On-going      PT LONG TERM GOAL #3   Title  Pt to have acquired  new juxtafit to ensure proper compression to prevent future exacerbation of lymphedema.     Time  8    Period  Weeks    Status  On-going            Plan - 05/09/17 1029    Clinical Impression Statement  Therapist attempted to contact [facility] during session.  voicemail answered and message left to schedule patient.  No longer receiveing manual for Lt LE, woundcare only.  Re-faxed order to begin pulse lavage on Lt LE.  continued with multilayer compression to knee level only since uppers were soiled.      Rehab Potential  Good    PT Frequency  3x / week    PT Duration  Other (comment) 9 weeks (3 additional weeks )    PT Treatment/Interventions  ADLs/Self Care Home Management;Patient/family education;Manual lymph drainage;Compression bandaging nonhealing wound     PT Next Visit Plan  Continue with woundcare only (d/c manual lymph drainage).  Begin pulse lavage for better irrigation of wound once order received.  Continue with compression bandaging.      PT Home Exercise Plan  lymphedema exercises     Consulted and Agree with Plan of  Care  Patient       Patient will benefit from skilled therapeutic intervention in order to improve the following deficits and impairments:  Pain, Decreased strength, Obesity, Difficulty walking, Decreased activity tolerance, Increased edema(nonhealing wound )  Visit Diagnosis: Lymphedema, not elsewhere classified  Pain in left leg  Difficulty in walking, not  elsewhere classified     Problem List Patient Active Problem List   Diagnosis Date Noted  . OAB (overactive bladder) 02/22/2017  . Lymphedema 01/26/2017  . Gastroesophageal reflux disease without esophagitis 01/26/2017  . Acquired hypothyroidism 01/26/2017  . Mixed hyperlipidemia 01/26/2017  . Pyrexia   . Sleeps in sitting position due to orthopnea 07/31/2015  . Acute on chronic systolic congestive heart failure ([place]) 06/09/2015  . Morbid obesity due to excess calories ([place]) 06/09/2015  . PVD (peripheral vascular disease) with claudication ([place]) 06/09/2015  . OSA (obstructive sleep apnea) 06/09/2015  . Long term (current) use of anticoagulants 06/19/2010  . Tachycardia-bradycardia syndrome ([place]) 11/18/2008  . PPM-St.Jude 11/18/2008  . Hyperlipidemia associated with type 2 diabetes mellitus ([place]) 02/21/2008  . HYPERTENSION, BENIGN 02/21/2008  . ATRIAL FIBRILLATION 02/21/2008   Teena Irani, PTA/CLT [phone]  Teena Irani 05/09/2017, 10:33 AM  [facility] [place], [place], [address] Phone: [phone]   Fax:  [phone]  Name: KEEVA REISEN MRN: [id number] Date of Birth: 1945/12/05

## 2017-05-10 DIAGNOSIS — M79676 Pain in unspecified toe(s): Secondary | ICD-10-CM | POA: Diagnosis not present

## 2017-05-10 DIAGNOSIS — B351 Tinea unguium: Secondary | ICD-10-CM | POA: Diagnosis not present

## 2017-05-10 DIAGNOSIS — L84 Corns and callosities: Secondary | ICD-10-CM | POA: Diagnosis not present

## 2017-05-10 DIAGNOSIS — I70203 Unspecified atherosclerosis of native arteries of extremities, bilateral legs: Secondary | ICD-10-CM | POA: Diagnosis not present

## 2017-05-11 ENCOUNTER — Encounter (HOSPITAL_COMMUNITY): Payer: Medicare Other | Admitting: Physical Therapy

## 2017-05-11 ENCOUNTER — Ambulatory Visit (HOSPITAL_COMMUNITY): Payer: Medicare Other | Admitting: Physical Therapy

## 2017-05-11 ENCOUNTER — Ambulatory Visit (INDEPENDENT_AMBULATORY_CARE_PROVIDER_SITE_OTHER): Payer: Medicare Other | Admitting: Physician Assistant

## 2017-05-11 ENCOUNTER — Encounter: Payer: Self-pay | Admitting: Physician Assistant

## 2017-05-11 VITALS — BP 139/82 | HR 90 | Temp 97.3°F | Ht 60.0 in | Wt 287.4 lb

## 2017-05-11 DIAGNOSIS — E785 Hyperlipidemia, unspecified: Secondary | ICD-10-CM

## 2017-05-11 DIAGNOSIS — L304 Erythema intertrigo: Secondary | ICD-10-CM | POA: Insufficient documentation

## 2017-05-11 DIAGNOSIS — S81802D Unspecified open wound, left lower leg, subsequent encounter: Secondary | ICD-10-CM | POA: Diagnosis not present

## 2017-05-11 DIAGNOSIS — E1169 Type 2 diabetes mellitus with other specified complication: Secondary | ICD-10-CM

## 2017-05-11 DIAGNOSIS — R262 Difficulty in walking, not elsewhere classified: Secondary | ICD-10-CM | POA: Diagnosis not present

## 2017-05-11 DIAGNOSIS — M79605 Pain in left leg: Secondary | ICD-10-CM

## 2017-05-11 DIAGNOSIS — I890 Lymphedema, not elsewhere classified: Secondary | ICD-10-CM | POA: Diagnosis not present

## 2017-05-11 NOTE — Therapy (Signed)
[facility] [place], [place], [address] Phone: [phone]   Fax:  [phone]  Wound Care Therapy  Patient Details  Name: Megan [name] MRN: [id number] Date of Birth: 1945/06/21 Referring Provider: Madie Reno   Encounter Date: 05/11/2017  PT End of Session - 05/11/17 1025    Visit Number  23    Number of Visits  27    Date for PT Re-Evaluation  06/08/17    Authorization Type  Medicare    Authorization - Visit Number  23    Authorization - Number of Visits  27    PT Start Time  0900    PT Stop Time  1010    PT Time Calculation (min)  70 min    Equipment Utilized During Treatment  Gait belt    Activity Tolerance  Patient tolerated treatment well    Behavior During Therapy  WFL for tasks assessed/performed       Past Medical History:  Diagnosis Date  . Acute diastolic heart failure ([place])   . Anxiety state, unspecified   . Congestive heart failure, unspecified   . Degenerative joint disease   . GERD (gastroesophageal reflux disease)   . Hyperlipidemia   . Hypertension   . Lymphedema   . Obesity   . Persistent atrial fibrillation (HCC)     post-termination pauses with afib s/p PPM  . Presence of permanent cardiac pacemaker   . Sinoatrial node dysfunction (HCC)    s/p PPM  . Sleep apnea    compliant with CPAP  . Unspecified hypothyroidism   . Unspecified venous (peripheral) insufficiency     Past Surgical History:  Procedure Laterality Date  . CATARACT EXTRACTION W/PHACO Left 09/09/2014   Procedure: CATARACT EXTRACTION PHACO AND INTRAOCULAR LENS PLACEMENT (IOC);  Surgeon: Tonny Branch, MD;  Location: [facility];  Service: Ophthalmology;  Laterality: Left;  CDE: 6.99  . CATARACT EXTRACTION W/PHACO Right 10/07/2014   Procedure: CATARACT EXTRACTION PHACO AND INTRAOCULAR LENS PLACEMENT RIGHT EYE;  Surgeon: Tonny Branch, MD;  Location: [facility];  Service: Ophthalmology;  Laterality: Right;  CDE:5.16  . PACEMAKER  INSERTION     SJM by JA    There were no vitals filed for this visit.        LYMPHEDEMA/ONCOLOGY QUESTIONNAIRE - 05/11/17 1027      Right Lower Extremity Lymphedema   10 cm Proximal to Suprapatella  73 cm    At Midpatella/Popliteal Crease  53 cm    30 cm Proximal to Floor at Lateral Plantar Foot  52 cm    20 cm Proximal to Floor at Lateral Plantar Foot  44 1    10 cm Proximal to Floor at Lateral Malleoli  29.4 cm    Circumference of ankle/heel  33 cm.    5 cm Proximal to 1st MTP Joint  23.7 cm    Across MTP Joint  23.3 cm    Around Proximal Great Toe  8.5 cm      Left Lower Extremity Lymphedema   20 cm Proximal to Suprapatella  74 cm    10 cm Proximal to Suprapatella  69 cm    At Midpatella/Popliteal Crease  50 cm    30 cm Proximal to Floor at Lateral Plantar Foot  50.5 cm    20 cm Proximal to Floor at Lateral Plantar Foot  39 cm    10 cm Proximal to Floor at Lateral Malleoli  31 cm    Circumference of  ankle/heel  31.5 cm.    5 cm Proximal to 1st MTP Joint  23 cm    Across MTP Joint  23 cm    Around Proximal Great Toe  8.5 cm    Other  wound:  see wound assessment           Wound Therapy - 05/11/17 1015    Subjective  Pt states she is doing well today.  STates she has not been doing any exercises.  Comes today with clean bandages.      Pain Score  0-No pain    Wound Properties Date First Assessed: 04/10/17 Time First Assessed: 1014 Wound Type: Other (Comment) Location: Leg Location Orientation: Left   Dressing Type  Compression wrap;Gauze (Comment) medihoney    Dressing Changed  Changed    Dressing Status  New drainage    Dressing Change Frequency  Monday, Wednesday, Friday    Site / Wound Assessment  Bleeding;Clean    % Wound base Red or Granulating  100%    % Wound base Yellow/Fibrinous Exudate  0%    % Wound base Black/Eschar  0%    % Wound base Other/Granulation Tissue (Comment)  0%    Peri-wound Assessment  Edema;Induration    Wound Length (cm)  0.6 cm     Wound Width (cm)  0.6 cm    Wound Depth (cm)  1.5 cm    Wound Volume (cm^3)  0.54 cm^3    Wound Surface Area (cm^2)  0.36 cm^2    Tunneling (cm)  at 5:00 2 cm    Undermining (cm)  around all borders approx 1 cm    Margins  Unattached edges (unapproximated)    Drainage Amount  Moderate    Drainage Description  Serosanguineous    Treatment  Hydrotherapy (Pulse lavage)    Pulsed lavage therapy - wound location  Lt ankle    Pulsed Lavage with Suction (psi)  4 psi    Pulsed Lavage with Suction - Normal Saline Used  1000 mL    Pulsed Lavage Tip  Tip with splash shield    Wound Therapy - Clinical Statement  Order received to continue woundcare.  Added pulse lavage this session to better cleanse/irrigate wound due to depth.  Wound appears to be 100% granulated, however unsure as unable to see full depth of wound. Lt LE remeasured and no significant change from last week.  Per evaluating therapist, will now discontinue manual lymph drainage and continue with woundcare and compression bandaging only.  Pt to be measured and fitted for new juxtafit garments on Monday.  Continued with short stretch bandages today and medihoney packed into woundbed.      Wound Plan  discontinue manual lymph drainage and focus on wound care.  Continue pulse lavage with appropriate dressing followed by compression bandaging.  Change bandages to juxtafit when patient receives new ones.     Dressing   medihoney gauze packed into wound with 2X2 medipore over, multilayer short stretch and 1/2" foam                PT Short Term Goals - 05/11/17 1026      PT SHORT TERM GOAL #1   Title  Pt Lt LE volumes to be decreased by 3 cm to reduce risk of wounds    Time  4    Period  Weeks    Status  Achieved      PT SHORT TERM GOAL #2   Title  PT Lt  LE to no longer have heat or redness for evidence of decreased cellulitis.     Time  4    Period  Weeks    Status  Achieved      PT SHORT TERM GOAL #3   Title  PT to state  that she has called [facility] to acquire new juxtafit garment.     Time  4    Period  Weeks    Status  Achieved      PT SHORT TERM GOAL #4   Title  PT to be ambulating with a rolling walker in order to reduce risk of falling     Baseline  Now that pain and volume has decreased pt is able to walk safely with her cane     Time  2    Period  Weeks    Status  Achieved        PT Long Term Goals - 05/11/17 1027      PT LONG TERM GOAL #1   Title  PT LT LE volume to be decreased by 6 cm to decrease pain to no greater than a 2/10 to allow pt to stand for 5-10 minutes without having to sit down to make a small meal.     Time  8    Period  Weeks    Status  Achieved in areas       PT LONG TERM GOAL #2   Title  Pt to be walking with a rolling walker for a 30 minutes at a time to improve lymph circulation.     Baseline  revised to cane:  pt is not walking encouraged pt to do so.     Time  8    Period  Weeks    Status  On-going      PT LONG TERM GOAL #3   Title  Pt to have acquired  new juxtafit to ensure proper compression to prevent future exacerbation of lymphedema.     Time  8    Period  Weeks    Status  On-going              Patient will benefit from skilled therapeutic intervention in order to improve the following deficits and impairments:     Visit Diagnosis: Lymphedema, not elsewhere classified  Pain in left leg  Difficulty in walking, not elsewhere classified     Problem List Patient Active Problem List   Diagnosis Date Noted  . OAB (overactive bladder) 02/22/2017  . Lymphedema 01/26/2017  . Gastroesophageal reflux disease without esophagitis 01/26/2017  . Acquired hypothyroidism 01/26/2017  . Mixed hyperlipidemia 01/26/2017  . Pyrexia   . Sleeps in sitting position due to orthopnea 07/31/2015  . Acute on chronic systolic congestive heart failure ([place]) 06/09/2015  . Morbid obesity due to excess calories ([place]) 06/09/2015  . PVD (peripheral vascular  disease) with claudication ([place]) 06/09/2015  . OSA (obstructive sleep apnea) 06/09/2015  . Long term (current) use of anticoagulants 06/19/2010  . Tachycardia-bradycardia syndrome ([place]) 11/18/2008  . PPM-St.Jude 11/18/2008  . Hyperlipidemia associated with type 2 diabetes mellitus ([place]) 02/21/2008  . HYPERTENSION, BENIGN 02/21/2008  . ATRIAL FIBRILLATION 02/21/2008   Teena Irani, PTA/CLT [place], PT CLT [phone] 05/11/2017, 10:33 AM  [facility] [place], [place], [address] Phone: [phone]   Fax:  [phone]  Name: Megan [name] MRN: [id number] Date of Birth: Jul 15, 1945

## 2017-05-12 ENCOUNTER — Ambulatory Visit (INDEPENDENT_AMBULATORY_CARE_PROVIDER_SITE_OTHER): Payer: Medicare Other | Admitting: *Deleted

## 2017-05-12 DIAGNOSIS — Z5181 Encounter for therapeutic drug level monitoring: Secondary | ICD-10-CM

## 2017-05-12 DIAGNOSIS — I4891 Unspecified atrial fibrillation: Secondary | ICD-10-CM

## 2017-05-12 LAB — POCT INR: INR: 4.6

## 2017-05-12 NOTE — Patient Instructions (Signed)
Hold coumadin tonight, take 1/2 tablet tomorrow night then resume 1 tablet daily except 1 1/2 tablets on Sundays, Tuesdays and Thursdays Recheck 05/26/17

## 2017-05-13 ENCOUNTER — Ambulatory Visit (HOSPITAL_COMMUNITY): Payer: Medicare Other | Admitting: Physical Therapy

## 2017-05-13 ENCOUNTER — Encounter (HOSPITAL_COMMUNITY): Payer: Self-pay | Admitting: Physical Therapy

## 2017-05-13 ENCOUNTER — Encounter (HOSPITAL_COMMUNITY): Payer: Medicare Other | Admitting: Physical Therapy

## 2017-05-13 ENCOUNTER — Other Ambulatory Visit: Payer: Self-pay

## 2017-05-13 DIAGNOSIS — R262 Difficulty in walking, not elsewhere classified: Secondary | ICD-10-CM | POA: Diagnosis not present

## 2017-05-13 DIAGNOSIS — S81802D Unspecified open wound, left lower leg, subsequent encounter: Secondary | ICD-10-CM

## 2017-05-13 DIAGNOSIS — I890 Lymphedema, not elsewhere classified: Secondary | ICD-10-CM | POA: Diagnosis not present

## 2017-05-13 DIAGNOSIS — M79605 Pain in left leg: Secondary | ICD-10-CM | POA: Diagnosis not present

## 2017-05-13 NOTE — Therapy (Signed)
[facility] [place], [place], [address] Phone: [phone]   Fax:  [phone]  Wound Care Therapy  Patient Details  Name: Megan [name] MRN: [id number] Date of Birth: [birth year] Referring Provider: Madie Reno   Encounter Date: 05/13/2017  PT End of Session - 05/13/17 1211    Visit Number  24    Number of Visits 36   Date for PT Re-Evaluation  06/24/17    Authorization Type  Medicare    Authorization - Visit Number  24    Authorization - Number of Visits  36   PT Start Time  0945    PT Stop Time  1105    PT Time Calculation (min)  80 min    Equipment Utilized During Treatment  Gait belt    Activity Tolerance  Patient tolerated treatment well    Behavior During Therapy  WFL for tasks assessed/performed       Past Medical History:  Diagnosis Date  . Acute diastolic heart failure ([place])   . Anxiety state, unspecified   . Congestive heart failure, unspecified   . Degenerative joint disease   . GERD (gastroesophageal reflux disease)   . Hyperlipidemia   . Hypertension   . Lymphedema   . Obesity   . Persistent atrial fibrillation (H[facility])     post-termination pauses with afib s/p PPM  . Presence of permanent cardiac pacemaker   . Sinoatrial node dysfunction (HCC)    s/p PPM  . Sleep apnea    compliant with CPAP  . Unspecified hypothyroidism   . Unspecified venous (peripheral) insufficiency     Past Surgical History:  Procedure Laterality Date  . CATARACT EXTRACTION W/PHACO Left 09/09/2014   Procedure: CATARACT EXTRACTION PHACO AND INTRAOCULAR LENS PLACEMENT (IOC);  Surgeon: Tonny Branch, MD;  Location: [facility];  Service: Ophthalmology;  Laterality: Left;  CDE: 6.99  . CATARACT EXTRACTION W/PHACO Right 10/07/2014   Procedure: CATARACT EXTRACTION PHACO AND INTRAOCULAR LENS PLACEMENT RIGHT EYE;  Surgeon: Tonny Branch, MD;  Location: [facility];  Service: Ophthalmology;  Laterality: Right;  CDE:5.16  . PACEMAKER  INSERTION     SJM by JA    There were no vitals filed for this visit.              Wound Therapy - 05/13/17 1113    Subjective  Pt states that she is not having any pain.  She can tell that her thigh is already starting to get bigger.    Pain Score  0-No pain    Wound Properties Date First Assessed: 04/10/17 Time First Assessed: 1014 Wound Type: Other (Comment) Location: Leg Location Orientation: Left   Dressing Type  Compression wrap;Gauze (Comment) medihoney    Dressing Changed  Changed    Dressing Status  New drainage    Dressing Change Frequency  PRN    Site / Wound Assessment  Bleeding;Clean    % Wound base Red or Granulating  100%    % Wound base Yellow/Fibrinous Exudate  0%    % Wound base Black/Eschar  0%    % Wound base Other/Granulation Tissue (Comment)  0%    Peri-wound Assessment  Edema;Induration    Wound Length (cm)  0.6 cm    Wound Width (cm)  0.6 cm    Wound Depth (cm)  1.5 cm    Wound Volume (cm^3)  0.54 cm^3    Wound Surface Area (cm^2)  0.36 cm^2    Tunneling (cm)  2 cm     Undermining (cm)  allaround    Margins  Epibole (rolled edges)    Drainage Amount  Moderate    Drainage Description  Serosanguineous    Treatment  Hydrotherapy (Pulse lavage);Cleansed;Other (Comment)    Pulsed lavage therapy - wound location  Lt ankle    Pulsed Lavage with Suction (psi)  4 psi    Pulsed Lavage with Suction - Normal Saline Used  1000 mL    Pulsed Lavage Tip  Tip with splash shield    Wound Therapy - Clinical Statement  Pt completed blood work and her platlets are low so she is to lower her coumadin.  Pt has epiboled edges on wound but we will not debride these until her levels are within normal ranges.   Pt with noted increased induration in thigh area.  Pt educated on self massage and therapist stressed the importance of  completing self manual and adding compression to thigh area.  PT verbalized understanding.  Manual completed at ankle area where there is still  induration.  Residual water from Pulse lavage was cloudy with noted purulent drainage discharge with manual       Wound Plan  discontinue manual lymph drainage and focus on wound care.  Continue pulse lavage with appropriate dressing followed by compression bandaging.  Change bandages to juxtafit when patient receives new ones.     Dressing   medihoney gauze packed into wound with 2X2 medipore over, multilayer short stretch and 1/2" foam       Continue wound care and manual to decrease edema around wound for healing  And self care for 2x a week for  6 more weeks.        PT Education - 05/13/17 1210    Education provided  Yes    Education Details  Self massage to promote lymphatic circulation     Person(s) Educated  Patient    Methods  Explanation;Handout;Demonstration    Comprehension  Verbalized understanding;Returned demonstration;Need further instruction       PT Short Term Goals - 05/11/17 1026      PT SHORT TERM GOAL #1   Title  Pt Lt LE volumes to be decreased by 3 cm to reduce risk of wounds    Time  4    Period  Weeks    Status  Achieved      PT SHORT TERM GOAL #2   Title  PT Lt LE to no longer have heat or redness for evidence of decreased cellulitis.     Time  4    Period  Weeks    Status  Achieved      PT SHORT TERM GOAL #3   Title  PT to state that she has called [facility] to acquire new juxtafit garment.     Time  4    Period  Weeks    Status  Achieved      PT SHORT TERM GOAL #4   Title  PT to be ambulating with a rolling walker in order to reduce risk of falling     Baseline  Now that pain and volume has decreased pt is able to walk safely with her cane     Time  2    Period  Weeks    Status  Achieved        PT Long Term Goals - 05/13/17 1215      PT LONG TERM GOAL #1   Title  PT LT LE  volume to be decreased by 6 cm to decrease pain to no greater than a 2/10 to allow pt to stand for 5-10 minutes without having to sit down to make a small meal.      Time  8    Period  Weeks    Status  Achieved in areas       PT LONG TERM GOAL #2   Title  Pt to be walking with a rolling walker for a 30 minutes at a time to improve lymph circulation.     Baseline  revised to cane:  pt is not walking encouraged pt to do so.     Time  8    Period  Weeks    Status  On-going      PT LONG TERM GOAL #3   Title  Pt to have acquired  new juxtafit to ensure proper compression to prevent future exacerbation of lymphedema.     Time  8    Period  Weeks    Status  On-going      PT LONG TERM GOAL #4   Title  Wound on Left ankle to depth to be .5 cm with no tunneling to allow pt to be confident in self care.     Time  6    Period  Weeks    Target Date  06/24/17      PT LONG TERM GOAL #5   Title  PT to be confident and completing self massage techniques for lymph drainage.     Time  6    Period  Weeks            Plan - 05/13/17 1213    Clinical Impression Statement  Noted increased edma in pt thigh area.  Therapist educated pt on self massage and the need to get compression on her lower thigh ie capri.  Pt vocalized understanding.  Manual completed at ankle to decrease induration.  Noted purulent drainage with both manual and with pulse lavage.      Rehab Potential  Good    PT Frequency  2x / week    PT Duration  Other (comment) 9 weeks (3 additional weeks )    PT Treatment/Interventions  ADLs/Self Care Home Management;Patient/family education;Manual lymph drainage;Compression bandaging nonhealing wound     PT Next Visit Plan  Decrease pt to 2x a week.      PT Home Exercise Plan  lymphedema exercises     Consulted and Agree with Plan of Care  Patient       Patient will benefit from skilled therapeutic intervention in order to improve the following deficits and impairments:  Pain, Decreased strength, Obesity, Difficulty walking, Decreased activity tolerance, Increased edema(nonhealing wound )  Visit Diagnosis: Wound of left lower extremity,  subsequent encounter  Lymphedema, not elsewhere classified     Problem List Patient Active Problem List   Diagnosis Date Noted  . Intertrigo 05/11/2017  . OAB (overactive bladder) 02/22/2017  . Lymphedema 01/26/2017  . Gastroesophageal reflux disease without esophagitis 01/26/2017  . Acquired hypothyroidism 01/26/2017  . Mixed hyperlipidemia 01/26/2017  . Pyrexia   . Sleeps in sitting position due to orthopnea 07/31/2015  . Acute on chronic systolic congestive heart failure ([place]) 06/09/2015  . Morbid obesity due to excess calories ([place]) 06/09/2015  . PVD (peripheral vascular disease) with claudication ([place]) 06/09/2015  . OSA (obstructive sleep apnea) 06/09/2015  . Long term (current) use of anticoagulants 06/19/2010  . Tachycardia-bradycardia syndrome ([place]) 11/18/2008  .  PPM-St.Jude 11/18/2008  . Hyperlipidemia associated with type 2 diabetes mellitus ([place]) 02/21/2008  . HYPERTENSION, BENIGN 02/21/2008  . ATRIAL FIBRILLATION 02/21/2008   Rayetta Humphrey, PT CLT [phone] 05/13/2017, 12:22 PM  [facility] [address] [place], [place], [address] Phone: [phone]   Fax:  [phone]  Name: Megan [name] MRN: [id number] Date of Birth: 21-Dec-1945

## 2017-05-15 NOTE — Patient Instructions (Signed)
In a few days you may receive a survey in the mail or online from [employer] regarding your visit with us today. Please take a moment to fill this out. Your feedback is very important to our whole office. It can help us better understand your needs as well as improve your experience and satisfaction. Thank you for taking your time to complete it. We care about you.  Nefi Musich, PA-C  

## 2017-05-15 NOTE — Progress Notes (Signed)
BP 139/82   Pulse 90   Temp (!) 97.3 F (36.3 C) (Oral)   Ht 5' (1.524 m)   Wt 287 lb 6.4 oz (130.4 kg)   BMI 56.13 kg/m    Subjective:    Patient ID: Megan [name], female    DOB: 10/13/1945, 72 y.o.   MRN: [id number]  HPI: Megan [name] is a 72 y.o. female presenting on 05/11/2017 for Follow-up (from fall)  Patient comes in for recheck after her to follow-up.  She is currently being treated by wound care for the wound that she got when she fell.  She is not doing her current lymphedema treatment.  They will resume that after the wound has healed up.  We have also reviewed her cholesterol today.  In additionShe will need medication refill for her intertrigo.  Relevant past medical, surgical, family and social history reviewed and updated as indicated. Allergies and medications reviewed and updated.  Past Medical History:  Diagnosis Date  . Acute diastolic heart failure ([place])   . Anxiety state, unspecified   . Congestive heart failure, unspecified   . Degenerative joint disease   . GERD (gastroesophageal reflux disease)   . Hyperlipidemia   . Hypertension   . Lymphedema   . Obesity   . Persistent atrial fibrillation (H[facility])     post-termination pauses with afib s/p PPM  . Presence of permanent cardiac pacemaker   . Sinoatrial node dysfunction (H[facility])    s/p PPM  . Sleep apnea    compliant with CPAP  . Unspecified hypothyroidism   . Unspecified venous (peripheral) insufficiency     Past Surgical History:  Procedure Laterality Date  . CATARACT EXTRACTION W/PHACO Left 09/09/2014   Procedure: CATARACT EXTRACTION PHACO AND INTRAOCULAR LENS PLACEMENT (IOC);  Surgeon: Tonny Branch, MD;  Location: [facility];  Service: Ophthalmology;  Laterality: Left;  CDE: 6.99  . CATARACT EXTRACTION W/PHACO Right 10/07/2014   Procedure: CATARACT EXTRACTION PHACO AND INTRAOCULAR LENS PLACEMENT RIGHT EYE;  Surgeon: Tonny Branch, MD;  Location: [facility];  Service: Ophthalmology;  Laterality: Right;   CDE:5.16  . PACEMAKER INSERTION     SJM by JA    Review of Systems  Constitutional: Positive for fever. Negative for activity change and fatigue.  HENT: Negative.   Eyes: Negative.   Respiratory: Positive for shortness of breath. Negative for cough.   Cardiovascular: Positive for leg swelling. Negative for chest pain and palpitations.  Gastrointestinal: Negative.  Negative for abdominal pain.  Endocrine: Negative.   Genitourinary: Negative.  Negative for dysuria.  Musculoskeletal: Positive for arthralgias and back pain.  Skin: Positive for rash.  Neurological: Negative.     Allergies as of 05/11/2017   No Known Allergies     Medication List        Accurate as of 05/11/17 11:59 PM. Always use your most recent med list.          acetaminophen 500 MG tablet Commonly known as:  TYLENOL Take 500 mg by mouth every 6 (six) hours as needed for mild pain or moderate pain.   ALPRAZolam 0.25 MG tablet Commonly known as:  XANAX Take 0.25 mg by mouth at bedtime as needed for anxiety or sleep.   atorvastatin 80 MG tablet Commonly known as:  LIPITOR Take 1 tablet (80 mg total) by mouth daily.   clotrimazole-betamethasone cream Commonly known as:  LOTRISONE Apply 1 application topically daily as needed (for affected area(S)).   diltiazem 240 MG 24 hr capsule Commonly  known as:  CARDIZEM CD Take 1 capsule (240 mg total) by mouth daily.   esomeprazole 40 MG capsule Commonly known as:  NEXIUM Take 40 mg by mouth daily as needed (acid reflux).   hydrochlorothiazide 25 MG tablet Commonly known as:  HYDRODIURIL Take 1 tablet (25 mg total) by mouth daily.   levothyroxine 150 MCG tablet Commonly known as:  SYNTHROID, LEVOTHROID Take 1 tablet (150 mcg total) by mouth every morning.   metoprolol tartrate 100 MG tablet Commonly known as:  LOPRESSOR TAKE ONE-HALF TABLET BY MOUTH TWICE DAILY   silver sulfADIAZINE 1 % cream Commonly known as:  SILVADENE Apply 1 application  topically daily.   Vitamin D (Ergocalciferol) 50000 units Caps capsule Commonly known as:  DRISDOL Take 1 capsule (50,000 Units total) by mouth every 7 (seven) days. Takes on Thursdays.   warfarin 3 MG tablet Commonly known as:  COUMADIN Take as directed by the anticoagulation clinic. If you are unsure how to take this medication, talk to your nurse or doctor. Original instructions:  TAKE 1 TABLET BY MOUTH DAILY EXCEPT TAKE 1 AND 1/2 (ONE-HALF) TABLETS ON SUNDAYS, TUESDAYS AND THURSDAYS          Objective:    BP 139/82   Pulse 90   Temp (!) 97.3 F (36.3 C) (Oral)   Ht 5' (1.524 m)   Wt 287 lb 6.4 oz (130.4 kg)   BMI 56.13 kg/m   No Known Allergies  Physical Exam  Constitutional: She is oriented to person, place, and time. She appears well-developed and well-nourished.  HENT:  Head: Normocephalic and atraumatic.  Right Ear: Tympanic membrane, external ear and ear canal normal.  Left Ear: Tympanic membrane, external ear and ear canal normal.  Nose: Nose normal. No rhinorrhea.  Mouth/Throat: Oropharynx is clear and moist and mucous membranes are normal. No oropharyngeal exudate or posterior oropharyngeal erythema.  Eyes: Conjunctivae and EOM are normal. Pupils are equal, round, and reactive to light.  Neck: Normal range of motion. Neck supple.  Cardiovascular: Normal rate, regular rhythm, normal heart sounds and intact distal pulses.  Pulmonary/Chest: Effort normal and breath sounds normal.  Abdominal: Soft. Bowel sounds are normal.  Neurological: She is alert and oriented to person, place, and time. She has normal reflexes.  Skin: Skin is warm and dry. No rash noted.  Psychiatric: She has a normal mood and affect. Her behavior is normal. Judgment and thought content normal.    Results for orders placed or performed in visit on 04/28/17  POCT INR  Result Value Ref Range   INR 2.6       Assessment & Plan:   1. Lymphedema Continue wound care and therapy  2.  Hyperlipidemia associated with type 2 diabetes mellitus ([place])  3. Intertrigo    Current Outpatient Medications:  .  acetaminophen (TYLENOL) 500 MG tablet, Take 500 mg by mouth every 6 (six) hours as needed for mild pain or moderate pain., Disp: , Rfl:  .  ALPRAZolam (XANAX) 0.25 MG tablet, Take 0.25 mg by mouth at bedtime as needed for anxiety or sleep. , Disp: , Rfl:  .  atorvastatin (LIPITOR) 80 MG tablet, Take 1 tablet (80 mg total) by mouth daily., Disp: 90 tablet, Rfl: 3 .  clotrimazole-betamethasone (LOTRISONE) cream, Apply 1 application topically daily as needed (for affected area(S))., Disp: , Rfl:  .  diltiazem (CARDIZEM CD) 240 MG 24 hr capsule, Take 1 capsule (240 mg total) by mouth daily., Disp: 90 capsule, Rfl: 3 .  esomeprazole (NEXIUM) 40 MG capsule, Take 40 mg by mouth daily as needed (acid reflux). , Disp: , Rfl:  .  hydrochlorothiazide (HYDRODIURIL) 25 MG tablet, Take 1 tablet (25 mg total) by mouth daily., Disp: 30 tablet, Rfl: 0 .  levothyroxine (SYNTHROID, LEVOTHROID) 150 MCG tablet, Take 1 tablet (150 mcg total) by mouth every morning., Disp: 90 tablet, Rfl: 3 .  metoprolol tartrate (LOPRESSOR) 100 MG tablet, TAKE ONE-HALF TABLET BY MOUTH TWICE DAILY, Disp: 90 tablet, Rfl: 3 .  silver sulfADIAZINE (SILVADENE) 1 % cream, Apply 1 application topically daily., Disp: 50 g, Rfl: 0 .  Vitamin D, Ergocalciferol, (DRISDOL) 50000 units CAPS capsule, Take 1 capsule (50,000 Units total) by mouth every 7 (seven) days. Takes on Thursdays., Disp: 12 capsule, Rfl: 3 .  warfarin (COUMADIN) 3 MG tablet, TAKE 1 TABLET BY MOUTH DAILY EXCEPT TAKE 1 AND 1/2 (ONE-HALF) TABLETS ON SUNDAYS, TUESDAYS AND THURSDAYS, Disp: 45 tablet, Rfl: 4 Continue all other maintenance medications as listed above.  Follow up plan: No Follow-up on file.  Educational handout given for [place] PA-C [employer] [address]  [place], [place]  [address] [phone]   05/15/2017, 5:27 PM

## 2017-05-16 ENCOUNTER — Telehealth (HOSPITAL_COMMUNITY): Payer: Self-pay | Admitting: Physical Therapy

## 2017-05-16 ENCOUNTER — Encounter (HOSPITAL_COMMUNITY): Payer: Medicare Other | Admitting: Physical Therapy

## 2017-05-16 NOTE — Telephone Encounter (Signed)
Pt here today meeting with [facility] for compression garments.  Able to fit with Sigvaris compreflex lite for distal LE's.  Therapist changed wound dressing as soaked through while she was here but unable to thoroughly cleanse/irrigate or debride wound.  No charge for today's visit.  Pt continue woundcare 2X week Teena Irani, PTA/CLT [phone]

## 2017-05-18 ENCOUNTER — Encounter (HOSPITAL_COMMUNITY): Payer: Medicare Other | Admitting: Physical Therapy

## 2017-05-20 ENCOUNTER — Ambulatory Visit (HOSPITAL_COMMUNITY): Payer: Medicare Other | Admitting: Physical Therapy

## 2017-05-20 DIAGNOSIS — S81802D Unspecified open wound, left lower leg, subsequent encounter: Secondary | ICD-10-CM | POA: Diagnosis not present

## 2017-05-20 DIAGNOSIS — I890 Lymphedema, not elsewhere classified: Secondary | ICD-10-CM

## 2017-05-20 DIAGNOSIS — M79605 Pain in left leg: Secondary | ICD-10-CM | POA: Diagnosis not present

## 2017-05-20 DIAGNOSIS — R262 Difficulty in walking, not elsewhere classified: Secondary | ICD-10-CM | POA: Diagnosis not present

## 2017-05-20 NOTE — Therapy (Signed)
[facility] [place], [place], [address] Phone: [phone]   Fax:  [phone]  Wound Care Therapy  Patient Details  Name: Megan [name] MRN: [id number] Date of Birth: Aug 23, 1945 Referring Provider: Particia Nearing   Encounter Date: 05/20/2017  PT End of Session - 05/20/17 1211    Visit Number  25    Number of Visits  27    Date for PT Re-Evaluation  06/08/17    Authorization Type  Medicare    Authorization - Visit Number  25    Authorization - Number of Visits  27    PT Start Time  8546    PT Stop Time  1105    PT Time Calculation (min)  30 min    Equipment Utilized During Treatment  Gait belt    Activity Tolerance  Patient tolerated treatment well    Behavior During Therapy  WFL for tasks assessed/performed       Past Medical History:  Diagnosis Date  . Acute diastolic heart failure ([place])   . Anxiety state, unspecified   . Congestive heart failure, unspecified   . Degenerative joint disease   . GERD (gastroesophageal reflux disease)   . Hyperlipidemia   . Hypertension   . Lymphedema   . Obesity   . Persistent atrial fibrillation ([facility])     post-termination pauses with afib s/p PPM  . Presence of permanent cardiac pacemaker   . Sinoatrial node dysfunction ([facility])    s/p PPM  . Sleep apnea    compliant with CPAP  . Unspecified hypothyroidism   . Unspecified venous (peripheral) insufficiency     Past Surgical History:  Procedure Laterality Date  . CATARACT EXTRACTION W/PHACO Left 09/09/2014   Procedure: CATARACT EXTRACTION PHACO AND INTRAOCULAR LENS PLACEMENT (IOC);  Surgeon: Tonny Branch, MD;  Location: [facility];  Service: Ophthalmology;  Laterality: Left;  CDE: 6.99  . CATARACT EXTRACTION W/PHACO Right 10/07/2014   Procedure: CATARACT EXTRACTION PHACO AND INTRAOCULAR LENS PLACEMENT RIGHT EYE;  Surgeon: Tonny Branch, MD;  Location: [facility];  Service: Ophthalmology;  Laterality: Right;  CDE:5.16  . PACEMAKER INSERTION      SJM by JA    There were no vitals filed for this visit.   Subjective Assessment - 05/20/17 1157    Subjective  Pt states she was unable to wear the compression sock that came to wear under her garment and called Brandy at [facility].  STates she is suppose to mail her out something else to try.  Pt states it was too tight and pressing on her wound.  Pt verbalizes concern over the compression pushing the fluid up and making her "globule" bigger.    Currently in Pain?  No/denies                Wound Therapy - 05/20/17 1159    Subjective  Pt reports the wound is not hurting.  Wearing her compression but not the initial layer.  Pt wanting to find compression for the top of her Lt LE.  States she has a pump somewhere but doesnt know where it is.      Pain Score  0-No pain    Wound Properties Date First Assessed: 04/10/17 Time First Assessed: 1014 Wound Type: Other (Comment) Location: Leg Location Orientation: Left   Dressing Type  Compression wrap;Gauze (Comment) medihoney    Dressing Changed  Changed    Dressing Status  New drainage    Dressing Change Frequency  PRN    Site / Wound Assessment  Bleeding;Clean    % Wound base Red or Granulating  100%    % Wound base Yellow/Fibrinous Exudate  0%    % Wound base Black/Eschar  0%    % Wound base Other/Granulation Tissue (Comment)  0%    Peri-wound Assessment  Edema;Induration    Wound Length (cm)  0.5 cm    Wound Width (cm)  0.5 cm    Wound Depth (cm)  1.2 cm    Wound Volume (cm^3)  0.3 cm^3    Wound Surface Area (cm^2)  0.25 cm^2    Tunneling (cm)  1.5    Margins  Epibole (rolled edges)    Drainage Amount  Moderate    Drainage Description  Serosanguineous    Treatment  Hydrotherapy (Pulse lavage)    Pulsed lavage therapy - wound location  Lt ankle    Pulsed Lavage with Suction (psi)  4 psi    Pulsed Lavage with Suction - Normal Saline Used  1000 mL    Pulsed Lavage Tip  Tip with splash shield    Wound Therapy - Clinical  Statement  Pt has not completed self massage or utilized her pump at home to help with her edema in proximal LE's.  Educated pateint on importance of doing this.  Encouraged pateint to seek nutritional counseling as if she decreased her weight this would also improve her continition and possibly be able to wear compression better for her proximal limbs.  Also encoruaged patient to return to bed for sleeping instead of her recliner.  wound remeasured with slight decrease in size.  Drainage also decreased with less induration and no erythema present.     Wound Plan  continue with woundcare with appropriate dressings.  Inquire if she is using her pump or completing self massage.      Dressing   medihoney gauze packed into wound with 2X2 medipore over, multilayer short stretch and 1/2" foam              PT Education - 05/20/17 1207    Education provided  Yes    Education Details  Educated pateint on importance of utilizing compression pump or completing self massage.  Encouraged pateint to seek nutritional counseling as if she decreased her weight this would also improve her condition and possibly be able to wear compression better for her proximal limbs.  Also encoruaged patient to return to bed for sleeping instead of her recliner.      Person(s) Educated  Patient    Methods  Explanation    Comprehension  Verbalized understanding       PT Short Term Goals - 05/11/17 1026      PT SHORT TERM GOAL #1   Title  Pt Lt LE volumes to be decreased by 3 cm to reduce risk of wounds    Time  4    Period  Weeks    Status  Achieved      PT SHORT TERM GOAL #2   Title  PT Lt LE to no longer have heat or redness for evidence of decreased cellulitis.     Time  4    Period  Weeks    Status  Achieved      PT SHORT TERM GOAL #3   Title  PT to state that she has called [facility] to acquire new juxtafit garment.     Time  4    Period  Weeks  Status  Achieved      PT SHORT TERM GOAL #4   Title   PT to be ambulating with a rolling walker in order to reduce risk of falling     Baseline  Now that pain and volume has decreased pt is able to walk safely with her cane     Time  2    Period  Weeks    Status  Achieved        PT Long Term Goals - 05/13/17 1215      PT LONG TERM GOAL #1   Title  PT LT LE volume to be decreased by 6 cm to decrease pain to no greater than a 2/10 to allow pt to stand for 5-10 minutes without having to sit down to make a small meal.     Time  8    Period  Weeks    Status  Achieved in areas       PT LONG TERM GOAL #2   Title  Pt to be walking with a rolling walker for a 30 minutes at a time to improve lymph circulation.     Baseline  revised to cane:  pt is not walking encouraged pt to do so.     Time  8    Period  Weeks    Status  On-going      PT LONG TERM GOAL #3   Title  Pt to have acquired  new juxtafit to ensure proper compression to prevent future exacerbation of lymphedema.     Time  8    Period  Weeks    Status  On-going      PT LONG TERM GOAL #4   Title  Wound on Left ankle to depth to be .5 cm with no tunneling to allow pt to be confident in self care.     Time  6    Period  Weeks    Target Date  06/24/17      PT LONG TERM GOAL #5   Title  PT to be confident and completing self massage techniques for lymph drainage.     Time  6    Period  Weeks              Patient will benefit from skilled therapeutic intervention in order to improve the following deficits and impairments:     Visit Diagnosis: Wound of left lower extremity, subsequent encounter  Lymphedema, not elsewhere classified     Problem List Patient Active Problem List   Diagnosis Date Noted  . Intertrigo 05/11/2017  . OAB (overactive bladder) 02/22/2017  . Lymphedema 01/26/2017  . Gastroesophageal reflux disease without esophagitis 01/26/2017  . Acquired hypothyroidism 01/26/2017  . Mixed hyperlipidemia 01/26/2017  . Pyrexia   . Sleeps in sitting  position due to orthopnea 07/31/2015  . Acute on chronic systolic congestive heart failure ([place]) 06/09/2015  . Morbid obesity due to excess calories ([place]) 06/09/2015  . PVD (peripheral vascular disease) with claudication ([place]) 06/09/2015  . OSA (obstructive sleep apnea) 06/09/2015  . Long term (current) use of anticoagulants 06/19/2010  . Tachycardia-bradycardia syndrome ([place]) 11/18/2008  . PPM-[place] 11/18/2008  . Hyperlipidemia associated with type 2 diabetes mellitus ([place]) 02/21/2008  . HYPERTENSION, BENIGN 02/21/2008  . ATRIAL FIBRILLATION 02/21/2008   Teena Irani, PTA/CLT [phone]  Teena Irani 05/20/2017, 12:14 PM  [facility] [address] [place], [place], [address] Phone: [phone]   Fax:  [phone]  Name: Megan [name] MRN: [id number] Date of Birth: [birth year]

## 2017-05-23 ENCOUNTER — Encounter (HOSPITAL_COMMUNITY): Payer: Medicare Other | Admitting: Physical Therapy

## 2017-05-24 ENCOUNTER — Other Ambulatory Visit: Payer: Self-pay

## 2017-05-24 ENCOUNTER — Encounter (HOSPITAL_COMMUNITY): Payer: Self-pay | Admitting: Physical Therapy

## 2017-05-24 ENCOUNTER — Ambulatory Visit (HOSPITAL_COMMUNITY): Payer: Medicare Other | Admitting: Physical Therapy

## 2017-05-24 DIAGNOSIS — I890 Lymphedema, not elsewhere classified: Secondary | ICD-10-CM

## 2017-05-24 DIAGNOSIS — S81802D Unspecified open wound, left lower leg, subsequent encounter: Secondary | ICD-10-CM

## 2017-05-24 DIAGNOSIS — R262 Difficulty in walking, not elsewhere classified: Secondary | ICD-10-CM | POA: Diagnosis not present

## 2017-05-24 DIAGNOSIS — M79605 Pain in left leg: Secondary | ICD-10-CM | POA: Diagnosis not present

## 2017-05-24 NOTE — Therapy (Signed)
[place], [place], [address] Phone: [phone]   Fax:  [phone]  Wound Care Therapy  Patient Details  Name: Megan [name] MRN: [id number] Date of Birth: 29-Jan-1946 Referring Provider: Particia Nearing   Encounter Date: 05/24/2017  PT End of Session - 05/24/17 1015    Visit Number  26    Number of Visits  38    Date for PT Re-Evaluation  06/08/17    Authorization Type  Medicare    Authorization - Visit Number  26    Authorization - Number of Visits  38    PT Start Time  0900    PT Stop Time  0950    PT Time Calculation (min)  50 min    Activity Tolerance  Patient tolerated treatment well    Behavior During Therapy  [facility] for tasks assessed/performed       Past Medical History:  Diagnosis Date  . Acute diastolic heart failure ([place])   . Anxiety state, unspecified   . Congestive heart failure, unspecified   . Degenerative joint disease   . GERD (gastroesophageal reflux disease)   . Hyperlipidemia   . Hypertension   . Lymphedema   . Obesity   . Persistent atrial fibrillation (HCC)     post-termination pauses with afib s/p PPM  . Presence of permanent cardiac pacemaker   . Sinoatrial node dysfunction (HCC)    s/p PPM  . Sleep apnea    compliant with CPAP  . Unspecified hypothyroidism   . Unspecified venous (peripheral) insufficiency     Past Surgical History:  Procedure Laterality Date  . CATARACT EXTRACTION W/PHACO Left 09/09/2014   Procedure: CATARACT EXTRACTION PHACO AND INTRAOCULAR LENS PLACEMENT (IOC);  Surgeon: Tonny Branch, MD;  Location: [facility];  Service: Ophthalmology;  Laterality: Left;  CDE: 6.99  . CATARACT EXTRACTION W/PHACO Right 10/07/2014   Procedure: CATARACT EXTRACTION PHACO AND INTRAOCULAR LENS PLACEMENT RIGHT EYE;  Surgeon: Tonny Branch, MD;  Location: [facility];  Service: Ophthalmology;  Laterality: Right;  CDE:5.16  . PACEMAKER INSERTION     SJM by JA    There were no vitals filed  for this visit.              Wound Therapy - 05/24/17 1003    Subjective  Pt states that she has not looked for her pump yet.  Pt reports increased swelling and discomfort in Lt LE>  Pt states that she can not wear the compression sock because it hurts.      Pain Score  0-No pain    Evaluation and Treatment Procedures Explained to Patient/Family  Yes    Evaluation and Treatment Procedures  agreed to    Wound Properties Date First Assessed: 04/10/17 Time First Assessed: 1324 Wound Type: Other (Comment) Location: Leg Location Orientation: Left   Dressing Type  Compression wrap;Gauze (Comment) medihoney    Dressing Changed  Changed    Dressing Status  New drainage    Dressing Change Frequency  PRN    Site / Wound Assessment  Bleeding;Clean    % Wound base Red or Granulating  100%    % Wound base Yellow/Fibrinous Exudate  0%    % Wound base Black/Eschar  0%    % Wound base Other/Granulation Tissue (Comment)  0%    Peri-wound Assessment  Edema;Induration    Wound Length (cm)  0.5 cm    Wound Width (cm)  0.6 cm    Wound  Depth (cm)  1.5 cm    Wound Volume (cm^3)  0.45 cm^3    Wound Surface Area (cm^2)  0.3 cm^2    Tunneling (cm)  inferiorly 1.2; other areas between .3 and .5     Margins  Epibole (rolled edges)    Drainage Amount  Minimal    Drainage Description  Serous    Treatment  Hydrotherapy (Pulse lavage);Debridement (Selective);Other (Comment) manual     Pulsed lavage therapy - wound location  Lt ankle    Pulsed Lavage with Suction (psi)  4 psi    Pulsed Lavage with Suction - Normal Saline Used  1000 mL    Pulsed Lavage Tip  Tip with splash shield    Selective Debridement - Location  wound edges as far as forceps could reach     Selective Debridement - Tools Used  Forceps    Selective Debridement - Tissue Removed  slough; epiboled skin    Wound Therapy - Clinical Statement  Pt wound is healing very slowly due to DM, lymphedema, obesity and pt decreased activity.  Due to  depth and tunneling needs skilled care to prevent infection  Pt foot has increased edema due to not being able to tolerate compression sock.  Pt states [employer] was suppose to be delivering a different compression garment for her foot but it has not arrived.  Pt has not looked for her pump at this time stating that she would not know what to do with it.  Therapist told pt to bring it in and we would demonstrate for her but she must be using the compression pump to assist in decongestion in her foot and thigh area.  Pt verbally stated that she understood.  Therapist urged pt to call [place] and request compression capri's as well.  Pt states part of the problem is that she is clausrophobic.      Factors Delaying/Impairing Wound Healing  Diabetes Mellitus;Multiple medical problems;Immobility;Vascular compromise    Hydrotherapy Plan  Debridement;Dressing change;Patient/family education;Pulsatile lavage with suction    Wound Therapy - Frequency  2X / week    Wound Therapy - Current Recommendations  PT    Wound Plan  Continue with both sharps and pulse lavage for wound cleansing.  Manual to LE especially inferior and posterior aspect of wound where induration continues to be present.     Dressing   medihoney gauze packed into wound with 2X2 medipore over, followed by compression garment.      Manual Therapy  decongestive techniques used on LT LE with special attention to inferiror aspect of wound and foot.               PT Education - 05/24/17 1014    Education provided  Yes    Education Details  [employer], [employer] about compression capri's     Person(s) Educated  Patient    Methods  Explanation    Comprehension  Verbalized understanding       PT Short Term Goals - 05/11/17 1026      PT SHORT TERM GOAL #1   Title  Pt Lt LE volumes to be decreased by 3 cm to reduce risk of wounds    Time  4    Period  Weeks    Status  Achieved      PT SHORT TERM GOAL #2   Title  PT Lt LE to  no longer have heat or redness for evidence of decreased cellulitis.     Time  4    Period  Weeks    Status  Achieved      PT SHORT TERM GOAL #3   Title  PT to state that she has called [employer] to acquire new juxtafit garment.     Time  4    Period  Weeks    Status  Achieved      PT SHORT TERM GOAL #4   Title  PT to be ambulating with a rolling walker in order to reduce risk of falling     Baseline  Now that pain and volume has decreased pt is able to walk safely with her cane     Time  2    Period  Weeks    Status  Achieved        PT Long Term Goals - 05/13/17 1215      PT LONG TERM GOAL #1   Title  PT LT LE volume to be decreased by 6 cm to decrease pain to no greater than a 2/10 to allow pt to stand for 5-10 minutes without having to sit down to make a small meal.     Time  8    Period  Weeks    Status  Achieved in areas       PT LONG TERM GOAL #2   Title  Pt to be walking with a rolling walker for a 30 minutes at a time to improve lymph circulation.     Baseline  revised to cane:  pt is not walking encouraged pt to do so.     Time  8    Period  Weeks    Status  On-going      PT LONG TERM GOAL #3   Title  Pt to have acquired  new juxtafit to ensure proper compression to prevent future exacerbation of lymphedema.     Time  8    Period  Weeks    Status  On-going      PT LONG TERM GOAL #4   Title  Wound on Left ankle to depth to be .5 cm with no tunneling to allow pt to be confident in self care.     Time  6    Period  Weeks    Target Date  06/24/17      PT LONG TERM GOAL #5   Title  PT to be confident and completing self massage techniques for lymph drainage.     Time  6    Period  Weeks            Plan - 05/24/17 1015    Clinical Impression Statement  See above     Rehab Potential  Good    PT Frequency  2x / week    PT Duration  Other (comment) 15 weeks (6 additional weeks )    PT Treatment/Interventions  ADLs/Self Care Home  Management;Patient/family education;Manual lymph drainage;Compression bandaging nonhealing wound     PT Next Visit Plan  Pt wound is healing very slowly due to DM, lymphedema, obesity and pt decreased activity.  Due to depth and tunneling needs skilled care to prevent infection . Continue with pulse lavage, sharps debridement and manua as needed.      PT Home Exercise Plan  lymphedema exercises     Consulted and Agree with Plan of Care  Patient       Patient will benefit from skilled therapeutic intervention in order to improve the following deficits and impairments:  Pain, Decreased strength, Obesity, Difficulty walking, Decreased activity tolerance, Increased edema(nonhealing wound )  Visit Diagnosis: Wound of left lower extremity, subsequent encounter  Lymphedema, not elsewhere classified  Pain in left leg     Problem List Patient Active Problem List   Diagnosis Date Noted  . Intertrigo 05/11/2017  . OAB (overactive bladder) 02/22/2017  . Lymphedema 01/26/2017  . Gastroesophageal reflux disease without esophagitis 01/26/2017  . Acquired hypothyroidism 01/26/2017  . Mixed hyperlipidemia 01/26/2017  . Pyrexia   . Sleeps in sitting position due to orthopnea 07/31/2015  . Acute on chronic systolic congestive heart failure ([place]) 06/09/2015  . Morbid obesity due to excess calories ([place]) 06/09/2015  . PVD (peripheral vascular disease) with claudication ([place]) 06/09/2015  . OSA (obstructive sleep apnea) 06/09/2015  . Long term (current) use of anticoagulants 06/19/2010  . Tachycardia-bradycardia syndrome ([place]) 11/18/2008  . PPM-St.Jude 11/18/2008  . Hyperlipidemia associated with type 2 diabetes mellitus ([place]) 02/21/2008  . HYPERTENSION, BENIGN 02/21/2008  . ATRIAL FIBRILLATION 02/21/2008    Rayetta Humphrey, PT CLT [phone] 05/24/2017, 10:20 AM  [place], [place], [address] Phone: [phone]   Fax:   [phone]  Name: Megan [name] MRN: [id number] Date of Birth: 1945/09/10

## 2017-05-25 ENCOUNTER — Encounter (HOSPITAL_COMMUNITY): Payer: Medicare Other | Admitting: Physical Therapy

## 2017-05-26 ENCOUNTER — Encounter (HOSPITAL_COMMUNITY): Payer: Self-pay | Admitting: Physical Therapy

## 2017-05-26 ENCOUNTER — Ambulatory Visit (INDEPENDENT_AMBULATORY_CARE_PROVIDER_SITE_OTHER): Payer: Medicare Other | Admitting: *Deleted

## 2017-05-26 ENCOUNTER — Other Ambulatory Visit: Payer: Self-pay

## 2017-05-26 ENCOUNTER — Ambulatory Visit (HOSPITAL_COMMUNITY): Payer: Medicare Other | Admitting: Physical Therapy

## 2017-05-26 DIAGNOSIS — R262 Difficulty in walking, not elsewhere classified: Secondary | ICD-10-CM | POA: Diagnosis not present

## 2017-05-26 DIAGNOSIS — I4891 Unspecified atrial fibrillation: Secondary | ICD-10-CM | POA: Diagnosis not present

## 2017-05-26 DIAGNOSIS — Z5181 Encounter for therapeutic drug level monitoring: Secondary | ICD-10-CM | POA: Diagnosis not present

## 2017-05-26 DIAGNOSIS — I890 Lymphedema, not elsewhere classified: Secondary | ICD-10-CM

## 2017-05-26 DIAGNOSIS — M79605 Pain in left leg: Secondary | ICD-10-CM | POA: Diagnosis not present

## 2017-05-26 DIAGNOSIS — S81802D Unspecified open wound, left lower leg, subsequent encounter: Secondary | ICD-10-CM

## 2017-05-26 LAB — POCT INR: INR: 3.3

## 2017-05-26 NOTE — Patient Instructions (Signed)
Take 1/2 tablet tonight then resume 1 tablet daily except 1 1/2 tablets on Sundays, Tuesdays and Thursdays Recheck 06/09/17

## 2017-05-26 NOTE — Therapy (Signed)
[place], [place], [address] Phone: [phone]   Fax:  [phone]  Wound Care Therapy  Patient Details  Name: Megan [name] MRN: [id number] Date of Birth: [birth year] Referring Provider: Particia Nearing   Encounter Date: 05/26/2017  PT End of Session - 05/26/17 1043    Visit Number  27    Number of Visits  38    Date for PT Re-Evaluation  06/08/17    Authorization Type  Medicare    Authorization - Visit Number  50    Authorization - Number of Visits  38    PT Start Time  0076    PT Stop Time  0950    PT Time Calculation (min)  55 min    Activity Tolerance  Patient tolerated treatment well    Behavior During Therapy  [facility] for tasks assessed/performed       Past Medical History:  Diagnosis Date  . Acute diastolic heart failure ([place])   . Anxiety state, unspecified   . Congestive heart failure, unspecified   . Degenerative joint disease   . GERD (gastroesophageal reflux disease)   . Hyperlipidemia   . Hypertension   . Lymphedema   . Obesity   . Persistent atrial fibrillation (HCC)     post-termination pauses with afib s/p PPM  . Presence of permanent cardiac pacemaker   . Sinoatrial node dysfunction (HCC)    s/p PPM  . Sleep apnea    compliant with CPAP  . Unspecified hypothyroidism   . Unspecified venous (peripheral) insufficiency     Past Surgical History:  Procedure Laterality Date  . CATARACT EXTRACTION W/PHACO Left 09/09/2014   Procedure: CATARACT EXTRACTION PHACO AND INTRAOCULAR LENS PLACEMENT (IOC);  Surgeon: Tonny Branch, MD;  Location: [facility];  Service: Ophthalmology;  Laterality: Left;  CDE: 6.99  . CATARACT EXTRACTION W/PHACO Right 10/07/2014   Procedure: CATARACT EXTRACTION PHACO AND INTRAOCULAR LENS PLACEMENT RIGHT EYE;  Surgeon: Tonny Branch, MD;  Location: [facility];  Service: Ophthalmology;  Laterality: Right;  CDE:5.16  . PACEMAKER INSERTION     SJM by JA    There were no vitals filed  for this visit.   Subjective Assessment - 05/26/17 0939    Subjective  Pt states she was unable to wear the compression sock that came to wear under her garment and called Brandy at [place].  STates she is suppose to mail her out something else to try.  Pt states it was too tight and pressing on her wound.  Pt verbalizes concern over the compression pushing the fluid up and making her "globule" bigger.                Wound Therapy - 05/26/17 0940    Subjective  PT brought compression pump with her this session.  States she is concerned about her compression garment due to the compression garment that she is currently  wearing is pushing fluid to the knee and foot.  Therapist urged pt to call [place].     Patient and Family Stated Goals  wound to heal     Pain Score  0-No pain    Evaluation and Treatment Procedures Explained to Patient/Family  Yes    Evaluation and Treatment Procedures  agreed to    Wound Properties Date First Assessed: 04/10/17 Time First Assessed: 1014 Wound Type: Other (Comment) Location: Leg Location Orientation: Left   Dressing Type  Compression wrap;Gauze (Comment) medihoney    Dressing  Status  New drainage    Dressing Change Frequency  PRN    Site / Wound Assessment  Bleeding;Clean    % Wound base Red or Granulating  100%    % Wound base Yellow/Fibrinous Exudate  0%    % Wound base Black/Eschar  0%    % Wound base Other/Granulation Tissue (Comment)  0%    Peri-wound Assessment  Edema;Induration    Margins  Epibole (rolled edges)    Drainage Amount  Minimal    Drainage Description  Serous    Pulsed lavage therapy - wound location  Lt ankle    Pulsed Lavage with Suction (psi)  4 psi    Pulsed Lavage with Suction - Normal Saline Used  1000 mL    Pulsed Lavage Tip  Tip with splash shield    Selective Debridement - Location  wound edges as far as forceps could reach     Selective Debridement - Tools Used  Forceps    Selective Debridement - Tissue Removed   slough; epiboled skin    Wound Therapy - Clinical Statement  Manual completed to Lt LE to decrease congestion at knee, inferior border of wound and foot.  Therapist  set compression pump up demonstrating to pt what needs to be done.  Pt does not feel comfortable doing by herself at this time.      Factors Delaying/Impairing Wound Healing  Diabetes Mellitus;Multiple medical problems;Immobility;Vascular compromise    Hydrotherapy Plan  Debridement;Dressing change;Patient/family education;Pulsatile lavage with suction    Wound Therapy - Frequency  2X / week    Wound Therapy - Current Recommendations  PT    Wound Plan  Do not do pulse lavage.  Only  dressing change.  Have pt set compression pump up herself and feel how the pump will work.  Therapist set pump up after visit and pump is working correctly.  Ultimately I would like pt to tolerate at the pressure pump is set at but if pt won't be compliant with this we can decrease the amount to pressure to 35 mmHG.       Dressing   medihoney gauze packed into wound with 2X2 medipore over, followed by compression garment.      Manual Therapy  decongestive techniques used on LT LE with special attention to inferiror aspect of wound and foot.                 PT Short Term Goals - 05/11/17 1026      PT SHORT TERM GOAL #1   Title  Pt Lt LE volumes to be decreased by 3 cm to reduce risk of wounds    Time  4    Period  Weeks    Status  Achieved      PT SHORT TERM GOAL #2   Title  PT Lt LE to no longer have heat or redness for evidence of decreased cellulitis.     Time  4    Period  Weeks    Status  Achieved      PT SHORT TERM GOAL #3   Title  PT to state that she has called [employer] to acquire new juxtafit garment.     Time  4    Period  Weeks    Status  Achieved      PT SHORT TERM GOAL #4   Title  PT to be ambulating with a rolling walker in order to reduce risk of falling     Baseline  Now that pain and volume has decreased pt is  able to walk safely with her cane     Time  2    Period  Weeks    Status  Achieved        PT Long Term Goals - 05/13/17 1215      PT LONG TERM GOAL #1   Title  PT LT LE volume to be decreased by 6 cm to decrease pain to no greater than a 2/10 to allow pt to stand for 5-10 minutes without having to sit down to make a small meal.     Time  8    Period  Weeks    Status  Achieved in areas       PT LONG TERM GOAL #2   Title  Pt to be walking with a rolling walker for a 30 minutes at a time to improve lymph circulation.     Baseline  revised to cane:  pt is not walking encouraged pt to do so.     Time  8    Period  Weeks    Status  On-going      PT LONG TERM GOAL #3   Title  Pt to have acquired  new juxtafit to ensure proper compression to prevent future exacerbation of lymphedema.     Time  8    Period  Weeks    Status  On-going      PT LONG TERM GOAL #4   Title  Wound on Left ankle to depth to be .5 cm with no tunneling to allow pt to be confident in self care.     Time  6    Period  Weeks    Target Date  06/24/17      PT LONG TERM GOAL #5   Title  PT to be confident and completing self massage techniques for lymph drainage.     Time  6    Period  Weeks            Plan - 05/26/17 1044    Rehab Potential  Good    PT Frequency  2x / week    PT Duration  Other (comment) 15 weeks (6 additional weeks )    PT Treatment/Interventions  ADLs/Self Care Home Management;Patient/family education;Manual lymph drainage;Compression bandaging nonhealing wound     PT Next Visit Plan  See above.  Use majority of session to get pt comfortable with I donning compression pump without anyone in the home.  May omit pulse lavage if this takes more time than expected.      PT Home Exercise Plan  lymphedema exercises     Consulted and Agree with Plan of Care  Patient       Patient will benefit from skilled therapeutic intervention in order to improve the following deficits and impairments:   Pain, Decreased strength, Obesity, Difficulty walking, Decreased activity tolerance, Increased edema(nonhealing wound )  Visit Diagnosis: Wound of left lower extremity, subsequent encounter  Lymphedema, not elsewhere classified     Problem List Patient Active Problem List   Diagnosis Date Noted  . Intertrigo 05/11/2017  . OAB (overactive bladder) 02/22/2017  . Lymphedema 01/26/2017  . Gastroesophageal reflux disease without esophagitis 01/26/2017  . Acquired hypothyroidism 01/26/2017  . Mixed hyperlipidemia 01/26/2017  . Pyrexia   . Sleeps in sitting position due to orthopnea 07/31/2015  . Acute on chronic systolic congestive heart failure ([place]) 06/09/2015  . Morbid obesity due to excess calories ([place])  06/09/2015  . PVD (peripheral vascular disease) with claudication ([place]) 06/09/2015  . OSA (obstructive sleep apnea) 06/09/2015  . Long term (current) use of anticoagulants 06/19/2010  . Tachycardia-bradycardia syndrome ([place]) 11/18/2008  . PPM-St.Jude 11/18/2008  . Hyperlipidemia associated with type 2 diabetes mellitus ([place]) 02/21/2008  . HYPERTENSION, BENIGN 02/21/2008  . ATRIAL FIBRILLATION 02/21/2008  Rayetta Humphrey, PT CLT [phone] 05/26/2017, 10:48 AM  [place] [address] [place], [place], [address] Phone: [phone]   Fax:  [phone]  Name: Megan [name] MRN: [id number] Date of Birth: 06-22-1945

## 2017-05-27 ENCOUNTER — Encounter (HOSPITAL_COMMUNITY): Payer: Medicare Other | Admitting: Physical Therapy

## 2017-05-31 ENCOUNTER — Ambulatory Visit (HOSPITAL_COMMUNITY): Payer: Medicare Other | Attending: Family Medicine | Admitting: Physical Therapy

## 2017-05-31 ENCOUNTER — Encounter (HOSPITAL_COMMUNITY): Payer: Self-pay | Admitting: Physical Therapy

## 2017-05-31 ENCOUNTER — Other Ambulatory Visit: Payer: Self-pay

## 2017-05-31 DIAGNOSIS — S81802D Unspecified open wound, left lower leg, subsequent encounter: Secondary | ICD-10-CM | POA: Diagnosis not present

## 2017-05-31 DIAGNOSIS — I890 Lymphedema, not elsewhere classified: Secondary | ICD-10-CM | POA: Diagnosis not present

## 2017-05-31 DIAGNOSIS — X58XXXD Exposure to other specified factors, subsequent encounter: Secondary | ICD-10-CM | POA: Diagnosis not present

## 2017-05-31 NOTE — Therapy (Signed)
[place], [place], [address] Phone: [phone]   Fax:  [phone]  Wound Care Therapy  Patient Details  Name: Megan [name] MRN: [id number] Date of Birth: 10/23/1945 Referring Provider: Particia Nearing   Encounter Date: 05/31/2017  PT End of Session - 05/31/17 1056    Visit Number  28    Number of Visits  38    Date for PT Re-Evaluation  06/08/17    Authorization Type  Medicare    Authorization - Visit Number  28    Authorization - Number of Visits  38    PT Start Time  1030    PT Stop Time  1115    PT Time Calculation (min)  45 min    Activity Tolerance  Patient tolerated treatment well    Behavior During Therapy  [facility] for tasks assessed/performed       Past Medical History:  Diagnosis Date  . Acute diastolic heart failure ([place])   . Anxiety state, unspecified   . Congestive heart failure, unspecified   . Degenerative joint disease   . GERD (gastroesophageal reflux disease)   . Hyperlipidemia   . Hypertension   . Lymphedema   . Obesity   . Persistent atrial fibrillation (HCC)     post-termination pauses with afib s/p PPM  . Presence of permanent cardiac pacemaker   . Sinoatrial node dysfunction (HCC)    s/p PPM  . Sleep apnea    compliant with CPAP  . Unspecified hypothyroidism   . Unspecified venous (peripheral) insufficiency     Past Surgical History:  Procedure Laterality Date  . CATARACT EXTRACTION W/PHACO Left 09/09/2014   Procedure: CATARACT EXTRACTION PHACO AND INTRAOCULAR LENS PLACEMENT (IOC);  Surgeon: Tonny Branch, MD;  Location: [facility];  Service: Ophthalmology;  Laterality: Left;  CDE: 6.99  . CATARACT EXTRACTION W/PHACO Right 10/07/2014   Procedure: CATARACT EXTRACTION PHACO AND INTRAOCULAR LENS PLACEMENT RIGHT EYE;  Surgeon: Tonny Branch, MD;  Location: [facility];  Service: Ophthalmology;  Laterality: Right;  CDE:5.16  . PACEMAKER INSERTION     SJM by JA    There were no vitals filed for  this visit.   Subjective Assessment - 05/31/17 1047    Subjective  PT states that [facility] sent a new foot compression but she is uable to tolerate this as well.  Pt has brought sock which is at 15 mmhg.  Therapist encouraged pt to try and tolerate sock.      Pertinent History  lymphedema, HTN, AFib CHF  obesity,     Limitations  Standing;Walking    How long can you sit comfortably?  no  problem    How long can you stand comfortably?  no longer than a minute without support.    How long can you walk comfortably?  a couple of minutes with assistive device     Pain Onset  1 to 4 weeks ago                Wound Therapy - 05/31/17 1050    Subjective  PT voiced that new compression sock sent to her which is only 15 mm hg is still uncomfortable.    Patient and Family Stated Goals  wound to heal     Pain Assessment  No/denies pain states that pain comes and goes. Highest 6/10     Evaluation and Treatment Procedures Explained to Patient/Family  Yes    Evaluation and Treatment Procedures  agreed to    Wound Properties Date First Assessed: 04/10/17 Time First Assessed: 1014 Wound Type: Other (Comment) Location: Leg Location Orientation: Left   Dressing Type  Compression wrap;Gauze (Comment) medihoney    Dressing Changed  Changed    Dressing Status  New drainage    Dressing Change Frequency  PRN    Site / Wound Assessment  Bleeding;Clean    % Wound base Red or Granulating  100%    % Wound base Yellow/Fibrinous Exudate  0%    % Wound base Black/Eschar  0%    % Wound base Other/Granulation Tissue (Comment)  0%    Peri-wound Assessment  Edema;Induration    Wound Length (cm)  0.6 cm    Wound Width (cm)  0.6 cm    Wound Depth (cm)  1.8 cm    Wound Volume (cm^3)  0.65 cm^3    Wound Surface Area (cm^2)  0.36 cm^2    Margins  Epibole (rolled edges)    Drainage Amount  Minimal    Drainage Description  Serous    Treatment  Hydrotherapy (Pulse lavage);Other (Comment) packed with gauze  moistened with medihoney    Pulsed lavage therapy - wound location  Lt ankle    Pulsed Lavage with Suction (psi)  4 psi    Pulsed Lavage with Suction - Normal Saline Used  1000 mL    Pulsed Lavage Tip  Tip with splash shield    Selective Debridement - Location  wound edges as far as forceps could reach     Selective Debridement - Tools Used  Forceps    Selective Debridement - Tissue Removed  slough; epiboled skin    Wound Therapy - Clinical Statement  PT instructed how to don and doff compression pump.  Pt had difficulty but was able to complete I.  Therapist stayed with pt for 15 minutes while pump was completing compression cycles for 15 minutes.   PT had no complaints. Therapist donned compression sock ,(new one with 15 mmhg ) pt voiced no complaint stating that it felt good.   Pt was given pump back and urged to use for an hour everyday.      Factors Delaying/Impairing Wound Healing  Diabetes Mellitus;Multiple medical problems;Immobility;Vascular compromise    Hydrotherapy Plan  Debridement;Dressing change;Patient/family education;Pulsatile lavage with suction    Wound Therapy - Frequency  2X / week    Wound Therapy - Current Recommendations  PT    Wound Plan  Call MD re possible wound center referral for nitro stick to epiboled edges of wound and possible wound vac referra;/      Dressing   medihoney gauze packed into wound with 2X2 medipore over, followed by compression garment.        [place] Adult PT Treatment/Exercise - 05/31/17 0001      Self-Care   Self-Care  Other Self-Care Comments Pt instructed on donning and doffing compression pump.                 PT Short Term Goals - 05/11/17 1026      PT SHORT TERM GOAL #1   Title  Pt Lt LE volumes to be decreased by 3 cm to reduce risk of wounds    Time  4    Period  Weeks    Status  Achieved      PT SHORT TERM GOAL #2   Title  PT Lt LE to no longer have heat or redness for evidence of decreased cellulitis.  Time  4     Period  Weeks    Status  Achieved      PT SHORT TERM GOAL #3   Title  PT to state that she has called [employer] to acquire new juxtafit garment.     Time  4    Period  Weeks    Status  Achieved      PT SHORT TERM GOAL #4   Title  PT to be ambulating with a rolling walker in order to reduce risk of falling     Baseline  Now that pain and volume has decreased pt is able to walk safely with her cane     Time  2    Period  Weeks    Status  Achieved        PT Long Term Goals - 05/13/17 1215      PT LONG TERM GOAL #1   Title  PT LT LE volume to be decreased by 6 cm to decrease pain to no greater than a 2/10 to allow pt to stand for 5-10 minutes without having to sit down to make a small meal.     Time  8    Period  Weeks    Status  Achieved in areas       PT LONG TERM GOAL #2   Title  Pt to be walking with a rolling walker for a 30 minutes at a time to improve lymph circulation.     Baseline  revised to cane:  pt is not walking encouraged pt to do so.     Time  8    Period  Weeks    Status  On-going      PT LONG TERM GOAL #3   Title  Pt to have acquired  new juxtafit to ensure proper compression to prevent future exacerbation of lymphedema.     Time  8    Period  Weeks    Status  On-going      PT LONG TERM GOAL #4   Title  Wound on Left ankle to depth to be .5 cm with no tunneling to allow pt to be confident in self care.     Time  6    Period  Weeks    Target Date  06/24/17      PT LONG TERM GOAL #5   Title  PT to be confident and completing self massage techniques for lymph drainage.     Time  6    Period  Weeks            Plan - 05/31/17 1057    Clinical Impression Statement  see above     Rehab Potential  Good    PT Frequency  2x / week    PT Duration  Other (comment) 15 weeks (6 additional weeks )    PT Treatment/Interventions  ADLs/Self Care Home Management;Patient/family education;Manual lymph drainage;Compression bandaging nonhealing wound     PT  Next Visit Plan  answer any questions on compression pump.  Continue with wound care to Lt LE>      PT Home Exercise Plan  lymphedema exercises     Consulted and Agree with Plan of Care  Patient       Patient will benefit from skilled therapeutic intervention in order to improve the following deficits and impairments:  Pain, Decreased strength, Obesity, Difficulty walking, Decreased activity tolerance, Increased edema(nonhealing wound )  Visit Diagnosis: Wound of left lower extremity,  subsequent encounter  Lymphedema, not elsewhere classified     Problem List Patient Active Problem List   Diagnosis Date Noted  . Intertrigo 05/11/2017  . OAB (overactive bladder) 02/22/2017  . Lymphedema 01/26/2017  . Gastroesophageal reflux disease without esophagitis 01/26/2017  . Acquired hypothyroidism 01/26/2017  . Mixed hyperlipidemia 01/26/2017  . Pyrexia   . Sleeps in sitting position due to orthopnea 07/31/2015  . Acute on chronic systolic congestive heart failure ([place]) 06/09/2015  . Morbid obesity due to excess calories ([place]) 06/09/2015  . PVD (peripheral vascular disease) with claudication ([place]) 06/09/2015  . OSA (obstructive sleep apnea) 06/09/2015  . Long term (current) use of anticoagulants 06/19/2010  . Tachycardia-bradycardia syndrome ([place]) 11/18/2008  . PPM-St.Jude 11/18/2008  . Hyperlipidemia associated with type 2 diabetes mellitus ([place]) 02/21/2008  . HYPERTENSION, BENIGN 02/21/2008  . ATRIAL FIBRILLATION 02/21/2008    Rayetta Humphrey, PT CLT [phone] 05/31/2017, 12:07 PM  [place] [address] [place], [place], [address] Phone: [phone]   Fax:  [phone]  Name: Megan [name] MRN: [id number] Date of Birth: 1945/05/21

## 2017-06-01 ENCOUNTER — Other Ambulatory Visit: Payer: Self-pay | Admitting: *Deleted

## 2017-06-01 ENCOUNTER — Encounter (HOSPITAL_COMMUNITY): Payer: Medicare Other | Admitting: Physical Therapy

## 2017-06-01 ENCOUNTER — Telehealth: Payer: Self-pay | Admitting: Physician Assistant

## 2017-06-01 DIAGNOSIS — L03116 Cellulitis of left lower limb: Secondary | ICD-10-CM

## 2017-06-01 NOTE — Telephone Encounter (Signed)
Referral has been sent in.

## 2017-06-01 NOTE — Telephone Encounter (Signed)
Please review and advise.

## 2017-06-01 NOTE — Telephone Encounter (Signed)
Called MD office about possible referral of pt to the wound center in [facility] for nitro stick to epiboled edges as well as a possible wound vac referral.  MD and nurse are not in today but receptionist left a message.   Megan [name], [place] CLT [phone]

## 2017-06-01 NOTE — Telephone Encounter (Signed)
Please refer as requested 

## 2017-06-02 ENCOUNTER — Ambulatory Visit (HOSPITAL_COMMUNITY): Payer: Medicare Other | Admitting: Physical Therapy

## 2017-06-02 DIAGNOSIS — I890 Lymphedema, not elsewhere classified: Secondary | ICD-10-CM | POA: Diagnosis not present

## 2017-06-02 DIAGNOSIS — S81802D Unspecified open wound, left lower leg, subsequent encounter: Secondary | ICD-10-CM

## 2017-06-02 NOTE — Therapy (Signed)
[place], [place], [address] Phone: [phone]   Fax:  [phone]  Wound Care Therapy  Patient Details  Name: Megan [name] MRN: [id number] Date of Birth: January 27, 1946 Referring Provider: Particia Nearing   Encounter Date: 06/02/2017  PT End of Session - 06/02/17 1100    Visit Number  29    Number of Visits  38    Date for PT Re-Evaluation  06/08/17    Authorization Type  Medicare    Authorization - Visit Number  10    Authorization - Number of Visits  38    PT Start Time  0900    PT Stop Time  0940    PT Time Calculation (min)  40 min    Activity Tolerance  Patient tolerated treatment well    Behavior During Therapy  [facility] for tasks assessed/performed       Past Medical History:  Diagnosis Date  . Acute diastolic heart failure ([place])   . Anxiety state, unspecified   . Congestive heart failure, unspecified   . Degenerative joint disease   . GERD (gastroesophageal reflux disease)   . Hyperlipidemia   . Hypertension   . Lymphedema   . Obesity   . Persistent atrial fibrillation (HCC)     post-termination pauses with afib s/p PPM  . Presence of permanent cardiac pacemaker   . Sinoatrial node dysfunction (HCC)    s/p PPM  . Sleep apnea    compliant with CPAP  . Unspecified hypothyroidism   . Unspecified venous (peripheral) insufficiency     Past Surgical History:  Procedure Laterality Date  . CATARACT EXTRACTION W/PHACO Left 09/09/2014   Procedure: CATARACT EXTRACTION PHACO AND INTRAOCULAR LENS PLACEMENT (IOC);  Surgeon: Tonny Branch, MD;  Location: [facility];  Service: Ophthalmology;  Laterality: Left;  CDE: 6.99  . CATARACT EXTRACTION W/PHACO Right 10/07/2014   Procedure: CATARACT EXTRACTION PHACO AND INTRAOCULAR LENS PLACEMENT RIGHT EYE;  Surgeon: Tonny Branch, MD;  Location: [facility];  Service: Ophthalmology;  Laterality: Right;  CDE:5.16  . PACEMAKER INSERTION     SJM by JA    There were no vitals filed for  this visit.              Wound Therapy - 06/02/17 1005    Subjective  pt comes today with juxtas in place but without compression socks.  STates they are all just too uncomfortable but knows she needs to wear them.    Patient and Family Stated Goals  wound to heal     Pain Assessment  No/denies pain    Evaluation and Treatment Procedures Explained to Patient/Family  Yes    Evaluation and Treatment Procedures  agreed to    Wound Properties Date First Assessed: 04/10/17 Time First Assessed: 1014 Wound Type: Other (Comment) Location: Leg Location Orientation: Left   Dressing Type  Compression wrap;Gauze (Comment) medihoney    Dressing Changed  Changed    Dressing Status  New drainage    Dressing Change Frequency  PRN    Site / Wound Assessment  Bleeding;Clean    % Wound base Red or Granulating  100%    % Wound base Yellow/Fibrinous Exudate  0%    % Wound base Black/Eschar  0%    % Wound base Other/Granulation Tissue (Comment)  0%    Peri-wound Assessment  Edema;Induration    Margins  Epibole (rolled edges)    Drainage Amount  Moderate    Drainage Description  Serous    Treatment  Hydrotherapy (Pulse lavage) changed packing to silver hydrofiber due to more drainage    Pulsed lavage therapy - wound location  Lt ankle    Pulsed Lavage with Suction (psi)  4 psi    Pulsed Lavage with Suction - Normal Saline Used  1000 mL    Pulsed Lavage Tip  Tip with splash shield    Selective Debridement - Location  wound edges as far as forceps could reach     Selective Debridement - Tools Used  Forceps    Selective Debridement - Tissue Removed  slough; epiboled skin    Wound Therapy - Clinical Statement  Wound cleansed using pulse lavage.  Noted increased drainage when removed bandages so changed dressing to silver hydrofiber.  Had patient don stocking prior to application of juxta and pateint able to complete this, however increased time and poor posture exhibited.  Encouraged patient to get  help from son when she can so she doesn't hurt her back.  Encouaged pateint to wear stocking to help keep swelling reduced in foot.  Instrcted not to use pump with these in place.  Pt verbalized understanding.      Factors Delaying/Impairing Wound Healing  Diabetes Mellitus;Multiple medical problems;Immobility;Vascular compromise    Hydrotherapy Plan  Debridement;Dressing change;Patient/family education;Pulsatile lavage with suction    Wound Therapy - Frequency  2X / week    Wound Therapy - Current Recommendations  PT    Wound Plan  Call MD re possible wound center referral for nitro stick to epiboled edges of wound and possible wound vac referra;/      Dressing   silver hydrofiber packed into wound with 2X2 medipore over, followed by compression garment.                  PT Short Term Goals - 05/11/17 1026      PT SHORT TERM GOAL #1   Title  Pt Lt LE volumes to be decreased by 3 cm to reduce risk of wounds    Time  4    Period  Weeks    Status  Achieved      PT SHORT TERM GOAL #2   Title  PT Lt LE to no longer have heat or redness for evidence of decreased cellulitis.     Time  4    Period  Weeks    Status  Achieved      PT SHORT TERM GOAL #3   Title  PT to state that she has called [employer] to acquire new juxtafit garment.     Time  4    Period  Weeks    Status  Achieved      PT SHORT TERM GOAL #4   Title  PT to be ambulating with a rolling walker in order to reduce risk of falling     Baseline  Now that pain and volume has decreased pt is able to walk safely with her cane     Time  2    Period  Weeks    Status  Achieved        PT Long Term Goals - 05/13/17 1215      PT LONG TERM GOAL #1   Title  PT LT LE volume to be decreased by 6 cm to decrease pain to no greater than a 2/10 to allow pt to stand for 5-10 minutes without having to sit down to make a small meal.  Time  8    Period  Weeks    Status  Achieved in areas       PT LONG TERM GOAL #2    Title  Pt to be walking with a rolling walker for a 30 minutes at a time to improve lymph circulation.     Baseline  revised to cane:  pt is not walking encouraged pt to do so.     Time  8    Period  Weeks    Status  On-going      PT LONG TERM GOAL #3   Title  Pt to have acquired  new juxtafit to ensure proper compression to prevent future exacerbation of lymphedema.     Time  8    Period  Weeks    Status  On-going      PT LONG TERM GOAL #4   Title  Wound on Left ankle to depth to be .5 cm with no tunneling to allow pt to be confident in self care.     Time  6    Period  Weeks    Target Date  06/24/17      PT LONG TERM GOAL #5   Title  PT to be confident and completing self massage techniques for lymph drainage.     Time  6    Period  Weeks              Patient will benefit from skilled therapeutic intervention in order to improve the following deficits and impairments:     Visit Diagnosis: Wound of left lower extremity, subsequent encounter  Lymphedema, not elsewhere classified     Problem List Patient Active Problem List   Diagnosis Date Noted  . Intertrigo 05/11/2017  . OAB (overactive bladder) 02/22/2017  . Lymphedema 01/26/2017  . Gastroesophageal reflux disease without esophagitis 01/26/2017  . Acquired hypothyroidism 01/26/2017  . Mixed hyperlipidemia 01/26/2017  . Pyrexia   . Sleeps in sitting position due to orthopnea 07/31/2015  . Acute on chronic systolic congestive heart failure ([place]) 06/09/2015  . Morbid obesity due to excess calories ([place]) 06/09/2015  . PVD (peripheral vascular disease) with claudication ([place]) 06/09/2015  . OSA (obstructive sleep apnea) 06/09/2015  . Long term (current) use of anticoagulants 06/19/2010  . Tachycardia-bradycardia syndrome ([place]) 11/18/2008  . PPM-St.Jude 11/18/2008  . Hyperlipidemia associated with type 2 diabetes mellitus ([place]) 02/21/2008  . HYPERTENSION, BENIGN 02/21/2008  . ATRIAL FIBRILLATION 02/21/2008    Teena Irani, PTA/CLT [phone]  Teena Irani 06/02/2017, 11:01 AM  [place], [place], [address] Phone: [phone]   Fax:  [phone]  Name: Megan [name] MRN: [id number] Date of Birth: [birth year]

## 2017-06-03 ENCOUNTER — Encounter (HOSPITAL_COMMUNITY): Payer: Medicare Other | Admitting: Physical Therapy

## 2017-06-06 ENCOUNTER — Encounter (HOSPITAL_COMMUNITY): Payer: Medicare Other | Admitting: Physical Therapy

## 2017-06-07 ENCOUNTER — Telehealth (HOSPITAL_COMMUNITY): Payer: Self-pay | Admitting: Physician Assistant

## 2017-06-07 ENCOUNTER — Ambulatory Visit (HOSPITAL_COMMUNITY): Payer: Medicare Other | Admitting: Physical Therapy

## 2017-06-07 DIAGNOSIS — S81802D Unspecified open wound, left lower leg, subsequent encounter: Secondary | ICD-10-CM | POA: Diagnosis not present

## 2017-06-07 DIAGNOSIS — I890 Lymphedema, not elsewhere classified: Secondary | ICD-10-CM | POA: Diagnosis not present

## 2017-06-07 NOTE — Therapy (Addendum)
[place], [place], [address] Phone: [phone]   Fax:  [phone]  Wound Care Therapy  Patient Details  Name: Megan [name] MRN: [id number] Date of Birth: Apr 15, 1945 Referring Provider: Particia Nearing   Encounter Date: 06/07/2017  PT End of Session - 06/07/17 0855    Visit Number  30    Number of Visits  38    Date for PT Re-Evaluation  07/01/17    Authorization Type  Medicare.  Cert good through 2/22    Authorization - Visit Number  30    Authorization - Number of Visits  38    PT Start Time  0820    PT Stop Time  0845    PT Time Calculation (min)  25 min    Activity Tolerance  Patient tolerated treatment well    Behavior During Therapy  WFL for tasks assessed/performed       Past Medical History:  Diagnosis Date  . Acute diastolic heart failure ([place])   . Anxiety state, unspecified   . Congestive heart failure, unspecified   . Degenerative joint disease   . GERD (gastroesophageal reflux disease)   . Hyperlipidemia   . Hypertension   . Lymphedema   . Obesity   . Persistent atrial fibrillation (HCC)     post-termination pauses with afib s/p PPM  . Presence of permanent cardiac pacemaker   . Sinoatrial node dysfunction (HCC)    s/p PPM  . Sleep apnea    compliant with CPAP  . Unspecified hypothyroidism   . Unspecified venous (peripheral) insufficiency     Past Surgical History:  Procedure Laterality Date  . CATARACT EXTRACTION W/PHACO Left 09/09/2014   Procedure: CATARACT EXTRACTION PHACO AND INTRAOCULAR LENS PLACEMENT (IOC);  Surgeon: Tonny Branch, MD;  Location: [facility];  Service: Ophthalmology;  Laterality: Left;  CDE: 6.99  . CATARACT EXTRACTION W/PHACO Right 10/07/2014   Procedure: CATARACT EXTRACTION PHACO AND INTRAOCULAR LENS PLACEMENT RIGHT EYE;  Surgeon: Tonny Branch, MD;  Location: [facility];  Service: Ophthalmology;  Laterality: Right;  CDE:5.16  . PACEMAKER INSERTION     SJM by JA     There were no vitals filed for this visit.              Wound Therapy - 06/07/17 0849    Subjective  pt comes today with juxtas  in place bilaterally, however only with under stocking on Lt LE.  STates it hurts on her Rt. Reminded patient again about need to wear these as a system for optimal results.     Patient and Family Stated Goals  wound to heal     Pain Assessment  No/denies pain    Evaluation and Treatment Procedures Explained to Patient/Family  Yes    Evaluation and Treatment Procedures  agreed to    Wound Properties Date First Assessed: 04/10/17 Time First Assessed: 1014 Wound Type: Other (Comment) Location: Leg Location Orientation: Left   Dressing Type  Compression wrap;Gauze (Comment) medihoney    Dressing Changed  Changed    Dressing Status  New drainage    Dressing Change Frequency  PRN    Site / Wound Assessment  Bleeding;Clean    % Wound base Red or Granulating  100%    % Wound base Yellow/Fibrinous Exudate  0%    % Wound base Black/Eschar  0%    % Wound base Other/Granulation Tissue (Comment)  0%    Peri-wound Assessment  Edema;Induration  Wound Length (cm)  0.6 cm    Wound Width (cm)  0.6 cm    Wound Depth (cm)  1.8 cm at least, may be deeper in some areas    Wound Volume (cm^3)  0.65 cm^3    Wound Surface Area (cm^2)  0.36 cm^2    Margins  Epibole (rolled edges)    Drainage Amount  Moderate    Drainage Description  Serosanguineous    Treatment  Hydrotherapy (Pulse lavage)    Pulsed lavage therapy - wound location  Lt ankle    Pulsed Lavage with Suction (psi)  4 psi    Pulsed Lavage with Suction - Normal Saline Used  1000 mL    Pulsed Lavage Tip  Tip with splash shield    Selective Debridement - Location  wound edges as far as Foreps/qtip could reach     Selective Debridement - Tools Used  Forceps Qtip    Selective Debridement - Tissue Removed  slough; epiboled skin    Wound Therapy - Clinical Statement  Noted soaked serosanginous drainage on  silver hydrofiber strip when removed indicating continued moderate drainage.  No odor present, however with same depth and induration around wound.  No signs/symptoms of infection present.  Encouraged patient to call MD appt to follow up on referral as wound has not changed from last weeks measurements/healing stage.  Repacked with 12cm strip approx 1.5cm wide into wound.     Factors Delaying/Impairing Wound Healing  Diabetes Mellitus;Multiple medical problems;Immobility;Vascular compromise    Hydrotherapy Plan  Debridement;Dressing change;Patient/family education;Pulsatile lavage with suction    Wound Therapy - Frequency  2X / week    Wound Therapy - Current Recommendations  PT    Wound Plan  Continue pulse lavage for proper irrigation/debridement of wound and appropriate dressing.  follow up with patient on MD contact for referral for possible vac placement.      Dressing   silver hydrofiber packed into wound with 2X2 medipore over, followed by compression garment.                  PT Short Term Goals - 05/11/17 1026      PT SHORT TERM GOAL #1   Title  Pt Lt LE volumes to be decreased by 3 cm to reduce risk of wounds    Time  4    Period  Weeks    Status  Achieved      PT SHORT TERM GOAL #2   Title  PT Lt LE to no longer have heat or redness for evidence of decreased cellulitis.     Time  4    Period  Weeks    Status  Achieved      PT SHORT TERM GOAL #3   Title  PT to state that she has called [employer] to acquire new juxtafit garment.     Time  4    Period  Weeks    Status  Achieved      PT SHORT TERM GOAL #4   Title  PT to be ambulating with a rolling walker in order to reduce risk of falling     Baseline  Now that pain and volume has decreased pt is able to walk safely with her cane     Time  2    Period  Weeks    Status  Achieved        PT Long Term Goals - 05/13/17 1215      PT LONG TERM  GOAL #1   Title  PT LT LE volume to be decreased by 6 cm to decrease  pain to no greater than a 2/10 to allow pt to stand for 5-10 minutes without having to sit down to make a small meal.     Time  8    Period  Weeks    Status  Achieved in areas       PT LONG TERM GOAL #2   Title  Pt to be walking with a rolling walker for a 30 minutes at a time to improve lymph circulation.     Baseline  revised to cane:  pt is not walking encouraged pt to do so.     Time  8    Period  Weeks    Status  On-going      PT LONG TERM GOAL #3   Title  Pt to have acquired  new juxtafit to ensure proper compression to prevent future exacerbation of lymphedema.     Time  8    Period  Weeks    Status  On-going      PT LONG TERM GOAL #4   Title  Wound on Left ankle to depth to be .5 cm with no tunneling to allow pt to be confident in self care.     Time  6    Period  Weeks    Target Date  06/24/17      PT LONG TERM GOAL #5   Title  PT to be confident and completing self massage techniques for lymph drainage.     Time  6    Period  Weeks              Patient will benefit from skilled therapeutic intervention in order to improve the following deficits and impairments:     Visit Diagnosis: Wound of left lower extremity, subsequent encounter     Problem List Patient Active Problem List   Diagnosis Date Noted  . Intertrigo 05/11/2017  . OAB (overactive bladder) 02/22/2017  . Lymphedema 01/26/2017  . Gastroesophageal reflux disease without esophagitis 01/26/2017  . Acquired hypothyroidism 01/26/2017  . Mixed hyperlipidemia 01/26/2017  . Pyrexia   . Sleeps in sitting position due to orthopnea 07/31/2015  . Acute on chronic systolic congestive heart failure ([place]) 06/09/2015  . Morbid obesity due to excess calories ([place]) 06/09/2015  . PVD (peripheral vascular disease) with claudication ([place]) 06/09/2015  . OSA (obstructive sleep apnea) 06/09/2015  . Long term (current) use of anticoagulants 06/19/2010  . Tachycardia-bradycardia syndrome ([place]) 11/18/2008  .  PPM-St.Jude 11/18/2008  . Hyperlipidemia associated with type 2 diabetes mellitus ([place]) 02/21/2008  . HYPERTENSION, BENIGN 02/21/2008  . ATRIAL FIBRILLATION 02/21/2008   Teena Irani, PTA/CLT [phone]  Teena Irani 06/07/2017, 8:58 AM  [place], [place], [address] Phone: [phone]   Fax:  [phone]  Name: Megan [name] MRN: [id number] Date of Birth: 24-Apr-1945    12/15/2017  PHYSICAL THERAPY DISCHARGE SUMMARY  Visits from Start of Care: 30 pt seen for lymphedema with wound.  D/C from lymphedema but continued to have a nonhealing wound.  Pt began being seen by the wound center.  Current functional level related to goals / functional outcomes: Edema controled    Remaining deficits: Nonhealing wound    Education / Equipment: As above.  Plan: Patient agrees to discharge.  Patient goals were partially met. Patient is being discharged due to the patient's  request.  ?????     Rayetta Humphrey, [place] CLT [phone]

## 2017-06-07 NOTE — Telephone Encounter (Signed)
06/07/17  pt called and cx appt for 3/14 said she was going to another wound place on this date

## 2017-06-08 ENCOUNTER — Encounter (HOSPITAL_COMMUNITY): Payer: Medicare Other | Admitting: Physical Therapy

## 2017-06-09 ENCOUNTER — Telehealth (HOSPITAL_COMMUNITY): Payer: Self-pay | Admitting: Physical Therapy

## 2017-06-09 ENCOUNTER — Encounter (HOSPITAL_COMMUNITY): Payer: Medicare Other | Admitting: Physical Therapy

## 2017-06-09 ENCOUNTER — Ambulatory Visit (INDEPENDENT_AMBULATORY_CARE_PROVIDER_SITE_OTHER): Payer: Medicare Other | Admitting: *Deleted

## 2017-06-09 DIAGNOSIS — I4891 Unspecified atrial fibrillation: Secondary | ICD-10-CM | POA: Diagnosis not present

## 2017-06-09 DIAGNOSIS — Z79899 Other long term (current) drug therapy: Secondary | ICD-10-CM | POA: Diagnosis not present

## 2017-06-09 DIAGNOSIS — S9002XA Contusion of left ankle, initial encounter: Secondary | ICD-10-CM | POA: Diagnosis not present

## 2017-06-09 DIAGNOSIS — S8012XA Contusion of left lower leg, initial encounter: Secondary | ICD-10-CM | POA: Diagnosis not present

## 2017-06-09 DIAGNOSIS — I110 Hypertensive heart disease with heart failure: Secondary | ICD-10-CM | POA: Diagnosis not present

## 2017-06-09 DIAGNOSIS — I739 Peripheral vascular disease, unspecified: Secondary | ICD-10-CM | POA: Diagnosis not present

## 2017-06-09 DIAGNOSIS — Z5181 Encounter for therapeutic drug level monitoring: Secondary | ICD-10-CM

## 2017-06-09 DIAGNOSIS — E0590 Thyrotoxicosis, unspecified without thyrotoxic crisis or storm: Secondary | ICD-10-CM | POA: Diagnosis not present

## 2017-06-09 DIAGNOSIS — I482 Chronic atrial fibrillation: Secondary | ICD-10-CM | POA: Diagnosis not present

## 2017-06-09 DIAGNOSIS — I509 Heart failure, unspecified: Secondary | ICD-10-CM | POA: Diagnosis not present

## 2017-06-09 DIAGNOSIS — E785 Hyperlipidemia, unspecified: Secondary | ICD-10-CM | POA: Diagnosis not present

## 2017-06-09 DIAGNOSIS — Z7901 Long term (current) use of anticoagulants: Secondary | ICD-10-CM | POA: Diagnosis not present

## 2017-06-09 DIAGNOSIS — Z950 Presence of cardiac pacemaker: Secondary | ICD-10-CM | POA: Diagnosis not present

## 2017-06-09 LAB — POCT INR: INR: 3.8

## 2017-06-09 NOTE — Patient Instructions (Signed)
Hold coumadin tonight then decrease dose to 1 tablet daily except 1 1/2 tablets on Thursdays Recheck 06/14/17 Saw Dr Nils Pyle today.  Has draining hematoma Lt leg.  He thinks it's related to coumadin Tx.  Wants INR goal decreased to 2.2 - 2.5.  INR goal and coumadin dose adjusted

## 2017-06-09 NOTE — Telephone Encounter (Signed)
Pt states she saw Wound Specialist and they broke up the blood clots and that's why she was not healing she will return on 06/21/17

## 2017-06-13 ENCOUNTER — Encounter (HOSPITAL_COMMUNITY): Payer: Medicare Other | Admitting: Physical Therapy

## 2017-06-14 ENCOUNTER — Ambulatory Visit (INDEPENDENT_AMBULATORY_CARE_PROVIDER_SITE_OTHER): Payer: Medicare Other | Admitting: *Deleted

## 2017-06-14 ENCOUNTER — Encounter (HOSPITAL_COMMUNITY): Payer: Medicare Other | Admitting: Physical Therapy

## 2017-06-14 DIAGNOSIS — Z7901 Long term (current) use of anticoagulants: Secondary | ICD-10-CM | POA: Diagnosis not present

## 2017-06-14 DIAGNOSIS — I4891 Unspecified atrial fibrillation: Secondary | ICD-10-CM | POA: Diagnosis not present

## 2017-06-14 DIAGNOSIS — Z5181 Encounter for therapeutic drug level monitoring: Secondary | ICD-10-CM | POA: Diagnosis not present

## 2017-06-14 DIAGNOSIS — E0590 Thyrotoxicosis, unspecified without thyrotoxic crisis or storm: Secondary | ICD-10-CM | POA: Diagnosis not present

## 2017-06-14 DIAGNOSIS — S8012XD Contusion of left lower leg, subsequent encounter: Secondary | ICD-10-CM | POA: Diagnosis not present

## 2017-06-14 DIAGNOSIS — I482 Chronic atrial fibrillation: Secondary | ICD-10-CM | POA: Diagnosis not present

## 2017-06-14 DIAGNOSIS — E785 Hyperlipidemia, unspecified: Secondary | ICD-10-CM | POA: Diagnosis not present

## 2017-06-14 DIAGNOSIS — I110 Hypertensive heart disease with heart failure: Secondary | ICD-10-CM | POA: Diagnosis not present

## 2017-06-14 DIAGNOSIS — S9002XA Contusion of left ankle, initial encounter: Secondary | ICD-10-CM | POA: Diagnosis not present

## 2017-06-14 LAB — POCT INR: INR: 1.7

## 2017-06-14 NOTE — Patient Instructions (Signed)
Take coumadin 1 1/2 tablets tonight then increase dose to 1 tablet daily except 1 1/2 tablets on Mondays and Thursdays Recheck 06/28/17

## 2017-06-15 ENCOUNTER — Encounter (HOSPITAL_COMMUNITY): Payer: Medicare Other | Admitting: Physical Therapy

## 2017-06-16 ENCOUNTER — Encounter (HOSPITAL_COMMUNITY): Payer: Medicare Other | Admitting: Physical Therapy

## 2017-06-17 DIAGNOSIS — I110 Hypertensive heart disease with heart failure: Secondary | ICD-10-CM | POA: Diagnosis not present

## 2017-06-17 DIAGNOSIS — E0590 Thyrotoxicosis, unspecified without thyrotoxic crisis or storm: Secondary | ICD-10-CM | POA: Diagnosis not present

## 2017-06-17 DIAGNOSIS — I482 Chronic atrial fibrillation: Secondary | ICD-10-CM | POA: Diagnosis not present

## 2017-06-17 DIAGNOSIS — S9002XA Contusion of left ankle, initial encounter: Secondary | ICD-10-CM | POA: Diagnosis not present

## 2017-06-17 DIAGNOSIS — Z7901 Long term (current) use of anticoagulants: Secondary | ICD-10-CM | POA: Diagnosis not present

## 2017-06-17 DIAGNOSIS — E785 Hyperlipidemia, unspecified: Secondary | ICD-10-CM | POA: Diagnosis not present

## 2017-06-20 ENCOUNTER — Encounter (HOSPITAL_COMMUNITY): Payer: Medicare Other | Admitting: Physical Therapy

## 2017-06-20 ENCOUNTER — Telehealth (HOSPITAL_COMMUNITY): Payer: Self-pay | Admitting: Physician Assistant

## 2017-06-20 NOTE — Telephone Encounter (Signed)
06/20/17  pt called to cx she said she left a message and called back to make sure we had cx this appt.

## 2017-06-21 ENCOUNTER — Ambulatory Visit (HOSPITAL_COMMUNITY): Payer: Medicare Other | Admitting: Physical Therapy

## 2017-06-22 ENCOUNTER — Encounter (HOSPITAL_COMMUNITY): Payer: Medicare Other | Admitting: Physical Therapy

## 2017-06-22 ENCOUNTER — Telehealth (HOSPITAL_COMMUNITY): Payer: Self-pay | Admitting: Physician Assistant

## 2017-06-22 NOTE — Telephone Encounter (Signed)
06/22/17  pt called and cx tomorrow's appt and she said she didn't know if she would be back or not because she is seeing a dr at the wound clinic in [place]

## 2017-06-23 ENCOUNTER — Ambulatory Visit (HOSPITAL_COMMUNITY): Payer: Medicare Other | Admitting: Physical Therapy

## 2017-06-23 DIAGNOSIS — E785 Hyperlipidemia, unspecified: Secondary | ICD-10-CM | POA: Diagnosis not present

## 2017-06-23 DIAGNOSIS — I110 Hypertensive heart disease with heart failure: Secondary | ICD-10-CM | POA: Diagnosis not present

## 2017-06-23 DIAGNOSIS — I482 Chronic atrial fibrillation: Secondary | ICD-10-CM | POA: Diagnosis not present

## 2017-06-23 DIAGNOSIS — S8012XD Contusion of left lower leg, subsequent encounter: Secondary | ICD-10-CM | POA: Diagnosis not present

## 2017-06-23 DIAGNOSIS — Z7901 Long term (current) use of anticoagulants: Secondary | ICD-10-CM | POA: Diagnosis not present

## 2017-06-23 DIAGNOSIS — E0590 Thyrotoxicosis, unspecified without thyrotoxic crisis or storm: Secondary | ICD-10-CM | POA: Diagnosis not present

## 2017-06-23 DIAGNOSIS — S9002XA Contusion of left ankle, initial encounter: Secondary | ICD-10-CM | POA: Diagnosis not present

## 2017-06-28 ENCOUNTER — Encounter (HOSPITAL_COMMUNITY): Payer: Medicare Other | Admitting: Physical Therapy

## 2017-06-28 ENCOUNTER — Ambulatory Visit (INDEPENDENT_AMBULATORY_CARE_PROVIDER_SITE_OTHER): Payer: Medicare Other | Admitting: *Deleted

## 2017-06-28 DIAGNOSIS — I4891 Unspecified atrial fibrillation: Secondary | ICD-10-CM | POA: Diagnosis not present

## 2017-06-28 DIAGNOSIS — Z5181 Encounter for therapeutic drug level monitoring: Secondary | ICD-10-CM | POA: Diagnosis not present

## 2017-06-28 DIAGNOSIS — S8012XD Contusion of left lower leg, subsequent encounter: Secondary | ICD-10-CM | POA: Diagnosis not present

## 2017-06-28 LAB — POCT INR: INR: 2.8

## 2017-06-28 NOTE — Patient Instructions (Signed)
Continue coumadin 1 tablet daily except 1 1/2 tablets on Mondays and Thursdays Recheck 07/12/17 Continue greens

## 2017-06-30 ENCOUNTER — Encounter (HOSPITAL_COMMUNITY): Payer: Medicare Other | Admitting: Physical Therapy

## 2017-07-05 ENCOUNTER — Encounter (HOSPITAL_COMMUNITY): Payer: Medicare Other | Admitting: Physical Therapy

## 2017-07-05 ENCOUNTER — Encounter (HOSPITAL_COMMUNITY): Payer: Self-pay | Admitting: Emergency Medicine

## 2017-07-05 ENCOUNTER — Telehealth: Payer: Self-pay | Admitting: *Deleted

## 2017-07-05 ENCOUNTER — Emergency Department (HOSPITAL_COMMUNITY): Payer: Medicare Other

## 2017-07-05 ENCOUNTER — Observation Stay (HOSPITAL_COMMUNITY)
Admission: EM | Admit: 2017-07-05 | Discharge: 2017-07-07 | Disposition: A | Discharge status: Home / Self Care | Payer: Medicare Other | Attending: Emergency Medicine | Admitting: Emergency Medicine

## 2017-07-05 ENCOUNTER — Other Ambulatory Visit: Payer: Self-pay

## 2017-07-05 DIAGNOSIS — I4891 Unspecified atrial fibrillation: Secondary | ICD-10-CM | POA: Diagnosis not present

## 2017-07-05 DIAGNOSIS — I445 Left posterior fascicular block: Secondary | ICD-10-CM | POA: Diagnosis not present

## 2017-07-05 DIAGNOSIS — E782 Mixed hyperlipidemia: Secondary | ICD-10-CM | POA: Diagnosis not present

## 2017-07-05 DIAGNOSIS — R531 Weakness: Secondary | ICD-10-CM | POA: Diagnosis not present

## 2017-07-05 DIAGNOSIS — I110 Hypertensive heart disease with heart failure: Secondary | ICD-10-CM | POA: Insufficient documentation

## 2017-07-05 DIAGNOSIS — I10 Essential (primary) hypertension: Secondary | ICD-10-CM | POA: Diagnosis not present

## 2017-07-05 DIAGNOSIS — J40 Bronchitis, not specified as acute or chronic: Secondary | ICD-10-CM | POA: Diagnosis present

## 2017-07-05 DIAGNOSIS — I5022 Chronic systolic (congestive) heart failure: Secondary | ICD-10-CM | POA: Diagnosis not present

## 2017-07-05 DIAGNOSIS — R05 Cough: Secondary | ICD-10-CM | POA: Diagnosis not present

## 2017-07-05 DIAGNOSIS — E039 Hypothyroidism, unspecified: Secondary | ICD-10-CM | POA: Diagnosis not present

## 2017-07-05 DIAGNOSIS — R509 Fever, unspecified: Secondary | ICD-10-CM | POA: Diagnosis not present

## 2017-07-05 DIAGNOSIS — I4510 Unspecified right bundle-branch block: Secondary | ICD-10-CM | POA: Diagnosis not present

## 2017-07-05 DIAGNOSIS — R0989 Other specified symptoms and signs involving the circulatory and respiratory systems: Secondary | ICD-10-CM | POA: Insufficient documentation

## 2017-07-05 DIAGNOSIS — I4821 Permanent atrial fibrillation: Secondary | ICD-10-CM | POA: Diagnosis present

## 2017-07-05 DIAGNOSIS — R110 Nausea: Secondary | ICD-10-CM | POA: Diagnosis not present

## 2017-07-05 DIAGNOSIS — Z79899 Other long term (current) drug therapy: Secondary | ICD-10-CM | POA: Diagnosis not present

## 2017-07-05 DIAGNOSIS — Z7901 Long term (current) use of anticoagulants: Secondary | ICD-10-CM | POA: Insufficient documentation

## 2017-07-05 DIAGNOSIS — R0602 Shortness of breath: Secondary | ICD-10-CM | POA: Insufficient documentation

## 2017-07-05 DIAGNOSIS — Z950 Presence of cardiac pacemaker: Secondary | ICD-10-CM | POA: Diagnosis not present

## 2017-07-05 DIAGNOSIS — R6883 Chills (without fever): Secondary | ICD-10-CM | POA: Diagnosis not present

## 2017-07-05 DIAGNOSIS — G4733 Obstructive sleep apnea (adult) (pediatric): Secondary | ICD-10-CM | POA: Diagnosis present

## 2017-07-05 DIAGNOSIS — Z87891 Personal history of nicotine dependence: Secondary | ICD-10-CM | POA: Insufficient documentation

## 2017-07-05 DIAGNOSIS — R404 Transient alteration of awareness: Secondary | ICD-10-CM | POA: Diagnosis not present

## 2017-07-05 LAB — URINALYSIS, ROUTINE W REFLEX MICROSCOPIC
BILIRUBIN URINE: NEGATIVE
GLUCOSE, UA: NEGATIVE mg/dL
HGB URINE DIPSTICK: NEGATIVE
Ketones, ur: NEGATIVE mg/dL
NITRITE: NEGATIVE
PH: 5 (ref 5.0–8.0)
Protein, ur: NEGATIVE mg/dL
SPECIFIC GRAVITY, URINE: 1.025 (ref 1.005–1.030)

## 2017-07-05 LAB — COMPREHENSIVE METABOLIC PANEL
ALBUMIN: 3.2 g/dL — AB (ref 3.5–5.0)
ALT: 29 U/L (ref 14–54)
ANION GAP: 12 (ref 5–15)
AST: 29 U/L (ref 15–41)
Alkaline Phosphatase: 117 U/L (ref 38–126)
BILIRUBIN TOTAL: 1 mg/dL (ref 0.3–1.2)
BUN: 16 mg/dL (ref 6–20)
CO2: 24 mmol/L (ref 22–32)
Calcium: 8.7 mg/dL — ABNORMAL LOW (ref 8.9–10.3)
Chloride: 100 mmol/L — ABNORMAL LOW (ref 101–111)
Creatinine, Ser: 0.79 mg/dL (ref 0.44–1.00)
GFR calc Af Amer: >60 mL/min (ref >60)
GFR calc non Af Amer: >60 mL/min (ref >60)
GLUCOSE: 138 mg/dL — AB (ref 65–99)
Potassium: 4.2 mmol/L (ref 3.5–5.1)
SODIUM: 136 mmol/L (ref 135–145)
TOTAL PROTEIN: 7 g/dL (ref 6.5–8.1)

## 2017-07-05 LAB — CBC WITH DIFFERENTIAL/PLATELET
BASOS ABS: 0 10*3/uL (ref 0.0–0.1)
Basophils Relative: 0 %
EOS PCT: 0 %
Eosinophils Absolute: 0 10*3/uL (ref 0.0–0.7)
HCT: 42.4 % (ref 36.0–46.0)
Hemoglobin: 13.8 g/dL (ref 12.0–15.0)
LYMPHS PCT: 8 %
Lymphs Abs: 0.7 10*3/uL (ref 0.7–4.0)
MCH: 30.7 pg (ref 26.0–34.0)
MCHC: 32.5 g/dL (ref 30.0–36.0)
MCV: 94.2 fL (ref 78.0–100.0)
MONO ABS: 0.8 10*3/uL (ref 0.1–1.0)
MONOS PCT: 9 %
NEUTROS ABS: 7.2 10*3/uL (ref 1.7–7.7)
Neutrophils Relative %: 83 %
Platelets: 243 10*3/uL (ref 150–400)
RBC: 4.5 MIL/uL (ref 3.87–5.11)
RDW: 15.3 % (ref 11.5–15.5)
WBC: 8.8 10*3/uL (ref 4.0–10.5)

## 2017-07-05 LAB — INFLUENZA PANEL BY PCR (TYPE A & B)
INFLAPCR: NEGATIVE
INFLBPCR: NEGATIVE

## 2017-07-05 LAB — TROPONIN I

## 2017-07-05 LAB — BRAIN NATRIURETIC PEPTIDE: B Natriuretic Peptide: 135 pg/mL — ABNORMAL HIGH (ref 0.0–100.0)

## 2017-07-05 LAB — PROTIME-INR
INR: 1.73
Prothrombin Time: 20.1 seconds — ABNORMAL HIGH (ref 11.4–15.2)

## 2017-07-05 LAB — I-STAT CG4 LACTIC ACID, ED: Lactic Acid, Venous: 1.31 mmol/L (ref 0.5–1.9)

## 2017-07-05 LAB — APTT: APTT: 25 s (ref 24–36)

## 2017-07-05 MED ORDER — ALPRAZOLAM 0.25 MG PO TABS
0.2500 mg | ORAL_TABLET | Freq: Every evening | ORAL | Status: DC | PRN
  Administered 2017-07-05 – 2017-07-06 (×2): 0.25 mg via ORAL
  Filled 2017-07-05 (×2): qty 1

## 2017-07-05 MED ORDER — ONDANSETRON HCL 4 MG/2ML IJ SOLN: 4.0000 mg | Freq: Four times a day (QID) | INTRAMUSCULAR | Status: DC | PRN

## 2017-07-05 MED ORDER — IPRATROPIUM BROMIDE 0.02 % IN SOLN
0.5000 mg | Freq: Four times a day (QID) | RESPIRATORY_TRACT | Status: DC
  Administered 2017-07-05 – 2017-07-07 (×9): 0.5 mg via RESPIRATORY_TRACT
  Filled 2017-07-05 (×9): qty 2.5

## 2017-07-05 MED ORDER — GUAIFENESIN ER 600 MG PO TB12
1200.0000 mg | ORAL_TABLET | Freq: Two times a day (BID) | ORAL | Status: DC
  Administered 2017-07-05 – 2017-07-07 (×4): 1200 mg via ORAL
  Filled 2017-07-05 (×4): qty 2

## 2017-07-05 MED ORDER — ONDANSETRON HCL 4 MG PO TABS: 4.0000 mg | ORAL_TABLET | Freq: Four times a day (QID) | ORAL | Status: DC | PRN

## 2017-07-05 MED ORDER — LEVALBUTEROL HCL 0.63 MG/3ML IN NEBU: 0.6300 mg | INHALATION_SOLUTION | Freq: Four times a day (QID) | RESPIRATORY_TRACT | Status: DC | PRN

## 2017-07-05 MED ORDER — DILTIAZEM HCL ER COATED BEADS 240 MG PO CP24
240.0000 mg | ORAL_CAPSULE | Freq: Every day | ORAL | Status: DC
  Administered 2017-07-06 – 2017-07-07 (×2): 240 mg via ORAL
  Filled 2017-07-05 (×2): qty 1

## 2017-07-05 MED ORDER — WARFARIN - PHARMACIST DOSING INPATIENT
Status: DC
  Administered 2017-07-06: 17:00:00

## 2017-07-05 MED ORDER — ACETAMINOPHEN 325 MG PO TABS: 650.0000 mg | ORAL_TABLET | Freq: Four times a day (QID) | ORAL | Status: DC | PRN

## 2017-07-05 MED ORDER — WARFARIN SODIUM 5 MG PO TABS
5.0000 mg | ORAL_TABLET | Freq: Once | ORAL | Status: AC
  Administered 2017-07-05: 5 mg via ORAL
  Filled 2017-07-05: qty 1

## 2017-07-05 MED ORDER — AZITHROMYCIN 250 MG PO TABS
500.0000 mg | ORAL_TABLET | Freq: Every day | ORAL | Status: AC
  Administered 2017-07-05: 500 mg via ORAL
  Filled 2017-07-05: qty 2

## 2017-07-05 MED ORDER — ATORVASTATIN CALCIUM 40 MG PO TABS
80.0000 mg | ORAL_TABLET | Freq: Every day | ORAL | Status: DC
  Administered 2017-07-05 – 2017-07-06 (×2): 80 mg via ORAL
  Filled 2017-07-05 (×2): qty 2

## 2017-07-05 MED ORDER — METOPROLOL TARTRATE 50 MG PO TABS
50.0000 mg | ORAL_TABLET | Freq: Two times a day (BID) | ORAL | Status: DC
  Administered 2017-07-05 – 2017-07-06 (×2): 50 mg via ORAL
  Filled 2017-07-05 (×2): qty 1

## 2017-07-05 MED ORDER — LEVALBUTEROL HCL 0.63 MG/3ML IN NEBU
0.6300 mg | INHALATION_SOLUTION | Freq: Four times a day (QID) | RESPIRATORY_TRACT | Status: DC
  Administered 2017-07-05 – 2017-07-07 (×9): 0.63 mg via RESPIRATORY_TRACT
  Filled 2017-07-05 (×9): qty 3

## 2017-07-05 MED ORDER — ACETAMINOPHEN 650 MG RE SUPP: 650.0000 mg | Freq: Four times a day (QID) | RECTAL | Status: DC | PRN

## 2017-07-05 MED ORDER — LEVOTHYROXINE SODIUM 75 MCG PO TABS
150.0000 ug | ORAL_TABLET | ORAL | Status: DC
  Administered 2017-07-06 – 2017-07-07 (×2): 150 ug via ORAL
  Filled 2017-07-05 (×2): qty 2

## 2017-07-05 MED ORDER — AZITHROMYCIN 250 MG PO TABS
250.0000 mg | ORAL_TABLET | Freq: Every day | ORAL | Status: DC
  Administered 2017-07-06 – 2017-07-07 (×2): 250 mg via ORAL
  Filled 2017-07-05 (×2): qty 1

## 2017-07-05 MED ORDER — METHYLPREDNISOLONE SODIUM SUCC 125 MG IJ SOLR
60.0000 mg | Freq: Two times a day (BID) | INTRAMUSCULAR | Status: DC
  Administered 2017-07-05 – 2017-07-06 (×3): 60 mg via INTRAVENOUS
  Filled 2017-07-05 (×3): qty 2

## 2017-07-05 MED ORDER — BENZONATATE 100 MG PO CAPS
200.0000 mg | ORAL_CAPSULE | Freq: Three times a day (TID) | ORAL | Status: DC
  Administered 2017-07-05 – 2017-07-07 (×6): 200 mg via ORAL
  Filled 2017-07-05 (×6): qty 2

## 2017-07-05 NOTE — ED Notes (Signed)
Unable to get a second IV access .  Attempted with [place].

## 2017-07-05 NOTE — ED Provider Notes (Signed)
MSE was initiated and I personally evaluated the patient and placed orders (if any) at  6:40 AM on July 05, 2017.  The patient appears stable so that the remainder of the MSE may be completed by another provider.  Patient states she started feeling bad on April 5 with nausea, decreased appetite because of the nausea, and diarrhea about 3-4 episodes of loose stool a day.  She denies abdominal pain.  She states she has been wheezing before she got the GI symptoms.  She states she has a cough of white sometimes yellow tinged sputum.  She denies cigarette use.  She states she has had wheezing before however she does not have a inhaler or nebulizer at home.  Patient does not appear to be in respiratory distress during my exam.  However when I listen to her she does have some diffuse end expiratory wheezing.  She has some diffuse swelling of her lower legs.  Rolland Porter, MD, Barbette Or, MD 07/05/17 [phone]

## 2017-07-05 NOTE — ED Notes (Signed)
PT no distress at this time.

## 2017-07-05 NOTE — Telephone Encounter (Signed)
INR 1.73 per ER nurse JJ can  Call ER or pt at home

## 2017-07-05 NOTE — ED Notes (Signed)
Pt ambulatory to bathroom.  Pulse ox stayed 92-94% while ambulating.  Pt did c/o some sob.

## 2017-07-05 NOTE — ED Triage Notes (Signed)
Pt c/o increased sob and generalized weakness x 5 days. Pt states her son has been sick.

## 2017-07-05 NOTE — Telephone Encounter (Signed)
Pt was admitted to [facility] with Dx of bronchitis.

## 2017-07-05 NOTE — Progress Notes (Signed)
ANTICOAGULATION CONSULT NOTE - Initial Consult  Pharmacy Consult for Coumadin (home med) Indication: atrial fibrillation  No Known Allergies  Patient Measurements: Height: 5\' 2"  (157.5 cm) Weight: 287 lb (130.2 kg) IBW/kg (Calculated) : 50.1  Vital Signs: Temp: 98.2 F (36.8 C) (04/09 1507) Temp Source: Oral (04/09 1507) BP: 155/67 (04/09 1507) Pulse Rate: 88 (04/09 1507)  Labs: Recent Labs    07/05/17 0741  HGB 13.8  HCT 42.4  PLT 243  APTT 25  LABPROT 20.1*  INR 1.73  CREATININE 0.79  TROPONINI <0.03    Estimated Creatinine Clearance: 81.2 mL/min (by C-G formula based on SCr of 0.79 mg/dL).   Medical History: Past Medical History:  Diagnosis Date  . Acute diastolic heart failure ([place])   . Anxiety state, unspecified   . Congestive heart failure, unspecified   . Degenerative joint disease   . GERD (gastroesophageal reflux disease)   . Hyperlipidemia   . Hypertension   . Lymphedema   . Obesity   . Persistent atrial fibrillation ([facility])     post-termination pauses with afib s/p PPM  . Presence of permanent cardiac pacemaker   . Sinoatrial node dysfunction (HCC)    s/p PPM  . Sleep apnea    compliant with CPAP  . Unspecified hypothyroidism   . Unspecified venous (peripheral) insufficiency     Medications:  Medications Prior to Admission  Medication Sig Dispense Refill Last Dose  . acetaminophen (TYLENOL) 500 MG tablet Take 500 mg by mouth every 6 (six) hours as needed for mild pain or moderate pain.   07/04/2017 at Unknown time  . ALPRAZolam (XANAX) 0.25 MG tablet Take 0.25 mg by mouth at bedtime as needed for anxiety or sleep.    Past Week at Unknown time  . atorvastatin (LIPITOR) 80 MG tablet Take 1 tablet (80 mg total) by mouth daily. 90 tablet 3 07/04/2017 at 1830  . diltiazem (CARDIZEM CD) 240 MG 24 hr capsule Take 1 capsule (240 mg total) by mouth daily. 90 capsule 3 07/05/2017 at 0630  . levothyroxine (SYNTHROID, LEVOTHROID) 150 MCG tablet Take 1 tablet  (150 mcg total) by mouth every morning. 90 tablet 3 07/04/2017 at Unknown time  . metoprolol tartrate (LOPRESSOR) 100 MG tablet TAKE ONE-HALF TABLET BY MOUTH TWICE DAILY 90 tablet 3 07/04/2017 at 1830  . silver sulfADIAZINE (SILVADENE) 1 % cream Apply 1 application topically daily. 50 g 0 Taking  . warfarin (COUMADIN) 3 MG tablet TAKE 1 TABLET BY MOUTH DAILY EXCEPT TAKE 1 AND 1/2 (ONE-HALF) TABLETS ON SUNDAYS, TUESDAYS AND THURSDAYS 45 tablet 4 07/04/2017 at 1830  . clotrimazole-betamethasone (LOTRISONE) cream Apply 1 application topically daily as needed (for affected area(S)).   Taking  . Vitamin D, Ergocalciferol, (DRISDOL) 50000 units CAPS capsule Take 1 capsule (50,000 Units total) by mouth every 7 (seven) days. Takes on Thursdays. 12 capsule 3 06/30/2017   Assessment: 72yo female on chronic Coumadin PTA.  INR is below goal on admission.   Goal of Therapy:  INR 2-3 Monitor platelets by anticoagulation protocol: Yes   Plan:  Coumadin 5mg  today x 1 (to boost INR) INR daily  Hart Robinsons A 07/05/2017,4:02 PM

## 2017-07-05 NOTE — H&P (Signed)
History and Physical    SAMERIA MORSS RCV:[id number] DOB: 11-15-1945 DOA: 07/05/2017  PCP: Terald Sleeper, PA-C  Patient coming from: Home  I have personally briefly reviewed patient's old medical records in [place]  Chief Complaint: Shortness of breath  HPI: Megan [name] is a 72 y.o. female with medical history significant of atrial fibrillation on chronic anticoagulation, hypothyroidism, hypertension, hyperlipidemia, presents to the hospital with complaints of shortness of breath.  Patient had onset of symptoms approximately 1 week ago.  She has had progressive dyspnea, cough productive of thick yellow colored sputum.  She has been feverish and reports a T-max of 100.5.  She reports having some vomiting and diarrhea but has not had any in the past 48 hours.  She has nausea with loss of appetite.  She feels generally weak.  Reports that her son has similar symptoms.  No chest pain or dysuria..  ED Course: Chest x-ray did not show any evidence of pneumonia.  She was noted to be mildly tachycardic in atrial fibrillation.  Lactic acid was noted to be normal.  Remaining lab work was also unrevealing.  She was noted to be short of breath and bilaterally wheezing.  Review of Systems: As per HPI otherwise 10 point review of systems negative.    Past Medical History:  Diagnosis Date  . Acute diastolic heart failure ([place])   . Anxiety state, unspecified   . Congestive heart failure, unspecified   . Degenerative joint disease   . GERD (gastroesophageal reflux disease)   . Hyperlipidemia   . Hypertension   . Lymphedema   . Obesity   . Persistent atrial fibrillation ([facility])     post-termination pauses with afib s/p PPM  . Presence of permanent cardiac pacemaker   . Sinoatrial node dysfunction ([facility])    s/p PPM  . Sleep apnea    compliant with CPAP  . Unspecified hypothyroidism   . Unspecified venous (peripheral) insufficiency     Past Surgical History:  Procedure Laterality  Date  . CATARACT EXTRACTION W/PHACO Left 09/09/2014   Procedure: CATARACT EXTRACTION PHACO AND INTRAOCULAR LENS PLACEMENT (IOC);  Surgeon: Tonny Branch, MD;  Location: [facility];  Service: Ophthalmology;  Laterality: Left;  CDE: 6.99  . CATARACT EXTRACTION W/PHACO Right 10/07/2014   Procedure: CATARACT EXTRACTION PHACO AND INTRAOCULAR LENS PLACEMENT RIGHT EYE;  Surgeon: Tonny Branch, MD;  Location: [facility];  Service: Ophthalmology;  Laterality: Right;  CDE:5.16  . PACEMAKER INSERTION     SJM by JA     reports that she quit smoking about 39 years ago. Her smoking use included cigarettes. She has a 5.00 pack-year smoking history. She has never used smokeless tobacco. She reports that she does not drink alcohol or use drugs.  No Known Allergies  Family History  Problem Relation Age of Onset  . Parkinson's disease Unknown   . CVA Unknown   . Parkinson's disease Father     Prior to Admission medications   Medication Sig Start Date End Date Taking? Authorizing Provider  acetaminophen (TYLENOL) 500 MG tablet Take 500 mg by mouth every 6 (six) hours as needed for mild pain or moderate pain.   Yes [provider]  ALPRAZolam (XANAX) 0.25 MG tablet Take 0.25 mg by mouth at bedtime as needed for anxiety or sleep.    Yes [provider]  atorvastatin (LIPITOR) 80 MG tablet Take 1 tablet (80 mg total) by mouth daily. 02/21/17  Yes Terald Sleeper, PA-C  diltiazem (  CARDIZEM CD) 240 MG 24 hr capsule Take 1 capsule (240 mg total) by mouth daily. 01/26/17  Yes Terald Sleeper, PA-C  levothyroxine (SYNTHROID, LEVOTHROID) 150 MCG tablet Take 1 tablet (150 mcg total) by mouth every morning. 02/21/17  Yes Terald Sleeper, PA-C  metoprolol tartrate (LOPRESSOR) 100 MG tablet TAKE ONE-HALF TABLET BY MOUTH TWICE DAILY 03/30/17  Yes Allred, Jeneen Rinks, MD  silver sulfADIAZINE (SILVADENE) 1 % cream Apply 1 application topically daily. 07/21/16  Yes Terald Sleeper, PA-C  warfarin (COUMADIN) 3 MG tablet TAKE 1 TABLET  BY MOUTH DAILY EXCEPT TAKE 1 AND 1/2 (ONE-HALF) TABLETS ON SUNDAYS, TUESDAYS AND THURSDAYS 04/08/17  Yes Allred, Jeneen Rinks, MD  clotrimazole-betamethasone (LOTRISONE) cream Apply 1 application topically daily as needed (for affected area(S)).    [provider]  Vitamin D, Ergocalciferol, (DRISDOL) 50000 units CAPS capsule Take 1 capsule (50,000 Units total) by mouth every 7 (seven) days. Takes on Thursdays. 02/22/17   Terald Sleeper, PA-C    Physical Exam: Vitals:   07/05/17 1127 07/05/17 1136 07/05/17 1223 07/05/17 1507  BP: (!) 150/65 (!) 150/65 131/70 (!) 155/67  Pulse: 100 92 (!) 104 88  Resp:  18 20 (!) 30  Temp:   98.4 F (36.9 C) 98.2 F (36.8 C)  TempSrc:   Oral Oral  SpO2: 96% 95% 95% 92%  Weight:      Height:        Constitutional: NAD, calm, comfortable Vitals:   07/05/17 1127 07/05/17 1136 07/05/17 1223 07/05/17 1507  BP: (!) 150/65 (!) 150/65 131/70 (!) 155/67  Pulse: 100 92 (!) 104 88  Resp:  18 20 (!) 30  Temp:   98.4 F (36.9 C) 98.2 F (36.8 C)  TempSrc:   Oral Oral  SpO2: 96% 95% 95% 92%  Weight:      Height:       Eyes: PERRL, lids and conjunctivae normal ENMT: Mucous membranes are moist. Posterior pharynx clear of any exudate or lesions.Normal dentition.  Neck: normal, supple, no masses, no thyromegaly Respiratory: Bilateral wheezes. Normal respiratory effort. No accessory muscle use.  Cardiovascular: Irregular rate and rhythm, no murmurs / rubs / gallops.  Lymphedema of lower extremities. 2+ pedal pulses. No carotid bruits.  Abdomen: no tenderness, no masses palpated. No hepatosplenomegaly. Bowel sounds positive.  Musculoskeletal: no clubbing / cyanosis. No joint deformity upper and lower extremities. Good ROM, no contractures. Normal muscle tone.  Skin: no rashes, lesions, ulcers. No induration Neurologic: CN 2-12 grossly intact. Sensation intact, DTR normal. Strength 5/5 in all 4.  Psychiatric: Normal judgment and insight. Alert and oriented x 3.  Normal mood.   Labs on Admission: I have personally reviewed following labs and imaging studies  CBC: Recent Labs  Lab 07/05/17 0741  WBC 8.8  NEUTROABS 7.2  HGB 13.8  HCT 42.4  MCV 94.2  PLT 213   Basic Metabolic Panel: Recent Labs  Lab 07/05/17 0741  NA 136  K 4.2  CL 100*  CO2 24  GLUCOSE 138*  BUN 16  CREATININE 0.79  CALCIUM 8.7*   GFR: Estimated Creatinine Clearance: 81.2 mL/min (by C-G formula based on SCr of 0.79 mg/dL). Liver Function Tests: Recent Labs  Lab 07/05/17 0741  AST 29  ALT 29  ALKPHOS 117  BILITOT 1.0  PROT 7.0  ALBUMIN 3.2*   No results for input(s): LIPASE, AMYLASE in the last 168 hours. No results for input(s): AMMONIA in the last 168 hours. Coagulation Profile: Recent Labs  Lab 07/05/17  0741  INR 1.73   Cardiac Enzymes: Recent Labs  Lab 07/05/17 0741  TROPONINI <0.03   BNP (last 3 results) No results for input(s): PROBNP in the last 8760 hours. HbA1C: No results for input(s): HGBA1C in the last 72 hours. CBG: No results for input(s): GLUCAP in the last 168 hours. Lipid Profile: No results for input(s): CHOL, HDL, LDLCALC, TRIG, CHOLHDL, LDLDIRECT in the last 72 hours. Thyroid Function Tests: No results for input(s): TSH, T4TOTAL, FREET4, T3FREE, THYROIDAB in the last 72 hours. Anemia Panel: No results for input(s): VITAMINB12, FOLATE, FERRITIN, TIBC, IRON, RETICCTPCT in the last 72 hours. Urine analysis:    Component Value Date/Time   COLORURINE YELLOW 07/05/2017 0919   APPEARANCEUR HAZY (A) 07/05/2017 0919   LABSPEC 1.025 07/05/2017 0919   PHURINE 5.0 07/05/2017 0919   GLUCOSEU NEGATIVE 07/05/2017 0919   HGBUR NEGATIVE 07/05/2017 0919   BILIRUBINUR NEGATIVE 07/05/2017 0919   KETONESUR NEGATIVE 07/05/2017 0919   PROTEINUR NEGATIVE 07/05/2017 0919   UROBILINOGEN 0.2 02/01/2014 1811   NITRITE NEGATIVE 07/05/2017 0919   LEUKOCYTESUR LARGE (A) 07/05/2017 0919    Radiological Exams on Admission: Dg Chest 2  View  Result Date: 07/05/2017 CLINICAL DATA:  Cough, weakness EXAM: CHEST - 2 VIEW COMPARISON:  08/17/2015 FINDINGS: Left pacer remains in place, unchanged. Low lung volumes with bibasilar atelectasis. Mild vascular congestion. Heart is borderline in size. No effusions. No acute bony abnormality. IMPRESSION: Low lung volumes, bibasilar atelectasis.  Mild vascular congestion. Electronically Signed   By: Rolm Baptise M.D.   On: 07/05/2017 08:29    EKG: Independently reviewed. Atrial fibrillation  Assessment/Plan Active Problems:   HYPERTENSION, BENIGN   ATRIAL FIBRILLATION   Morbid obesity due to excess calories (HCC)   OSA (obstructive sleep apnea)   Acquired hypothyroidism   Mixed hyperlipidemia   Bronchitis    1. Acute bronchitis.  Chest x-ray does not show any evidence of pneumonia.  Start the patient on bronchodilators, intravenous steroids and she is continuing to wheeze.  We will also give a course of azithromycin.  Continue pulmonary hygiene. 2. Generalized weakness.  Suspect is related to #1.  Physical therapy consultation. 3. Hypothyroidism.  Continue on Synthroid 4. Hyperlipidemia.  Continue on statin  5. Chronic atrial fibrillation.  Continue on beta-blockers and calcium channel blockers for rate control.  She is anticoagulated with Coumadin. 6. Morbid obesity.  Counseled on the importance of diet and exercise. 7. Obstructive sleep apnea.  Continue on CPAP.  DVT prophylaxis: warfarin Code Status: full code Family Communication: no family present, her son lives with her at home Disposition Plan: discharge home once respiratory status has improved Consults called:  Admission status: observation, tele   Kathie Dike MD [facility] Pager [phone]  If 7PM-7AM, please contact night-coverage [email or web address] Password [email or web address]  07/05/2017, 3:50 PM

## 2017-07-05 NOTE — ED Provider Notes (Signed)
[facility] Provider Note   CSN: [id number] Arrival date & time: 07/05/17  0277     History   Chief Complaint Chief Complaint  Patient presents with  . Weakness    HPI Megan [name] is a 72 y.o. female.  HPI 72 year old female comes in with chief complaint of weakness. Patient has past medical history of CHF, hypertension, hyperlipidemia, persistent A. fib on warfarin. Patient states that she started having cough and worsening shortness of breath with generalized weakness about 5 days ago.  Patient reports that her son has been sick with flulike symptoms.  Patient also has subjective fevers, chills, nausea.  Shortness of breath is worse with exertion and malaise has gotten worse.  Cough is producing thick, cream phlegm and patient is having subjective wheezing.   Past Medical History:  Diagnosis Date  . Acute diastolic heart failure ([place])   . Anxiety state, unspecified   . Congestive heart failure, unspecified   . Degenerative joint disease   . GERD (gastroesophageal reflux disease)   . Hyperlipidemia   . Hypertension   . Lymphedema   . Obesity   . Persistent atrial fibrillation (HCC)     post-termination pauses with afib s/p PPM  . Presence of permanent cardiac pacemaker   . Sinoatrial node dysfunction (HCC)    s/p PPM  . Sleep apnea    compliant with CPAP  . Unspecified hypothyroidism   . Unspecified venous (peripheral) insufficiency     Patient Active Problem List   Diagnosis Date Noted  . Bronchitis 07/05/2017  . Intertrigo 05/11/2017  . OAB (overactive bladder) 02/22/2017  . Lymphedema 01/26/2017  . Gastroesophageal reflux disease without esophagitis 01/26/2017  . Acquired hypothyroidism 01/26/2017  . Mixed hyperlipidemia 01/26/2017  . Pyrexia   . Sleeps in sitting position due to orthopnea 07/31/2015  . Acute on chronic systolic congestive heart failure ([place]) 06/09/2015  . Morbid obesity due to excess calories ([place]) 06/09/2015    . PVD (peripheral vascular disease) with claudication ([place]) 06/09/2015  . OSA (obstructive sleep apnea) 06/09/2015  . Long term (current) use of anticoagulants 06/19/2010  . Tachycardia-bradycardia syndrome ([place]) 11/18/2008  . PPM-[place] 11/18/2008  . Hyperlipidemia associated with type 2 diabetes mellitus ([place]) 02/21/2008  . HYPERTENSION, BENIGN 02/21/2008  . ATRIAL FIBRILLATION 02/21/2008    Past Surgical History:  Procedure Laterality Date  . CATARACT EXTRACTION W/PHACO Left 09/09/2014   Procedure: CATARACT EXTRACTION PHACO AND INTRAOCULAR LENS PLACEMENT (IOC);  Surgeon: Tonny Branch, MD;  Location: [facility];  Service: Ophthalmology;  Laterality: Left;  CDE: 6.99  . CATARACT EXTRACTION W/PHACO Right 10/07/2014   Procedure: CATARACT EXTRACTION PHACO AND INTRAOCULAR LENS PLACEMENT RIGHT EYE;  Surgeon: Tonny Branch, MD;  Location: [facility];  Service: Ophthalmology;  Laterality: Right;  CDE:5.16  . PACEMAKER INSERTION     SJM by JA     OB History   None      Home Medications    Prior to Admission medications   Medication Sig Start Date End Date Taking? Authorizing Provider  acetaminophen (TYLENOL) 500 MG tablet Take 500 mg by mouth every 6 (six) hours as needed for mild pain or moderate pain.   Yes [provider]  ALPRAZolam (XANAX) 0.25 MG tablet Take 0.25 mg by mouth at bedtime as needed for anxiety or sleep.    Yes [provider]  atorvastatin (LIPITOR) 80 MG tablet Take 1 tablet (80 mg total) by mouth daily. 02/21/17  Yes Terald Sleeper, PA-C  diltiazem (CARDIZEM CD) 240 MG 24 hr capsule Take 1 capsule (240 mg total) by mouth daily. 01/26/17  Yes Terald Sleeper, PA-C  levothyroxine (SYNTHROID, LEVOTHROID) 150 MCG tablet Take 1 tablet (150 mcg total) by mouth every morning. 02/21/17  Yes Terald Sleeper, PA-C  metoprolol tartrate (LOPRESSOR) 100 MG tablet TAKE ONE-HALF TABLET BY MOUTH TWICE DAILY 03/30/17  Yes Allred, Jeneen Rinks, MD  silver sulfADIAZINE (SILVADENE) 1 %  cream Apply 1 application topically daily. 07/21/16  Yes Terald Sleeper, PA-C  warfarin (COUMADIN) 3 MG tablet TAKE 1 TABLET BY MOUTH DAILY EXCEPT TAKE 1 AND 1/2 (ONE-HALF) TABLETS ON SUNDAYS, TUESDAYS AND THURSDAYS 04/08/17  Yes Allred, Jeneen Rinks, MD  clotrimazole-betamethasone (LOTRISONE) cream Apply 1 application topically daily as needed (for affected area(S)).    [provider]  Vitamin D, Ergocalciferol, (DRISDOL) 50000 units CAPS capsule Take 1 capsule (50,000 Units total) by mouth every 7 (seven) days. Takes on Thursdays. 02/22/17   Terald Sleeper, PA-C    Family History Family History  Problem Relation Age of Onset  . Parkinson's disease Unknown   . CVA Unknown   . Parkinson's disease Father     Social History Social History   Tobacco Use  . Smoking status: Former Smoker    Packs/day: 0.50    Years: 10.00    Pack years: 5.00    Types: Cigarettes    Last attempt to quit: 03/29/1978    Years since quitting: 39.2  . Smokeless tobacco: Never Used  Substance Use Topics  . Alcohol use: No    Alcohol/week: 0.0 oz  . Drug use: No     Allergies   Patient has no known allergies.   Review of Systems Review of Systems  Constitutional: Positive for activity change.  Respiratory: Positive for cough and shortness of breath.   Cardiovascular: Negative for chest pain.  Gastrointestinal: Negative for abdominal pain.  Skin: Negative for rash.  Allergic/Immunologic: Negative for immunocompromised state.  Hematological: Does not bruise/bleed easily.     Physical Exam Updated Vital Signs BP (!) 155/67 (BP Location: Left Wrist)   Pulse 88   Temp 98.2 F (36.8 C) (Oral)   Resp (!) 30   Ht 5\' 2"  (1.575 m)   Wt 130.2 kg (287 lb)   SpO2 92%   BMI 52.49 kg/m   Physical Exam  Constitutional: She is oriented to person, place, and time. She appears well-developed.  HENT:  Head: Normocephalic and atraumatic.  Eyes: EOM are normal.  Neck: Normal range of motion. Neck  supple.  Cardiovascular: Normal rate.  Pulmonary/Chest: Effort normal. No respiratory distress. She has wheezes.  Generalized wheezing with right-sided rhonchi  Abdominal: Soft. Bowel sounds are normal.  Neurological: She is alert and oriented to person, place, and time.  Skin: Skin is warm and dry.  Nursing note and vitals reviewed.    ED Treatments / Results  Labs (all labs ordered are listed, but only abnormal results are displayed) Labs Reviewed  COMPREHENSIVE METABOLIC PANEL - Abnormal; Notable for the following components:      Result Value   Chloride 100 (*)    Glucose, Bld 138 (*)    Calcium 8.7 (*)    Albumin 3.2 (*)    All other components within normal limits  URINALYSIS, ROUTINE W REFLEX MICROSCOPIC - Abnormal; Notable for the following components:   APPearance HAZY (*)    Leukocytes, UA LARGE (*)    Bacteria, UA RARE (*)    Squamous Epithelial /  LPF 0-5 (*)    All other components within normal limits  PROTIME-INR - Abnormal; Notable for the following components:   Prothrombin Time 20.1 (*)    All other components within normal limits  BRAIN NATRIURETIC PEPTIDE - Abnormal; Notable for the following components:   B Natriuretic Peptide 135.0 (*)    All other components within normal limits  CULTURE, BLOOD (ROUTINE X 2)  CULTURE, BLOOD (ROUTINE X 2)  CBC WITH DIFFERENTIAL/PLATELET  APTT  TROPONIN I  INFLUENZA PANEL BY PCR (TYPE A & B)  I-STAT CG4 LACTIC ACID, ED    EKG EKG Interpretation  Date/Time:  Tuesday July 05 2017 06:27:50 EDT Ventricular Rate:  119 PR Interval:    QRS Duration: 114 QT Interval:  327 QTC Calculation: 437 R Axis:   56 Text Interpretation:  Atrial fibrillation IRBBB and LPFB Low voltage, precordial leads Nonspecific T abnormalities, lateral leads Since last tracing rate faster Confirmed by Rolland Porter [phone]) on 07/05/2017 6:35:26 AM   Radiology Dg Chest 2 View  Result Date: 07/05/2017 CLINICAL DATA:  Cough, weakness EXAM: CHEST  - 2 VIEW COMPARISON:  08/17/2015 FINDINGS: Left pacer remains in place, unchanged. Low lung volumes with bibasilar atelectasis. Mild vascular congestion. Heart is borderline in size. No effusions. No acute bony abnormality. IMPRESSION: Low lung volumes, bibasilar atelectasis.  Mild vascular congestion. Electronically Signed   By: Rolm Baptise M.D.   On: 07/05/2017 08:29    Procedures Procedures (including critical care time)  Medications Ordered in ED Medications - No data to display   Initial Impression / Assessment and Plan / ED Course  I have reviewed the triage vital signs and the nursing notes.  Pertinent labs & imaging results that were available during my care of the patient were reviewed by me and considered in my medical decision making (see chart for details).  Clinical Course as of Jul 06 1539  Tue Jul 05, 2017  0845 No infiltrate seen. WC is normal. Lactate is normal. Flu pending. Unlikely bacterial process.   [AN]  0932 Influenza is negative. Ambulatory pulsox revealed O2 sats maintaining in the 92-94% range. Patient does have mild wheezing.  Heart rate continues to be elevated, and respiratory rate goes up to the upper 20s with ambulation. I suspect that she might be having a viral bronchitis.  BNP also ordered to ensure there is no acute CHF given the vascular congestion on chest x-ray.  Patient feels weak and does not feel comfortable going home. Disposition is pending at this time based on further evaluation.  Influenza panel by PCR (type A & B) [AN]  1540 Patient's UA was equivocal. Patient continued to feel weak, and not comfortable going home.  I requested medicine doctors to see if they would admit her as an observation status given her weakness and difficulty in ambulation with O2 sats in the 93%.  I suspect that patient has a viral bronchitis.  Medicine is graciously accepting the patient..   [AN]    Clinical Course User Index [AN] Varney Biles, MD     72 year old female comes in with chief complaint of weakness and shortness of breath with a productive cough.  Patient is also having subjective fevers.  On my exam she does have abnormal lung exam and she is noted to have O2 sats of 92%.  I am suspicious of upper respiratory infection, with a questionable pneumonia versus upper respiratory infection that is viral in etiology.  Basic labs and chest x-ray has been ordered.  Final Clinical Impressions(s) / ED Diagnoses   Final diagnoses:  Generalized weakness  Pulmonary vascular congestion  Weakness    ED Discharge Orders    None       Varney Biles, MD 07/05/17 1541

## 2017-07-06 ENCOUNTER — Encounter (HOSPITAL_COMMUNITY): Payer: Self-pay

## 2017-07-06 DIAGNOSIS — G4733 Obstructive sleep apnea (adult) (pediatric): Secondary | ICD-10-CM | POA: Diagnosis not present

## 2017-07-06 DIAGNOSIS — J40 Bronchitis, not specified as acute or chronic: Secondary | ICD-10-CM | POA: Diagnosis not present

## 2017-07-06 DIAGNOSIS — I10 Essential (primary) hypertension: Secondary | ICD-10-CM | POA: Diagnosis not present

## 2017-07-06 DIAGNOSIS — E039 Hypothyroidism, unspecified: Secondary | ICD-10-CM | POA: Diagnosis not present

## 2017-07-06 DIAGNOSIS — R531 Weakness: Secondary | ICD-10-CM | POA: Diagnosis not present

## 2017-07-06 DIAGNOSIS — E782 Mixed hyperlipidemia: Secondary | ICD-10-CM | POA: Diagnosis not present

## 2017-07-06 DIAGNOSIS — I4891 Unspecified atrial fibrillation: Secondary | ICD-10-CM | POA: Diagnosis not present

## 2017-07-06 LAB — BLOOD CULTURE ID PANEL (REFLEXED)
ACINETOBACTER BAUMANNII: NOT DETECTED
CANDIDA ALBICANS: NOT DETECTED
Candida glabrata: NOT DETECTED
Candida krusei: NOT DETECTED
Candida parapsilosis: NOT DETECTED
Candida tropicalis: NOT DETECTED
ENTEROBACTERIACEAE SPECIES: NOT DETECTED
Enterobacter cloacae complex: NOT DETECTED
Enterococcus species: NOT DETECTED
Escherichia coli: NOT DETECTED
Haemophilus influenzae: NOT DETECTED
Klebsiella oxytoca: NOT DETECTED
Klebsiella pneumoniae: NOT DETECTED
Listeria monocytogenes: NOT DETECTED
METHICILLIN RESISTANCE: DETECTED — AB
NEISSERIA MENINGITIDIS: NOT DETECTED
PSEUDOMONAS AERUGINOSA: NOT DETECTED
Proteus species: NOT DETECTED
STAPHYLOCOCCUS AUREUS BCID: NOT DETECTED
STREPTOCOCCUS PNEUMONIAE: NOT DETECTED
STREPTOCOCCUS PYOGENES: NOT DETECTED
STREPTOCOCCUS SPECIES: NOT DETECTED
Serratia marcescens: NOT DETECTED
Staphylococcus species: DETECTED — AB
Streptococcus agalactiae: NOT DETECTED

## 2017-07-06 LAB — CBC
HCT: 42.9 % (ref 36.0–46.0)
HEMOGLOBIN: 14 g/dL (ref 12.0–15.0)
MCH: 30.7 pg (ref 26.0–34.0)
MCHC: 32.6 g/dL (ref 30.0–36.0)
MCV: 94.1 fL (ref 78.0–100.0)
Platelets: 232 10*3/uL (ref 150–400)
RBC: 4.56 MIL/uL (ref 3.87–5.11)
RDW: 15.4 % (ref 11.5–15.5)
WBC: 6.7 10*3/uL (ref 4.0–10.5)

## 2017-07-06 LAB — BASIC METABOLIC PANEL
ANION GAP: 13 (ref 5–15)
BUN: 14 mg/dL (ref 6–20)
CHLORIDE: 103 mmol/L (ref 101–111)
CO2: 22 mmol/L (ref 22–32)
Calcium: 8.6 mg/dL — ABNORMAL LOW (ref 8.9–10.3)
Creatinine, Ser: 0.75 mg/dL (ref 0.44–1.00)
GFR calc non Af Amer: >60 mL/min (ref >60)
GLUCOSE: 174 mg/dL — AB (ref 65–99)
Potassium: 4.2 mmol/L (ref 3.5–5.1)
Sodium: 138 mmol/L (ref 135–145)

## 2017-07-06 LAB — PROTIME-INR
INR: 2.17
Prothrombin Time: 24 seconds — ABNORMAL HIGH (ref 11.4–15.2)

## 2017-07-06 MED ORDER — VANCOMYCIN HCL IN DEXTROSE 1-5 GM/200ML-% IV SOLN
1000.0000 mg | INTRAVENOUS | Status: AC
  Administered 2017-07-06 (×2): 1000 mg via INTRAVENOUS
  Filled 2017-07-06 (×2): qty 200

## 2017-07-06 MED ORDER — VANCOMYCIN HCL 10 G IV SOLR
1250.0000 mg | Freq: Two times a day (BID) | INTRAVENOUS | Status: DC
  Administered 2017-07-06 – 2017-07-07 (×2): 1250 mg via INTRAVENOUS
  Filled 2017-07-06 (×4): qty 1250

## 2017-07-06 MED ORDER — WARFARIN SODIUM 2.5 MG PO TABS
2.5000 mg | ORAL_TABLET | Freq: Once | ORAL | Status: AC
  Administered 2017-07-06: 2.5 mg via ORAL
  Filled 2017-07-06: qty 1

## 2017-07-06 MED ORDER — METOPROLOL TARTRATE 25 MG PO TABS
25.0000 mg | ORAL_TABLET | ORAL | Status: AC
  Administered 2017-07-06: 25 mg via ORAL
  Filled 2017-07-06: qty 1

## 2017-07-06 MED ORDER — PREDNISONE 20 MG PO TABS
40.0000 mg | ORAL_TABLET | Freq: Every day | ORAL | Status: DC
  Administered 2017-07-07: 40 mg via ORAL
  Filled 2017-07-06: qty 2

## 2017-07-06 MED ORDER — METOPROLOL TARTRATE 50 MG PO TABS
75.0000 mg | ORAL_TABLET | Freq: Two times a day (BID) | ORAL | Status: DC
  Administered 2017-07-06 – 2017-07-07 (×2): 75 mg via ORAL
  Filled 2017-07-06 (×2): qty 1

## 2017-07-06 NOTE — Progress Notes (Signed)
ANTIBIOTIC CONSULT NOTE-Preliminary  Pharmacy Consult for vancomycin Indication: bacteremia  No Known Allergies  Patient Measurements: Height: 5\' 2"  (157.5 cm) Weight: 287 lb (130.2 kg) IBW/kg (Calculated) : 50.1 Adjusted Body Weight:   Vital Signs: Temp: 98.6 F (37 C) (04/10 0336) Temp Source: Oral (04/10 0336) BP: 149/71 (04/10 0336) Pulse Rate: 102 (04/10 0336)  Labs: Recent Labs    07/05/17 0741  WBC 8.8  HGB 13.8  PLT 243  CREATININE 0.79    Estimated Creatinine Clearance: 81.2 mL/min (by C-G formula based on SCr of 0.79 mg/dL).  No results for input(s): VANCOTROUGH, VANCOPEAK, VANCORANDOM, GENTTROUGH, GENTPEAK, GENTRANDOM, TOBRATROUGH, TOBRAPEAK, TOBRARND, AMIKACINPEAK, AMIKACINTROU, AMIKACIN in the last 72 hours.   Microbiology: Recent Results (from the past 720 hour(s))  Blood Culture (routine x 2)     Status: None (Preliminary result)   Collection Time: 07/05/17  7:41 AM  Result Value Ref Range Status   Specimen Description BLOOD LEFT ARM  Final   Special Requests   Final    BOTTLES DRAWN AEROBIC AND ANAEROBIC Blood Culture adequate volume Performed at [facility], [address] [place], [place] [address]    Culture PENDING  Incomplete   Report Status PENDING  Incomplete  Blood Culture (routine x 2)     Status: None (Preliminary result)   Collection Time: 07/05/17  8:02 AM  Result Value Ref Range Status   Specimen Description BLOOD LEFT ARM  Final   Special Requests   Final    BOTTLES DRAWN AEROBIC AND ANAEROBIC Blood Culture adequate volume   Culture  Setup Time   Final    GRAM POSITIVE COCCI AEROBIC BOTTLE ONLY Gram Stain Report Called to,Read Back By and Verified With: H.TETREAULT, RN @ [phone] ON 4.10.19 BY L.BOWMAN Performed at [facility], [address] [place], [place] [address]    Culture PENDING  Incomplete   Report Status PENDING  Incomplete    Medical History: Past Medical History:  Diagnosis Date  . Acute diastolic heart failure  ([place])   . Anxiety state, unspecified   . Congestive heart failure, unspecified   . Degenerative joint disease   . GERD (gastroesophageal reflux disease)   . Hyperlipidemia   . Hypertension   . Lymphedema   . Obesity   . Persistent atrial fibrillation (HCC)     post-termination pauses with afib s/p PPM  . Presence of permanent cardiac pacemaker   . Sinoatrial node dysfunction (HCC)    s/p PPM  . Sleep apnea    compliant with CPAP  . Unspecified hypothyroidism   . Unspecified venous (peripheral) insufficiency     Medications:  Anti-infectives (From admission, onward)   Start     Dose/Rate Route Frequency Ordered Stop   07/06/17 1000  azithromycin (ZITHROMAX) tablet 250 mg     250 mg Oral Daily 07/05/17 1549 07/10/17 0959   07/06/17 0545  vancomycin (VANCOCIN) IVPB 1000 mg/200 mL premix     1,000 mg 200 mL/hr over 60 Minutes Intravenous Every 1 hr x 2 07/06/17 0531 07/06/17 0744   07/05/17 1600  azithromycin (ZITHROMAX) tablet 500 mg     500 mg Oral Daily 07/05/17 1549 07/05/17 1639      Assessment: 72 yo female presented to ED with weakness, cough, and SOB.  Blood cx shows GPC, starting vancomycin  Goal of Therapy:  Vancomycin trough level 15-20 mcg/ml  Plan:  Preliminary review of pertinent patient information completed.  Protocol will be initiated with dose(s) of vancomycin 1gram Q1 x2  doses.  Forestine Na clinical pharmacist will complete review during morning rounds to assess patient and finalize treatment regimen if needed.  Megan [name], RPH 07/06/2017,5:36 AM

## 2017-07-06 NOTE — Progress Notes (Signed)
CRITICAL VALUE ALERT  Critical Value:  Aerobic blood culture positive for gram positive cocci  Date & Time Notied:  07/06/17, 0445  Provider Notified: [place]  Orders Received/Actions taken:

## 2017-07-06 NOTE — Plan of Care (Signed)
  Problem: Acute Rehab PT Goals(only PT should resolve) Goal: Pt Will Go Supine/Side To Sit Outcome: Progressing Flowsheets (Taken 07/06/2017 1426) Pt will go Supine/Side to Sit: with modified independence Goal: Patient Will Transfer Sit To/From Stand Outcome: Progressing Flowsheets (Taken 07/06/2017 1426) Patient will transfer sit to/from stand: with modified independence Goal: Pt Will Transfer Bed To Chair/Chair To Bed Outcome: Progressing Flowsheets (Taken 07/06/2017 1426) Pt will Transfer Bed to Chair/Chair to Bed: with modified independence Goal: Pt Will Ambulate Outcome: Progressing Flowsheets (Taken 07/06/2017 1426) Pt will Ambulate: with modified independence;50 feet;with rolling walker;with cane  2:27 PM, 07/06/17 Lonell Grandchild, MPT Physical Therapist with [facility] [phone] office [phone] mobile phone

## 2017-07-06 NOTE — Progress Notes (Addendum)
PROGRESS NOTE    Megan [name]  FAO:[id number] DOB: 1946/02/22 DOA: 07/05/2017 PCP: Theodoro Clock    Brief Narrative:  72 year old female admitted to the hospital with shortness of breath.  Found to have acute bronchitis requiring IV steroids, bronchodilators and antibiotics.  She also has chronic atrial fibrillation and developed rapid ventricular response.  Overall respiratory status has improved.  Her heart rate control medications are being adjusted for tachycardia.  Plan is to discharge home once heart rate is in reasonable range.   Assessment & Plan:   Active Problems:   HYPERTENSION, BENIGN   ATRIAL FIBRILLATION   Morbid obesity due to excess calories (HCC)   OSA (obstructive sleep apnea)   Acquired hypothyroidism   Mixed hyperlipidemia   Bronchitis   1. Acute bronchitis.  No evidence of pneumonia on chest x-ray.  Improving with steroids, bronchodilators and antibiotics.  Transition to prednisone taper. 2. Chronic atrial fibrillation.  Heart rate currently in the 115-120 range at rest, increases to 170 on exertion.  Will increase metoprolol dosing and monitor.  She is anticoagulated with Coumadin. 3. Generalized weakness.  Seen by physical therapy.  Recommended home health physical therapy, although patient is refusing this. 4. Hypothyroidism.  Continue Synthroid 5. Hyperlipidemia.  Continue statin 6. Obstructive sleep apnea.  Continue CPAP 7. Morbid obesity.  Counseled on the importance of diet and exercise. 8. Positive blood culture.  Found to have 1 out of 4 positive blood cultures for gram-positive cocci.  Suspect this is contaminant.   DVT prophylaxis: Warfarin Code Status: Full code Family Communication: No family present Disposition Plan: Discharge home once heart rate is controlled  Consultants:     Procedures:     Antimicrobials:   Vancomycin 4/10>  Azithromycin 4/9 >   Subjective: Feels better today.  Shortness of breath is better.   Still feels weak.  No chest pain.  Objective: Vitals:   07/06/17 0151 07/06/17 0336 07/06/17 1407 07/06/17 1423  BP:  (!) 149/71 (!) 149/80   Pulse:  (!) 102 66   Resp:  20 18   Temp:  98.6 F (37 C) 98.4 F (36.9 C)   TempSrc:  Oral Oral   SpO2: 90% 97% 97% 92%  Weight:      Height:        Intake/Output Summary (Last 24 hours) at 07/06/2017 1924 Last data filed at 07/06/2017 1744 Gross per 24 hour  Intake 1420 ml  Output -  Net 1420 ml   Filed Weights   07/05/17 0622  Weight: 130.2 kg (287 lb)    Examination:  General exam: Appears calm and comfortable  Respiratory system: Mild wheeze bilaterally. Respiratory effort normal. Cardiovascular system: S1 & S2 heard, irregular. No JVD, murmurs, rubs, gallops or clicks. No pedal edema. Gastrointestinal system: Abdomen is nondistended, soft and nontender. No organomegaly or masses felt. Normal bowel sounds heard. Central nervous system: Alert and oriented. No focal neurological deficits. Extremities: Symmetric 5 x 5 power. Skin: No rashes, lesions or ulcers Psychiatry: Judgement and insight appear normal. Mood & affect appropriate.     Data Reviewed: I have personally reviewed following labs and imaging studies  CBC: Recent Labs  Lab 07/05/17 0741 07/06/17 0535  WBC 8.8 6.7  NEUTROABS 7.2  --   HGB 13.8 14.0  HCT 42.4 42.9  MCV 94.2 94.1  PLT 243 696   Basic Metabolic Panel: Recent Labs  Lab 07/05/17 0741 07/06/17 0535  NA 136 138  K 4.2 4.2  CL  100* 103  CO2 24 22  GLUCOSE 138* 174*  BUN 16 14  CREATININE 0.79 0.75  CALCIUM 8.7* 8.6*   GFR: Estimated Creatinine Clearance: 81.2 mL/min (by C-G formula based on SCr of 0.75 mg/dL). Liver Function Tests: Recent Labs  Lab 07/05/17 0741  AST 29  ALT 29  ALKPHOS 117  BILITOT 1.0  PROT 7.0  ALBUMIN 3.2*   No results for input(s): LIPASE, AMYLASE in the last 168 hours. No results for input(s): AMMONIA in the last 168 hours. Coagulation  Profile: Recent Labs  Lab 07/05/17 0741 07/06/17 0535  INR 1.73 2.17   Cardiac Enzymes: Recent Labs  Lab 07/05/17 0741  TROPONINI <0.03   BNP (last 3 results) No results for input(s): PROBNP in the last 8760 hours. HbA1C: No results for input(s): HGBA1C in the last 72 hours. CBG: No results for input(s): GLUCAP in the last 168 hours. Lipid Profile: No results for input(s): CHOL, HDL, LDLCALC, TRIG, CHOLHDL, LDLDIRECT in the last 72 hours. Thyroid Function Tests: No results for input(s): TSH, T4TOTAL, FREET4, T3FREE, THYROIDAB in the last 72 hours. Anemia Panel: No results for input(s): VITAMINB12, FOLATE, FERRITIN, TIBC, IRON, RETICCTPCT in the last 72 hours. Sepsis Labs: Recent Labs  Lab 07/05/17 0747  LATICACIDVEN 1.31    Recent Results (from the past 240 hour(s))  Blood Culture (routine x 2)     Status: None (Preliminary result)   Collection Time: 07/05/17  7:41 AM  Result Value Ref Range Status   Specimen Description BLOOD LEFT ARM  Final   Special Requests   Final    BOTTLES DRAWN AEROBIC AND ANAEROBIC Blood Culture adequate volume   Culture   Final    NO GROWTH < 24 HOURS Performed at [facility], [address] [place], [place] [address]    Report Status PENDING  Incomplete  Blood Culture (routine x 2)     Status: None (Preliminary result)   Collection Time: 07/05/17  8:02 AM  Result Value Ref Range Status   Specimen Description   Final    BLOOD LEFT ARM Performed at [facility], [address], [place], [place] [address]    Special Requests   Final    BOTTLES DRAWN AEROBIC AND ANAEROBIC Blood Culture adequate volume Performed at [facility], [address], [place], [place] [address]    Culture  Setup Time   Final    GRAM POSITIVE COCCI AEROBIC BOTTLE ONLY Gram Stain Report Called to,Read Back By and Verified With: H.TETREAULT, RN @ 6962 ON 4.10.19 BY L.BOWMAN    Culture GRAM POSITIVE COCCI  Final   Report Status PENDING  Incomplete  Blood Culture  ID Panel (Reflexed)     Status: Abnormal   Collection Time: 07/05/17  8:02 AM  Result Value Ref Range Status   Enterococcus species NOT DETECTED NOT DETECTED Final   Listeria monocytogenes NOT DETECTED NOT DETECTED Final   Staphylococcus species DETECTED (A) NOT DETECTED Final    Comment: Methicillin (oxacillin) resistant coagulase negative staphylococcus. Possible blood culture contaminant (unless isolated from more than one blood culture draw or clinical case suggests pathogenicity). No antibiotic treatment is indicated for blood  culture contaminants. CRITICAL RESULT CALLED TO, READ BACK BY AND VERIFIED WITH: MARY SWAYNE PHARMD AT 0931 ON [id number] BY SJW    Staphylococcus aureus NOT DETECTED NOT DETECTED Final   Methicillin resistance DETECTED (A) NOT DETECTED Final    Comment: CRITICAL RESULT CALLED TO, READ BACK BY AND VERIFIED WITH: Theodis Shove PHARMD AT  0931 ON [id number] BY SJW    Streptococcus species NOT DETECTED NOT DETECTED Final   Streptococcus agalactiae NOT DETECTED NOT DETECTED Final   Streptococcus pneumoniae NOT DETECTED NOT DETECTED Final   Streptococcus pyogenes NOT DETECTED NOT DETECTED Final   Acinetobacter baumannii NOT DETECTED NOT DETECTED Final   Enterobacteriaceae species NOT DETECTED NOT DETECTED Final   Enterobacter cloacae complex NOT DETECTED NOT DETECTED Final   Escherichia coli NOT DETECTED NOT DETECTED Final   Klebsiella oxytoca NOT DETECTED NOT DETECTED Final   Klebsiella pneumoniae NOT DETECTED NOT DETECTED Final   Proteus species NOT DETECTED NOT DETECTED Final   Serratia marcescens NOT DETECTED NOT DETECTED Final   Haemophilus influenzae NOT DETECTED NOT DETECTED Final   Neisseria meningitidis NOT DETECTED NOT DETECTED Final   Pseudomonas aeruginosa NOT DETECTED NOT DETECTED Final   Candida albicans NOT DETECTED NOT DETECTED Final   Candida glabrata NOT DETECTED NOT DETECTED Final   Candida krusei NOT DETECTED NOT DETECTED Final   Candida parapsilosis  NOT DETECTED NOT DETECTED Final   Candida tropicalis NOT DETECTED NOT DETECTED Final    Comment: Performed at [facility], [place] [address], [place], [place] [address]         Radiology Studies: Dg Chest 2 View  Result Date: 07/05/2017 CLINICAL DATA:  Cough, weakness EXAM: CHEST - 2 VIEW COMPARISON:  08/17/2015 FINDINGS: Left pacer remains in place, unchanged. Low lung volumes with bibasilar atelectasis. Mild vascular congestion. Heart is borderline in size. No effusions. No acute bony abnormality. IMPRESSION: Low lung volumes, bibasilar atelectasis.  Mild vascular congestion. Electronically Signed   By: Rolm Baptise M.D.   On: 07/05/2017 08:29        Scheduled Meds: . atorvastatin  80 mg Oral q1800  . azithromycin  250 mg Oral Daily  . benzonatate  200 mg Oral TID  . diltiazem  240 mg Oral Daily  . guaiFENesin  1,200 mg Oral BID  . ipratropium  0.5 mg Nebulization Q6H  . levalbuterol  0.63 mg Nebulization Q6H  . levothyroxine  150 mcg Oral BH-q7a  . methylPREDNISolone (SOLU-MEDROL) injection  60 mg Intravenous Q12H  . metoprolol tartrate  75 mg Oral BID  . Warfarin - Pharmacist Dosing Inpatient   Does not apply Q24H   Continuous Infusions: . vancomycin 1,250 mg (07/06/17 1721)     LOS: 0 days    Kathie Dike, MD [facility] Pager [phone]  If 7PM-7AM, please contact night-coverage [email or web address] Password [email or web address] 07/06/2017, 7:24 PM

## 2017-07-06 NOTE — Progress Notes (Signed)
ANTIBIOTIC CONSULT NOTE  Pharmacy Consult for vancomycin Indication: bacteremia  No Known Allergies  Patient Measurements: Height: 5\' 2"  (157.5 cm) Weight: 287 lb (130.2 kg) IBW/kg (Calculated) : 50.1  Vital Signs: Temp: 98.6 F (37 C) (04/10 0336) Temp Source: Oral (04/10 0336) BP: 149/71 (04/10 0336) Pulse Rate: 102 (04/10 0336)  Labs: Recent Labs    07/05/17 0741 07/06/17 0535  WBC 8.8 6.7  HGB 13.8 14.0  PLT 243 232  CREATININE 0.79 0.75   Estimated Creatinine Clearance: 81.2 mL/min (by C-G formula based on SCr of 0.75 mg/dL).  No results for input(s): VANCOTROUGH, VANCOPEAK, VANCORANDOM, GENTTROUGH, GENTPEAK, GENTRANDOM, TOBRATROUGH, TOBRAPEAK, TOBRARND, AMIKACINPEAK, AMIKACINTROU, AMIKACIN in the last 72 hours.   Microbiology: Recent Results (from the past 720 hour(s))  Blood Culture (routine x 2)     Status: None (Preliminary result)   Collection Time: 07/05/17  7:41 AM  Result Value Ref Range Status   Specimen Description BLOOD LEFT ARM  Final   Special Requests   Final    BOTTLES DRAWN AEROBIC AND ANAEROBIC Blood Culture adequate volume   Culture   Final    NO GROWTH < 24 HOURS Performed at [facility], [address] [place], [place] [address]    Report Status PENDING  Incomplete  Blood Culture (routine x 2)     Status: None (Preliminary result)   Collection Time: 07/05/17  8:02 AM  Result Value Ref Range Status   Specimen Description   Final    BLOOD LEFT ARM Performed at [facility], [address], [place], [place] [address]    Special Requests   Final    BOTTLES DRAWN AEROBIC AND ANAEROBIC Blood Culture adequate volume Performed at [facility], [address] [place], [place] [address]    Culture  Setup Time   Final    GRAM POSITIVE COCCI AEROBIC BOTTLE ONLY Gram Stain Report Called to,Read Back By and Verified With: H.TETREAULT, RN @ 5009 ON 4.10.19 BY L.BOWMAN Organism ID to follow Performed at [facility], [place] [address]  [place], [place] [address]    Culture PENDING  Incomplete   Report Status PENDING  Incomplete   Medical History: Past Medical History:  Diagnosis Date  . Acute diastolic heart failure ([place])   . Anxiety state, unspecified   . Congestive heart failure, unspecified   . Degenerative joint disease   . GERD (gastroesophageal reflux disease)   . Hyperlipidemia   . Hypertension   . Lymphedema   . Obesity   . Persistent atrial fibrillation ([facility])     post-termination pauses with afib s/p PPM  . Presence of permanent cardiac pacemaker   . Sinoatrial node dysfunction ([facility])    s/p PPM  . Sleep apnea    compliant with CPAP  . Unspecified hypothyroidism   . Unspecified venous (peripheral) insufficiency    Medications:  Anti-infectives (From admission, onward)   Start     Dose/Rate Route Frequency Ordered Stop   07/06/17 1800  vancomycin (VANCOCIN) 1,250 mg in sodium chloride 0.9 % 250 mL IVPB     1,250 mg 166.7 mL/hr over 90 Minutes Intravenous Every 12 hours 07/06/17 0736     07/06/17 1000  azithromycin (ZITHROMAX) tablet 250 mg     250 mg Oral Daily 07/05/17 1549 07/10/17 0959   07/06/17 0545  vancomycin (VANCOCIN) IVPB 1000 mg/200 mL premix     1,000 mg 200 mL/hr over 60 Minutes Intravenous Every 1 hr x 2 07/06/17 0531 07/06/17 0844  07/05/17 1600  azithromycin (ZITHROMAX) tablet 500 mg     500 mg Oral Daily 07/05/17 1549 07/05/17 1639     Assessment: 72 yo female presented to ED with weakness, cough, and SOB.  Blood cx shows GPC, starting vancomycin  Goal of Therapy:  Vancomycin trough level 15-20 mcg/ml  Plan: Vancomycin 2gm x 1 then 1250mg  IV q12hrs Check trough at steady state Monitor labs, progress, c/s, LOT   [employer] A, RPH 07/06/2017,9:10 AM

## 2017-07-06 NOTE — Care Management Obs Status (Signed)
[facility] NOTIFICATION   Patient Details  Name: Megan [name] MRN: [id number] Date of Birth: 10/09/1945   Medicare Observation Status Notification Given:  Yes    Itza Maniaci, Chauncey Reading, RN 07/06/2017, 8:55 AM

## 2017-07-06 NOTE — Care Management Note (Signed)
Case Management Note  Patient Details  Name: Megan [name] MRN: [id number] Date of Birth: 21-Nov-1945  Subjective/Objective:  Adm with bronchitis. From home, lives with son. Ind with ADL's. Walks with cane. Has PCP, son drives her to appt. Has insurance with prescription coverage. No oxygen or neb machine at home. Currently on room air, eating breakfast. We discuss her PT eval today. She reports she is not interested in any home health even if recommended as her son works third shift and does not want any visitors during the day.                   Action/Plan: DC home with self care.   Expected Discharge Date:    07/06/2017              Expected Discharge Plan:  Home/Self Care  In-House Referral:     Discharge planning Services  CM Consult  Post Acute Care Choice:    Choice offered to:  Patient  DME Arranged:    DME Agency:     HH Arranged:  Patient Refused [place] Agency:     Status of Service:  Completed, signed off  If discussed at [employer] of Stay Meetings, dates discussed:    Additional Comments:  Ripley Lovecchio, Chauncey Reading, RN 07/06/2017, 9:00 AM

## 2017-07-06 NOTE — Evaluation (Signed)
Physical Therapy Evaluation Patient Details Name: Megan [name] MRN: [id number] DOB: 1945-07-15 Today's Date: 07/06/2017   History of Present Illness  Megan [name] is a 72 y.o. female with medical history significant of atrial fibrillation on chronic anticoagulation, hypothyroidism, hypertension, hyperlipidemia, presents to the hospital with complaints of shortness of breath.  Patient had onset of symptoms approximately 1 week ago.  She has had progressive dyspnea, cough productive of thick yellow colored sputum.  She has been feverish and reports a T-max of 100.5.  She reports having some vomiting and diarrhea but has not had any in the past 48 hours.  She has nausea with loss of appetite.  She feels generally weak.  Reports that her son has similar symptoms.  No chest pain or dysuria..    Clinical Impression  Patient functioning near baseline for functional mobility and gait other than demonstrating slightly labored movement for sitting up and taking steps due to generalized weakness, no loss of balance, but HR increased to 170 BPM with exertion per telementry and patient c/o fatigue during gait training.  Patient tolerated sitting in chair after therapy - RN notified.  Patient will benefit from continued physical therapy in hospital and recommended venue below to increase strength, balance, endurance for safe ADLs and gait.     Follow Up Recommendations Home health PT;Supervision - Intermittent(Patient states she does not want HHPT due not wanting to wake her son who works night shift)    Equipment Recommendations  None recommended by PT    Recommendations for Other Services       Precautions / Restrictions Precautions Precautions: Fall Restrictions Weight Bearing Restrictions: No      Mobility  Bed Mobility Overal bed mobility: Needs Assistance Bed Mobility: Supine to Sit     Supine to sit: Supervision        Transfers Overall transfer level: Needs  assistance Equipment used: Rolling walker (2 wheeled) Transfers: Sit to/from [facility] Sit to Stand: Supervision Stand pivot transfers: Supervision          Ambulation/Gait Ambulation/Gait assistance: Min guard Ambulation Distance (Feet): 30 Feet Assistive device: Rolling walker (2 wheeled) Gait Pattern/deviations: Decreased step length - right;Decreased step length - left;Decreased stride length Gait velocity: decreased Gait velocity interpretation: Below normal speed for age/gender General Gait Details: demonstrates slightly labored slow cadence without loss of balance, limited secondary to fatigue, and RN stated telemetry reported HR increased to 170 BPM  Stairs            Wheelchair Mobility    Modified Rankin (Stroke Patients Only)       Balance Overall balance assessment: Needs assistance Sitting-balance support: Feet supported;No upper extremity supported Sitting balance-Leahy Scale: Good     Standing balance support: Bilateral upper extremity supported;During functional activity Standing balance-Leahy Scale: Fair Standing balance comment: with RW                             Pertinent Vitals/Pain Pain Assessment: No/denies pain    Home Living Family/patient expects to be discharged to:: Private residence Living Arrangements: Children(son) Available Help at Discharge: Family Type of Home: House Home Access: Stairs to enter Entrance Stairs-Rails: Right Entrance Stairs-Number of Steps: 5 Home Layout: One level Home Equipment: Walker - 2 wheels;Cane - single point;Shower seat;Bedside commode;Wheelchair - manual      Prior Function Level of Independence: Independent with assistive device(s)         Comments: [employer]  ambulator with [facility]     Hand Dominance        Extremity/Trunk Assessment   Upper Extremity Assessment Upper Extremity Assessment: Generalized weakness    Lower Extremity Assessment Lower  Extremity Assessment: Generalized weakness    Cervical / Trunk Assessment Cervical / Trunk Assessment: Normal  Communication   Communication: No difficulties  Cognition Arousal/Alertness: Awake/alert Behavior During Therapy: WFL for tasks assessed/performed Overall Cognitive Status: Within Functional Limits for tasks assessed                                        General Comments      Exercises     Assessment/Plan    PT Assessment Patient needs continued PT services  PT Problem List Decreased strength;Decreased activity tolerance;Decreased balance;Decreased mobility       PT Treatment Interventions Gait training;Functional mobility training;Therapeutic activities;Therapeutic exercise;Stair training;Patient/family education    PT Goals (Current goals can be found in the Care Plan section)  Acute Rehab PT Goals Patient Stated Goal: return home PT Goal Formulation: With patient Time For Goal Achievement: 07/13/17 Potential to Achieve Goals: Good    Frequency Min 3X/week   Barriers to discharge        Co-evaluation               AM-PAC PT "6 Clicks" Daily Activity  Outcome Measure Difficulty turning over in bed (including adjusting bedclothes, sheets and blankets)?: None Difficulty moving from lying on back to sitting on the side of the bed? : None Difficulty sitting down on and standing up from a chair with arms (e.g., wheelchair, bedside commode, etc,.)?: None Help needed moving to and from a bed to chair (including a wheelchair)?: A Little Help needed walking in hospital room?: A Little Help needed climbing 3-5 steps with a railing? : A Lot 6 Click Score: 20    End of Session Equipment Utilized During Treatment: Gait belt Activity Tolerance: Patient tolerated treatment well;Patient limited by fatigue Patient left: in chair;with call bell/phone within reach Nurse Communication: Mobility status;Other (comment)(RN notified patient left up in  chair) PT Visit Diagnosis: Unsteadiness on feet (R26.81);Other abnormalities of gait and mobility (R26.89);Muscle weakness (generalized) (M62.81)    Time: 4163-8453 PT Time Calculation (min) (ACUTE ONLY): 24 min   Charges:   PT Evaluation $PT Eval Moderate Complexity: 1 Mod PT Treatments $Therapeutic Activity: 23-37 mins   PT G Codes:        2:25 PM, 31-Jul-2017 Lonell Grandchild, MPT Physical Therapist with [facility] [phone] office [phone] mobile phone

## 2017-07-06 NOTE — Progress Notes (Signed)
PHARMACY - PHYSICIAN COMMUNICATION CRITICAL VALUE ALERT - BLOOD CULTURE IDENTIFICATION (BCID)  Megan [name] is an 73 y.o. female who presented to [facility] on 07/05/2017 with a medical history significant of atrial fibrillation on chronic anticoagulation, hypothyroidism, hypertension, hyperlipidemia, presents to the hospital with complaints of shortness of breath.  Patient had onset of symptoms approximately 1 week ago.  She has had progressive dyspnea, cough productive of thick yellow colored sputum.  She has been feverish and reports a T-max of 100.5.  She reports having some vomiting and diarrhea   Assessment:  CXR did not show any evidence of pna  Name of physician (or Provider) Contacted: [facility]  Current antibiotics: Vancomycin 1250mg  IV q12hrs, Zithromax 250mg  po daily  Results for orders placed or performed during the hospital encounter of 07/05/17  Blood Culture ID Panel (Reflexed) (Collected: 07/05/2017  8:02 AM)  Result Value Ref Range   Enterococcus species NOT DETECTED NOT DETECTED   Listeria monocytogenes NOT DETECTED NOT DETECTED   Staphylococcus species DETECTED (A) NOT DETECTED   Staphylococcus aureus NOT DETECTED NOT DETECTED   Methicillin resistance DETECTED (A) NOT DETECTED   Streptococcus species NOT DETECTED NOT DETECTED   Streptococcus agalactiae NOT DETECTED NOT DETECTED   Streptococcus pneumoniae NOT DETECTED NOT DETECTED   Streptococcus pyogenes NOT DETECTED NOT DETECTED   Acinetobacter baumannii NOT DETECTED NOT DETECTED   Enterobacteriaceae species NOT DETECTED NOT DETECTED   Enterobacter cloacae complex NOT DETECTED NOT DETECTED   Escherichia coli NOT DETECTED NOT DETECTED   Klebsiella oxytoca NOT DETECTED NOT DETECTED   Klebsiella pneumoniae NOT DETECTED NOT DETECTED   Proteus species NOT DETECTED NOT DETECTED   Serratia marcescens NOT DETECTED NOT DETECTED   Haemophilus influenzae NOT DETECTED NOT DETECTED   Neisseria meningitidis NOT DETECTED NOT  DETECTED   Pseudomonas aeruginosa NOT DETECTED NOT DETECTED   Candida albicans NOT DETECTED NOT DETECTED   Candida glabrata NOT DETECTED NOT DETECTED   Candida krusei NOT DETECTED NOT DETECTED   Candida parapsilosis NOT DETECTED NOT DETECTED   Candida tropicalis NOT DETECTED NOT DETECTED    Hart Robinsons A 07/06/2017  10:46 AM

## 2017-07-06 NOTE — Progress Notes (Signed)
[place] for Coumadin (home med) Indication: atrial fibrillation  No Known Allergies  Patient Measurements: Height: 5\' 2"  (157.5 cm) Weight: 287 lb (130.2 kg) IBW/kg (Calculated) : 50.1  Vital Signs: Temp: 98.6 F (37 C) (04/10 0336) Temp Source: Oral (04/10 0336) BP: 149/71 (04/10 0336) Pulse Rate: 102 (04/10 0336)  Labs: Recent Labs    07/05/17 0741 07/06/17 0535  HGB 13.8 14.0  HCT 42.4 42.9  PLT 243 232  APTT 25  --   LABPROT 20.1* 24.0*  INR 1.73 2.17  CREATININE 0.79 0.75  TROPONINI <0.03  --    Estimated Creatinine Clearance: 81.2 mL/min (by C-G formula based on SCr of 0.75 mg/dL).  Medical History: Past Medical History:  Diagnosis Date  . Acute diastolic heart failure ([place])   . Anxiety state, unspecified   . Congestive heart failure, unspecified   . Degenerative joint disease   . GERD (gastroesophageal reflux disease)   . Hyperlipidemia   . Hypertension   . Lymphedema   . Obesity   . Persistent atrial fibrillation ([facility])     post-termination pauses with afib s/p PPM  . Presence of permanent cardiac pacemaker   . Sinoatrial node dysfunction ([facility])    s/p PPM  . Sleep apnea    compliant with CPAP  . Unspecified hypothyroidism   . Unspecified venous (peripheral) insufficiency    Medications:  Medications Prior to Admission  Medication Sig Dispense Refill Last Dose  . acetaminophen (TYLENOL) 500 MG tablet Take 500 mg by mouth every 6 (six) hours as needed for mild pain or moderate pain.   07/04/2017 at Unknown time  . ALPRAZolam (XANAX) 0.25 MG tablet Take 0.25 mg by mouth at bedtime as needed for anxiety or sleep.    Past Week at Unknown time  . atorvastatin (LIPITOR) 80 MG tablet Take 1 tablet (80 mg total) by mouth daily. 90 tablet 3 07/04/2017 at 1830  . diltiazem (CARDIZEM CD) 240 MG 24 hr capsule Take 1 capsule (240 mg total) by mouth daily. 90 capsule 3 07/05/2017 at 0630  . levothyroxine (SYNTHROID, LEVOTHROID)  150 MCG tablet Take 1 tablet (150 mcg total) by mouth every morning. 90 tablet 3 07/04/2017 at Unknown time  . metoprolol tartrate (LOPRESSOR) 100 MG tablet TAKE ONE-HALF TABLET BY MOUTH TWICE DAILY 90 tablet 3 07/04/2017 at 1830  . silver sulfADIAZINE (SILVADENE) 1 % cream Apply 1 application topically daily. 50 g 0 Taking  . warfarin (COUMADIN) 3 MG tablet TAKE 1 TABLET BY MOUTH DAILY EXCEPT TAKE 1 AND 1/2 (ONE-HALF) TABLETS ON SUNDAYS, TUESDAYS AND THURSDAYS 45 tablet 4 07/04/2017 at 1830  . clotrimazole-betamethasone (LOTRISONE) cream Apply 1 application topically daily as needed (for affected area(S)).   Taking  . Vitamin D, Ergocalciferol, (DRISDOL) 50000 units CAPS capsule Take 1 capsule (50,000 Units total) by mouth every 7 (seven) days. Takes on Thursdays. 12 capsule 3 06/30/2017   Assessment: 72yo female on chronic Coumadin PTA.  INR is now at goal after booster dose.    Goal of Therapy:  INR 2-3 Monitor platelets by anticoagulation protocol: Yes   Plan:  Coumadin 2.5mg  today x 1  INR daily  Alonia Dibuono A 07/06/2017,9:08 AM

## 2017-07-07 ENCOUNTER — Encounter (HOSPITAL_COMMUNITY): Payer: Medicare Other | Admitting: Physical Therapy

## 2017-07-07 DIAGNOSIS — G4733 Obstructive sleep apnea (adult) (pediatric): Secondary | ICD-10-CM | POA: Diagnosis not present

## 2017-07-07 DIAGNOSIS — E782 Mixed hyperlipidemia: Secondary | ICD-10-CM | POA: Diagnosis not present

## 2017-07-07 DIAGNOSIS — E039 Hypothyroidism, unspecified: Secondary | ICD-10-CM | POA: Diagnosis not present

## 2017-07-07 DIAGNOSIS — I10 Essential (primary) hypertension: Secondary | ICD-10-CM | POA: Diagnosis not present

## 2017-07-07 DIAGNOSIS — R531 Weakness: Secondary | ICD-10-CM | POA: Diagnosis not present

## 2017-07-07 DIAGNOSIS — I4891 Unspecified atrial fibrillation: Secondary | ICD-10-CM | POA: Diagnosis not present

## 2017-07-07 DIAGNOSIS — J40 Bronchitis, not specified as acute or chronic: Secondary | ICD-10-CM | POA: Diagnosis not present

## 2017-07-07 LAB — PROTIME-INR
INR: 4.32 — AB
Prothrombin Time: 41.1 seconds — ABNORMAL HIGH (ref 11.4–15.2)

## 2017-07-07 MED ORDER — BENZONATATE 200 MG PO CAPS: 200.0000 mg | ORAL_CAPSULE | Freq: Three times a day (TID) | ORAL | 0 refills | Status: DC

## 2017-07-07 MED ORDER — AZITHROMYCIN 250 MG PO TABS: 250.0000 mg | ORAL_TABLET | Freq: Every day | ORAL | 0 refills | Status: DC

## 2017-07-07 MED ORDER — METOPROLOL TARTRATE 75 MG PO TABS: 75.0000 mg | ORAL_TABLET | Freq: Two times a day (BID) | ORAL | 0 refills | Status: DC

## 2017-07-07 MED ORDER — ALBUTEROL SULFATE HFA 108 (90 BASE) MCG/ACT IN AERS: 2.0000 | INHALATION_SPRAY | Freq: Four times a day (QID) | RESPIRATORY_TRACT | 2 refills | Status: DC | PRN

## 2017-07-07 MED ORDER — GUAIFENESIN ER 600 MG PO TB12: 1200.0000 mg | ORAL_TABLET | Freq: Two times a day (BID) | ORAL | 0 refills | Status: DC

## 2017-07-07 MED ORDER — PREDNISONE 10 MG PO TABS: ORAL_TABLET | ORAL | 0 refills | Status: DC

## 2017-07-07 NOTE — Discharge Summary (Signed)
Physician Discharge Summary  Megan [name] ZOX:[id number] DOB: April 03, 1945 DOA: 07/05/2017  PCP: Terald Sleeper, PA-C  Admit date: 07/05/2017 Discharge date: 07/07/2017  Admitted From: Home Disposition: Home  Recommendations for Outpatient Follow-up:  1. Follow up with PCP in 1-2 weeks 2. Please obtain BMP/CBC in one week  Home Health: Patient has refused home health Equipment/Devices:  Discharge Condition: Stable CODE STATUS: Full code Diet recommendation: Heart Healthy  Brief/Interim Summary: 72 year old female admitted to the hospital with shortness of breath.  Found to have acute bronchitis requiring IV steroids, bronchodilators and antibiotics.  No evidence of pneumonia on chest x-ray.  Patient also has chronic atrial fibrillation and developed rapid ventricular response.  Metoprolol dose was adjusted with improvement of her heart rate.  Overall respiratory status has improved.  She will be placed on a prednisone taper and continue a course of antibiotics.  She will be discharged home with an albuterol inhaler.  Discharge Diagnoses:  Active Problems:   HYPERTENSION, BENIGN   ATRIAL FIBRILLATION   Morbid obesity due to excess calories (HCC)   OSA (obstructive sleep apnea)   Acquired hypothyroidism   Mixed hyperlipidemia   Bronchitis  1. Acute bronchitis.  No evidence of pneumonia on chest x-ray.  Improving with steroids, bronchodilators and antibiotics.    She was transitioned to prednisone taper.  She will be discharged on antibiotics as well as albuterol inhaler.  Suspect that her respiratory status is returning to baseline.. 2. Chronic atrial fibrillation.    Patient was having episodes of tachycardia.  Metoprolol has been adjusted and heart rate is better today.  She is anticoagulated with Coumadin. 3. Generalized weakness.  Seen by physical therapy.  Recommended home health physical therapy, although patient is refusing this. 4. Hypothyroidism.  Continue  Synthroid 5. Hyperlipidemia.  Continue statin 6. Obstructive sleep apnea.  Continue CPAP 7. Morbid obesity.  Counseled on the importance of diet and exercise. 8. Positive blood culture.  Found to have 1 out of 4 positive blood cultures for gram-positive cocci.  Suspect this is contaminant.  She is not febrile, septic or toxic.  We will not continue any further antibiotics.     Discharge Instructions  Discharge Instructions    Diet - low sodium heart healthy   Complete by:  As directed    Increase activity slowly   Complete by:  As directed      Allergies as of 07/07/2017   No Known Allergies     Medication List    TAKE these medications   acetaminophen 500 MG tablet Commonly known as:  TYLENOL Take 500 mg by mouth every 6 (six) hours as needed for mild pain or moderate pain.   albuterol 108 (90 Base) MCG/ACT inhaler Commonly known as:  PROVENTIL HFA;VENTOLIN HFA Inhale 2 puffs into the lungs every 6 (six) hours as needed for wheezing or shortness of breath.   ALPRAZolam 0.25 MG tablet Commonly known as:  XANAX Take 0.25 mg by mouth at bedtime as needed for anxiety or sleep.   atorvastatin 80 MG tablet Commonly known as:  LIPITOR Take 1 tablet (80 mg total) by mouth daily.   azithromycin 250 MG tablet Commonly known as:  ZITHROMAX Take 1 tablet (250 mg total) by mouth daily.   benzonatate 200 MG capsule Commonly known as:  TESSALON Take 1 capsule (200 mg total) by mouth 3 (three) times daily.   clotrimazole-betamethasone cream Commonly known as:  LOTRISONE Apply 1 application topically daily as needed (for affected area(S)).  diltiazem 240 MG 24 hr capsule Commonly known as:  CARDIZEM CD Take 1 capsule (240 mg total) by mouth daily.   guaiFENesin 600 MG 12 hr tablet Commonly known as:  MUCINEX Take 2 tablets (1,200 mg total) by mouth 2 (two) times daily.   levothyroxine 150 MCG tablet Commonly known as:  SYNTHROID, LEVOTHROID Take 1 tablet (150 mcg total)  by mouth every morning.   Metoprolol Tartrate 75 MG Tabs Take 75 mg by mouth 2 (two) times daily. What changed:    medication strength  how much to take   predniSONE 10 MG tablet Commonly known as:  DELTASONE Take 40mg  po daily for 2 days then 30mg  daily for 2 days then 20mg  daily for 2 days then 10mg  daily for 2 days then stop   silver sulfADIAZINE 1 % cream Commonly known as:  SILVADENE Apply 1 application topically daily.   Vitamin D (Ergocalciferol) 50000 units Caps capsule Commonly known as:  DRISDOL Take 1 capsule (50,000 Units total) by mouth every 7 (seven) days. Takes on Thursdays.   warfarin 3 MG tablet Commonly known as:  COUMADIN Take as directed. If you are unsure how to take this medication, talk to your nurse or doctor. Original instructions:  TAKE 1 TABLET BY MOUTH DAILY EXCEPT TAKE 1 AND 1/2 (ONE-HALF) TABLETS ON SUNDAYS, TUESDAYS AND THURSDAYS       No Known Allergies  Consultations:     Procedures/Studies: Dg Chest 2 View  Result Date: 07/05/2017 CLINICAL DATA:  Cough, weakness EXAM: CHEST - 2 VIEW COMPARISON:  08/17/2015 FINDINGS: Left pacer remains in place, unchanged. Low lung volumes with bibasilar atelectasis. Mild vascular congestion. Heart is borderline in size. No effusions. No acute bony abnormality. IMPRESSION: Low lung volumes, bibasilar atelectasis.  Mild vascular congestion. Electronically Signed   By: Rolm Baptise M.D.   On: 07/05/2017 08:29       Subjective: Feeling better today.  Shortness of breath improving.  No palpitations.  Discharge Exam: Vitals:   07/07/17 0625 07/07/17 0821  BP: 137/85   Pulse: 74   Resp: 16   Temp: 97.6 F (36.4 C)   SpO2: 95% 90%   Vitals:   07/06/17 2137 07/07/17 0200 07/07/17 0625 07/07/17 0821  BP: (!) 166/85  137/85   Pulse: 91  74   Resp: 20  16   Temp: 98.2 F (36.8 C)  97.6 F (36.4 C)   TempSrc: Oral  Oral   SpO2: 97% 94% 95% 90%  Weight:      Height:        General: Pt is  alert, awake, not in acute distress Cardiovascular: irregular, S1/S2 +, no rubs, no gallops Respiratory: CTA bilaterally, no wheezing, no rhonchi Abdominal: Soft, NT, ND, bowel sounds + Extremities: no edema, no cyanosis    The results of significant diagnostics from this hospitalization (including imaging, microbiology, ancillary and laboratory) are listed below for reference.     Microbiology: Recent Results (from the past 240 hour(s))  Blood Culture (routine x 2)     Status: None (Preliminary result)   Collection Time: 07/05/17  7:41 AM  Result Value Ref Range Status   Specimen Description BLOOD LEFT ARM  Final   Special Requests   Final    BOTTLES DRAWN AEROBIC AND ANAEROBIC Blood Culture adequate volume   Culture   Final    NO GROWTH 2 DAYS Performed at [facility], [address], [place], [place] [address]    Report Status PENDING  Incomplete  Blood Culture (routine x 2)     Status: Abnormal (Preliminary result)   Collection Time: 07/05/17  8:02 AM  Result Value Ref Range Status   Specimen Description   Final    BLOOD LEFT ARM Performed at [facility], [address] [place], [place] [address]    Special Requests   Final    BOTTLES DRAWN AEROBIC AND ANAEROBIC Blood Culture adequate volume Performed at [facility], [address], [place], [place] [address]    Culture  Setup Time   Final    GRAM POSITIVE COCCI AEROBIC BOTTLE ONLY Gram Stain Report Called to,Read Back By and Verified With: H.TETREAULT, RN @ 0347 ON 4.10.19 BY L.BOWMAN    Culture (A)  Final    STAPHYLOCOCCUS SPECIES (COAGULASE NEGATIVE) THE SIGNIFICANCE OF ISOLATING THIS ORGANISM FROM A SINGLE SET OF BLOOD CULTURES WHEN MULTIPLE SETS ARE DRAWN IS UNCERTAIN. PLEASE NOTIFY THE MICROBIOLOGY DEPARTMENT WITHIN ONE WEEK IF SPECIATION AND SENSITIVITIES ARE REQUIRED. Performed at [facility], [place] [address], [place], [place] [address]    Report Status PENDING  Incomplete  Blood Culture ID Panel  (Reflexed)     Status: Abnormal   Collection Time: 07/05/17  8:02 AM  Result Value Ref Range Status   Enterococcus species NOT DETECTED NOT DETECTED Final   Listeria monocytogenes NOT DETECTED NOT DETECTED Final   Staphylococcus species DETECTED (A) NOT DETECTED Final    Comment: Methicillin (oxacillin) resistant coagulase negative staphylococcus. Possible blood culture contaminant (unless isolated from more than one blood culture draw or clinical case suggests pathogenicity). No antibiotic treatment is indicated for blood  culture contaminants. CRITICAL RESULT CALLED TO, READ BACK BY AND VERIFIED WITH: MARY SWAYNE PHARMD AT 0931 ON [id number] BY SJW    Staphylococcus aureus NOT DETECTED NOT DETECTED Final   Methicillin resistance DETECTED (A) NOT DETECTED Final    Comment: CRITICAL RESULT CALLED TO, READ BACK BY AND VERIFIED WITH: MARY SWAYNE PHARMD AT 0931 ON [id number] BY SJW    Streptococcus species NOT DETECTED NOT DETECTED Final   Streptococcus agalactiae NOT DETECTED NOT DETECTED Final   Streptococcus pneumoniae NOT DETECTED NOT DETECTED Final   Streptococcus pyogenes NOT DETECTED NOT DETECTED Final   Acinetobacter baumannii NOT DETECTED NOT DETECTED Final   Enterobacteriaceae species NOT DETECTED NOT DETECTED Final   Enterobacter cloacae complex NOT DETECTED NOT DETECTED Final   Escherichia coli NOT DETECTED NOT DETECTED Final   Klebsiella oxytoca NOT DETECTED NOT DETECTED Final   Klebsiella pneumoniae NOT DETECTED NOT DETECTED Final   Proteus species NOT DETECTED NOT DETECTED Final   Serratia marcescens NOT DETECTED NOT DETECTED Final   Haemophilus influenzae NOT DETECTED NOT DETECTED Final   Neisseria meningitidis NOT DETECTED NOT DETECTED Final   Pseudomonas aeruginosa NOT DETECTED NOT DETECTED Final   Candida albicans NOT DETECTED NOT DETECTED Final   Candida glabrata NOT DETECTED NOT DETECTED Final   Candida krusei NOT DETECTED NOT DETECTED Final   Candida parapsilosis NOT  DETECTED NOT DETECTED Final   Candida tropicalis NOT DETECTED NOT DETECTED Final    Comment: Performed at [facility], [place]. [address] [place], [place] [address]     Labs: BNP (last 3 results) Recent Labs    07/05/17 0853  BNP 660.6*   Basic Metabolic Panel: Recent Labs  Lab 07/05/17 0741 07/06/17 0535  NA 136 138  K 4.2 4.2  CL 100* 103  CO2 24 22  GLUCOSE 138* 174*  BUN 16 14  CREATININE 0.79  0.75  CALCIUM 8.7* 8.6*   Liver Function Tests: Recent Labs  Lab 07/05/17 0741  AST 29  ALT 29  ALKPHOS 117  BILITOT 1.0  PROT 7.0  ALBUMIN 3.2*   No results for input(s): LIPASE, AMYLASE in the last 168 hours. No results for input(s): AMMONIA in the last 168 hours. CBC: Recent Labs  Lab 07/05/17 0741 07/06/17 0535  WBC 8.8 6.7  NEUTROABS 7.2  --   HGB 13.8 14.0  HCT 42.4 42.9  MCV 94.2 94.1  PLT 243 232   Cardiac Enzymes: Recent Labs  Lab 07/05/17 0741  TROPONINI <0.03   BNP: Invalid input(s): POCBNP CBG: No results for input(s): GLUCAP in the last 168 hours. D-Dimer No results for input(s): DDIMER in the last 72 hours. Hgb A1c No results for input(s): HGBA1C in the last 72 hours. Lipid Profile No results for input(s): CHOL, HDL, LDLCALC, TRIG, CHOLHDL, LDLDIRECT in the last 72 hours. Thyroid function studies No results for input(s): TSH, T4TOTAL, T3FREE, THYROIDAB in the last 72 hours.  Invalid input(s): FREET3 Anemia work up No results for input(s): VITAMINB12, FOLATE, FERRITIN, TIBC, IRON, RETICCTPCT in the last 72 hours. Urinalysis    Component Value Date/Time   COLORURINE YELLOW 07/05/2017 0919   APPEARANCEUR HAZY (A) 07/05/2017 0919   LABSPEC 1.025 07/05/2017 0919   PHURINE 5.0 07/05/2017 0919   GLUCOSEU NEGATIVE 07/05/2017 0919   HGBUR NEGATIVE 07/05/2017 0919   BILIRUBINUR NEGATIVE 07/05/2017 0919   KETONESUR NEGATIVE 07/05/2017 0919   PROTEINUR NEGATIVE 07/05/2017 0919   UROBILINOGEN 0.2 02/01/2014 1811   NITRITE NEGATIVE  07/05/2017 0919   LEUKOCYTESUR LARGE (A) 07/05/2017 0919   Sepsis Labs Invalid input(s): PROCALCITONIN,  WBC,  LACTICIDVEN Microbiology Recent Results (from the past 240 hour(s))  Blood Culture (routine x 2)     Status: None (Preliminary result)   Collection Time: 07/05/17  7:41 AM  Result Value Ref Range Status   Specimen Description BLOOD LEFT ARM  Final   Special Requests   Final    BOTTLES DRAWN AEROBIC AND ANAEROBIC Blood Culture adequate volume   Culture   Final    NO GROWTH 2 DAYS Performed at [facility], [address] [place], [place] [address]    Report Status PENDING  Incomplete  Blood Culture (routine x 2)     Status: Abnormal (Preliminary result)   Collection Time: 07/05/17  8:02 AM  Result Value Ref Range Status   Specimen Description   Final    BLOOD LEFT ARM Performed at [facility], [address], [place], [place] [address]    Special Requests   Final    BOTTLES DRAWN AEROBIC AND ANAEROBIC Blood Culture adequate volume Performed at [facility], [address] [place], [place] [address]    Culture  Setup Time   Final    GRAM POSITIVE COCCI AEROBIC BOTTLE ONLY Gram Stain Report Called to,Read Back By and Verified With: H.TETREAULT, RN @ 2130 ON 4.10.19 BY L.BOWMAN    Culture (A)  Final    STAPHYLOCOCCUS SPECIES (COAGULASE NEGATIVE) THE SIGNIFICANCE OF ISOLATING THIS ORGANISM FROM A SINGLE SET OF BLOOD CULTURES WHEN MULTIPLE SETS ARE DRAWN IS UNCERTAIN. PLEASE NOTIFY THE MICROBIOLOGY DEPARTMENT WITHIN ONE WEEK IF SPECIATION AND SENSITIVITIES ARE REQUIRED. Performed at [facility], [place] [address], [place], [place] [address]    Report Status PENDING  Incomplete  Blood Culture ID Panel (Reflexed)     Status: Abnormal   Collection Time: 07/05/17  8:02 AM  Result  Value Ref Range Status   Enterococcus species NOT DETECTED NOT DETECTED Final   Listeria monocytogenes NOT DETECTED NOT DETECTED Final   Staphylococcus species DETECTED (A) NOT DETECTED Final     Comment: Methicillin (oxacillin) resistant coagulase negative staphylococcus. Possible blood culture contaminant (unless isolated from more than one blood culture draw or clinical case suggests pathogenicity). No antibiotic treatment is indicated for blood  culture contaminants. CRITICAL RESULT CALLED TO, READ BACK BY AND VERIFIED WITH: MARY SWAYNE PHARMD AT 0931 ON [id number] BY SJW    Staphylococcus aureus NOT DETECTED NOT DETECTED Final   Methicillin resistance DETECTED (A) NOT DETECTED Final    Comment: CRITICAL RESULT CALLED TO, READ BACK BY AND VERIFIED WITH: MARY SWAYNE PHARMD AT 0931 ON [id number] BY SJW    Streptococcus species NOT DETECTED NOT DETECTED Final   Streptococcus agalactiae NOT DETECTED NOT DETECTED Final   Streptococcus pneumoniae NOT DETECTED NOT DETECTED Final   Streptococcus pyogenes NOT DETECTED NOT DETECTED Final   Acinetobacter baumannii NOT DETECTED NOT DETECTED Final   Enterobacteriaceae species NOT DETECTED NOT DETECTED Final   Enterobacter cloacae complex NOT DETECTED NOT DETECTED Final   Escherichia coli NOT DETECTED NOT DETECTED Final   Klebsiella oxytoca NOT DETECTED NOT DETECTED Final   Klebsiella pneumoniae NOT DETECTED NOT DETECTED Final   Proteus species NOT DETECTED NOT DETECTED Final   Serratia marcescens NOT DETECTED NOT DETECTED Final   Haemophilus influenzae NOT DETECTED NOT DETECTED Final   Neisseria meningitidis NOT DETECTED NOT DETECTED Final   Pseudomonas aeruginosa NOT DETECTED NOT DETECTED Final   Candida albicans NOT DETECTED NOT DETECTED Final   Candida glabrata NOT DETECTED NOT DETECTED Final   Candida krusei NOT DETECTED NOT DETECTED Final   Candida parapsilosis NOT DETECTED NOT DETECTED Final   Candida tropicalis NOT DETECTED NOT DETECTED Final    Comment: Performed at [facility], [place]. [address] [place], [place] [address]     Time coordinating discharge: 50mins  SIGNED:   Kathie Dike, MD  [facility] 07/07/2017, 12:33 PM Pager   If 7PM-7AM, please contact night-coverage [email or web address] Password [email or web address]

## 2017-07-07 NOTE — Progress Notes (Signed)
CRITICAL VALUE ALERT  Critical Value:  Troponin 4.32  Date & Time Notied:  07/07/2017 0827  Provider Notified: Dr. Roderic Palau  Orders Received/Actions taken: [facility]

## 2017-07-07 NOTE — Discharge Instructions (Signed)
1. Follow up with PCP in 1-2 weeks °2. Please obtain BMP/CBC in one week ° ° ° °

## 2017-07-07 NOTE — Progress Notes (Signed)
[place] for Coumadin (home med) Indication: atrial fibrillation  No Known Allergies  Patient Measurements: Height: 5\' 2"  (157.5 cm) Weight: 287 lb (130.2 kg) IBW/kg (Calculated) : 50.1  Vital Signs: Temp: 97.6 F (36.4 C) (04/11 0625) Temp Source: Oral (04/11 0625) BP: 137/85 (04/11 0625) Pulse Rate: 74 (04/11 0625)  Labs: Recent Labs    07/05/17 0741 07/06/17 0535 07/07/17 0749  HGB 13.8 14.0  --   HCT 42.4 42.9  --   PLT 243 232  --   APTT 25  --   --   LABPROT 20.1* 24.0* 41.1*  INR 1.73 2.17 4.32*  CREATININE 0.79 0.75  --   TROPONINI <0.03  --   --    Estimated Creatinine Clearance: 81.2 mL/min (by C-G formula based on SCr of 0.75 mg/dL).  Assessment: 72yo female on chronic Coumadin PTA.  INR is now above goal.    Goal of Therapy:  INR 2-3 Monitor platelets by anticoagulation protocol: Yes   Plan:  No warfarin today INR daily Monitor for signs and symptoms of bleeding.   Biagio Quint R 07/07/2017,10:17 AM

## 2017-07-07 NOTE — Progress Notes (Signed)
Discharge instructions gone over with patient and family, verbalized understanding. IV removed, patient tolerated procedure well. 

## 2017-07-08 LAB — CULTURE, BLOOD (ROUTINE X 2): SPECIAL REQUESTS: ADEQUATE

## 2017-07-10 LAB — CULTURE, BLOOD (ROUTINE X 2)
Culture: NO GROWTH
Special Requests: ADEQUATE

## 2017-07-11 DIAGNOSIS — S8012XD Contusion of left lower leg, subsequent encounter: Secondary | ICD-10-CM | POA: Diagnosis not present

## 2017-07-12 ENCOUNTER — Encounter (HOSPITAL_COMMUNITY): Payer: Medicare Other | Admitting: Physical Therapy

## 2017-07-12 ENCOUNTER — Ambulatory Visit (INDEPENDENT_AMBULATORY_CARE_PROVIDER_SITE_OTHER): Payer: Medicare Other | Admitting: *Deleted

## 2017-07-12 DIAGNOSIS — I4891 Unspecified atrial fibrillation: Secondary | ICD-10-CM

## 2017-07-12 DIAGNOSIS — Z5181 Encounter for therapeutic drug level monitoring: Secondary | ICD-10-CM

## 2017-07-12 LAB — POCT INR: INR: 6.2

## 2017-07-12 NOTE — Patient Instructions (Signed)
On prednisone taper.  (30mg  x 2 days, 20mg  x 2 days, 10mg  x 2 day)  Will finish 4/21 Recheck 07/19/17 Continue greens

## 2017-07-14 ENCOUNTER — Encounter (HOSPITAL_COMMUNITY): Payer: Medicare Other | Admitting: Physical Therapy

## 2017-07-19 ENCOUNTER — Encounter (HOSPITAL_COMMUNITY): Payer: Medicare Other | Admitting: Physical Therapy

## 2017-07-21 ENCOUNTER — Ambulatory Visit (INDEPENDENT_AMBULATORY_CARE_PROVIDER_SITE_OTHER): Payer: Medicare Other | Admitting: *Deleted

## 2017-07-21 ENCOUNTER — Encounter (HOSPITAL_COMMUNITY): Payer: Medicare Other | Admitting: Physical Therapy

## 2017-07-21 DIAGNOSIS — I4891 Unspecified atrial fibrillation: Secondary | ICD-10-CM

## 2017-07-21 DIAGNOSIS — Z5181 Encounter for therapeutic drug level monitoring: Secondary | ICD-10-CM

## 2017-07-21 LAB — POCT INR: INR: 2.6

## 2017-07-21 NOTE — Patient Instructions (Signed)
Continue coumadin 1 tablet daily except 1 1/2 tablets on Mondays and Thursdays Recheck in 3 weeks Continue greens

## 2017-07-22 ENCOUNTER — Ambulatory Visit: Payer: Medicare Other | Admitting: Physician Assistant

## 2017-07-26 ENCOUNTER — Encounter (HOSPITAL_COMMUNITY): Payer: Medicare Other | Admitting: Physical Therapy

## 2017-07-26 ENCOUNTER — Encounter: Payer: Self-pay | Admitting: Physician Assistant

## 2017-07-26 ENCOUNTER — Ambulatory Visit: Payer: Medicare Other | Admitting: Physician Assistant

## 2017-07-26 ENCOUNTER — Ambulatory Visit (INDEPENDENT_AMBULATORY_CARE_PROVIDER_SITE_OTHER): Payer: Medicare Other | Admitting: Physician Assistant

## 2017-07-26 VITALS — BP 125/80 | HR 60 | Temp 96.6°F | Ht 62.0 in | Wt 273.8 lb

## 2017-07-26 DIAGNOSIS — R29898 Other symptoms and signs involving the musculoskeletal system: Secondary | ICD-10-CM

## 2017-07-26 DIAGNOSIS — Z9181 History of falling: Secondary | ICD-10-CM

## 2017-07-26 DIAGNOSIS — I10 Essential (primary) hypertension: Secondary | ICD-10-CM

## 2017-07-26 DIAGNOSIS — R739 Hyperglycemia, unspecified: Secondary | ICD-10-CM

## 2017-07-26 DIAGNOSIS — I890 Lymphedema, not elsewhere classified: Secondary | ICD-10-CM | POA: Diagnosis not present

## 2017-07-26 DIAGNOSIS — I739 Peripheral vascular disease, unspecified: Secondary | ICD-10-CM | POA: Diagnosis not present

## 2017-07-26 DIAGNOSIS — E782 Mixed hyperlipidemia: Secondary | ICD-10-CM | POA: Diagnosis not present

## 2017-07-26 MED ORDER — SULFAMETHOXAZOLE-TRIMETHOPRIM 800-160 MG PO TABS: 1.0000 | ORAL_TABLET | Freq: Two times a day (BID) | ORAL | 0 refills | Status: DC

## 2017-07-26 NOTE — Progress Notes (Signed)
BP 125/80   Pulse 60   Temp (!) 96.6 F (35.9 C) (Oral)   Ht '5\' 2"'  (1.575 m)   Wt 273 lb 12.8 oz (124.2 kg)   BMI 50.08 kg/m    Subjective:    Patient ID: Megan [name], female    DOB: September 12, 1945, 72 y.o.   MRN: [id number]  HPI: Megan [name] is a 72 y.o. female presenting on 07/26/2017 for Hospitalization Follow-up  Patient came in for post hospital follow-up.  She had bronchitis that was quite severe.  She ended up staying for several days.  She reports that she is doing some better.  Her edema is greatly improved.  She also had some cellulitis in her lower legs where she has chronic lymphedema.  She is still quite weak and is not walking as well as she should.  We have discussed that physical therapy would be good for her to get built back up.  Past Medical History:  Diagnosis Date  . Acute diastolic heart failure ([place])   . Anxiety state, unspecified   . Congestive heart failure, unspecified   . Degenerative joint disease   . GERD (gastroesophageal reflux disease)   . Hyperlipidemia   . Hypertension   . Lymphedema   . Obesity   . Persistent atrial fibrillation (HCC)     post-termination pauses with afib s/p PPM  . Presence of permanent cardiac pacemaker   . Sinoatrial node dysfunction (HCC)    s/p PPM  . Sleep apnea    compliant with CPAP  . Unspecified hypothyroidism   . Unspecified venous (peripheral) insufficiency    Relevant past medical, surgical, family and social history reviewed and updated as indicated. Interim medical history since our last visit reviewed. Allergies and medications reviewed and updated. DATA REVIEWED: CHART IN EPIC  Family History reviewed for pertinent findings.  Review of Systems  Constitutional: Negative.  Negative for activity change, fatigue and fever.  HENT: Negative.   Eyes: Negative.   Respiratory: Negative.  Negative for cough.   Cardiovascular: Positive for leg swelling. Negative for chest pain.  Gastrointestinal:  Negative.  Negative for abdominal pain.  Endocrine: Negative.   Genitourinary: Negative.  Negative for dysuria.  Musculoskeletal: Positive for arthralgias and back pain.  Skin: Negative.   Neurological: Negative.     Allergies as of 07/26/2017   No Known Allergies     Medication List        Accurate as of 07/26/17 10:55 PM. Always use your most recent med list.          acetaminophen 500 MG tablet Commonly known as:  TYLENOL Take 500 mg by mouth every 6 (six) hours as needed for mild pain or moderate pain.   albuterol 108 (90 Base) MCG/ACT inhaler Commonly known as:  PROVENTIL HFA;VENTOLIN HFA Inhale 2 puffs into the lungs every 6 (six) hours as needed for wheezing or shortness of breath.   ALPRAZolam 0.25 MG tablet Commonly known as:  XANAX Take 0.25 mg by mouth at bedtime as needed for anxiety or sleep.   atorvastatin 80 MG tablet Commonly known as:  LIPITOR Take 1 tablet (80 mg total) by mouth daily.   clotrimazole-betamethasone cream Commonly known as:  LOTRISONE Apply 1 application topically daily as needed (for affected area(S)).   diltiazem 240 MG 24 hr capsule Commonly known as:  CARDIZEM CD Take 1 capsule (240 mg total) by mouth daily.   guaiFENesin 600 MG 12 hr tablet Commonly known as:  MUCINEX  Take 2 tablets (1,200 mg total) by mouth 2 (two) times daily.   levothyroxine 150 MCG tablet Commonly known as:  SYNTHROID, LEVOTHROID Take 1 tablet (150 mcg total) by mouth every morning.   Metoprolol Tartrate 75 MG Tabs Take 75 mg by mouth 2 (two) times daily.   silver sulfADIAZINE 1 % cream Commonly known as:  SILVADENE Apply 1 application topically daily.   sulfamethoxazole-trimethoprim 800-160 MG tablet Commonly known as:  BACTRIM DS,SEPTRA DS Take 1 tablet by mouth 2 (two) times daily.   Vitamin D (Ergocalciferol) 50000 units Caps capsule Commonly known as:  DRISDOL Take 1 capsule (50,000 Units total) by mouth every 7 (seven) days. Takes on  Thursdays.   warfarin 3 MG tablet Commonly known as:  COUMADIN Take as directed by the anticoagulation clinic. If you are unsure how to take this medication, talk to your nurse or doctor. Original instructions:  TAKE 1 TABLET BY MOUTH DAILY EXCEPT TAKE 1 AND 1/2 (ONE-HALF) TABLETS ON SUNDAYS, TUESDAYS AND THURSDAYS          Objective:    BP 125/80   Pulse 60   Temp (!) 96.6 F (35.9 C) (Oral)   Ht '5\' 2"'  (1.575 m)   Wt 273 lb 12.8 oz (124.2 kg)   BMI 50.08 kg/m   No Known Allergies  Wt Readings from Last 3 Encounters:  07/26/17 273 lb 12.8 oz (124.2 kg)  07/05/17 287 lb (130.2 kg)  05/11/17 287 lb 6.4 oz (130.4 kg)    Physical Exam  Constitutional: She is oriented to person, place, and time. She appears well-developed and well-nourished.  HENT:  Head: Normocephalic and atraumatic.  Eyes: Pupils are equal, round, and reactive to light. Conjunctivae and EOM are normal.  Cardiovascular: Normal rate, regular rhythm, normal heart sounds and intact distal pulses.  Pulmonary/Chest: Effort normal and breath sounds normal.  Abdominal: Soft. Bowel sounds are normal.  Neurological: She is alert and oriented to person, place, and time. She has normal reflexes.  Skin: Skin is warm and dry. No rash noted.  Psychiatric: She has a normal mood and affect. Her behavior is normal. Judgment and thought content normal.    Results for orders placed or performed in visit on 07/21/17  POCT INR  Result Value Ref Range   INR 2.6       Assessment & Plan:   1. Weakness of both lower extremities - Ambulatory referral to Physical Therapy  2. PVD (peripheral vascular disease) with claudication ([place]) - Ambulatory referral to Physical Therapy  3. Lymphedema  4. At maximum risk for fall - Ambulatory referral to Physical Therapy  5. HYPERTENSION, BENIGN - CBC with Differential/Platelet; Future - CMP14+EGFR; Future  6. Mixed hyperlipidemia - CMP14+EGFR; Future - Lipid panel; Future  7.  Hyperglycemia - CMP14+EGFR; Future - Bayer DCA Hb A1c Waived; Future   Continue all other maintenance medications as listed above.  Follow up plan: No follow-ups on file.  Educational handout given for [place] PA-C [place] [address]  [place], [place] [address] [phone]   07/26/2017, 10:55 PM

## 2017-07-27 ENCOUNTER — Ambulatory Visit: Payer: Medicare Other | Admitting: Physician Assistant

## 2017-08-03 ENCOUNTER — Ambulatory Visit: Payer: Medicare Other | Admitting: Physical Therapy

## 2017-08-09 ENCOUNTER — Ambulatory Visit (INDEPENDENT_AMBULATORY_CARE_PROVIDER_SITE_OTHER): Payer: Medicare Other | Admitting: *Deleted

## 2017-08-09 DIAGNOSIS — I4891 Unspecified atrial fibrillation: Secondary | ICD-10-CM

## 2017-08-09 DIAGNOSIS — Z5181 Encounter for therapeutic drug level monitoring: Secondary | ICD-10-CM

## 2017-08-09 LAB — POCT INR: INR: 2.4

## 2017-08-09 NOTE — Patient Instructions (Signed)
Continue coumadin 1 tablet daily except 1 1/2 tablets on Mondays and Thursdays Recheck in 3 weeks Continue greens

## 2017-09-01 ENCOUNTER — Ambulatory Visit (INDEPENDENT_AMBULATORY_CARE_PROVIDER_SITE_OTHER): Payer: Medicare Other | Admitting: *Deleted

## 2017-09-01 DIAGNOSIS — I4891 Unspecified atrial fibrillation: Secondary | ICD-10-CM

## 2017-09-01 DIAGNOSIS — Z5181 Encounter for therapeutic drug level monitoring: Secondary | ICD-10-CM

## 2017-09-01 LAB — POCT INR: INR: 3.6 — AB (ref 2.0–3.0)

## 2017-09-01 NOTE — Patient Instructions (Signed)
Hold coumadin tonight then resume 1 tablet daily except 1 1/2 tablets on Mondays and Thursdays Recheck in 3 weeks Continue greens (Has not had greens this time and took Bactrim DS several days for UTI)

## 2017-09-20 ENCOUNTER — Ambulatory Visit (INDEPENDENT_AMBULATORY_CARE_PROVIDER_SITE_OTHER): Payer: Medicare Other | Admitting: Pharmacist

## 2017-09-20 DIAGNOSIS — I4891 Unspecified atrial fibrillation: Secondary | ICD-10-CM | POA: Diagnosis not present

## 2017-09-20 LAB — POCT INR: INR: 3.3 — AB (ref 2.0–3.0)

## 2017-09-20 NOTE — Patient Instructions (Signed)
Description   Hold coumadin tonight then change dose to 1 tablet daily except 1 1/2 tablets on Mondays Recheck in 2 weeks Continue greens

## 2017-10-04 ENCOUNTER — Ambulatory Visit (INDEPENDENT_AMBULATORY_CARE_PROVIDER_SITE_OTHER): Payer: Medicare Other | Admitting: *Deleted

## 2017-10-04 DIAGNOSIS — Z961 Presence of intraocular lens: Secondary | ICD-10-CM | POA: Diagnosis not present

## 2017-10-04 DIAGNOSIS — H353131 Nonexudative age-related macular degeneration, bilateral, early dry stage: Secondary | ICD-10-CM | POA: Diagnosis not present

## 2017-10-04 DIAGNOSIS — H43393 Other vitreous opacities, bilateral: Secondary | ICD-10-CM | POA: Diagnosis not present

## 2017-10-04 DIAGNOSIS — I4891 Unspecified atrial fibrillation: Secondary | ICD-10-CM

## 2017-10-04 DIAGNOSIS — Z5181 Encounter for therapeutic drug level monitoring: Secondary | ICD-10-CM

## 2017-10-04 DIAGNOSIS — H43813 Vitreous degeneration, bilateral: Secondary | ICD-10-CM | POA: Diagnosis not present

## 2017-10-04 LAB — POCT INR: INR: 2.6 (ref 2.0–3.0)

## 2017-10-04 NOTE — Patient Instructions (Signed)
Continue 1 tablet daily except 1 1/2 tablets on Mondays Recheck in 3 weeks Continue greens

## 2017-10-18 ENCOUNTER — Other Ambulatory Visit: Payer: Medicare Other

## 2017-10-18 DIAGNOSIS — R739 Hyperglycemia, unspecified: Secondary | ICD-10-CM

## 2017-10-18 DIAGNOSIS — E782 Mixed hyperlipidemia: Secondary | ICD-10-CM | POA: Diagnosis not present

## 2017-10-18 DIAGNOSIS — Z0289 Encounter for other administrative examinations: Secondary | ICD-10-CM

## 2017-10-18 DIAGNOSIS — I10 Essential (primary) hypertension: Secondary | ICD-10-CM

## 2017-10-18 LAB — BAYER DCA HB A1C WAIVED: HB A1C (BAYER DCA - WAIVED): 5.6 % (ref <7.0)

## 2017-10-19 LAB — LIPID PANEL
CHOL/HDL RATIO: 4.5 ratio — AB (ref 0.0–4.4)
Cholesterol, Total: 240 mg/dL — ABNORMAL HIGH (ref 100–199)
HDL: 53 mg/dL (ref >39)
LDL CALC: 142 mg/dL — AB (ref 0–99)
TRIGLYCERIDES: 224 mg/dL — AB (ref 0–149)
VLDL Cholesterol Cal: 45 mg/dL — ABNORMAL HIGH (ref 5–40)

## 2017-10-19 LAB — CBC WITH DIFFERENTIAL/PLATELET
BASOS ABS: 0 10*3/uL (ref 0.0–0.2)
Basos: 0 %
EOS (ABSOLUTE): 0.1 10*3/uL (ref 0.0–0.4)
Eos: 1 %
HEMOGLOBIN: 15.1 g/dL (ref 11.1–15.9)
Hematocrit: 45.2 % (ref 34.0–46.6)
IMMATURE GRANS (ABS): 0 10*3/uL (ref 0.0–0.1)
Immature Granulocytes: 0 %
LYMPHS: 21 %
Lymphocytes Absolute: 2 10*3/uL (ref 0.7–3.1)
MCH: 32.8 pg (ref 26.6–33.0)
MCHC: 33.4 g/dL (ref 31.5–35.7)
MCV: 98 fL — ABNORMAL HIGH (ref 79–97)
Monocytes Absolute: 0.7 10*3/uL (ref 0.1–0.9)
Monocytes: 7 %
Neutrophils Absolute: 6.5 10*3/uL (ref 1.4–7.0)
Neutrophils: 71 %
Platelets: 327 10*3/uL (ref 150–450)
RBC: 4.61 x10E6/uL (ref 3.77–5.28)
RDW: 14.4 % (ref 12.3–15.4)
WBC: 9.2 10*3/uL (ref 3.4–10.8)

## 2017-10-19 LAB — CMP14+EGFR
A/G RATIO: 1.5 (ref 1.2–2.2)
ALK PHOS: 146 IU/L — AB (ref 39–117)
ALT: 23 IU/L (ref 0–32)
AST: 16 IU/L (ref 0–40)
Albumin: 4.1 g/dL (ref 3.5–4.8)
BILIRUBIN TOTAL: 1.1 mg/dL (ref 0.0–1.2)
BUN/Creatinine Ratio: 13 (ref 12–28)
BUN: 12 mg/dL (ref 8–27)
CO2: 20 mmol/L (ref 20–29)
Calcium: 9.6 mg/dL (ref 8.7–10.3)
Chloride: 103 mmol/L (ref 96–106)
Creatinine, Ser: 0.94 mg/dL (ref 0.57–1.00)
GFR calc Af Amer: 70 mL/min/{1.73_m2} (ref >59)
GFR calc non Af Amer: 60 mL/min/{1.73_m2} (ref >59)
GLOBULIN, TOTAL: 2.8 g/dL (ref 1.5–4.5)
Glucose: 119 mg/dL — ABNORMAL HIGH (ref 65–99)
Potassium: 4.5 mmol/L (ref 3.5–5.2)
Sodium: 144 mmol/L (ref 134–144)
Total Protein: 6.9 g/dL (ref 6.0–8.5)

## 2017-10-20 ENCOUNTER — Telehealth: Payer: Self-pay | Admitting: Physician Assistant

## 2017-10-20 ENCOUNTER — Other Ambulatory Visit: Payer: Self-pay

## 2017-10-20 MED ORDER — ATORVASTATIN CALCIUM 80 MG PO TABS: 80.0000 mg | ORAL_TABLET | Freq: Every day | ORAL | 0 refills | Status: DC

## 2017-10-27 ENCOUNTER — Encounter: Payer: Self-pay | Admitting: *Deleted

## 2017-10-27 ENCOUNTER — Ambulatory Visit (INDEPENDENT_AMBULATORY_CARE_PROVIDER_SITE_OTHER): Payer: Medicare Other | Admitting: *Deleted

## 2017-10-27 DIAGNOSIS — Z5181 Encounter for therapeutic drug level monitoring: Secondary | ICD-10-CM

## 2017-10-27 DIAGNOSIS — I4891 Unspecified atrial fibrillation: Secondary | ICD-10-CM

## 2017-10-27 LAB — POCT INR: INR: 2.2 (ref 2.0–3.0)

## 2017-10-27 NOTE — Patient Instructions (Signed)
Continue 1 tablet daily except 1 1/2 tablets on Mondays Recheck in 4 weeks Continue greens

## 2017-10-28 ENCOUNTER — Ambulatory Visit (INDEPENDENT_AMBULATORY_CARE_PROVIDER_SITE_OTHER): Payer: Medicare Other | Admitting: Internal Medicine

## 2017-10-28 ENCOUNTER — Encounter: Payer: Self-pay | Admitting: *Deleted

## 2017-10-28 VITALS — BP 128/80 | HR 66 | Ht 61.0 in | Wt 283.2 lb

## 2017-10-28 DIAGNOSIS — I482 Chronic atrial fibrillation: Secondary | ICD-10-CM | POA: Diagnosis not present

## 2017-10-28 DIAGNOSIS — Z950 Presence of cardiac pacemaker: Secondary | ICD-10-CM

## 2017-10-28 DIAGNOSIS — I4821 Permanent atrial fibrillation: Secondary | ICD-10-CM

## 2017-10-28 DIAGNOSIS — I495 Sick sinus syndrome: Secondary | ICD-10-CM | POA: Diagnosis not present

## 2017-10-28 NOTE — Patient Instructions (Signed)
Medication Instructions:  Continue all current medications.  Labwork: none  Testing/Procedures: none  Follow-Up: 1 year   Any Other Special Instructions Will Be Listed Below (If Applicable).  If you need a refill on your cardiac medications before your next appointment, please call your pharmacy.  

## 2017-10-28 NOTE — Progress Notes (Signed)
PCP: Terald Sleeper, PA-C Primary Cardiologist: Dr Domenic Polite Primary EP:  Dr Rayann Heman  Megan [name] is a 72 y.o. female who presents today for routine electrophysiology followup.  Since last being seen in our clinic, the patient reports doing very well.  Today, she denies symptoms of palpitations, chest pain, shortness of breath,  lower extremity edema, dizziness, presyncope, or syncope.  The patient is otherwise without complaint today.   Past Medical History:  Diagnosis Date  . Acute diastolic heart failure ([place])   . Anxiety state, unspecified   . Congestive heart failure, unspecified   . Degenerative joint disease   . GERD (gastroesophageal reflux disease)   . Hyperlipidemia   . Hypertension   . Lymphedema   . Obesity   . Persistent atrial fibrillation (HCC)     post-termination pauses with afib s/p PPM  . Presence of permanent cardiac pacemaker   . Sinoatrial node dysfunction (HCC)    s/p PPM  . Sleep apnea    compliant with CPAP  . Unspecified hypothyroidism   . Unspecified venous (peripheral) insufficiency    Past Surgical History:  Procedure Laterality Date  . CATARACT EXTRACTION W/PHACO Left 09/09/2014   Procedure: CATARACT EXTRACTION PHACO AND INTRAOCULAR LENS PLACEMENT (IOC);  Surgeon: Tonny Branch, MD;  Location: [facility];  Service: Ophthalmology;  Laterality: Left;  CDE: 6.99  . CATARACT EXTRACTION W/PHACO Right 10/07/2014   Procedure: CATARACT EXTRACTION PHACO AND INTRAOCULAR LENS PLACEMENT RIGHT EYE;  Surgeon: Tonny Branch, MD;  Location: [facility];  Service: Ophthalmology;  Laterality: Right;  CDE:5.16  . PACEMAKER INSERTION     SJM by JA    ROS- all systems are reviewed and negative except as per HPI above  Current Outpatient Medications  Medication Sig Dispense Refill  . acetaminophen (TYLENOL) 500 MG tablet Take 500 mg by mouth every 6 (six) hours as needed for mild pain or moderate pain.    Marland Kitchen albuterol (PROVENTIL HFA;VENTOLIN HFA) 108 (90 Base) MCG/ACT inhaler  Inhale 2 puffs into the lungs every 6 (six) hours as needed for wheezing or shortness of breath. 1 Inhaler 2  . ALPRAZolam (XANAX) 0.25 MG tablet Take 0.25 mg by mouth at bedtime as needed for anxiety or sleep.     Marland Kitchen atorvastatin (LIPITOR) 80 MG tablet Take 1 tablet (80 mg total) by mouth daily. 90 tablet 0  . clotrimazole-betamethasone (LOTRISONE) cream Apply 1 application topically daily as needed (for affected area(S)).    . diltiazem (CARDIZEM CD) 240 MG 24 hr capsule Take 1 capsule (240 mg total) by mouth daily. 90 capsule 3  . levothyroxine (SYNTHROID, LEVOTHROID) 150 MCG tablet Take 1 tablet (150 mcg total) by mouth every morning. 90 tablet 3  . metoprolol tartrate (LOPRESSOR) 100 MG tablet Take 50 mg by mouth 2 (two) times daily.  3  . silver sulfADIAZINE (SILVADENE) 1 % cream Apply 1 application topically daily. 50 g 0  . Vitamin D, Ergocalciferol, (DRISDOL) 50000 units CAPS capsule Take 1 capsule (50,000 Units total) by mouth every 7 (seven) days. Takes on Thursdays. 12 capsule 3  . warfarin (COUMADIN) 3 MG tablet TAKE 1 TABLET BY MOUTH DAILY EXCEPT TAKE 1 AND 1/2 (ONE-HALF) TABLETS ON SUNDAYS, TUESDAYS AND THURSDAYS 45 tablet 4   No current facility-administered medications for this visit.     Physical Exam: Vitals:   10/28/17 1159  BP: 128/80  Pulse: 66  SpO2: 98%  Weight: 283 lb 3.2 oz (128.5 kg)  Height: 5\' 1"  (1.549 m)  GEN- The patient is well appearing, alert and oriented x 3 today.   Head- normocephalic, atraumatic Eyes-  Sclera clear, conjunctiva pink Ears- hearing intact Oropharynx- clear Lungs- Clear to ausculation bilaterally, normal work of breathing Chest- pacemaker pocket is well healed Heart- Regular rate and rhythm, no murmurs, rubs or gallops, PMI not laterally displaced GI- soft, NT, ND, + BS Extremities- no clubbing, cyanosis, or edema  Pacemaker interrogation- reviewed in detail today,  See PACEART report  Assessment and Plan:  1. Symptomatic  bradycardia  Normal pacemaker function See Pace Art report No changes today  2. Permanent afib On coumadin  3. Obesity Body mass index is 53.51 kg/m. Wt Readings from Last 3 Encounters:  10/28/17 283 lb 3.2 oz (128.5 kg)  07/26/17 273 lb 12.8 oz (124.2 kg)  07/05/17 287 lb (130.2 kg)   Not very interested in lifestyle change  4. HTN Stable No change required today  Return in a year  Thompson Grayer MD, [facility] 10/28/2017 12:38 PM

## 2017-11-03 DIAGNOSIS — H26493 Other secondary cataract, bilateral: Secondary | ICD-10-CM | POA: Diagnosis not present

## 2017-11-08 ENCOUNTER — Other Ambulatory Visit: Payer: Self-pay | Admitting: Internal Medicine

## 2017-11-20 LAB — CUP PACEART INCLINIC DEVICE CHECK
Battery Remaining Longevity: 85 mo
Battery Voltage: 2.9 V
Brady Statistic RA Percent Paced: 0 %
Brady Statistic RV Percent Paced: 23 %
Date Time Interrogation Session: 20190802161844
Implantable Lead Implant Date: 20100728
Implantable Lead Location: 753859
Implantable Lead Location: 753860
Implantable Pulse Generator Implant Date: 20100728
Lead Channel Pacing Threshold Amplitude: 1 V
Lead Channel Pacing Threshold Pulse Width: 0.4 ms
Lead Channel Setting Pacing Pulse Width: 0.4 ms
Lead Channel Setting Sensing Sensitivity: 2 mV
MDC IDC LEAD IMPLANT DT: 20100728
MDC IDC MSMT LEADCHNL RV IMPEDANCE VALUE: 412.5 Ohm
MDC IDC MSMT LEADCHNL RV SENSING INTR AMPL: 10.6 mV
MDC IDC SET LEADCHNL RV PACING AMPLITUDE: 2.5 V
Pulse Gen Model: 2210
Pulse Gen Serial Number: 2339600

## 2017-11-24 ENCOUNTER — Encounter: Payer: Self-pay | Admitting: Nurse Practitioner

## 2017-11-24 ENCOUNTER — Ambulatory Visit (INDEPENDENT_AMBULATORY_CARE_PROVIDER_SITE_OTHER): Payer: Medicare Other | Admitting: *Deleted

## 2017-11-24 ENCOUNTER — Ambulatory Visit (INDEPENDENT_AMBULATORY_CARE_PROVIDER_SITE_OTHER): Payer: Medicare Other | Admitting: Nurse Practitioner

## 2017-11-24 ENCOUNTER — Ambulatory Visit (INDEPENDENT_AMBULATORY_CARE_PROVIDER_SITE_OTHER): Payer: Medicare Other

## 2017-11-24 VITALS — BP 139/87 | HR 84 | Temp 96.8°F | Ht 61.0 in | Wt 283.0 lb

## 2017-11-24 DIAGNOSIS — I4891 Unspecified atrial fibrillation: Secondary | ICD-10-CM

## 2017-11-24 DIAGNOSIS — R1084 Generalized abdominal pain: Secondary | ICD-10-CM | POA: Diagnosis not present

## 2017-11-24 DIAGNOSIS — Z5181 Encounter for therapeutic drug level monitoring: Secondary | ICD-10-CM | POA: Diagnosis not present

## 2017-11-24 DIAGNOSIS — R197 Diarrhea, unspecified: Secondary | ICD-10-CM | POA: Diagnosis not present

## 2017-11-24 LAB — POCT INR: INR: 2.1 (ref 2.0–3.0)

## 2017-11-24 NOTE — Patient Instructions (Signed)
Continue 1 tablet daily except 1 1/2 tablets on Mondays Recheck in 3 weeks Continue greens  Please excuse son from work today since he had to bring his mother to the [place] office today.

## 2017-11-24 NOTE — Patient Instructions (Signed)

## 2017-11-24 NOTE — Progress Notes (Signed)
   Subjective:    Patient ID: Megan [name], female    DOB: October 05, 1945, 72 y.o.   MRN: [id number]   Chief Complaint: Abdominal Pain (Comes and goes for 2 weeks); Diarrhea; and Gas   HPI Patient comes in today c/o abdominal pain with gassiness and diarrhea. Started about 2 weeks ago. Is intermittent. Last bowel movement was diarrhea. pepto bismol makes her feel better.   Review of Systems  Respiratory: Negative.   Gastrointestinal: Positive for abdominal pain, diarrhea and nausea. Negative for constipation and vomiting.  Genitourinary: Negative.   Neurological: Negative.   Psychiatric/Behavioral: Negative.   All other systems reviewed and are negative.      Objective:   Physical Exam  Constitutional: She appears well-developed and well-nourished.  Cardiovascular: Normal rate and regular rhythm.  Pulmonary/Chest: Effort normal.  Abdominal: Normal appearance. She exhibits no distension, no ascites and no mass. Bowel sounds are decreased. There is no tenderness. There is no rebound and no guarding.  Skin: Skin is warm and dry.   BP 139/87   Pulse 84   Temp (!) 96.8 F (36 C) (Oral)   Ht 5\' 1"  (1.549 m)   Wt 283 lb (128.4 kg)   BMI 53.47 kg/m   KUB- mild constipation-Preliminary reading by Ronnald Collum, FNP  [facility]       Assessment & Plan:  Megan [name] in today with chief complaint of Abdominal Pain (Comes and goes for 2 weeks); Diarrhea; and Gas   1. Generalized abdominal pain miralax with apple juice for a couple of days Increase fiber in diet - DG Abd 1 View; Future  [name] Done, FNP

## 2017-12-13 ENCOUNTER — Ambulatory Visit (INDEPENDENT_AMBULATORY_CARE_PROVIDER_SITE_OTHER): Payer: Medicare Other | Admitting: *Deleted

## 2017-12-13 DIAGNOSIS — Z5181 Encounter for therapeutic drug level monitoring: Secondary | ICD-10-CM | POA: Diagnosis not present

## 2017-12-13 DIAGNOSIS — I4891 Unspecified atrial fibrillation: Secondary | ICD-10-CM

## 2017-12-13 LAB — POCT INR: INR: 2 (ref 2.0–3.0)

## 2017-12-13 NOTE — Patient Instructions (Signed)
Take coumadin 1 1/2 tablets tonight (missed 1/2 tablet last night) then resume 1 tablet daily except 1 1/2 tablets on Mondays Recheck in 2 weeks Continue greens

## 2017-12-22 DIAGNOSIS — E1140 Type 2 diabetes mellitus with diabetic neuropathy, unspecified: Secondary | ICD-10-CM | POA: Diagnosis not present

## 2017-12-22 DIAGNOSIS — E1151 Type 2 diabetes mellitus with diabetic peripheral angiopathy without gangrene: Secondary | ICD-10-CM | POA: Diagnosis not present

## 2017-12-27 ENCOUNTER — Ambulatory Visit (INDEPENDENT_AMBULATORY_CARE_PROVIDER_SITE_OTHER): Payer: Medicare Other | Admitting: *Deleted

## 2017-12-27 DIAGNOSIS — I4891 Unspecified atrial fibrillation: Secondary | ICD-10-CM

## 2017-12-27 DIAGNOSIS — Z5181 Encounter for therapeutic drug level monitoring: Secondary | ICD-10-CM | POA: Diagnosis not present

## 2017-12-27 LAB — POCT INR: INR: 2.5 (ref 2.0–3.0)

## 2017-12-27 NOTE — Patient Instructions (Signed)
Continue coumadin 1 tablet daily except 1 1/2 tablets on Mondays Recheck in 2 weeks Continue greens

## 2018-01-04 ENCOUNTER — Ambulatory Visit: Payer: Medicare Other

## 2018-01-12 ENCOUNTER — Ambulatory Visit (INDEPENDENT_AMBULATORY_CARE_PROVIDER_SITE_OTHER): Payer: Medicare Other | Admitting: *Deleted

## 2018-01-12 ENCOUNTER — Ambulatory Visit (INDEPENDENT_AMBULATORY_CARE_PROVIDER_SITE_OTHER): Payer: Medicare Other

## 2018-01-12 DIAGNOSIS — Z5181 Encounter for therapeutic drug level monitoring: Secondary | ICD-10-CM | POA: Diagnosis not present

## 2018-01-12 DIAGNOSIS — Z23 Encounter for immunization: Secondary | ICD-10-CM

## 2018-01-12 DIAGNOSIS — I4891 Unspecified atrial fibrillation: Secondary | ICD-10-CM | POA: Diagnosis not present

## 2018-01-12 LAB — POCT INR: INR: 2.7 (ref 2.0–3.0)

## 2018-01-12 NOTE — Progress Notes (Signed)
Please excuse pt's son from work today.  He had to bring his mother to the doctor's office.

## 2018-01-12 NOTE — Patient Instructions (Signed)
Continue coumadin 1 tablet daily except 1 1/2 tablets on Mondays Recheck in 3 weeks Continue greens 

## 2018-01-17 DIAGNOSIS — H353131 Nonexudative age-related macular degeneration, bilateral, early dry stage: Secondary | ICD-10-CM | POA: Diagnosis not present

## 2018-01-17 DIAGNOSIS — H43813 Vitreous degeneration, bilateral: Secondary | ICD-10-CM | POA: Diagnosis not present

## 2018-01-17 DIAGNOSIS — Z961 Presence of intraocular lens: Secondary | ICD-10-CM | POA: Diagnosis not present

## 2018-01-17 DIAGNOSIS — H43393 Other vitreous opacities, bilateral: Secondary | ICD-10-CM | POA: Diagnosis not present

## 2018-01-30 ENCOUNTER — Other Ambulatory Visit: Payer: Self-pay | Admitting: Physician Assistant

## 2018-02-02 ENCOUNTER — Ambulatory Visit (INDEPENDENT_AMBULATORY_CARE_PROVIDER_SITE_OTHER): Payer: Medicare Other | Admitting: *Deleted

## 2018-02-02 DIAGNOSIS — Z5181 Encounter for therapeutic drug level monitoring: Secondary | ICD-10-CM

## 2018-02-02 DIAGNOSIS — I4891 Unspecified atrial fibrillation: Secondary | ICD-10-CM | POA: Diagnosis not present

## 2018-02-02 LAB — POCT INR: INR: 2.3 (ref 2.0–3.0)

## 2018-02-02 NOTE — Patient Instructions (Signed)
Continue coumadin 1 tablet daily except 1 1/2 tablets on Mondays Recheck in 3 weeks Continue greens

## 2018-02-21 ENCOUNTER — Ambulatory Visit (INDEPENDENT_AMBULATORY_CARE_PROVIDER_SITE_OTHER): Payer: Medicare Other | Admitting: *Deleted

## 2018-02-21 DIAGNOSIS — Z5181 Encounter for therapeutic drug level monitoring: Secondary | ICD-10-CM | POA: Diagnosis not present

## 2018-02-21 DIAGNOSIS — I4891 Unspecified atrial fibrillation: Secondary | ICD-10-CM | POA: Diagnosis not present

## 2018-02-21 LAB — POCT INR: INR: 2.4 (ref 2.0–3.0)

## 2018-02-21 NOTE — Patient Instructions (Signed)
Continue coumadin 1 tablet daily except 1 1/2 tablets on Mondays Recheck in 3 weeks Continue greens

## 2018-03-06 ENCOUNTER — Other Ambulatory Visit: Payer: Self-pay | Admitting: Physician Assistant

## 2018-03-14 ENCOUNTER — Ambulatory Visit (INDEPENDENT_AMBULATORY_CARE_PROVIDER_SITE_OTHER): Payer: Medicare Other | Admitting: *Deleted

## 2018-03-14 DIAGNOSIS — I4891 Unspecified atrial fibrillation: Secondary | ICD-10-CM | POA: Diagnosis not present

## 2018-03-14 DIAGNOSIS — Z5181 Encounter for therapeutic drug level monitoring: Secondary | ICD-10-CM | POA: Diagnosis not present

## 2018-03-14 LAB — POCT INR: INR: 2.5 (ref 2.0–3.0)

## 2018-03-14 NOTE — Patient Instructions (Signed)
Continue coumadin 1 tablet daily except 1 1/2 tablets on Mondays Recheck in 4 weeks Continue greens

## 2018-04-06 ENCOUNTER — Ambulatory Visit (INDEPENDENT_AMBULATORY_CARE_PROVIDER_SITE_OTHER): Payer: Medicare Other | Admitting: Pharmacist

## 2018-04-06 DIAGNOSIS — Z5181 Encounter for therapeutic drug level monitoring: Secondary | ICD-10-CM | POA: Diagnosis not present

## 2018-04-06 DIAGNOSIS — I4891 Unspecified atrial fibrillation: Secondary | ICD-10-CM | POA: Diagnosis not present

## 2018-04-06 LAB — POCT INR: INR: 1.6 — AB (ref 2.0–3.0)

## 2018-04-06 NOTE — Patient Instructions (Signed)
Description   Take 1 1/2 tablets tonight then continue coumadin 1 tablet daily except 1 1/2 tablets on Mondays Recheck in 3 weeks Continue greens

## 2018-04-08 ENCOUNTER — Other Ambulatory Visit: Payer: Self-pay | Admitting: Physician Assistant

## 2018-04-10 MED ORDER — LEVOTHYROXINE SODIUM 150 MCG PO TABS: 150.0000 ug | ORAL_TABLET | Freq: Every morning | ORAL | 0 refills | Status: DC

## 2018-04-10 NOTE — Addendum Note (Signed)
Addended by: Marylin Crosby on: 04/10/2018 04:39 PM   Modules accepted: Orders

## 2018-04-10 NOTE — Telephone Encounter (Signed)
Jones. NTBS 30 days given 03/07/18

## 2018-04-10 NOTE — Telephone Encounter (Signed)
Pt scheduled to see AJ 05/01/18 at 10:40 and 30 day supply of levothyroxine sent in. Pt aware needs to keep appt to get further refills.

## 2018-04-24 ENCOUNTER — Other Ambulatory Visit: Payer: Self-pay | Admitting: Physician Assistant

## 2018-04-25 NOTE — Telephone Encounter (Signed)
Last seen 11/24/17

## 2018-04-27 ENCOUNTER — Ambulatory Visit (INDEPENDENT_AMBULATORY_CARE_PROVIDER_SITE_OTHER): Payer: Medicare Other | Admitting: Pharmacist

## 2018-04-27 DIAGNOSIS — I4891 Unspecified atrial fibrillation: Secondary | ICD-10-CM | POA: Diagnosis not present

## 2018-04-27 DIAGNOSIS — Z5181 Encounter for therapeutic drug level monitoring: Secondary | ICD-10-CM

## 2018-04-27 LAB — POCT INR: INR: 2.6 (ref 2.0–3.0)

## 2018-04-27 NOTE — Patient Instructions (Signed)
Description   Eat some greens today then Continue coumadin 1 tablet daily except 1 1/2 tablets on Mondays Recheck in 3 weeks Continue greens

## 2018-04-30 ENCOUNTER — Other Ambulatory Visit: Payer: Self-pay | Admitting: Internal Medicine

## 2018-05-01 ENCOUNTER — Ambulatory Visit (INDEPENDENT_AMBULATORY_CARE_PROVIDER_SITE_OTHER): Payer: Medicare Other | Admitting: Physician Assistant

## 2018-05-01 ENCOUNTER — Encounter: Payer: Self-pay | Admitting: Physician Assistant

## 2018-05-01 VITALS — BP 137/67 | HR 80 | Temp 98.2°F | Ht 61.0 in | Wt 284.6 lb

## 2018-05-01 DIAGNOSIS — I4891 Unspecified atrial fibrillation: Secondary | ICD-10-CM | POA: Diagnosis not present

## 2018-05-01 DIAGNOSIS — E782 Mixed hyperlipidemia: Secondary | ICD-10-CM

## 2018-05-01 DIAGNOSIS — I10 Essential (primary) hypertension: Secondary | ICD-10-CM | POA: Diagnosis not present

## 2018-05-01 DIAGNOSIS — E039 Hypothyroidism, unspecified: Secondary | ICD-10-CM | POA: Diagnosis not present

## 2018-05-01 MED ORDER — ALBUTEROL SULFATE HFA 108 (90 BASE) MCG/ACT IN AERS: 2.0000 | INHALATION_SPRAY | Freq: Four times a day (QID) | RESPIRATORY_TRACT | 3 refills | Status: DC | PRN

## 2018-05-01 MED ORDER — DILTIAZEM HCL ER COATED BEADS 240 MG PO CP24: 240.0000 mg | ORAL_CAPSULE | Freq: Every day | ORAL | 3 refills | Status: DC

## 2018-05-01 MED ORDER — LEVOTHYROXINE SODIUM 150 MCG PO TABS: 150.0000 ug | ORAL_TABLET | Freq: Every morning | ORAL | 3 refills | Status: DC

## 2018-05-01 MED ORDER — ATORVASTATIN CALCIUM 80 MG PO TABS: 80.0000 mg | ORAL_TABLET | Freq: Every day | ORAL | 3 refills | Status: DC

## 2018-05-01 MED ORDER — VITAMIN D (ERGOCALCIFEROL) 1.25 MG (50000 UNIT) PO CAPS: 50000.0000 [IU] | ORAL_CAPSULE | ORAL | 3 refills | Status: DC

## 2018-05-02 LAB — CMP14+EGFR
ALT: 19 IU/L (ref 0–32)
AST: 17 IU/L (ref 0–40)
Albumin/Globulin Ratio: 1.5 (ref 1.2–2.2)
Albumin: 3.9 g/dL (ref 3.7–4.7)
Alkaline Phosphatase: 166 IU/L — ABNORMAL HIGH (ref 39–117)
BUN/Creatinine Ratio: 14 (ref 12–28)
BUN: 15 mg/dL (ref 8–27)
Bilirubin Total: 1.3 mg/dL — ABNORMAL HIGH (ref 0.0–1.2)
CO2: 19 mmol/L — AB (ref 20–29)
CREATININE: 1.04 mg/dL — AB (ref 0.57–1.00)
Calcium: 9.4 mg/dL (ref 8.7–10.3)
Chloride: 102 mmol/L (ref 96–106)
GFR calc Af Amer: 61 mL/min/{1.73_m2} (ref >59)
GFR, EST NON AFRICAN AMERICAN: 53 mL/min/{1.73_m2} — AB (ref >59)
Globulin, Total: 2.6 g/dL (ref 1.5–4.5)
Glucose: 130 mg/dL — ABNORMAL HIGH (ref 65–99)
Potassium: 4.3 mmol/L (ref 3.5–5.2)
Sodium: 142 mmol/L (ref 134–144)
Total Protein: 6.5 g/dL (ref 6.0–8.5)

## 2018-05-02 LAB — CBC WITH DIFFERENTIAL/PLATELET
Basophils Absolute: 0.1 10*3/uL (ref 0.0–0.2)
Basos: 1 %
EOS (ABSOLUTE): 0.1 10*3/uL (ref 0.0–0.4)
EOS: 1 %
Hematocrit: 42.1 % (ref 34.0–46.6)
Hemoglobin: 14.7 g/dL (ref 11.1–15.9)
Immature Grans (Abs): 0.1 10*3/uL (ref 0.0–0.1)
Immature Granulocytes: 1 %
Lymphocytes Absolute: 1.5 10*3/uL (ref 0.7–3.1)
Lymphs: 17 %
MCH: 32 pg (ref 26.6–33.0)
MCHC: 34.9 g/dL (ref 31.5–35.7)
MCV: 92 fL (ref 79–97)
MONOCYTES: 8 %
Monocytes Absolute: 0.8 10*3/uL (ref 0.1–0.9)
Neutrophils Absolute: 6.6 10*3/uL (ref 1.4–7.0)
Neutrophils: 72 %
Platelets: 299 10*3/uL (ref 150–450)
RBC: 4.59 x10E6/uL (ref 3.77–5.28)
RDW: 13.6 % (ref 11.7–15.4)
WBC: 9.1 10*3/uL (ref 3.4–10.8)

## 2018-05-02 LAB — LIPID PANEL
CHOL/HDL RATIO: 4.1 ratio (ref 0.0–4.4)
Cholesterol, Total: 217 mg/dL — ABNORMAL HIGH (ref 100–199)
HDL: 53 mg/dL (ref >39)
LDL Calculated: 126 mg/dL — ABNORMAL HIGH (ref 0–99)
Triglycerides: 192 mg/dL — ABNORMAL HIGH (ref 0–149)
VLDL Cholesterol Cal: 38 mg/dL (ref 5–40)

## 2018-05-02 LAB — TSH: TSH: 1.25 u[IU]/mL (ref 0.450–4.500)

## 2018-05-02 NOTE — Progress Notes (Addendum)
BP 137/67   Pulse 80   Temp 98.2 F (36.8 C) (Oral)   Ht '5\' 1"'  (1.549 m)   Wt 284 lb 9.6 oz (129.1 kg)   BMI 53.77 kg/m    Subjective:    Patient ID: Megan [name], female    DOB: 10/19/1945, 73 y.o.   MRN: [id number]  HPI: Megan [name] is a 73 y.o. female presenting on 05/01/2018 for Hyperlipidemia (6 month ); Hypertension; Hypothyroidism; and Diabetes  This patient comes in for periodic recheck on medications and conditions including atrial fibrillation, hypertension, hypothyroidism, hyperlipidemia.   All medications are reviewed today. There are no reports of any problems with the medications. All of the medical conditions are reviewed and updated.  Lab work is reviewed and will be ordered as medically necessary. There are no new problems reported with today's visit.   Past Medical History:  Diagnosis Date  . Acute diastolic heart failure ([place])   . Anxiety state, unspecified   . Congestive heart failure, unspecified   . Degenerative joint disease   . GERD (gastroesophageal reflux disease)   . Hyperlipidemia   . Hypertension   . Lymphedema   . Obesity   . Persistent atrial fibrillation     post-termination pauses with afib s/p PPM  . Presence of permanent cardiac pacemaker   . Sinoatrial node dysfunction (HCC)    s/p PPM  . Sleep apnea    compliant with CPAP  . Unspecified hypothyroidism   . Unspecified venous (peripheral) insufficiency    Relevant past medical, surgical, family and social history reviewed and updated as indicated. Interim medical history since our last visit reviewed. Allergies and medications reviewed and updated. DATA REVIEWED: CHART IN EPIC  Family History reviewed for pertinent findings.  Review of Systems  Constitutional: Negative.  Negative for activity change, fatigue and fever.  HENT: Negative.   Eyes: Negative.   Respiratory: Negative.  Negative for cough.   Cardiovascular: Negative.  Negative for chest pain.  Gastrointestinal:  Negative.  Negative for abdominal pain.  Endocrine: Negative.   Genitourinary: Negative.  Negative for dysuria.  Musculoskeletal: Positive for arthralgias and joint swelling.  Skin: Negative.   Neurological: Negative.     Allergies as of 05/01/2018   No Known Allergies     Medication List       Accurate as of May 01, 2018 11:59 PM. Always use your most recent med list.        albuterol 108 (90 Base) MCG/ACT inhaler Commonly known as:  PROVENTIL HFA;VENTOLIN HFA Inhale 2 puffs into the lungs every 6 (six) hours as needed for wheezing or shortness of breath.   atorvastatin 80 MG tablet Commonly known as:  LIPITOR Take 1 tablet (80 mg total) by mouth daily.   clotrimazole-betamethasone cream Commonly known as:  LOTRISONE Apply 1 application topically daily as needed (for affected area(S)).   diltiazem 240 MG 24 hr capsule Commonly known as:  CARDIZEM CD Take 1 capsule (240 mg total) by mouth daily.   levothyroxine 150 MCG tablet Commonly known as:  SYNTHROID, LEVOTHROID Take 1 tablet (150 mcg total) by mouth every morning. (Needs to be seen before next refill)   metoprolol tartrate 100 MG tablet Commonly known as:  LOPRESSOR Take 50 mg by mouth 2 (two) times daily.   Vitamin D (Ergocalciferol) 1.25 MG (50000 UT) Caps capsule Commonly known as:  DRISDOL Take 1 capsule (50,000 Units total) by mouth every 7 (seven) days. Takes on Thursdays.   warfarin  3 MG tablet Commonly known as:  COUMADIN Take as directed by the anticoagulation clinic. If you are unsure how to take this medication, talk to your nurse or doctor. Original instructions:  TAKE 1 TABLET BY MOUTH ONCE DAILY EXCEPT  TAKE  1  AND  1/2  TABLETS  ON  MONDAYS          Objective:    BP 137/67   Pulse 80   Temp 98.2 F (36.8 C) (Oral)   Ht '5\' 1"'  (1.549 m)   Wt 284 lb 9.6 oz (129.1 kg)   BMI 53.77 kg/m   No Known Allergies  Wt Readings from Last 3 Encounters:  05/01/18 284 lb 9.6 oz (129.1 kg)    11/24/17 283 lb (128.4 kg)  10/28/17 283 lb 3.2 oz (128.5 kg)    Physical Exam Constitutional:      Appearance: She is well-developed.  HENT:     Head: Normocephalic and atraumatic.  Eyes:     Conjunctiva/sclera: Conjunctivae normal.     Pupils: Pupils are equal, round, and reactive to light.  Cardiovascular:     Rate and Rhythm: Normal rate and regular rhythm.     Heart sounds: Normal heart sounds.  Pulmonary:     Effort: Pulmonary effort is normal.     Breath sounds: Normal breath sounds.  Abdominal:     General: Bowel sounds are normal.     Palpations: Abdomen is soft.  Skin:    General: Skin is warm and dry.     Findings: No rash.  Neurological:     Mental Status: She is alert and oriented to person, place, and time.     Deep Tendon Reflexes: Reflexes are normal and symmetric.  Psychiatric:        Behavior: Behavior normal.        Thought Content: Thought content normal.        Judgment: Judgment normal.     Results for orders placed or performed in visit on 05/01/18  CBC with Differential/Platelet  Result Value Ref Range   WBC 9.1 3.4 - 10.8 x10E3/uL   RBC 4.59 3.77 - 5.28 x10E6/uL   Hemoglobin 14.7 11.1 - 15.9 g/dL   Hematocrit 42.1 34.0 - 46.6 %   MCV 92 79 - 97 fL   MCH 32.0 26.6 - 33.0 pg   MCHC 34.9 31.5 - 35.7 g/dL   RDW 13.6 11.7 - 15.4 %   Platelets 299 150 - 450 x10E3/uL   Neutrophils 72 Not Estab. %   Lymphs 17 Not Estab. %   Monocytes 8 Not Estab. %   Eos 1 Not Estab. %   Basos 1 Not Estab. %   Neutrophils Absolute 6.6 1.4 - 7.0 x10E3/uL   Lymphocytes Absolute 1.5 0.7 - 3.1 x10E3/uL   Monocytes Absolute 0.8 0.1 - 0.9 x10E3/uL   EOS (ABSOLUTE) 0.1 0.0 - 0.4 x10E3/uL   Basophils Absolute 0.1 0.0 - 0.2 x10E3/uL   Immature Granulocytes 1 Not Estab. %   Immature Grans (Abs) 0.1 0.0 - 0.1 x10E3/uL  CMP14+EGFR  Result Value Ref Range   Glucose 130 (H) 65 - 99 mg/dL   BUN 15 8 - 27 mg/dL   Creatinine, Ser 1.04 (H) 0.57 - 1.00 mg/dL   GFR calc  non Af Amer 53 (L) >59 mL/min/1.73   GFR calc Af Amer 61 >59 mL/min/1.73   BUN/Creatinine Ratio 14 12 - 28   Sodium 142 134 - 144 mmol/L   Potassium 4.3 3.5 -  5.2 mmol/L   Chloride 102 96 - 106 mmol/L   CO2 19 (L) 20 - 29 mmol/L   Calcium 9.4 8.7 - 10.3 mg/dL   Total Protein 6.5 6.0 - 8.5 g/dL   Albumin 3.9 3.7 - 4.7 g/dL   Globulin, Total 2.6 1.5 - 4.5 g/dL   Albumin/Globulin Ratio 1.5 1.2 - 2.2   Bilirubin Total 1.3 (H) 0.0 - 1.2 mg/dL   Alkaline Phosphatase 166 (H) 39 - 117 IU/L   AST 17 0 - 40 IU/L   ALT 19 0 - 32 IU/L  Lipid panel  Result Value Ref Range   Cholesterol, Total 217 (H) 100 - 199 mg/dL   Triglycerides 192 (H) 0 - 149 mg/dL   HDL 53 >39 mg/dL   VLDL Cholesterol Cal 38 5 - 40 mg/dL   LDL Calculated 126 (H) 0 - 99 mg/dL   Chol/HDL Ratio 4.1 0.0 - 4.4 ratio  TSH  Result Value Ref Range   TSH 1.250 0.450 - 4.500 uIU/mL      Assessment & Plan:   1. Atrial fibrillation, unspecified type (HCC) - diltiazem (CARDIZEM CD) 240 MG 24 hr capsule; Take 1 capsule (240 mg total) by mouth daily.  Dispense: 90 capsule; Refill: 3 - CBC with Differential/Platelet - CMP14+EGFR  2. HYPERTENSION, BENIGN - CBC with Differential/Platelet - CMP14+EGFR  3. Acquired hypothyroidism - levothyroxine (SYNTHROID, LEVOTHROID) 150 MCG tablet; Take 1 tablet (150 mcg total) by mouth every morning. (Needs to be seen before next refill)  Dispense: 90 tablet; Refill: 3 - TSH  4. Mixed hyperlipidemia - atorvastatin (LIPITOR) 80 MG tablet; Take 1 tablet (80 mg total) by mouth daily.  Dispense: 90 tablet; Refill: 3 - Lipid panel   Continue all other maintenance medications as listed above.  Follow up plan: Return in about 6 months (around 10/30/2018) for recheck.  Educational handout given for [place] PA-C [place] [address]  [place], [place] [address] [phone]   05/02/2018, 8:19 PM

## 2018-05-18 ENCOUNTER — Ambulatory Visit (INDEPENDENT_AMBULATORY_CARE_PROVIDER_SITE_OTHER): Payer: Medicare Other | Admitting: *Deleted

## 2018-05-18 DIAGNOSIS — I4891 Unspecified atrial fibrillation: Secondary | ICD-10-CM | POA: Diagnosis not present

## 2018-05-18 DIAGNOSIS — Z5181 Encounter for therapeutic drug level monitoring: Secondary | ICD-10-CM | POA: Diagnosis not present

## 2018-05-18 LAB — POCT INR: INR: 2.7 (ref 2.0–3.0)

## 2018-05-18 NOTE — Patient Instructions (Signed)
Eat greens today then resume 1 tablet daily except 1 1/2 tablets on Mondays Recheck in 3 weeks Increase greens by 1 extra serving per week

## 2018-05-30 ENCOUNTER — Telehealth: Payer: Self-pay | Admitting: Physician Assistant

## 2018-05-30 DIAGNOSIS — I4891 Unspecified atrial fibrillation: Secondary | ICD-10-CM

## 2018-05-30 MED ORDER — DILTIAZEM HCL ER COATED BEADS 240 MG PO CP24: 240.0000 mg | ORAL_CAPSULE | Freq: Every day | ORAL | 0 refills | Status: DC

## 2018-05-30 NOTE — Telephone Encounter (Signed)
Rx sent to [facility] per patient request

## 2018-06-01 ENCOUNTER — Other Ambulatory Visit: Payer: Self-pay | Admitting: Internal Medicine

## 2018-06-01 NOTE — Telephone Encounter (Signed)
This is a [facility] pt °

## 2018-06-07 ENCOUNTER — Ambulatory Visit: Payer: Self-pay | Admitting: *Deleted

## 2018-06-07 ENCOUNTER — Telehealth: Payer: Self-pay | Admitting: *Deleted

## 2018-06-07 NOTE — Telephone Encounter (Signed)
Appointment changed for pt.to work with her transportation issues.

## 2018-06-07 NOTE — Telephone Encounter (Signed)
Patient called stating that she is having transportation issues getting to [facility] . She would like to speak with Edrick Oh, RN to see if something can be worked out for patient to come to the [employer].  Please call patient.

## 2018-06-13 ENCOUNTER — Ambulatory Visit (INDEPENDENT_AMBULATORY_CARE_PROVIDER_SITE_OTHER): Payer: Medicare Other | Admitting: *Deleted

## 2018-06-13 ENCOUNTER — Other Ambulatory Visit: Payer: Self-pay

## 2018-06-13 DIAGNOSIS — I4891 Unspecified atrial fibrillation: Secondary | ICD-10-CM | POA: Diagnosis not present

## 2018-06-13 DIAGNOSIS — Z5181 Encounter for therapeutic drug level monitoring: Secondary | ICD-10-CM | POA: Diagnosis not present

## 2018-06-13 LAB — POCT INR: INR: 2.9 (ref 2.0–3.0)

## 2018-06-13 NOTE — Patient Instructions (Signed)
Take coumadin 1/2 tablet tonight then decrease dose to 1 tablet daily Recheck in 3 weeks Continue greens

## 2018-06-25 ENCOUNTER — Other Ambulatory Visit: Payer: Self-pay

## 2018-06-25 ENCOUNTER — Emergency Department (HOSPITAL_COMMUNITY)
Admission: EM | Admit: 2018-06-25 | Discharge: 2018-06-25 | Disposition: A | Discharge status: Home / Self Care | Payer: Medicare Other | Attending: Emergency Medicine | Admitting: Emergency Medicine

## 2018-06-25 ENCOUNTER — Encounter (HOSPITAL_COMMUNITY): Payer: Self-pay | Admitting: Emergency Medicine

## 2018-06-25 DIAGNOSIS — I509 Heart failure, unspecified: Secondary | ICD-10-CM | POA: Diagnosis not present

## 2018-06-25 DIAGNOSIS — E039 Hypothyroidism, unspecified: Secondary | ICD-10-CM | POA: Diagnosis not present

## 2018-06-25 DIAGNOSIS — I110 Hypertensive heart disease with heart failure: Secondary | ICD-10-CM | POA: Diagnosis not present

## 2018-06-25 DIAGNOSIS — Z7901 Long term (current) use of anticoagulants: Secondary | ICD-10-CM | POA: Diagnosis not present

## 2018-06-25 DIAGNOSIS — J209 Acute bronchitis, unspecified: Secondary | ICD-10-CM

## 2018-06-25 DIAGNOSIS — Z87891 Personal history of nicotine dependence: Secondary | ICD-10-CM | POA: Insufficient documentation

## 2018-06-25 DIAGNOSIS — R0602 Shortness of breath: Secondary | ICD-10-CM | POA: Diagnosis not present

## 2018-06-25 DIAGNOSIS — Z950 Presence of cardiac pacemaker: Secondary | ICD-10-CM | POA: Diagnosis not present

## 2018-06-25 DIAGNOSIS — Z79899 Other long term (current) drug therapy: Secondary | ICD-10-CM | POA: Insufficient documentation

## 2018-06-25 MED ORDER — AMOXICILLIN-POT CLAVULANATE 875-125 MG PO TABS: 1.0000 | ORAL_TABLET | Freq: Two times a day (BID) | ORAL | 0 refills | Status: DC

## 2018-06-25 MED ORDER — AMOXICILLIN-POT CLAVULANATE 875-125 MG PO TABS
1.0000 | ORAL_TABLET | Freq: Once | ORAL | Status: AC
  Administered 2018-06-25: 1 via ORAL
  Filled 2018-06-25: qty 1

## 2018-06-25 MED ORDER — PREDNISONE 50 MG PO TABS
60.0000 mg | ORAL_TABLET | Freq: Once | ORAL | Status: AC
  Administered 2018-06-25: 60 mg via ORAL
  Filled 2018-06-25: qty 1

## 2018-06-25 NOTE — ED Provider Notes (Signed)
[facility] EMERGENCY DEPARTMENT Provider Note   CSN: [id number] Arrival date & time: 06/25/18  1332    History   Chief Complaint Chief Complaint  Patient presents with  . Shortness of Breath    HPI Megan [name] is a 73 y.o. female.     HPI   She presents for evaluation of cough, with sputum production, initially clear and then a single episode of yellow sputum, yesterday.  She has ongoing shortness of breath for which she is using albuterol.  She used her inhaler twice today.  She typically uses albuterol 3 out of 4 weeks of the month.  She does not know if she has emphysema.  She denies history of asthma.  She denies fever, chills, chest pain, back pain, dizziness, change in chronic leg swelling.  No known sick contacts.  There are no other known modifying factors.  Past Medical History:  Diagnosis Date  . Acute diastolic heart failure ([place])   . Anxiety state, unspecified   . Congestive heart failure, unspecified   . Degenerative joint disease   . GERD (gastroesophageal reflux disease)   . Hyperlipidemia   . Hypertension   . Lymphedema   . Obesity   . Persistent atrial fibrillation     post-termination pauses with afib s/p PPM  . Presence of permanent cardiac pacemaker   . Sinoatrial node dysfunction (HCC)    s/p PPM  . Sleep apnea    compliant with CPAP  . Unspecified hypothyroidism   . Unspecified venous (peripheral) insufficiency     Patient Active Problem List   Diagnosis Date Noted  . Bronchitis 07/05/2017  . Intertrigo 05/11/2017  . OAB (overactive bladder) 02/22/2017  . Lymphedema 01/26/2017  . Gastroesophageal reflux disease without esophagitis 01/26/2017  . Acquired hypothyroidism 01/26/2017  . Mixed hyperlipidemia 01/26/2017  . Pyrexia   . Sleeps in sitting position due to orthopnea 07/31/2015  . Acute on chronic systolic congestive heart failure ([place]) 06/09/2015  . Morbid obesity due to excess calories ([place]) 06/09/2015  . PVD (peripheral  vascular disease) with claudication ([place]) 06/09/2015  . OSA (obstructive sleep apnea) 06/09/2015  . Long term (current) use of anticoagulants 06/19/2010  . Tachycardia-bradycardia syndrome ([place]) 11/18/2008  . PPM-[place] 11/18/2008  . Hyperlipidemia associated with type 2 diabetes mellitus ([place]) 02/21/2008  . HYPERTENSION, BENIGN 02/21/2008  . ATRIAL FIBRILLATION 02/21/2008    Past Surgical History:  Procedure Laterality Date  . CATARACT EXTRACTION W/PHACO Left 09/09/2014   Procedure: CATARACT EXTRACTION PHACO AND INTRAOCULAR LENS PLACEMENT (IOC);  Surgeon: Tonny Branch, MD;  Location: [facility];  Service: Ophthalmology;  Laterality: Left;  CDE: 6.99  . CATARACT EXTRACTION W/PHACO Right 10/07/2014   Procedure: CATARACT EXTRACTION PHACO AND INTRAOCULAR LENS PLACEMENT RIGHT EYE;  Surgeon: Tonny Branch, MD;  Location: [facility];  Service: Ophthalmology;  Laterality: Right;  CDE:5.16  . PACEMAKER INSERTION     SJM by JA     OB History   No obstetric history on file.      Home Medications    Prior to Admission medications   Medication Sig Start Date End Date Taking? Authorizing Provider  albuterol (PROVENTIL HFA;VENTOLIN HFA) 108 (90 Base) MCG/ACT inhaler Inhale 2 puffs into the lungs every 6 (six) hours as needed for wheezing or shortness of breath. 05/01/18   Terald Sleeper, PA-C  amoxicillin-clavulanate (AUGMENTIN) 875-125 MG tablet Take 1 tablet by mouth 2 (two) times daily. One po bid x 7 days 06/25/18   Daleen Bo, MD  atorvastatin (  LIPITOR) 80 MG tablet Take 1 tablet (80 mg total) by mouth daily. 05/01/18   Terald Sleeper, PA-C  clotrimazole-betamethasone (LOTRISONE) cream Apply 1 application topically daily as needed (for affected area(S)).    [provider]  diltiazem (CARDIZEM CD) 240 MG 24 hr capsule Take 1 capsule (240 mg total) by mouth daily. 05/30/18   Terald Sleeper, PA-C  levothyroxine (SYNTHROID, LEVOTHROID) 150 MCG tablet Take 1 tablet (150 mcg total) by mouth every  morning. (Needs to be seen before next refill) 05/01/18   Terald Sleeper, PA-C  metoprolol tartrate (LOPRESSOR) 100 MG tablet Take 1/2 (one-half) tablet by mouth twice daily 06/01/18   Allred, Jeneen Rinks, MD  Vitamin D, Ergocalciferol, (DRISDOL) 1.25 MG (50000 UT) CAPS capsule Take 1 capsule (50,000 Units total) by mouth every 7 (seven) days. Takes on Thursdays. 05/01/18   Terald Sleeper, PA-C  warfarin (COUMADIN) 3 MG tablet TAKE 1 TABLET BY MOUTH ONCE DAILY EXCEPT  TAKE  1  AND  1/2  TABLETS  ON  MONDAYS 05/01/18   Allred, Jeneen Rinks, MD    Family History Family History  Problem Relation Age of Onset  . Parkinson's disease Other   . CVA Other   . Parkinson's disease Father     Social History Social History   Tobacco Use  . Smoking status: Former Smoker    Packs/day: 0.50    Years: 10.00    Pack years: 5.00    Types: Cigarettes    Last attempt to quit: 03/29/1978    Years since quitting: 40.2  . Smokeless tobacco: Never Used  Substance Use Topics  . Alcohol use: No    Alcohol/week: 0.0 standard drinks  . Drug use: No     Allergies   Patient has no known allergies.   Review of Systems Review of Systems  All other systems reviewed and are negative.    Physical Exam Updated Vital Signs BP (!) 156/54   Pulse 84   Temp 97.8 F (36.6 C) (Oral)   Resp (!) 22   Ht 5' (1.524 m)   Wt 136.1 kg   SpO2 92%   BMI 58.59 kg/m   Physical Exam Vitals signs and nursing note reviewed.  Constitutional:      General: She is not in acute distress.    Appearance: She is well-developed. She is obese. She is not ill-appearing, toxic-appearing or diaphoretic.  HENT:     Head: Normocephalic and atraumatic.     Right Ear: External ear normal.     Left Ear: External ear normal.  Eyes:     Conjunctiva/sclera: Conjunctivae normal.     Pupils: Pupils are equal, round, and reactive to light.  Neck:     Musculoskeletal: Normal range of motion and neck supple.     Trachea: Phonation normal.   Cardiovascular:     Rate and Rhythm: Normal rate and regular rhythm.     Heart sounds: Normal heart sounds.  Pulmonary:     Effort: Pulmonary effort is normal. No respiratory distress.     Breath sounds: No stridor. Rhonchi (Few scattered) present. No wheezing.  Abdominal:     General: There is no distension.     Palpations: Abdomen is soft. There is no mass.     Tenderness: There is no abdominal tenderness.  Musculoskeletal: Normal range of motion.     Right lower leg: Edema present.     Left lower leg: Edema present.     Comments: Lymphedema bilateral lower  legs  Skin:    General: Skin is warm and dry.  Neurological:     Mental Status: She is alert and oriented to person, place, and time.     Cranial Nerves: No cranial nerve deficit.     Sensory: No sensory deficit.     Motor: No abnormal muscle tone.     Coordination: Coordination normal.  Psychiatric:        Mood and Affect: Mood normal.        Behavior: Behavior normal.        Thought Content: Thought content normal.        Judgment: Judgment normal.      ED Treatments / Results  Labs (all labs ordered are listed, but only abnormal results are displayed) Labs Reviewed - No data to display  EKG EKG Interpretation  Date/Time:  "Sunday June 25 2018 13:35:42 EDT Ventricular Rate:  95 PR Interval:    QRS Duration: 97 QT Interval:  341 QTC Calculation: 396 R Axis:   89 Text Interpretation:  Atrial fibrillation Ventricular premature complex Borderline right axis deviation Low voltage, precordial leads Nonspecific repol abnormality, diffuse leads Since last tracing pvc are new Confirmed by Chozen Latulippe ([id number]) on 06/25/2018 1:40:03 PM   Radiology No results found.  Procedures Procedures (including critical care time)  Medications Ordered in ED Medications  predniSONE (DELTASONE) tablet 60 mg (60 mg Oral Given 06/25/18 1405)  amoxicillin-clavulanate (AUGMENTIN) 875-125 MG per tablet 1 tablet (1 tablet Oral Given  06/25/18 1405)     Initial Impression / Assessment and Plan / ED Course  I have reviewed the triage vital signs and the nursing notes.  Pertinent labs & imaging results that were available during my care of the patient were reviewed by me and considered in my medical decision making (see chart for details).         Patient Vitals for the past 24 hrs:  BP Temp Temp src Pulse Resp SpO2 Height Weight  06/25/18 1400 (!) 156/54 - - 84 (!) 22 92 % - -  06/25/18 1336 (!) 155/82 97.8 F (36.6 C) Oral 88 (!) 26 91 % - -  06/25/18 1334 - - - - - - 5' (1.524 m) 136.1 kg     2:27 PM Reevaluation with update and discussion. After initial assessment and treatment, an updated evaluation reveals no change in clinical status.  Findings discussed and questions answered. Lulubelle Simcoe   Medical Decision Making: Evaluation consistent with recurrent bronchitis.  Suspect COPD.  Doubt pneumonia, serious bacterial infection, metabolic instability or impending vascular collapse.  CRITICAL CARE-no Performed by: Sherle Mello   Nursing Notes Reviewed/ Care Coordinated Applicable Imaging Reviewed Interpretation of Laboratory Data incorporated into ED treatment  The patient appears reasonably screened and/or stabilized for discharge and I doubt any other medical condition or other EMC requiring further screening, evaluation, or treatment in the ED at this time prior to discharge.  Plan: Home Medications-continue usual increased frequency of albuterol to 4-6 times a day; Home Treatments-rest, fluids; return here if the recommended treatment, does not improve the symptoms; Recommended follow up-PCP 1 week for checkup.     Final Clinical Impressions(s) / ED Diagnoses   Final diagnoses:  Acute bronchitis, unspecified organism    ED Discharge Orders         Ordered    amoxicillin-clavulanate (AUGMENTIN) 875-125 MG tablet  2 times daily     03" /29/20 1430           Eulis Foster,  Vira Agar, MD 06/25/18 1431

## 2018-06-25 NOTE — Discharge Instructions (Signed)
We sent a prescription to your pharmacy to take to treat bronchitis.  For cough use Robitussin-DM, and make sure you are drinking plenty of water.  Use your albuterol inhaler 4-6 times a day as needed for cough or trouble breathing.  See your doctor for checkup in 1 week.

## 2018-06-25 NOTE — ED Triage Notes (Signed)
Pt states she has been sick since yesterday with a productive  Cough and sob.  Son was sick this week with same symptoms, but she says he is better now.  Used inhaler this am, no nebs.

## 2018-06-28 ENCOUNTER — Other Ambulatory Visit: Payer: Self-pay | Admitting: Physician Assistant

## 2018-06-28 ENCOUNTER — Telehealth: Payer: Self-pay | Admitting: Physician Assistant

## 2018-06-28 MED ORDER — CEFDINIR 300 MG PO CAPS: 300.0000 mg | ORAL_CAPSULE | Freq: Two times a day (BID) | ORAL | 0 refills | Status: DC

## 2018-06-28 NOTE — Telephone Encounter (Signed)
It is probably the Augmentin that is bothering her stomach.  I will send in a different antibiotic for her.  Please just have her check her pharmacy later today.

## 2018-06-28 NOTE — Telephone Encounter (Signed)
Omnicef 300 mg 1 tablet twice daily sent to her pharmacy

## 2018-06-28 NOTE — Telephone Encounter (Signed)
Pt went to [facility] Sunday with cough/congestion. Dx with Bronchitis. They sent in Augmentin. Pt started it Sunday. Pt started getting sick on stomach with Nauseated denies throwing up. Diarrhea. Pt states she is staying hydrated. Can not get to the bathroom in time. Patient wants to know what else she can do. Please advise.

## 2018-06-28 NOTE — Telephone Encounter (Signed)
Patient aware and verbalizes understanding. 

## 2018-06-30 ENCOUNTER — Telehealth: Payer: Self-pay | Admitting: Physician Assistant

## 2018-06-30 NOTE — Telephone Encounter (Signed)
Spoke with pt and she had questions regarding her recent diagnosis of bronchitis at the ED 3/29. Pt states she doesn't have a fever and denies SOB but states she is still having cough and coughing up phlegm. Advised pt to continue her medications and monitor her temp and if she develops fever or SOB to call us back. Advised pt to stay out of crowds and only go out if absolutely necessary and limit people that visit her. Pt voiced understanding.

## 2018-07-04 ENCOUNTER — Telehealth: Payer: Self-pay | Admitting: *Deleted

## 2018-07-04 NOTE — Telephone Encounter (Signed)
° ° ° °  COVID-19 Pre-Screening Questions: ° °• Do you currently have a fever? No °•  °• Have you recently travelled on a cruise, internationally, or to [place], [place], [place], [place], [place], or [place], [place] ([employer]) ? No °•  °• Have you been in contact with someone that is currently pending confirmation of Covid19 testing or has been confirmed to have the Covid19 virus? No °•  °• Are you currently experiencing fatigue or cough? No  ° ° °   ° ° ° ° ° °

## 2018-07-05 ENCOUNTER — Ambulatory Visit (INDEPENDENT_AMBULATORY_CARE_PROVIDER_SITE_OTHER): Payer: Medicare Other | Admitting: *Deleted

## 2018-07-05 DIAGNOSIS — I4891 Unspecified atrial fibrillation: Secondary | ICD-10-CM

## 2018-07-05 DIAGNOSIS — Z5181 Encounter for therapeutic drug level monitoring: Secondary | ICD-10-CM

## 2018-07-05 LAB — POCT INR: INR: 2.3 (ref 2.0–3.0)

## 2018-07-05 NOTE — Patient Instructions (Signed)
Continue coumadin 1 tablet daily Recheck in 4 weeks Continue greens

## 2018-07-28 ENCOUNTER — Telehealth: Payer: Self-pay | Admitting: Physician Assistant

## 2018-07-31 ENCOUNTER — Telehealth: Payer: Self-pay | Admitting: *Deleted

## 2018-07-31 NOTE — Telephone Encounter (Signed)

## 2018-08-02 ENCOUNTER — Ambulatory Visit (INDEPENDENT_AMBULATORY_CARE_PROVIDER_SITE_OTHER): Payer: Medicare Other | Admitting: *Deleted

## 2018-08-02 DIAGNOSIS — I4891 Unspecified atrial fibrillation: Secondary | ICD-10-CM | POA: Diagnosis not present

## 2018-08-02 DIAGNOSIS — Z5181 Encounter for therapeutic drug level monitoring: Secondary | ICD-10-CM

## 2018-08-02 LAB — POCT INR: INR: 2.3 (ref 2.0–3.0)

## 2018-08-02 NOTE — Patient Instructions (Signed)
Continue coumadin 1 tablet daily Recheck in 4 weeks Continue greens

## 2018-08-29 ENCOUNTER — Telehealth: Payer: Self-pay | Admitting: *Deleted

## 2018-08-29 NOTE — Telephone Encounter (Signed)

## 2018-08-30 ENCOUNTER — Ambulatory Visit (INDEPENDENT_AMBULATORY_CARE_PROVIDER_SITE_OTHER): Payer: Medicare Other | Admitting: *Deleted

## 2018-08-30 DIAGNOSIS — I4891 Unspecified atrial fibrillation: Secondary | ICD-10-CM | POA: Diagnosis not present

## 2018-08-30 DIAGNOSIS — Z5181 Encounter for therapeutic drug level monitoring: Secondary | ICD-10-CM | POA: Diagnosis not present

## 2018-08-30 LAB — POCT INR: INR: 2.4 (ref 2.0–3.0)

## 2018-08-30 NOTE — Patient Instructions (Signed)
Continue coumadin 1 tablet daily Recheck in 3 weeks Continue greens

## 2018-09-07 ENCOUNTER — Other Ambulatory Visit: Payer: Self-pay | Admitting: Internal Medicine

## 2018-09-07 DIAGNOSIS — E1151 Type 2 diabetes mellitus with diabetic peripheral angiopathy without gangrene: Secondary | ICD-10-CM | POA: Diagnosis not present

## 2018-09-07 DIAGNOSIS — E1140 Type 2 diabetes mellitus with diabetic neuropathy, unspecified: Secondary | ICD-10-CM | POA: Diagnosis not present

## 2018-09-07 MED ORDER — WARFARIN SODIUM 3 MG PO TABS: ORAL_TABLET | ORAL | 0 refills | Status: DC

## 2018-09-11 ENCOUNTER — Other Ambulatory Visit: Payer: Self-pay | Admitting: Internal Medicine

## 2018-09-13 ENCOUNTER — Encounter: Payer: Self-pay | Admitting: Family Medicine

## 2018-09-13 ENCOUNTER — Other Ambulatory Visit: Payer: Self-pay

## 2018-09-13 ENCOUNTER — Ambulatory Visit (INDEPENDENT_AMBULATORY_CARE_PROVIDER_SITE_OTHER): Payer: Medicare Other | Admitting: Family Medicine

## 2018-09-13 VITALS — BP 135/83 | HR 67 | Temp 97.5°F

## 2018-09-13 DIAGNOSIS — S81812A Laceration without foreign body, left lower leg, initial encounter: Secondary | ICD-10-CM

## 2018-09-13 DIAGNOSIS — Z23 Encounter for immunization: Secondary | ICD-10-CM | POA: Diagnosis not present

## 2018-09-13 NOTE — Addendum Note (Signed)
Addended by: Marin Olp on: 09/13/2018 09:59 AM   Modules accepted: Orders

## 2018-09-13 NOTE — Progress Notes (Signed)
   BP 135/83   Pulse 67   Temp (!) 97.5 F (36.4 C) (Oral)    Subjective:   Patient ID: Megan [name], female    DOB: [birth year], 73 y.o.   MRN: [id number]  HPI: Megan [name] is a 73 y.o. female presenting on 09/13/2018 for Laceration (left leg- patient states she cut it on a stack of books this morning )   HPI Left lower leg laceration Patient cut her leg on a stack of books this morning at her house.  She says it bled a lot initially because she is on Coumadin but she has been doing less bleeding since then.  She applied compression on it and it has been doing better.  She says it was somewhat painful but has not had too much pain with it.  She denies any lightheadedness.  Dizziness.  She denies any erythema or infection.  Relevant past medical, surgical, family and social history reviewed and updated as indicated. Interim medical history since our last visit reviewed. Allergies and medications reviewed and updated.  Review of Systems  Constitutional: Negative for fatigue.  Skin: Positive for wound. Negative for color change.  Neurological: Negative for dizziness and light-headedness.    Per HPI unless specifically indicated above      Objective:   BP 135/83   Pulse 67   Temp (!) 97.5 F (36.4 C) (Oral)   Wt Readings from Last 3 Encounters:  06/25/18 300 lb (136.1 kg)  05/01/18 284 lb 9.6 oz (129.1 kg)  11/24/17 283 lb (128.4 kg)    Physical Exam Vitals signs and nursing note reviewed.  Musculoskeletal:        General: Swelling present.  Skin:    General: Skin is warm and dry.     Findings: Laceration (3.5 cm laceration parallel to the floor on the lateral aspect of her left leg, goes into fatty layers but no deeper.) present.     Laceration repair: Wound was irrigated with normal saline at pressure. 2% lidocaine with epinephrine was used for local anesthesia, 68mL. 3-0 Monocryl was used to repair the wound.  Placed 3sutures. Wound was approximated well and  topical antibiotic was used and then it was covered by 4 x 4 and tape told in place. Procedure was tolerated well   Assessment & Plan:   Problem List Items Addressed This Visit    None    Visit Diagnoses    Laceration of left lower leg, initial encounter    -  Primary      Recommended to keep covered for the first 24 hours then use a triple antibiotic on it twice a day and monitor from there, return in 8 to 9 days for a suture removal  Follow up plan: Return in about 9 days (around 09/22/2018), or if symptoms worsen or fail to improve, for Suture removal.  Counseling provided for all of the vaccine components No orders of the defined types were placed in this encounter.   Caryl Pina, MD [facility] 09/13/2018, 8:58 AM

## 2018-09-19 ENCOUNTER — Encounter: Payer: Self-pay | Admitting: Physician Assistant

## 2018-09-19 ENCOUNTER — Ambulatory Visit (INDEPENDENT_AMBULATORY_CARE_PROVIDER_SITE_OTHER): Payer: Medicare Other | Admitting: Physician Assistant

## 2018-09-19 ENCOUNTER — Other Ambulatory Visit: Payer: Self-pay

## 2018-09-19 DIAGNOSIS — I4891 Unspecified atrial fibrillation: Secondary | ICD-10-CM | POA: Diagnosis not present

## 2018-09-19 DIAGNOSIS — R739 Hyperglycemia, unspecified: Secondary | ICD-10-CM

## 2018-09-19 DIAGNOSIS — I10 Essential (primary) hypertension: Secondary | ICD-10-CM | POA: Diagnosis not present

## 2018-09-19 NOTE — Progress Notes (Signed)
Telephone visit  Subjective: XB:[id number] chronic medical conditions PCP: Terald Sleeper, PA-C PQZ:[id number] Megan [name] is a 73 y.o. female calls for telephone consult today. Patient provides verbal consent for consult held via phone.  Patient is identified with 2 separate identifiers.  At this time the entire area is on COVID-19 social distancing and stay home orders are in place.  Patient is of higher risk and therefore we are performing this by a virtual method.  Location of patient: home Location of provider: HOME Others present for call: no  This patient is more a chronic follow-up on her medical conditions.  She states that overall she is doing well.  She is still having her pro times done through her cardiology office.  They are coming out to the car to do it.  She states that she is not having any new issues at this time.  We have looked over her meds and she seems to be up-to-date on refills for these.  She does need to have labs performed in a future order will be placed for her to come in the summer at her convenience to have them performed.  She did recently have some stitches placed and they will be taken out this week by someone in our office.   ROS: Per HPI  No Known Allergies Past Medical History:  Diagnosis Date  . Acute diastolic heart failure ([place])   . Anxiety state, unspecified   . Congestive heart failure, unspecified   . Degenerative joint disease   . GERD (gastroesophageal reflux disease)   . Hyperlipidemia   . Hypertension   . Lymphedema   . Obesity   . Persistent atrial fibrillation     post-termination pauses with afib s/p PPM  . Presence of permanent cardiac pacemaker   . Sinoatrial node dysfunction (HCC)    s/p PPM  . Sleep apnea    compliant with CPAP  . Unspecified hypothyroidism   . Unspecified venous (peripheral) insufficiency     Current Outpatient Medications:  .  albuterol (PROVENTIL HFA;VENTOLIN HFA) 108 (90 Base) MCG/ACT inhaler, Inhale 2  puffs into the lungs every 6 (six) hours as needed for wheezing or shortness of breath., Disp: 3 Inhaler, Rfl: 3 .  atorvastatin (LIPITOR) 80 MG tablet, Take 1 tablet (80 mg total) by mouth daily., Disp: 90 tablet, Rfl: 3 .  clotrimazole-betamethasone (LOTRISONE) cream, Apply 1 application topically daily as needed (for affected area(S))., Disp: , Rfl:  .  diltiazem (CARDIZEM CD) 240 MG 24 hr capsule, Take 1 capsule (240 mg total) by mouth daily., Disp: 4 capsule, Rfl: 0 .  levothyroxine (SYNTHROID, LEVOTHROID) 150 MCG tablet, Take 1 tablet (150 mcg total) by mouth every morning. (Needs to be seen before next refill), Disp: 90 tablet, Rfl: 3 .  metoprolol tartrate (LOPRESSOR) 100 MG tablet, Take 1/2 (one-half) tablet by mouth twice daily, Disp: 90 tablet, Rfl: 1 .  Vitamin D, Ergocalciferol, (DRISDOL) 1.25 MG (50000 UT) CAPS capsule, Take 1 capsule (50,000 Units total) by mouth every 7 (seven) days. Takes on Thursdays., Disp: 12 capsule, Rfl: 3 .  warfarin (COUMADIN) 3 MG tablet, TAKE 1 TABLET BY MOUTH ONCE DAILY, Disp: 40 tablet, Rfl: 6  Assessment/ Plan: 73 y.o. female   1. HYPERTENSION, BENIGN - CBC with Differential/Platelet; Future - CMP14+EGFR; Future - Lipid panel; Future - TSH; Future - Bayer DCA Hb A1c Waived; Future  2. Atrial fibrillation, unspecified type ([place]) - CMP14+EGFR; Future  3. Hyperglycemia - CMP14+EGFR; Future - Lipid  panel; Future - Bayer DCA Hb A1c Waived; Future   Continue all other maintenance medications as listed above.  Start time: 8:04 AM End time: 8:13 AM  No orders of the defined types were placed in this encounter.   Particia Nearing PA-C [place] [phone]

## 2018-09-20 ENCOUNTER — Other Ambulatory Visit: Payer: Self-pay

## 2018-09-20 ENCOUNTER — Ambulatory Visit (INDEPENDENT_AMBULATORY_CARE_PROVIDER_SITE_OTHER): Payer: Medicare Other | Admitting: *Deleted

## 2018-09-20 ENCOUNTER — Telehealth: Payer: Self-pay | Admitting: Physician Assistant

## 2018-09-20 DIAGNOSIS — I4891 Unspecified atrial fibrillation: Secondary | ICD-10-CM

## 2018-09-20 DIAGNOSIS — Z5181 Encounter for therapeutic drug level monitoring: Secondary | ICD-10-CM | POA: Diagnosis not present

## 2018-09-20 LAB — POCT INR: INR: 2.4 (ref 2.0–3.0)

## 2018-09-20 NOTE — Patient Instructions (Signed)
Continue coumadin 1 tablet daily Recheck in 3 weeks Continue greens

## 2018-09-21 ENCOUNTER — Ambulatory Visit (INDEPENDENT_AMBULATORY_CARE_PROVIDER_SITE_OTHER): Payer: Medicare Other | Admitting: Family Medicine

## 2018-09-21 ENCOUNTER — Encounter: Payer: Self-pay | Admitting: Family Medicine

## 2018-09-21 VITALS — BP 168/80 | HR 83 | Temp 97.0°F | Ht 61.0 in | Wt 289.0 lb

## 2018-09-21 DIAGNOSIS — S81812D Laceration without foreign body, left lower leg, subsequent encounter: Secondary | ICD-10-CM

## 2018-09-21 NOTE — Progress Notes (Signed)
   BP (!) 168/80   Pulse 83   Temp (!) 97 F (36.1 C) (Oral)   Ht 5\' 1"  (1.549 m)   Wt 289 lb (131.1 kg)   BMI 54.61 kg/m    Subjective:   Patient ID: Megan [name], female    DOB: Jul 18, 1945, 73 y.o.   MRN: [id number]  HPI: Megan [name] is a 73 y.o. female presenting on 09/21/2018 for Suture / Staple Removal (left lower leg)   HPI Patient is coming in today for suture removal on left lower leg.  We placed 3 sutures about 8 days ago.  There were no signs of infection.  There is no erythema or drainage  Relevant past medical, surgical, family and social history reviewed and updated as indicated. Interim medical history since our last visit reviewed. Allergies and medications reviewed and updated.  Review of Systems  Constitutional: Negative for chills and fever.  Eyes: Negative for visual disturbance.  Respiratory: Negative for chest tightness and shortness of breath.   Cardiovascular: Negative for chest pain and leg swelling.  Skin: Positive for wound. Negative for rash.  Psychiatric/Behavioral: Negative for agitation and behavioral problems.  All other systems reviewed and are negative.   Per HPI unless specifically indicated above   Objective:   BP (!) 168/80   Pulse 83   Temp (!) 97 F (36.1 C) (Oral)   Ht 5\' 1"  (1.549 m)   Wt 289 lb (131.1 kg)   BMI 54.61 kg/m   Wt Readings from Last 3 Encounters:  09/21/18 289 lb (131.1 kg)  06/25/18 300 lb (136.1 kg)  05/01/18 284 lb 9.6 oz (129.1 kg)    Physical Exam Vitals signs and nursing note reviewed.  Constitutional:      General: She is not in acute distress.    Appearance: She is well-developed. She is not diaphoretic.  Eyes:     Conjunctiva/sclera: Conjunctivae normal.  Skin:    Findings: Laceration (well healing and no signs of infection) present. No rash.  Neurological:     Mental Status: She is alert and oriented to person, place, and time.     Coordination: Coordination normal.  Psychiatric:         Behavior: Behavior normal.       Assessment & Plan:   Problem List Items Addressed This Visit    None    Visit Diagnoses    Laceration of left lower leg, subsequent encounter    -  Primary      Removed 3 sutures, looks to be healing well, mild erythema consistent with healing, slight opening in the center when remove the suture, placed Steri-Strips with skin glue.  Leave Steri-Strips on until they fall off, monitor for any signs of infection. Follow up plan: Return if symptoms worsen or fail to improve.  Counseling provided for all of the vaccine components No orders of the defined types were placed in this encounter.   Caryl Pina, MD [facility] 09/21/2018, 9:18 AM

## 2018-09-22 ENCOUNTER — Ambulatory Visit: Payer: Medicare Other | Admitting: Physician Assistant

## 2018-10-11 ENCOUNTER — Ambulatory Visit (INDEPENDENT_AMBULATORY_CARE_PROVIDER_SITE_OTHER): Payer: Medicare Other | Admitting: *Deleted

## 2018-10-11 DIAGNOSIS — Z5181 Encounter for therapeutic drug level monitoring: Secondary | ICD-10-CM

## 2018-10-11 DIAGNOSIS — I4891 Unspecified atrial fibrillation: Secondary | ICD-10-CM | POA: Diagnosis not present

## 2018-10-11 LAB — POCT INR: INR: 2.7 (ref 2.0–3.0)

## 2018-10-11 NOTE — Patient Instructions (Signed)
Continue coumadin 1 tablet daily Get back on your greens Recheck in 3 weeks

## 2018-10-13 ENCOUNTER — Encounter: Payer: Self-pay | Admitting: Physician Assistant

## 2018-10-13 ENCOUNTER — Other Ambulatory Visit: Payer: Self-pay

## 2018-10-13 ENCOUNTER — Ambulatory Visit (INDEPENDENT_AMBULATORY_CARE_PROVIDER_SITE_OTHER): Payer: Medicare Other | Admitting: Physician Assistant

## 2018-10-13 DIAGNOSIS — I890 Lymphedema, not elsewhere classified: Secondary | ICD-10-CM

## 2018-10-13 MED ORDER — HYDROCHLOROTHIAZIDE 25 MG PO TABS: 25.0000 mg | ORAL_TABLET | Freq: Every day | ORAL | 0 refills | Status: DC

## 2018-10-13 NOTE — Progress Notes (Signed)
Telephone visit  Subjective: OF:BPZWCHEN PCP: Terald Sleeper, PA-C IDP:[id number] FRANZISKA PODGURSKI is a 73 y.o. female calls for telephone consult today. Patient provides verbal consent for consult held via phone.  Patient is identified with 2 separate identifiers.  At this time the entire area is on COVID-19 social distancing and stay home orders are in place.  Patient is of higher risk and therefore we are performing this by a virtual method.  Location of patient: home Location of provider: [facility] Others present for call: no  Swelling in ankles in the past week or so. Her recliner broke and she has not been able to get her feet up. She had diagnosis of lymphedema in the past. Her wraps have also worn out and she has not been able to wear them. She is going to try and get them again from the pharmacy. She found her bottle of HCTZ 25 mg one daily. She is going to take it for a while.  Denies fever or chills or skin breaks.   ROS: Per HPI  No Known Allergies Past Medical History:  Diagnosis Date  . Acute diastolic heart failure ([place])   . Anxiety state, unspecified   . Congestive heart failure, unspecified   . Degenerative joint disease   . GERD (gastroesophageal reflux disease)   . Hyperlipidemia   . Hypertension   . Lymphedema   . Obesity   . Persistent atrial fibrillation     post-termination pauses with afib s/p PPM  . Presence of permanent cardiac pacemaker   . Sinoatrial node dysfunction ([facility])    s/p PPM  . Sleep apnea    compliant with CPAP  . Unspecified hypothyroidism   . Unspecified venous (peripheral) insufficiency     Current Outpatient Medications:  .  albuterol (PROVENTIL HFA;VENTOLIN HFA) 108 (90 Base) MCG/ACT inhaler, Inhale 2 puffs into the lungs every 6 (six) hours as needed for wheezing or shortness of breath., Disp: 3 Inhaler, Rfl: 3 .  atorvastatin (LIPITOR) 80 MG tablet, Take 1 tablet (80 mg total) by mouth daily., Disp: 90 tablet, Rfl: 3 .   clotrimazole-betamethasone (LOTRISONE) cream, Apply 1 application topically daily as needed (for affected area(S))., Disp: , Rfl:  .  diltiazem (CARDIZEM CD) 240 MG 24 hr capsule, Take 1 capsule (240 mg total) by mouth daily., Disp: 4 capsule, Rfl: 0 .  hydrochlorothiazide (HYDRODIURIL) 25 MG tablet, Take 1 tablet (25 mg total) by mouth daily., Disp: 30 tablet, Rfl: 0 .  levothyroxine (SYNTHROID, LEVOTHROID) 150 MCG tablet, Take 1 tablet (150 mcg total) by mouth every morning. (Needs to be seen before next refill), Disp: 90 tablet, Rfl: 3 .  metoprolol tartrate (LOPRESSOR) 100 MG tablet, Take 1/2 (one-half) tablet by mouth twice daily, Disp: 90 tablet, Rfl: 1 .  Vitamin D, Ergocalciferol, (DRISDOL) 1.25 MG (50000 UT) CAPS capsule, Take 1 capsule (50,000 Units total) by mouth every 7 (seven) days. Takes on Thursdays., Disp: 12 capsule, Rfl: 3 .  warfarin (COUMADIN) 3 MG tablet, TAKE 1 TABLET BY MOUTH ONCE DAILY, Disp: 40 tablet, Rfl: 6  Assessment/ Plan: 73 y.o. female   1. Lymphedema - hydrochlorothiazide (HYDRODIURIL) 25 MG tablet; Take 1 tablet (25 mg total) by mouth daily.  Dispense: 30 tablet; Refill: 0 - Ambulatory referral to Physical Therapy   No follow-ups on file.  Continue all other maintenance medications as listed above.  Start time: 9:32 AM End time: 9:40 AM  Meds ordered this encounter  Medications  . hydrochlorothiazide (HYDRODIURIL)  25 MG tablet    Sig: Take 1 tablet (25 mg total) by mouth daily.    Dispense:  30 tablet    Refill:  0    Order Specific Question:   Supervising Provider    Answer:   Janora Norlander [[id number]]    Particia Nearing PA-C [facility] [phone]

## 2018-10-18 ENCOUNTER — Ambulatory Visit (INDEPENDENT_AMBULATORY_CARE_PROVIDER_SITE_OTHER): Payer: Medicare Other

## 2018-10-18 ENCOUNTER — Other Ambulatory Visit: Payer: Self-pay

## 2018-10-18 DIAGNOSIS — Z0000 Encounter for general adult medical examination without abnormal findings: Secondary | ICD-10-CM

## 2018-10-18 NOTE — Progress Notes (Signed)
MEDICARE ANNUAL WELLNESS VISIT  10/18/2018  Telephone Visit Disclaimer This Medicare AWV was conducted by telephone due to national recommendations for restrictions regarding the COVID-19 Pandemic (e.g. social distancing).  I verified, using two identifiers, that I am speaking with Megan [name] or their authorized healthcare agent. I discussed the limitations, risks, security, and privacy concerns of performing an evaluation and management service by telephone and the potential availability of an in-person appointment in the future. The patient expressed understanding and agreed to proceed.   Subjective:  Megan [name] is a 73 y.o. female patient of Terald Sleeper, PA-C who had a Medicare Annual Wellness Visit today via telephone. Stephani is Retired and lives with their son. she has 1 children. she reports that she is socially active and does interact with friends/family regularly. she is minimally physically active and enjoys sewing.  Patient Care Team: Theodoro Clock as PCP - General (Physician Assistant) Thompson Grayer, MD as PCP - Electrophysiology (Cardiology)  Advanced Directives 10/18/2018 06/25/2018 07/05/2017 03/14/2017 12/04/2015 12/02/2015 11/25/2015  Does Patient Have a Medical Advance Directive? No No No No No No No  Would patient like information on creating a medical advance directive? No - Patient declined No - Patient declined No - Patient declined No - Patient declined No - patient declined information No - patient declined information No - patient declined information    Hospital Utilization Over the Past 12 Months: # of hospitalizations or ER visits: 1 # of surgeries: 0  Review of Systems    Patient reports that her overall health is unchanged compared to last year.  Patient Reported Readings (BP, Pulse, CBG, Weight, etc) none  Review of Systems: History obtained from chart review  All other systems negative.  Pain Assessment Pain : No/denies pain    Current Medications & Allergies (verified) Allergies as of 10/18/2018   No Known Allergies     Medication List       Accurate as of October 18, 2018 10:05 AM. If you have any questions, ask your nurse or doctor.        albuterol 108 (90 Base) MCG/ACT inhaler Commonly known as: VENTOLIN HFA Inhale 2 puffs into the lungs every 6 (six) hours as needed for wheezing or shortness of breath.   atorvastatin 80 MG tablet Commonly known as: LIPITOR Take 1 tablet (80 mg total) by mouth daily.   clotrimazole-betamethasone cream Commonly known as: LOTRISONE Apply 1 application topically daily as needed (for affected area(S)).   diltiazem 240 MG 24 hr capsule Commonly known as: CARDIZEM CD Take 1 capsule (240 mg total) by mouth daily.   hydrochlorothiazide 25 MG tablet Commonly known as: HYDRODIURIL Take 1 tablet (25 mg total) by mouth daily.   levothyroxine 150 MCG tablet Commonly known as: SYNTHROID Take 1 tablet (150 mcg total) by mouth every morning. (Needs to be seen before next refill)   metoprolol tartrate 100 MG tablet Commonly known as: LOPRESSOR Take 1/2 (one-half) tablet by mouth twice daily   Vitamin D (Ergocalciferol) 1.25 MG (50000 UT) Caps capsule Commonly known as: DRISDOL Take 1 capsule (50,000 Units total) by mouth every 7 (seven) days. Takes on Thursdays.   warfarin 3 MG tablet Commonly known as: COUMADIN Take as directed by the anticoagulation clinic. If you are unsure how to take this medication, talk to your nurse or doctor. Original instructions: TAKE 1 TABLET BY MOUTH ONCE DAILY       History (reviewed): Past Medical History:  Diagnosis Date  . Acute diastolic heart failure ([place])   . Anxiety state, unspecified   . Congestive heart failure, unspecified   . Degenerative joint disease   . GERD (gastroesophageal reflux disease)   . Hyperlipidemia   . Hypertension   . Lymphedema   . Obesity   . Persistent atrial fibrillation     post-termination  pauses with afib s/p PPM  . Presence of permanent cardiac pacemaker   . Sinoatrial node dysfunction ([facility])    s/p PPM  . Sleep apnea    compliant with CPAP  . Unspecified hypothyroidism   . Unspecified venous (peripheral) insufficiency    Past Surgical History:  Procedure Laterality Date  . CATARACT EXTRACTION W/PHACO Left 09/09/2014   Procedure: CATARACT EXTRACTION PHACO AND INTRAOCULAR LENS PLACEMENT (IOC);  Surgeon: Tonny Branch, MD;  Location: [facility];  Service: Ophthalmology;  Laterality: Left;  CDE: 6.99  . CATARACT EXTRACTION W/PHACO Right 10/07/2014   Procedure: CATARACT EXTRACTION PHACO AND INTRAOCULAR LENS PLACEMENT RIGHT EYE;  Surgeon: Tonny Branch, MD;  Location: [facility];  Service: Ophthalmology;  Laterality: Right;  CDE:5.16  . PACEMAKER INSERTION     SJM by JA   Family History  Problem Relation Age of Onset  . Parkinson's disease Other   . CVA Other   . Parkinson's disease Father    Social History   Socioeconomic History  . Marital status: Widowed    Spouse name: Not on file  . Number of children: 1  . Years of education: Not on file  . Highest education level: High school graduate  Occupational History  . Occupation: RETIRED    Employer: RETIRED  Social Needs  . Financial resource strain: Not hard at all  . Food insecurity    Worry: Never true    Inability: Never true  . Transportation needs    Medical: No    Non-medical: No  Tobacco Use  . Smoking status: Former Smoker    Packs/day: 0.50    Years: 10.00    Pack years: 5.00    Types: Cigarettes    Quit date: 03/29/1978    Years since quitting: 40.5  . Smokeless tobacco: Never Used  Substance and Sexual Activity  . Alcohol use: No    Alcohol/week: 0.0 standard drinks  . Drug use: No  . Sexual activity: Not Currently    Birth control/protection: None  Lifestyle  . Physical activity    Days per week: 0 days    Minutes per session: 0 min  . Stress: Not at all  Relationships  . Social connections     Talks on phone: More than three times a week    Gets together: More than three times a week    Attends religious service: Not on file    Active member of club or organization: Not on file    Attends meetings of clubs or organizations: Not on file    Relationship status: Widowed  Other Topics Concern  . Not on file  Social History Narrative  . Not on file    Activities of Daily Living In your present state of health, do you have any difficulty performing the following activities: 10/18/2018  Hearing? N  Vision? Y  Comment Wears reading glasses  Difficulty concentrating or making decisions? N  Walking or climbing stairs? Y  Dressing or bathing? N  Doing errands, shopping? Y  Preparing Food and eating ? N  Using the Toilet? N  In the past six months, have you  accidently leaked urine? N  Do you have problems with loss of bowel control? N  Managing your Medications? N  Managing your Finances? N  Housekeeping or managing your Housekeeping? Y  Some recent data might be hidden    Patient Education/ Literacy How often do you need to have someone help you when you read instructions, pamphlets, or other written materials from your doctor or pharmacy?: 1 - Never What is the last grade level you completed in school?: 12th grade  Exercise Current Exercise Habits: The patient does not participate in regular exercise at present, Exercise limited by: orthopedic condition(s)  Diet Patient reports consuming 3 meals a day and 2 snack(s) a day Patient reports that her primary diet is: Regular Patient reports that she does have regular access to food.   Depression Screen PHQ 2/9 Scores 05/01/2018 11/24/2017 07/26/2017 05/11/2017 03/11/2017 02/21/2017 01/26/2017  PHQ - 2 Score 0 0 0 0 0 1 0     Fall Risk Fall Risk  05/01/2018 11/24/2017 07/26/2017 05/11/2017 03/11/2017  Falls in the past year? 0 No Yes Yes Yes  Number falls in past yr: - - 2 or more 2 or more 2 or more  Injury with Fall? - - Yes  Yes -  Risk Factor Category  - - High Fall Risk High Fall Risk -     Objective:  Megan [name] seemed alert and oriented and she participated appropriately during our telephone visit.  Blood Pressure Weight BMI  BP Readings from Last 3 Encounters:  09/21/18 (!) 168/80  09/13/18 135/83  06/25/18 (!) 156/54   Wt Readings from Last 3 Encounters:  09/21/18 289 lb (131.1 kg)  06/25/18 300 lb (136.1 kg)  05/01/18 284 lb 9.6 oz (129.1 kg)   BMI Readings from Last 1 Encounters:  09/21/18 54.61 kg/m    *Unable to obtain current vital signs, weight, and BMI due to telephone visit type  Hearing/Vision  . Yesmin did not seem to have difficulty with hearing/understanding during the telephone conversation . Reports that she has had a formal eye exam by an eye care professional within the past year . Reports that she has not had a formal hearing evaluation within the past year *Unable to fully assess hearing and vision during telephone visit type  Cognitive Function: 6CIT Screen 10/18/2018  What Year? 0 points  What month? 0 points  What time? 0 points  Count back from 20 0 points  Months in reverse 0 points  Repeat phrase 0 points  Total Score 0   (Normal:0-7, Significant for Dysfunction: >8)  Normal Cognitive Function Screening: Yes   Immunization & Health Maintenance Record Immunization History  Administered Date(s) Administered  . Influenza, High Dose Seasonal PF 01/26/2017, 01/12/2018  . Influenza,inj,Quad PF,6+ Mos 01/01/2016  . Pneumococcal Conjugate-13 12/22/2012, 01/12/2018  . Pneumococcal Polysaccharide-23 12/06/2007  . Td 09/13/2018    Health Maintenance  Topic Date Due  . Hepatitis C Screening  03-10-1946  . FOOT EXAM  04/17/1954  . OPHTHALMOLOGY EXAM  04/17/1954  . URINE MICROALBUMIN  04/17/1954  . COLONOSCOPY  04/17/1994  . MAMMOGRAM  09/15/2016  . HEMOGLOBIN A1C  04/20/2018  . INFLUENZA VACCINE  10/28/2018  . PNA vac Low Risk Adult (2 of 2 - PPSV23)  01/13/2019  . TETANUS/TDAP  09/12/2028  . DEXA SCAN  Completed       Assessment  This is a routine wellness examination for Megan [name].  Health Maintenance: Due or Overdue Health Maintenance Due  Topic  Date Due  . Hepatitis C Screening  [birth year]  . FOOT EXAM  04/17/1954  . OPHTHALMOLOGY EXAM  04/17/1954  . URINE MICROALBUMIN  04/17/1954  . COLONOSCOPY  04/17/1994  . MAMMOGRAM  09/15/2016  . HEMOGLOBIN A1C  04/20/2018    Megan [name] does not need a referral for Community Assistance: Care Management:   no Social Work:    no Prescription Assistance:  no Nutrition/Diabetes Education:  no   Plan:  Personalized Goals Goals Addressed            This Visit's Progress   . Exercise 150 min/wk Moderate Activity      . Have 3 meals a day        Personalized Health Maintenance & Screening Recommendations  Hep C screening  Lung Cancer Screening Recommended: no (Low Dose CT Chest recommended if Age 69-80 years, 30 pack-year currently smoking OR have quit w/in past 15 years) Hepatitis C Screening recommended: yes HIV Screening recommended: no  Advanced Directives: Written information was not prepared per patient's request.  Referrals & Orders No orders of the defined types were placed in this encounter.   Follow-up Plan . Follow-up with Terald Sleeper, PA-C as planned . Schedule for a Hep C screening and discuss shingles vaccine .    I have personally reviewed and noted the following in the patient's chart:   . Medical and social history . Use of alcohol, tobacco or illicit drugs  . Current medications and supplements . Functional ability and status . Nutritional status . Physical activity . Advanced directives . List of other physicians . Hospitalizations, surgeries, and ER visits in previous 12 months . Vitals . Screenings to include cognitive, depression, and falls . Referrals and appointments  In addition, I have reviewed and discussed with  Megan [name] certain preventive protocols, quality metrics, and best practice recommendations. A written personalized care plan for preventive services as well as general preventive health recommendations is available and can be mailed to the patient at her request.      Rolena Infante LPN  [phone]

## 2018-11-01 ENCOUNTER — Other Ambulatory Visit: Payer: Self-pay

## 2018-11-01 ENCOUNTER — Ambulatory Visit (INDEPENDENT_AMBULATORY_CARE_PROVIDER_SITE_OTHER): Payer: Medicare Other | Admitting: *Deleted

## 2018-11-01 DIAGNOSIS — Z5181 Encounter for therapeutic drug level monitoring: Secondary | ICD-10-CM

## 2018-11-01 DIAGNOSIS — I4891 Unspecified atrial fibrillation: Secondary | ICD-10-CM

## 2018-11-01 LAB — POCT INR: INR: 3 (ref 2.0–3.0)

## 2018-11-01 NOTE — Patient Instructions (Addendum)
Hold coumadin tonight then resume 1 tablet daily Get back on your greens Recheck in 4 weeks

## 2018-11-03 ENCOUNTER — Encounter: Payer: Medicare Other | Admitting: Internal Medicine

## 2018-11-09 ENCOUNTER — Telehealth (HOSPITAL_COMMUNITY): Payer: Self-pay | Admitting: Physician Assistant

## 2018-11-09 NOTE — Telephone Encounter (Signed)
11/09/18  Spoke to patient and she said that her legs weren't swollen as bad right now; she doesn't feel like going out in this heat and will wait until its cooler.  She said that she would call us when she was ready to do treatments.

## 2018-11-13 ENCOUNTER — Telehealth: Payer: Self-pay | Admitting: Pharmacist Clinician (PhC)/ Clinical Pharmacy Specialist

## 2018-11-13 NOTE — Telephone Encounter (Signed)
Patient called office, unsure if she took double dose of warfarin last night. Normally takes 3 mg qd, not sure if she took it twice.  Last INR 3.0 (11/01/2018) with range 2.2-2.5.    Advised patient to take only 1/2 tablet (1.5 mg) today, then resume with normal 3 mg daily.  No need to come in sooner for INR check.  Patient voiced understanding

## 2018-11-17 ENCOUNTER — Ambulatory Visit: Payer: Medicare Other | Admitting: Family

## 2018-11-17 ENCOUNTER — Other Ambulatory Visit: Payer: Self-pay

## 2018-11-28 ENCOUNTER — Ambulatory Visit (INDEPENDENT_AMBULATORY_CARE_PROVIDER_SITE_OTHER): Payer: Medicare Other | Admitting: *Deleted

## 2018-11-28 ENCOUNTER — Other Ambulatory Visit: Payer: Self-pay

## 2018-11-28 DIAGNOSIS — Z5181 Encounter for therapeutic drug level monitoring: Secondary | ICD-10-CM | POA: Diagnosis not present

## 2018-11-28 DIAGNOSIS — I4891 Unspecified atrial fibrillation: Secondary | ICD-10-CM | POA: Diagnosis not present

## 2018-11-28 LAB — POCT INR: INR: 3.2 — AB (ref 2.0–3.0)

## 2018-11-28 NOTE — Patient Instructions (Signed)
Hold coumadin tonight then decrease dose to 1 tablet daily except 1/2 tablet on Thursdays Get back on your greens Recheck in 4 weeks

## 2018-11-29 ENCOUNTER — Telehealth: Payer: Self-pay | Admitting: *Deleted

## 2018-11-29 NOTE — Telephone Encounter (Signed)
Patient is unsure how to take her blood thinner

## 2018-11-29 NOTE — Telephone Encounter (Signed)
Returned call to pt, clarified dosage pt was given at [place] with Edrick Oh yesterday.  Advised per the anticoagulation note pt was to skip yesterday's dosage of Coumadin (which she verified she did), and then to start taking 1 tablet daily except 1/2 tablet on Thursdays.  Pt thanked me for that info and verbalized understanding.

## 2018-12-11 ENCOUNTER — Ambulatory Visit (INDEPENDENT_AMBULATORY_CARE_PROVIDER_SITE_OTHER): Payer: Medicare Other | Admitting: Nurse Practitioner

## 2018-12-11 ENCOUNTER — Other Ambulatory Visit: Payer: Self-pay | Admitting: Physician Assistant

## 2018-12-11 ENCOUNTER — Telehealth: Payer: Self-pay | Admitting: *Deleted

## 2018-12-11 ENCOUNTER — Encounter: Payer: Self-pay | Admitting: Nurse Practitioner

## 2018-12-11 DIAGNOSIS — L719 Rosacea, unspecified: Secondary | ICD-10-CM | POA: Diagnosis not present

## 2018-12-11 MED ORDER — NYSTATIN 100000 UNIT/GM EX CREA: 1.0000 "application " | TOPICAL_CREAM | Freq: Two times a day (BID) | CUTANEOUS | 11 refills | Status: DC

## 2018-12-11 MED ORDER — METRONIDAZOLE 1 % EX GEL: Freq: Every day | CUTANEOUS | 0 refills | Status: DC

## 2018-12-11 MED ORDER — DOXYCYCLINE HYCLATE 100 MG PO TABS: 100.0000 mg | ORAL_TABLET | Freq: Two times a day (BID) | ORAL | 0 refills | Status: DC

## 2018-12-11 NOTE — Telephone Encounter (Signed)
Fax from [facility] request for Nystatin cream Not on current med list, last OV 12/10/18 Please advise

## 2018-12-11 NOTE — Telephone Encounter (Signed)
sent 

## 2018-12-11 NOTE — Progress Notes (Signed)
   Virtual Visit via telephone Note Due to COVID-19 pandemic this visit was conducted virtually. This visit type was conducted due to national recommendations for restrictions regarding the COVID-19 Pandemic (e.g. social distancing, sheltering in place) in an effort to limit this patient's exposure and mitigate transmission in our community. All issues noted in this document were discussed and addressed.  A physical exam was not performed with this format. ; I connected with Megan [name] on 12/11/18 at 8:20 by telephone and verified that I am speaking with the correct person using two identifiers. Megan [name] is currently located at home and no one is currently with him during visit. The provider, [name] Done, FNP is located in their office at time of visit.  I discussed the limitations, risks, security and privacy concerns of performing an evaluation and management service by telephone and the availability of in person appointments. I also discussed with the patient that there may be a patient responsible charge related to this service. The patient expressed understanding and agreed to proceed.   History and Present Illness:  Patient calls in for an appointment c/o face breaking out. She says that she has red pustules around her mouth and her nose. Started when she started wearing a mack. She has a history of rosecea and she currently has nothing to take.   Review of Systems  Constitutional: Negative for diaphoresis and weight loss.  Eyes: Negative for blurred vision, double vision and pain.  Respiratory: Negative for shortness of breath.   Cardiovascular: Negative for chest pain, palpitations, orthopnea and leg swelling.  Gastrointestinal: Negative for abdominal pain.  Skin: Positive for rash (on face).  Neurological: Negative for dizziness, sensory change, loss of consciousness, weakness and headaches.  Endo/Heme/Allergies: Negative for polydipsia. Does not bruise/bleed  easily.  Psychiatric/Behavioral: Negative for memory loss. The patient does not have insomnia.   All other systems reviewed and are negative.    Observations/Objective: Alert and oriented- answers all questions appropriately No distress Erythematous pustules around mouth, cheeks and nose- described by patient  Assessment and Plan: Megan [name] in today with chief complaint of Rash   1. Rosacea Keep face clean and dry Avoid picking at pustules - doxycycline (VIBRA-TABS) 100 MG tablet; Take 1 tablet (100 mg total) by mouth 2 (two) times daily. 1 po bid  Dispense: 14 tablet; Refill: 0 - metroNIDAZOLE (METROGEL) 1 % gel; Apply topically daily.  Dispense: 45 g; Refill: 0   Follow Up Instructions: prn    I discussed the assessment and treatment plan with the patient. The patient was provided an opportunity to ask questions and all were answered. The patient agreed with the plan and demonstrated an understanding of the instructions.   The patient was advised to call back or seek an in-person evaluation if the symptoms worsen or if the condition fails to improve as anticipated.  The above assessment and management plan was discussed with the patient. The patient verbalized understanding of and has agreed to the management plan. Patient is aware to call the clinic if symptoms persist or worsen. Patient is aware when to return to the clinic for a follow-up visit. Patient educated on when it is appropriate to go to the emergency department.   Time call ended:  8:30  I provided 10 minutes of non-face-to-face time during this encounter.    [name] Done, FNP

## 2018-12-18 ENCOUNTER — Telehealth: Payer: Self-pay | Admitting: *Deleted

## 2018-12-18 NOTE — Telephone Encounter (Signed)
Called pt.  She has been on doxycycline for rash on face from face mask.  Finishes abx today.  Offered pt appt for in the morning but she doesn't think she can get here.  Told pt to hold coumadin tonight and eat extra greens this week and keep appt for 10/1.  She verbalized understanding and will call back if she changes her mind about appt in the morning.

## 2018-12-18 NOTE — Telephone Encounter (Signed)
Placed on antibiotic for 7 days doxycycline 100 mg one BID. Has one more day of this medication.

## 2018-12-24 ENCOUNTER — Other Ambulatory Visit: Payer: Self-pay | Admitting: Internal Medicine

## 2018-12-25 ENCOUNTER — Other Ambulatory Visit: Payer: Self-pay | Admitting: Physician Assistant

## 2018-12-25 MED ORDER — ALBUTEROL SULFATE HFA 108 (90 BASE) MCG/ACT IN AERS: 2.0000 | INHALATION_SPRAY | Freq: Four times a day (QID) | RESPIRATORY_TRACT | 1 refills | Status: DC | PRN

## 2018-12-25 NOTE — Telephone Encounter (Signed)
Refill sent. Patient aware.  

## 2018-12-26 ENCOUNTER — Other Ambulatory Visit: Payer: Self-pay

## 2018-12-27 ENCOUNTER — Ambulatory Visit: Payer: Medicare Other | Admitting: Family Medicine

## 2018-12-28 ENCOUNTER — Other Ambulatory Visit: Payer: Self-pay

## 2018-12-28 ENCOUNTER — Telehealth: Payer: Self-pay | Admitting: Physician Assistant

## 2018-12-28 ENCOUNTER — Ambulatory Visit (INDEPENDENT_AMBULATORY_CARE_PROVIDER_SITE_OTHER): Payer: Medicare Other | Admitting: *Deleted

## 2018-12-28 DIAGNOSIS — I4891 Unspecified atrial fibrillation: Secondary | ICD-10-CM | POA: Diagnosis not present

## 2018-12-28 DIAGNOSIS — Z23 Encounter for immunization: Secondary | ICD-10-CM | POA: Diagnosis not present

## 2018-12-28 DIAGNOSIS — Z5181 Encounter for therapeutic drug level monitoring: Secondary | ICD-10-CM

## 2018-12-28 LAB — POCT INR: INR: 3.1 — AB (ref 2.0–3.0)

## 2018-12-28 NOTE — Patient Instructions (Signed)
Hold coumadin tonight then decrease dose to 1 tablet daily except 1/2 tablet on Mondays and Thursdays Get back on your greens Recheck in 4 weeks

## 2018-12-29 ENCOUNTER — Ambulatory Visit: Payer: Medicare Other | Admitting: Physician Assistant

## 2018-12-29 ENCOUNTER — Encounter: Payer: Medicare Other | Admitting: Internal Medicine

## 2019-01-01 ENCOUNTER — Telehealth: Payer: Self-pay | Admitting: Pharmacist

## 2019-01-01 NOTE — Telephone Encounter (Signed)
Called pt to discuss changing from warfarin to Hope due to better efficacy and safety data, as well as less frequent monitoring, especially given COVID-19 pandemic.   Pt is very interested in changing to Eliquis, however states she does not have medication insurance. She may qualify for patient assistance through [employer]. Can review application at next appt to see if she meets the income qualifications. Form available at: [email or web address]

## 2019-01-05 ENCOUNTER — Encounter: Payer: Self-pay | Admitting: Family

## 2019-01-05 ENCOUNTER — Ambulatory Visit (INDEPENDENT_AMBULATORY_CARE_PROVIDER_SITE_OTHER): Payer: Medicare Other | Admitting: Family

## 2019-01-05 DIAGNOSIS — B372 Candidiasis of skin and nail: Secondary | ICD-10-CM | POA: Diagnosis not present

## 2019-01-05 DIAGNOSIS — L089 Local infection of the skin and subcutaneous tissue, unspecified: Secondary | ICD-10-CM

## 2019-01-05 MED ORDER — NYSTATIN 100000 UNIT/GM EX POWD: Freq: Four times a day (QID) | CUTANEOUS | 0 refills | Status: DC

## 2019-01-05 MED ORDER — SULFAMETHOXAZOLE-TRIMETHOPRIM 800-160 MG PO TABS: 1.0000 | ORAL_TABLET | Freq: Two times a day (BID) | ORAL | 0 refills | Status: DC

## 2019-01-05 NOTE — Progress Notes (Signed)
   Virtual Visit via telephone Note Due to COVID-19 pandemic this visit was conducted virtually. This visit type was conducted due to national recommendations for restrictions regarding the COVID-19 Pandemic (e.g. social distancing, sheltering in place) in an effort to limit this patient's exposure and mitigate transmission in our community. All issues noted in this document were discussed and addressed.  A physical exam was not performed with this format.  Attempted to call patient at 10:19 Am, line was busy.   I connected with Megan [name] on 01/05/19 at 10:28 AM by telephone and verified that I am speaking with the correct person using two identifiers. Megan [name] is currently located at home and son is currently with her during visit. The provider, Evelina Dun, FNP is located in their office at time of visit.  I discussed the limitations, risks, security and privacy concerns of performing an evaluation and management service by telephone and the availability of in person appointments. I also discussed with the patient that there may be a patient responsible charge related to this service. The patient expressed understanding and agreed to proceed.   History and Present Illness:  HPI PT calls the office today with an abscess on her right breast. She states she has a problem with yeast and has a spot that is "opened". She states she has a spot appear about last year that is similar and was given Bactrim and it went a way.  She reports it is draining a yellow/green discharge, and is tender that is about 5   out 10 when she touches it. Denies any fever or warmth.   She has cleaned it with peroxide with no relief.   Review of Systems  Skin:       wound  All other systems reviewed and are negative.    Observations/Objective: No SOB or distress noted   Assessment and Plan: 1. Skin infection - sulfamethoxazole-trimethoprim (BACTRIM DS) 800-160 MG tablet; Take 1 tablet by mouth  2 (two) times daily.  Dispense: 20 tablet; Refill: 0  2. Skin candidiasis - nystatin (MYCOSTATIN/NYSTOP) powder; Apply topically 4 (four) times daily.  Dispense: 15 g; Refill: 0  Keep clean and dry Report any increase s/s of infection, start bactrim Keep follow up for INR Nystatin powder given to help with yeast of skin    I discussed the assessment and treatment plan with the patient. The patient was provided an opportunity to ask questions and all were answered. The patient agreed with the plan and demonstrated an understanding of the instructions.   The patient was advised to call back or seek an in-person evaluation if the symptoms worsen or if the condition fails to improve as anticipated.  The above assessment and management plan was discussed with the patient. The patient verbalized understanding of and has agreed to the management plan. Patient is aware to call the clinic if symptoms persist or worsen. Patient is aware when to return to the clinic for a follow-up visit. Patient educated on when it is appropriate to go to the emergency department.   Time call ended:  10:40 AM  I provided 12 minutes of non-face-to-face time during this encounter.    Evelina Dun, FNP '

## 2019-01-15 ENCOUNTER — Other Ambulatory Visit: Payer: Self-pay

## 2019-01-16 ENCOUNTER — Ambulatory Visit (INDEPENDENT_AMBULATORY_CARE_PROVIDER_SITE_OTHER): Payer: Medicare Other | Admitting: Physician Assistant

## 2019-01-16 ENCOUNTER — Encounter: Payer: Self-pay | Admitting: Physician Assistant

## 2019-01-16 VITALS — BP 139/77 | HR 86 | Temp 96.8°F | Ht 61.0 in | Wt 289.4 lb

## 2019-01-16 DIAGNOSIS — I890 Lymphedema, not elsewhere classified: Secondary | ICD-10-CM | POA: Diagnosis not present

## 2019-01-16 DIAGNOSIS — R739 Hyperglycemia, unspecified: Secondary | ICD-10-CM

## 2019-01-16 DIAGNOSIS — I495 Sick sinus syndrome: Secondary | ICD-10-CM | POA: Diagnosis not present

## 2019-01-16 DIAGNOSIS — I10 Essential (primary) hypertension: Secondary | ICD-10-CM | POA: Diagnosis not present

## 2019-01-16 DIAGNOSIS — I4891 Unspecified atrial fibrillation: Secondary | ICD-10-CM

## 2019-01-16 LAB — BAYER DCA HB A1C WAIVED: HB A1C (BAYER DCA - WAIVED): 6.2 % (ref <7.0)

## 2019-01-16 NOTE — Patient Instructions (Signed)
Carbohydrate Counting for Diabetes Mellitus, Adult  Carbohydrate counting is a method of keeping track of how many carbohydrates you eat. Eating carbohydrates naturally increases the amount of sugar (glucose) in the blood. Counting how many carbohydrates you eat helps keep your blood glucose within normal limits, which helps you manage your diabetes (diabetes mellitus). It is important to know how many carbohydrates you can safely have in each meal. This is different for every person. A diet and nutrition specialist (registered dietitian) can help you make a meal plan and calculate how many carbohydrates you should have at each meal and snack. Carbohydrates are found in the following foods:  Grains, such as breads and cereals.  Dried beans and soy products.  Starchy vegetables, such as potatoes, peas, and corn.  Fruit and fruit juices.  Milk and yogurt.  Sweets and snack foods, such as cake, cookies, candy, chips, and soft drinks. How do I count carbohydrates? There are two ways to count carbohydrates in food. You can use either of the methods or a combination of both. Reading "Nutrition Facts" on packaged food The "Nutrition Facts" list is included on the labels of almost all packaged foods and beverages in the [place] It includes:  The serving size.  Information about nutrients in each serving, including the grams (g) of carbohydrate per serving. To use the "Nutrition Facts":  Decide how many servings you will have.  Multiply the number of servings by the number of carbohydrates per serving.  The resulting number is the total amount of carbohydrates that you will be having. Learning standard serving sizes of other foods When you eat carbohydrate foods that are not packaged or do not include "Nutrition Facts" on the label, you need to measure the servings in order to count the amount of carbohydrates:  Measure the foods that you will eat with a food scale or measuring cup, if needed.   Decide how many standard-size servings you will eat.  Multiply the number of servings by 15. Most carbohydrate-rich foods have about 15 g of carbohydrates per serving. ? For example, if you eat 8 oz (170 g) of strawberries, you will have eaten 2 servings and 30 g of carbohydrates (2 servings x 15 g = 30 g).  For foods that have more than one food mixed, such as soups and casseroles, you must count the carbohydrates in each food that is included. The following list contains standard serving sizes of common carbohydrate-rich foods. Each of these servings has about 15 g of carbohydrates:   hamburger bun or  English muffin.   oz (15 mL) syrup.   oz (14 g) jelly.  1 slice of bread.  1 six-inch tortilla.  3 oz (85 g) cooked rice or pasta.  4 oz (113 g) cooked dried beans.  4 oz (113 g) starchy vegetable, such as peas, corn, or potatoes.  4 oz (113 g) hot cereal.  4 oz (113 g) mashed potatoes or  of a large baked potato.  4 oz (113 g) canned or frozen fruit.  4 oz (120 mL) fruit juice.  4-6 crackers.  6 chicken nuggets.  6 oz (170 g) unsweetened dry cereal.  6 oz (170 g) plain fat-free yogurt or yogurt sweetened with artificial sweeteners.  8 oz (240 mL) milk.  8 oz (170 g) fresh fruit or one small piece of fruit.  24 oz (680 g) popped popcorn. Example of carbohydrate counting Sample meal  3 oz (85 g) chicken breast.  6 oz (170 g)   brown rice.  4 oz (113 g) corn.  8 oz (240 mL) milk.  8 oz (170 g) strawberries with sugar-free whipped topping. Carbohydrate calculation 1. Identify the foods that contain carbohydrates: ? Rice. ? Corn. ? Milk. ? Strawberries. 2. Calculate how many servings you have of each food: ? 2 servings rice. ? 1 serving corn. ? 1 serving milk. ? 1 serving strawberries. 3. Multiply each number of servings by 15 g: ? 2 servings rice x 15 g = 30 g. ? 1 serving corn x 15 g = 15 g. ? 1 serving milk x 15 g = 15 g. ? 1 serving  strawberries x 15 g = 15 g. 4. Add together all of the amounts to find the total grams of carbohydrates eaten: ? 30 g + 15 g + 15 g + 15 g = 75 g of carbohydrates total. Summary  Carbohydrate counting is a method of keeping track of how many carbohydrates you eat.  Eating carbohydrates naturally increases the amount of sugar (glucose) in the blood.  Counting how many carbohydrates you eat helps keep your blood glucose within normal limits, which helps you manage your diabetes.  A diet and nutrition specialist (registered dietitian) can help you make a meal plan and calculate how many carbohydrates you should have at each meal and snack. This information is not intended to replace advice given to you by your health care provider. Make sure you discuss any questions you have with your health care provider. Document Released: 03/15/2005 Document Revised: 10/07/2016 Document Reviewed: 08/27/2015 Elsevier Patient Education  2020 Elsevier Inc.  

## 2019-01-17 LAB — CMP14+EGFR
ALT: 21 IU/L (ref 0–32)
AST: 17 IU/L (ref 0–40)
Albumin/Globulin Ratio: 1.4 (ref 1.2–2.2)
Albumin: 4.2 g/dL (ref 3.7–4.7)
Alkaline Phosphatase: 173 IU/L — ABNORMAL HIGH (ref 39–117)
BUN/Creatinine Ratio: 15 (ref 12–28)
BUN: 18 mg/dL (ref 8–27)
Bilirubin Total: 1.6 mg/dL — ABNORMAL HIGH (ref 0.0–1.2)
CO2: 19 mmol/L — ABNORMAL LOW (ref 20–29)
Calcium: 9.7 mg/dL (ref 8.7–10.3)
Chloride: 102 mmol/L (ref 96–106)
Creatinine, Ser: 1.17 mg/dL — ABNORMAL HIGH (ref 0.57–1.00)
GFR calc Af Amer: 53 mL/min/{1.73_m2} — ABNORMAL LOW (ref >59)
GFR calc non Af Amer: 46 mL/min/{1.73_m2} — ABNORMAL LOW (ref >59)
Globulin, Total: 3 g/dL (ref 1.5–4.5)
Glucose: 145 mg/dL — ABNORMAL HIGH (ref 65–99)
Potassium: 4.6 mmol/L (ref 3.5–5.2)
Sodium: 138 mmol/L (ref 134–144)
Total Protein: 7.2 g/dL (ref 6.0–8.5)

## 2019-01-17 LAB — CBC WITH DIFFERENTIAL/PLATELET
Basophils Absolute: 0.1 10*3/uL (ref 0.0–0.2)
Basos: 1 %
EOS (ABSOLUTE): 0.1 10*3/uL (ref 0.0–0.4)
Eos: 1 %
Hematocrit: 45.3 % (ref 34.0–46.6)
Hemoglobin: 15.2 g/dL (ref 11.1–15.9)
Immature Grans (Abs): 0.1 10*3/uL (ref 0.0–0.1)
Immature Granulocytes: 1 %
Lymphocytes Absolute: 1.5 10*3/uL (ref 0.7–3.1)
Lymphs: 15 %
MCH: 30.5 pg (ref 26.6–33.0)
MCHC: 33.6 g/dL (ref 31.5–35.7)
MCV: 91 fL (ref 79–97)
Monocytes Absolute: 0.8 10*3/uL (ref 0.1–0.9)
Monocytes: 8 %
Neutrophils Absolute: 7.4 10*3/uL — ABNORMAL HIGH (ref 1.4–7.0)
Neutrophils: 74 %
Platelets: 301 10*3/uL (ref 150–450)
RBC: 4.98 x10E6/uL (ref 3.77–5.28)
RDW: 13.7 % (ref 11.7–15.4)
WBC: 10 10*3/uL (ref 3.4–10.8)

## 2019-01-17 LAB — LIPID PANEL
Chol/HDL Ratio: 3.9 ratio (ref 0.0–4.4)
Cholesterol, Total: 186 mg/dL (ref 100–199)
HDL: 48 mg/dL (ref >39)
LDL Chol Calc (NIH): 113 mg/dL — ABNORMAL HIGH (ref 0–99)
Triglycerides: 143 mg/dL (ref 0–149)
VLDL Cholesterol Cal: 25 mg/dL (ref 5–40)

## 2019-01-17 LAB — TSH: TSH: 3.1 u[IU]/mL (ref 0.450–4.500)

## 2019-01-19 ENCOUNTER — Telehealth: Payer: Self-pay | Admitting: Physician Assistant

## 2019-01-19 ENCOUNTER — Other Ambulatory Visit: Payer: Self-pay | Admitting: Physician Assistant

## 2019-01-19 DIAGNOSIS — N289 Disorder of kidney and ureter, unspecified: Secondary | ICD-10-CM

## 2019-01-19 DIAGNOSIS — E119 Type 2 diabetes mellitus without complications: Secondary | ICD-10-CM

## 2019-01-19 NOTE — Telephone Encounter (Signed)
Pt aware / copy sent to pt

## 2019-01-21 NOTE — Progress Notes (Signed)
BP 139/77   Pulse 86   Temp (!) 96.8 F (36 C) (Temporal)   Ht _0  (1.549 m)   Wt 289 lb 6.4 oz (131.3 kg)   SpO2 99%   BMI 54.68 kg/m    Subjective:    Patient ID: Megan [name], female    DOB: [birth year], 73 y.o.   MRN: [id number]  HPI: Megan [name] is a 73 y.o. female presenting on 01/16/2019 for Medical Management of Chronic Issues, Hypertension, and Hyperlipidemia  .  Recheck on her chronic medical conditions which do include atrial fibrillation, followed by cardiology, hypertension, tachycardia-bradycardia, hyperlipidemia, controlled diabetes.  Also has chronic lymphedema in her lower legs.  She has not been going to physical therapy at this time.  We can get her set back up for that if the knee.  She has been wearing her compression stockings when she can.  She states that overall she is not having any significant difficulties.  She is followed by cardiology for her pro time.  Past Medical History:  Diagnosis Date  . Acute diastolic heart failure ([place])   . Anxiety state, unspecified   . Congestive heart failure, unspecified   . Degenerative joint disease   . GERD (gastroesophageal reflux disease)   . Hyperlipidemia   . Hypertension   . Lymphedema   . Obesity   . Persistent atrial fibrillation (H[facility])     post-termination pauses with afib s/p PPM  . Presence of permanent cardiac pacemaker   . Sinoatrial node dysfunction (HCC)    s/p PPM  . Sleep apnea    compliant with CPAP  . Unspecified hypothyroidism   . Unspecified venous (peripheral) insufficiency    Relevant past medical, surgical, family and social history reviewed and updated as indicated. Interim medical history since our last visit reviewed. Allergies and medications reviewed and updated. DATA REVIEWED: CHART IN EPIC  Family History reviewed for pertinent findings.  Review of Systems  Constitutional: Positive for fatigue. Negative for activity change and fever.  HENT: Negative.   Eyes:  Negative.   Respiratory: Negative.  Negative for cough.   Cardiovascular: Positive for palpitations and leg swelling. Negative for chest pain.  Gastrointestinal: Negative.  Negative for abdominal pain.  Endocrine: Negative.   Genitourinary: Negative.  Negative for dysuria.  Musculoskeletal: Positive for arthralgias.  Skin: Negative.   Neurological: Negative.     Allergies as of 01/16/2019   No Known Allergies     Medication List       Accurate as of January 16, 2019 11:59 PM. If you have any questions, ask your nurse or doctor.        STOP taking these medications   clotrimazole-betamethasone cream Commonly known as: LOTRISONE Stopped by: Terald Sleeper, PA-C   doxycycline 100 MG tablet Commonly known as: VIBRA-TABS Stopped by: Terald Sleeper, PA-C   sulfamethoxazole-trimethoprim 800-160 MG tablet Commonly known as: Bactrim DS Stopped by: Terald Sleeper, PA-C     TAKE these medications   albuterol 108 (90 Base) MCG/ACT inhaler Commonly known as: VENTOLIN HFA Inhale 2 puffs into the lungs every 6 (six) hours as needed for wheezing or shortness of breath.   atorvastatin 80 MG tablet Commonly known as: LIPITOR Take 1 tablet (80 mg total) by mouth daily.   diltiazem 240 MG 24 hr capsule Commonly known as: CARDIZEM CD Take 1 capsule (240 mg total) by mouth daily.   hydrochlorothiazide 25 MG tablet Commonly known as: HYDRODIURIL Take 1 tablet (25  mg total) by mouth daily.   levothyroxine 150 MCG tablet Commonly known as: SYNTHROID Take 1 tablet (150 mcg total) by mouth every morning. (Needs to be seen before next refill)   metoprolol tartrate 100 MG tablet Commonly known as: LOPRESSOR Take 1/2 (one-half) tablet by mouth twice daily   metroNIDAZOLE 1 % gel Commonly known as: Metrogel Apply topically daily.   nystatin powder Commonly known as: MYCOSTATIN/NYSTOP Apply topically 4 (four) times daily.   Vitamin D (Ergocalciferol) 1.25 MG (50000 UT) Caps capsule  Commonly known as: DRISDOL Take 1 capsule (50,000 Units total) by mouth every 7 (seven) days. Takes on Thursdays.   warfarin 3 MG tablet Commonly known as: COUMADIN Take as directed by the anticoagulation clinic. If you are unsure how to take this medication, talk to your nurse or doctor. Original instructions: TAKE 1 TABLET BY MOUTH ONCE DAILY          Objective:    BP 139/77   Pulse 86   Temp (!) 96.8 F (36 C) (Temporal)   Ht _0  (1.549 m)   Wt 289 lb 6.4 oz (131.3 kg)   SpO2 99%   BMI 54.68 kg/m   No Known Allergies  Wt Readings from Last 3 Encounters:  01/16/19 289 lb 6.4 oz (131.3 kg)  09/21/18 289 lb (131.1 kg)  06/25/18 300 lb (136.1 kg)    Physical Exam Constitutional:      General: She is not in acute distress.    Appearance: Normal appearance. She is well-developed.  HENT:     Head: Normocephalic and atraumatic.  Cardiovascular:     Rate and Rhythm: Normal rate. Rhythm irregular.  Pulmonary:     Effort: Pulmonary effort is normal.     Breath sounds: Normal breath sounds.  Skin:    General: Skin is warm and dry.     Findings: No rash.  Neurological:     Mental Status: She is alert and oriented to person, place, and time.     Deep Tendon Reflexes: Reflexes are normal and symmetric.     Results for orders placed or performed in visit on 01/16/19  Bayer DCA Hb A1c Waived  Result Value Ref Range   HB A1C (BAYER DCA - WAIVED) 6.2 <7.0 %  CMP14+EGFR  Result Value Ref Range   Glucose 145 (H) 65 - 99 mg/dL   BUN 18 8 - 27 mg/dL   Creatinine, Ser 1.17 (H) 0.57 - 1.00 mg/dL   GFR calc non Af Amer 46 (L) >59 mL/min/1.73   GFR calc Af Amer 53 (L) >59 mL/min/1.73   BUN/Creatinine Ratio 15 12 - 28   Sodium 138 134 - 144 mmol/L   Potassium 4.6 3.5 - 5.2 mmol/L   Chloride 102 96 - 106 mmol/L   CO2 19 (L) 20 - 29 mmol/L   Calcium 9.7 8.7 - 10.3 mg/dL   Total Protein 7.2 6.0 - 8.5 g/dL   Albumin 4.2 3.7 - 4.7 g/dL   Globulin, Total 3.0 1.5 - 4.5 g/dL    Albumin/Globulin Ratio 1.4 1.2 - 2.2   Bilirubin Total 1.6 (H) 0.0 - 1.2 mg/dL   Alkaline Phosphatase 173 (H) 39 - 117 IU/L   AST 17 0 - 40 IU/L   ALT 21 0 - 32 IU/L  TSH  Result Value Ref Range   TSH 3.100 0.450 - 4.500 uIU/mL  Lipid panel  Result Value Ref Range   Cholesterol, Total 186 100 - 199 mg/dL   Triglycerides 143  0 - 149 mg/dL   HDL 48 >39 mg/dL   VLDL Cholesterol Cal 25 5 - 40 mg/dL   LDL Chol Calc (NIH) 113 (H) 0 - 99 mg/dL   Chol/HDL Ratio 3.9 0.0 - 4.4 ratio  CBC with Differential/Platelet  Result Value Ref Range   WBC 10.0 3.4 - 10.8 x10E3/uL   RBC 4.98 3.77 - 5.28 x10E6/uL   Hemoglobin 15.2 11.1 - 15.9 g/dL   Hematocrit 45.3 34.0 - 46.6 %   MCV 91 79 - 97 fL   MCH 30.5 26.6 - 33.0 pg   MCHC 33.6 31.5 - 35.7 g/dL   RDW 13.7 11.7 - 15.4 %   Platelets 301 150 - 450 x10E3/uL   Neutrophils 74 Not Estab. %   Lymphs 15 Not Estab. %   Monocytes 8 Not Estab. %   Eos 1 Not Estab. %   Basos 1 Not Estab. %   Neutrophils Absolute 7.4 (H) 1.4 - 7.0 x10E3/uL   Lymphocytes Absolute 1.5 0.7 - 3.1 x10E3/uL   Monocytes Absolute 0.8 0.1 - 0.9 x10E3/uL   EOS (ABSOLUTE) 0.1 0.0 - 0.4 x10E3/uL   Basophils Absolute 0.1 0.0 - 0.2 x10E3/uL   Immature Granulocytes 1 Not Estab. %   Immature Grans (Abs) 0.1 0.0 - 0.1 x10E3/uL      Assessment & Plan:   1. Lymphedema - CMP14+EGFR  2. Tachycardia-bradycardia syndrome ([place]) - CMP14+EGFR  3. HYPERTENSION, BENIGN - Bayer DCA Hb A1c Waived - CMP14+EGFR - TSH - Lipid panel - CBC with Differential/Platelet  4. Atrial fibrillation, unspecified type (HCC) - CMP14+EGFR  5. Hyperglycemia - Bayer DCA Hb A1c Waived - CMP14+EGFR - Lipid panel   Continue all other maintenance medications as listed above.  Follow up plan: Return in about 3 months (around 04/18/2019) for recheck medications.  Educational handout given for [place] PA-C [facility] [address]  [place], [place] [address]  [phone]   01/21/2019, 10:29 PM

## 2019-01-23 ENCOUNTER — Telehealth: Payer: Self-pay | Admitting: Physician Assistant

## 2019-01-24 NOTE — Telephone Encounter (Signed)
Pt labs mailed to home

## 2019-01-25 ENCOUNTER — Ambulatory Visit (INDEPENDENT_AMBULATORY_CARE_PROVIDER_SITE_OTHER): Payer: Medicare Other | Admitting: *Deleted

## 2019-01-25 DIAGNOSIS — I4891 Unspecified atrial fibrillation: Secondary | ICD-10-CM | POA: Diagnosis not present

## 2019-01-25 DIAGNOSIS — Z5181 Encounter for therapeutic drug level monitoring: Secondary | ICD-10-CM

## 2019-01-25 LAB — POCT INR: INR: 1.6 — AB (ref 2.0–3.0)

## 2019-01-25 NOTE — Patient Instructions (Signed)
Take warfarin 1 tablet tonight then resume 1 tablet daily except 1/2 tablet on Mondays and Thursdays Keep greens consistent  Recheck in 3 weeks

## 2019-01-29 ENCOUNTER — Other Ambulatory Visit: Payer: Self-pay | Admitting: Physician Assistant

## 2019-02-13 ENCOUNTER — Ambulatory Visit (INDEPENDENT_AMBULATORY_CARE_PROVIDER_SITE_OTHER): Payer: Medicare Other | Admitting: *Deleted

## 2019-02-13 ENCOUNTER — Other Ambulatory Visit: Payer: Self-pay

## 2019-02-13 DIAGNOSIS — I4891 Unspecified atrial fibrillation: Secondary | ICD-10-CM | POA: Diagnosis not present

## 2019-02-13 DIAGNOSIS — Z5181 Encounter for therapeutic drug level monitoring: Secondary | ICD-10-CM | POA: Diagnosis not present

## 2019-02-13 LAB — POCT INR: INR: 2 (ref 2.0–3.0)

## 2019-02-13 NOTE — Patient Instructions (Signed)
Increase coumadin to 1 tablet daily except 1/2 tablet on Mondays Keep greens consistent  Recheck in 3 weeks

## 2019-02-14 ENCOUNTER — Telehealth: Payer: Self-pay | Admitting: Physician Assistant

## 2019-02-14 NOTE — Chronic Care Management (AMB) (Signed)
Chronic Care Management   Note  02/14/2019 Name: TALICIA SUI MRN: [id number] DOB: [birth year]  JONICE CERRA is a 73 y.o. year old female who is a primary care patient of Terald Sleeper, PA-C. I reached out to [name] by phone today in response to a referral sent by Ms. Cheral Marker Cella's health plan.     Ms. Schnieders was given information about Chronic Care Management services today including:  1. CCM service includes personalized support from designated clinical staff supervised by her physician, including individualized plan of care and coordination with other care providers 2. 24/7 contact phone numbers for assistance for urgent and routine care needs. 3. Service will only be billed when office clinical staff spend 20 minutes or more in a month to coordinate care. 4. Only one practitioner may furnish and bill the service in a calendar month. 5. The patient may stop CCM services at any time (effective at the end of the month) by phone call to the office staff. 6. The patient will be responsible for cost sharing (co-pay) of up to 20% of the service fee (after annual deductible is met).  Patient agreed to services and verbal consent obtained.   Follow up plan: Telephone appointment with CCM team member scheduled for: 02/28/2019  [place], [facility]  [place], [place] [address] Direct Dial: [email or web address]  Website: [email or web address]

## 2019-02-21 ENCOUNTER — Other Ambulatory Visit: Payer: Self-pay

## 2019-02-21 ENCOUNTER — Encounter: Payer: Self-pay | Admitting: Internal Medicine

## 2019-02-21 ENCOUNTER — Ambulatory Visit (INDEPENDENT_AMBULATORY_CARE_PROVIDER_SITE_OTHER): Payer: Medicare Other | Admitting: Internal Medicine

## 2019-02-21 VITALS — BP 104/78 | HR 82 | Ht 61.0 in | Wt 275.0 lb

## 2019-02-21 DIAGNOSIS — I10 Essential (primary) hypertension: Secondary | ICD-10-CM | POA: Diagnosis not present

## 2019-02-21 DIAGNOSIS — I495 Sick sinus syndrome: Secondary | ICD-10-CM | POA: Diagnosis not present

## 2019-02-21 DIAGNOSIS — I4821 Permanent atrial fibrillation: Secondary | ICD-10-CM

## 2019-02-21 LAB — CUP PACEART INCLINIC DEVICE CHECK
Date Time Interrogation Session: 20201125093313
Implantable Lead Implant Date: 20100728
Implantable Lead Implant Date: 20100728
Implantable Lead Location: 753859
Implantable Lead Location: 753860
Implantable Pulse Generator Implant Date: 20100728
Pulse Gen Model: 2210
Pulse Gen Serial Number: 2339600

## 2019-02-21 NOTE — Progress Notes (Signed)
PCP: Terald Sleeper, PA-C Primary Cardiologist: Dr Domenic Polite Primary EP: Dr Rayann Heman  Megan [name] is a 73 y.o. female who presents today for routine electrophysiology followup.  Since last being seen in our clinic, the patient reports doing very well.  Today, she denies symptoms of palpitations, chest pain, shortness of breath,  lower extremity edema, dizziness, presyncope, or syncope.  The patient is otherwise without complaint today.   Past Medical History:  Diagnosis Date  . Acute diastolic heart failure ([facility])   . Anxiety state, unspecified   . Congestive heart failure, unspecified   . Degenerative joint disease   . GERD (gastroesophageal reflux disease)   . Hyperlipidemia   . Hypertension   . Lymphedema   . Obesity   . Persistent atrial fibrillation ([facility])     post-termination pauses with afib s/p PPM  . Presence of permanent cardiac pacemaker   . Sinoatrial node dysfunction (HCC)    s/p PPM  . Sleep apnea    compliant with CPAP  . Unspecified hypothyroidism   . Unspecified venous (peripheral) insufficiency    Past Surgical History:  Procedure Laterality Date  . CATARACT EXTRACTION W/PHACO Left 09/09/2014   Procedure: CATARACT EXTRACTION PHACO AND INTRAOCULAR LENS PLACEMENT (IOC);  Surgeon: Tonny Branch, MD;  Location: [facility];  Service: Ophthalmology;  Laterality: Left;  CDE: 6.99  . CATARACT EXTRACTION W/PHACO Right 10/07/2014   Procedure: CATARACT EXTRACTION PHACO AND INTRAOCULAR LENS PLACEMENT RIGHT EYE;  Surgeon: Tonny Branch, MD;  Location: [facility];  Service: Ophthalmology;  Laterality: Right;  CDE:5.16  . PACEMAKER INSERTION     SJM by JA    ROS- all systems are reviewed and negatives except as per HPI above  Current Outpatient Medications  Medication Sig Dispense Refill  . albuterol (VENTOLIN HFA) 108 (90 Base) MCG/ACT inhaler Inhale 2 puffs into the lungs every 6 (six) hours as needed for wheezing or shortness of breath. 18 g 1  . atorvastatin (LIPITOR) 80 MG  tablet Take 1 tablet (80 mg total) by mouth daily. 90 tablet 3  . diltiazem (CARDIZEM CD) 240 MG 24 hr capsule Take 1 capsule (240 mg total) by mouth daily. 4 capsule 0  . levothyroxine (SYNTHROID, LEVOTHROID) 150 MCG tablet Take 1 tablet (150 mcg total) by mouth every morning. (Needs to be seen before next refill) 90 tablet 3  . metoprolol tartrate (LOPRESSOR) 100 MG tablet Take 1/2 (one-half) tablet by mouth twice daily 90 tablet 0  . metroNIDAZOLE (METROGEL) 1 % gel Apply topically daily. 45 g 0  . nystatin (MYCOSTATIN/NYSTOP) powder Apply topically 4 (four) times daily. 15 g 0  . Vitamin D, Ergocalciferol, (DRISDOL) 1.25 MG (50000 UT) CAPS capsule Take 1 capsule (50,000 Units total) by mouth every 7 (seven) days. Takes on Thursdays. 12 capsule 3  . warfarin (COUMADIN) 3 MG tablet TAKE 1 TABLET BY MOUTH ONCE DAILY 40 tablet 6   No current facility-administered medications for this visit.     Physical Exam: Vitals:   02/21/19 0907  BP: 104/78  Pulse: 82  SpO2: 98%  Weight: 275 lb (124.7 kg)  Height: 5\' 1"  (1.549 m)    GEN- The patient is overweight appearing, alert and oriented x 3 today.   Head- normocephalic, atraumatic Eyes-  Sclera clear, conjunctiva pink Ears- hearing intact Oropharynx- clear Lungs- Clear to ausculation bilaterally, normal work of breathing Heart- irregular rate and rhythm  GI- soft  Extremities- no clubbing, cyanosis, + stable edema  Wt Readings from Last 3  Encounters:  02/21/19 275 lb (124.7 kg)  01/16/19 289 lb 6.4 oz (131.3 kg)  09/21/18 289 lb (131.1 kg)   Pacemaker interrogation reviewed and normal  Assessment and Plan:  1. Symptomatic bradycardia/ second degree AV block Normal device function Declines remotes  2. Permanent afib On coumadin  3. Obesity No improvement in weight  4. HTN Stable No change required today  Return in a year  Thompson Grayer MD, [facility] 02/21/2019 9:26 AM

## 2019-02-21 NOTE — Patient Instructions (Signed)
Medication Instructions:  Continue all current medications.  Labwork: none  Testing/Procedures: none  Follow-Up: 1 year   Any Other Special Instructions Will Be Listed Below (If Applicable).  If you need a refill on your cardiac medications before your next appointment, please call your pharmacy.  

## 2019-02-26 ENCOUNTER — Telehealth: Payer: Self-pay | Admitting: Physician Assistant

## 2019-02-26 DIAGNOSIS — I4891 Unspecified atrial fibrillation: Secondary | ICD-10-CM

## 2019-02-26 MED ORDER — DILTIAZEM HCL ER COATED BEADS 240 MG PO CP24: 240.0000 mg | ORAL_CAPSULE | Freq: Every day | ORAL | 2 refills | Status: DC

## 2019-02-26 NOTE — Telephone Encounter (Signed)
Patient notified that rx sent to pharmacy 

## 2019-02-26 NOTE — Telephone Encounter (Signed)
No, diltiazem 240 mg CD, one daily.

## 2019-02-26 NOTE — Telephone Encounter (Signed)
What is the name of the medication? diltiazem (CARDIZEM CD) 240 MG 24 hr capsule  Have you contacted your pharmacy to request a refill? no  Which pharmacy would you like this sent to? [facility]   Patient notified that their request is being sent to the clinical staff for review and that they should receive a call once it is complete. If they do not receive a call within 24 hours they can check with their pharmacy or our office.

## 2019-02-26 NOTE — Telephone Encounter (Signed)
Was going to fill rx. Does she just get 4 tablets at a time?

## 2019-02-28 ENCOUNTER — Ambulatory Visit (INDEPENDENT_AMBULATORY_CARE_PROVIDER_SITE_OTHER): Payer: Medicare Other | Admitting: *Deleted

## 2019-02-28 DIAGNOSIS — I10 Essential (primary) hypertension: Secondary | ICD-10-CM | POA: Diagnosis not present

## 2019-02-28 DIAGNOSIS — I4891 Unspecified atrial fibrillation: Secondary | ICD-10-CM | POA: Diagnosis not present

## 2019-02-28 NOTE — Patient Instructions (Signed)
Visit Information  Goals Addressed            This Visit's Progress     Patient Stated   . "I would like to stay out of the hospital" (pt-stated)       Current Barriers:  Marland Kitchen Knowledge Deficits related to respiratory disease process  Nurse Case Manager Clinical Goal(s):  Marland Kitchen Over the next 60 days, patient will work with Consulting civil engineer to determine what respiratory disease process she has and how best to manage it . Over the next 6 months, patient will not experience any respiratory complications and will not require any ED visits or hospital admissions for management  Interventions:  . Chart reviewed. Patient does not have a diagnosis of asthma, COPD, or any other chronic respiratory illness. . Discussed current respiratory condition status and history o Reports having "bronchitis" that usually requires ED visit or hospitalization  o Always happens around the end of March or April o She uses albuterol several times a week and when having a flare she will use it several times a day o She is not on a maintenance inhaler o She does not have a neublizer . Reviewed medications with patient and discussed albuterol . Discussed plans with patient for ongoing care management follow up and provided patient with direct contact information for care management team . Discussed insurance coverage. She does not have a prescription drug plan and pays out-of-pocket for all meds. She does utilize [employer], GoodRx, and [facility]'s discounted drug list  Patient Self Care Activities:  . Performs ADL's independently . Performs IADL's independently   Initial goal documentation     . Initial RN Goal (pt-stated)       Current Barriers:  . Chronic Disease Management support, education, and care coordination needs related to HTN, Afib, CHF, PVD, GERD, hypothyroidism, recurrent bronchitis  Clinical Goal(s) related to HTN, Afib, CHF, PVD, GERD, hypothyroidism, recurrent bronchitis:  Over the next 30  days, patient will:  . Work with the care management team to address educational, disease management, and care coordination needs  . Call provider office for new or worsened signs and symptoms Shortness of breath . Call care management team with questions or concerns . Verbalize basic understanding of patient centered plan of care established today  Interventions related to HTN, Afib, CHF, PVD, GERD, hypothyroidism, recurrent bronchitis:  . Evaluation of current treatment plans and patient's adherence to plan as established by provider . Assessed patient understanding of disease states . Assessed patient's education and care coordination needs . Provided disease specific education to patient  . Collaborated with appropriate clinical care team members regarding patient needs  Patient Self Care Activities related to HTN, Afib, CHF, PVD, GERD, hypothyroidism, recurrent bronchitis:  . Patient is unable to independently self-manage chronic health conditions  Initial goal documentation         The patient verbalized understanding of instructions provided today and declined a print copy of patient instruction materials.   The care management team will reach out to the patient again over the next 45 days.   Ms. Garcilazo was given information about Chronic Care Management services today including:  1. CCM service includes personalized support from designated clinical staff supervised by her physician, including individualized plan of care and coordination with other care providers 2. 24/7 contact phone numbers for assistance for urgent and routine care needs. 3. Service will only be billed when office clinical staff spend 20 minutes or more in a month to coordinate care.  4. Only one practitioner may furnish and bill the service in a calendar month. 5. The patient may stop CCM services at any time (effective at the end of the month) by phone call to the office staff. 6. The patient will be  responsible for cost sharing (co-pay) of up to 20% of the service fee (after annual deductible is met).  Patient agreed to services and verbal consent obtained.   The patient verbalized understanding of instructions provided today and declined a print copy of patient instruction materials.    Chong Sicilian, BSN, RN-BC Embedded Chronic Care Manager [facility] [place] [facility] Direct Dial: [phone]

## 2019-02-28 NOTE — Chronic Care Management (AMB) (Signed)
Chronic Care Management   Initial Visit Note  02/28/2019 Name: Megan [name] MRN: [id number] DOB: May 18, 1945  Referred by: Terald Sleeper, PA-C Reason for referral : Chronic Care Management (RN Initial Visit)   Megan [name] is a 73 y.o. year old female who is a primary care patient of Terald Sleeper, PA-C. The CCM team was consulted for assistance with chronic disease management and care coordination needs related to CHF, HTN and PVD, hypothyroidism, GERD.  Review of patient status, including review of consultants reports, relevant laboratory and other test results, and collaboration with appropriate care team members and the patient's provider was performed as part of comprehensive patient evaluation and provision of chronic care management services.    SUBJECTIVE: I spoke with Megan [name] by telephone today. She feels that she is managing her medical conditions well right now but would like to work with the CCM team. We primarily discussed her breathing concerns and use of albuterol as well as her lack of insurance coverage for prescription medications.   OBJECTIVE: Outpatient Encounter Medications as of 02/28/2019  Medication Sig   albuterol (VENTOLIN HFA) 108 (90 Base) MCG/ACT inhaler Inhale 2 puffs into the lungs every 6 (six) hours as needed for wheezing or shortness of breath.   atorvastatin (LIPITOR) 80 MG tablet Take 1 tablet (80 mg total) by mouth daily.   diltiazem (CARDIZEM CD) 240 MG 24 hr capsule Take 1 capsule (240 mg total) by mouth daily.   levothyroxine (SYNTHROID, LEVOTHROID) 150 MCG tablet Take 1 tablet (150 mcg total) by mouth every morning. (Needs to be seen before next refill)   metoprolol tartrate (LOPRESSOR) 100 MG tablet Take 1/2 (one-half) tablet by mouth twice daily   metroNIDAZOLE (METROGEL) 1 % gel Apply topically daily.   nystatin (MYCOSTATIN/NYSTOP) powder Apply topically 4 (four) times daily.   Vitamin D, Ergocalciferol, (DRISDOL) 1.25 MG (50000  UT) CAPS capsule Take 1 capsule (50,000 Units total) by mouth every 7 (seven) days. Takes on Thursdays.   warfarin (COUMADIN) 3 MG tablet TAKE 1 TABLET BY MOUTH ONCE DAILY   No facility-administered encounter medications on file as of 02/28/2019.     BP Readings from Last 3 Encounters:  02/21/19 104/78  01/16/19 139/77  09/21/18 (!) 168/80      EXAM: CHEST - 2 VIEW FROM 07/05/2017  CLINICAL DATA:  Cough, weakness  COMPARISON:  08/17/2015   FINDINGS: Left pacer remains in place, unchanged. Low lung volumes with bibasilar atelectasis. Mild vascular congestion. Heart is borderline in size. No effusions. No acute bony abnormality.   IMPRESSION: Low lung volumes, bibasilar atelectasis.  Mild vascular congestion.    RN Assessment & Care Plan    "I would like to stay out of the hospital" (pt-stated)       Current Barriers:   Knowledge Deficits related to respiratory disease process  Nurse Case Manager Clinical Goal(s):   Over the next 60 days, patient will work with RN Care Manager to determine what respiratory disease process she has and how best to manage it  Over the next 6 months, patient will not experience any respiratory complications and will not require any ED visits or hospital admissions for management  Interventions:   Chart reviewed. Patient does not have a diagnosis of asthma, COPD, or any other chronic respiratory illness.  Discussed current respiratory condition status and history o Reports having "bronchitis" that usually requires ED visit or hospitalization  o Always happens around the end of March or April o She  uses albuterol several times a week and when having a flare she will use it several times a day o She is not on a maintenance inhaler o She does not have a neublizer  Reviewed medications with patient and discussed albuterol  Discussed plans with patient for ongoing care management follow up and provided patient with direct contact information  for care management team  Discussed insurance coverage. She does not have a prescription drug plan and pays out-of-pocket for all meds. She does utilize [employer], GoodRx, and [facility]'s discounted drug list  Patient Self Care Activities:   Performs ADL's independently  Performs IADL's independently   Initial goal documentation      Initial RN Goal (pt-stated)       Current Barriers:   Chronic Disease Management support, education, and care coordination needs related to HTN, Afib, CHF, PVD, GERD, hypothyroidism, recurrent bronchitis  Clinical Goal(s) related to HTN, Afib, CHF, PVD, GERD, hypothyroidism, recurrent bronchitis:  Over the next 30 days, patient will:   Work with the care management team to address educational, disease management, and care coordination needs   Call provider office for new or worsened signs and symptoms Shortness of breath  Call care management team with questions or concerns  Verbalize basic understanding of patient centered plan of care established today  Interventions related to HTN, Afib, CHF, PVD, GERD, hypothyroidism, recurrent bronchitis:   Evaluation of current treatment plans and patient's adherence to plan as established by provider  Assessed patient understanding of disease states  Assessed patient's education and care coordination needs  Provided disease specific education to patient   Collaborated with appropriate clinical care team members regarding patient needs  Patient Self Care Activities related to HTN, Afib, CHF, PVD, GERD, hypothyroidism, recurrent bronchitis:   Patient is unable to independently self-manage chronic health conditions  Initial goal documentation         Follow-up Plan RN will reach out to patient again over the next 27 days  Chong Sicilian, BSN, RN-BC [place] [facility] [place] [facility] Direct Dial: [phone]

## 2019-03-07 ENCOUNTER — Other Ambulatory Visit: Payer: Self-pay

## 2019-03-07 ENCOUNTER — Ambulatory Visit (INDEPENDENT_AMBULATORY_CARE_PROVIDER_SITE_OTHER): Payer: Medicare Other | Admitting: *Deleted

## 2019-03-07 DIAGNOSIS — I4891 Unspecified atrial fibrillation: Secondary | ICD-10-CM | POA: Diagnosis not present

## 2019-03-07 DIAGNOSIS — Z5181 Encounter for therapeutic drug level monitoring: Secondary | ICD-10-CM | POA: Diagnosis not present

## 2019-03-07 LAB — POCT INR: INR: 1.9 — AB (ref 2.0–3.0)

## 2019-03-07 NOTE — Patient Instructions (Signed)
Increase coumadin to 1 tablet daily Keep greens consistent  Recheck in 3 weeks

## 2019-03-28 ENCOUNTER — Other Ambulatory Visit: Payer: Self-pay

## 2019-03-28 ENCOUNTER — Ambulatory Visit (INDEPENDENT_AMBULATORY_CARE_PROVIDER_SITE_OTHER): Payer: Medicare Other | Admitting: *Deleted

## 2019-03-28 DIAGNOSIS — I4891 Unspecified atrial fibrillation: Secondary | ICD-10-CM | POA: Diagnosis not present

## 2019-03-28 DIAGNOSIS — Z5181 Encounter for therapeutic drug level monitoring: Secondary | ICD-10-CM

## 2019-03-28 LAB — POCT INR: INR: 2.8 (ref 2.0–3.0)

## 2019-03-28 NOTE — Patient Instructions (Signed)
Take coumadin 1/2 tablet tonight then resume 1 tablet daily Keep greens consistent  Recheck in 3 weeks

## 2019-04-01 ENCOUNTER — Other Ambulatory Visit: Payer: Self-pay | Admitting: Physician Assistant

## 2019-04-16 ENCOUNTER — Telehealth: Payer: Self-pay | Admitting: Physician Assistant

## 2019-04-17 NOTE — Telephone Encounter (Signed)
Pt states she usually gets it at the cardiologist clinic but they don't have any openings for her after 3:30 in the afternoon so her son can take her. She said she will continue to try and call to see if they can work her in.

## 2019-04-17 NOTE — Telephone Encounter (Signed)
This patient has always gotten her INRs checked through her cardiologist office.

## 2019-04-18 ENCOUNTER — Other Ambulatory Visit: Payer: Self-pay

## 2019-04-18 ENCOUNTER — Ambulatory Visit (INDEPENDENT_AMBULATORY_CARE_PROVIDER_SITE_OTHER): Payer: Medicare Other | Admitting: *Deleted

## 2019-04-18 DIAGNOSIS — I4891 Unspecified atrial fibrillation: Secondary | ICD-10-CM

## 2019-04-18 DIAGNOSIS — Z5181 Encounter for therapeutic drug level monitoring: Secondary | ICD-10-CM | POA: Diagnosis not present

## 2019-04-18 LAB — POCT INR: INR: 2 (ref 2.0–3.0)

## 2019-04-18 NOTE — Patient Instructions (Signed)
Continue coumadin 1 tablet daily Keep greens consistent  Recheck in 3 weeks

## 2019-04-20 ENCOUNTER — Other Ambulatory Visit: Payer: Self-pay | Admitting: Internal Medicine

## 2019-05-27 ENCOUNTER — Other Ambulatory Visit: Payer: Self-pay | Admitting: Nurse Practitioner

## 2019-05-27 ENCOUNTER — Other Ambulatory Visit: Payer: Self-pay | Admitting: Physician Assistant

## 2019-05-27 DIAGNOSIS — L719 Rosacea, unspecified: Secondary | ICD-10-CM

## 2019-05-27 DIAGNOSIS — E039 Hypothyroidism, unspecified: Secondary | ICD-10-CM

## 2019-05-28 ENCOUNTER — Telehealth: Payer: Self-pay | Admitting: Physician Assistant

## 2019-05-28 ENCOUNTER — Telehealth: Payer: Self-pay

## 2019-05-28 ENCOUNTER — Other Ambulatory Visit: Payer: Self-pay | Admitting: Physician Assistant

## 2019-05-28 MED ORDER — CLOTRIMAZOLE-BETAMETHASONE 1-0.05 % EX CREA: 1.0000 "application " | TOPICAL_CREAM | Freq: Two times a day (BID) | CUTANEOUS | 1 refills | Status: DC

## 2019-05-28 NOTE — Telephone Encounter (Signed)
Patient notified

## 2019-05-28 NOTE — Telephone Encounter (Signed)
@  logo@ Medication Request  05/28/2019  What is the name of the medication? lotrisone 1%/0.05%  Have you contacted your pharmacy to request a refill? No needs new rx running out   Which pharmacy would you like this sent to? [facility]   Patient notified that their request is being sent to the clinical staff for review and that they should receive a call once it is complete. If they do not receive a call within 24 hours they can check with their pharmacy or our office.

## 2019-05-28 NOTE — Telephone Encounter (Signed)
Script sent  

## 2019-05-28 NOTE — Telephone Encounter (Signed)
Notified patient that she needs an appointment for refill on Doxycyline - patient states that she will use the gel given last time and she if that helps the break out on her face and will call back if appointment needed.

## 2019-05-30 ENCOUNTER — Other Ambulatory Visit: Payer: Self-pay

## 2019-05-30 ENCOUNTER — Ambulatory Visit (INDEPENDENT_AMBULATORY_CARE_PROVIDER_SITE_OTHER): Payer: Medicare Other | Admitting: *Deleted

## 2019-05-30 DIAGNOSIS — Z5181 Encounter for therapeutic drug level monitoring: Secondary | ICD-10-CM | POA: Diagnosis not present

## 2019-05-30 DIAGNOSIS — I4891 Unspecified atrial fibrillation: Secondary | ICD-10-CM

## 2019-05-30 LAB — POCT INR: INR: 2.4 (ref 2.0–3.0)

## 2019-05-30 NOTE — Patient Instructions (Signed)
Continue coumadin 1 tablet daily Keep greens consistent  Recheck in 4 weeks

## 2019-06-25 ENCOUNTER — Other Ambulatory Visit: Payer: Self-pay | Admitting: Internal Medicine

## 2019-06-25 IMAGING — DX DG TIBIA/FIBULA 2V*L*
2 series · 2 of 2 positions shown · non-contrast
Comparison: None.

CLINICAL DATA: Pain following recent falls

EXAM:
LEFT TIBIA AND FIBULA - 2 VIEW

[tibia ap]
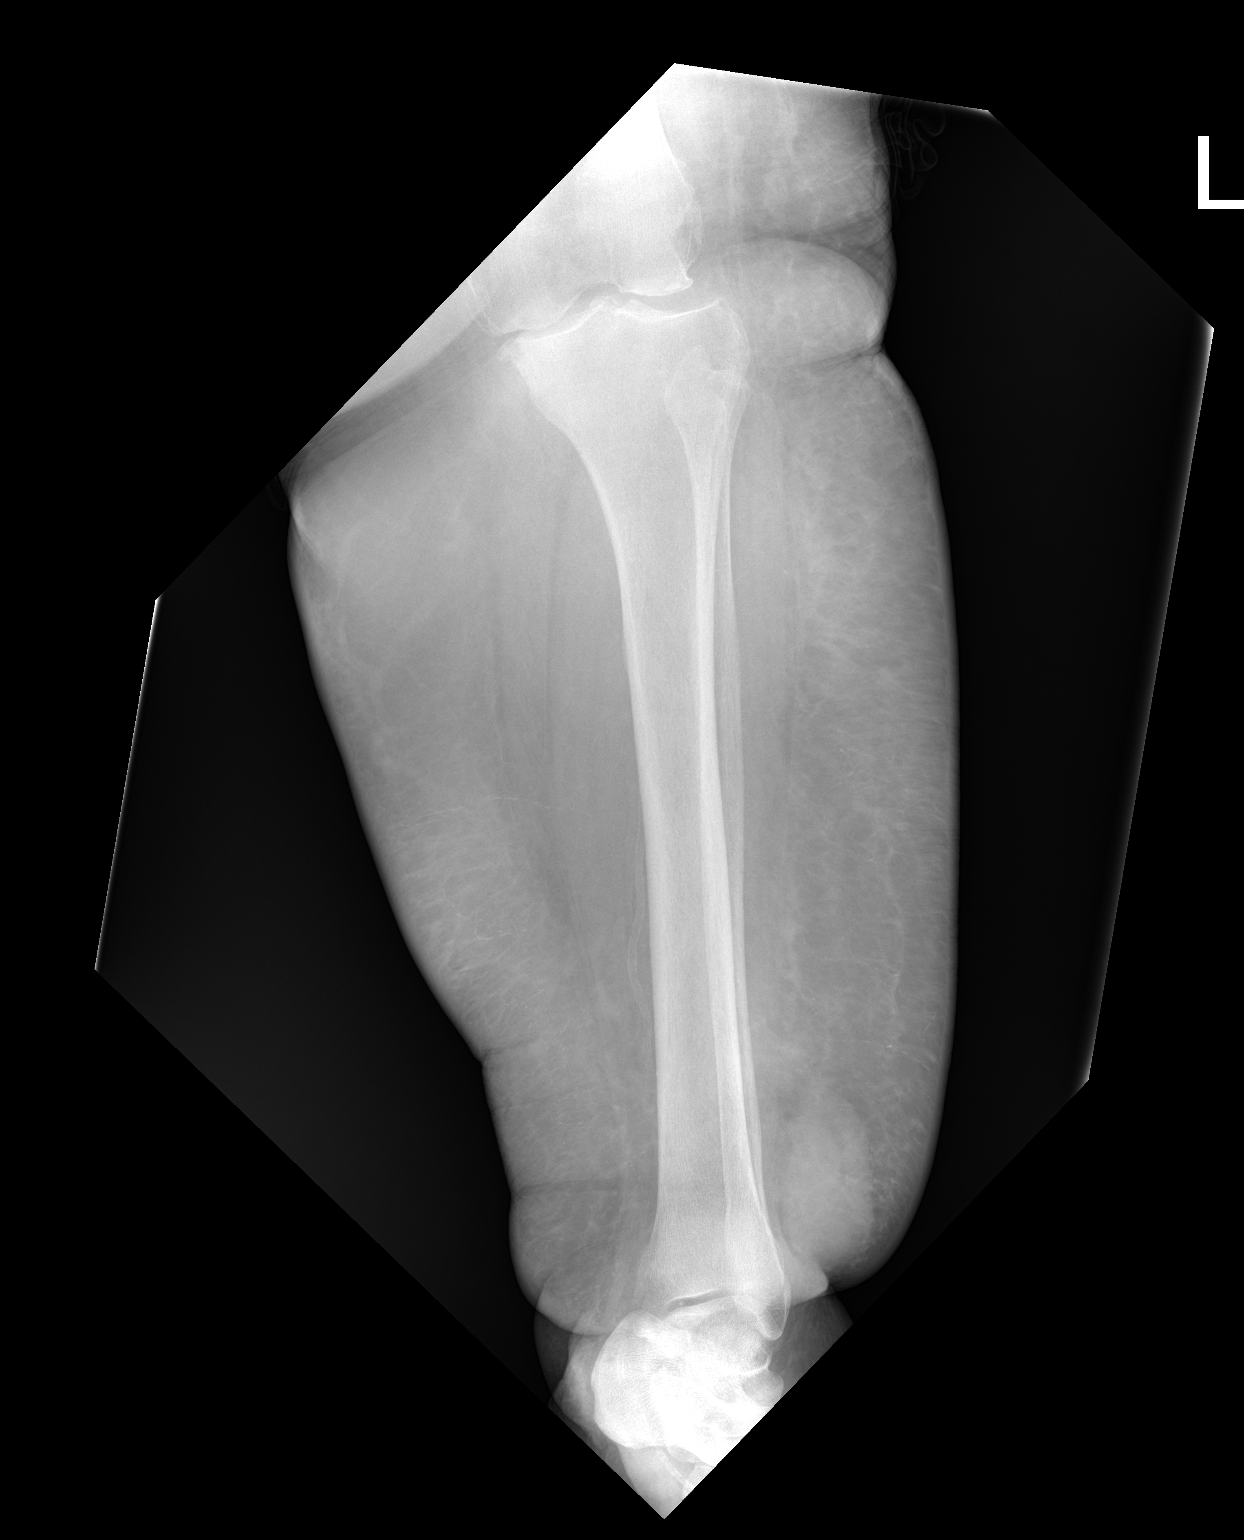

[tibia lat]
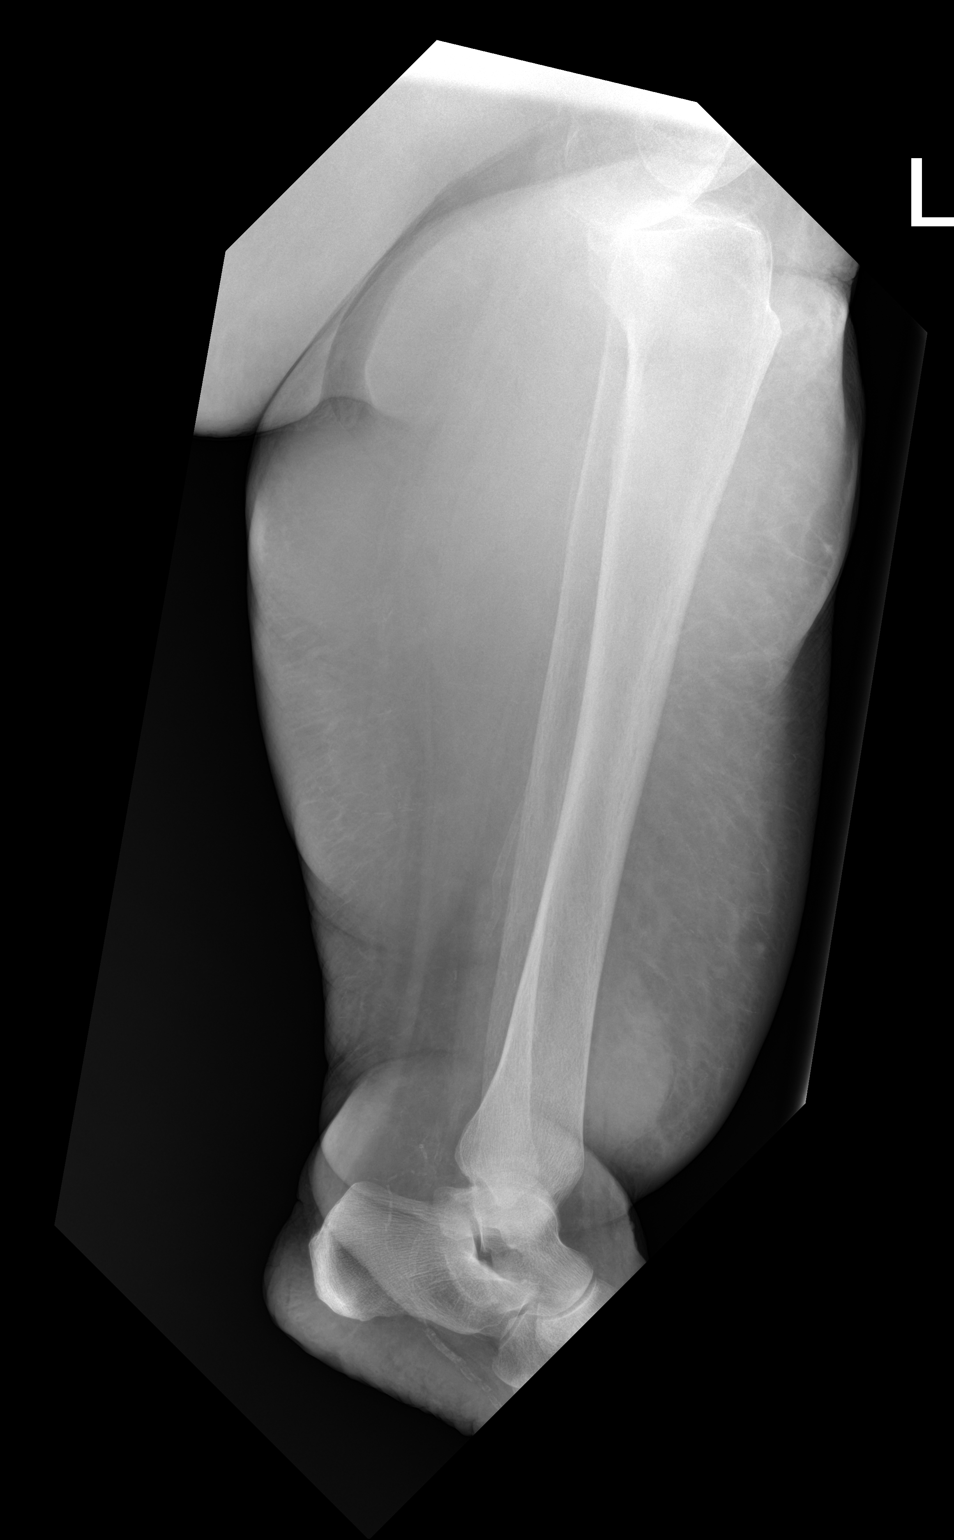

[2 of 2 positions shown; findings below may reference images not displayed]

FINDINGS: Frontal and lateral views were obtained. There is no acute fracture
or dislocation. There is osteoarthritic change in the left knee
medially. No erosive change or abnormal periosteal reaction. There
are foci of arteriovascular calcification in the ankle region.
IMPRESSION: No acute fracture or dislocation. Osteoarthritic change in medial
knee joint. Atherosclerotic calcifications are noted in the ankle
region.

## 2019-06-25 IMAGING — DX DG FINGER THUMB 2+V*R*
3 series · 3 of 3 positions shown · non-contrast
Comparison: No recent prior .

CLINICAL DATA: Fall.  Right thumb pain.

EXAM:
RIGHT THUMB 2+V

[finger ap]
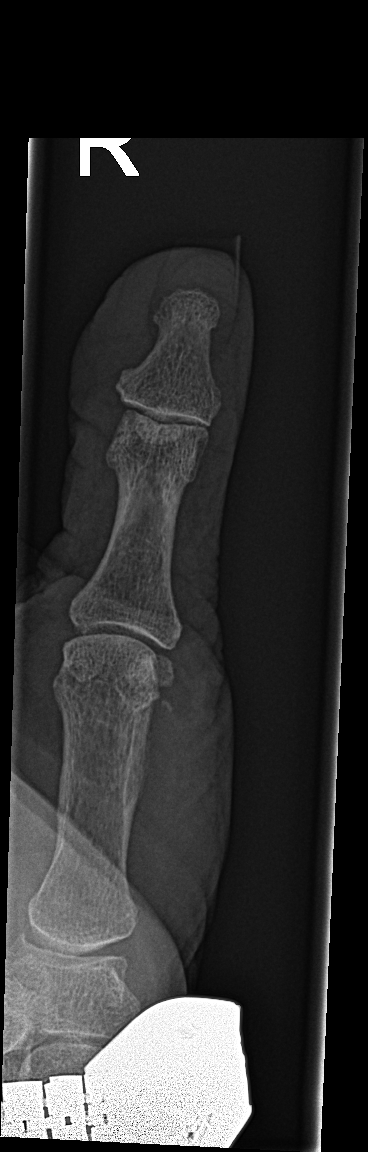

[finger obl]
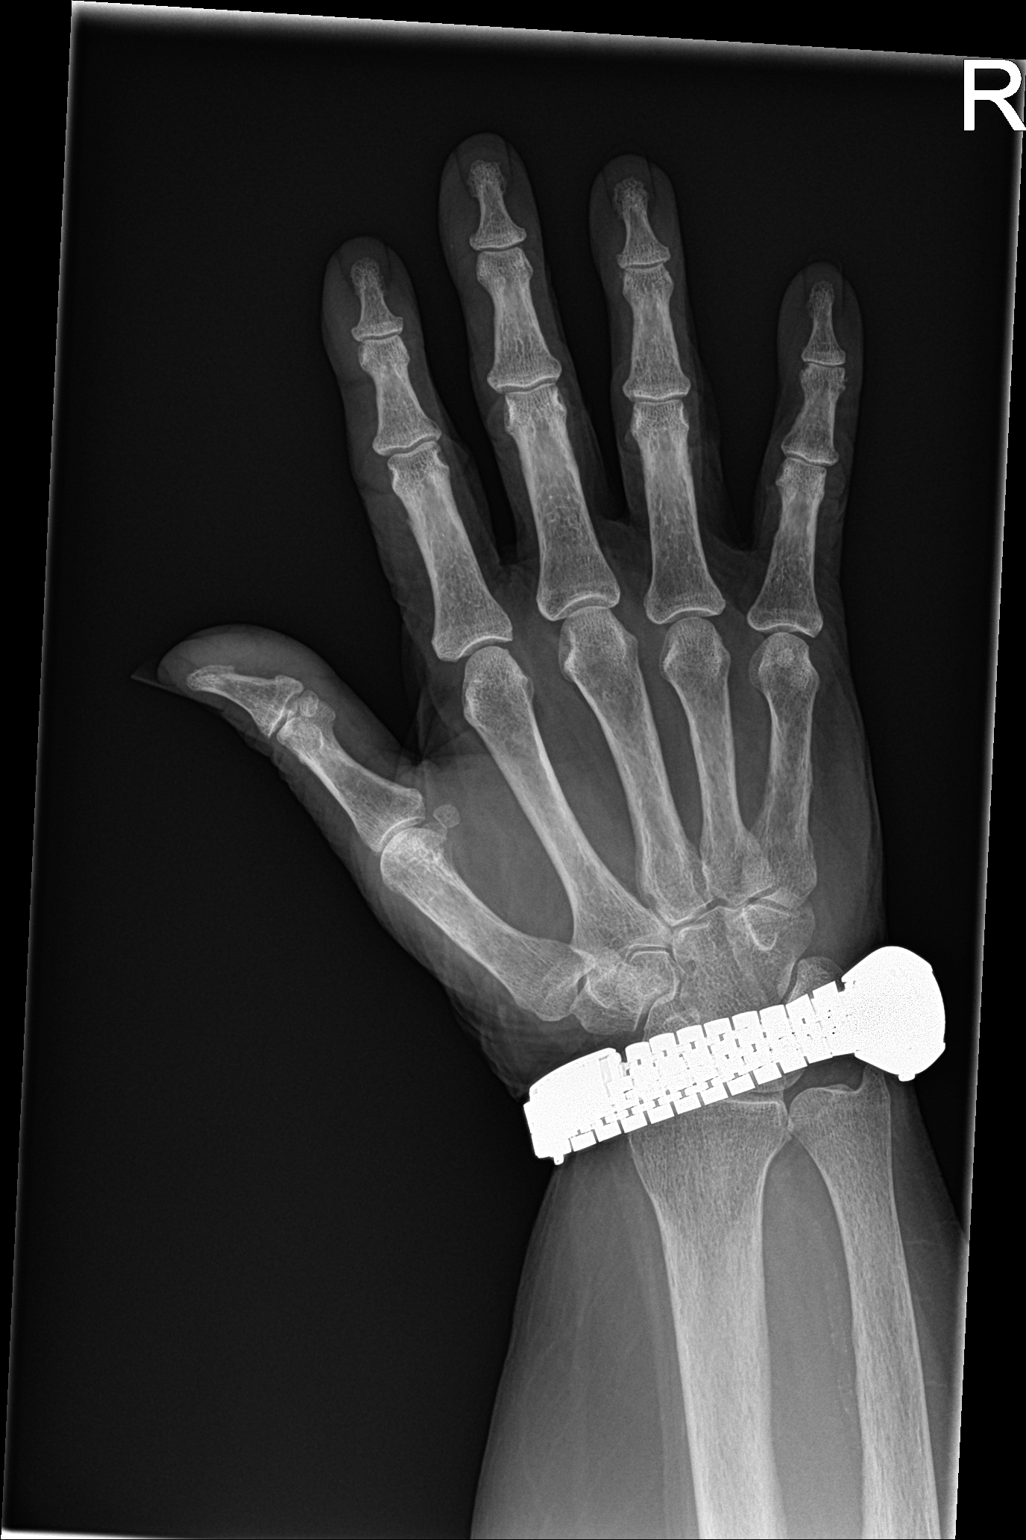

[finger lat]
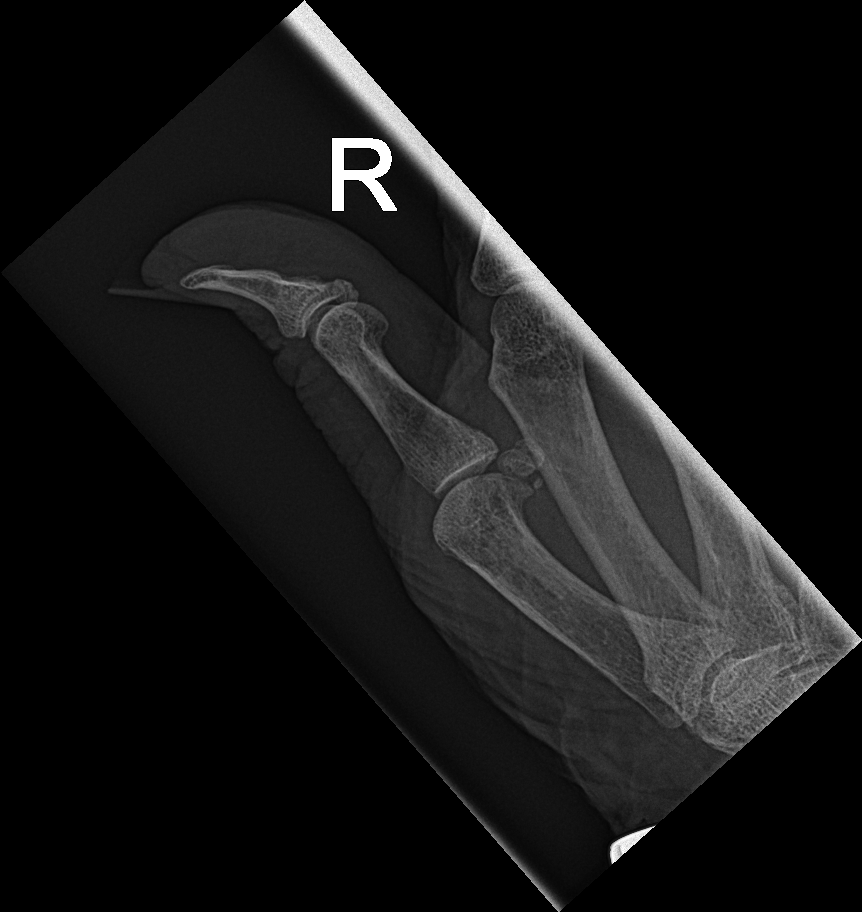

[3 of 3 positions shown; findings below may reference images not displayed]

FINDINGS: Diffuse osteopenia and degenerative change. Very subtle nondisplaced
fracture of the distal aspect of the right first metacarpal cannot
be excluded. Tiny bone chip within the adjacent soft tissues may be
present. No evidence of dislocation.
IMPRESSION: 1. Very subtle nondisplaced fracture of the distal aspect of the
right first metacarpal cannot be excluded. Tiny adjacent bone chip
within the soft tissues may be present. No evidence of dislocation.

2. Diffuse osteopenia and degenerative change.

## 2019-06-26 ENCOUNTER — Other Ambulatory Visit: Payer: Self-pay

## 2019-06-26 ENCOUNTER — Ambulatory Visit (INDEPENDENT_AMBULATORY_CARE_PROVIDER_SITE_OTHER): Payer: Medicare Other | Admitting: *Deleted

## 2019-06-26 DIAGNOSIS — I4891 Unspecified atrial fibrillation: Secondary | ICD-10-CM

## 2019-06-26 DIAGNOSIS — Z5181 Encounter for therapeutic drug level monitoring: Secondary | ICD-10-CM

## 2019-06-26 LAB — POCT INR: INR: 2.4 (ref 2.0–3.0)

## 2019-06-26 NOTE — Patient Instructions (Signed)
Continue coumadin 1 tablet daily Keep greens consistent  Recheck in 4 weeks

## 2019-07-10 ENCOUNTER — Other Ambulatory Visit: Payer: Self-pay | Admitting: *Deleted

## 2019-07-10 DIAGNOSIS — E782 Mixed hyperlipidemia: Secondary | ICD-10-CM

## 2019-07-10 MED ORDER — ATORVASTATIN CALCIUM 80 MG PO TABS: 80.0000 mg | ORAL_TABLET | Freq: Every day | ORAL | 0 refills | Status: DC

## 2019-07-16 ENCOUNTER — Ambulatory Visit (INDEPENDENT_AMBULATORY_CARE_PROVIDER_SITE_OTHER): Payer: Medicare Other | Admitting: *Deleted

## 2019-07-16 ENCOUNTER — Other Ambulatory Visit: Payer: Self-pay

## 2019-07-16 DIAGNOSIS — Z5181 Encounter for therapeutic drug level monitoring: Secondary | ICD-10-CM

## 2019-07-16 DIAGNOSIS — I4891 Unspecified atrial fibrillation: Secondary | ICD-10-CM | POA: Diagnosis not present

## 2019-07-16 LAB — POCT INR: INR: 2.4 (ref 2.0–3.0)

## 2019-07-16 NOTE — Patient Instructions (Signed)
Continue coumadin 1 tablet daily Keep greens consistent  Recheck in 4 weeks

## 2019-07-19 DIAGNOSIS — E1151 Type 2 diabetes mellitus with diabetic peripheral angiopathy without gangrene: Secondary | ICD-10-CM | POA: Diagnosis not present

## 2019-07-19 DIAGNOSIS — E1140 Type 2 diabetes mellitus with diabetic neuropathy, unspecified: Secondary | ICD-10-CM | POA: Diagnosis not present

## 2019-08-03 ENCOUNTER — Other Ambulatory Visit: Payer: Self-pay | Admitting: Internal Medicine

## 2019-08-03 NOTE — Telephone Encounter (Signed)
This is a [facility] pt °

## 2019-08-07 ENCOUNTER — Ambulatory Visit (INDEPENDENT_AMBULATORY_CARE_PROVIDER_SITE_OTHER): Payer: Medicare Other | Admitting: *Deleted

## 2019-08-07 ENCOUNTER — Other Ambulatory Visit: Payer: Self-pay

## 2019-08-07 DIAGNOSIS — I4891 Unspecified atrial fibrillation: Secondary | ICD-10-CM

## 2019-08-07 DIAGNOSIS — Z5181 Encounter for therapeutic drug level monitoring: Secondary | ICD-10-CM | POA: Diagnosis not present

## 2019-08-07 LAB — POCT INR: INR: 1.9 — AB (ref 2.0–3.0)

## 2019-08-07 NOTE — Patient Instructions (Signed)
Take 1 extra tablet tonight then resume 1 tablet daily Keep greens consistent  Recheck in 4 weeks

## 2019-08-24 ENCOUNTER — Other Ambulatory Visit: Payer: Self-pay

## 2019-08-24 ENCOUNTER — Encounter: Payer: Self-pay | Admitting: Family Medicine

## 2019-08-24 ENCOUNTER — Ambulatory Visit (INDEPENDENT_AMBULATORY_CARE_PROVIDER_SITE_OTHER): Payer: Medicare Other | Admitting: Family Medicine

## 2019-08-24 VITALS — BP 138/70 | HR 102 | Temp 97.3°F | Ht 61.0 in | Wt 291.2 lb

## 2019-08-24 DIAGNOSIS — I10 Essential (primary) hypertension: Secondary | ICD-10-CM | POA: Diagnosis not present

## 2019-08-24 DIAGNOSIS — I4891 Unspecified atrial fibrillation: Secondary | ICD-10-CM

## 2019-08-24 DIAGNOSIS — R7303 Prediabetes: Secondary | ICD-10-CM | POA: Diagnosis not present

## 2019-08-24 DIAGNOSIS — E039 Hypothyroidism, unspecified: Secondary | ICD-10-CM

## 2019-08-24 DIAGNOSIS — E782 Mixed hyperlipidemia: Secondary | ICD-10-CM | POA: Diagnosis not present

## 2019-08-24 DIAGNOSIS — I495 Sick sinus syndrome: Secondary | ICD-10-CM

## 2019-08-24 DIAGNOSIS — I5032 Chronic diastolic (congestive) heart failure: Secondary | ICD-10-CM

## 2019-08-24 DIAGNOSIS — Z7901 Long term (current) use of anticoagulants: Secondary | ICD-10-CM | POA: Diagnosis not present

## 2019-08-24 DIAGNOSIS — I509 Heart failure, unspecified: Secondary | ICD-10-CM | POA: Insufficient documentation

## 2019-08-24 LAB — BAYER DCA HB A1C WAIVED: HB A1C (BAYER DCA - WAIVED): 6 % (ref <7.0)

## 2019-08-24 MED ORDER — LEVOTHYROXINE SODIUM 150 MCG PO TABS: 150.0000 ug | ORAL_TABLET | Freq: Every day | ORAL | 1 refills | Status: DC

## 2019-08-24 MED ORDER — VITAMIN D (ERGOCALCIFEROL) 1.25 MG (50000 UNIT) PO CAPS: 50000.0000 [IU] | ORAL_CAPSULE | ORAL | 1 refills | Status: DC

## 2019-08-24 MED ORDER — ATORVASTATIN CALCIUM 80 MG PO TABS: 80.0000 mg | ORAL_TABLET | Freq: Every day | ORAL | 1 refills | Status: DC

## 2019-08-24 NOTE — Progress Notes (Signed)
Assessment & Plan:  1. Prediabetes - Controlled without medication. - CBC with Differential/Platelet - CMP14+EGFR - Bayer DCA Hb A1c Waived  2. Acquired hypothyroidism - Well controlled on current regimen.  - CBC with Differential/Platelet - TSH - levothyroxine (SYNTHROID) 150 MCG tablet; Take 1 tablet (150 mcg total) by mouth daily before breakfast.  Dispense: 90 tablet; Refill: 1  3. Mixed hyperlipidemia - Well controlled on current regimen.  - CBC with Differential/Platelet - CMP14+EGFR - Lipid panel - atorvastatin (LIPITOR) 80 MG tablet; Take 1 tablet (80 mg total) by mouth daily. (Needs to be seen before next refill)  Dispense: 90 tablet; Refill: 1  4-7. Tachycardia-bradycardia syndrome (HCC)/HYPERTENSION, BENIGN/Atrial fibrillation, unspecified type (HCC)/Chronic diastolic congestive heart failure ([place]) - Managed by cardiology.  8. Long term (current) use of anticoagulants - Managed by anticoagulation clinic.   Return in about 3 months (around 11/24/2019) for follow-up of chronic medication conditions.  Hendricks Limes, MSN, APRN, FNP-C [facility]  Subjective:    Patient ID: Megan [name], female    DOB: 1945-09-07, 74 y.o.   MRN: [id number]  Patient Care Team: Loman Brooklyn, FNP as PCP - General (Family Medicine) Thompson Grayer, MD as PCP - Electrophysiology (Cardiology) Ilean China, RN as Registered Nurse   Chief Complaint:  Chief Complaint  Patient presents with  . Follow-up    Previous AJ pt    HPI: Megan [name] is a 74 y.o. female presenting on 08/24/2019 for Follow-up (Previous AJ pt)  Patient has a cardiologist that manages heart failure, atrial fibrillation, hypertension, pacemaker, and tachycardia-bradycardia syndrome.  Patient reports he has had multiple sleep studies and she has always been told that she has sleep apnea, but her last one did not show sleep apnea.  She is no longer wearing a CPAP.  She sleeps  sitting upright in her chair.   Social history:  Relevant past medical, surgical, family and social history reviewed and updated as indicated. Interim medical history since our last visit reviewed.  Allergies and medications reviewed and updated.  DATA REVIEWED: CHART IN EPIC  ROS: Negative unless specifically indicated above in HPI.    Current Outpatient Medications:  .  albuterol (VENTOLIN HFA) 108 (90 Base) MCG/ACT inhaler, Inhale 2 puffs into the lungs every 6 (six) hours as needed for wheezing or shortness of breath., Disp: 18 g, Rfl: 1 .  atorvastatin (LIPITOR) 80 MG tablet, Take 1 tablet (80 mg total) by mouth daily. (Needs to be seen before next refill), Disp: 90 tablet, Rfl: 1 .  clotrimazole-betamethasone (LOTRISONE) cream, Apply 1 application topically 2 (two) times daily., Disp: 45 g, Rfl: 1 .  diltiazem (CARDIZEM CD) 240 MG 24 hr capsule, Take 1 capsule (240 mg total) by mouth daily., Disp: 30 capsule, Rfl: 2 .  levothyroxine (SYNTHROID) 150 MCG tablet, Take 1 tablet (150 mcg total) by mouth daily before breakfast., Disp: 90 tablet, Rfl: 1 .  metoprolol tartrate (LOPRESSOR) 100 MG tablet, Take 1/2 (one-half) tablet by mouth twice daily, Disp: 90 tablet, Rfl: 2 .  metroNIDAZOLE (METROGEL) 1 % gel, Apply topically daily., Disp: 45 g, Rfl: 0 .  nystatin (MYCOSTATIN/NYSTOP) powder, Apply topically 4 (four) times daily., Disp: 15 g, Rfl: 0 .  Vitamin D, Ergocalciferol, (DRISDOL) 1.25 MG (50000 UNIT) CAPS capsule, Take 1 capsule (50,000 Units total) by mouth every 7 (seven) days., Disp: 12 capsule, Rfl: 1 .  warfarin (COUMADIN) 3 MG tablet, Take 1 tablet by mouth once daily, Disp: 40 tablet,  Rfl: 0   No Known Allergies Past Medical History:  Diagnosis Date  . Acute diastolic heart failure ([place])   . Anxiety state, unspecified   . Congestive heart failure, unspecified   . Degenerative joint disease   . GERD (gastroesophageal reflux disease)   . Hyperlipidemia   . Hypertension    . Lymphedema   . Obesity   . Persistent atrial fibrillation (HCC)     post-termination pauses with afib s/p PPM  . Presence of permanent cardiac pacemaker   . Sinoatrial node dysfunction (HCC)    s/p PPM  . Sleep apnea    compliant with CPAP  . Unspecified hypothyroidism   . Unspecified venous (peripheral) insufficiency     Past Surgical History:  Procedure Laterality Date  . CATARACT EXTRACTION W/PHACO Left 09/09/2014   Procedure: CATARACT EXTRACTION PHACO AND INTRAOCULAR LENS PLACEMENT (IOC);  Surgeon: Tonny Branch, MD;  Location: [facility];  Service: Ophthalmology;  Laterality: Left;  CDE: 6.99  . CATARACT EXTRACTION W/PHACO Right 10/07/2014   Procedure: CATARACT EXTRACTION PHACO AND INTRAOCULAR LENS PLACEMENT RIGHT EYE;  Surgeon: Tonny Branch, MD;  Location: [facility];  Service: Ophthalmology;  Laterality: Right;  CDE:5.16  . PACEMAKER INSERTION     SJM by JA    Social History   Socioeconomic History  . Marital status: Widowed    Spouse name: Not on file  . Number of children: 1  . Years of education: Not on file  . Highest education level: High school graduate  Occupational History  . Occupation: RETIRED    Employer: RETIRED  Tobacco Use  . Smoking status: Former Smoker    Packs/day: 0.50    Years: 10.00    Pack years: 5.00    Types: Cigarettes    Quit date: 03/29/1978    Years since quitting: 41.4  . Smokeless tobacco: Never Used  Substance and Sexual Activity  . Alcohol use: No    Alcohol/week: 0.0 standard drinks  . Drug use: No  . Sexual activity: Not Currently    Birth control/protection: None  Other Topics Concern  . Not on file  Social History Narrative  . Not on file   Social Determinants of Health   Financial Resource Strain: Low Risk   . Difficulty of Paying Living Expenses: Not hard at all  Food Insecurity: No Food Insecurity  . Worried About Charity fundraiser in the Last Year: Never true  . Ran Out of Food in the Last Year: Never true  Transportation  Needs: No Transportation Needs  . Lack of Transportation (Medical): No  . Lack of Transportation (Non-Medical): No  Physical Activity: Inactive  . Days of Exercise per Week: 0 days  . Minutes of Exercise per Session: 0 min  Stress: No Stress Concern Present  . Feeling of Stress : Not at all  Social Connections: Unknown  . Frequency of Communication with Friends and Family: More than three times a week  . Frequency of Social Gatherings with Friends and Family: More than three times a week  . Attends Religious Services: Not on file  . Active Member of Clubs or Organizations: Not on file  . Attends Archivist Meetings: Not on file  . Marital Status: Widowed  Intimate Partner Violence: Not At Risk  . Fear of Current or Ex-Partner: No  . Emotionally Abused: No  . Physically Abused: No  . Sexually Abused: No        Objective:    BP 138/70  Pulse (!) 102   Temp (!) 97.3 F (36.3 C) (Temporal)   Ht '5\' 1"'  (1.549 m)   Wt 291 lb 3.2 oz (132.1 kg)   BMI 55.02 kg/m   Wt Readings from Last 3 Encounters:  08/24/19 291 lb 3.2 oz (132.1 kg)  02/21/19 275 lb (124.7 kg)  01/16/19 289 lb 6.4 oz (131.3 kg)    Physical Exam Vitals reviewed.  Constitutional:      General: She is not in acute distress.    Appearance: Normal appearance. She is morbidly obese. She is not ill-appearing, toxic-appearing or diaphoretic.  HENT:     Head: Normocephalic and atraumatic.  Eyes:     General: No scleral icterus.       Right eye: No discharge.        Left eye: No discharge.     Conjunctiva/sclera: Conjunctivae normal.  Cardiovascular:     Rate and Rhythm: Normal rate and regular rhythm.     Heart sounds: Normal heart sounds. No murmur. No friction rub. No gallop.   Pulmonary:     Effort: Pulmonary effort is normal. No respiratory distress.     Breath sounds: Normal breath sounds. No stridor. No wheezing, rhonchi or rales.  Musculoskeletal:        General: Normal range of motion.      Cervical back: Normal range of motion.     Right lower leg: Edema present.     Left lower leg: Edema present.  Skin:    General: Skin is warm and dry.     Capillary Refill: Capillary refill takes less than 2 seconds.  Neurological:     General: No focal deficit present.     Mental Status: She is alert and oriented to person, place, and time. Mental status is at baseline.  Psychiatric:        Mood and Affect: Mood normal.        Behavior: Behavior normal.        Thought Content: Thought content normal.        Judgment: Judgment normal.     Lab Results  Component Value Date   TSH 3.100 01/16/2019   Lab Results  Component Value Date   WBC 10.0 01/16/2019   HGB 15.2 01/16/2019   HCT 45.3 01/16/2019   MCV 91 01/16/2019   PLT 301 01/16/2019   Lab Results  Component Value Date   NA 138 01/16/2019   K 4.6 01/16/2019   CO2 19 (L) 01/16/2019   GLUCOSE 145 (H) 01/16/2019   BUN 18 01/16/2019   CREATININE 1.17 (H) 01/16/2019   BILITOT 1.6 (H) 01/16/2019   ALKPHOS 173 (H) 01/16/2019   AST 17 01/16/2019   ALT 21 01/16/2019   PROT 7.2 01/16/2019   ALBUMIN 4.2 01/16/2019   CALCIUM 9.7 01/16/2019   ANIONGAP 13 07/06/2017   Lab Results  Component Value Date   CHOL 186 01/16/2019   Lab Results  Component Value Date   HDL 48 01/16/2019   Lab Results  Component Value Date   LDLCALC 113 (H) 01/16/2019   Lab Results  Component Value Date   TRIG 143 01/16/2019   Lab Results  Component Value Date   CHOLHDL 3.9 01/16/2019   Lab Results  Component Value Date   HGBA1C 6.0 08/24/2019

## 2019-08-28 LAB — CBC WITH DIFFERENTIAL/PLATELET
Basophils Absolute: 0.1 10*3/uL (ref 0.0–0.2)
Basos: 1 %
EOS (ABSOLUTE): 0.1 10*3/uL (ref 0.0–0.4)
Eos: 1 %
Hematocrit: 48.5 % — ABNORMAL HIGH (ref 34.0–46.6)
Hemoglobin: 15.8 g/dL (ref 11.1–15.9)
Immature Grans (Abs): 0.1 10*3/uL (ref 0.0–0.1)
Immature Granulocytes: 1 %
Lymphocytes Absolute: 1.1 10*3/uL (ref 0.7–3.1)
Lymphs: 13 %
MCH: 30.3 pg (ref 26.6–33.0)
MCHC: 32.6 g/dL (ref 31.5–35.7)
MCV: 93 fL (ref 79–97)
Monocytes Absolute: 0.6 10*3/uL (ref 0.1–0.9)
Monocytes: 7 %
Neutrophils Absolute: 6.6 10*3/uL (ref 1.4–7.0)
Neutrophils: 77 %
Platelets: 255 10*3/uL (ref 150–450)
RBC: 5.21 x10E6/uL (ref 3.77–5.28)
RDW: 13.9 % (ref 11.7–15.4)
WBC: 8.5 10*3/uL (ref 3.4–10.8)

## 2019-08-28 LAB — CMP14+EGFR

## 2019-08-28 LAB — LIPID PANEL

## 2019-08-28 LAB — TSH

## 2019-08-29 ENCOUNTER — Telehealth: Payer: Self-pay | Admitting: Family Medicine

## 2019-08-29 NOTE — Telephone Encounter (Signed)
  Incoming Patient Call  08/29/2019  What symptoms do you have? Chills joint pain and really pain all over  How long have you been sick? yesterday  Have you been seen for this problem? No--she cannot come to re-do labs because she is not feeling good to come in  If your provider decides to give you a prescription, which pharmacy would you like for it to be sent to? [facility]   Patient informed that this information will be sent to the clinical staff for review and that they should receive a follow up call.

## 2019-08-30 ENCOUNTER — Encounter: Payer: Self-pay | Admitting: Family

## 2019-08-30 ENCOUNTER — Ambulatory Visit (INDEPENDENT_AMBULATORY_CARE_PROVIDER_SITE_OTHER): Payer: Medicare Other | Admitting: Family

## 2019-08-30 DIAGNOSIS — L03116 Cellulitis of left lower limb: Secondary | ICD-10-CM | POA: Diagnosis not present

## 2019-08-30 MED ORDER — CEPHALEXIN 500 MG PO CAPS: 500.0000 mg | ORAL_CAPSULE | Freq: Three times a day (TID) | ORAL | 0 refills | Status: DC

## 2019-08-30 NOTE — Telephone Encounter (Signed)
BUSY 

## 2019-08-30 NOTE — Progress Notes (Signed)
   Virtual Visit via telephone Note Due to COVID-19 pandemic this visit was conducted virtually. This visit type was conducted due to national recommendations for restrictions regarding the COVID-19 Pandemic (e.g. social distancing, sheltering in place) in an effort to limit this patient's exposure and mitigate transmission in our community. All issues noted in this document were discussed and addressed.  A physical exam was not performed with this format.  I connected with Megan [name] on 08/30/19 at 8:36 AM by telephone and verified that I am speaking with the correct person using two identifiers. Megan [name] is currently located at home  and son  is currently with her  during visit. The provider, Evelina Dun, FNP is located in their office at time of visit.  I discussed the limitations, risks, security and privacy concerns of performing an evaluation and management service by telephone and the availability of in person appointments. I also discussed with the patient that there may be a patient responsible charge related to this service. The patient expressed understanding and agreed to proceed.   History and Present Illness:  HPI  Pt calls the office with left swelling, tenderness, warmth and redness. She reports she has had cellulitis in the past in the same leg and feels like this feels similar. She has been taking tylenol for fever and pain with mild relief.    Review of Systems  Skin:       Redness leg  All other systems reviewed and are negative.    Observations/Objective: No SOB or distress noted   Assessment and Plan: 1. Cellulitis of left lower extremity Keep elevated Report any increased redness, fevers, or swelling Start Keflex today, and follow up in 1 week to recheck  - cephALEXin (KEFLEX) 500 MG capsule; Take 1 capsule (500 mg total) by mouth 3 (three) times daily.  Dispense: 30 capsule; Refill: 0    I discussed the assessment and treatment plan with the  patient. The patient was provided an opportunity to ask questions and all were answered. The patient agreed with the plan and demonstrated an understanding of the instructions.   The patient was advised to call back or seek an in-person evaluation if the symptoms worsen or if the condition fails to improve as anticipated.  The above assessment and management plan was discussed with the patient. The patient verbalized understanding of and has agreed to the management plan. Patient is aware to call the clinic if symptoms persist or worsen. Patient is aware when to return to the clinic for a follow-up visit. Patient educated on when it is appropriate to go to the emergency department.   Time call ended: 8:46 AM   I provided 10 minutes of non-face-to-face time during this encounter.    Evelina Dun, FNP

## 2019-09-03 NOTE — Telephone Encounter (Signed)
Televisit done 08/30/19

## 2019-09-04 ENCOUNTER — Other Ambulatory Visit: Payer: Self-pay

## 2019-09-04 ENCOUNTER — Ambulatory Visit (INDEPENDENT_AMBULATORY_CARE_PROVIDER_SITE_OTHER): Payer: Medicare Other | Admitting: *Deleted

## 2019-09-04 DIAGNOSIS — Z5181 Encounter for therapeutic drug level monitoring: Secondary | ICD-10-CM | POA: Diagnosis not present

## 2019-09-04 DIAGNOSIS — I4891 Unspecified atrial fibrillation: Secondary | ICD-10-CM

## 2019-09-04 LAB — POCT INR: INR: 2.7 (ref 2.0–3.0)

## 2019-09-04 NOTE — Patient Instructions (Signed)
Continue warfarin 1 tablet daily Keep greens consistent (Eat extra tonight) Recheck in 4 weeks

## 2019-09-10 ENCOUNTER — Ambulatory Visit: Payer: Medicare Other | Admitting: Family

## 2019-09-12 ENCOUNTER — Encounter: Payer: Self-pay | Admitting: Physician Assistant

## 2019-09-12 ENCOUNTER — Other Ambulatory Visit: Payer: Self-pay

## 2019-09-12 ENCOUNTER — Ambulatory Visit (INDEPENDENT_AMBULATORY_CARE_PROVIDER_SITE_OTHER): Payer: Medicare Other | Admitting: Physician Assistant

## 2019-09-12 VITALS — BP 136/77 | HR 82 | Temp 97.8°F | Resp 20 | Ht 61.0 in | Wt 291.0 lb

## 2019-09-12 DIAGNOSIS — E039 Hypothyroidism, unspecified: Secondary | ICD-10-CM | POA: Diagnosis not present

## 2019-09-12 DIAGNOSIS — N289 Disorder of kidney and ureter, unspecified: Secondary | ICD-10-CM

## 2019-09-12 DIAGNOSIS — E782 Mixed hyperlipidemia: Secondary | ICD-10-CM | POA: Diagnosis not present

## 2019-09-12 DIAGNOSIS — L03116 Cellulitis of left lower limb: Secondary | ICD-10-CM | POA: Diagnosis not present

## 2019-09-12 DIAGNOSIS — R7303 Prediabetes: Secondary | ICD-10-CM | POA: Diagnosis not present

## 2019-09-12 NOTE — Progress Notes (Signed)
  Subjective:     Patient ID: Megan [name], female   DOB: [birth year], 74 y.o.   MRN: [id number]  HPI Pt here for f/u of 2 reasons #1- follow regarding cellulitis to the LLE Hx of similar prev Contacted office through telemed - see note Redness pain and swelling to the LLE Similar always occur around prev laceration to the prox L lateral tibia area Those sx have all improved since being on the keflex #2- pt needs f/u labs Unable to process prev request Provider wanting CMP, lipid panel, and TSH  Review of Systems  Constitutional: Negative.   Skin: Negative.        Objective:   Physical Exam Vitals and nursing note reviewed. Exam conducted with a chaperone present.   Ambulating with cane Well healed scar to the L lateral prox tibia area No surrounding erythema, edema, or induration noted No TTP of same No pitting edema noted to the lower ext bilat No drainage noted FROM of the knee and ankle     Assessment:     1. Cellulitis of left lower extremity   2. Prediabetes   3. Renal insufficiency   4. Acquired hypothyroidism   5. Mixed hyperlipidemia        Plan:     Pt can stop ATB at this time Acitivities as tol Labs drawn today per request and will inform of lab results Keep reg f/u with reg provider

## 2019-09-12 NOTE — Patient Instructions (Signed)

## 2019-09-13 LAB — LIPID PANEL
Chol/HDL Ratio: 4.1 ratio (ref 0.0–4.4)
Cholesterol, Total: 207 mg/dL — ABNORMAL HIGH (ref 100–199)
HDL: 50 mg/dL (ref >39)
LDL Chol Calc (NIH): 131 mg/dL — ABNORMAL HIGH (ref 0–99)
Triglycerides: 143 mg/dL (ref 0–149)
VLDL Cholesterol Cal: 26 mg/dL (ref 5–40)

## 2019-09-13 LAB — CMP14+EGFR
ALT: 21 IU/L (ref 0–32)
AST: 19 IU/L (ref 0–40)
Albumin/Globulin Ratio: 1.4 (ref 1.2–2.2)
Albumin: 4.2 g/dL (ref 3.7–4.7)
Alkaline Phosphatase: 166 IU/L — ABNORMAL HIGH (ref 48–121)
BUN/Creatinine Ratio: 16 (ref 12–28)
BUN: 17 mg/dL (ref 8–27)
Bilirubin Total: 1 mg/dL (ref 0.0–1.2)
CO2: 19 mmol/L — ABNORMAL LOW (ref 20–29)
Calcium: 9.3 mg/dL (ref 8.7–10.3)
Chloride: 101 mmol/L (ref 96–106)
Creatinine, Ser: 1.04 mg/dL — ABNORMAL HIGH (ref 0.57–1.00)
GFR calc Af Amer: 61 mL/min/{1.73_m2} (ref >59)
GFR calc non Af Amer: 53 mL/min/{1.73_m2} — ABNORMAL LOW (ref >59)
Globulin, Total: 3.1 g/dL (ref 1.5–4.5)
Glucose: 135 mg/dL — ABNORMAL HIGH (ref 65–99)
Potassium: 4.3 mmol/L (ref 3.5–5.2)
Sodium: 140 mmol/L (ref 134–144)
Total Protein: 7.3 g/dL (ref 6.0–8.5)

## 2019-09-13 LAB — TSH: TSH: 3.68 u[IU]/mL (ref 0.450–4.500)

## 2019-09-16 ENCOUNTER — Other Ambulatory Visit: Payer: Self-pay | Admitting: Internal Medicine

## 2019-09-18 ENCOUNTER — Telehealth: Payer: Self-pay | Admitting: Family

## 2019-09-18 ENCOUNTER — Telehealth: Payer: Self-pay | Admitting: *Deleted

## 2019-09-18 NOTE — Telephone Encounter (Signed)
Pt would like a call to discuss her lab results.  °

## 2019-09-18 NOTE — Telephone Encounter (Signed)
Labs will be reviewed once provider is back in office

## 2019-09-18 NOTE — Telephone Encounter (Signed)
Spoke with patient.  She's not sure if she took an extra warfarin tablet several nights ago or not.  Says she has eaten extra greens since then.  Told pt to continue current dose of warfarin and start taking at 5pm instead of bedtime when she's not so sleepy.  She verbalized understanding and agreement.

## 2019-09-18 NOTE — Telephone Encounter (Signed)
Patient called stating that she is confused if she has taken her coumdin

## 2019-09-19 ENCOUNTER — Encounter: Payer: Self-pay | Admitting: *Deleted

## 2019-10-02 ENCOUNTER — Other Ambulatory Visit: Payer: Self-pay

## 2019-10-02 ENCOUNTER — Ambulatory Visit (INDEPENDENT_AMBULATORY_CARE_PROVIDER_SITE_OTHER): Payer: Medicare Other | Admitting: *Deleted

## 2019-10-02 DIAGNOSIS — Z5181 Encounter for therapeutic drug level monitoring: Secondary | ICD-10-CM | POA: Diagnosis not present

## 2019-10-02 DIAGNOSIS — I4891 Unspecified atrial fibrillation: Secondary | ICD-10-CM | POA: Diagnosis not present

## 2019-10-02 LAB — POCT INR: INR: 2 (ref 2.0–3.0)

## 2019-10-02 NOTE — Patient Instructions (Signed)
Take warfarin 1 1/2 tablets tonight then resume 1 tablet daily Keep greens consistent  Recheck in 3 weeks

## 2019-10-03 ENCOUNTER — Telehealth: Payer: Self-pay | Admitting: Family

## 2019-10-03 NOTE — Telephone Encounter (Signed)
Patient going to come and pickup

## 2019-10-15 ENCOUNTER — Telehealth: Payer: Self-pay | Admitting: Family

## 2019-10-15 DIAGNOSIS — I4891 Unspecified atrial fibrillation: Secondary | ICD-10-CM

## 2019-10-15 MED ORDER — DILTIAZEM HCL ER COATED BEADS 240 MG PO CP24: 240.0000 mg | ORAL_CAPSULE | Freq: Every day | ORAL | 2 refills | Status: DC

## 2019-10-15 NOTE — Telephone Encounter (Signed)
Medication sent to pharmacy  

## 2019-10-15 NOTE — Telephone Encounter (Signed)
  Prescription Request  10/15/2019  What is the name of the medication or equipment? diltiazem (CARDIZEM CD) 240 MG 24 hr capsule  Have you contacted your pharmacy to request a refill? (if applicable) no  Which pharmacy would you like this sent to[facility] number [phone]   Patient notified that their request is being sent to the clinical staff for review and that they should receive a response within 2 business days.

## 2019-10-17 ENCOUNTER — Telehealth: Payer: Self-pay | Admitting: Family

## 2019-10-19 ENCOUNTER — Ambulatory Visit (INDEPENDENT_AMBULATORY_CARE_PROVIDER_SITE_OTHER): Payer: Medicare Other

## 2019-10-19 ENCOUNTER — Other Ambulatory Visit: Payer: Self-pay | Admitting: *Deleted

## 2019-10-19 DIAGNOSIS — Z0000 Encounter for general adult medical examination without abnormal findings: Secondary | ICD-10-CM

## 2019-10-19 DIAGNOSIS — I4891 Unspecified atrial fibrillation: Secondary | ICD-10-CM

## 2019-10-19 MED ORDER — DILTIAZEM HCL ER COATED BEADS 240 MG PO CP24: 240.0000 mg | ORAL_CAPSULE | Freq: Every day | ORAL | 2 refills | Status: DC

## 2019-10-19 NOTE — Progress Notes (Addendum)
MEDICARE ANNUAL WELLNESS VISIT  10/19/2019  Telephone Visit Disclaimer This Medicare AWV was conducted by telephone due to national recommendations for restrictions regarding the COVID-19 Pandemic (e.g. social distancing).  I verified, using two identifiers, that I am speaking with Megan [name] or their authorized healthcare agent. I discussed the limitations, risks, security, and privacy concerns of performing an evaluation and management service by telephone and the potential availability of an in-person appointment in the future. The patient expressed understanding and agreed to proceed.   Subjective:  Megan [name] is a 74 y.o. female patient of Hawks, Theador Hawthorne, FNP who had a Medicare Annual Wellness Visit today via telephone. Ranee is Disabled and lives with their family. she has one child. she reports that she is not socially active and does interact with friends/family regularly. she is not physically active and enjoys television.  Patient Care Team: Sharion Balloon, FNP as PCP - General (Family Medicine) Thompson Grayer, MD as PCP - Electrophysiology (Cardiology) Ilean China, RN as Registered Nurse  Advanced Directives 10/19/2019 10/18/2018 06/25/2018 07/05/2017 03/14/2017 12/04/2015 12/02/2015  Does Patient Have a Medical Advance Directive? No No No No No No No  Would patient like information on creating a medical advance directive? No - Patient declined No - Patient declined No - Patient declined No - Patient declined No - Patient declined No - patient declined information No - patient declined information    Hospital Utilization Over the Past 12 Months: # of hospitalizations or ER visits: 0 # of surgeries: 0  Review of Systems    Patient reports that her overall health is unchanged compared to last year.    Patient Reported Readings (BP, Pulse, CBG, Weight, etc) none  Pain Assessment Pain : No/denies pain     Current Medications & Allergies  (verified) Allergies as of 10/19/2019   No Known Allergies      Medication List        Accurate as of October 19, 2019  9:50 AM. If you have any questions, ask your nurse or doctor.          STOP taking these medications    cephALEXin 500 MG capsule Commonly known as: KEFLEX       TAKE these medications    albuterol 108 (90 Base) MCG/ACT inhaler Commonly known as: VENTOLIN HFA Inhale 2 puffs into the lungs every 6 (six) hours as needed for wheezing or shortness of breath.   atorvastatin 80 MG tablet Commonly known as: LIPITOR Take 1 tablet (80 mg total) by mouth daily. (Needs to be seen before next refill)   clotrimazole-betamethasone cream Commonly known as: LOTRISONE Apply 1 application topically 2 (two) times daily.   diltiazem 240 MG 24 hr capsule Commonly known as: CARDIZEM CD Take 1 capsule (240 mg total) by mouth daily.   levothyroxine 150 MCG tablet Commonly known as: SYNTHROID Take 1 tablet (150 mcg total) by mouth daily before breakfast.   metoprolol tartrate 100 MG tablet Commonly known as: LOPRESSOR Take 1/2 (one-half) tablet by mouth twice daily   metroNIDAZOLE 1 % gel Commonly known as: Metrogel Apply topically daily.   nystatin powder Commonly known as: MYCOSTATIN/NYSTOP Apply topically 4 (four) times daily.   Vitamin D (Ergocalciferol) 1.25 MG (50000 UNIT) Caps capsule Commonly known as: DRISDOL Take 1 capsule (50,000 Units total) by mouth every 7 (seven) days.   warfarin 3 MG tablet Commonly known as: COUMADIN Take as directed by the anticoagulation clinic. If you are  unsure how to take this medication, talk to your nurse or doctor. Original instructions: Take 1 tablet by mouth once daily        History (reviewed): Past Medical History:  Diagnosis Date   Acute diastolic heart failure (HCC)    Anxiety state, unspecified    Congestive heart failure, unspecified    Degenerative joint disease    GERD (gastroesophageal reflux  disease)    Hyperlipidemia    Hypertension    Lymphedema    Obesity    OSA (obstructive sleep apnea) 06/09/2015   Persistent atrial fibrillation (HCC)     post-termination pauses with afib s/p PPM   Presence of permanent cardiac pacemaker    Sinoatrial node dysfunction (HCC)    s/p PPM   Sleep apnea    compliant with CPAP   Unspecified hypothyroidism    Unspecified venous (peripheral) insufficiency    Past Surgical History:  Procedure Laterality Date   CATARACT EXTRACTION W/PHACO Left 09/09/2014   Procedure: CATARACT EXTRACTION PHACO AND INTRAOCULAR LENS PLACEMENT (Tees Toh);  Surgeon: Tonny Branch, MD;  Location: [facility];  Service: Ophthalmology;  Laterality: Left;  CDE: 6.99   CATARACT EXTRACTION W/PHACO Right 10/07/2014   Procedure: CATARACT EXTRACTION PHACO AND INTRAOCULAR LENS PLACEMENT RIGHT EYE;  Surgeon: Tonny Branch, MD;  Location: [facility];  Service: Ophthalmology;  Laterality: Right;  CDE:5.16   PACEMAKER INSERTION     SJM by JA   Family History  Problem Relation Age of Onset   Parkinson's disease Other    CVA Other    Parkinson's disease Father    Social History   Socioeconomic History   Marital status: Widowed    Spouse name: Not on file   Number of children: 1   Years of education: Not on file   Highest education level: High school graduate  Occupational History   Occupation: RETIRED    Employer: RETIRED  Tobacco Use   Smoking status: Former Smoker    Packs/day: 0.50    Years: 10.00    Pack years: 5.00    Types: Cigarettes    Quit date: 03/29/1978    Years since quitting: 41.5   Smokeless tobacco: Never Used  Vaping Use   Vaping Use: Never used  Substance and Sexual Activity   Alcohol use: No    Alcohol/week: 0.0 standard drinks   Drug use: No   Sexual activity: Not Currently    Birth control/protection: None  Other Topics Concern   Not on file  Social History Narrative   Not on file   Social Determinants of Health   Financial Resource Strain: Low Risk     Difficulty of Paying Living Expenses: Not hard at all  Food Insecurity: No Food Insecurity   Worried About Charity fundraiser in the Last Year: Never true   [place] in the Last Year: Never true  Transportation Needs: No Transportation Needs   Lack of Transportation (Medical): No   Lack of Transportation (Non-Medical): No  Physical Activity: Inactive   Days of Exercise per Week: 0 days   Minutes of Exercise per Session: 0 min  Stress: No Stress Concern Present   Feeling of Stress : Not at all  Social Connections: Unknown   Frequency of Communication with Friends and Family: More than three times a week   Frequency of Social Gatherings with Friends and Family: More than three times a week   Attends Religious Services: Not on [employer] of Clubs or  Organizations: Not on file   Attends Archivist Meetings: Not on file   Marital Status: Widowed    Activities of Daily Living In your present state of health, do you have any difficulty performing the following activities: 10/19/2019  Hearing? N  Vision? Y  Difficulty concentrating or making decisions? N  Walking or climbing stairs? Y  Dressing or bathing? N  Doing errands, shopping? Y  Preparing Food and eating ? Y  Using the Toilet? N  In the past six months, have you accidently leaked urine? N  Do you have problems with loss of bowel control? N  Managing your Medications? N  Managing your Finances? Y  Housekeeping or managing your Housekeeping? Y  Some recent data might be hidden    Patient Education/ Literacy How often do you need to have someone help you when you read instructions, pamphlets, or other written materials from your doctor or pharmacy?: 3 - Sometimes What is the last grade level you completed in school?: HS  Exercise Current Exercise Habits: The patient does not participate in regular exercise at present  Diet Patient reports consuming 2 meals a day and 2 snack(s) a day Patient  reports that her primary diet is: Regular Patient reports that she does have regular access to food.   Depression Screen PHQ 2/9 Scores 10/19/2019 09/12/2019 08/24/2019 01/16/2019 05/01/2018 11/24/2017 07/26/2017  PHQ - 2 Score 0 0 0 0 0 0 0     Fall Risk Fall Risk  10/19/2019 09/12/2019 08/24/2019 01/16/2019 05/01/2018  Falls in the past year? 0 0 0 0 0  Number falls in past yr: - - 0 - -  Injury with Fall? - - 0 - -  Risk Factor Category  - - - - -  Risk for fall due to : - - Impaired balance/gait;Impaired mobility - -  Follow up - Falls evaluation completed Falls evaluation completed - -     Objective:  Megan [name] seemed alert and oriented and she participated appropriately during our telephone visit.  Blood Pressure Weight BMI  BP Readings from Last 3 Encounters:  09/12/19 136/77  08/24/19 138/70  02/21/19 104/78   Wt Readings from Last 3 Encounters:  09/12/19 291 lb (132 kg)  08/24/19 291 lb 3.2 oz (132.1 kg)  02/21/19 275 lb (124.7 kg)   BMI Readings from Last 1 Encounters:  09/12/19 54.98 kg/m    *Unable to obtain current vital signs, weight, and BMI due to telephone visit type  Hearing/Vision  Inanna did not seem to have difficulty with hearing/understanding during the telephone conversation Reports that she has not had a formal eye exam by an eye care professional within the past year Reports that she has not had a formal hearing evaluation within the past year *Unable to fully assess hearing and vision during telephone visit type  Cognitive Function: 6CIT Screen 10/19/2019 10/18/2018  What Year? 0 points 0 points  What month? 0 points 0 points  What time? 0 points 0 points  Count back from 20 0 points 0 points  Months in reverse 0 points 0 points  Repeat phrase 0 points 0 points  Total Score 0 0   (Normal:0-7, Significant for Dysfunction: >8)  Normal Cognitive Function Screening: Yes   Immunization & Health Maintenance Record Immunization History   Administered Date(s) Administered   Influenza, High Dose Seasonal PF 01/26/2017, 01/12/2018   Influenza,inj,Quad PF,6+ Mos 01/01/2016, 12/28/2018   Pneumococcal Conjugate-13 12/22/2012, 01/12/2018   Pneumococcal Polysaccharide-23 12/06/2007  Td 09/13/2018    Health Maintenance  Topic Date Due   Hepatitis C Screening  Never done   COVID-19 Vaccine (1) Never done   COLONOSCOPY  Never done   PNA vac Low Risk Adult (2 of 2 - PPSV23) 01/13/2019   INFLUENZA VACCINE  10/28/2019   TETANUS/TDAP  09/12/2028   DEXA SCAN  Completed       Assessment  This is a routine wellness examination for Megan [name].  Health Maintenance: Due or Overdue Health Maintenance Due  Topic Date Due   Hepatitis C Screening  Never done   COVID-19 Vaccine (1) Never done   COLONOSCOPY  Never done   PNA vac Low Risk Adult (2 of 2 - PPSV23) 01/13/2019    Megan [name] does not need a referral for Community Assistance: Care Management:   no Social Work:    no Prescription Assistance:  no Nutrition/Diabetes Education:  no   Plan:  Personalized Goals Goals Addressed               This Visit's Progress     Patient Stated     "I would like to stay out of the hospital" (pt-stated)   On track     Current Barriers:  Knowledge Deficits related to respiratory disease process  Nurse Case Manager Clinical Goal(s):  Over the next 60 days, patient will work with Consulting civil engineer to determine what respiratory disease process she has and how best to manage it Over the next 6 months, patient will not experience any respiratory complications and will not require any ED visits or hospital admissions for management  Interventions:  Chart reviewed. Patient does not have a diagnosis of asthma, COPD, or any other chronic respiratory illness. Discussed current respiratory condition status and history Reports having "bronchitis" that usually requires ED visit or hospitalization  Always happens around the end  of March or April She uses albuterol several times a week and when having a flare she will use it several times a day She is not on a maintenance inhaler She does not have a neublizer Reviewed medications with patient and discussed albuterol Discussed plans with patient for ongoing care management follow up and provided patient with direct contact information for care management team Discussed insurance coverage. She does not have a prescription drug plan and pays out-of-pocket for all meds. She does utilize [employer], GoodRx, and [facility]'s discounted drug list  Patient Self Care Activities:  Performs ADL's independently Performs IADL's independently   Initial goal documentation       Other     Exercise 150 min/wk Moderate Activity   Not on track      Personalized Health Maintenance & Screening Recommendations    Lung Cancer Screening Recommended: no (Low Dose CT Chest recommended if Age 76-80 years, 30 pack-year currently smoking OR have quit w/in past 15 years) Hepatitis C Screening recommended: no HIV Screening recommended: no  Advanced Directives: Written information was not prepared per patient's request.  Referrals & Orders No orders of the defined types were placed in this encounter.   Follow-up Plan Follow-up with Sharion Balloon, FNP as planned Schedule 02/12/2020    I have personally reviewed and noted the following in the patient's chart:   Medical and social history Use of alcohol, tobacco or illicit drugs  Current medications and supplements Functional ability and status Nutritional status Physical activity Advanced directives List of other physicians Hospitalizations, surgeries, and ER visits in previous 12 months Vitals Screenings  to include cognitive, depression, and falls Referrals and appointments  In addition, I have reviewed and discussed with Megan [name] certain preventive protocols, quality metrics, and best practice  recommendations. A written personalized care plan for preventive services as well as general preventive health recommendations is available and can be mailed to the patient at her request.      Maud Deed [facility]  [id number]/6728   I have reviewed and agree with the above AWV documentation.   Evelina Dun, FNP

## 2019-10-19 NOTE — Patient Instructions (Addendum)
  [place] Maintenance Summary and Written Plan of Care  Ms. Aure ,  Thank you for allowing me to perform your Medicare Annual Wellness Visit and for your ongoing commitment to your health.   Health Maintenance & Immunization History Health Maintenance  Topic Date Due  . Hepatitis C Screening  Never done  . COVID-19 Vaccine (1) Never done  . COLONOSCOPY  Never done  . PNA vac Low Risk Adult (2 of 2 - PPSV23) 01/13/2019  . INFLUENZA VACCINE  10/28/2019  . TETANUS/TDAP  09/12/2028  . DEXA SCAN  Completed   Immunization History  Administered Date(s) Administered  . Influenza, High Dose Seasonal PF 01/26/2017, 01/12/2018  . Influenza,inj,Quad PF,6+ Mos 01/01/2016, 12/28/2018  . Pneumococcal Conjugate-13 12/22/2012, 01/12/2018  . Pneumococcal Polysaccharide-23 12/06/2007  . Td 09/13/2018    These are the patient goals that we discussed: Goals Addressed              This Visit's Progress     Patient Stated   .  "I would like to stay out of the hospital" (pt-stated)   On track     Current Barriers:  Marland Kitchen Knowledge Deficits related to respiratory disease process  Nurse Case Manager Clinical Goal(s):  Marland Kitchen Over the next 60 days, patient will work with Consulting civil engineer to determine what respiratory disease process she has and how best to manage it . Over the next 6 months, patient will not experience any respiratory complications and will not require any ED visits or hospital admissions for management  Interventions:  . Chart reviewed. Patient does not have a diagnosis of asthma, COPD, or any other chronic respiratory illness. . Discussed current respiratory condition status and history o Reports having "bronchitis" that usually requires ED visit or hospitalization  o Always happens around the end of March or April o She uses albuterol several times a week and when having a flare she will use it several times a day o She is not on a maintenance  inhaler o She does not have a neublizer . Reviewed medications with patient and discussed albuterol . Discussed plans with patient for ongoing care management follow up and provided patient with direct contact information for care management team . Discussed insurance coverage. She does not have a prescription drug plan and pays out-of-pocket for all meds. She does utilize [employer], GoodRx, and [facility]'s discounted drug list  Patient Self Care Activities:  . Performs ADL's independently . Performs IADL's independently   Initial goal documentation       Other   .  Exercise 150 min/wk Moderate Activity   Not on track       This is a list of Health Maintenance Items that are overdue or due now: Health Maintenance Due  Topic Date Due  . Hepatitis C Screening  Never done  . COVID-19 Vaccine (1) Never done  . COLONOSCOPY  Never done  . PNA vac Low Risk Adult (2 of 2 - PPSV23) 01/13/2019     Orders/Referrals Placed Today: No orders of the defined types were placed in this encounter.  (Contact our referral department at [phone] if you have not spoken with someone about your referral appointment within the next 5 days)    Follow-up Plan  Scheduled with Evelina Dun, FNP on 02/12/2020 at 8:40am

## 2019-10-23 ENCOUNTER — Ambulatory Visit (INDEPENDENT_AMBULATORY_CARE_PROVIDER_SITE_OTHER): Payer: Medicare Other | Admitting: *Deleted

## 2019-10-23 DIAGNOSIS — I4891 Unspecified atrial fibrillation: Secondary | ICD-10-CM

## 2019-10-23 DIAGNOSIS — Z5181 Encounter for therapeutic drug level monitoring: Secondary | ICD-10-CM

## 2019-10-23 LAB — POCT INR: INR: 2.8 (ref 2.0–3.0)

## 2019-10-23 NOTE — Patient Instructions (Signed)
Continue warfarin 1 tablet daily Eat extra greens today or tomorrow Recheck in 3 weeks

## 2019-11-15 ENCOUNTER — Telehealth: Payer: Self-pay | Admitting: Family

## 2019-11-15 NOTE — Telephone Encounter (Signed)
  Prescription Request  11/15/2019  What is the name of the medication or equipment? Nexium 40 mg   Have you contacted your pharmacy to request a refill? (if applicable) No  Which pharmacy would you like this sent to? [facility]   Patient notified that their request is being sent to the clinical staff for review and that they should receive a response within 2 business days.   Lenna Gilford' pt.  Please call pt.

## 2019-11-16 MED ORDER — ESOMEPRAZOLE MAGNESIUM 40 MG PO CPDR
40.0000 mg | DELAYED_RELEASE_CAPSULE | Freq: Every day | ORAL | 1 refills | Status: DC
Start: 2019-11-16 — End: 2020-02-12

## 2019-11-16 NOTE — Telephone Encounter (Signed)
Pt aware.

## 2019-11-16 NOTE — Telephone Encounter (Signed)
Patient called requesting nexium but I do not see it on patients current med list. Please review and advise

## 2019-11-16 NOTE — Telephone Encounter (Signed)
Prescription sent to pharmacy.

## 2019-11-20 ENCOUNTER — Ambulatory Visit (INDEPENDENT_AMBULATORY_CARE_PROVIDER_SITE_OTHER): Payer: Medicare Other | Admitting: *Deleted

## 2019-11-20 DIAGNOSIS — I4891 Unspecified atrial fibrillation: Secondary | ICD-10-CM

## 2019-11-20 DIAGNOSIS — Z5181 Encounter for therapeutic drug level monitoring: Secondary | ICD-10-CM | POA: Diagnosis not present

## 2019-11-20 LAB — POCT INR: INR: 2.2 (ref 2.0–3.0)

## 2019-11-20 NOTE — Patient Instructions (Signed)
Continue warfarin 1 tablet daily Continue greens Recheck in 4 weeks 

## 2019-12-18 ENCOUNTER — Ambulatory Visit (INDEPENDENT_AMBULATORY_CARE_PROVIDER_SITE_OTHER): Payer: Medicare Other | Admitting: *Deleted

## 2019-12-18 DIAGNOSIS — Z5181 Encounter for therapeutic drug level monitoring: Secondary | ICD-10-CM | POA: Diagnosis not present

## 2019-12-18 DIAGNOSIS — I4891 Unspecified atrial fibrillation: Secondary | ICD-10-CM

## 2019-12-18 DIAGNOSIS — Z23 Encounter for immunization: Secondary | ICD-10-CM | POA: Diagnosis not present

## 2019-12-18 LAB — POCT INR: INR: 2.2 (ref 2.0–3.0)

## 2019-12-18 NOTE — Patient Instructions (Signed)
Continue warfarin 1 tablet daily Continue greens Recheck in 4 weeks 

## 2019-12-20 DIAGNOSIS — E1151 Type 2 diabetes mellitus with diabetic peripheral angiopathy without gangrene: Secondary | ICD-10-CM | POA: Diagnosis not present

## 2019-12-20 DIAGNOSIS — E1140 Type 2 diabetes mellitus with diabetic neuropathy, unspecified: Secondary | ICD-10-CM | POA: Diagnosis not present

## 2019-12-26 ENCOUNTER — Telehealth: Payer: Self-pay | Admitting: *Deleted

## 2019-12-26 NOTE — Telephone Encounter (Signed)
Spoke with pt.  Says she is bruising on extremities. Would like to have INR checked.  Appointment made for tomorrow 9/30 at 9:15am.  She verbalized understanding.

## 2019-12-26 NOTE — Telephone Encounter (Signed)
Patient thinks she took to much of her coumadin  Please call [phone]

## 2019-12-27 ENCOUNTER — Ambulatory Visit (INDEPENDENT_AMBULATORY_CARE_PROVIDER_SITE_OTHER): Payer: Medicare Other | Admitting: *Deleted

## 2019-12-27 DIAGNOSIS — I4891 Unspecified atrial fibrillation: Secondary | ICD-10-CM | POA: Diagnosis not present

## 2019-12-27 DIAGNOSIS — Z5181 Encounter for therapeutic drug level monitoring: Secondary | ICD-10-CM

## 2019-12-27 LAB — POCT INR: INR: 1.7 — AB (ref 2.0–3.0)

## 2019-12-27 MED ORDER — WARFARIN SODIUM 3 MG PO TABS: ORAL_TABLET | ORAL | 6 refills | Status: DC

## 2019-12-27 NOTE — Patient Instructions (Signed)
Take 2 tablets tonight then resume 1 tablet daily Continue greens Recheck in 3 weeks

## 2020-01-10 ENCOUNTER — Other Ambulatory Visit: Payer: Self-pay | Admitting: *Deleted

## 2020-01-10 MED ORDER — CLOTRIMAZOLE-BETAMETHASONE 1-0.05 % EX CREA: 1.0000 "application " | TOPICAL_CREAM | Freq: Two times a day (BID) | CUTANEOUS | 1 refills | Status: DC

## 2020-01-15 ENCOUNTER — Ambulatory Visit (INDEPENDENT_AMBULATORY_CARE_PROVIDER_SITE_OTHER): Payer: Medicare Other | Admitting: *Deleted

## 2020-01-15 ENCOUNTER — Other Ambulatory Visit: Payer: Self-pay | Admitting: Family

## 2020-01-15 DIAGNOSIS — Z5181 Encounter for therapeutic drug level monitoring: Secondary | ICD-10-CM | POA: Diagnosis not present

## 2020-01-15 DIAGNOSIS — L03116 Cellulitis of left lower limb: Secondary | ICD-10-CM

## 2020-01-15 DIAGNOSIS — I4891 Unspecified atrial fibrillation: Secondary | ICD-10-CM | POA: Diagnosis not present

## 2020-01-15 LAB — POCT INR: INR: 2.2 (ref 2.0–3.0)

## 2020-01-15 NOTE — Patient Instructions (Signed)
Continue warfarin 1 tablet daily Continue greens Recheck in 3 weeks

## 2020-01-16 ENCOUNTER — Other Ambulatory Visit: Payer: Self-pay | Admitting: Family

## 2020-01-16 DIAGNOSIS — L03116 Cellulitis of left lower limb: Secondary | ICD-10-CM

## 2020-01-25 ENCOUNTER — Other Ambulatory Visit: Payer: Self-pay | Admitting: Family

## 2020-01-25 DIAGNOSIS — I4891 Unspecified atrial fibrillation: Secondary | ICD-10-CM

## 2020-02-07 ENCOUNTER — Ambulatory Visit (INDEPENDENT_AMBULATORY_CARE_PROVIDER_SITE_OTHER): Payer: Medicare Other | Admitting: *Deleted

## 2020-02-07 DIAGNOSIS — Z5181 Encounter for therapeutic drug level monitoring: Secondary | ICD-10-CM | POA: Diagnosis not present

## 2020-02-07 DIAGNOSIS — I4891 Unspecified atrial fibrillation: Secondary | ICD-10-CM

## 2020-02-07 LAB — POCT INR: INR: 3.4 — AB (ref 2.0–3.0)

## 2020-02-07 NOTE — Patient Instructions (Signed)
Hold warfarin tonight then continue 1 tablet daily Continue greens Recheck in 4 weeks

## 2020-02-08 ENCOUNTER — Telehealth: Payer: Self-pay

## 2020-02-08 NOTE — Telephone Encounter (Signed)
Patient aware she has not had lab work in awhile so she will have to get lab work.

## 2020-02-12 ENCOUNTER — Encounter: Payer: Self-pay | Admitting: Family

## 2020-02-12 ENCOUNTER — Other Ambulatory Visit: Payer: Self-pay

## 2020-02-12 ENCOUNTER — Ambulatory Visit (INDEPENDENT_AMBULATORY_CARE_PROVIDER_SITE_OTHER): Payer: Medicare Other | Admitting: Family

## 2020-02-12 VITALS — BP 144/70 | HR 71 | Temp 97.0°F | Ht 61.0 in

## 2020-02-12 DIAGNOSIS — I4891 Unspecified atrial fibrillation: Secondary | ICD-10-CM

## 2020-02-12 DIAGNOSIS — I10 Essential (primary) hypertension: Secondary | ICD-10-CM

## 2020-02-12 DIAGNOSIS — Z1159 Encounter for screening for other viral diseases: Secondary | ICD-10-CM | POA: Diagnosis not present

## 2020-02-12 DIAGNOSIS — Z7901 Long term (current) use of anticoagulants: Secondary | ICD-10-CM | POA: Diagnosis not present

## 2020-02-12 DIAGNOSIS — E039 Hypothyroidism, unspecified: Secondary | ICD-10-CM | POA: Diagnosis not present

## 2020-02-12 DIAGNOSIS — Z23 Encounter for immunization: Secondary | ICD-10-CM

## 2020-02-12 DIAGNOSIS — I5032 Chronic diastolic (congestive) heart failure: Secondary | ICD-10-CM

## 2020-02-12 DIAGNOSIS — I739 Peripheral vascular disease, unspecified: Secondary | ICD-10-CM | POA: Diagnosis not present

## 2020-02-12 DIAGNOSIS — N3281 Overactive bladder: Secondary | ICD-10-CM | POA: Diagnosis not present

## 2020-02-12 DIAGNOSIS — R7303 Prediabetes: Secondary | ICD-10-CM | POA: Diagnosis not present

## 2020-02-12 DIAGNOSIS — K219 Gastro-esophageal reflux disease without esophagitis: Secondary | ICD-10-CM | POA: Diagnosis not present

## 2020-02-12 DIAGNOSIS — E782 Mixed hyperlipidemia: Secondary | ICD-10-CM | POA: Diagnosis not present

## 2020-02-12 LAB — LIPID PANEL

## 2020-02-12 LAB — BAYER DCA HB A1C WAIVED: HB A1C (BAYER DCA - WAIVED): 6.2 % (ref <7.0)

## 2020-02-12 MED ORDER — ATORVASTATIN CALCIUM 80 MG PO TABS: 80.0000 mg | ORAL_TABLET | Freq: Every day | ORAL | 1 refills | Status: DC

## 2020-02-12 MED ORDER — METOPROLOL TARTRATE 100 MG PO TABS: ORAL_TABLET | ORAL | 2 refills | Status: DC

## 2020-02-12 MED ORDER — ESOMEPRAZOLE MAGNESIUM 40 MG PO CPDR: 40.0000 mg | DELAYED_RELEASE_CAPSULE | Freq: Every day | ORAL | 1 refills | Status: DC

## 2020-02-12 MED ORDER — DILTIAZEM HCL ER COATED BEADS 240 MG PO CP24: 240.0000 mg | ORAL_CAPSULE | Freq: Every day | ORAL | 2 refills | Status: DC

## 2020-02-12 MED ORDER — LEVOTHYROXINE SODIUM 150 MCG PO TABS: 150.0000 ug | ORAL_TABLET | Freq: Every day | ORAL | 1 refills | Status: DC

## 2020-02-12 NOTE — Patient Instructions (Signed)
Health Maintenance After Age 75 After age 75, you are at a higher risk for certain long-term diseases and infections as well as injuries from falls. Falls are a major cause of broken bones and head injuries in people who are older than age 75. Getting regular preventive care can help to keep you healthy and well. Preventive care includes getting regular testing and making lifestyle changes as recommended by your health care provider. Talk with your health care provider about:  Which screenings and tests you should have. A screening is a test that checks for a disease when you have no symptoms.  A diet and exercise plan that is right for you. What should I know about screenings and tests to prevent falls? Screening and testing are the best ways to find a health problem early. Early diagnosis and treatment give you the best chance of managing medical conditions that are common after age 75. Certain conditions and lifestyle choices may make you more likely to have a fall. Your health care provider may recommend:  Regular vision checks. Poor vision and conditions such as cataracts can make you more likely to have a fall. If you wear glasses, make sure to get your prescription updated if your vision changes.  Medicine review. Work with your health care provider to regularly review all of the medicines you are taking, including over-the-counter medicines. Ask your health care provider about any side effects that may make you more likely to have a fall. Tell your health care provider if any medicines that you take make you feel dizzy or sleepy.  Osteoporosis screening. Osteoporosis is a condition that causes the bones to get weaker. This can make the bones weak and cause them to break more easily.  Blood pressure screening. Blood pressure changes and medicines to control blood pressure can make you feel dizzy.  Strength and balance checks. Your health care provider may recommend certain tests to check your  strength and balance while standing, walking, or changing positions.  Foot health exam. Foot pain and numbness, as well as not wearing proper footwear, can make you more likely to have a fall.  Depression screening. You may be more likely to have a fall if you have a fear of falling, feel emotionally low, or feel unable to do activities that you used to do.  Alcohol use screening. Using too much alcohol can affect your balance and may make you more likely to have a fall. What actions can I take to lower my risk of falls? General instructions  Talk with your health care provider about your risks for falling. Tell your health care provider if: ? You fall. Be sure to tell your health care provider about all falls, even ones that seem minor. ? You feel dizzy, sleepy, or off-balance.  Take over-the-counter and prescription medicines only as told by your health care provider. These include any supplements.  Eat a healthy diet and maintain a healthy weight. A healthy diet includes low-fat dairy products, low-fat (lean) meats, and fiber from whole grains, beans, and lots of fruits and vegetables. Home safety  Remove any tripping hazards, such as rugs, cords, and clutter.  Install safety equipment such as grab bars in bathrooms and safety rails on stairs.  Keep rooms and walkways well-lit. Activity   Follow a regular exercise program to stay fit. This will help you maintain your balance. Ask your health care provider what types of exercise are appropriate for you.  If you need a cane or   walker, use it as recommended by your health care provider.  Wear supportive shoes that have nonskid soles. Lifestyle  Do not drink alcohol if your health care provider tells you not to drink.  If you drink alcohol, limit how much you have: ? 0-1 drink a day for women. ? 0-2 drinks a day for men.  Be aware of how much alcohol is in your drink. In the [place] one drink equals one typical bottle of beer (12  oz), one-half glass of wine (5 oz), or one shot of hard liquor (1 oz).  Do not use any products that contain nicotine or tobacco, such as cigarettes and e-cigarettes. If you need help quitting, ask your health care provider. Summary  Having a healthy lifestyle and getting preventive care can help to protect your health and wellness after age 75.  Screening and testing are the best way to find a health problem early and help you avoid having a fall. Early diagnosis and treatment give you the best chance for managing medical conditions that are more common for people who are older than age 75.  Falls are a major cause of broken bones and head injuries in people who are older than age 75. Take precautions to prevent a fall at home.  Work with your health care provider to learn what changes you can make to improve your health and wellness and to prevent falls. This information is not intended to replace advice given to you by your health care provider. Make sure you discuss any questions you have with your health care provider. Document Revised: 07/06/2018 Document Reviewed: 01/26/2017 Elsevier Patient Education  2020 [employer]  

## 2020-02-12 NOTE — Addendum Note (Signed)
Addended by: Brynda Peon F on: 02/12/2020 02:12 PM   Modules accepted: Orders

## 2020-02-12 NOTE — Progress Notes (Signed)
Subjective:    Patient ID: Megan [name], female    DOB: Dec 27, 1945, 74 y.o.   MRN: [id number]  Chief Complaint  Patient presents with  . Medical Management of Chronic Issues   Pt presents to the office today to establish care with me. Pt is followed by Cardiologists annually for pacemaker, CHF, A Fib. She also goes to the coumadin clinic once a month.  Hypertension This is a chronic problem. The current episode started more than 1 year ago. The problem has been resolved since onset. The problem is uncontrolled. Associated symptoms include malaise/fatigue and peripheral edema. Pertinent negatives include no blurred vision, palpitations or shortness of breath. Risk factors for coronary artery disease include dyslipidemia, diabetes mellitus, obesity and sedentary lifestyle. The current treatment provides moderate improvement. Hypertensive end-organ damage includes CAD/MI and heart failure. Identifiable causes of hypertension include a thyroid problem.  Congestive Heart Failure Presents for follow-up visit. Associated symptoms include edema and fatigue. Pertinent negatives include no palpitations or shortness of breath. The symptoms have been stable.  Thyroid Problem Presents for follow-up visit. Symptoms include dry skin, fatigue and leg swelling. Patient reports no diarrhea or palpitations. The symptoms have been stable. Her past medical history is significant for heart failure and hyperlipidemia.  Hyperlipidemia This is a chronic problem. The current episode started more than 1 year ago. The problem is uncontrolled. Recent lipid tests were reviewed and are high. Exacerbating diseases include obesity. Pertinent negatives include no shortness of breath. Current antihyperlipidemic treatment includes statins. The current treatment provides moderate improvement of lipids. Risk factors for coronary artery disease include dyslipidemia, diabetes mellitus, hypertension, a sedentary lifestyle and  post-menopausal.  Urinary Frequency  This is a chronic problem. The current episode started more than 1 year ago. The problem occurs intermittently. The problem has been waxing and waning. Associated symptoms include frequency.  Gastroesophageal Reflux She complains of belching and heartburn. This is a chronic problem. The current episode started more than 1 year ago. The problem occurs occasionally. Associated symptoms include fatigue. She has tried a PPI for the symptoms. The treatment provided moderate relief.  Diabetes She presents for her initial diabetic visit. She has type 2 diabetes mellitus. There are no hypoglycemic associated symptoms. Pertinent negatives for hypoglycemia include no confusion. Associated symptoms include fatigue. Pertinent negatives for diabetes include no blurred vision. There are no hypoglycemic complications. Diabetic complications include heart disease and peripheral neuropathy. Risk factors for coronary artery disease include dyslipidemia, hypertension, sedentary lifestyle and post-menopausal. She is following a generally unhealthy diet. Her overall blood glucose range is 110-130 mg/dl.      Review of Systems  Constitutional: Positive for fatigue and malaise/fatigue.  Eyes: Negative for blurred vision.  Respiratory: Negative for shortness of breath.   Cardiovascular: Negative for palpitations.  Gastrointestinal: Positive for heartburn. Negative for diarrhea.  Genitourinary: Positive for frequency.  Psychiatric/Behavioral: Negative for confusion.  All other systems reviewed and are negative.      Objective:   Physical Exam Vitals reviewed.  Constitutional:      General: She is not in acute distress.    Appearance: She is well-developed. She is obese.  HENT:     Head: Normocephalic and atraumatic.     Right Ear: Tympanic membrane normal.     Left Ear: Tympanic membrane normal.  Eyes:     Pupils: Pupils are equal, round, and reactive to light.  Neck:       Thyroid: No thyromegaly.  Cardiovascular:     Rate  and Rhythm: Normal rate and regular rhythm.     Heart sounds: Normal heart sounds. No murmur heard.   Pulmonary:     Effort: Pulmonary effort is normal. No respiratory distress.     Breath sounds: Normal breath sounds. No wheezing.  Abdominal:     General: Bowel sounds are normal. There is no distension.     Palpations: Abdomen is soft.     Tenderness: There is no abdominal tenderness.  Musculoskeletal:        General: No tenderness. Normal range of motion.     Cervical back: Normal range of motion and neck supple.  Skin:    General: Skin is warm and dry.  Neurological:     Mental Status: She is alert and oriented to person, place, and time.     Cranial Nerves: No cranial nerve deficit.     Motor: Weakness present.     Deep Tendon Reflexes: Reflexes are normal and symmetric.     Comments: Pt in wheelchair, generalized weakness,  Psychiatric:        Behavior: Behavior normal.        Thought Content: Thought content normal.        Judgment: Judgment normal.       BP (!) 144/70   Pulse 71   Temp (!) 97 F (36.1 C) (Temporal)   Ht 5' 1" (1.549 m)   SpO2 98%   BMI 54.98 kg/m      Assessment & Plan:  Megan [name] comes in today with chief complaint of Medical Management of Chronic Issues   Diagnosis and orders addressed:  1. Acquired hypothyroidism - levothyroxine (SYNTHROID) 150 MCG tablet; Take 1 tablet (150 mcg total) by mouth daily before breakfast.  Dispense: 90 tablet; Refill: 1 - CMP14+EGFR - CBC with Differential/Platelet - TSH  2. Atrial fibrillation, unspecified type (HCC) - diltiazem (CARDIZEM CD) 240 MG 24 hr capsule; Take 1 capsule (240 mg total) by mouth daily.  Dispense: 90 capsule; Refill: 2 - CMP14+EGFR - CBC with Differential/Platelet  3. Mixed hyperlipidemia - atorvastatin (LIPITOR) 80 MG tablet; Take 1 tablet (80 mg total) by mouth daily.  Dispense: 90 tablet; Refill: 1 -  CMP14+EGFR - CBC with Differential/Platelet - Lipid panel  4. Chronic diastolic congestive heart failure (HCC) - CMP14+EGFR - CBC with Differential/Platelet  5. HYPERTENSION, BENIGN - CMP14+EGFR - CBC with Differential/Platelet  6. PVD (peripheral vascular disease) with claudication (HCC) - CMP14+EGFR - CBC with Differential/Platelet  7. Gastroesophageal reflux disease without esophagitis - CMP14+EGFR - CBC with Differential/Platelet  8. Morbid obesity due to excess calories (HCC) - CMP14+EGFR - CBC with Differential/Platelet  9. Long term (current) use of anticoagulants - CMP14+EGFR - CBC with Differential/Platelet  10. OAB (overactive bladder) - CMP14+EGFR - CBC with Differential/Platelet  11. Prediabetes - Bayer DCA Hb A1c Waived - CMP14+EGFR - CBC with Differential/Platelet - Microalbumin / creatinine urine ratio  12. Need for hepatitis C screening test - CMP14+EGFR - CBC with Differential/Platelet - Hepatitis C antibody   Labs pending Health Maintenance reviewed- Requested colonoscopy from [place] and exercise encouraged  Follow up plan: 6 months    Evelina Dun, FNP

## 2020-02-12 NOTE — Addendum Note (Signed)
Addended by: Brynda Peon F on: 02/12/2020 09:50 AM   Modules accepted: Orders

## 2020-02-13 LAB — CMP14+EGFR
ALT: 18 IU/L (ref 0–32)
AST: 16 IU/L (ref 0–40)
Albumin/Globulin Ratio: 1.3 (ref 1.2–2.2)
Albumin: 4 g/dL (ref 3.7–4.7)
Alkaline Phosphatase: 188 IU/L — ABNORMAL HIGH (ref 44–121)
BUN/Creatinine Ratio: 19 (ref 12–28)
BUN: 19 mg/dL (ref 8–27)
Bilirubin Total: 1 mg/dL (ref 0.0–1.2)
CO2: 24 mmol/L (ref 20–29)
Calcium: 9.6 mg/dL (ref 8.7–10.3)
Chloride: 102 mmol/L (ref 96–106)
Creatinine, Ser: 1.02 mg/dL — ABNORMAL HIGH (ref 0.57–1.00)
GFR calc Af Amer: 62 mL/min/{1.73_m2} (ref >59)
GFR calc non Af Amer: 54 mL/min/{1.73_m2} — ABNORMAL LOW (ref >59)
Globulin, Total: 3.2 g/dL (ref 1.5–4.5)
Glucose: 145 mg/dL — ABNORMAL HIGH (ref 65–99)
Potassium: 4.3 mmol/L (ref 3.5–5.2)
Sodium: 139 mmol/L (ref 134–144)
Total Protein: 7.2 g/dL (ref 6.0–8.5)

## 2020-02-13 LAB — CBC WITH DIFFERENTIAL/PLATELET
Basophils Absolute: 0.1 10*3/uL (ref 0.0–0.2)
Basos: 1 %
EOS (ABSOLUTE): 0.1 10*3/uL (ref 0.0–0.4)
Eos: 1 %
Hematocrit: 46.5 % (ref 34.0–46.6)
Hemoglobin: 15.8 g/dL (ref 11.1–15.9)
Immature Grans (Abs): 0.1 10*3/uL (ref 0.0–0.1)
Immature Granulocytes: 1 %
Lymphocytes Absolute: 1.5 10*3/uL (ref 0.7–3.1)
Lymphs: 13 %
MCH: 31 pg (ref 26.6–33.0)
MCHC: 34 g/dL (ref 31.5–35.7)
MCV: 91 fL (ref 79–97)
Monocytes Absolute: 0.8 10*3/uL (ref 0.1–0.9)
Monocytes: 7 %
Neutrophils Absolute: 9.2 10*3/uL — ABNORMAL HIGH (ref 1.4–7.0)
Neutrophils: 77 %
Platelets: 277 10*3/uL (ref 150–450)
RBC: 5.09 x10E6/uL (ref 3.77–5.28)
RDW: 13.2 % (ref 11.7–15.4)
WBC: 11.7 10*3/uL — ABNORMAL HIGH (ref 3.4–10.8)

## 2020-02-13 LAB — LIPID PANEL
Chol/HDL Ratio: 4.3 ratio (ref 0.0–4.4)
Cholesterol, Total: 220 mg/dL — ABNORMAL HIGH (ref 100–199)
HDL: 51 mg/dL (ref >39)
LDL Chol Calc (NIH): 142 mg/dL — ABNORMAL HIGH (ref 0–99)
Triglycerides: 150 mg/dL — ABNORMAL HIGH (ref 0–149)
VLDL Cholesterol Cal: 27 mg/dL (ref 5–40)

## 2020-02-13 LAB — TSH: TSH: 3.39 u[IU]/mL (ref 0.450–4.500)

## 2020-02-13 LAB — HEPATITIS C ANTIBODY: Hep C Virus Ab: <0.1 s/co ratio (ref 0.0–0.9)

## 2020-02-29 ENCOUNTER — Encounter: Payer: Medicare Other | Admitting: Internal Medicine

## 2020-03-06 ENCOUNTER — Ambulatory Visit (INDEPENDENT_AMBULATORY_CARE_PROVIDER_SITE_OTHER): Payer: Medicare Other | Admitting: *Deleted

## 2020-03-06 DIAGNOSIS — Z5181 Encounter for therapeutic drug level monitoring: Secondary | ICD-10-CM

## 2020-03-06 DIAGNOSIS — I4891 Unspecified atrial fibrillation: Secondary | ICD-10-CM

## 2020-03-06 LAB — POCT INR: INR: 2.4 (ref 2.0–3.0)

## 2020-03-06 NOTE — Patient Instructions (Signed)
Continue warfarin 1 tablet daily Continue greens Recheck in 4 weeks 

## 2020-03-14 ENCOUNTER — Encounter: Payer: Self-pay | Admitting: Internal Medicine

## 2020-03-14 ENCOUNTER — Ambulatory Visit (INDEPENDENT_AMBULATORY_CARE_PROVIDER_SITE_OTHER): Payer: Medicare Other | Admitting: Internal Medicine

## 2020-03-14 VITALS — BP 110/56 | HR 84 | Resp 16 | Ht 62.0 in | Wt 290.0 lb

## 2020-03-14 DIAGNOSIS — I4891 Unspecified atrial fibrillation: Secondary | ICD-10-CM | POA: Diagnosis not present

## 2020-03-14 DIAGNOSIS — I441 Atrioventricular block, second degree: Secondary | ICD-10-CM | POA: Diagnosis not present

## 2020-03-14 DIAGNOSIS — I10 Essential (primary) hypertension: Secondary | ICD-10-CM

## 2020-03-14 NOTE — Progress Notes (Signed)
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  Since last being seen in our clinic, the patient reports doing very well.  Today, she denies symptoms of palpitations, chest pain, shortness of breath,  lower extremity edema, dizziness, presyncope, or syncope.  The patient is otherwise without complaint today.   Past Medical History:  Diagnosis Date  . Acute diastolic heart failure ([place])   . Anxiety state, unspecified   . Congestive heart failure, unspecified   . Degenerative joint disease   . GERD (gastroesophageal reflux disease)   . Hyperlipidemia   . Hypertension   . Lymphedema   . Obesity   . OSA (obstructive sleep apnea) 06/09/2015  . Persistent atrial fibrillation (HCC)     post-termination pauses with afib s/p PPM  . Presence of permanent cardiac pacemaker   . Sinoatrial node dysfunction (HCC)    s/p PPM  . Sleep apnea    compliant with CPAP  . Unspecified hypothyroidism   . Unspecified venous (peripheral) insufficiency    Past Surgical History:  Procedure Laterality Date  . CATARACT EXTRACTION W/PHACO Left 09/09/2014   Procedure: CATARACT EXTRACTION PHACO AND INTRAOCULAR LENS PLACEMENT (IOC);  Surgeon: Tonny Branch, MD;  Location: [facility];  Service: Ophthalmology;  Laterality: Left;  CDE: 6.99  . CATARACT EXTRACTION W/PHACO Right 10/07/2014   Procedure: CATARACT EXTRACTION PHACO AND INTRAOCULAR LENS PLACEMENT RIGHT EYE;  Surgeon: Tonny Branch, MD;  Location: [facility];  Service: Ophthalmology;  Laterality: Right;  CDE:5.16  . PACEMAKER INSERTION     SJM by JA    ROS- all systems are reviewed and negative except as per HPI above  Current Outpatient Medications  Medication Sig Dispense Refill  . albuterol (VENTOLIN HFA) 108 (90 Base) MCG/ACT inhaler Inhale 2 puffs into the lungs every 6 (six) hours as needed for wheezing or shortness of breath. 18 g 1  . atorvastatin  (LIPITOR) 80 MG tablet Take 1 tablet (80 mg total) by mouth daily. 90 tablet 1  . clotrimazole-betamethasone (LOTRISONE) cream Apply 1 application topically 2 (two) times daily. 45 g 1  . diltiazem (CARDIZEM CD) 240 MG 24 hr capsule Take 1 capsule (240 mg total) by mouth daily. 90 capsule 2  . esomeprazole (NEXIUM) 40 MG capsule Take 1 capsule (40 mg total) by mouth daily. 90 capsule 1  . levothyroxine (SYNTHROID) 150 MCG tablet Take 1 tablet (150 mcg total) by mouth daily before breakfast. 90 tablet 1  . metoprolol tartrate (LOPRESSOR) 100 MG tablet Take 1/2 (one-half) tablet by mouth twice daily 90 tablet 2  . metroNIDAZOLE (METROGEL) 1 % gel Apply topically daily. 45 g 0  . nystatin (MYCOSTATIN/NYSTOP) powder Apply topically 4 (four) times daily. 15 g 0  . Vitamin D, Ergocalciferol, (DRISDOL) 1.25 MG (50000 UNIT) CAPS capsule Take 1 capsule (50,000 Units total) by mouth every 7 (seven) days. 12 capsule 1  . warfarin (COUMADIN) 3 MG tablet Take 3 mg by mouth daily.     No current facility-administered medications for this visit.    Physical Exam: Vitals:   03/14/20 1204  BP: (!) 110/56  Pulse: 84  Resp: 16  SpO2: 97%  Weight: 290 lb (131.5 kg)  Height: 5\' 2"  (1.575 m)    GEN- The patient is morbidly obese appearing, alert and oriented x 3 today.   Head- normocephalic, atraumatic Eyes-  Sclera clear, conjunctiva pink Ears- hearing  intact Oropharynx- clear Lungs-   normal work of breathing Chest- pacemaker pocket is well healed Heart- irregular rate and rhythm  GI- soft  Extremities- no clubbing, cyanosis, + edema  Pacemaker interrogation- reviewed in detail today,  See PACEART report  ekg tracing ordered today is personally reviewed and shows afib with demand V pacing  Assessment and Plan:  1. Symptomatic second degree heart block Normal pacemaker function See Pace Art report No changes today she is not device dependant today She declines remote monitoring  2.  Permanent afib Rate controlled On coumadin  3. HTN Stable No change required today  4. Obesity Body mass index is 53.04 kg/m. Lifestyle modification again advised today  Risks, benefits and potential toxicities for medications prescribed and/or refilled reviewed with patient today.   Return in a year  Thompson Grayer MD, [facility] 03/14/2020 12:10 PM

## 2020-03-14 NOTE — Patient Instructions (Addendum)
Medication Instructions:   Your physician recommends that you continue on your current medications as directed. Please refer to the Current Medication list given to you today.  Labwork:  None  Testing/Procedures:  None  Follow-Up:  Your physician recommends that you schedule a follow-up appointment in: 1 year.  Any Other Special Instructions Will Be Listed Below (If Applicable).  If you need a refill on your cardiac medications before your next appointment, please call your pharmacy. 

## 2020-04-03 ENCOUNTER — Ambulatory Visit (INDEPENDENT_AMBULATORY_CARE_PROVIDER_SITE_OTHER): Payer: Medicare Other | Admitting: *Deleted

## 2020-04-03 DIAGNOSIS — Z5181 Encounter for therapeutic drug level monitoring: Secondary | ICD-10-CM

## 2020-04-03 DIAGNOSIS — I4891 Unspecified atrial fibrillation: Secondary | ICD-10-CM | POA: Diagnosis not present

## 2020-04-03 LAB — POCT INR: INR: 2.1 (ref 2.0–3.0)

## 2020-04-03 NOTE — Patient Instructions (Signed)
Continue warfarin 1 tablet daily Continue greens Recheck in 4 weeks 

## 2020-04-09 ENCOUNTER — Telehealth: Payer: Self-pay

## 2020-04-09 NOTE — Telephone Encounter (Signed)
Per protocol needs a televisit or mychart video visit please schedule.

## 2020-04-09 NOTE — Telephone Encounter (Signed)
Pt called stating that she needs Christy to send her in a Rx for an antibiotic because she believes she has a UTI and Bronchitis.   Pt did not want to schedule an appt because she says she believes she has COVID and doesn't want to come out of the house.  Please advise and call patient.

## 2020-04-10 ENCOUNTER — Encounter: Payer: Self-pay | Admitting: Family

## 2020-04-10 ENCOUNTER — Ambulatory Visit (INDEPENDENT_AMBULATORY_CARE_PROVIDER_SITE_OTHER): Payer: Medicare Other | Admitting: Family

## 2020-04-10 DIAGNOSIS — R399 Unspecified symptoms and signs involving the genitourinary system: Secondary | ICD-10-CM

## 2020-04-10 DIAGNOSIS — B9689 Other specified bacterial agents as the cause of diseases classified elsewhere: Secondary | ICD-10-CM

## 2020-04-10 DIAGNOSIS — R059 Cough, unspecified: Secondary | ICD-10-CM

## 2020-04-10 DIAGNOSIS — J208 Acute bronchitis due to other specified organisms: Secondary | ICD-10-CM

## 2020-04-10 MED ORDER — DOXYCYCLINE HYCLATE 100 MG PO TABS: 100.0000 mg | ORAL_TABLET | Freq: Two times a day (BID) | ORAL | 0 refills | Status: DC

## 2020-04-10 MED ORDER — SILVER SULFADIAZINE 1 % EX CREA: 1.0000 "application " | TOPICAL_CREAM | Freq: Every day | CUTANEOUS | 0 refills | Status: DC

## 2020-04-10 MED ORDER — CLOTRIMAZOLE-BETAMETHASONE 1-0.05 % EX CREA: 1.0000 "application " | TOPICAL_CREAM | Freq: Two times a day (BID) | CUTANEOUS | 1 refills | Status: DC

## 2020-04-10 NOTE — Progress Notes (Signed)
Virtual Visit via telephone Note Due to COVID-19 pandemic this visit was conducted virtually. This visit type was conducted due to national recommendations for restrictions regarding the COVID-19 Pandemic (e.g. social distancing, sheltering in place) in an effort to limit this patient's exposure and mitigate transmission in our community. All issues noted in this document were discussed and addressed.  A physical exam was not performed with this format.  I connected with Megan [name] on 04/10/20 at 12:13 pm  by telephone and verified that I am speaking with the correct person using two identifiers. Megan [name] is currently located at home and no one is currently with her during visit. The provider, Evelina Dun, FNP is located in their office at time of visit.  I discussed the limitations, risks, security and privacy concerns of performing an evaluation and management service by telephone and the availability of in person appointments. I also discussed with the patient that there may be a patient responsible charge related to this service. The patient expressed understanding and agreed to proceed.   History and Present Illness:  Cough This is a recurrent problem. The current episode started in the past 7 days. The problem has been waxing and waning. The problem occurs every few minutes. The cough is productive of sputum. Associated symptoms include myalgias, nasal congestion and postnasal drip. Pertinent negatives include no chills, ear congestion, ear pain, fever, headaches, rhinorrhea, shortness of breath or wheezing. She has tried rest for the symptoms. The treatment provided mild relief.  Urinary Frequency  This is a new problem. The current episode started in the past 7 days. The problem occurs every urination. The problem has been gradually worsening. The quality of the pain is described as burning. The pain is at a severity of 2/10. The pain is mild. Associated symptoms include  frequency, hesitancy and urgency. Pertinent negatives include no chills, flank pain, hematuria or nausea. She has tried increased fluids for the symptoms. The treatment provided mild relief.      Review of Systems  Constitutional: Negative for chills and fever.  HENT: Positive for postnasal drip. Negative for ear pain and rhinorrhea.   Respiratory: Positive for cough. Negative for shortness of breath and wheezing.   Gastrointestinal: Negative for nausea.  Genitourinary: Positive for frequency, hesitancy and urgency. Negative for flank pain and hematuria.  Musculoskeletal: Positive for myalgias.  Neurological: Negative for headaches.  All other systems reviewed and are negative.    Observations/Objective: No SOB or distress noted   Assessment and Plan: 1. UTI symptoms Force fluids AZO over the counter X2 days RTO if symptoms worsen or do not improve  - doxycycline (VIBRA-TABS) 100 MG tablet; Take 1 tablet (100 mg total) by mouth 2 (two) times daily.  Dispense: 20 tablet; Refill: 0  2. Cough - doxycycline (VIBRA-TABS) 100 MG tablet; Take 1 tablet (100 mg total) by mouth 2 (two) times daily.  Dispense: 20 tablet; Refill: 0  3. Acute bacterial bronchitis - Take meds as prescribed - Use a cool mist humidifier  -Use saline nose sprays frequently -Force fluids -For any cough or congestion  Use plain Mucinex- regular strength or max strength is fine -For fever or aces or pains- take tylenol or ibuprofen. -Call if symptoms worsen or do not improve  - doxycycline (VIBRA-TABS) 100 MG tablet; Take 1 tablet (100 mg total) by mouth 2 (two) times daily.  Dispense: 20 tablet; Refill: 0    I discussed the assessment and treatment plan with the patient.  The patient was provided an opportunity to ask questions and all were answered. The patient agreed with the plan and demonstrated an understanding of the instructions.   The patient was advised to call back or seek an in-person evaluation if  the symptoms worsen or if the condition fails to improve as anticipated.  The above assessment and management plan was discussed with the patient. The patient verbalized understanding of and has agreed to the management plan. Patient is aware to call the clinic if symptoms persist or worsen. Patient is aware when to return to the clinic for a follow-up visit. Patient educated on when it is appropriate to go to the emergency department.   Time call ended: 12:26 pm    I provided 13 minutes of non-face-to-face time during this encounter.    Evelina Dun, FNP

## 2020-04-15 ENCOUNTER — Emergency Department (HOSPITAL_COMMUNITY): Payer: Medicare Other

## 2020-04-15 ENCOUNTER — Encounter (HOSPITAL_COMMUNITY): Payer: Self-pay | Admitting: *Deleted

## 2020-04-15 ENCOUNTER — Emergency Department (HOSPITAL_COMMUNITY)
Admission: EM | Admit: 2020-04-15 | Discharge: 2020-04-16 | Disposition: A | Discharge status: Home / Self Care | Payer: Medicare Other | Attending: Emergency Medicine | Admitting: Emergency Medicine

## 2020-04-15 ENCOUNTER — Other Ambulatory Visit: Payer: Self-pay

## 2020-04-15 DIAGNOSIS — R059 Cough, unspecified: Secondary | ICD-10-CM | POA: Diagnosis not present

## 2020-04-15 DIAGNOSIS — Z79899 Other long term (current) drug therapy: Secondary | ICD-10-CM | POA: Insufficient documentation

## 2020-04-15 DIAGNOSIS — Z7901 Long term (current) use of anticoagulants: Secondary | ICD-10-CM | POA: Diagnosis not present

## 2020-04-15 DIAGNOSIS — J189 Pneumonia, unspecified organism: Secondary | ICD-10-CM | POA: Diagnosis not present

## 2020-04-15 DIAGNOSIS — U071 COVID-19: Secondary | ICD-10-CM | POA: Diagnosis not present

## 2020-04-15 DIAGNOSIS — K8020 Calculus of gallbladder without cholecystitis without obstruction: Secondary | ICD-10-CM | POA: Diagnosis not present

## 2020-04-15 DIAGNOSIS — Z87891 Personal history of nicotine dependence: Secondary | ICD-10-CM | POA: Diagnosis not present

## 2020-04-15 DIAGNOSIS — B9729 Other coronavirus as the cause of diseases classified elsewhere: Secondary | ICD-10-CM | POA: Insufficient documentation

## 2020-04-15 DIAGNOSIS — E039 Hypothyroidism, unspecified: Secondary | ICD-10-CM | POA: Insufficient documentation

## 2020-04-15 DIAGNOSIS — I110 Hypertensive heart disease with heart failure: Secondary | ICD-10-CM | POA: Insufficient documentation

## 2020-04-15 DIAGNOSIS — J209 Acute bronchitis, unspecified: Secondary | ICD-10-CM | POA: Diagnosis not present

## 2020-04-15 DIAGNOSIS — J208 Acute bronchitis due to other specified organisms: Secondary | ICD-10-CM | POA: Diagnosis not present

## 2020-04-15 DIAGNOSIS — Z950 Presence of cardiac pacemaker: Secondary | ICD-10-CM | POA: Diagnosis not present

## 2020-04-15 DIAGNOSIS — I5033 Acute on chronic diastolic (congestive) heart failure: Secondary | ICD-10-CM | POA: Insufficient documentation

## 2020-04-15 DIAGNOSIS — R042 Hemoptysis: Secondary | ICD-10-CM

## 2020-04-15 DIAGNOSIS — I509 Heart failure, unspecified: Secondary | ICD-10-CM | POA: Diagnosis not present

## 2020-04-15 DIAGNOSIS — I517 Cardiomegaly: Secondary | ICD-10-CM | POA: Diagnosis not present

## 2020-04-15 LAB — CBC WITH DIFFERENTIAL/PLATELET
Abs Immature Granulocytes: 0.09 10*3/uL — ABNORMAL HIGH (ref 0.00–0.07)
Basophils Absolute: 0 10*3/uL (ref 0.0–0.1)
Basophils Relative: 0 %
Eosinophils Absolute: 0 10*3/uL (ref 0.0–0.5)
Eosinophils Relative: 0 %
HCT: 43.2 % (ref 36.0–46.0)
Hemoglobin: 13.9 g/dL (ref 12.0–15.0)
Immature Granulocytes: 1 %
Lymphocytes Relative: 5 %
Lymphs Abs: 0.4 10*3/uL — ABNORMAL LOW (ref 0.7–4.0)
MCH: 30.7 pg (ref 26.0–34.0)
MCHC: 32.2 g/dL (ref 30.0–36.0)
MCV: 95.4 fL (ref 80.0–100.0)
Monocytes Absolute: 0.6 10*3/uL (ref 0.1–1.0)
Monocytes Relative: 8 %
Neutro Abs: 6.6 10*3/uL (ref 1.7–7.7)
Neutrophils Relative %: 86 %
Platelets: ADEQUATE 10*3/uL (ref 150–400)
RBC: 4.53 MIL/uL (ref 3.87–5.11)
RDW: 13.9 % (ref 11.5–15.5)
WBC: 7.7 10*3/uL (ref 4.0–10.5)
nRBC: 0 % (ref 0.0–0.2)

## 2020-04-15 LAB — BASIC METABOLIC PANEL
Anion gap: 12 (ref 5–15)
BUN: 19 mg/dL (ref 8–23)
CO2: 21 mmol/L — ABNORMAL LOW (ref 22–32)
Calcium: 8.7 mg/dL — ABNORMAL LOW (ref 8.9–10.3)
Chloride: 100 mmol/L (ref 98–111)
Creatinine, Ser: 1.46 mg/dL — ABNORMAL HIGH (ref 0.44–1.00)
GFR, Estimated: 37 mL/min — ABNORMAL LOW (ref >60)
Glucose, Bld: 142 mg/dL — ABNORMAL HIGH (ref 70–99)
Potassium: 4 mmol/L (ref 3.5–5.1)
Sodium: 133 mmol/L — ABNORMAL LOW (ref 135–145)

## 2020-04-15 LAB — POC SARS CORONAVIRUS 2 AG -  ED: SARS Coronavirus 2 Ag: POSITIVE — AB

## 2020-04-15 MED ORDER — ALBUTEROL SULFATE HFA 108 (90 BASE) MCG/ACT IN AERS
4.0000 | INHALATION_SPRAY | Freq: Once | RESPIRATORY_TRACT | Status: AC
  Administered 2020-04-15: 4 via RESPIRATORY_TRACT
  Filled 2020-04-15: qty 6.7

## 2020-04-15 MED ORDER — METHYLPREDNISOLONE SODIUM SUCC 125 MG IJ SOLR
125.0000 mg | Freq: Once | INTRAMUSCULAR | Status: AC
  Administered 2020-04-15: 125 mg via INTRAVENOUS
  Filled 2020-04-15: qty 2

## 2020-04-15 MED ORDER — PREDNISONE 10 MG PO TABS: 50.0000 mg | ORAL_TABLET | Freq: Every day | ORAL | 0 refills | Status: DC

## 2020-04-15 NOTE — Discharge Instructions (Addendum)
You have COVID-19. Take the medication prescribed for symptom control. Your information has been passed to our ambulatory COVID treatment clinic.  If you are eligible for any kind of infusions, they will contact you.  Return to the ER if your symptoms get worse

## 2020-04-15 NOTE — ED Triage Notes (Signed)
Pt states she has been coughing up blood for unknown time. Pt is poor historian. Pt believes she has Covid.  + nausea and diarrhea, no appetite

## 2020-04-15 NOTE — ED Provider Notes (Addendum)
[facility] EMERGENCY DEPARTMENT Provider Note   CSN: [id number] Arrival date & time: 04/15/20  1453     History No chief complaint on file.   Megan [name] is a 75 y.o. female.  HPI    75 year old female with history of CHF, hypertension, hyperlipidemia, A. fib on Coumadin comes with a chief complaint of hemoptysis. Pt has been coughing for the last few days - over the last 2 days she has noted blood tinged phlegm. She has received 2 shots of a vaccine.  Allegedly, patient's son had asymptomatic COVID prior to her getting sick.  I spoke with patient's son who informs me that he has recovered from COVID-19.  His mom started coughing 5 days ago.    Past Medical History:  Diagnosis Date  . Acute diastolic heart failure ([place])   . Anxiety state, unspecified   . Congestive heart failure, unspecified   . Degenerative joint disease   . GERD (gastroesophageal reflux disease)   . Hyperlipidemia   . Hypertension   . Lymphedema   . Obesity   . OSA (obstructive sleep apnea) 06/09/2015  . Persistent atrial fibrillation (HCC)     post-termination pauses with afib s/p PPM  . Presence of permanent cardiac pacemaker   . Sinoatrial node dysfunction (HCC)    s/p PPM  . Sleep apnea    compliant with CPAP  . Unspecified hypothyroidism   . Unspecified venous (peripheral) insufficiency     Patient Active Problem List   Diagnosis Date Noted  . Chronic congestive heart failure ([place]) 08/24/2019  . Intertrigo 05/11/2017  . OAB (overactive bladder) 02/22/2017  . Lymphedema 01/26/2017  . Gastroesophageal reflux disease without esophagitis 01/26/2017  . Acquired hypothyroidism 01/26/2017  . Mixed hyperlipidemia 01/26/2017  . Pyrexia   . Sleeps in sitting position due to orthopnea 07/31/2015  . Morbid obesity due to excess calories ([place]) 06/09/2015  . PVD (peripheral vascular disease) with claudication ([place]) 06/09/2015  . Long term (current) use of anticoagulants 06/19/2010  .  Tachycardia-bradycardia syndrome ([place]) 11/18/2008  . PPM-[place] 11/18/2008  . HYPERTENSION, BENIGN 02/21/2008  . ATRIAL FIBRILLATION 02/21/2008    Past Surgical History:  Procedure Laterality Date  . CATARACT EXTRACTION W/PHACO Left 09/09/2014   Procedure: CATARACT EXTRACTION PHACO AND INTRAOCULAR LENS PLACEMENT (IOC);  Surgeon: Tonny Branch, MD;  Location: [facility];  Service: Ophthalmology;  Laterality: Left;  CDE: 6.99  . CATARACT EXTRACTION W/PHACO Right 10/07/2014   Procedure: CATARACT EXTRACTION PHACO AND INTRAOCULAR LENS PLACEMENT RIGHT EYE;  Surgeon: Tonny Branch, MD;  Location: [facility];  Service: Ophthalmology;  Laterality: Right;  CDE:5.16  . PACEMAKER INSERTION     SJM by JA     OB History   No obstetric history on file.     Family History  Problem Relation Age of Onset  . Parkinson's disease Other   . CVA Other   . Parkinson's disease Father     Social History   Tobacco Use  . Smoking status: Former Smoker    Packs/day: 0.50    Years: 10.00    Pack years: 5.00    Types: Cigarettes    Quit date: 03/29/1978    Years since quitting: 42.0  . Smokeless tobacco: Never Used  Vaping Use  . Vaping Use: Never used  Substance Use Topics  . Alcohol use: No    Alcohol/week: 0.0 standard drinks  . Drug use: No    Home Medications Prior to Admission medications   Medication Sig Start Date End  Date Taking? Authorizing Provider  predniSONE (DELTASONE) 10 MG tablet Take 5 tablets (50 mg total) by mouth daily. 04/15/20  Yes Varney Biles, MD  albuterol (VENTOLIN HFA) 108 (90 Base) MCG/ACT inhaler Inhale 2 puffs into the lungs every 6 (six) hours as needed for wheezing or shortness of breath. 12/25/18   Terald Sleeper, PA-C  atorvastatin (LIPITOR) 80 MG tablet Take 1 tablet (80 mg total) by mouth daily. 02/12/20   Sharion Balloon, FNP  clotrimazole-betamethasone (LOTRISONE) cream Apply 1 application topically 2 (two) times daily. 04/10/20   Sharion Balloon, FNP  diltiazem  (CARDIZEM CD) 240 MG 24 hr capsule Take 1 capsule (240 mg total) by mouth daily. 02/12/20   Sharion Balloon, FNP  doxycycline (VIBRA-TABS) 100 MG tablet Take 1 tablet (100 mg total) by mouth 2 (two) times daily. 04/10/20   Sharion Balloon, FNP  esomeprazole (NEXIUM) 40 MG capsule Take 1 capsule (40 mg total) by mouth daily. 02/12/20   Sharion Balloon, FNP  levothyroxine (SYNTHROID) 150 MCG tablet Take 1 tablet (150 mcg total) by mouth daily before breakfast. 02/12/20   Sharion Balloon, FNP  metoprolol tartrate (LOPRESSOR) 100 MG tablet Take 1/2 (one-half) tablet by mouth twice daily 02/12/20   Evelina Dun A, FNP  metroNIDAZOLE (METROGEL) 1 % gel Apply topically daily. 12/11/18   Hassell Done, Mary-Margaret, FNP  nystatin (MYCOSTATIN/NYSTOP) powder Apply topically 4 (four) times daily. 01/05/19   Sharion Balloon, FNP  silver sulfADIAZINE (SILVADENE) 1 % cream Apply 1 application topically daily. 04/10/20   Sharion Balloon, FNP  Vitamin D, Ergocalciferol, (DRISDOL) 1.25 MG (50000 UNIT) CAPS capsule Take 1 capsule (50,000 Units total) by mouth every 7 (seven) days. 08/24/19   Loman Brooklyn, FNP  warfarin (COUMADIN) 3 MG tablet Take 3 mg by mouth daily.    [provider]    Allergies    Patient has no known allergies.  Review of Systems   Review of Systems  Constitutional: Positive for activity change.  Respiratory: Positive for cough, chest tightness and shortness of breath.   Cardiovascular: Positive for chest pain.  Gastrointestinal: Negative for nausea and vomiting.  Hematological: Bruises/bleeds easily.  All other systems reviewed and are negative.   Physical Exam Updated Vital Signs BP 121/77   Pulse 96   Temp (!) 97 F (36.1 C) (Temporal)   Resp 20   SpO2 95%   Physical Exam Vitals and nursing note reviewed.  Constitutional:      Appearance: She is well-developed.  HENT:     Head: Normocephalic and atraumatic.  Eyes:     Extraocular Movements: EOM normal.   Cardiovascular:     Rate and Rhythm: Normal rate.  Pulmonary:     Effort: Pulmonary effort is normal.     Breath sounds: Wheezing present.  Abdominal:     General: Bowel sounds are normal.  Musculoskeletal:     Cervical back: Normal range of motion and neck supple.  Skin:    General: Skin is warm and dry.  Neurological:     Mental Status: She is alert and oriented to person, place, and time.     ED Results / Procedures / Treatments   Labs (all labs ordered are listed, but only abnormal results are displayed) Labs Reviewed  POC SARS CORONAVIRUS 2 AG -  ED - Abnormal; Notable for the following components:      Result Value   SARS Coronavirus 2 Ag POSITIVE (*)    All other  components within normal limits  CBC WITH DIFFERENTIAL/PLATELET  BASIC METABOLIC PANEL  PROTIME-INR    EKG None  Radiology DG Chest Portable 1 View  Result Date: 04/15/2020 CLINICAL DATA:  Cough.  Hemoptysis.  Ex-smoker. EXAM: PORTABLE CHEST 1 VIEW COMPARISON:  07/05/2017 FINDINGS: Dual lead pacer. Midline trachea. Cardiomegaly accentuated by AP portable technique. Atherosclerosis in the transverse aorta. No pleural effusion or pneumothorax. Mild pulmonary interstitial prominence. No lobar consolidation. IMPRESSION: Cardiomegaly with mild pulmonary interstitial prominence, likely related to the history of smoking. No overt congestive failure and no evidence of lobar pneumonia. Aortic Atherosclerosis (ICD10-I70.0). Electronically Signed   By: Abigail Miyamoto M.D.   On: 04/15/2020 16:04    Procedures Procedures (including critical care time)  Medications Ordered in ED Medications  methylPREDNISolone sodium succinate (SOLU-MEDROL) 125 mg/2 mL injection 125 mg (has no administration in time range)  albuterol (VENTOLIN HFA) 108 (90 Base) MCG/ACT inhaler 4 puff (4 puffs Inhalation Given 04/15/20 2250)    ED Course  I have reviewed the triage vital signs and the nursing notes.  Pertinent labs & imaging results  that were available during my care of the patient were reviewed by me and considered in my medical decision making (see chart for details).    MDM Rules/Calculators/A&P                         75 year old female consider chief complaint of hemoptysis. She has history of multiple medical comorbidities including A. fib on Coumadin, CAD, OSA, CHF.  Patient has mild wheezing on exam.  Albuterol ordered. She will get Solu-Medrol here as well, as I suspect that she has bronchitis from COVID-19.  She did not get some chest discomfort and shortness of breath, therefore we will get a CT scan to rule out PE.  She is on Coumadin which makes it less likely to have PE.  Have also spoken with patient's son.  He is aware that if the work-up is negative then patient will be discharged. Patient's information has been sent to the COVID-19 ambulatory treatment clinic.  Megan [name] was evaluated in Emergency Department on 04/15/2020 for the symptoms described in the history of present illness. She was evaluated in the context of the global COVID-19 pandemic, which necessitated consideration that the patient might be at risk for infection with the SARS-CoV-2 virus that causes COVID-19. Institutional protocols and algorithms that pertain to the evaluation of patients at risk for COVID-19 are in a state of rapid change based on information released by regulatory bodies including the CDC and federal and state organizations. These policies and algorithms were followed during the patient's care in the ED.   Final Clinical Impression(s) / ED Diagnoses Final diagnoses:  Hemoptysis  Acute bronchitis due to 2019 novel coronavirus    Rx / DC Orders ED Discharge Orders         Ordered    Ambulatory referral for Covid Treatment        04/15/20 2252    predniSONE (DELTASONE) 10 MG tablet  Daily        04/15/20 2253           Varney Biles, MD 04/15/20 2257    Varney Biles, MD 04/15/20 2258

## 2020-04-16 DIAGNOSIS — U071 COVID-19: Secondary | ICD-10-CM | POA: Diagnosis not present

## 2020-04-16 DIAGNOSIS — R059 Cough, unspecified: Secondary | ICD-10-CM | POA: Diagnosis not present

## 2020-04-16 DIAGNOSIS — I517 Cardiomegaly: Secondary | ICD-10-CM | POA: Diagnosis not present

## 2020-04-16 DIAGNOSIS — J189 Pneumonia, unspecified organism: Secondary | ICD-10-CM | POA: Diagnosis not present

## 2020-04-16 DIAGNOSIS — K8020 Calculus of gallbladder without cholecystitis without obstruction: Secondary | ICD-10-CM | POA: Diagnosis not present

## 2020-04-16 DIAGNOSIS — I509 Heart failure, unspecified: Secondary | ICD-10-CM | POA: Diagnosis not present

## 2020-04-16 DIAGNOSIS — R042 Hemoptysis: Secondary | ICD-10-CM | POA: Diagnosis not present

## 2020-04-16 LAB — PROTIME-INR
INR: 3.2 — ABNORMAL HIGH (ref 0.8–1.2)
Prothrombin Time: 31.9 seconds — ABNORMAL HIGH (ref 11.4–15.2)

## 2020-04-16 MED ORDER — IOHEXOL 350 MG/ML SOLN
75.0000 mL | Freq: Once | INTRAVENOUS | Status: AC | PRN
  Administered 2020-04-16: 75 mL via INTRAVENOUS

## 2020-04-16 NOTE — ED Provider Notes (Signed)
12:25 AM Assumed care from Dr. Kathrynn Humble, please see their note for full history, physical and decision making until this point. In brief this is a 75 y.o. year old female who presented to the ED tonight with No chief complaint on file.     75 year old female who has a history of COVID's been sick for couple weeks last week or so she had a cough and then the last few days she has had some hemoptysis with it as well.  Her vital signs are within normal limits.  She otherwise appears well.  She is not actually having hematemesis.  She would likely be discharged if her angio is negative.  CT negative.  Vital signs stable and normal.  Patient wants to be admitted to the hospital as she feels weak.  She states that when she had bronchitis in the past admitted to the hospital.  Reviewing her records and examining her I do not see any indication for medical admission.  Discussed with her get some home health and this was acceptable.  Orders placed.  Discharge instructions, including strict return precautions for new or worsening symptoms, given. Patient and/or family verbalized understanding and agreement with the plan as described.   Labs, studies and imaging reviewed by myself and considered in medical decision making if ordered. Imaging interpreted by radiology.  Labs Reviewed  CBC WITH DIFFERENTIAL/PLATELET - Abnormal; Notable for the following components:      Result Value   Lymphs Abs 0.4 (*)    Abs Immature Granulocytes 0.09 (*)    All other components within normal limits  BASIC METABOLIC PANEL - Abnormal; Notable for the following components:   Sodium 133 (*)    CO2 21 (*)    Glucose, Bld 142 (*)    Creatinine, Ser 1.46 (*)    Calcium 8.7 (*)    GFR, Estimated 37 (*)    All other components within normal limits  POC SARS CORONAVIRUS 2 AG -  ED - Abnormal; Notable for the following components:   SARS Coronavirus 2 Ag POSITIVE (*)    All other components within normal limits  PROTIME-INR     DG Chest Portable 1 View  Final Result    CT Angio Chest PE W and/or Wo Contrast    (Results Pending)    No follow-ups on file.    Arvell Pulsifer, Corene Cornea, MD 04/16/20 [phone]

## 2020-04-29 ENCOUNTER — Ambulatory Visit (INDEPENDENT_AMBULATORY_CARE_PROVIDER_SITE_OTHER): Payer: Medicare Other | Admitting: *Deleted

## 2020-04-29 DIAGNOSIS — I4891 Unspecified atrial fibrillation: Secondary | ICD-10-CM

## 2020-04-29 DIAGNOSIS — Z5181 Encounter for therapeutic drug level monitoring: Secondary | ICD-10-CM

## 2020-04-29 LAB — POCT INR: INR: 4.8 — AB (ref 2.0–3.0)

## 2020-04-29 NOTE — Patient Instructions (Signed)
Hold warfarin tonight and tomorrow night then resume 1 tablet daily Continue greens Recheck in 2 weeks

## 2020-05-08 ENCOUNTER — Ambulatory Visit (INDEPENDENT_AMBULATORY_CARE_PROVIDER_SITE_OTHER): Payer: Medicare Other | Admitting: Family Medicine

## 2020-05-08 ENCOUNTER — Encounter: Payer: Self-pay | Admitting: Family Medicine

## 2020-05-08 DIAGNOSIS — R399 Unspecified symptoms and signs involving the genitourinary system: Secondary | ICD-10-CM | POA: Diagnosis not present

## 2020-05-08 MED ORDER — FLUCONAZOLE 150 MG PO TABS: 150.0000 mg | ORAL_TABLET | Freq: Once | ORAL | 0 refills | Status: AC

## 2020-05-08 MED ORDER — CEPHALEXIN 500 MG PO CAPS: 500.0000 mg | ORAL_CAPSULE | Freq: Four times a day (QID) | ORAL | 0 refills | Status: DC

## 2020-05-08 NOTE — Progress Notes (Signed)
Virtual Visit via telephone Note  I connected with Megan [name] on 05/08/20 at 1714 by telephone and verified that I am speaking with the correct person using two identifiers. Megan [name] is currently located at home and patient are currently with her during visit. The provider, Fransisca Kaufmann Pate Aylward, MD is located in their office at time of visit.  Call ended at 1725  I discussed the limitations, risks, security and privacy concerns of performing an evaluation and management service by telephone and the availability of in person appointments. I also discussed with the patient that there may be a patient responsible charge related to this service. The patient expressed understanding and agreed to proceed.   History and Present Illness: Patient is calling in for yeast and stopped doxycycline early because of yeast issues.  She went to ED for bronchitis and was treated there as well. She is having more urinary issues. The doxycycline did clear her skin and eyes.  She currently has no symptoms for covid. She is currently having urinary frequency and dysuria.  She feels like doxycycline did not clear the UTI.  She likes cephalexin.   No diagnosis found.  Outpatient Encounter Medications as of 05/08/2020  Medication Sig  . albuterol (VENTOLIN HFA) 108 (90 Base) MCG/ACT inhaler Inhale 2 puffs into the lungs every 6 (six) hours as needed for wheezing or shortness of breath.  Marland Kitchen atorvastatin (LIPITOR) 80 MG tablet Take 1 tablet (80 mg total) by mouth daily.  . clotrimazole-betamethasone (LOTRISONE) cream Apply 1 application topically 2 (two) times daily.  Marland Kitchen diltiazem (CARDIZEM CD) 240 MG 24 hr capsule Take 1 capsule (240 mg total) by mouth daily.  Marland Kitchen doxycycline (VIBRA-TABS) 100 MG tablet Take 1 tablet (100 mg total) by mouth 2 (two) times daily.  Marland Kitchen esomeprazole (NEXIUM) 40 MG capsule Take 1 capsule (40 mg total) by mouth daily.  Marland Kitchen levothyroxine (SYNTHROID) 150 MCG tablet Take 1 tablet (150 mcg  total) by mouth daily before breakfast.  . metoprolol tartrate (LOPRESSOR) 100 MG tablet Take 1/2 (one-half) tablet by mouth twice daily  . metroNIDAZOLE (METROGEL) 1 % gel Apply topically daily.  Marland Kitchen nystatin (MYCOSTATIN/NYSTOP) powder Apply topically 4 (four) times daily.  . predniSONE (DELTASONE) 10 MG tablet Take 5 tablets (50 mg total) by mouth daily.  . silver sulfADIAZINE (SILVADENE) 1 % cream Apply 1 application topically daily.  . Vitamin D, Ergocalciferol, (DRISDOL) 1.25 MG (50000 UNIT) CAPS capsule Take 1 capsule (50,000 Units total) by mouth every 7 (seven) days.  Marland Kitchen warfarin (COUMADIN) 3 MG tablet Take 3 mg by mouth daily.   No facility-administered encounter medications on file as of 05/08/2020.    Review of Systems  Constitutional: Negative for chills and fever.  HENT: Negative for congestion, ear discharge and ear pain.   Eyes: Negative for redness and visual disturbance.  Respiratory: Negative for chest tightness and shortness of breath.   Cardiovascular: Negative for chest pain and leg swelling.  Gastrointestinal: Negative for abdominal pain.  Genitourinary: Positive for dysuria, frequency and urgency. Negative for difficulty urinating, flank pain, hematuria, vaginal bleeding, vaginal discharge and vaginal pain.  Musculoskeletal: Negative for back pain and gait problem.  Skin: Negative for rash.  Neurological: Negative for light-headedness and headaches.  Psychiatric/Behavioral: Negative for agitation and behavioral problems.  All other systems reviewed and are negative.   Observations/Objective: Patient sounds comfortable and in no acute distress  Assessment and Plan: Problem List Items Addressed This Visit   None   Visit  Diagnoses    UTI symptoms    -  Primary   Relevant Medications   cephALEXin (KEFLEX) 500 MG capsule   fluconazole (DIFLUCAN) 150 MG tablet      Will treat like uti and diflucan  Instructed to half coumadin for 3 days but patient wants to  wait til she sees coumadin clinic. Follow up plan: Return if symptoms worsen or fail to improve.     I discussed the assessment and treatment plan with the patient. The patient was provided an opportunity to ask questions and all were answered. The patient agreed with the plan and demonstrated an understanding of the instructions.   The patient was advised to call back or seek an in-person evaluation if the symptoms worsen or if the condition fails to improve as anticipated.  The above assessment and management plan was discussed with the patient. The patient verbalized understanding of and has agreed to the management plan. Patient is aware to call the clinic if symptoms persist or worsen. Patient is aware when to return to the clinic for a follow-up visit. Patient educated on when it is appropriate to go to the emergency department.    I provided 11 minutes of non-face-to-face time during this encounter.    Worthy Rancher, MD

## 2020-05-13 ENCOUNTER — Other Ambulatory Visit: Payer: Self-pay

## 2020-05-13 ENCOUNTER — Ambulatory Visit (INDEPENDENT_AMBULATORY_CARE_PROVIDER_SITE_OTHER): Payer: Medicare Other | Admitting: *Deleted

## 2020-05-13 DIAGNOSIS — I4891 Unspecified atrial fibrillation: Secondary | ICD-10-CM

## 2020-05-13 DIAGNOSIS — Z5181 Encounter for therapeutic drug level monitoring: Secondary | ICD-10-CM | POA: Diagnosis not present

## 2020-05-13 LAB — POCT INR: INR: 3.2 — AB (ref 2.0–3.0)

## 2020-05-13 NOTE — Patient Instructions (Signed)
Hold warfarin tonight then decrease dose to 1 tablet daily except 1/2 tablet on Saturdays Continue greens Recheck in 2 weeks

## 2020-05-16 ENCOUNTER — Ambulatory Visit: Payer: Medicare Other | Admitting: Family

## 2020-05-19 ENCOUNTER — Other Ambulatory Visit: Payer: Self-pay | Admitting: Family Medicine

## 2020-05-21 ENCOUNTER — Telehealth: Payer: Self-pay

## 2020-05-21 NOTE — Telephone Encounter (Signed)
  Prescription Request  05/21/2020  What is the name of the medication or equipment? Ergocalciferol (Drisdol) 1.25 mg (50000 UNIT). Patient was seen 02-12-20 and has a six month appt 08-11-20 and RX was refused . Patient is out.  Have you contacted your pharmacy to request a refill? (if applicable) YES   Which pharmacy would you like this sent to? [facility]   Patient notified that their request is being sent to the clinical staff for review and that they should receive a response within 2 business days.

## 2020-05-21 NOTE — Telephone Encounter (Signed)
Please advise on Vit D refill, I refused no level under labs

## 2020-05-22 MED ORDER — VITAMIN D (ERGOCALCIFEROL) 1.25 MG (50000 UNIT) PO CAPS: 50000.0000 [IU] | ORAL_CAPSULE | ORAL | 1 refills | Status: DC

## 2020-05-22 NOTE — Telephone Encounter (Signed)
Prescription sent to pharmacy.

## 2020-05-27 ENCOUNTER — Ambulatory Visit (INDEPENDENT_AMBULATORY_CARE_PROVIDER_SITE_OTHER): Payer: Medicare Other | Admitting: *Deleted

## 2020-05-27 DIAGNOSIS — Z5181 Encounter for therapeutic drug level monitoring: Secondary | ICD-10-CM

## 2020-05-27 DIAGNOSIS — I4891 Unspecified atrial fibrillation: Secondary | ICD-10-CM | POA: Diagnosis not present

## 2020-05-27 LAB — POCT INR: INR: 2.3 (ref 2.0–3.0)

## 2020-05-27 NOTE — Patient Instructions (Signed)
Continue warfarin 1 tablet daily except 1/2 tablet on Saturdays Continue greens Recheck in 3 weeks

## 2020-06-09 ENCOUNTER — Ambulatory Visit (INDEPENDENT_AMBULATORY_CARE_PROVIDER_SITE_OTHER): Payer: Medicare Other | Admitting: Family

## 2020-06-09 ENCOUNTER — Encounter: Payer: Self-pay | Admitting: Family

## 2020-06-09 ENCOUNTER — Other Ambulatory Visit: Payer: Self-pay

## 2020-06-09 VITALS — BP 126/72 | HR 78 | Temp 97.2°F | Ht 62.0 in

## 2020-06-09 DIAGNOSIS — S80922A Unspecified superficial injury of left lower leg, initial encounter: Secondary | ICD-10-CM | POA: Diagnosis not present

## 2020-06-09 DIAGNOSIS — R7303 Prediabetes: Secondary | ICD-10-CM | POA: Diagnosis not present

## 2020-06-09 DIAGNOSIS — Z780 Asymptomatic menopausal state: Secondary | ICD-10-CM | POA: Diagnosis not present

## 2020-06-09 DIAGNOSIS — T148XXA Other injury of unspecified body region, initial encounter: Secondary | ICD-10-CM

## 2020-06-09 MED ORDER — BLOOD GLUCOSE METER KIT: PACK | 0 refills | Status: DC

## 2020-06-09 NOTE — Patient Instructions (Signed)
Hematoma A hematoma is a collection of blood under the skin, in an organ, in a body space, in a joint space, or in other tissue. The blood can thicken (clot) to form a lump that you can see and feel. The lump is often firm and may become sore and tender. Most hematomas get better in a few days to weeks. However, some hematomas may be serious and require medical care. Hematomas can range from very small to very large. What are the causes? This condition is caused by:  A blunt or penetrating injury.  A leakage from a blood vessel under the skin.  Some medical procedures, including surgeries, such as oral surgery, face lifts, and surgeries on the joints.  Some medical conditions that cause bleeding or bruising. There may be multiple hematomas that appear in different areas of the body. What increases the risk? You are more likely to develop this condition if:  You are an older adult.  You use blood thinners. What are the signs or symptoms? Symptoms of this condition depend on where the hematoma is located.  Common symptoms of a hematoma that is under the skin include:  A firm lump on the body.  Pain and tenderness in the area.  Bruising. Blue, dark blue, purple-red, or yellowish skin (discoloration) may appear at the site of the hematoma if the hematoma is close to the surface of the skin. Common symptoms of a hematoma that is deep in the tissues or body spaces may be less obvious. They include:  A collection of blood in the stomach (intra-abdominal hematoma). This may cause pain in the abdomen, weakness, fainting, and shortness of breath.  A collection of blood in the head (intracranial hematoma). This may cause a headache or symptoms such as weakness, trouble speaking or understanding, or a change in consciousness.   How is this diagnosed? This condition is diagnosed based on:  Your medical history.  A physical exam.  Imaging tests, such as an ultrasound or CT scan. These may  be needed if your health care provider suspects a hematoma in deeper tissues or body spaces.  Blood tests. These may be needed if your health care provider believes that the hematoma is caused by a medical condition. How is this treated? Treatment for this condition depends on the cause, size, and location of the hematoma. Treatment may include:  Doing nothing. The majority of hematomas do not need treatment as many of them go away on their own over time.  Surgery or close monitoring. This may be needed for large hematomas or hematomas that affect vital organs.  Medicines. Medicines may be given if there is an underlying medical cause for the hematoma. Follow these instructions at home: Managing pain, stiffness, and swelling  If directed, put ice on the affected area. ? Put ice in a plastic bag. ? Place a towel between your skin and the bag. ? Leave the ice on for 20 minutes, 2-3 times a day for the first couple of days.  If directed, apply heat to the affected area after applying ice for a couple of days. Use the heat source that your health care provider recommends, such as a moist heat pack or a heating pad. ? Place a towel between your skin and the heat source. ? Leave the heat on for 20-30 minutes. ? Remove the heat if your skin turns bright red. This is especially important if you are unable to feel pain, heat, or cold. You may have a greater  risk of getting burned.  Raise (elevate) the affected area above the level of your heart while you are sitting or lying down.  If told, wrap the affected area with an elastic bandage. The bandage applies pressure (compression) to the area, which may help to reduce swelling and promote healing. Do not wrap the bandage too tightly around the affected area.  If your hematoma is on a leg or foot (lower extremity) and is painful, your health care provider may recommend crutches. Use them as told by your health care provider.   General  instructions  Take over-the-counter and prescription medicines only as told by your health care provider.  Keep all follow-up visits as told by your health care provider. This is important. Contact a health care provider if:  You have a fever.  The swelling or discoloration gets worse.  You develop more hematomas. Get help right away if:  Your pain is worse or your pain is not controlled with medicine.  Your skin over the hematoma breaks or starts bleeding.  Your hematoma is in your chest or abdomen and you have weakness, shortness of breath, or a change in consciousness.  You have a hematoma on your scalp that is caused by a fall or injury, and you also have: ? A headache that gets worse. ? Trouble speaking or understanding speech. ? Weakness. ? Change in alertness or consciousness. Summary  A hematoma is a collection of blood under the skin, in an organ, in a body space, in a joint space, or in other tissue.  This condition usually does not need treatment because many hematomas go away on their own over time.  Large hematomas, or those that may affect vital organs, may need surgical drainage or monitoring. If the hematoma is caused by a medical condition, medicines may be prescribed.  Get help right away if your hematoma breaks or starts to bleed, you have shortness of breath, or you have a headache or trouble speaking after a fall. This information is not intended to replace advice given to you by your health care provider. Make sure you discuss any questions you have with your health care provider. Document Revised: 08/09/2018 Document Reviewed: 08/18/2017 Elsevier Patient Education  2021 [employer].

## 2020-06-09 NOTE — Progress Notes (Signed)
   Subjective:    Patient ID: Megan [name], female    DOB: 1946/01/01, 75 y.o.   MRN: [id number]  Chief Complaint  Patient presents with  . Wound Check    WANTS WOUND Care     HPI PT presents to the office today with a swollen spot on left lower leg. She reports she fell outside on ice on 04/15/20 and had a deep bruising. The area has improved, but still has a hard nodule that is tender on left left lateral leg.   She reports she has had a hx of DVT in left lower leg and is worried about this. She is taking warfarin daily.   Pt is prediabetic and wants rx for meter. She was given prednisone after her fall and states her glucose was increased.   Review of Systems  Cardiovascular: Positive for leg swelling.  All other systems reviewed and are negative.      Objective:   Physical Exam Vitals reviewed.  Constitutional:      General: She is not in acute distress.    Appearance: She is well-developed. She is obese.  HENT:     Head: Normocephalic and atraumatic.  Eyes:     Pupils: Pupils are equal, round, and reactive to light.  Neck:     Thyroid: No thyromegaly.  Cardiovascular:     Rate and Rhythm: Normal rate and regular rhythm.     Heart sounds: Normal heart sounds. No murmur heard.   Pulmonary:     Effort: Pulmonary effort is normal. No respiratory distress.     Breath sounds: Normal breath sounds. No wheezing.  Abdominal:     General: Bowel sounds are normal. There is no distension.     Palpations: Abdomen is soft.     Tenderness: There is no abdominal tenderness.  Musculoskeletal:        General: Tenderness present.     Cervical back: Normal range of motion and neck supple.  Skin:    General: Skin is warm and dry.          Comments: Hematoma approx 5X4.2 cm   Neurological:     Mental Status: She is alert and oriented to person, place, and time.     Cranial Nerves: No cranial nerve deficit.     Deep Tendon Reflexes: Reflexes are normal and symmetric.   Psychiatric:        Behavior: Behavior normal.        Thought Content: Thought content normal.        Judgment: Judgment normal.       BP 126/72   Pulse 78   Temp (!) 97.2 F (36.2 C) (Temporal)   Ht $R'5\' 2"'GG$  (1.575 m)   BMI 53.04 kg/m      Assessment & Plan:  Megan [name] comes in today with chief complaint of Wound Check ([place] )   Diagnosis and orders addressed:  1. Hematoma Will do [place] today. Given on warfarin I think this is a resolving hematoma and just need to be watched - US Venous Img Lower Unilateral Left; Future  2. Prediabetes Continue to monitor glucose  Low carb diet  - blood glucose meter kit and supplies; Dispense based on patient and insurance preference. Use up to four times daily as directed. (FOR ICD-10 E10.9, E11.9).  Dispense: 1 each; Refill: 0  3. Post-menopausal - DG [facility] DEXA    Evelina Dun, FNP

## 2020-06-19 ENCOUNTER — Ambulatory Visit (INDEPENDENT_AMBULATORY_CARE_PROVIDER_SITE_OTHER): Payer: Medicare Other | Admitting: Pharmacist

## 2020-06-19 DIAGNOSIS — I4891 Unspecified atrial fibrillation: Secondary | ICD-10-CM

## 2020-06-19 DIAGNOSIS — Z5181 Encounter for therapeutic drug level monitoring: Secondary | ICD-10-CM

## 2020-06-19 LAB — POCT INR: INR: 2.1 (ref 2.0–3.0)

## 2020-06-19 NOTE — Patient Instructions (Signed)
Description   Continue warfarin 1 tablet daily except 1/2 tablet on Saturdays Continue greens Recheck in 3 weeks

## 2020-06-23 ENCOUNTER — Other Ambulatory Visit: Payer: Self-pay | Admitting: Internal Medicine

## 2020-06-30 ENCOUNTER — Telehealth: Payer: Self-pay | Admitting: Family

## 2020-07-01 ENCOUNTER — Telehealth: Payer: Self-pay

## 2020-07-01 NOTE — Telephone Encounter (Signed)
Patient aware she will need a visit and she will call back to schedule.

## 2020-07-03 ENCOUNTER — Telehealth: Payer: Self-pay

## 2020-07-04 ENCOUNTER — Ambulatory Visit (HOSPITAL_COMMUNITY): Payer: Medicare Other | Attending: Family

## 2020-07-08 ENCOUNTER — Ambulatory Visit (INDEPENDENT_AMBULATORY_CARE_PROVIDER_SITE_OTHER): Payer: Medicare Other | Admitting: *Deleted

## 2020-07-08 DIAGNOSIS — I4891 Unspecified atrial fibrillation: Secondary | ICD-10-CM | POA: Diagnosis not present

## 2020-07-08 DIAGNOSIS — Z5181 Encounter for therapeutic drug level monitoring: Secondary | ICD-10-CM

## 2020-07-08 LAB — POCT INR: INR: 1.9 — AB (ref 2.0–3.0)

## 2020-07-08 NOTE — Patient Instructions (Signed)
Take 2 tablets tonight then resume 1 tablet daily except 1/2 tablet on Saturdays Continue greens Recheck in 3 weeks

## 2020-07-29 ENCOUNTER — Ambulatory Visit (INDEPENDENT_AMBULATORY_CARE_PROVIDER_SITE_OTHER): Payer: Medicare Other | Admitting: *Deleted

## 2020-07-29 DIAGNOSIS — I4891 Unspecified atrial fibrillation: Secondary | ICD-10-CM

## 2020-07-29 DIAGNOSIS — Z5181 Encounter for therapeutic drug level monitoring: Secondary | ICD-10-CM

## 2020-07-29 LAB — POCT INR: INR: 1.8 — AB (ref 2.0–3.0)

## 2020-07-29 NOTE — Patient Instructions (Signed)
Increase warfarin 1 tablet daily  Continue greens Recheck in 3 weeks

## 2020-08-11 ENCOUNTER — Ambulatory Visit: Payer: Medicare Other | Admitting: Family

## 2020-08-14 ENCOUNTER — Ambulatory Visit (INDEPENDENT_AMBULATORY_CARE_PROVIDER_SITE_OTHER): Payer: Medicare Other | Admitting: Family

## 2020-08-14 ENCOUNTER — Ambulatory Visit: Payer: Medicare Other

## 2020-08-14 ENCOUNTER — Other Ambulatory Visit: Payer: Self-pay

## 2020-08-14 ENCOUNTER — Encounter: Payer: Self-pay | Admitting: Family

## 2020-08-14 VITALS — BP 124/82 | HR 67 | Temp 97.4°F | Ht 62.0 in | Wt 290.0 lb

## 2020-08-14 DIAGNOSIS — R7303 Prediabetes: Secondary | ICD-10-CM

## 2020-08-14 DIAGNOSIS — E782 Mixed hyperlipidemia: Secondary | ICD-10-CM | POA: Diagnosis not present

## 2020-08-14 DIAGNOSIS — G4733 Obstructive sleep apnea (adult) (pediatric): Secondary | ICD-10-CM | POA: Diagnosis not present

## 2020-08-14 DIAGNOSIS — R399 Unspecified symptoms and signs involving the genitourinary system: Secondary | ICD-10-CM

## 2020-08-14 DIAGNOSIS — Z7901 Long term (current) use of anticoagulants: Secondary | ICD-10-CM

## 2020-08-14 DIAGNOSIS — E039 Hypothyroidism, unspecified: Secondary | ICD-10-CM | POA: Diagnosis not present

## 2020-08-14 DIAGNOSIS — I739 Peripheral vascular disease, unspecified: Secondary | ICD-10-CM

## 2020-08-14 DIAGNOSIS — I4891 Unspecified atrial fibrillation: Secondary | ICD-10-CM

## 2020-08-14 DIAGNOSIS — I5032 Chronic diastolic (congestive) heart failure: Secondary | ICD-10-CM | POA: Diagnosis not present

## 2020-08-14 DIAGNOSIS — K219 Gastro-esophageal reflux disease without esophagitis: Secondary | ICD-10-CM | POA: Diagnosis not present

## 2020-08-14 LAB — URINALYSIS, COMPLETE
Bilirubin, UA: NEGATIVE
Glucose, UA: NEGATIVE
Ketones, UA: NEGATIVE
Leukocytes,UA: NEGATIVE
Nitrite, UA: NEGATIVE
Protein,UA: NEGATIVE
RBC, UA: NEGATIVE
Specific Gravity, UA: 1.015 (ref 1.005–1.030)
Urobilinogen, Ur: 0.2 mg/dL (ref 0.2–1.0)
pH, UA: 5.5 (ref 5.0–7.5)

## 2020-08-14 LAB — BAYER DCA HB A1C WAIVED: HB A1C (BAYER DCA - WAIVED): 6.2 % (ref <7.0)

## 2020-08-14 NOTE — Patient Instructions (Signed)
Sleep Apnea Sleep apnea is a condition in which breathing pauses or becomes shallow during sleep. Episodes of sleep apnea usually last 10 seconds or longer, and they may occur as many as 20 times an hour. Sleep apnea disrupts your sleep and keeps your body from getting the rest that it needs. This condition can increase your risk of certain health problems, including:  Heart attack.  Stroke.  Obesity.  Diabetes.  Heart failure.  Irregular heartbeat. What are the causes? There are three kinds of sleep apnea:  Obstructive sleep apnea. This kind is caused by a blocked or collapsed airway.  Central sleep apnea. This kind happens when the part of the brain that controls breathing does not send the correct signals to the muscles that control breathing.  Mixed sleep apnea. This is a combination of obstructive and central sleep apnea. The most common cause of this condition is a collapsed or blocked airway. An airway can collapse or become blocked if:  Your throat muscles are abnormally relaxed.  Your tongue and tonsils are larger than normal.  You are overweight.  Your airway is smaller than normal.   What increases the risk? You are more likely to develop this condition if you:  Are overweight.  Smoke.  Have a smaller than normal airway.  Are elderly.  Are female.  Drink alcohol.  Take sedatives or tranquilizers.  Have a family history of sleep apnea. What are the signs or symptoms? Symptoms of this condition include:  Trouble staying asleep.  Daytime sleepiness and tiredness.  Irritability.  Loud snoring.  Morning headaches.  Trouble concentrating.  Forgetfulness.  Decreased interest in sex.  Unexplained sleepiness.  Mood swings.  Personality changes.  Feelings of depression.  Waking up often during the night to urinate.  Dry mouth.  Sore throat. How is this diagnosed? This condition may be diagnosed with:  A medical history.  A physical  exam.  A series of tests that are done while you are sleeping (sleep study). These tests are usually done in a sleep lab, but they may also be done at home. How is this treated? Treatment for this condition aims to restore normal breathing and to ease symptoms during sleep. It may involve managing health issues that can affect breathing, such as high blood pressure or obesity. Treatment may include:  Sleeping on your side.  Using a decongestant if you have nasal congestion.  Avoiding the use of depressants, including alcohol, sedatives, and narcotics.  Losing weight if you are overweight.  Making changes to your diet.  Quitting smoking.  Using a device to open your airway while you sleep, such as: ? An oral appliance. This is a custom-made mouthpiece that shifts your lower jaw forward. ? A continuous positive airway pressure (CPAP) device. This device blows air through a mask when you breathe out (exhale). ? A nasal expiratory positive airway pressure (EPAP) device. This device has valves that you put into each nostril. ? A bi-level positive airway pressure (BPAP) device. This device blows air through a mask when you breathe in (inhale) and breathe out (exhale).  Having surgery if other treatments do not work. During surgery, excess tissue is removed to create a wider airway. It is important to get treatment for sleep apnea. Without treatment, this condition can lead to:  High blood pressure.  Coronary artery disease.  In men, an inability to achieve or maintain an erection (impotence).  Reduced thinking abilities.   Follow these instructions at home: Lifestyle    Make any lifestyle changes that your health care provider recommends.  Eat a healthy, well-balanced diet.  Take steps to lose weight if you are overweight.  Avoid using depressants, including alcohol, sedatives, and narcotics.  Do not use any products that contain nicotine or tobacco, such as cigarettes,  e-cigarettes, and chewing tobacco. If you need help quitting, ask your health care provider. General instructions  Take over-the-counter and prescription medicines only as told by your health care provider.  If you were given a device to open your airway while you sleep, use it only as told by your health care provider.  If you are having surgery, make sure to tell your health care provider you have sleep apnea. You may need to bring your device with you.  Keep all follow-up visits as told by your health care provider. This is important. Contact a health care provider if:  The device that you received to open your airway during sleep is uncomfortable or does not seem to be working.  Your symptoms do not improve.  Your symptoms get worse. Get help right away if:  You develop: ? Chest pain. ? Shortness of breath. ? Discomfort in your back, arms, or stomach.  You have: ? Trouble speaking. ? Weakness on one side of your body. ? Drooping in your face. These symptoms may represent a serious problem that is an emergency. Do not wait to see if the symptoms will go away. Get medical help right away. Call your local emergency services (911 in the [place] Do not drive yourself to the hospital. Summary  Sleep apnea is a condition in which breathing pauses or becomes shallow during sleep.  The most common cause is a collapsed or blocked airway.  The goal of treatment is to restore normal breathing and to ease symptoms during sleep. This information is not intended to replace advice given to you by your health care provider. Make sure you discuss any questions you have with your health care provider. Document Revised: 08/30/2018 Document Reviewed: 11/08/2017 Elsevier Patient Education  2021 Elsevier Inc.  

## 2020-08-14 NOTE — Progress Notes (Signed)
Subjective:    Patient ID: Megan [name], female    DOB: April 03, 1945, 75 y.o.   MRN: [id number]  Chief Complaint  Patient presents with  . Medical Management of Chronic Issues   Pt presents to the office today for chronic follow up.  Pt is followed by Cardiologists annually for pacemaker, CHF, A Fib. She also goes to the coumadin clinic once a month.   Pt states she has generalized weakness and can not walk any distance.  Hypertension This is a chronic problem. The current episode started more than 1 year ago. The problem has been resolved since onset. The problem is controlled. Pertinent negatives include no malaise/fatigue, peripheral edema or shortness of breath. Risk factors for coronary artery disease include dyslipidemia, diabetes mellitus, obesity and sedentary lifestyle. The current treatment provides moderate improvement. There is no history of CVA or heart failure. Identifiable causes of hypertension include a thyroid problem.  Hyperlipidemia This is a chronic problem. The current episode started more than 1 year ago. The problem is controlled. Recent lipid tests were reviewed and are normal. Exacerbating diseases include obesity. Pertinent negatives include no shortness of breath. Current antihyperlipidemic treatment includes statins. The current treatment provides moderate improvement of lipids. Risk factors for coronary artery disease include dyslipidemia, hypertension, a sedentary lifestyle and post-menopausal.  Gastroesophageal Reflux She complains of belching and heartburn. This is a chronic problem. The current episode started more than 1 year ago. The problem occurs occasionally. The problem has been waxing and waning. The symptoms are aggravated by certain foods. Associated symptoms include fatigue. She has tried a PPI for the symptoms. The treatment provided moderate relief.  Thyroid Problem Presents for follow-up visit. Symptoms include dry skin and fatigue. Patient reports  no cold intolerance, depressed mood or leg swelling. The symptoms have been stable. Her past medical history is significant for hyperlipidemia. There is no history of heart failure.  Congestive Heart Failure Presents for follow-up visit. Associated symptoms include edema and fatigue. Pertinent negatives include no shortness of breath. The symptoms have been stable.      Review of Systems  Constitutional: Positive for fatigue. Negative for malaise/fatigue.  Respiratory: Negative for shortness of breath.   Gastrointestinal: Positive for heartburn.  Endocrine: Negative for cold intolerance.  All other systems reviewed and are negative.      Objective:   Physical Exam Vitals reviewed.  Constitutional:      General: She is not in acute distress.    Appearance: She is well-developed. She is obese.  HENT:     Head: Normocephalic and atraumatic.     Right Ear: Tympanic membrane normal.     Left Ear: Tympanic membrane normal.  Eyes:     Pupils: Pupils are equal, round, and reactive to light.  Neck:     Thyroid: No thyromegaly.  Cardiovascular:     Rate and Rhythm: Normal rate and regular rhythm.     Heart sounds: Normal heart sounds. No murmur heard.   Pulmonary:     Effort: Pulmonary effort is normal. No respiratory distress.     Breath sounds: Normal breath sounds. No wheezing.  Abdominal:     General: Bowel sounds are normal. There is no distension.     Palpations: Abdomen is soft.     Tenderness: There is no abdominal tenderness.  Musculoskeletal:        General: No tenderness.     Cervical back: Normal range of motion and neck supple.     Right lower  leg: Edema (2+) present.     Left lower leg: Edema (2+) present.  Skin:    General: Skin is warm and dry.  Neurological:     Mental Status: She is alert and oriented to person, place, and time.     Cranial Nerves: No cranial nerve deficit.     Motor: Weakness present.     Gait: Gait abnormal.     Deep Tendon Reflexes:  Reflexes are normal and symmetric.  Psychiatric:        Behavior: Behavior normal.        Thought Content: Thought content normal.        Judgment: Judgment normal.       BP 124/82   Pulse 67   Temp (!) 97.4 F (36.3 C) (Temporal)   Ht 5' 2" (1.575 m)   Wt 290 lb (131.5 kg)   BMI 53.04 kg/m      Assessment & Plan:  Megan [name] comes in today with chief complaint of Medical Management of Chronic Issues   Diagnosis and orders addressed:  1. UTI symptoms - Urinalysis, Complete - Urine Culture - CMP14+EGFR - CBC with Differential/Platelet - DME Wheelchair manual - For home use only DME 4 wheeled rolling walker with seat ([id number])  2. PVD (peripheral vascular disease) with claudication (HCC) - CMP14+EGFR - CBC with Differential/Platelet - DME Wheelchair manual - For home use only DME 4 wheeled rolling walker with seat ([id number])  3. Chronic diastolic congestive heart failure (HCC) - CMP14+EGFR - CBC with Differential/Platelet - DME Wheelchair manual - For home use only DME 4 wheeled rolling walker with seat ([id number])  4. Atrial fibrillation, unspecified type (HCC) - CMP14+EGFR - CBC with Differential/Platelet - DME Wheelchair manual - For home use only DME 4 wheeled rolling walker with seat ([id number])  5. Gastroesophageal reflux disease without esophagitis  - CMP14+EGFR - CBC with Differential/Platelet - DME Wheelchair manual - For home use only DME 4 wheeled rolling walker with seat ([id number])  6. Acquired hypothyroidism - CMP14+EGFR - CBC with Differential/Platelet - DME Wheelchair manual - For home use only DME 4 wheeled rolling walker with seat ([id number])  7. Morbid obesity due to excess calories (HCC) - CMP14+EGFR - CBC with Differential/Platelet - DME Wheelchair manual - For home use only DME 4 wheeled rolling walker with seat ([id number])  8. Mixed hyperlipidemia - CMP14+EGFR - CBC with Differential/Platelet - DME Wheelchair manual -  For home use only DME 4 wheeled rolling walker with seat ([id number])  9. Long term (current) use of anticoagulants  - CMP14+EGFR - CBC with Differential/Platelet - DME Wheelchair manual - For home use only DME 4 wheeled rolling walker with seat ([id number])  10. OSA (obstructive sleep apnea) - CMP14+EGFR - CBC with Differential/Platelet - For home use only DME continuous positive airway pressure (CPAP) - DME Wheelchair manual - For home use only DME 4 wheeled rolling walker with seat ([id number])  11. Prediabetes - DME Wheelchair manual - For home use only DME 4 wheeled rolling walker with seat ([id number]) - Microalbumin / creatinine urine ratio - Bayer DCA Hb A1c Waived   Labs pending Health Maintenance reviewed Diet and exercise encouraged  Follow up plan: 3 months    , FNP  

## 2020-08-15 ENCOUNTER — Other Ambulatory Visit: Payer: Self-pay | Admitting: Family

## 2020-08-15 DIAGNOSIS — B372 Candidiasis of skin and nail: Secondary | ICD-10-CM

## 2020-08-15 LAB — CBC WITH DIFFERENTIAL/PLATELET
Basophils Absolute: 0.1 10*3/uL (ref 0.0–0.2)
Basos: 0 %
EOS (ABSOLUTE): 0.1 10*3/uL (ref 0.0–0.4)
Eos: 1 %
Hematocrit: 43.3 % (ref 34.0–46.6)
Hemoglobin: 14.8 g/dL (ref 11.1–15.9)
Immature Grans (Abs): 0.1 10*3/uL (ref 0.0–0.1)
Immature Granulocytes: 1 %
Lymphocytes Absolute: 1.3 10*3/uL (ref 0.7–3.1)
Lymphs: 10 %
MCH: 31.8 pg (ref 26.6–33.0)
MCHC: 34.2 g/dL (ref 31.5–35.7)
MCV: 93 fL (ref 79–97)
Monocytes Absolute: 0.8 10*3/uL (ref 0.1–0.9)
Monocytes: 6 %
Neutrophils Absolute: 9.9 10*3/uL — ABNORMAL HIGH (ref 1.4–7.0)
Neutrophils: 82 %
Platelets: 308 10*3/uL (ref 150–450)
RBC: 4.65 x10E6/uL (ref 3.77–5.28)
RDW: 13.1 % (ref 11.7–15.4)
WBC: 12.1 10*3/uL — ABNORMAL HIGH (ref 3.4–10.8)

## 2020-08-15 LAB — CMP14+EGFR
ALT: 20 IU/L (ref 0–32)
AST: 17 IU/L (ref 0–40)
Albumin/Globulin Ratio: 1.3 (ref 1.2–2.2)
Albumin: 3.9 g/dL (ref 3.7–4.7)
Alkaline Phosphatase: 191 IU/L — ABNORMAL HIGH (ref 44–121)
BUN/Creatinine Ratio: 13 (ref 12–28)
BUN: 13 mg/dL (ref 8–27)
Bilirubin Total: 1.1 mg/dL (ref 0.0–1.2)
CO2: 20 mmol/L (ref 20–29)
Calcium: 9.2 mg/dL (ref 8.7–10.3)
Chloride: 101 mmol/L (ref 96–106)
Creatinine, Ser: 0.99 mg/dL (ref 0.57–1.00)
Globulin, Total: 3.1 g/dL (ref 1.5–4.5)
Glucose: 126 mg/dL — ABNORMAL HIGH (ref 65–99)
Potassium: 5 mmol/L (ref 3.5–5.2)
Sodium: 139 mmol/L (ref 134–144)
Total Protein: 7 g/dL (ref 6.0–8.5)
eGFR: 59 mL/min/{1.73_m2} — ABNORMAL LOW (ref >59)

## 2020-08-15 LAB — MICROALBUMIN / CREATININE URINE RATIO
Creatinine, Urine: 59.6 mg/dL
Microalb/Creat Ratio: 8 mg/g creat (ref 0–29)
Microalbumin, Urine: 5 ug/mL

## 2020-08-15 MED ORDER — NYSTATIN 100000 UNIT/GM EX POWD: Freq: Four times a day (QID) | CUTANEOUS | 0 refills | Status: DC

## 2020-08-15 MED ORDER — CLOTRIMAZOLE-BETAMETHASONE 1-0.05 % EX CREA: 1.0000 "application " | TOPICAL_CREAM | Freq: Two times a day (BID) | CUTANEOUS | 1 refills | Status: DC

## 2020-08-15 MED ORDER — FLUCONAZOLE 150 MG PO TABS: 150.0000 mg | ORAL_TABLET | ORAL | 0 refills | Status: DC | PRN

## 2020-08-15 NOTE — Progress Notes (Signed)
Lotrisone cream Prescription sent to pharmacy  And rx of nystatin powder. Keep area clean and dry.

## 2020-08-15 NOTE — Progress Notes (Signed)
Patient aware.

## 2020-08-18 ENCOUNTER — Telehealth: Payer: Self-pay | Admitting: Family

## 2020-08-18 ENCOUNTER — Other Ambulatory Visit: Payer: Self-pay | Admitting: Family

## 2020-08-18 DIAGNOSIS — N3281 Overactive bladder: Secondary | ICD-10-CM

## 2020-08-18 DIAGNOSIS — I890 Lymphedema, not elsewhere classified: Secondary | ICD-10-CM

## 2020-08-18 MED ORDER — CEPHALEXIN 500 MG PO CAPS: 500.0000 mg | ORAL_CAPSULE | Freq: Two times a day (BID) | ORAL | 0 refills | Status: DC

## 2020-08-18 NOTE — Telephone Encounter (Signed)
Prescription sent to pharmacy.

## 2020-08-18 NOTE — Telephone Encounter (Signed)
Please sign pending order, then when printed bring to Rx/HH office to be faxed to [facility]

## 2020-08-19 ENCOUNTER — Telehealth: Payer: Self-pay | Admitting: Family

## 2020-08-19 NOTE — Telephone Encounter (Signed)
Patient aware and verbalized understanding. °

## 2020-08-19 NOTE — Telephone Encounter (Signed)
Pt has a question about labs

## 2020-08-20 LAB — URINE CULTURE

## 2020-08-21 ENCOUNTER — Ambulatory Visit (INDEPENDENT_AMBULATORY_CARE_PROVIDER_SITE_OTHER): Payer: Medicare Other | Admitting: *Deleted

## 2020-08-21 DIAGNOSIS — Z5181 Encounter for therapeutic drug level monitoring: Secondary | ICD-10-CM

## 2020-08-21 DIAGNOSIS — I4891 Unspecified atrial fibrillation: Secondary | ICD-10-CM | POA: Diagnosis not present

## 2020-08-21 LAB — POCT INR: INR: 2.6 (ref 2.0–3.0)

## 2020-08-21 NOTE — Patient Instructions (Signed)
Continue warfarin 1 tablet daily Continue greens Recheck in 4 weeks 

## 2020-09-01 ENCOUNTER — Telehealth: Payer: Self-pay | Admitting: Family

## 2020-09-01 NOTE — Telephone Encounter (Signed)
rc for nurse 

## 2020-09-01 NOTE — Telephone Encounter (Signed)
Pt says that she has not gotten the wheelchair walker bedside commode and cpap machine. Please fax to [place] call pt when done

## 2020-09-01 NOTE — Telephone Encounter (Signed)
Lmtcb.

## 2020-09-01 NOTE — Telephone Encounter (Signed)
Patient someone told her to call here because they faxed all her DME stuff her for what they needed have you seen anything on her?

## 2020-09-01 NOTE — Telephone Encounter (Signed)
Faxed to [place]

## 2020-09-02 NOTE — Telephone Encounter (Signed)
Refaxed order for wheelchair, bedside commode, 4 wheeled rolling walker,  & Cpap to [place] notes. Gave pt their number [phone] as well.

## 2020-09-12 ENCOUNTER — Telehealth: Payer: Self-pay | Admitting: Family

## 2020-09-12 MED ORDER — ALBUTEROL SULFATE HFA 108 (90 BASE) MCG/ACT IN AERS: 2.0000 | INHALATION_SPRAY | Freq: Four times a day (QID) | RESPIRATORY_TRACT | 1 refills | Status: DC | PRN

## 2020-09-12 NOTE — Telephone Encounter (Signed)
ALREADY SENT IN

## 2020-09-12 NOTE — Telephone Encounter (Signed)
  Prescription Request  09/12/2020  What is the name of the medication or equipment? Albuterol  Have you contacted your pharmacy to request a refill? (if applicable) yes  Which pharmacy would you like this sent to? [facility]   Patient notified that their request is being sent to the clinical staff for review and that they should receive a response within 2 business days.    Lenna Gilford' pt.

## 2020-09-16 ENCOUNTER — Telehealth: Payer: Self-pay | Admitting: Family

## 2020-09-16 NOTE — Telephone Encounter (Signed)
Pt was wanting to know about her CPAP order. I had talked to [employer] before calling pt her wheelchair will be delivered today but the rep said there was 'no activity' on her CPAP order, he did not say there was no order just there looks to be anything done on it

## 2020-09-18 ENCOUNTER — Ambulatory Visit (INDEPENDENT_AMBULATORY_CARE_PROVIDER_SITE_OTHER): Payer: Medicare Other | Admitting: *Deleted

## 2020-09-18 ENCOUNTER — Other Ambulatory Visit: Payer: Self-pay

## 2020-09-18 DIAGNOSIS — I4891 Unspecified atrial fibrillation: Secondary | ICD-10-CM

## 2020-09-18 DIAGNOSIS — Z5181 Encounter for therapeutic drug level monitoring: Secondary | ICD-10-CM | POA: Diagnosis not present

## 2020-09-18 LAB — POCT INR: INR: 2.7 (ref 2.0–3.0)

## 2020-09-18 NOTE — Patient Instructions (Signed)
Take warfarin 1/2 tablet on Friday then resume 1 tablet daily  Continue greens Recheck in 3 weeks

## 2020-09-23 ENCOUNTER — Other Ambulatory Visit: Payer: Self-pay | Admitting: Family

## 2020-09-23 DIAGNOSIS — G4733 Obstructive sleep apnea (adult) (pediatric): Secondary | ICD-10-CM

## 2020-09-24 ENCOUNTER — Other Ambulatory Visit: Payer: Self-pay | Admitting: Family

## 2020-09-24 DIAGNOSIS — E039 Hypothyroidism, unspecified: Secondary | ICD-10-CM

## 2020-09-25 NOTE — Telephone Encounter (Signed)
CPAP is still being worked on. Pt has not been wearing, has been sitting up & been doing ok that way but recently been SOB but can tolerate, says her dreams wake her up. Per [employer], if she has been non-compliant she will need another sleep study. Alyse Low has placed a referral for the pt to have another sleep study done, but pt does not think she can tolerate going away and doing another sleep study. Maybe whoever she goes to can do a home sleep study. Faxing old sleep study to [place] with this note as well.

## 2020-09-28 DIAGNOSIS — R059 Cough, unspecified: Secondary | ICD-10-CM | POA: Diagnosis not present

## 2020-09-28 DIAGNOSIS — R0981 Nasal congestion: Secondary | ICD-10-CM | POA: Diagnosis not present

## 2020-09-28 DIAGNOSIS — J329 Chronic sinusitis, unspecified: Secondary | ICD-10-CM | POA: Diagnosis not present

## 2020-09-30 ENCOUNTER — Other Ambulatory Visit: Payer: Self-pay

## 2020-09-30 ENCOUNTER — Encounter (HOSPITAL_COMMUNITY): Payer: Self-pay | Admitting: Emergency Medicine

## 2020-09-30 ENCOUNTER — Emergency Department (HOSPITAL_COMMUNITY)
Admission: EM | Admit: 2020-09-30 | Discharge: 2020-10-01 | Disposition: A | Discharge status: Home / Self Care | Payer: Medicare Other | Attending: Emergency Medicine | Admitting: Emergency Medicine

## 2020-09-30 ENCOUNTER — Telehealth: Payer: Self-pay | Admitting: Family

## 2020-09-30 ENCOUNTER — Telehealth: Payer: Self-pay | Admitting: Internal Medicine

## 2020-09-30 DIAGNOSIS — R42 Dizziness and giddiness: Secondary | ICD-10-CM | POA: Diagnosis not present

## 2020-09-30 DIAGNOSIS — Z20822 Contact with and (suspected) exposure to covid-19: Secondary | ICD-10-CM | POA: Diagnosis not present

## 2020-09-30 DIAGNOSIS — L304 Erythema intertrigo: Secondary | ICD-10-CM | POA: Insufficient documentation

## 2020-09-30 DIAGNOSIS — I110 Hypertensive heart disease with heart failure: Secondary | ICD-10-CM | POA: Insufficient documentation

## 2020-09-30 DIAGNOSIS — I4819 Other persistent atrial fibrillation: Secondary | ICD-10-CM | POA: Diagnosis not present

## 2020-09-30 DIAGNOSIS — E039 Hypothyroidism, unspecified: Secondary | ICD-10-CM | POA: Insufficient documentation

## 2020-09-30 DIAGNOSIS — I5031 Acute diastolic (congestive) heart failure: Secondary | ICD-10-CM | POA: Diagnosis not present

## 2020-09-30 DIAGNOSIS — Z950 Presence of cardiac pacemaker: Secondary | ICD-10-CM | POA: Insufficient documentation

## 2020-09-30 DIAGNOSIS — Z79899 Other long term (current) drug therapy: Secondary | ICD-10-CM | POA: Insufficient documentation

## 2020-09-30 DIAGNOSIS — R0602 Shortness of breath: Secondary | ICD-10-CM | POA: Diagnosis not present

## 2020-09-30 DIAGNOSIS — Z87891 Personal history of nicotine dependence: Secondary | ICD-10-CM | POA: Insufficient documentation

## 2020-09-30 DIAGNOSIS — I517 Cardiomegaly: Secondary | ICD-10-CM | POA: Diagnosis not present

## 2020-09-30 DIAGNOSIS — J40 Bronchitis, not specified as acute or chronic: Secondary | ICD-10-CM | POA: Insufficient documentation

## 2020-09-30 DIAGNOSIS — Z7901 Long term (current) use of anticoagulants: Secondary | ICD-10-CM | POA: Diagnosis not present

## 2020-09-30 NOTE — Telephone Encounter (Signed)
Busy x 2. 

## 2020-09-30 NOTE — Telephone Encounter (Signed)
Patient called requesting to speak with Dr. Jackalyn Lombard nurse.  States that Dr. Rayann Heman ordered a sleep study on her in the past. She states that she needs to have another CPAP machine because she continues to wake up in the night where she is having issues without being able to breath.

## 2020-09-30 NOTE — Telephone Encounter (Signed)
Attempted to reach pt and line was busy. Will attempt again.

## 2020-09-30 NOTE — ED Triage Notes (Signed)
Pt c/o increased sob since Saturday and was seen at urgent care for the same yesterday. Pt states she has not been using her cpap.

## 2020-10-01 ENCOUNTER — Emergency Department (HOSPITAL_COMMUNITY): Payer: Medicare Other

## 2020-10-01 DIAGNOSIS — R0602 Shortness of breath: Secondary | ICD-10-CM | POA: Diagnosis not present

## 2020-10-01 DIAGNOSIS — I517 Cardiomegaly: Secondary | ICD-10-CM | POA: Diagnosis not present

## 2020-10-01 DIAGNOSIS — R42 Dizziness and giddiness: Secondary | ICD-10-CM | POA: Diagnosis not present

## 2020-10-01 DIAGNOSIS — J40 Bronchitis, not specified as acute or chronic: Secondary | ICD-10-CM | POA: Diagnosis not present

## 2020-10-01 LAB — BASIC METABOLIC PANEL
Anion gap: 8 (ref 5–15)
BUN: 15 mg/dL (ref 8–23)
CO2: 25 mmol/L (ref 22–32)
Calcium: 8.6 mg/dL — ABNORMAL LOW (ref 8.9–10.3)
Chloride: 104 mmol/L (ref 98–111)
Creatinine, Ser: 0.93 mg/dL (ref 0.44–1.00)
GFR, Estimated: >60 mL/min (ref >60)
Glucose, Bld: 134 mg/dL — ABNORMAL HIGH (ref 70–99)
Potassium: 4.5 mmol/L (ref 3.5–5.1)
Sodium: 137 mmol/L (ref 135–145)

## 2020-10-01 LAB — CBC WITH DIFFERENTIAL/PLATELET
Abs Immature Granulocytes: 0.1 10*3/uL — ABNORMAL HIGH (ref 0.00–0.07)
Basophils Absolute: 0.1 10*3/uL (ref 0.0–0.1)
Basophils Relative: 1 %
Eosinophils Absolute: 0.1 10*3/uL (ref 0.0–0.5)
Eosinophils Relative: 1 %
HCT: 44.4 % (ref 36.0–46.0)
Hemoglobin: 14.2 g/dL (ref 12.0–15.0)
Immature Granulocytes: 1 %
Lymphocytes Relative: 12 %
Lymphs Abs: 1.3 10*3/uL (ref 0.7–4.0)
MCH: 32.1 pg (ref 26.0–34.0)
MCHC: 32 g/dL (ref 30.0–36.0)
MCV: 100.5 fL — ABNORMAL HIGH (ref 80.0–100.0)
Monocytes Absolute: 1 10*3/uL (ref 0.1–1.0)
Monocytes Relative: 10 %
Neutro Abs: 7.8 10*3/uL — ABNORMAL HIGH (ref 1.7–7.7)
Neutrophils Relative %: 75 %
Platelets: 207 10*3/uL (ref 150–400)
RBC: 4.42 MIL/uL (ref 3.87–5.11)
RDW: 14 % (ref 11.5–15.5)
WBC: 10.4 10*3/uL (ref 4.0–10.5)
nRBC: 0 % (ref 0.0–0.2)

## 2020-10-01 LAB — RESP PANEL BY RT-PCR (FLU A&B, COVID) ARPGX2
Influenza A by PCR: NEGATIVE
Influenza B by PCR: NEGATIVE
SARS Coronavirus 2 by RT PCR: NEGATIVE

## 2020-10-01 LAB — BRAIN NATRIURETIC PEPTIDE: B Natriuretic Peptide: 158 pg/mL — ABNORMAL HIGH (ref 0.0–100.0)

## 2020-10-01 MED ORDER — NYSTATIN 100000 UNIT/GM EX CREA: TOPICAL_CREAM | Freq: Three times a day (TID) | CUTANEOUS | 0 refills | Status: DC

## 2020-10-01 MED ORDER — DEXAMETHASONE SODIUM PHOSPHATE 10 MG/ML IJ SOLN
10.0000 mg | Freq: Once | INTRAMUSCULAR | Status: AC
  Administered 2020-10-01: 10 mg via INTRAVENOUS
  Filled 2020-10-01: qty 1

## 2020-10-01 MED ORDER — PREDNISONE 20 MG PO TABS: 40.0000 mg | ORAL_TABLET | Freq: Every day | ORAL | 0 refills | Status: DC

## 2020-10-01 MED ORDER — IPRATROPIUM-ALBUTEROL 0.5-2.5 (3) MG/3ML IN SOLN
3.0000 mL | Freq: Once | RESPIRATORY_TRACT | Status: AC
  Administered 2020-10-01: 3 mL via RESPIRATORY_TRACT
  Filled 2020-10-01: qty 3

## 2020-10-01 MED ORDER — ALBUTEROL SULFATE (2.5 MG/3ML) 0.083% IN NEBU
2.5000 mg | INHALATION_SOLUTION | Freq: Once | RESPIRATORY_TRACT | Status: AC
  Administered 2020-10-01: 2.5 mg via RESPIRATORY_TRACT
  Filled 2020-10-01: qty 3

## 2020-10-01 NOTE — Telephone Encounter (Signed)
Busy

## 2020-10-01 NOTE — ED Provider Notes (Signed)
[facility] Provider Note   CSN: [id number] Arrival date & time: 09/30/20  2324     History Chief Complaint  Patient presents with   Shortness of Breath    Megan [name] is a 75 y.o. female.  Patient presents to the emergency department for evaluation of shortness of breath.  Patient reports that she has been feeling poorly for about 4 days.  She has developed a dry cough and increasing shortness of breath.  She was seen at urgent care 3 days ago and was placed on an antibiotic.  She has not improved.  She is also using albuterol inhaler.      Past Medical History:  Diagnosis Date   Acute diastolic heart failure ([place])    Anxiety state, unspecified    Atrial fibrillation (H[facility])    Congestive heart failure, unspecified    Degenerative joint disease    GERD (gastroesophageal reflux disease)    Hyperlipidemia    Hypertension    Lymphedema    Obesity    OSA (obstructive sleep apnea) 06/09/2015   Persistent atrial fibrillation ([place])     post-termination pauses with afib s/p PPM   Presence of permanent cardiac pacemaker    Sinoatrial node dysfunction ([place])    s/p PPM   Sleep apnea    compliant with CPAP   Unspecified hypothyroidism    Unspecified venous (peripheral) insufficiency     Patient Active Problem List   Diagnosis Date Noted   Chronic congestive heart failure ([place]) 08/24/2019   Intertrigo 05/11/2017   OAB (overactive bladder) 02/22/2017   Lymphedema 01/26/2017   Gastroesophageal reflux disease without esophagitis 01/26/2017   Acquired hypothyroidism 01/26/2017   Mixed hyperlipidemia 01/26/2017   Pyrexia    Sleeps in sitting position due to orthopnea 07/31/2015   Morbid obesity due to excess calories ([place]) 06/09/2015   PVD (peripheral vascular disease) with claudication ([place]) 06/09/2015   Long term (current) use of anticoagulants 06/19/2010   Tachycardia-bradycardia syndrome ([place]) 11/18/2008   PPM-[place] 11/18/2008   HYPERTENSION,  BENIGN 02/21/2008   ATRIAL FIBRILLATION 02/21/2008    Past Surgical History:  Procedure Laterality Date   CATARACT EXTRACTION W/PHACO Left 09/09/2014   Procedure: CATARACT EXTRACTION PHACO AND INTRAOCULAR LENS PLACEMENT ([place]);  Surgeon: Tonny Branch, MD;  Location: [facility];  Service: Ophthalmology;  Laterality: Left;  CDE: 6.99   CATARACT EXTRACTION W/PHACO Right 10/07/2014   Procedure: CATARACT EXTRACTION PHACO AND INTRAOCULAR LENS PLACEMENT RIGHT EYE;  Surgeon: Tonny Branch, MD;  Location: [facility];  Service: Ophthalmology;  Laterality: Right;  CDE:5.16   PACEMAKER INSERTION     SJM by JA     OB History   No obstetric history on file.     Family History  Problem Relation Age of Onset   Parkinson's disease Other    CVA Other    Parkinson's disease Father     Social History   Tobacco Use   Smoking status: Former    Packs/day: 0.50    Years: 10.00    Pack years: 5.00    Types: Cigarettes    Quit date: 03/29/1978    Years since quitting: 42.5   Smokeless tobacco: Never  Vaping Use   Vaping Use: Never used  Substance Use Topics   Alcohol use: No    Alcohol/week: 0.0 standard drinks   Drug use: No    Home Medications Prior to Admission medications   Medication Sig Start Date End Date Taking? Authorizing Provider  cephALEXin (KEFLEX) 500 MG  capsule Take 1 capsule (500 mg total) by mouth 2 (two) times daily. 08/18/20   Evelina Dun A, FNP  nystatin cream (MYCOSTATIN) Apply topically 3 (three) times daily. Apply to affected area 3 times daily 10/01/20  Yes Tag Wurtz, Gwenyth Allegra, MD  predniSONE (DELTASONE) 20 MG tablet Take 2 tablets (40 mg total) by mouth daily with breakfast. 10/01/20  Yes Ady Heimann, Gwenyth Allegra, MD  albuterol (VENTOLIN HFA) 108 (90 Base) MCG/ACT inhaler Inhale 2 puffs into the lungs every 6 (six) hours as needed for wheezing or shortness of breath. 09/12/20   Sharion Balloon, FNP  atorvastatin (LIPITOR) 80 MG tablet Take 1 tablet (80 mg total) by mouth daily.  02/12/20   Sharion Balloon, FNP  blood glucose meter kit and supplies Dispense based on patient and insurance preference. Use up to four times daily as directed. (FOR ICD-10 E10.9, E11.9). 06/09/20   Sharion Balloon, FNP  clotrimazole-betamethasone (LOTRISONE) cream Apply 1 application topically 2 (two) times daily. 08/15/20   Sharion Balloon, FNP  diltiazem (CARDIZEM CD) 240 MG 24 hr capsule Take 1 capsule (240 mg total) by mouth daily. 02/12/20   Sharion Balloon, FNP  esomeprazole (NEXIUM) 40 MG capsule Take 1 capsule (40 mg total) by mouth daily. 02/12/20   Sharion Balloon, FNP  EUTHYROX 150 MCG tablet TAKE 1 TABLET BY MOUTH ONCE DAILY BEFORE BREAKFAST 09/24/20   Evelina Dun A, FNP  fluconazole (DIFLUCAN) 150 MG tablet Take 1 tablet (150 mg total) by mouth every three (3) days as needed. 08/15/20   Sharion Balloon, FNP  metoprolol tartrate (LOPRESSOR) 100 MG tablet Take 1/2 (one-half) tablet by mouth twice daily 02/12/20   Evelina Dun A, FNP  metroNIDAZOLE (METROGEL) 1 % gel Apply topically daily. 12/11/18   Hassell Done, Mary-Margaret, FNP  silver sulfADIAZINE (SILVADENE) 1 % cream Apply 1 application topically daily. 04/10/20   Sharion Balloon, FNP  Vitamin D, Ergocalciferol, (DRISDOL) 1.25 MG (50000 UNIT) CAPS capsule Take 1 capsule (50,000 Units total) by mouth every 7 (seven) days. 05/22/20   Sharion Balloon, FNP  warfarin (COUMADIN) 3 MG tablet Take 1 tablet daily except 1/2 tablet on Saturdays or as directed 06/23/20   Thompson Grayer, MD    Allergies    Patient has no known allergies.  Review of Systems   Review of Systems  Respiratory:  Positive for cough and shortness of breath.   All other systems reviewed and are negative.  Physical Exam Updated Vital Signs BP (!) 143/74   Pulse 87   Temp 98.4 F (36.9 C) (Oral)   Resp 16   Ht '5\' 2"'  (1.575 m)   Wt 132 kg   SpO2 96%   BMI 53.23 kg/m   Physical Exam Vitals and nursing note reviewed.  Constitutional:      General: She  is not in acute distress.    Appearance: Normal appearance. She is well-developed.  HENT:     Head: Normocephalic and atraumatic.     Right Ear: Hearing normal.     Left Ear: Hearing normal.     Nose: Nose normal.  Eyes:     Conjunctiva/sclera: Conjunctivae normal.     Pupils: Pupils are equal, round, and reactive to light.  Cardiovascular:     Rate and Rhythm: Regular rhythm.     Heart sounds: S1 normal and S2 normal. No murmur heard.   No friction rub. No gallop.  Pulmonary:     Effort: Pulmonary effort is normal. No  respiratory distress.     Breath sounds: Normal breath sounds.  Chest:     Chest wall: No tenderness.  Abdominal:     General: Bowel sounds are normal.     Palpations: Abdomen is soft.     Tenderness: There is no abdominal tenderness. There is no guarding or rebound. Negative signs include Murphy's sign and McBurney's sign.     Hernia: No hernia is present.  Musculoskeletal:        General: Normal range of motion.     Cervical back: Normal range of motion and neck supple.  Skin:    General: Skin is warm and dry.     Findings: No rash.     Comments: Significant candidal infection under both breasts and stomach pannus  Neurological:     Mental Status: She is alert and oriented to person, place, and time.     GCS: GCS eye subscore is 4. GCS verbal subscore is 5. GCS motor subscore is 6.     Cranial Nerves: No cranial nerve deficit.     Sensory: No sensory deficit.     Coordination: Coordination normal.  Psychiatric:        Speech: Speech normal.        Behavior: Behavior normal.        Thought Content: Thought content normal.    ED Results / Procedures / Treatments   Labs (all labs ordered are listed, but only abnormal results are displayed) Labs Reviewed  CBC WITH DIFFERENTIAL/PLATELET - Abnormal; Notable for the following components:      Result Value   MCV 100.5 (*)    Neutro Abs 7.8 (*)    Abs Immature Granulocytes 0.10 (*)    All other components  within normal limits  BASIC METABOLIC PANEL - Abnormal; Notable for the following components:   Glucose, Bld 134 (*)    Calcium 8.6 (*)    All other components within normal limits  BRAIN NATRIURETIC PEPTIDE - Abnormal; Notable for the following components:   B Natriuretic Peptide 158.0 (*)    All other components within normal limits  RESP PANEL BY RT-PCR (FLU A&B, COVID) [id number]    EKG EKG Interpretation  Date/Time:  Tuesday September 30 2020 23:42:59 EDT Ventricular Rate:  90 PR Interval:    QRS Duration: 101 QT Interval:  399 QTC Calculation: 443 R Axis:   90 Text Interpretation: Atrial fibrillation Borderline right axis deviation Low voltage, extremity and precordial leads Nonspecific repol abnormality, diffuse leads Baseline wander in lead(s) V2 Confirmed by Orpah Greek [phone]) on 09/30/2020 11:57:35 PM  Radiology DG Chest Port 1 View  Result Date: 10/01/2020 CLINICAL DATA:  Shortness of breath EXAM: PORTABLE CHEST 1 VIEW COMPARISON:  04/15/2020 FINDINGS: Lungs are clear.  No pleural effusion or pneumothorax. Cardiomegaly.  Left subclavian pacemaker. IMPRESSION: Cardiomegaly. No evidence of acute cardiopulmonary disease. Electronically Signed   By: Julian Hy M.D.   On: 10/01/2020 00:24    Procedures Procedures   Medications Ordered in ED Medications  ipratropium-albuterol (DUONEB) 0.5-2.5 (3) MG/3ML nebulizer solution 3 mL (3 mLs Nebulization Given 10/01/20 0211)  albuterol (PROVENTIL) (2.5 MG/3ML) 0.083% nebulizer solution 2.5 mg (2.5 mg Nebulization Given 10/01/20 0211)  dexamethasone (DECADRON) injection 10 mg (10 mg Intravenous Given 10/01/20 0206)    ED Course  I have reviewed the triage vital signs and the nursing notes.  Pertinent labs & imaging results that were available during my care of the patient were reviewed by me and considered in  my medical decision making (see chart for details).    MDM Rules/Calculators/A&P                          Patient  presents with shortness of breath.  She reports a history of recurrent bronchitis with similar symptoms in the past.  She was started on an antibiotic at urgent care but has not improved.  Oxygen saturation is 96 to 98% on room air here in the ER.  Lungs are clear, no active wheezing.  Chest x-ray without evidence of pneumonia or congestive heart failure.  Patient administered nebulizer treatments here in the emergency department.  She has remained well-appearing with no hypoxia.  Can discharge with prednisone in addition to bronchodilator therapy.  Prednisone certainly could worsen the intertrigo.  It appears that she was on nystatin powder in the past.  The rash is not particularly wet and I suspect that it is not helping much, changed to cream.  Final Clinical Impression(s) / ED Diagnoses Final diagnoses:  Bronchitis  Intertrigo    Rx / DC Orders ED Discharge Orders          Ordered    nystatin cream (MYCOSTATIN)  3 times daily        10/01/20 0203    predniSONE (DELTASONE) 20 MG tablet  Daily with breakfast        10/01/20 0232             Orpah Greek, MD 10/01/20 [phone]

## 2020-10-01 NOTE — Telephone Encounter (Signed)
I the patient calls back there has been a referral placed or sleep studies

## 2020-10-01 NOTE — Telephone Encounter (Signed)
Pt made aware that Serita Sheller, [place] office has placed order for sleep study. Pt verbalized understanding.

## 2020-10-07 ENCOUNTER — Other Ambulatory Visit: Payer: Self-pay

## 2020-10-07 ENCOUNTER — Encounter: Payer: Self-pay | Admitting: Family

## 2020-10-07 ENCOUNTER — Ambulatory Visit (INDEPENDENT_AMBULATORY_CARE_PROVIDER_SITE_OTHER): Payer: Medicare Other | Admitting: Family

## 2020-10-07 VITALS — BP 123/71 | HR 68 | Temp 97.3°F | Ht 62.0 in

## 2020-10-07 DIAGNOSIS — J209 Acute bronchitis, unspecified: Secondary | ICD-10-CM

## 2020-10-07 DIAGNOSIS — E119 Type 2 diabetes mellitus without complications: Secondary | ICD-10-CM | POA: Insufficient documentation

## 2020-10-07 DIAGNOSIS — E039 Hypothyroidism, unspecified: Secondary | ICD-10-CM

## 2020-10-07 DIAGNOSIS — E1169 Type 2 diabetes mellitus with other specified complication: Secondary | ICD-10-CM | POA: Diagnosis not present

## 2020-10-07 DIAGNOSIS — R0602 Shortness of breath: Secondary | ICD-10-CM | POA: Diagnosis not present

## 2020-10-07 DIAGNOSIS — I890 Lymphedema, not elsewhere classified: Secondary | ICD-10-CM

## 2020-10-07 DIAGNOSIS — Z09 Encounter for follow-up examination after completed treatment for conditions other than malignant neoplasm: Secondary | ICD-10-CM | POA: Diagnosis not present

## 2020-10-07 NOTE — Patient Instructions (Signed)
Sleep Apnea Sleep apnea is a condition in which breathing pauses or becomes shallow during sleep. People with sleep apnea usually snore loudly. They may have times when they gasp and stop breathing for 10 seconds or more during sleep. This mayhappen many times during the night. Sleep apnea disrupts your sleep and keeps your body from getting the rest that it needs. This condition can increase your risk of certain health problems, including: Heart attack. Stroke. Obesity. Type 2 diabetes. Heart failure. Irregular heartbeat. High blood pressure. The goal of treatment is to help you breathe normally again. What are the causes? The most common cause of sleep apnea is a collapsed or blocked airway. There are three kinds of sleep apnea: Obstructive sleep apnea. This kind is caused by a blocked or collapsed airway. Central sleep apnea. This kind happens when the part of the brain that controls breathing does not send the correct signals to the muscles that control breathing. Mixed sleep apnea. This is a combination of obstructive and central sleep apnea. What increases the risk? You are more likely to develop this condition if you: Are overweight. Smoke. Have a smaller than normal airway. Are older. Are female. Drink alcohol. Take sedatives or tranquilizers. Have a family history of sleep apnea. Have a tongue or tonsils that are larger than normal. What are the signs or symptoms? Symptoms of this condition include: Trouble staying asleep. Loud snoring. Morning headaches. Waking up gasping. Dry mouth or sore throat in the morning. Daytime sleepiness and tiredness. If you have daytime fatigue because of sleep apnea, you may be more likely to have: Trouble concentrating. Forgetfulness. Irritability or mood swings. Personality changes. Feelings of depression. Sexual dysfunction. This may include loss of interest if you are female, or erectile dysfunction if you are female. How is this  diagnosed? This condition may be diagnosed with: A medical history. A physical exam. A series of tests that are done while you are sleeping (sleep study). These tests are usually done in a sleep lab, but they may also be done at home. How is this treated? Treatment for this condition aims to restore normal breathing and to ease symptoms during sleep. It may involve managing health issues that can affect breathing, such as high blood pressure or obesity. Treatment may include: Sleeping on your side. Using a decongestant if you have nasal congestion. Avoiding the use of depressants, including alcohol, sedatives, and narcotics. Losing weight if you are overweight. Making changes to your diet. Quitting smoking. Using a device to open your airway while you sleep, such as: An oral appliance. This is a custom-made mouthpiece that shifts your lower jaw forward. A continuous positive airway pressure (CPAP) device. This device blows air through a mask when you breathe out (exhale). A nasal expiratory positive airway pressure (EPAP) device. This device has valves that you put into each nostril. A bi-level positive airway pressure (BPAP) device. This device blows air through a mask when you breathe in (inhale) and breathe out (exhale). Having surgery if other treatments do not work. During surgery, excess tissue is removed to create a wider airway. Follow these instructions at home: Lifestyle Make any lifestyle changes that your health care provider recommends. Eat a healthy, well-balanced diet. Take steps to lose weight if you are overweight. Avoid using depressants, including alcohol, sedatives, and narcotics. Do not use any products that contain nicotine or tobacco. These products include cigarettes, chewing tobacco, and vaping devices, such as e-cigarettes. If you need help quitting, ask your health  care provider. General instructions Take over-the-counter and prescription medicines only as told  by your health care provider. If you were given a device to open your airway while you sleep, use it only as told by your health care provider. If you are having surgery, make sure to tell your health care provider you have sleep apnea. You may need to bring your device with you. Keep all follow-up visits. This is important. Contact a health care provider if: The device that you received to open your airway during sleep is uncomfortable or does not seem to be working. Your symptoms do not improve. Your symptoms get worse. Get help right away if: You develop: Chest pain. Shortness of breath. Discomfort in your back, arms, or stomach. You have: Trouble speaking. Weakness on one side of your body. Drooping in your face. These symptoms may represent a serious problem that is an emergency. Do not wait to see if the symptoms will go away. Get medical help right away. Call your local emergency services (911 in the [place] Do not drive yourself to the hospital. Summary Sleep apnea is a condition in which breathing pauses or becomes shallow during sleep. The most common cause is a collapsed or blocked airway. The goal of treatment is to restore normal breathing and to ease symptoms during sleep. This information is not intended to replace advice given to you by your health care provider. Make sure you discuss any questions you have with your healthcare provider. Document Revised: 02/22/2020 Document Reviewed: 02/22/2020 Elsevier Patient Education  2022 [employer].

## 2020-10-07 NOTE — Progress Notes (Signed)
Subjective:    Patient ID: Megan [name], female    DOB: Jun 12, 1945, 75 y.o.   MRN: [id number]  Chief Complaint  Patient presents with   Follow-up    [facility] for [facility]    Hospitalization Follow-up    Megan [name]    Pt presents to the office today for hospital follow up. She went to the ED on 10/01/20 for cough and SOB and  was diagnosed with bronchitis. She was given steroids and nebulizer treatment. She reports she is doing much better. She reports since taking the steroids her appetite  is been increased. She ran out of test strips.   She is also complaining of waking up gasping for air. She has a referral pending to a sleep study.  Diabetes She presents for her follow-up diabetic visit. She has type 2 diabetes mellitus. Pertinent negatives for diabetes include no blurred vision, no fatigue and no foot paresthesias. Symptoms are stable. Risk factors for coronary artery disease include dyslipidemia, diabetes mellitus, hypertension, sedentary lifestyle and post-menopausal. She is following a generally unhealthy diet. (Has not been checking at home)  Thyroid Problem Presents for follow-up visit. Symptoms include weight gain. Patient reports no fatigue, hair loss or hoarse voice.     Review of Systems  Constitutional:  Positive for weight gain. Negative for fatigue.  HENT:  Negative for hoarse voice.   Eyes:  Negative for blurred vision.  Respiratory:  Positive for shortness of breath.   All other systems reviewed and are negative.     Objective:   Physical Exam Vitals reviewed.  Constitutional:      General: She is not in acute distress.    Appearance: She is well-developed. She is obese.  HENT:     Head: Normocephalic and atraumatic.     Right Ear: Tympanic membrane normal.     Left Ear: Tympanic membrane normal.  Eyes:     Pupils: Pupils are equal, round, and reactive to light.  Neck:     Thyroid: No thyromegaly.  Cardiovascular:     Rate and Rhythm:  Normal rate and regular rhythm.     Heart sounds: Normal heart sounds. No murmur heard. Pulmonary:     Effort: Pulmonary effort is normal. No respiratory distress.     Breath sounds: Normal breath sounds. No wheezing.  Abdominal:     General: Bowel sounds are normal. There is no distension.     Palpations: Abdomen is soft.     Tenderness: There is no abdominal tenderness.  Musculoskeletal:        General: No tenderness.     Cervical back: Normal range of motion and neck supple.     Right lower leg: Edema present.     Left lower leg: Edema present.  Skin:    General: Skin is warm and dry.  Neurological:     Mental Status: She is alert and oriented to person, place, and time.     Cranial Nerves: No cranial nerve deficit.     Motor: Weakness (in wheelchair) present.     Gait: Gait abnormal.     Deep Tendon Reflexes: Reflexes are normal and symmetric.  Psychiatric:        Behavior: Behavior normal.        Thought Content: Thought content normal.        Judgment: Judgment normal.      BP 123/71   Pulse 68   Temp (!) 97.3 F (36.3 C) (Temporal)   Ht  5' 2" (1.575 m)   SpO2 97%   BMI 53.23 kg/m      Assessment & Plan:  Megan [name] comes in today with chief complaint of Follow-up ([facility] for congestion ) and Hospitalization Follow-up ([place] )   Diagnosis and orders addressed:  1. Hospital discharge follow-up - CMP14+EGFR - CBC with Differential/Platelet  2. Acute bronchitis, unspecified organism - CMP14+EGFR - CBC with Differential/Platelet  3. Morbid obesity due to excess calories ([facility]) - CMP14+EGFR - CBC with Differential/Platelet  4. Type 2 diabetes mellitus with other specified complication, without long-term current use of insulin (HCC) - CMP14+EGFR - CBC with Differential/Platelet  5. Lymphedema - CMP14+EGFR - CBC with Differential/Platelet  6. Hypothyroidism, unspecified type - CMP14+EGFR - CBC with Differential/Platelet -  TSH  7. SOB (shortness of breath)   Labs pending Health Maintenance reviewed Diet and exercise encouraged  Follow up plan:  3 months for chronic follow up.     , FNP   

## 2020-10-08 ENCOUNTER — Ambulatory Visit (INDEPENDENT_AMBULATORY_CARE_PROVIDER_SITE_OTHER): Payer: Medicare Other | Admitting: *Deleted

## 2020-10-08 DIAGNOSIS — I4891 Unspecified atrial fibrillation: Secondary | ICD-10-CM

## 2020-10-08 DIAGNOSIS — Z5181 Encounter for therapeutic drug level monitoring: Secondary | ICD-10-CM

## 2020-10-08 LAB — CBC WITH DIFFERENTIAL/PLATELET
Basophils Absolute: 0.1 10*3/uL (ref 0.0–0.2)
Basos: 1 %
EOS (ABSOLUTE): 0.1 10*3/uL (ref 0.0–0.4)
Eos: 1 %
Hematocrit: 44.2 % (ref 34.0–46.6)
Hemoglobin: 14.9 g/dL (ref 11.1–15.9)
Immature Grans (Abs): 0.1 10*3/uL (ref 0.0–0.1)
Immature Granulocytes: 1 %
Lymphocytes Absolute: 1.4 10*3/uL (ref 0.7–3.1)
Lymphs: 12 %
MCH: 31.3 pg (ref 26.6–33.0)
MCHC: 33.7 g/dL (ref 31.5–35.7)
MCV: 93 fL (ref 79–97)
Monocytes Absolute: 0.8 10*3/uL (ref 0.1–0.9)
Monocytes: 7 %
Neutrophils Absolute: 8.8 10*3/uL — ABNORMAL HIGH (ref 1.4–7.0)
Neutrophils: 78 %
Platelets: 276 10*3/uL (ref 150–450)
RBC: 4.76 x10E6/uL (ref 3.77–5.28)
RDW: 13.3 % (ref 11.7–15.4)
WBC: 11.3 10*3/uL — ABNORMAL HIGH (ref 3.4–10.8)

## 2020-10-08 LAB — CMP14+EGFR
ALT: 16 IU/L (ref 0–32)
AST: 12 IU/L (ref 0–40)
Albumin/Globulin Ratio: 1.5 (ref 1.2–2.2)
Albumin: 3.8 g/dL (ref 3.7–4.7)
Alkaline Phosphatase: 157 IU/L — ABNORMAL HIGH (ref 44–121)
BUN/Creatinine Ratio: 18 (ref 12–28)
BUN: 16 mg/dL (ref 8–27)
Bilirubin Total: 1.2 mg/dL (ref 0.0–1.2)
CO2: 19 mmol/L — ABNORMAL LOW (ref 20–29)
Calcium: 8.9 mg/dL (ref 8.7–10.3)
Chloride: 103 mmol/L (ref 96–106)
Creatinine, Ser: 0.9 mg/dL (ref 0.57–1.00)
Globulin, Total: 2.5 g/dL (ref 1.5–4.5)
Glucose: 143 mg/dL — ABNORMAL HIGH (ref 65–99)
Potassium: 4.3 mmol/L (ref 3.5–5.2)
Sodium: 140 mmol/L (ref 134–144)
Total Protein: 6.3 g/dL (ref 6.0–8.5)
eGFR: 66 mL/min/{1.73_m2} (ref >59)

## 2020-10-08 LAB — POCT INR: INR: 1.7 — AB (ref 2.0–3.0)

## 2020-10-08 LAB — TSH: TSH: 5.03 u[IU]/mL — ABNORMAL HIGH (ref 0.450–4.500)

## 2020-10-08 NOTE — Patient Instructions (Signed)
Take 1 extra warfarin tablet tonight then resume 1 tablet daily  Continue greens Recheck in 3 weeks

## 2020-10-09 ENCOUNTER — Other Ambulatory Visit: Payer: Self-pay | Admitting: Family

## 2020-10-09 MED ORDER — LEVOTHYROXINE SODIUM 175 MCG PO TABS: 175.0000 ug | ORAL_TABLET | Freq: Every day | ORAL | 1 refills | Status: DC

## 2020-10-10 ENCOUNTER — Other Ambulatory Visit: Payer: Self-pay | Admitting: Family

## 2020-10-15 ENCOUNTER — Telehealth: Payer: Self-pay | Admitting: Family

## 2020-10-15 NOTE — Telephone Encounter (Signed)
Pt wants to talk to nurse about WBC

## 2020-10-16 NOTE — Telephone Encounter (Signed)
Patient aware and verbalized understanding. °

## 2020-10-17 ENCOUNTER — Ambulatory Visit: Payer: Medicare Other | Admitting: Family

## 2020-10-17 ENCOUNTER — Other Ambulatory Visit: Payer: Medicare Other

## 2020-10-20 ENCOUNTER — Ambulatory Visit (INDEPENDENT_AMBULATORY_CARE_PROVIDER_SITE_OTHER): Payer: Medicare Other

## 2020-10-20 DIAGNOSIS — Z0000 Encounter for general adult medical examination without abnormal findings: Secondary | ICD-10-CM | POA: Diagnosis not present

## 2020-10-20 NOTE — Patient Instructions (Signed)
[facility] Maintenance Summary and Written Plan of Care  Megan [name] ,  Thank you for allowing me to perform your Medicare Annual Wellness Visit and for your ongoing commitment to your health.   Health Maintenance & Immunization History Health Maintenance  Topic Date Due   OPHTHALMOLOGY EXAM  Never done   COVID-19 Vaccine (2 - Booster for Janssen series) 09/06/2019   Zoster Vaccines- Shingrix (1 of 2) 01/07/2021 (Originally 04/17/1994)   INFLUENZA VACCINE  10/27/2020   HEMOGLOBIN A1C  02/14/2021   FOOT EXAM  10/07/2021   TETANUS/TDAP  09/12/2028   DEXA SCAN  Completed   Hepatitis C Screening  Completed   PNA vac Low Risk Adult  Completed   HPV VACCINES  Aged Out   Immunization History  Administered Date(s) Administered   Fluad Quad(high Dose 65+) 12/18/2019   Influenza, High Dose Seasonal PF 01/26/2017, 01/12/2018   Influenza,inj,Quad PF,6+ Mos 01/01/2016, 12/28/2018   Janssen (J&J) SARS-COV-2 Vaccination 07/12/2019   Pneumococcal Conjugate-13 12/22/2012, 01/12/2018   Pneumococcal Polysaccharide-23 12/06/2007, 02/12/2020   Pneumococcal-Unspecified 12/22/2012   Td 09/13/2018   Tdap 09/13/2018    These are the patient goals that we discussed:  Goals Addressed               This Visit's Progress     Patient Stated     "I would like to stay out of the hospital" (pt-stated)   On track     Current Barriers:  Knowledge Deficits related to respiratory disease process  Nurse Case Manager Clinical Goal(s):  Over the next 60 days, patient will work with Consulting civil engineer to determine what respiratory disease process she has and how best to manage it Over the next 6 months, patient will not experience any respiratory complications and will not require any ED visits or hospital admissions for management  Interventions:  Chart reviewed. Patient does not have a diagnosis of asthma, COPD, or any other chronic respiratory illness. Discussed current  respiratory condition status and history Reports having "bronchitis" that usually requires ED visit or hospitalization  Always happens around the end of March or April She uses albuterol several times a week and when having a flare she will use it several times a day She is not on a maintenance inhaler She does not have a neublizer Reviewed medications with patient and discussed albuterol Discussed plans with patient for ongoing care management follow up and provided patient with direct contact information for care management team Discussed insurance coverage. She does not have a prescription drug plan and pays out-of-pocket for all meds. She does utilize [employer], GoodRx, and [facility]'s discounted drug list  Patient Self Care Activities:  Performs ADL's independently Performs IADL's independently   Initial goal documentation       Other     Exercise 150 min/wk Moderate Activity   Not on track     Have 3 meals a day   On track     Prevent falls   On track        This is a list of Health Maintenance Items that are overdue or due now: Health Maintenance Due  Topic Date Due   OPHTHALMOLOGY EXAM  Never done   COVID-19 Vaccine (2 - Booster for Janssen series) 09/06/2019     Orders/Referrals Placed Today: No orders of the defined types were placed in this encounter.  (Contact our referral department at [phone] if you have not spoken with someone about your referral appointment within the  next 5 days)    Follow-up Plan  You are do to come back for a 3 month follow up with Evelina Dun, FNP around October 12th.  Please call to schedule.

## 2020-10-20 NOTE — Progress Notes (Addendum)
MEDICARE ANNUAL WELLNESS VISIT  10/20/2020  Telephone Visit Disclaimer This Medicare AWV was conducted by telephone due to national recommendations for restrictions regarding the COVID-19 Pandemic (e.g. social distancing).  I verified, using two identifiers, that I am speaking with Megan [name] or their authorized healthcare agent. I discussed the limitations, risks, security, and privacy concerns of performing an evaluation and management service by telephone and the potential availability of an in-person appointment in the future. The patient expressed understanding and agreed to proceed.  Location of Patient: Home Location of Provider (nurse):  [facility]  Subjective:    Megan [name] is a 75 y.o. female patient of Hawks, Theador Hawthorne, FNP who had a Medicare Annual Wellness Visit today via telephone. Megan [name] is Retired and lives with their son. she has one children. she reports that she is not socially active and does not interact with friends/family regularly. she is not physically active and enjoys reading.  Patient Care Team: Sharion Balloon, FNP as PCP - General (Family Medicine) Thompson Grayer, MD as PCP - Electrophysiology (Cardiology) Ilean China, RN as Registered Nurse  Advanced Directives 10/20/2020 09/30/2020 10/19/2019 10/18/2018 06/25/2018 07/05/2017 03/14/2017  Does Patient Have a Medical Advance Directive? No No No No No No No  Would patient like information on creating a medical advance directive? No - Patient declined No - Patient declined No - Patient declined No - Patient declined No - Patient declined No - Patient declined No - Patient declined    Hospital Utilization Over the Past 12 Months: # of hospitalizations or ER visits: 0 # of surgeries: 0  Review of Systems    Patient reports that her overall health is unchanged compared to last year.  Patient Reported Readings (BP, Pulse, CBG, Weight, etc) none  Pain Assessment Pain :  No/denies pain     Current Medications & Allergies (verified) Allergies as of 10/20/2020   No Known Allergies      Medication List        Accurate as of October 20, 2020  9:16 AM. If you have any questions, ask your nurse or doctor.          albuterol 108 (90 Base) MCG/ACT inhaler Commonly known as: VENTOLIN HFA Inhale 2 puffs into the lungs every 6 (six) hours as needed for wheezing or shortness of breath.   atorvastatin 80 MG tablet Commonly known as: LIPITOR Take 1 tablet (80 mg total) by mouth daily.   blood glucose meter kit and supplies Dispense based on patient and insurance preference. Use up to four times daily as directed. (FOR ICD-10 E10.9, E11.9).   clotrimazole-betamethasone cream Commonly known as: LOTRISONE Apply 1 application topically 2 (two) times daily.   diltiazem 240 MG 24 hr capsule Commonly known as: CARDIZEM CD Take 1 capsule (240 mg total) by mouth daily.   esomeprazole 40 MG capsule Commonly known as: NEXIUM Take 1 capsule by mouth once daily   levothyroxine 175 MCG tablet Commonly known as: SYNTHROID Take 1 tablet (175 mcg total) by mouth daily before breakfast.   metoprolol tartrate 100 MG tablet Commonly known as: LOPRESSOR Take 1/2 (one-half) tablet by mouth twice daily   metroNIDAZOLE 1 % gel Commonly known as: Metrogel Apply topically daily.   nystatin cream Commonly known as: MYCOSTATIN Apply topically 3 (three) times daily. Apply to affected area 3 times daily   silver sulfADIAZINE 1 % cream Commonly known as: Silvadene Apply 1 application topically daily.  Vitamin D (Ergocalciferol) 1.25 MG (50000 UNIT) Caps capsule Commonly known as: DRISDOL Take 1 capsule (50,000 Units total) by mouth every 7 (seven) days.   warfarin 3 MG tablet Commonly known as: COUMADIN Take as directed by the anticoagulation clinic. If you are unsure how to take this medication, talk to your nurse or doctor. Original instructions: Take 1  tablet daily except 1/2 tablet on Saturdays or as directed        History (reviewed): Past Medical History:  Diagnosis Date   Acute diastolic heart failure (HCC)    Anxiety state, unspecified    Atrial fibrillation (HCC)    Congestive heart failure, unspecified    Degenerative joint disease    GERD (gastroesophageal reflux disease)    Hyperlipidemia    Hypertension    Lymphedema    Obesity    OSA (obstructive sleep apnea) 06/09/2015   Persistent atrial fibrillation (HCC)     post-termination pauses with afib s/p PPM   Presence of permanent cardiac pacemaker    Sinoatrial node dysfunction ([place])    s/p PPM   Sleep apnea    compliant with CPAP   Unspecified hypothyroidism    Unspecified venous (peripheral) insufficiency    Past Surgical History:  Procedure Laterality Date   CATARACT EXTRACTION W/PHACO Left 09/09/2014   Procedure: CATARACT EXTRACTION PHACO AND INTRAOCULAR LENS PLACEMENT ([place]);  Surgeon: Tonny Branch, MD;  Location: [facility];  Service: Ophthalmology;  Laterality: Left;  CDE: 6.99   CATARACT EXTRACTION W/PHACO Right 10/07/2014   Procedure: CATARACT EXTRACTION PHACO AND INTRAOCULAR LENS PLACEMENT RIGHT EYE;  Surgeon: Tonny Branch, MD;  Location: [facility];  Service: Ophthalmology;  Laterality: Right;  CDE:5.16   PACEMAKER INSERTION     SJM by JA   Family History  Problem Relation Age of Onset   Parkinson's disease Other    CVA Other    Parkinson's disease Father    Social History   Socioeconomic History   Marital status: Widowed    Spouse name: Not on file   Number of children: 1   Years of education: Not on file   Highest education level: High school graduate  Occupational History   Occupation: RETIRED    Employer: RETIRED  Tobacco Use   Smoking status: Former    Packs/day: 0.50    Years: 10.00    Pack years: 5.00    Types: Cigarettes    Quit date: 03/29/1978    Years since quitting: 42.5   Smokeless tobacco: Never  Vaping Use   Vaping Use: Never used   Substance and Sexual Activity   Alcohol use: No    Alcohol/week: 0.0 standard drinks   Drug use: No   Sexual activity: Not Currently    Birth control/protection: None  Other Topics Concern   Not on file  Social History Narrative   Not on file   Social Determinants of Health   Financial Resource Strain: Not on file  Food Insecurity: Not on file  Transportation Needs: Not on file  Physical Activity: Not on file  Stress: Not on file  Social Connections: Not on file    Activities of Daily Living In your present state of health, do you have any difficulty performing the following activities: 10/20/2020  Hearing? N  Vision? N  Difficulty concentrating or making decisions? Y  Walking or climbing stairs? Y  Dressing or bathing? N  Doing errands, shopping? Y  Preparing Food and eating ? N  Using the Toilet? N  In the  past six months, have you accidently leaked urine? N  Do you have problems with loss of bowel control? N  Managing your Medications? N  Managing your Finances? Y  Housekeeping or managing your Housekeeping? Y  Some recent data might be hidden    Patient Education/ Literacy How often do you need to have someone help you when you read instructions, pamphlets, or other written materials from your doctor or pharmacy?: 3 - Sometimes What is the last grade level you completed in school?: 12th  Exercise Current Exercise Habits: The patient does not participate in regular exercise at present  Diet Patient reports consuming 3 meals a day and 1 snack(s) a day Patient reports that her primary diet is: Regular Patient reports that she does have regular access to food.   Depression Screen PHQ 2/9 Scores 10/20/2020 10/07/2020 08/14/2020 06/09/2020 02/12/2020 10/19/2019 09/12/2019  PHQ - 2 Score 1 1 0 0 0 0 0  PHQ- 9 Score - 6 0 - - - -     Fall Risk Fall Risk  10/20/2020 10/07/2020 06/09/2020 02/12/2020 10/19/2019  Falls in the past year? 0 0 0 0 0  Number falls in past yr: -  - - - -  Injury with Fall? - - - - -  Risk Factor Category  - - - - -  Risk for fall due to : - - - - -  Follow up - - - - -     Objective:  Megan [name] seemed alert and oriented and she participated appropriately during our telephone visit.  Blood Pressure Weight BMI  BP Readings from Last 3 Encounters:  10/07/20 123/71  10/01/20 131/78  08/14/20 124/82   Wt Readings from Last 3 Encounters:  09/30/20 291 lb 0.1 oz (132 kg)  08/14/20 290 lb (131.5 kg)  03/14/20 290 lb (131.5 kg)   BMI Readings from Last 1 Encounters:  10/07/20 53.23 kg/m    *Unable to obtain current vital signs, weight, and BMI due to telephone visit type  Hearing/Vision  Kelin did not seem to have difficulty with hearing/understanding during the telephone conversation Reports that she has not had a formal eye exam by an eye care professional within the past year Reports that she has not had a formal hearing evaluation within the past year *Unable to fully assess hearing and vision during telephone visit type  Cognitive Function: 6CIT Screen 10/20/2020 10/19/2019 10/18/2018  What Year? 0 points 0 points 0 points  What month? 0 points 0 points 0 points  What time? 0 points 0 points 0 points  Count back from 20 0 points 0 points 0 points  Months in reverse 0 points 0 points 0 points  Repeat phrase 2 points 0 points 0 points  Total Score 2 0 0   (Normal:0-7, Significant for Dysfunction: >8)  Normal Cognitive Function Screening: Yes   Immunization & Health Maintenance Record Immunization History  Administered Date(s) Administered   Fluad Quad(high Dose 65+) 12/18/2019   Influenza, High Dose Seasonal PF 01/26/2017, 01/12/2018   Influenza,inj,Quad PF,6+ Mos 01/01/2016, 12/28/2018   Janssen (J&J) SARS-COV-2 Vaccination 07/12/2019   Pneumococcal Conjugate-13 12/22/2012, 01/12/2018   Pneumococcal Polysaccharide-23 12/06/2007, 02/12/2020   Pneumococcal-Unspecified 12/22/2012   Td 09/13/2018   Tdap  09/13/2018    Health Maintenance  Topic Date Due   OPHTHALMOLOGY EXAM  Never done   COVID-19 Vaccine (2 - Booster for Janssen series) 09/06/2019   Zoster Vaccines- Shingrix (1 of 2) 01/07/2021 (Originally 04/17/1994)   INFLUENZA  VACCINE  10/27/2020   HEMOGLOBIN A1C  02/14/2021   FOOT EXAM  10/07/2021   TETANUS/TDAP  09/12/2028   DEXA SCAN  Completed   Hepatitis C Screening  Completed   PNA vac Low Risk Adult  Completed   HPV VACCINES  Aged Out       Assessment  This is a routine wellness examination for Megan [name].  Health Maintenance: Due or Overdue Health Maintenance Due  Topic Date Due   OPHTHALMOLOGY EXAM  Never done   COVID-19 Vaccine (2 - Booster for [employer] series) 09/06/2019    Megan [name] does not need a referral for Community Assistance: Care Management:   no Social Work:    no Prescription Assistance:  no Nutrition/Diabetes Education:  no   Plan:  Personalized Goals  Goals Addressed               This Visit's Progress     Patient Stated     "I would like to stay out of the hospital" (pt-stated)   On track     Current Barriers:  Knowledge Deficits related to respiratory disease process  Nurse Case Manager Clinical Goal(s):  Over the next 60 days, patient will work with Consulting civil engineer to determine what respiratory disease process she has and how best to manage it Over the next 6 months, patient will not experience any respiratory complications and will not require any ED visits or hospital admissions for management  Interventions:  Chart reviewed. Patient does not have a diagnosis of asthma, COPD, or any other chronic respiratory illness. Discussed current respiratory condition status and history Reports having "bronchitis" that usually requires ED visit or hospitalization  Always happens around the end of March or April She uses albuterol several times a week and when having a flare she will use it several times a day She is not on a  maintenance inhaler She does not have a neublizer Reviewed medications with patient and discussed albuterol Discussed plans with patient for ongoing care management follow up and provided patient with direct contact information for care management team Discussed insurance coverage. She does not have a prescription drug plan and pays out-of-pocket for all meds. She does utilize [employer], GoodRx, and [employer]'s discounted drug list  Patient Self Care Activities:  Performs ADL's independently Performs IADL's independently   Initial goal documentation       Other     Exercise 150 min/wk Moderate Activity   Not on track     Have 3 meals a day   On track     Prevent falls   On track      Personalized Health Maintenance & Screening Recommendations  Eye exam and Covid Booster  Lung Cancer Screening Recommended: no (Low Dose CT Chest recommended if Age 48-80 years, 30 pack-year currently smoking OR have quit w/in past 15 years) Hepatitis C Screening recommended: no HIV Screening recommended: no  Advanced Directives: Written information was not prepared per patient's request.  Referrals & Orders No orders of the defined types were placed in this encounter.   Follow-up Plan Follow-up with Sharion Balloon, FNP as planned Schedule Patient did not want to schedule an appointment at this time. Needs to speak with son first.    I have personally reviewed and noted the following in the patient's chart:   Medical and social history Use of alcohol, tobacco or illicit drugs  Current medications and supplements Functional ability and status Nutritional status Physical activity Advanced directives  List of other physicians Hospitalizations, surgeries, and ER visits in previous 12 months Vitals Screenings to include cognitive, depression, and falls Referrals and appointments  In addition, I have reviewed and discussed with Megan [name] certain preventive protocols, quality  metrics, and best practice recommendations. A written personalized care plan for preventive services as well as general preventive health recommendations is available and can be mailed to the patient at her request.      Alphonzo Dublin ,LPN 08/20/8183  I have reviewed and agree with the above AWV documentation.   Evelina Dun, FNP

## 2020-10-23 ENCOUNTER — Ambulatory Visit (INDEPENDENT_AMBULATORY_CARE_PROVIDER_SITE_OTHER): Payer: Medicare Other | Admitting: Family Medicine

## 2020-10-23 ENCOUNTER — Encounter: Payer: Self-pay | Admitting: Family Medicine

## 2020-10-23 DIAGNOSIS — N3090 Cystitis, unspecified without hematuria: Secondary | ICD-10-CM

## 2020-10-23 MED ORDER — CEPHALEXIN 500 MG PO CAPS: 500.0000 mg | ORAL_CAPSULE | Freq: Three times a day (TID) | ORAL | 0 refills | Status: DC

## 2020-10-23 NOTE — Progress Notes (Signed)
Subjective:    Patient ID: SALIMATA WAIBEL, female    DOB: March 29, 1946, 75 y.o.   MRN: [id number]   HPI: MARVINE SHIPE is a 75 y.o. female presenting for burning with urination and frequency for several days. Denies fever . No flank pain. No nausea, vomiting.    Depression screen [facility] 2/9 10/20/2020 10/07/2020 08/14/2020 06/09/2020 02/12/2020  Decreased Interest 1 1 0 0 0  Down, Depressed, Hopeless 0 0 0 0 0  PHQ - 2 Score 1 1 0 0 0  Altered sleeping - 1 0 - -  Tired, decreased energy - 2 0 - -  Change in appetite - 2 0 - -  Feeling bad or failure about yourself  - 0 0 - -  Trouble concentrating - 0 0 - -  Moving slowly or fidgety/restless - 0 0 - -  Suicidal thoughts - 0 0 - -  PHQ-9 Score - 6 0 - -  Difficult doing work/chores - Very difficult Not difficult at all - -  Some recent data might be hidden     Relevant past medical, surgical, family and social history reviewed and updated as indicated.  Interim medical history since our last visit reviewed. Allergies and medications reviewed and updated.  ROS:  Review of Systems  Constitutional:  Negative for chills, diaphoresis and fever.  HENT:  Negative for congestion.   Eyes:  Negative for visual disturbance.  Respiratory:  Negative for cough and shortness of breath.   Cardiovascular:  Negative for chest pain and palpitations.  Gastrointestinal:  Negative for constipation, diarrhea and nausea.  Genitourinary:  Positive for decreased urine volume, dysuria and urgency. Negative for flank pain, frequency and pelvic pain.  Musculoskeletal:  Negative for arthralgias and joint swelling.  Skin:  Negative for rash.  Neurological:  Negative for dizziness and numbness.    Social History   Tobacco Use  Smoking Status Former   Packs/day: 0.50   Years: 10.00   Pack years: 5.00   Types: Cigarettes   Quit date: 03/29/1978   Years since quitting: 42.6  Smokeless Tobacco Never       Objective:     Wt Readings from Last 3  Encounters:  09/30/20 291 lb 0.1 oz (132 kg)  08/14/20 290 lb (131.5 kg)  03/14/20 290 lb (131.5 kg)     Exam deferred. Pt. Harboring due to COVID 19. Phone visit performed.   Assessment & Plan:   1. Cystitis     Meds ordered this encounter  Medications   cephALEXin (KEFLEX) 500 MG capsule    Sig: Take 1 capsule (500 mg total) by mouth 3 (three) times daily.    Dispense:  21 capsule    Refill:  0    No orders of the defined types were placed in this encounter.     Diagnoses and all orders for this visit:  Cystitis  Other orders -     cephALEXin (KEFLEX) 500 MG capsule; Take 1 capsule (500 mg total) by mouth 3 (three) times daily.   Virtual Visit via telephone Note  I discussed the limitations, risks, security and privacy concerns of performing an evaluation and management service by telephone and the availability of in person appointments. The patient was identified with two identifiers. Pt.expressed understanding and agreed to proceed. Pt. Is at home. Dr. Livia Snellen is in his office.  Follow Up Instructions:   I discussed the assessment and treatment plan with the patient. The patient was provided an opportunity  to ask questions and all were answered. The patient agreed with the plan and demonstrated an understanding of the instructions.   The patient was advised to call back or seek an in-person evaluation if the symptoms worsen or if the condition fails to improve as anticipated.   Total minutes including chart review and phone contact time: 6   Follow up plan: Return if symptoms worsen or fail to improve.  Claretta Fraise, MD [facility]

## 2020-10-30 ENCOUNTER — Ambulatory Visit (INDEPENDENT_AMBULATORY_CARE_PROVIDER_SITE_OTHER): Payer: Medicare Other | Admitting: *Deleted

## 2020-10-30 DIAGNOSIS — Z5181 Encounter for therapeutic drug level monitoring: Secondary | ICD-10-CM | POA: Diagnosis not present

## 2020-10-30 DIAGNOSIS — I4891 Unspecified atrial fibrillation: Secondary | ICD-10-CM

## 2020-10-30 LAB — POCT INR: INR: 3.2 — AB (ref 2.0–3.0)

## 2020-10-30 NOTE — Patient Instructions (Signed)
Hold warfarin tonight then resume 1 tablet daily  Continue greens Recheck in 3 weeks

## 2020-11-02 DIAGNOSIS — R0602 Shortness of breath: Secondary | ICD-10-CM | POA: Diagnosis not present

## 2020-11-02 DIAGNOSIS — R531 Weakness: Secondary | ICD-10-CM | POA: Diagnosis not present

## 2020-11-03 DIAGNOSIS — E1140 Type 2 diabetes mellitus with diabetic neuropathy, unspecified: Secondary | ICD-10-CM | POA: Diagnosis not present

## 2020-11-03 DIAGNOSIS — E1151 Type 2 diabetes mellitus with diabetic peripheral angiopathy without gangrene: Secondary | ICD-10-CM | POA: Diagnosis not present

## 2020-11-06 ENCOUNTER — Encounter: Payer: Self-pay | Admitting: Family Medicine

## 2020-11-06 ENCOUNTER — Ambulatory Visit (INDEPENDENT_AMBULATORY_CARE_PROVIDER_SITE_OTHER): Payer: Medicare Other | Admitting: Family Medicine

## 2020-11-06 DIAGNOSIS — J302 Other seasonal allergic rhinitis: Secondary | ICD-10-CM | POA: Diagnosis not present

## 2020-11-06 DIAGNOSIS — R399 Unspecified symptoms and signs involving the genitourinary system: Secondary | ICD-10-CM | POA: Diagnosis not present

## 2020-11-06 MED ORDER — LORATADINE 10 MG PO TABS: 10.0000 mg | ORAL_TABLET | Freq: Every day | ORAL | 2 refills | Status: DC

## 2020-11-06 MED ORDER — SULFAMETHOXAZOLE-TRIMETHOPRIM 800-160 MG PO TABS: 1.0000 | ORAL_TABLET | Freq: Two times a day (BID) | ORAL | 0 refills | Status: DC

## 2020-11-06 NOTE — Progress Notes (Signed)
   Virtual Visit  Note Due to COVID-19 pandemic this visit was conducted virtually. This visit type was conducted due to national recommendations for restrictions regarding the COVID-19 Pandemic (e.g. social distancing, sheltering in place) in an effort to limit this patient's exposure and mitigate transmission in our community. All issues noted in this document were discussed and addressed.  A physical exam was not performed with this format.  I connected with Megan [name] on 11/06/20 at 1405 by telephone and verified that I am speaking with the correct person using two identifiers. Megan [name] is currently located at home and no one is currently with her during the visit. The provider, Gwenlyn Perking, FNP is located in their office at time of visit.  I discussed the limitations, risks, security and privacy concerns of performing an evaluation and management service by telephone and the availability of in person appointments. I also discussed with the patient that there may be a patient responsible charge related to this service. The patient expressed understanding and agreed to proceed.  CC: congestion  History and Present Illness:  HPI Megan [name] reports nasal congestion, cough, and sneezing that has been intermittent for the last year. She denies fever, sore throat, ear pain, HA, or chills. She has used vicks rub with some good relief. She has tried Nasacort without improvement but then noticed that it was expired. She would like to know what she can take for this.   She was treated for a UTI on 10/23/20 with keflex. She stopped taking the keflex after a few days because she developed a rash on her face. She reports increased frequency and urgency for the last few days with dysuria. Denies fever, back pain, or chills. She is not able to come by the office to leave a specimen.    ROS As per HPI.   Observations/Objective: Alert and oriented x 3. Able to speak in full sentences without  difficulty.   Assessment and Plan: Megan [name] was seen today for nasal congestion.  Diagnoses and all orders for this visit:  UTI symptoms Will treat with bactrim. Return to office for new or worsening symptoms, or if symptoms persist.  -     sulfamethoxazole-trimethoprim (BACTRIM DS) 800-160 MG tablet; Take 1 tablet by mouth 2 (two) times daily.  Seasonal allergies Try claritin as below.  -     loratadine (CLARITIN) 10 MG tablet; Take 1 tablet (10 mg total) by mouth daily.   Follow Up Instructions: Return to office for new or worsening symptoms, or if symptoms persist.     I discussed the assessment and treatment plan with the patient. The patient was provided an opportunity to ask questions and all were answered. The patient agreed with the plan and demonstrated an understanding of the instructions.   The patient was advised to call back or seek an in-person evaluation if the symptoms worsen or if the condition fails to improve as anticipated.  The above assessment and management plan was discussed with the patient. The patient verbalized understanding of and has agreed to the management plan. Patient is aware to call the clinic if symptoms persist or worsen. Patient is aware when to return to the clinic for a follow-up visit. Patient educated on when it is appropriate to go to the emergency department.   Time call ended:  1417  I provided 12 minutes of  non face-to-face time during this encounter.    Gwenlyn Perking, FNP

## 2020-11-15 ENCOUNTER — Other Ambulatory Visit: Payer: Self-pay | Admitting: Family

## 2020-11-15 DIAGNOSIS — E782 Mixed hyperlipidemia: Secondary | ICD-10-CM

## 2020-11-24 ENCOUNTER — Other Ambulatory Visit: Payer: Self-pay

## 2020-11-24 ENCOUNTER — Ambulatory Visit (INDEPENDENT_AMBULATORY_CARE_PROVIDER_SITE_OTHER): Payer: Medicare Other | Admitting: *Deleted

## 2020-11-24 DIAGNOSIS — I4891 Unspecified atrial fibrillation: Secondary | ICD-10-CM | POA: Diagnosis not present

## 2020-11-24 DIAGNOSIS — Z5181 Encounter for therapeutic drug level monitoring: Secondary | ICD-10-CM | POA: Diagnosis not present

## 2020-11-24 LAB — POCT INR: INR: 2.7 (ref 2.0–3.0)

## 2020-11-24 NOTE — Patient Instructions (Signed)
Take warfarin 1/2 tablet tonight then resume 1 tablet daily  Continue greens Recheck in 3 weeks

## 2020-12-04 ENCOUNTER — Institutional Professional Consult (permissible substitution): Payer: Medicare Other | Admitting: Neurology

## 2020-12-15 ENCOUNTER — Ambulatory Visit (INDEPENDENT_AMBULATORY_CARE_PROVIDER_SITE_OTHER): Payer: Medicare Other | Admitting: *Deleted

## 2020-12-15 ENCOUNTER — Other Ambulatory Visit: Payer: Self-pay

## 2020-12-15 DIAGNOSIS — I4891 Unspecified atrial fibrillation: Secondary | ICD-10-CM | POA: Diagnosis not present

## 2020-12-15 DIAGNOSIS — Z5181 Encounter for therapeutic drug level monitoring: Secondary | ICD-10-CM | POA: Diagnosis not present

## 2020-12-15 LAB — POCT INR: INR: 2.8 (ref 2.0–3.0)

## 2020-12-15 NOTE — Patient Instructions (Signed)
Decrease warfarin to 1 tablet daily except 1/2 tablet on Mondays Continue greens Recheck in 3 weeks

## 2020-12-17 ENCOUNTER — Other Ambulatory Visit: Payer: Self-pay | Admitting: Family

## 2020-12-17 DIAGNOSIS — I4891 Unspecified atrial fibrillation: Secondary | ICD-10-CM

## 2020-12-18 ENCOUNTER — Encounter: Payer: Self-pay | Admitting: Family

## 2020-12-18 ENCOUNTER — Ambulatory Visit (INDEPENDENT_AMBULATORY_CARE_PROVIDER_SITE_OTHER): Payer: Medicare Other | Admitting: Family

## 2020-12-18 DIAGNOSIS — E1169 Type 2 diabetes mellitus with other specified complication: Secondary | ICD-10-CM | POA: Diagnosis not present

## 2020-12-18 DIAGNOSIS — R399 Unspecified symptoms and signs involving the genitourinary system: Secondary | ICD-10-CM | POA: Diagnosis not present

## 2020-12-18 DIAGNOSIS — E039 Hypothyroidism, unspecified: Secondary | ICD-10-CM

## 2020-12-18 DIAGNOSIS — K219 Gastro-esophageal reflux disease without esophagitis: Secondary | ICD-10-CM

## 2020-12-18 DIAGNOSIS — Z7901 Long term (current) use of anticoagulants: Secondary | ICD-10-CM | POA: Diagnosis not present

## 2020-12-18 DIAGNOSIS — E782 Mixed hyperlipidemia: Secondary | ICD-10-CM | POA: Diagnosis not present

## 2020-12-18 DIAGNOSIS — I4891 Unspecified atrial fibrillation: Secondary | ICD-10-CM

## 2020-12-18 DIAGNOSIS — I10 Essential (primary) hypertension: Secondary | ICD-10-CM

## 2020-12-18 MED ORDER — CEPHALEXIN 500 MG PO CAPS: 500.0000 mg | ORAL_CAPSULE | Freq: Two times a day (BID) | ORAL | 0 refills | Status: DC

## 2020-12-18 MED ORDER — MOMETASONE FUROATE 50 MCG/ACT NA SUSP: 2.0000 | Freq: Every day | NASAL | 12 refills | Status: DC

## 2020-12-18 MED ORDER — ESOMEPRAZOLE MAGNESIUM 40 MG PO CPDR: 40.0000 mg | DELAYED_RELEASE_CAPSULE | Freq: Every day | ORAL | 1 refills | Status: DC

## 2020-12-18 NOTE — Progress Notes (Signed)
 Virtual Visit  Note Due to COVID-19 pandemic this visit was conducted virtually. This visit type was conducted due to national recommendations for restrictions regarding the COVID-19 Pandemic (e.g. social distancing, sheltering in place) in an effort to limit this patient's exposure and mitigate transmission in our community. All issues noted in this document were discussed and addressed.  A physical exam was not performed with this format.  I connected with Megan [name] on 12/18/20 at 1:09 AM  by telephone and verified that I am speaking with the correct person using two identifiers. Megan [name] is currently located at home and family is currently with her during visit. The provider,  , FNP is located in their office at time of visit.  I discussed the limitations, risks, security and privacy concerns of performing an evaluation and management service by telephone and the availability of in person appointments. I also discussed with the patient that there may be a patient responsible charge related to this service. The patient expressed understanding and agreed to proceed.   History and Present Illness:  Pt presents to the office today for chronic follow up.  Pt is followed by Cardiologists annually for pacemaker, CHF, A Fib. She also goes to the coumadin clinic once a month.    Pt states she has generalized weakness and can not walk any distance.  Hypertension This is a chronic problem. The current episode started more than 1 year ago. The problem has been resolved since onset. The problem is controlled. Associated symptoms include malaise/fatigue, peripheral edema and shortness of breath. Pertinent negatives include no blurred vision. Risk factors for coronary artery disease include dyslipidemia, obesity and sedentary lifestyle. The current treatment provides moderate improvement. Identifiable causes of hypertension include a thyroid problem.  Diabetes She presents for her  follow-up diabetic visit. She has type 2 diabetes mellitus. Associated symptoms include fatigue. Pertinent negatives for diabetes include no blurred vision and no foot paresthesias. Risk factors for coronary artery disease include dyslipidemia, diabetes mellitus, hypertension, sedentary lifestyle and post-menopausal. She is following a generally unhealthy diet. Her overall blood glucose range is 110-130 mg/dl. An ACE inhibitor/angiotensin II receptor blocker is not being taken.  Thyroid Problem Presents for follow-up visit. Symptoms include dry skin and fatigue. Patient reports no constipation, diarrhea or hoarse voice. The symptoms have been stable. Her past medical history is significant for hyperlipidemia.  Gastroesophageal Reflux She complains of belching and heartburn. She reports no hoarse voice. This is a chronic problem. The current episode started more than 1 year ago. The problem occurs occasionally. Associated symptoms include fatigue. She has tried a PPI for the symptoms. The treatment provided moderate relief.  Hyperlipidemia This is a chronic problem. The current episode started more than 1 year ago. Exacerbating diseases include obesity. Associated symptoms include shortness of breath. Current antihyperlipidemic treatment includes statins. The current treatment provides moderate improvement of lipids.  Dysuria  This is a new problem. The current episode started in the past 7 days. The problem occurs intermittently. The problem has been waxing and waning. The quality of the pain is described as burning.     Review of Systems  Constitutional:  Positive for fatigue and malaise/fatigue.  HENT:  Negative for hoarse voice.   Eyes:  Negative for blurred vision.  Respiratory:  Positive for shortness of breath.   Gastrointestinal:  Positive for heartburn. Negative for constipation and diarrhea.  Genitourinary:  Positive for dysuria.  All other systems reviewed and are  negative.     Observations/Objective: No SOB or distress noted   Assessment and Plan: 1. HYPERTENSION, BENIGN - CMP14+EGFR; Future  2. Atrial fibrillation, unspecified type ([place]) - CMP14+EGFR; Future  3. Gastroesophageal reflux disease without esophagitis - esomeprazole (NEXIUM) 40 MG capsule; Take 1 capsule (40 mg total) by mouth daily.  Dispense: 90 capsule; Refill: 1 - CMP14+EGFR; Future  4. Acquired hypothyroidism - TSH; Future - CMP14+EGFR; Future  5. Type 2 diabetes mellitus with other specified complication, without long-term current use of insulin (HCC) - CMP14+EGFR; Future  6. Morbid obesity due to excess calories ([place]) - CMP14+EGFR; Future  7. Long term (current) use of anticoagulants - CMP14+EGFR; Future  8. Mixed hyperlipidemia - CMP14+EGFR; Future  9. UTI symptoms Force fluids AZO over the counter X2 days RTO if symptoms worsen or do not improve  - cephALEXin (KEFLEX) 500 MG capsule; Take 1 capsule (500 mg total) by mouth 2 (two) times daily.  Dispense: 14 capsule; Refill: 0 - CMP14+EGFR; Future    I discussed the assessment and treatment plan with the patient. The patient was provided an opportunity to ask questions and all were answered. The patient agreed with the plan and demonstrated an understanding of the instructions.   The patient was advised to call back or seek an in-person evaluation if the symptoms worsen or if the condition fails to improve as anticipated.  The above assessment and management plan was discussed with the patient. The patient verbalized understanding of and has agreed to the management plan. Patient is aware to call the clinic if symptoms persist or worsen. Patient is aware when to return to the clinic for a follow-up visit. Patient educated on when it is appropriate to go to the emergency department.   Time call ended:  11:30 AM   I provided 21 minutes of  non face-to-face time during this encounter.    Evelina Dun,  FNP

## 2020-12-31 ENCOUNTER — Other Ambulatory Visit: Payer: Self-pay | Admitting: Family

## 2021-01-01 ENCOUNTER — Other Ambulatory Visit: Payer: Self-pay | Admitting: Family

## 2021-01-06 ENCOUNTER — Telehealth: Payer: Self-pay | Admitting: Family

## 2021-01-06 ENCOUNTER — Ambulatory Visit (INDEPENDENT_AMBULATORY_CARE_PROVIDER_SITE_OTHER): Payer: Medicare Other | Admitting: *Deleted

## 2021-01-06 DIAGNOSIS — I4891 Unspecified atrial fibrillation: Secondary | ICD-10-CM | POA: Diagnosis not present

## 2021-01-06 DIAGNOSIS — Z5181 Encounter for therapeutic drug level monitoring: Secondary | ICD-10-CM

## 2021-01-06 LAB — POCT INR: INR: 2.2 (ref 2.0–3.0)

## 2021-01-06 NOTE — Patient Instructions (Signed)
Continue warfarin 1 tablet daily except 1/2 tablet on Mondays Continue greens Recheck in 4 weeks

## 2021-01-06 NOTE — Telephone Encounter (Signed)
Pt wants to know if she is due for lab work. Already made her an appt but she does not want to keep if she can't get labs on that day and she wanted to make sure that Alyse Low had the labs ordered. Please call back when labs are ordered.

## 2021-01-06 NOTE — Telephone Encounter (Signed)
Attempted to contact patient. Busy x 2

## 2021-01-20 ENCOUNTER — Emergency Department (HOSPITAL_COMMUNITY)
Admission: EM | Admit: 2021-01-20 | Discharge: 2021-01-20 | Disposition: A | Discharge status: Home / Self Care | Payer: Medicare Other | Attending: Emergency Medicine | Admitting: Emergency Medicine

## 2021-01-20 ENCOUNTER — Encounter: Payer: Self-pay | Admitting: Family

## 2021-01-20 ENCOUNTER — Ambulatory Visit (INDEPENDENT_AMBULATORY_CARE_PROVIDER_SITE_OTHER): Payer: Medicare Other | Admitting: Family

## 2021-01-20 ENCOUNTER — Emergency Department (HOSPITAL_COMMUNITY): Payer: Medicare Other

## 2021-01-20 ENCOUNTER — Encounter (HOSPITAL_COMMUNITY): Payer: Self-pay | Admitting: Emergency Medicine

## 2021-01-20 DIAGNOSIS — N3281 Overactive bladder: Secondary | ICD-10-CM

## 2021-01-20 DIAGNOSIS — Z87891 Personal history of nicotine dependence: Secondary | ICD-10-CM | POA: Diagnosis not present

## 2021-01-20 DIAGNOSIS — E039 Hypothyroidism, unspecified: Secondary | ICD-10-CM | POA: Insufficient documentation

## 2021-01-20 DIAGNOSIS — I110 Hypertensive heart disease with heart failure: Secondary | ICD-10-CM | POA: Insufficient documentation

## 2021-01-20 DIAGNOSIS — E782 Mixed hyperlipidemia: Secondary | ICD-10-CM

## 2021-01-20 DIAGNOSIS — R062 Wheezing: Secondary | ICD-10-CM | POA: Diagnosis not present

## 2021-01-20 DIAGNOSIS — Z79899 Other long term (current) drug therapy: Secondary | ICD-10-CM | POA: Insufficient documentation

## 2021-01-20 DIAGNOSIS — K219 Gastro-esophageal reflux disease without esophagitis: Secondary | ICD-10-CM | POA: Diagnosis not present

## 2021-01-20 DIAGNOSIS — J40 Bronchitis, not specified as acute or chronic: Secondary | ICD-10-CM | POA: Insufficient documentation

## 2021-01-20 DIAGNOSIS — E1169 Type 2 diabetes mellitus with other specified complication: Secondary | ICD-10-CM

## 2021-01-20 DIAGNOSIS — R059 Cough, unspecified: Secondary | ICD-10-CM | POA: Diagnosis not present

## 2021-01-20 DIAGNOSIS — Z09 Encounter for follow-up examination after completed treatment for conditions other than malignant neoplasm: Secondary | ICD-10-CM | POA: Diagnosis not present

## 2021-01-20 DIAGNOSIS — Z950 Presence of cardiac pacemaker: Secondary | ICD-10-CM | POA: Insufficient documentation

## 2021-01-20 DIAGNOSIS — Z20822 Contact with and (suspected) exposure to covid-19: Secondary | ICD-10-CM | POA: Diagnosis not present

## 2021-01-20 DIAGNOSIS — J209 Acute bronchitis, unspecified: Secondary | ICD-10-CM | POA: Diagnosis not present

## 2021-01-20 DIAGNOSIS — Z7901 Long term (current) use of anticoagulants: Secondary | ICD-10-CM | POA: Diagnosis not present

## 2021-01-20 DIAGNOSIS — E119 Type 2 diabetes mellitus without complications: Secondary | ICD-10-CM | POA: Insufficient documentation

## 2021-01-20 DIAGNOSIS — I5031 Acute diastolic (congestive) heart failure: Secondary | ICD-10-CM | POA: Diagnosis not present

## 2021-01-20 DIAGNOSIS — I10 Essential (primary) hypertension: Secondary | ICD-10-CM

## 2021-01-20 LAB — COMPREHENSIVE METABOLIC PANEL
ALT: 23 U/L (ref 0–44)
AST: 17 U/L (ref 15–41)
Albumin: 3.7 g/dL (ref 3.5–5.0)
Alkaline Phosphatase: 142 U/L — ABNORMAL HIGH (ref 38–126)
Anion gap: 9 (ref 5–15)
BUN: 14 mg/dL (ref 8–23)
CO2: 25 mmol/L (ref 22–32)
Calcium: 8.8 mg/dL — ABNORMAL LOW (ref 8.9–10.3)
Chloride: 101 mmol/L (ref 98–111)
Creatinine, Ser: 0.8 mg/dL (ref 0.44–1.00)
GFR, Estimated: >60 mL/min (ref >60)
Glucose, Bld: 142 mg/dL — ABNORMAL HIGH (ref 70–99)
Potassium: 4.6 mmol/L (ref 3.5–5.1)
Sodium: 135 mmol/L (ref 135–145)
Total Bilirubin: 1.9 mg/dL — ABNORMAL HIGH (ref 0.3–1.2)
Total Protein: 7.5 g/dL (ref 6.5–8.1)

## 2021-01-20 LAB — CBC WITH DIFFERENTIAL/PLATELET
Abs Immature Granulocytes: 0.09 10*3/uL — ABNORMAL HIGH (ref 0.00–0.07)
Basophils Absolute: 0.1 10*3/uL (ref 0.0–0.1)
Basophils Relative: 0 %
Eosinophils Absolute: 0.1 10*3/uL (ref 0.0–0.5)
Eosinophils Relative: 1 %
HCT: 47.6 % — ABNORMAL HIGH (ref 36.0–46.0)
Hemoglobin: 15.2 g/dL — ABNORMAL HIGH (ref 12.0–15.0)
Immature Granulocytes: 1 %
Lymphocytes Relative: 5 %
Lymphs Abs: 0.7 10*3/uL (ref 0.7–4.0)
MCH: 31.5 pg (ref 26.0–34.0)
MCHC: 31.9 g/dL (ref 30.0–36.0)
MCV: 98.6 fL (ref 80.0–100.0)
Monocytes Absolute: 0.9 10*3/uL (ref 0.1–1.0)
Monocytes Relative: 6 %
Neutro Abs: 13 10*3/uL — ABNORMAL HIGH (ref 1.7–7.7)
Neutrophils Relative %: 87 %
Platelets: 275 10*3/uL (ref 150–400)
RBC: 4.83 MIL/uL (ref 3.87–5.11)
RDW: 14.2 % (ref 11.5–15.5)
WBC: 14.9 10*3/uL — ABNORMAL HIGH (ref 4.0–10.5)
nRBC: 0 % (ref 0.0–0.2)

## 2021-01-20 LAB — RESP PANEL BY RT-PCR (FLU A&B, COVID) ARPGX2
Influenza A by PCR: NEGATIVE
Influenza B by PCR: NEGATIVE
SARS Coronavirus 2 by RT PCR: NEGATIVE

## 2021-01-20 LAB — PROTIME-INR
INR: 2.5 — ABNORMAL HIGH (ref 0.8–1.2)
Prothrombin Time: 26.7 seconds — ABNORMAL HIGH (ref 11.4–15.2)

## 2021-01-20 LAB — BRAIN NATRIURETIC PEPTIDE: B Natriuretic Peptide: 246 pg/mL — ABNORMAL HIGH (ref 0.0–100.0)

## 2021-01-20 MED ORDER — ALBUTEROL SULFATE (2.5 MG/3ML) 0.083% IN NEBU
5.0000 mg | INHALATION_SOLUTION | Freq: Once | RESPIRATORY_TRACT | Status: AC
  Administered 2021-01-20: 5 mg via RESPIRATORY_TRACT
  Filled 2021-01-20: qty 6

## 2021-01-20 MED ORDER — PREDNISONE 10 MG PO TABS: 20.0000 mg | ORAL_TABLET | Freq: Every day | ORAL | 0 refills | Status: DC

## 2021-01-20 MED ORDER — PREDNISONE 50 MG PO TABS
60.0000 mg | ORAL_TABLET | Freq: Once | ORAL | Status: AC
  Administered 2021-01-20: 60 mg via ORAL
  Filled 2021-01-20: qty 1

## 2021-01-20 MED ORDER — IPRATROPIUM-ALBUTEROL 0.5-2.5 (3) MG/3ML IN SOLN
3.0000 mL | Freq: Once | RESPIRATORY_TRACT | Status: AC
  Administered 2021-01-20: 3 mL via RESPIRATORY_TRACT
  Filled 2021-01-20: qty 3

## 2021-01-20 NOTE — Discharge Instructions (Addendum)
You have been seen and discharged from the emergency department.  You are diagnosed with bronchitis.  Continue to use your albuterol inhaler.  Take steroids as directed.  Start your steroid prescription tomorrow 01/21/2021.  Follow-up with your primary provider for reevaluation and further care. Take home medications as prescribed. If you have any worsening symptoms or further concerns for your health please return to an emergency department for further evaluation.

## 2021-01-20 NOTE — ED Provider Notes (Addendum)
Emergency Medicine Provider Triage Evaluation Note  Megan [name] , a 75 y.o. female  was evaluated in triage.  Pt with history of permanent A. fib on Coumadin, hypertension, obesity, obstructive sleep apnea.  She presents today with complaints of chest congestion, cough, and possible bronchitis.  This has been worsening over the past several days.  She denies any chest pain or increased leg swelling.  She denies fevers or chills.  Review of Systems  All other systems reviewed and are negative.  Physical Exam  BP 135/74 (BP Location: Right Arm)   Pulse 71   Temp 97.6 F (36.4 C) (Oral)   Resp (!) 24   Ht 5' (1.524 m)   Wt 136.1 kg   SpO2 95%   BMI 58.59 kg/m  Gen:   Awake, no distress   Heart:  RRR without murmurs Resp:  Normal effort with slight expiratory rhonchi bilaterally. MSK:   Moves extremities without difficulty  Other:  Extremities with non-pitting edema bilaterally  Medical Decision Making  Medically screening exam initiated at 6:37 AM.  Appropriate orders placed.  HAJER DWYER was informed that the remainder of the evaluation will be completed by another provider, this initial triage assessment does not replace that evaluation, and the importance of remaining in the ED until their evaluation is complete.     Veryl Speak, MD 01/20/21 3716    Veryl Speak, MD 01/20/21 [phone]

## 2021-01-20 NOTE — ED Triage Notes (Signed)
Pt c/o cough since last Saturday, pt states the congestion has increased since then. Pt states she has a hx of breathing problems when she has bronchitis.

## 2021-01-20 NOTE — ED Notes (Signed)
Me and RN pulled pt up in bed and repositioned her

## 2021-01-20 NOTE — ED Provider Notes (Signed)
[facility] EMERGENCY DEPARTMENT Provider Note   CSN: [id number] Arrival date & time: 01/20/21  0557     History Chief Complaint  Patient presents with   Cough    Megan [name] is a 75 y.o. female.  HPI  75 year old female past medical history of atrial fibrillation anticoagulant Coumadin, HTN, DM, HLD, CHF presents emergency department with productive cough, congestion and fatigue.  Patient states has been going on for the past couple days.  She has no acute chest pain or worsening leg swelling.  States that she is been compliant with her medication specifically Coumadin.  Denies any fever at home.  No recent flu/COVID sick contacts.  Past Medical History:  Diagnosis Date   Acute diastolic heart failure ([place])    Anxiety state, unspecified    Atrial fibrillation (HCC)    Congestive heart failure, unspecified    Degenerative joint disease    GERD (gastroesophageal reflux disease)    Hyperlipidemia    Hypertension    Lymphedema    Obesity    OSA (obstructive sleep apnea) 06/09/2015   Persistent atrial fibrillation ([place])     post-termination pauses with afib s/p PPM   Presence of permanent cardiac pacemaker    Sinoatrial node dysfunction ([place])    s/p PPM   Sleep apnea    compliant with CPAP   Unspecified hypothyroidism    Unspecified venous (peripheral) insufficiency     Patient Active Problem List   Diagnosis Date Noted   Diabetes mellitus ([place]) 10/07/2020   Chronic congestive heart failure ([place]) 08/24/2019   Intertrigo 05/11/2017   OAB (overactive bladder) 02/22/2017   Lymphedema 01/26/2017   Gastroesophageal reflux disease without esophagitis 01/26/2017   Acquired hypothyroidism 01/26/2017   Mixed hyperlipidemia 01/26/2017   Pyrexia    Sleeps in sitting position due to orthopnea 07/31/2015   Morbid obesity due to excess calories ([place]) 06/09/2015   PVD (peripheral vascular disease) with claudication ([place]) 06/09/2015   Long term (current) use of  anticoagulants 06/19/2010   Tachycardia-bradycardia syndrome ([place]) 11/18/2008   PPM-[place]ude 11/18/2008   HYPERTENSION, BENIGN 02/21/2008   ATRIAL FIBRILLATION 02/21/2008    Past Surgical History:  Procedure Laterality Date   CATARACT EXTRACTION W/PHACO Left 09/09/2014   Procedure: CATARACT EXTRACTION PHACO AND INTRAOCULAR LENS PLACEMENT ([place]);  Surgeon: Tonny Branch, MD;  Location: [facility];  Service: Ophthalmology;  Laterality: Left;  CDE: 6.99   CATARACT EXTRACTION W/PHACO Right 10/07/2014   Procedure: CATARACT EXTRACTION PHACO AND INTRAOCULAR LENS PLACEMENT RIGHT EYE;  Surgeon: Tonny Branch, MD;  Location: [facility];  Service: Ophthalmology;  Laterality: Right;  CDE:5.16   PACEMAKER INSERTION     SJM by JA     OB History   No obstetric history on file.     Family History  Problem Relation Age of Onset   Parkinson's disease Other    CVA Other    Parkinson's disease Father     Social History   Tobacco Use   Smoking status: Former    Packs/day: 0.50    Years: 10.00    Pack years: 5.00    Types: Cigarettes    Quit date: 03/29/1978    Years since quitting: 42.8   Smokeless tobacco: Never  Vaping Use   Vaping Use: Never used  Substance Use Topics   Alcohol use: No    Alcohol/week: 0.0 standard drinks   Drug use: No    Home Medications Prior to Admission medications   Medication Sig Start Date End Date Taking?  Authorizing Provider  albuterol (VENTOLIN HFA) 108 (90 Base) MCG/ACT inhaler INHALE 2 PUFFS BY MOUTH EVERY 6 HOURS AS NEEDED FOR WHEEZING OR SHORTNESS OF BREATH 01/01/21   Evelina Dun A, FNP  atorvastatin (LIPITOR) 80 MG tablet Take 1 tablet by mouth once daily 11/17/20   Sharion Balloon, FNP  blood glucose meter kit and supplies Dispense based on patient and insurance preference. Use up to four times daily as directed. (FOR ICD-10 E10.9, E11.9). 06/09/20   Sharion Balloon, FNP  cephALEXin (KEFLEX) 500 MG capsule Take 1 capsule (500 mg total) by mouth 2 (two) times daily.  12/18/20   Sharion Balloon, FNP  clotrimazole-betamethasone (LOTRISONE) cream Apply 1 application topically 2 (two) times daily. 08/15/20   Sharion Balloon, FNP  diltiazem (CARDIZEM CD) 240 MG 24 hr capsule Take 1 capsule (240 mg total) by mouth daily. (NEEDS TO BE SEEN BEFORE NEXT REFILL) 12/17/20   Sharion Balloon, FNP  esomeprazole (NEXIUM) 40 MG capsule Take 1 capsule (40 mg total) by mouth daily. 12/18/20   Sharion Balloon, FNP  levothyroxine (SYNTHROID) 175 MCG tablet Take 1 tablet (175 mcg total) by mouth daily before breakfast. 10/09/20   Sharion Balloon, FNP  metoprolol tartrate (LOPRESSOR) 100 MG tablet Take 1/2 (one-half) tablet by mouth twice daily 02/12/20   Evelina Dun A, FNP  metroNIDAZOLE (METROGEL) 1 % gel Apply topically daily. 12/11/18   Hassell Done, Mary-Margaret, FNP  mometasone (NASONEX) 50 MCG/ACT nasal spray Place 2 sprays into the nose daily. 12/18/20   Evelina Dun A, FNP  nystatin cream (MYCOSTATIN) Apply topically 3 (three) times daily. Apply to affected area 3 times daily 10/01/20   Orpah Greek, MD  Vitamin D, Ergocalciferol, (DRISDOL) 1.25 MG (50000 UNIT) CAPS capsule Take 1 capsule by mouth once a week 01/01/21   Evelina Dun A, FNP  warfarin (COUMADIN) 3 MG tablet Take 1 tablet daily except 1/2 tablet on Saturdays or as directed 06/23/20   Thompson Grayer, MD    Allergies    Patient has no known allergies.  Review of Systems   Review of Systems  Constitutional:  Positive for appetite change and fatigue. Negative for chills and fever.  HENT:  Positive for congestion.   Eyes:  Negative for visual disturbance.  Respiratory:  Positive for cough, shortness of breath and wheezing.   Cardiovascular:  Positive for leg swelling. Negative for chest pain and palpitations.  Gastrointestinal:  Negative for abdominal pain, diarrhea and vomiting.  Genitourinary:  Negative for dysuria.  Skin:  Negative for rash.  Neurological:  Negative for headaches.   Physical  Exam Updated Vital Signs BP 135/74 (BP Location: Right Arm)   Pulse 71   Temp 97.6 F (36.4 C) (Oral)   Resp (!) 24   Ht 5' (1.524 m)   Wt 136.1 kg   SpO2 98%   BMI 58.59 kg/m   Physical Exam Vitals and nursing note reviewed.  Constitutional:      General: She is not in acute distress.    Appearance: Normal appearance. She is not diaphoretic.  HENT:     Head: Normocephalic.     Mouth/Throat:     Mouth: Mucous membranes are moist.  Cardiovascular:     Rate and Rhythm: Normal rate.  Pulmonary:     Effort: Pulmonary effort is normal. No respiratory distress.     Breath sounds: Wheezing present.  Abdominal:     Palpations: Abdomen is soft.     Tenderness: There is  no abdominal tenderness.  Musculoskeletal:        General: Swelling present.  Skin:    General: Skin is warm.  Neurological:     Mental Status: She is alert and oriented to person, place, and time. Mental status is at baseline.  Psychiatric:        Mood and Affect: Mood normal.    ED Results / Procedures / Treatments   Labs (all labs ordered are listed, but only abnormal results are displayed) Labs Reviewed  COMPREHENSIVE METABOLIC PANEL - Abnormal; Notable for the following components:      Result Value   Glucose, Bld 142 (*)    Calcium 8.8 (*)    Alkaline Phosphatase 142 (*)    Total Bilirubin 1.9 (*)    All other components within normal limits  BRAIN NATRIURETIC PEPTIDE - Abnormal; Notable for the following components:   B Natriuretic Peptide 246.0 (*)    All other components within normal limits  CBC WITH DIFFERENTIAL/PLATELET - Abnormal; Notable for the following components:   WBC 14.9 (*)    Hemoglobin 15.2 (*)    HCT 47.6 (*)    Neutro Abs 13.0 (*)    Abs Immature Granulocytes 0.09 (*)    All other components within normal limits  PROTIME-INR - Abnormal; Notable for the following components:   Prothrombin Time 26.7 (*)    INR 2.5 (*)    All other components within normal limits  RESP  PANEL BY RT-PCR (FLU A&B, COVID) [id number]    EKG EKG Interpretation  Date/Time:  Tuesday January 20 2021 06:13:11 EDT Ventricular Rate:  90 PR Interval:    QRS Duration: 75 QT Interval:  487 QTC Calculation: 573 R Axis:   14 Text Interpretation: Atrial fibrillation Ventricular premature complex Low voltage, extremity and precordial leads Borderline repolarization abnormality Prolonged QT interval Baseline wander in lead(s) V4 Confirmed by Veryl Speak [phone]) on 01/20/2021 6:25:20 AM  Radiology DG Chest Portable 1 View  Result Date: 01/20/2021 CLINICAL DATA:  75 year old female with history of cough and wheezing. EXAM: PORTABLE CHEST 1 VIEW COMPARISON:  Chest x-ray 10/01/2020. FINDINGS: Left-sided pacemaker device in place with lead tips projecting over the expected location of the right atrium and right ventricle. Lung volumes are normal. No consolidative airspace disease. No pleural effusions. No pneumothorax. No pulmonary nodule or mass noted. Pulmonary vasculature and the cardiomediastinal silhouette are within normal limits. IMPRESSION: No radiographic evidence of acute cardiopulmonary disease. Electronically Signed   By: Vinnie Langton M.D.   On: 01/20/2021 06:41    Procedures Procedures   Medications Ordered in ED Medications  predniSONE (DELTASONE) tablet 60 mg (has no administration in time range)  ipratropium-albuterol (DUONEB) 0.5-2.5 (3) MG/3ML nebulizer solution 3 mL (3 mLs Nebulization Given 01/20/21 6045)    ED Course  I have reviewed the triage vital signs and the nursing notes.  Pertinent labs & imaging results that were available during my care of the patient were reviewed by me and considered in my medical decision making (see chart for details).    MDM Rules/Calculators/A&P                           75 year old female presents emergency department nasal congestion, productive cough and fatigue.  Vitals are stable on arrival, afebrile.  She is in no acute  respiratory distress, scattered wheezes on lung auscultation.  Chest x-ray shows no focal pneumonia.  Flu and COVID are negative.  Blood work  is around baseline for her.  No acute findings of CHF.  Low suspicion for ACS given the infectious presentation.  After nebulizer treatment and steroids patient feels significantly improved.  Plan to treat outpatient for bronchitis.  Patient at this time appears safe and stable for discharge and will be treated as an outpatient.  Discharge plan and strict return to ED precautions discussed, patient verbalizes understanding and agreement.  Final Clinical Impression(s) / ED Diagnoses Final diagnoses:  None    Rx / DC Orders ED Discharge Orders     None        Lorelle Gibbs, DO 01/20/21 1034

## 2021-01-20 NOTE — Progress Notes (Signed)
Virtual Visit  Note Due to COVID-19 pandemic this visit was conducted virtually. This visit type was conducted due to national recommendations for restrictions regarding the COVID-19 Pandemic (e.g. social distancing, sheltering in place) in an effort to limit this patient's exposure and mitigate transmission in our community. All issues noted in this document were discussed and addressed.  A physical exam was not performed with this format.  I connected with Megan [name] on 01/20/21 at 2:08 pm  by telephone and verified that I am speaking with the correct person using two identifiers. Megan [name] is currently located at home and no one is currently with her during visit. The provider, Evelina Dun, FNP is located in their office at time of visit.  I discussed the limitations, risks, security and privacy concerns of performing an evaluation and management service by telephone and the availability of in person appointments. I also discussed with the patient that there may be a patient responsible charge related to this service. The patient expressed understanding and agreed to proceed.   History and Present Illness:  Pt calls the toffice today for chronic follow up.  Pt is followed by Cardiologists annually for pacemaker, CHF, A Fib. She also goes to the coumadin clinic once a month.   She went to the ED for cough and was diagnosed with acute bronchitis.   She can not do a video visit as she does not have a smart phone.  Diabetes She presents for her follow-up diabetic visit. She has type 2 diabetes mellitus. There are no hypoglycemic associated symptoms. Associated symptoms include fatigue. Pertinent negatives for diabetes include no blurred vision and no foot paresthesias. Symptoms are stable. Diabetic complications include heart disease. Risk factors for coronary artery disease include dyslipidemia, diabetes mellitus, hypertension, sedentary lifestyle and post-menopausal. She is  following a generally unhealthy diet. Her overall blood glucose range is 110-130 mg/dl.  Hypertension This is a chronic problem. The current episode started more than 1 year ago. The problem has been waxing and waning since onset. The problem is uncontrolled. Associated symptoms include malaise/fatigue, peripheral edema and shortness of breath. Pertinent negatives include no blurred vision. The current treatment provides mild improvement. Identifiable causes of hypertension include a thyroid problem.  Gastroesophageal Reflux She complains of belching, coughing and heartburn. She reports no hoarse voice. This is a chronic problem. The current episode started more than 1 year ago. The problem occurs occasionally. The problem has been waxing and waning. Associated symptoms include fatigue. She has tried a PPI for the symptoms. The treatment provided moderate relief.  Thyroid Problem Presents for follow-up visit. Symptoms include dry skin and fatigue. Patient reports no constipation, depressed mood or hoarse voice. The symptoms have been stable.  Urinary Frequency  This is a chronic problem. The current episode started more than 1 year ago. The problem has been waxing and waning. Associated symptoms include frequency.  Cough This is a new problem. The current episode started in the past 7 days. The problem has been waxing and waning. The problem occurs every few minutes. The cough is Productive of sputum. Associated symptoms include heartburn, nasal congestion, postnasal drip and shortness of breath.     Review of Systems  Constitutional:  Positive for fatigue and malaise/fatigue.  HENT:  Positive for postnasal drip. Negative for hoarse voice.   Eyes:  Negative for blurred vision.  Respiratory:  Positive for cough and shortness of breath.   Gastrointestinal:  Positive for heartburn. Negative for constipation.  Genitourinary:  Positive for frequency.    Observations/Objective: No SOB or distress  noted, coarse nonproductive cough  Assessment and Plan: 1. HYPERTENSION, BENIGN  2. Gastroesophageal reflux disease without esophagitis  3. Acquired hypothyroidism  4. Type 2 diabetes mellitus with other specified complication, without long-term current use of insulin ([place])  5. OAB (overactive bladder)  6. Mixed hyperlipidemia  7. Morbid obesity due to excess calories ([place])  8. Hospital discharge follow-up   9. Acute bronchitis, unspecified organism  Pt will call office if symptoms worsen and we will send in antibiotics for her cough.  [facility] fluids Labs reviewed    I discussed the assessment and treatment plan with the patient. The patient was provided an opportunity to ask questions and all were answered. The patient agreed with the plan and demonstrated an understanding of the instructions.   The patient was advised to call back or seek an in-person evaluation if the symptoms worsen or if the condition fails to improve as anticipated.  The above assessment and management plan was discussed with the patient. The patient verbalized understanding of and has agreed to the management plan. Patient is aware to call the clinic if symptoms persist or worsen. Patient is aware when to return to the clinic for a follow-up visit. Patient educated on when it is appropriate to go to the emergency department.   Time call ended:  2:31 pm   I provided 23 minutes of  non face-to-face time during this encounter.    Evelina Dun, FNP

## 2021-01-23 ENCOUNTER — Telehealth: Payer: Self-pay | Admitting: Family

## 2021-01-23 NOTE — Telephone Encounter (Signed)
Patient aware and verbalized understanding. °

## 2021-01-26 ENCOUNTER — Other Ambulatory Visit: Payer: Self-pay | Admitting: Family

## 2021-01-26 DIAGNOSIS — I4891 Unspecified atrial fibrillation: Secondary | ICD-10-CM

## 2021-02-02 ENCOUNTER — Ambulatory Visit (INDEPENDENT_AMBULATORY_CARE_PROVIDER_SITE_OTHER): Payer: Medicare Other | Admitting: *Deleted

## 2021-02-02 DIAGNOSIS — I4891 Unspecified atrial fibrillation: Secondary | ICD-10-CM | POA: Diagnosis not present

## 2021-02-02 DIAGNOSIS — E1151 Type 2 diabetes mellitus with diabetic peripheral angiopathy without gangrene: Secondary | ICD-10-CM | POA: Diagnosis not present

## 2021-02-02 DIAGNOSIS — Z5181 Encounter for therapeutic drug level monitoring: Secondary | ICD-10-CM

## 2021-02-02 DIAGNOSIS — Z23 Encounter for immunization: Secondary | ICD-10-CM | POA: Diagnosis not present

## 2021-02-02 DIAGNOSIS — E1140 Type 2 diabetes mellitus with diabetic neuropathy, unspecified: Secondary | ICD-10-CM | POA: Diagnosis not present

## 2021-02-02 LAB — POCT INR: INR: 2.8 (ref 2.0–3.0)

## 2021-02-02 NOTE — Patient Instructions (Signed)
Continue warfarin 1 tablet daily except 1/2 tablet on Mondays Eat extra greens today Recheck in 3 weeks

## 2021-02-12 ENCOUNTER — Institutional Professional Consult (permissible substitution): Payer: Medicare Other | Admitting: Neurology

## 2021-02-24 ENCOUNTER — Ambulatory Visit (INDEPENDENT_AMBULATORY_CARE_PROVIDER_SITE_OTHER): Payer: Medicare Other | Admitting: *Deleted

## 2021-02-24 DIAGNOSIS — Z5181 Encounter for therapeutic drug level monitoring: Secondary | ICD-10-CM | POA: Diagnosis not present

## 2021-02-24 DIAGNOSIS — I4891 Unspecified atrial fibrillation: Secondary | ICD-10-CM

## 2021-02-24 LAB — POCT INR: INR: 3 (ref 2.0–3.0)

## 2021-02-24 NOTE — Patient Instructions (Signed)
Hold warfarin tonight then resume 1 tablet daily except 1/2 tablet on Mondays Eat extra greens today Recheck in 2 weeks

## 2021-02-27 ENCOUNTER — Encounter: Payer: Medicare Other | Admitting: Internal Medicine

## 2021-03-06 ENCOUNTER — Encounter: Payer: Self-pay | Admitting: Internal Medicine

## 2021-03-06 ENCOUNTER — Other Ambulatory Visit: Payer: Self-pay | Admitting: Family

## 2021-03-06 ENCOUNTER — Encounter: Payer: Medicare Other | Admitting: Internal Medicine

## 2021-03-06 ENCOUNTER — Other Ambulatory Visit: Payer: Self-pay | Admitting: Internal Medicine

## 2021-03-06 ENCOUNTER — Ambulatory Visit (INDEPENDENT_AMBULATORY_CARE_PROVIDER_SITE_OTHER): Payer: Medicare Other | Admitting: Internal Medicine

## 2021-03-06 VITALS — BP 110/70 | HR 84 | Ht 62.0 in | Wt 277.0 lb

## 2021-03-06 DIAGNOSIS — I4821 Permanent atrial fibrillation: Secondary | ICD-10-CM | POA: Diagnosis not present

## 2021-03-06 DIAGNOSIS — I441 Atrioventricular block, second degree: Secondary | ICD-10-CM | POA: Diagnosis not present

## 2021-03-06 DIAGNOSIS — I10 Essential (primary) hypertension: Secondary | ICD-10-CM

## 2021-03-06 NOTE — Progress Notes (Signed)
PCP: Sharion Balloon, FNP Primary Cardiologist: Dr Domenic Polite Primary EP:  Dr Rayann Heman  Megan [name] is a 75 y.o. female who presents today for routine electrophysiology followup.  Since last being seen in our clinic, the patient reports doing very well.  Today, she denies symptoms of palpitations, chest pain, shortness of breath,  lower extremity edema, dizziness, presyncope, or syncope.  The patient is otherwise without complaint today.   Past Medical History:  Diagnosis Date   Acute diastolic heart failure (HCC)    Anxiety state, unspecified    Atrial fibrillation (HCC)    Congestive heart failure, unspecified    Degenerative joint disease    GERD (gastroesophageal reflux disease)    Hyperlipidemia    Hypertension    Lymphedema    Obesity    OSA (obstructive sleep apnea) 06/09/2015   Persistent atrial fibrillation (HCC)     post-termination pauses with afib s/p PPM   Presence of permanent cardiac pacemaker    Sinoatrial node dysfunction ([place])    s/p PPM   Sleep apnea    compliant with CPAP   Unspecified hypothyroidism    Unspecified venous (peripheral) insufficiency    Past Surgical History:  Procedure Laterality Date   CATARACT EXTRACTION W/PHACO Left 09/09/2014   Procedure: CATARACT EXTRACTION PHACO AND INTRAOCULAR LENS PLACEMENT ([place]);  Surgeon: Tonny Branch, MD;  Location: [facility];  Service: Ophthalmology;  Laterality: Left;  CDE: 6.99   CATARACT EXTRACTION W/PHACO Right 10/07/2014   Procedure: CATARACT EXTRACTION PHACO AND INTRAOCULAR LENS PLACEMENT RIGHT EYE;  Surgeon: Tonny Branch, MD;  Location: [facility];  Service: Ophthalmology;  Laterality: Right;  CDE:5.16   PACEMAKER INSERTION     SJM by JA    ROS- all systems are reviewed and negative except as per HPI above  Current Outpatient Medications  Medication Sig Dispense Refill   albuterol (VENTOLIN HFA) 108 (90 Base) MCG/ACT inhaler INHALE 2 PUFFS BY MOUTH EVERY 6 HOURS AS NEEDED FOR WHEEZING OR SHORTNESS OF BREATH  18 g 0   atorvastatin (LIPITOR) 80 MG tablet Take 1 tablet by mouth once daily 90 tablet 0   blood glucose meter kit and supplies Dispense based on patient and insurance preference. Use up to four times daily as directed. (FOR ICD-10 E10.9, E11.9). 1 each 0   clotrimazole-betamethasone (LOTRISONE) cream Apply 1 application topically 2 (two) times daily. 45 g 1   diltiazem (CARDIZEM CD) 240 MG 24 hr capsule Take 1 capsule (240 mg total) by mouth daily. 90 capsule 1   levothyroxine (SYNTHROID) 175 MCG tablet Take 1 tablet (175 mcg total) by mouth daily before breakfast. 90 tablet 1   metoprolol tartrate (LOPRESSOR) 100 MG tablet Take 1/2 (one-half) tablet by mouth twice daily 90 tablet 2   metroNIDAZOLE (METROGEL) 1 % gel Apply topically daily. 45 g 0   mometasone (NASONEX) 50 MCG/ACT nasal spray Place 2 sprays into the nose daily. 1 each 12   nystatin cream (MYCOSTATIN) Apply topically 3 (three) times daily. Apply to affected area 3 times daily 30 g 0   Vitamin D, Ergocalciferol, (DRISDOL) 1.25 MG (50000 UNIT) CAPS capsule Take 1 capsule by mouth once a week 12 capsule 1   warfarin (COUMADIN) 3 MG tablet Take 1 tablet daily except 1/2 tablet on Saturdays or as directed 35 tablet 6   esomeprazole (NEXIUM) 40 MG capsule Take 1 capsule (40 mg total) by mouth daily. (Patient not taking: Reported on 03/06/2021) 90 capsule 1   No current facility-administered  medications for this visit.    Physical Exam: Vitals:   03/06/21 0923  BP: 110/70  Pulse: 84  SpO2: 96%  Weight: 277 lb (125.6 kg)  Height: '5\' 2"'  (1.575 m)    GEN- The patient is well appearing, alert and oriented x 3 today.   Head- normocephalic, atraumatic Eyes-  Sclera clear, conjunctiva pink Ears- hearing intact Oropharynx- clear Lungs- Clear to ausculation bilaterally, normal work of breathing Chest- pacemaker pocket is well healed Heart- Regular rate and rhythm, no murmurs, rubs or gallops, PMI not laterally displaced GI- soft,  NT, ND, + BS Extremities- no clubbing, cyanosis, or edema  Pacemaker interrogation- reviewed in detail today,  See PACEART report    Assessment and Plan:  1. Symptomatic ssecond degree heart block Normal pacemaker function See Pace Art report No changes today she is not device dependant today She has declined remote monitoring She is approaching ERI. Risks, benefits, and alternatives to PPM pulse generator replacement were discussed in detail today.  The patient understands that risks include but are not limited to bleeding, infection, pneumothorax, perforation, tamponade, vascular damage, renal failure, MI, stroke, death,  damage to his existing leads, and lead dislodgement and wishes to proceed once ERI. We will need to do in office battery checks frequently.  Once ERI, ok to schedule generator change with me. Hold coumadin 48 hours prior to the procedure.  2. Permanent afib Rate controlled On coumadin  3. Obesity Body mass index is 50.66 kg/m. Lifestyle modification is advised  4. HTN Stable No change required today  Return in a year  Thompson Grayer MD, [facility] 03/06/2021 9:38 AM

## 2021-03-06 NOTE — Patient Instructions (Signed)
Medication Instructions:  Continue all current medications.  Labwork: none  Testing/Procedures: none  Follow-Up: 3 months - device clinic (battery check)  Any Other Special Instructions Will Be Listed Below (If Applicable).   If you need a refill on your cardiac medications before your next appointment, please call your pharmacy.

## 2021-03-09 ENCOUNTER — Encounter: Payer: Self-pay | Admitting: Family

## 2021-03-09 ENCOUNTER — Ambulatory Visit (INDEPENDENT_AMBULATORY_CARE_PROVIDER_SITE_OTHER): Payer: Medicare Other | Admitting: Family

## 2021-03-09 DIAGNOSIS — H65193 Other acute nonsuppurative otitis media, bilateral: Secondary | ICD-10-CM

## 2021-03-09 MED ORDER — AMOXICILLIN-POT CLAVULANATE 875-125 MG PO TABS: 1.0000 | ORAL_TABLET | Freq: Two times a day (BID) | ORAL | 0 refills | Status: DC

## 2021-03-09 NOTE — Patient Instructions (Signed)

## 2021-03-09 NOTE — Progress Notes (Signed)
Virtual Visit  Note Due to COVID-19 pandemic this visit was conducted virtually. This visit type was conducted due to national recommendations for restrictions regarding the COVID-19 Pandemic (e.g. social distancing, sheltering in place) in an effort to limit this patient's exposure and mitigate transmission in our community. All issues noted in this document were discussed and addressed.  A physical exam was not performed with this format.  I connected with Megan [name] on 03/09/21 at 10:45 Am  by telephone and verified that I am speaking with the correct person using two identifiers. Megan [name] is currently located at home and no one is currently with her during visit. The provider, Evelina Dun, FNP is located in their office at time of visit.  I discussed the limitations, risks, security and privacy concerns of performing an evaluation and management service by telephone and the availability of in person appointments. I also discussed with the patient that there may be a patient responsible charge related to this service. The patient expressed understanding and agreed to proceed.  Megan [name], Megan [name] are scheduled for a virtual visit with your provider today.    Just as we do with appointments in the office, we must obtain your consent to participate.  Your consent will be active for this visit and any virtual visit you may have with one of our providers in the next 365 days.    If you have a MyChart account, I can also send a copy of this consent to you electronically.  All virtual visits are billed to your insurance company just like a traditional visit in the office.  As this is a virtual visit, video technology does not allow for your provider to perform a traditional examination.  This may limit your provider's ability to fully assess your condition.  If your provider identifies any concerns that need to be evaluated in person or the need to arrange testing such as labs, EKG, etc, we  will make arrangements to do so.    Although advances in technology are sophisticated, we cannot ensure that it will always work on either your end or our end.  If the connection with a video visit is poor, we may have to switch to a telephone visit.  With either a video or telephone visit, we are not always able to ensure that we have a secure connection.   I need to obtain your verbal consent now.   Are you willing to proceed with your visit today?   Megan [name] has provided verbal consent on 03/09/2021 for a virtual visit (video or telephone).   Evelina Dun, [place] 03/09/2021  10:46 AM    History and Present Illness:  Otalgia  There is pain in both ears. This is a new problem. The current episode started yesterday. The problem has been gradually worsening. There has been no fever. The pain is at a severity of 8/10. The pain is mild. Associated symptoms include coughing, headaches, rhinorrhea and a sore throat. Pertinent negatives include no ear discharge. Associated symptoms comments: Headache . She has tried nothing for the symptoms. The treatment provided no relief.     Review of Systems  HENT:  Positive for ear pain, rhinorrhea and sore throat. Negative for ear discharge.   Respiratory:  Positive for cough.   Neurological:  Positive for headaches.  All other systems reviewed and are negative.   Observations/Objective: No SOB or distress noted, no cough  Assessment and Plan: 1. Other acute nonsuppurative otitis media  of both ears, recurrence not specified Pt will take home COVID test to rule out out - Take meds as prescribed - Use a cool mist humidifier  -Use saline nose sprays frequently -Force fluids -For any cough or congestion  Use plain Mucinex- regular strength or max strength is fine -For fever or aces or pains- take tylenol or ibuprofen. -PT does not want to be flu tested Call if symptoms worsen or do not improve  - amoxicillin-clavulanate (AUGMENTIN) 875-125  MG tablet; Take 1 tablet by mouth 2 (two) times daily.  Dispense: 14 tablet; Refill: 0     I discussed the assessment and treatment plan with the patient. The patient was provided an opportunity to ask questions and all were answered. The patient agreed with the plan and demonstrated an understanding of the instructions.   The patient was advised to call back or seek an in-person evaluation if the symptoms worsen or if the condition fails to improve as anticipated.  The above assessment and management plan was discussed with the patient. The patient verbalized understanding of and has agreed to the management plan. Patient is aware to call the clinic if symptoms persist or worsen. Patient is aware when to return to the clinic for a follow-up visit. Patient educated on when it is appropriate to go to the emergency department.   Time call ended:  10:57 AM   I provided 13 minutes of  non face-to-face time during this encounter.    Evelina Dun, FNP

## 2021-03-18 ENCOUNTER — Ambulatory Visit (INDEPENDENT_AMBULATORY_CARE_PROVIDER_SITE_OTHER): Payer: Medicare Other | Admitting: *Deleted

## 2021-03-18 DIAGNOSIS — I4891 Unspecified atrial fibrillation: Secondary | ICD-10-CM | POA: Diagnosis not present

## 2021-03-18 DIAGNOSIS — Z5181 Encounter for therapeutic drug level monitoring: Secondary | ICD-10-CM | POA: Diagnosis not present

## 2021-03-18 LAB — POCT INR: INR: 2.5 (ref 2.0–3.0)

## 2021-03-18 NOTE — Patient Instructions (Signed)
Continue warfarin 1 tablet daily except 1/2 tablet on Mondays Eat extra greens today Recheck in 3 weeks

## 2021-03-19 ENCOUNTER — Other Ambulatory Visit: Payer: Self-pay | Admitting: Family

## 2021-03-19 DIAGNOSIS — E782 Mixed hyperlipidemia: Secondary | ICD-10-CM

## 2021-03-20 ENCOUNTER — Ambulatory Visit (INDEPENDENT_AMBULATORY_CARE_PROVIDER_SITE_OTHER): Payer: Medicare Other | Admitting: Family

## 2021-03-20 ENCOUNTER — Encounter: Payer: Self-pay | Admitting: Family

## 2021-03-20 DIAGNOSIS — B3731 Acute candidiasis of vulva and vagina: Secondary | ICD-10-CM | POA: Diagnosis not present

## 2021-03-20 MED ORDER — FLUCONAZOLE 150 MG PO TABS
150.0000 mg | ORAL_TABLET | ORAL | 0 refills | Status: DC | PRN
Start: 2021-03-20 — End: 2021-04-03

## 2021-03-20 NOTE — Progress Notes (Signed)
Virtual Visit  Note Due to COVID-19 pandemic this visit was conducted virtually. This visit type was conducted due to national recommendations for restrictions regarding the COVID-19 Pandemic (e.g. social distancing, sheltering in place) in an effort to limit this patient's exposure and mitigate transmission in our community. All issues noted in this document were discussed and addressed.  A physical exam was not performed with this format.  I connected with Megan [name] on 03/20/21 at 9:36 AM  by telephone and verified that I am speaking with the correct person using two identifiers. Megan [name] is currently located at home and no one is currently with her during visit. The provider, Evelina Dun, FNP is located in their office at time of visit.  I discussed the limitations, risks, security and privacy concerns of performing an evaluation and management service by telephone and the availability of in person appointments. I also discussed with the patient that there may be a patient responsible charge related to this service. The patient expressed understanding and agreed to proceed.  Megan [name], Megan [name] are scheduled for a virtual visit with your provider today.    Just as we do with appointments in the office, we must obtain your consent to participate.  Your consent will be active for this visit and any virtual visit you may have with one of our providers in the next 365 days.    If you have a MyChart account, I can also send a copy of this consent to you electronically.  All virtual visits are billed to your insurance company just like a traditional visit in the office.  As this is a virtual visit, video technology does not allow for your provider to perform a traditional examination.  This may limit your provider's ability to fully assess your condition.  If your provider identifies any concerns that need to be evaluated in person or the need to arrange testing such as labs, EKG, etc, we  will make arrangements to do so.    Although advances in technology are sophisticated, we cannot ensure that it will always work on either your end or our end.  If the connection with a video visit is poor, we may have to switch to a telephone visit.  With either a video or telephone visit, we are not always able to ensure that we have a secure connection.   I need to obtain your verbal consent now.   Are you willing to proceed with your visit today?   Megan [name] has provided verbal consent on 03/20/2021 for a virtual visit (video or telephone).   Evelina Dun, [place] 03/20/2021  9:38 AM    History and Present Illness:  Dysuria  This is a new problem. The current episode started in the past 7 days. The problem occurs intermittently. The quality of the pain is described as burning. The patient is experiencing no pain. Pertinent negatives include no discharge, flank pain, frequency, hematuria, urgency or vomiting.  Vaginal Pain The patient's primary symptoms include genital itching. This is a new problem. Associated symptoms include dysuria. Pertinent negatives include no flank pain, frequency, hematuria, urgency or vomiting.     Review of Systems  Gastrointestinal:  Negative for vomiting.  Genitourinary:  Positive for dysuria and vaginal pain. Negative for flank pain, frequency, hematuria and urgency.    Observations/Objective: No SOB or distress noted   Assessment and Plan: 1. Vagina, candidiasis Start diflucan  Keep clean and dry Cotton underwear Follow up if symptoms worsen  or do not improve  - fluconazole (DIFLUCAN) 150 MG tablet; Take 1 tablet (150 mg total) by mouth every three (3) days as needed.  Dispense: 3 tablet; Refill: 0     I discussed the assessment and treatment plan with the patient. The patient was provided an opportunity to ask questions and all were answered. The patient agreed with the plan and demonstrated an understanding of the instructions.   The  patient was advised to call back or seek an in-person evaluation if the symptoms worsen or if the condition fails to improve as anticipated.  The above assessment and management plan was discussed with the patient. The patient verbalized understanding of and has agreed to the management plan. Patient is aware to call the clinic if symptoms persist or worsen. Patient is aware when to return to the clinic for a follow-up visit. Patient educated on when it is appropriate to go to the emergency department.   Time call ended:  9:47 AM   I provided 11 minutes of  non face-to-face time during this encounter.    Evelina Dun, FNP

## 2021-04-02 ENCOUNTER — Ambulatory Visit (INDEPENDENT_AMBULATORY_CARE_PROVIDER_SITE_OTHER): Payer: Medicare Other | Admitting: Family

## 2021-04-02 ENCOUNTER — Encounter: Payer: Self-pay | Admitting: Family

## 2021-04-02 VITALS — BP 135/72 | HR 84 | Temp 97.6°F | Ht 62.0 in

## 2021-04-02 DIAGNOSIS — E039 Hypothyroidism, unspecified: Secondary | ICD-10-CM | POA: Diagnosis not present

## 2021-04-02 DIAGNOSIS — I4891 Unspecified atrial fibrillation: Secondary | ICD-10-CM | POA: Diagnosis not present

## 2021-04-02 DIAGNOSIS — R300 Dysuria: Secondary | ICD-10-CM | POA: Diagnosis not present

## 2021-04-02 DIAGNOSIS — K219 Gastro-esophageal reflux disease without esophagitis: Secondary | ICD-10-CM | POA: Diagnosis not present

## 2021-04-02 DIAGNOSIS — I5032 Chronic diastolic (congestive) heart failure: Secondary | ICD-10-CM | POA: Diagnosis not present

## 2021-04-02 DIAGNOSIS — N3281 Overactive bladder: Secondary | ICD-10-CM | POA: Diagnosis not present

## 2021-04-02 DIAGNOSIS — I739 Peripheral vascular disease, unspecified: Secondary | ICD-10-CM | POA: Diagnosis not present

## 2021-04-02 DIAGNOSIS — E782 Mixed hyperlipidemia: Secondary | ICD-10-CM

## 2021-04-02 DIAGNOSIS — I10 Essential (primary) hypertension: Secondary | ICD-10-CM

## 2021-04-02 DIAGNOSIS — Z7901 Long term (current) use of anticoagulants: Secondary | ICD-10-CM

## 2021-04-02 DIAGNOSIS — E1169 Type 2 diabetes mellitus with other specified complication: Secondary | ICD-10-CM

## 2021-04-02 DIAGNOSIS — N3001 Acute cystitis with hematuria: Secondary | ICD-10-CM

## 2021-04-02 LAB — URINALYSIS, COMPLETE
Bilirubin, UA: NEGATIVE
Glucose, UA: NEGATIVE
Ketones, UA: NEGATIVE
Nitrite, UA: POSITIVE — AB
Protein,UA: NEGATIVE
Specific Gravity, UA: 1.025 (ref 1.005–1.030)
Urobilinogen, Ur: 0.2 mg/dL (ref 0.2–1.0)
pH, UA: 5.5 (ref 5.0–7.5)

## 2021-04-02 LAB — BAYER DCA HB A1C WAIVED: HB A1C (BAYER DCA - WAIVED): 5.8 % — ABNORMAL HIGH (ref 4.8–5.6)

## 2021-04-02 LAB — MICROSCOPIC EXAMINATION
Renal Epithel, UA: NONE SEEN /hpf
WBC, UA: >30 /hpf — AB (ref 0–5)

## 2021-04-02 MED ORDER — CEPHALEXIN 500 MG PO CAPS
500.0000 mg | ORAL_CAPSULE | Freq: Two times a day (BID) | ORAL | 0 refills | Status: DC
Start: 2021-04-02 — End: 2021-05-21

## 2021-04-02 NOTE — Patient Instructions (Signed)
Urinary Tract Infection, Adult A urinary tract infection (UTI) is an infection of any part of the urinary tract. The urinary tract includes the kidneys, ureters, bladder, and urethra. These organs make, store, and get rid of urine in the body. An upper UTI affects the ureters and kidneys. A lower UTI affects the bladder and urethra. What are the causes? Most urinary tract infections are caused by bacteria in your genital area around your urethra, where urine leaves your body. These bacteria grow and cause inflammation of your urinary tract. What increases the risk? You are more likely to develop this condition if: You have a urinary catheter that stays in place. You are not able to control when you urinate or have a bowel movement (incontinence). You are female and you: Use a spermicide or diaphragm for birth control. Have low estrogen levels. Are pregnant. You have certain genes that increase your risk. You are sexually active. You take antibiotic medicines. You have a condition that causes your flow of urine to slow down, such as: An enlarged prostate, if you are female. Blockage in your urethra. A kidney stone. A nerve condition that affects your bladder control (neurogenic bladder). Not getting enough to drink, or not urinating often. You have certain medical conditions, such as: Diabetes. A weak disease-fighting system (immunesystem). Sickle cell disease. Gout. Spinal cord injury. What are the signs or symptoms? Symptoms of this condition include: Needing to urinate right away (urgency). Frequent urination. This may include small amounts of urine each time you urinate. Pain or burning with urination. Blood in the urine. Urine that smells bad or unusual. Trouble urinating. Cloudy urine. Vaginal discharge, if you are female. Pain in the abdomen or the lower back. You may also have: Vomiting or a decreased appetite. Confusion. Irritability or tiredness. A fever or  chills. Diarrhea. The first symptom in older adults may be confusion. In some cases, they may not have any symptoms until the infection has worsened. How is this diagnosed? This condition is diagnosed based on your medical history and a physical exam. You may also have other tests, including: Urine tests. Blood tests. Tests for STIs (sexually transmitted infections). If you have had more than one UTI, a cystoscopy or imaging studies may be done to determine the cause of the infections. How is this treated? Treatment for this condition includes: Antibiotic medicine. Over-the-counter medicines to treat discomfort. Drinking enough water to stay hydrated. If you have frequent infections or have other conditions such as a kidney stone, you may need to see a health care provider who specializes in the urinary tract (urologist). In rare cases, urinary tract infections can cause sepsis. Sepsis is a life-threatening condition that occurs when the body responds to an infection. Sepsis is treated in the hospital with IV antibiotics, fluids, and other medicines. Follow these instructions at home: Medicines Take over-the-counter and prescription medicines only as told by your health care provider. If you were prescribed an antibiotic medicine, take it as told by your health care provider. Do not stop using the antibiotic even if you start to feel better. General instructions Make sure you: Empty your bladder often and completely. Do not hold urine for long periods of time. Empty your bladder after sex. Wipe from front to back after urinating or having a bowel movement if you are female. Use each tissue only one time when you wipe. Drink enough fluid to keep your urine pale yellow. Keep all follow-up visits. This is important. Contact a health care provider   if: Your symptoms do not get better after 1-2 days. Your symptoms go away and then return. Get help right away if: You have severe pain in your  back or your lower abdomen. You have a fever or chills. You have nausea or vomiting. Summary A urinary tract infection (UTI) is an infection of any part of the urinary tract, which includes the kidneys, ureters, bladder, and urethra. Most urinary tract infections are caused by bacteria in your genital area. Treatment for this condition often includes antibiotic medicines. If you were prescribed an antibiotic medicine, take it as told by your health care provider. Do not stop using the antibiotic even if you start to feel better. Keep all follow-up visits. This is important. This information is not intended to replace advice given to you by your health care provider. Make sure you discuss any questions you have with your health care provider. Document Revised: 10/26/2019 Document Reviewed: 10/26/2019 Elsevier Patient Education  2022 Else[employer] Inc.  

## 2021-04-02 NOTE — Progress Notes (Signed)
Subjective:    Patient ID: Megan [name], female    DOB: Jun 16, 1945, 76 y.o.   MRN: [id number]  Chief Complaint  Patient presents with   Follow-up   Dysuria   Pt presents to the toffice today for chronic follow up.  Pt is followed by Cardiologists annually for pacemaker, CHF, A Fib. She also goes to the coumadin clinic once a month.  Dysuria  This is a recurrent problem. The current episode started 1 to 4 weeks ago. The problem has been waxing and waning. The quality of the pain is described as burning. The patient is experiencing no pain. Associated symptoms include frequency, hesitancy and urgency. Pertinent negatives include no nausea or vomiting. Associated symptoms comments: Foul urine. She has tried increased fluids for the symptoms. The treatment provided mild relief.  Hypertension This is a chronic problem. The current episode started more than 1 year ago. The problem has been resolved since onset. The problem is controlled. Associated symptoms include shortness of breath. Pertinent negatives include no blurred vision, malaise/fatigue or peripheral edema. Risk factors for coronary artery disease include obesity, dyslipidemia, diabetes mellitus and smoking/tobacco exposure. The current treatment provides moderate improvement. There is no history of heart failure. Identifiable causes of hypertension include a thyroid problem.  Gastroesophageal Reflux She complains of belching, heartburn and a hoarse voice. She reports no nausea. This is a chronic problem. The current episode started more than 1 year ago. The problem occurs occasionally. Associated symptoms include fatigue. Risk factors include obesity. She has tried a PPI for the symptoms. The treatment provided moderate relief.  Diabetes She presents for her follow-up diabetic visit. She has type 2 diabetes mellitus. Hypoglycemia symptoms include nervousness/anxiousness. Associated symptoms include fatigue. Pertinent negatives for  diabetes include no blurred vision and no foot paresthesias. Symptoms are stable. Diabetic complications include heart disease. Pertinent negatives for diabetic complications include no peripheral neuropathy. Risk factors for coronary artery disease include dyslipidemia, diabetes mellitus, hypertension, sedentary lifestyle and post-menopausal. She is following a generally unhealthy diet. Her overall blood glucose range is 110-130 mg/dl. Eye exam is not current.  Thyroid Problem Presents for follow-up visit. Symptoms include anxiety, constipation, fatigue and hoarse voice. The symptoms have been stable. Her past medical history is significant for hyperlipidemia. There is no history of heart failure.  Hyperlipidemia This is a chronic problem. The current episode started more than 1 year ago. Exacerbating diseases include obesity. Associated symptoms include shortness of breath. Current antihyperlipidemic treatment includes statins. The current treatment provides moderate improvement of lipids.     Review of Systems  Constitutional:  Positive for fatigue. Negative for malaise/fatigue.  HENT:  Positive for hoarse voice.   Eyes:  Negative for blurred vision.  Respiratory:  Positive for shortness of breath.   Gastrointestinal:  Positive for constipation and heartburn. Negative for nausea and vomiting.  Genitourinary:  Positive for dysuria, frequency, hesitancy and urgency.  Psychiatric/Behavioral:  The patient is nervous/anxious.   All other systems reviewed and are negative.     Objective:   Physical Exam Vitals reviewed.  Constitutional:      General: She is not in acute distress.    Appearance: She is well-developed. She is obese.  HENT:     Head: Normocephalic and atraumatic.     Right Ear: Tympanic membrane normal.     Left Ear: Tympanic membrane normal.  Eyes:     Pupils: Pupils are equal, round, and reactive to light.  Neck:     Thyroid: No  thyromegaly.  Cardiovascular:     Rate  and Rhythm: Normal rate and regular rhythm.     Heart sounds: Normal heart sounds. No murmur heard. Pulmonary:     Effort: Pulmonary effort is normal. No respiratory distress.     Breath sounds: Normal breath sounds. No wheezing.  Abdominal:     General: Bowel sounds are normal. There is no distension.     Palpations: Abdomen is soft.     Tenderness: There is no abdominal tenderness.  Musculoskeletal:        General: No tenderness. Normal range of motion.     Cervical back: Normal range of motion and neck supple.     Right lower leg: Edema present.     Left lower leg: Edema present.  Skin:    General: Skin is warm and dry.  Neurological:     Mental Status: She is alert and oriented to person, place, and time.     Cranial Nerves: No cranial nerve deficit.     Motor: Weakness (sitting in wheelchair) present.     Deep Tendon Reflexes: Reflexes are normal and symmetric.  Psychiatric:        Behavior: Behavior normal.        Thought Content: Thought content normal.        Judgment: Judgment normal.      BP 135/72    Pulse 84    Temp 97.6 F (36.4 C) (Temporal)    Ht '5\' 2"'  (1.575 m)    SpO2 97%    BMI 50.66 kg/m      Assessment & Plan:  Megan [name] comes in today with chief complaint of Follow-up and Dysuria   Diagnosis and orders addressed:  1. Dysuria - Urinalysis, Complete - Urine Culture - CMP14+EGFR - CBC with Differential/Platelet  2. HYPERTENSION, BENIGN - CMP14+EGFR - CBC with Differential/Platelet  3. Atrial fibrillation, unspecified type ([place]) - CMP14+EGFR - CBC with Differential/Platelet  4. Gastroesophageal reflux disease without esophagitis  - CMP14+EGFR - CBC with Differential/Platelet  5. Type 2 diabetes mellitus with other specified complication, without long-term current use of insulin (HCC)  - CMP14+EGFR - CBC with Differential/Platelet - Bayer DCA Hb A1c Waived  6. Acquired hypothyroidism - CMP14+EGFR - CBC with  Differential/Platelet - TSH  7. OAB (overactive bladder) - CMP14+EGFR - CBC with Differential/Platelet  8. Long term (current) use of anticoagulants - CMP14+EGFR - CBC with Differential/Platelet  9. Mixed hyperlipidemia - CMP14+EGFR - CBC with Differential/Platelet  10. Morbid obesity due to excess calories (HCC)  - CMP14+EGFR - CBC with Differential/Platelet  11. Chronic diastolic congestive heart failure (HCC) - CMP14+EGFR - CBC with Differential/Platelet  12. PVD (peripheral vascular disease) with claudication (HCC) - CMP14+EGFR - CBC with Differential/Platelet  13. Acute cystitis with hematuria  - cephALEXin (KEFLEX) 500 MG capsule; Take 1 capsule (500 mg total) by mouth 2 (two) times daily.  Dispense: 14 capsule; Refill: 0 - CMP14+EGFR - CBC with Differential/Platelet   Labs pending Health Maintenance reviewed Diet and exercise encouraged  Follow up plan: 3 months    Evelina Dun, FNP

## 2021-04-03 ENCOUNTER — Other Ambulatory Visit: Payer: Self-pay | Admitting: Family

## 2021-04-03 LAB — CMP14+EGFR
ALT: 17 IU/L (ref 0–32)
AST: 15 IU/L (ref 0–40)
Albumin/Globulin Ratio: 1.4 (ref 1.2–2.2)
Albumin: 3.8 g/dL (ref 3.7–4.7)
Alkaline Phosphatase: 172 IU/L — ABNORMAL HIGH (ref 44–121)
BUN/Creatinine Ratio: 17 (ref 12–28)
BUN: 15 mg/dL (ref 8–27)
Bilirubin Total: 1.2 mg/dL (ref 0.0–1.2)
CO2: 22 mmol/L (ref 20–29)
Calcium: 9 mg/dL (ref 8.7–10.3)
Chloride: 101 mmol/L (ref 96–106)
Creatinine, Ser: 0.87 mg/dL (ref 0.57–1.00)
Globulin, Total: 2.8 g/dL (ref 1.5–4.5)
Glucose: 129 mg/dL — ABNORMAL HIGH (ref 70–99)
Potassium: 4.8 mmol/L (ref 3.5–5.2)
Sodium: 140 mmol/L (ref 134–144)
Total Protein: 6.6 g/dL (ref 6.0–8.5)
eGFR: 69 mL/min/{1.73_m2} (ref >59)

## 2021-04-03 LAB — CBC WITH DIFFERENTIAL/PLATELET
Basophils Absolute: 0.1 10*3/uL (ref 0.0–0.2)
Basos: 0 %
EOS (ABSOLUTE): 0.1 10*3/uL (ref 0.0–0.4)
Eos: 1 %
Hematocrit: 46.3 % (ref 34.0–46.6)
Hemoglobin: 15.4 g/dL (ref 11.1–15.9)
Immature Grans (Abs): 0.1 10*3/uL (ref 0.0–0.1)
Immature Granulocytes: 1 %
Lymphocytes Absolute: 1.3 10*3/uL (ref 0.7–3.1)
Lymphs: 9 %
MCH: 30.8 pg (ref 26.6–33.0)
MCHC: 33.3 g/dL (ref 31.5–35.7)
MCV: 93 fL (ref 79–97)
Monocytes Absolute: 1 10*3/uL — ABNORMAL HIGH (ref 0.1–0.9)
Monocytes: 7 %
Neutrophils Absolute: 12 10*3/uL — ABNORMAL HIGH (ref 1.4–7.0)
Neutrophils: 82 %
Platelets: 258 10*3/uL (ref 150–450)
RBC: 5 x10E6/uL (ref 3.77–5.28)
RDW: 13.1 % (ref 11.7–15.4)
WBC: 14.5 10*3/uL — ABNORMAL HIGH (ref 3.4–10.8)

## 2021-04-03 LAB — TSH: TSH: 0.621 u[IU]/mL (ref 0.450–4.500)

## 2021-04-03 MED ORDER — LEVOTHYROXINE SODIUM 175 MCG PO TABS: 175.0000 ug | ORAL_TABLET | Freq: Every day | ORAL | 1 refills | Status: DC

## 2021-04-03 NOTE — Addendum Note (Signed)
Addended by: Everlean Cherry on: 04/03/2021 09:23 AM   Modules accepted: Orders

## 2021-04-05 LAB — URINE CULTURE

## 2021-04-08 ENCOUNTER — Other Ambulatory Visit: Payer: Self-pay

## 2021-04-08 ENCOUNTER — Ambulatory Visit (INDEPENDENT_AMBULATORY_CARE_PROVIDER_SITE_OTHER): Payer: Medicare Other | Admitting: *Deleted

## 2021-04-08 DIAGNOSIS — I4891 Unspecified atrial fibrillation: Secondary | ICD-10-CM

## 2021-04-08 DIAGNOSIS — Z5181 Encounter for therapeutic drug level monitoring: Secondary | ICD-10-CM

## 2021-04-08 LAB — POCT INR: INR: 2.2 (ref 2.0–3.0)

## 2021-04-08 NOTE — Patient Instructions (Signed)
Continue warfarin 1 tablet daily except 1/2 tablet on Mondays Eat extra greens today Recheck in 3 weeks

## 2021-04-23 ENCOUNTER — Other Ambulatory Visit: Payer: Self-pay

## 2021-04-23 ENCOUNTER — Ambulatory Visit (INDEPENDENT_AMBULATORY_CARE_PROVIDER_SITE_OTHER): Payer: Medicare Other | Admitting: *Deleted

## 2021-04-23 DIAGNOSIS — I4891 Unspecified atrial fibrillation: Secondary | ICD-10-CM | POA: Diagnosis not present

## 2021-04-23 DIAGNOSIS — Z5181 Encounter for therapeutic drug level monitoring: Secondary | ICD-10-CM | POA: Diagnosis not present

## 2021-04-23 LAB — POCT INR: INR: 2.3 (ref 2.0–3.0)

## 2021-04-23 NOTE — Patient Instructions (Signed)
Continue warfarin 1 tablet daily except 1/2 tablet on Mondays Eat extra greens today Recheck in 3 weeks

## 2021-05-14 ENCOUNTER — Telehealth: Payer: Self-pay | Admitting: Internal Medicine

## 2021-05-14 NOTE — Telephone Encounter (Signed)
Patient calling asking that lisa gives her a call back please.

## 2021-05-14 NOTE — Telephone Encounter (Signed)
Spoke with patient.  She had to cancel today's INR appt because she is sick.  Appt rescheduled for 05/21/21 at 10:00am.

## 2021-05-17 ENCOUNTER — Encounter (HOSPITAL_COMMUNITY): Payer: Self-pay

## 2021-05-17 ENCOUNTER — Inpatient Hospital Stay (HOSPITAL_COMMUNITY)
Admission: EM | Admit: 2021-05-17 | Discharge: 2021-05-21 | DRG: 871 | Disposition: A | Discharge status: Skilled Nursing Facility | Payer: Medicare Other | Attending: Internal Medicine | Admitting: Internal Medicine

## 2021-05-17 ENCOUNTER — Emergency Department (HOSPITAL_COMMUNITY): Payer: Medicare Other

## 2021-05-17 ENCOUNTER — Other Ambulatory Visit: Payer: Self-pay

## 2021-05-17 DIAGNOSIS — R001 Bradycardia, unspecified: Secondary | ICD-10-CM | POA: Diagnosis present

## 2021-05-17 DIAGNOSIS — N3000 Acute cystitis without hematuria: Secondary | ICD-10-CM | POA: Diagnosis present

## 2021-05-17 DIAGNOSIS — Z7901 Long term (current) use of anticoagulants: Secondary | ICD-10-CM

## 2021-05-17 DIAGNOSIS — R531 Weakness: Secondary | ICD-10-CM | POA: Diagnosis present

## 2021-05-17 DIAGNOSIS — E039 Hypothyroidism, unspecified: Secondary | ICD-10-CM | POA: Diagnosis not present

## 2021-05-17 DIAGNOSIS — I10 Essential (primary) hypertension: Secondary | ICD-10-CM | POA: Diagnosis present

## 2021-05-17 DIAGNOSIS — Z79890 Hormone replacement therapy (postmenopausal): Secondary | ICD-10-CM | POA: Diagnosis not present

## 2021-05-17 DIAGNOSIS — E782 Mixed hyperlipidemia: Secondary | ICD-10-CM | POA: Diagnosis not present

## 2021-05-17 DIAGNOSIS — I495 Sick sinus syndrome: Secondary | ICD-10-CM | POA: Diagnosis present

## 2021-05-17 DIAGNOSIS — K219 Gastro-esophageal reflux disease without esophagitis: Secondary | ICD-10-CM | POA: Diagnosis not present

## 2021-05-17 DIAGNOSIS — N3001 Acute cystitis with hematuria: Secondary | ICD-10-CM | POA: Diagnosis present

## 2021-05-17 DIAGNOSIS — Z66 Do not resuscitate: Secondary | ICD-10-CM | POA: Diagnosis present

## 2021-05-17 DIAGNOSIS — Z950 Presence of cardiac pacemaker: Secondary | ICD-10-CM

## 2021-05-17 DIAGNOSIS — Z743 Need for continuous supervision: Secondary | ICD-10-CM | POA: Diagnosis not present

## 2021-05-17 DIAGNOSIS — R5381 Other malaise: Secondary | ICD-10-CM | POA: Diagnosis not present

## 2021-05-17 DIAGNOSIS — Z823 Family history of stroke: Secondary | ICD-10-CM

## 2021-05-17 DIAGNOSIS — Z7401 Bed confinement status: Secondary | ICD-10-CM | POA: Diagnosis not present

## 2021-05-17 DIAGNOSIS — R279 Unspecified lack of coordination: Secondary | ICD-10-CM | POA: Diagnosis not present

## 2021-05-17 DIAGNOSIS — U071 COVID-19: Secondary | ICD-10-CM | POA: Diagnosis present

## 2021-05-17 DIAGNOSIS — R6520 Severe sepsis without septic shock: Secondary | ICD-10-CM

## 2021-05-17 DIAGNOSIS — R739 Hyperglycemia, unspecified: Secondary | ICD-10-CM | POA: Diagnosis not present

## 2021-05-17 DIAGNOSIS — E785 Hyperlipidemia, unspecified: Secondary | ICD-10-CM | POA: Diagnosis present

## 2021-05-17 DIAGNOSIS — R000 Tachycardia, unspecified: Secondary | ICD-10-CM | POA: Diagnosis present

## 2021-05-17 DIAGNOSIS — Z87891 Personal history of nicotine dependence: Secondary | ICD-10-CM | POA: Diagnosis not present

## 2021-05-17 DIAGNOSIS — Z79899 Other long term (current) drug therapy: Secondary | ICD-10-CM

## 2021-05-17 DIAGNOSIS — I4891 Unspecified atrial fibrillation: Secondary | ICD-10-CM | POA: Diagnosis not present

## 2021-05-17 DIAGNOSIS — M6281 Muscle weakness (generalized): Secondary | ICD-10-CM | POA: Diagnosis present

## 2021-05-17 DIAGNOSIS — R2689 Other abnormalities of gait and mobility: Secondary | ICD-10-CM | POA: Diagnosis present

## 2021-05-17 DIAGNOSIS — A419 Sepsis, unspecified organism: Secondary | ICD-10-CM | POA: Diagnosis present

## 2021-05-17 DIAGNOSIS — I4821 Permanent atrial fibrillation: Secondary | ICD-10-CM | POA: Diagnosis present

## 2021-05-17 DIAGNOSIS — Z6841 Body Mass Index (BMI) 40.0 and over, adult: Secondary | ICD-10-CM

## 2021-05-17 DIAGNOSIS — I890 Lymphedema, not elsewhere classified: Secondary | ICD-10-CM | POA: Diagnosis present

## 2021-05-17 DIAGNOSIS — R262 Difficulty in walking, not elsewhere classified: Secondary | ICD-10-CM | POA: Diagnosis present

## 2021-05-17 DIAGNOSIS — Z0389 Encounter for observation for other suspected diseases and conditions ruled out: Secondary | ICD-10-CM | POA: Diagnosis not present

## 2021-05-17 DIAGNOSIS — Z820 Family history of epilepsy and other diseases of the nervous system: Secondary | ICD-10-CM

## 2021-05-17 DIAGNOSIS — A4151 Sepsis due to Escherichia coli [E. coli]: Secondary | ICD-10-CM | POA: Diagnosis not present

## 2021-05-17 LAB — CBC
HCT: 53.5 % — ABNORMAL HIGH (ref 36.0–46.0)
Hemoglobin: 16.7 g/dL — ABNORMAL HIGH (ref 12.0–15.0)
MCH: 31.2 pg (ref 26.0–34.0)
MCHC: 31.2 g/dL (ref 30.0–36.0)
MCV: 99.8 fL (ref 80.0–100.0)
Platelets: 252 10*3/uL (ref 150–400)
RBC: 5.36 MIL/uL — ABNORMAL HIGH (ref 3.87–5.11)
RDW: 14.2 % (ref 11.5–15.5)
WBC: 19.6 10*3/uL — ABNORMAL HIGH (ref 4.0–10.5)
nRBC: 0 % (ref 0.0–0.2)

## 2021-05-17 LAB — URINALYSIS, ROUTINE W REFLEX MICROSCOPIC
Bilirubin Urine: NEGATIVE
Glucose, UA: NEGATIVE mg/dL
Ketones, ur: 5 mg/dL — AB
Nitrite: NEGATIVE
Protein, ur: 30 mg/dL — AB
Specific Gravity, Urine: 1.025 (ref 1.005–1.030)
WBC, UA: >50 WBC/hpf — ABNORMAL HIGH (ref 0–5)
pH: 5 (ref 5.0–8.0)

## 2021-05-17 LAB — HEPATIC FUNCTION PANEL
ALT: 25 U/L (ref 0–44)
AST: 27 U/L (ref 15–41)
Albumin: 3.1 g/dL — ABNORMAL LOW (ref 3.5–5.0)
Alkaline Phosphatase: 109 U/L (ref 38–126)
Bilirubin, Direct: 0.2 mg/dL (ref 0.0–0.2)
Indirect Bilirubin: 0.8 mg/dL (ref 0.3–0.9)
Total Bilirubin: 1 mg/dL (ref 0.3–1.2)
Total Protein: 6.9 g/dL (ref 6.5–8.1)

## 2021-05-17 LAB — BASIC METABOLIC PANEL
Anion gap: 15 (ref 5–15)
BUN: 14 mg/dL (ref 8–23)
CO2: 21 mmol/L — ABNORMAL LOW (ref 22–32)
Calcium: 9.1 mg/dL (ref 8.9–10.3)
Chloride: 98 mmol/L (ref 98–111)
Creatinine, Ser: 1.08 mg/dL — ABNORMAL HIGH (ref 0.44–1.00)
GFR, Estimated: 53 mL/min — ABNORMAL LOW (ref >60)
Glucose, Bld: 219 mg/dL — ABNORMAL HIGH (ref 70–99)
Potassium: 4.8 mmol/L (ref 3.5–5.1)
Sodium: 134 mmol/L — ABNORMAL LOW (ref 135–145)

## 2021-05-17 LAB — RESP PANEL BY RT-PCR (FLU A&B, COVID) ARPGX2
Influenza A by PCR: NEGATIVE
Influenza B by PCR: NEGATIVE
SARS Coronavirus 2 by RT PCR: POSITIVE — AB

## 2021-05-17 LAB — LACTIC ACID, PLASMA
Lactic Acid, Venous: 4.2 mmol/L (ref 0.5–1.9)
Lactic Acid, Venous: 2.6 mmol/L (ref 0.5–1.9)

## 2021-05-17 LAB — PROTIME-INR
INR: 3.4 — ABNORMAL HIGH (ref 0.8–1.2)
Prothrombin Time: 34.4 seconds — ABNORMAL HIGH (ref 11.4–15.2)

## 2021-05-17 MED ORDER — WARFARIN SODIUM 2.5 MG PO TABS: 2.5000 mg | ORAL_TABLET | Freq: Once | ORAL | Status: DC

## 2021-05-17 MED ORDER — ONDANSETRON HCL 4 MG/2ML IJ SOLN
4.0000 mg | Freq: Four times a day (QID) | INTRAMUSCULAR | Status: DC | PRN
Start: 2021-05-17 — End: 2021-05-22

## 2021-05-17 MED ORDER — METHYLPREDNISOLONE SODIUM SUCC 125 MG IJ SOLR
125.0000 mg | Freq: Once | INTRAMUSCULAR | Status: AC
  Administered 2021-05-17: 125 mg via INTRAVENOUS
  Filled 2021-05-17: qty 2

## 2021-05-17 MED ORDER — SODIUM CHLORIDE 0.9 % IV BOLUS (SEPSIS)
1000.0000 mL | Freq: Once | INTRAVENOUS | Status: AC
  Administered 2021-05-17: 1000 mL via INTRAVENOUS

## 2021-05-17 MED ORDER — ONDANSETRON HCL 4 MG PO TABS: 4.0000 mg | ORAL_TABLET | Freq: Four times a day (QID) | ORAL | Status: DC | PRN

## 2021-05-17 MED ORDER — LACTATED RINGERS IV SOLN: INTRAVENOUS | Status: DC

## 2021-05-17 MED ORDER — WARFARIN - PHYSICIAN DOSING INPATIENT
Freq: Every day | Status: DC
Start: 2021-05-18 — End: 2021-05-18

## 2021-05-17 MED ORDER — NIRMATRELVIR/RITONAVIR (PAXLOVID) TABLET (RENAL DOSING)
2.0000 | ORAL_TABLET | Freq: Two times a day (BID) | ORAL | Status: DC
  Filled 2021-05-17: qty 20

## 2021-05-17 MED ORDER — FLUTICASONE PROPIONATE 50 MCG/ACT NA SUSP
1.0000 | Freq: Every day | NASAL | Status: DC
  Administered 2021-05-18 – 2021-05-21 (×4): 1 via NASAL
  Filled 2021-05-17: qty 16

## 2021-05-17 MED ORDER — SODIUM CHLORIDE 0.9 % IV BOLUS
1000.0000 mL | Freq: Once | INTRAVENOUS | Status: AC
  Administered 2021-05-17: 1000 mL via INTRAVENOUS

## 2021-05-17 MED ORDER — NIRMATRELVIR/RITONAVIR (PAXLOVID)TABLET: 3.0000 | ORAL_TABLET | Freq: Two times a day (BID) | ORAL | Status: DC

## 2021-05-17 MED ORDER — CEFTRIAXONE SODIUM 2 G IJ SOLR
2.0000 g | Freq: Once | INTRAMUSCULAR | Status: AC
  Administered 2021-05-17: 2 g via INTRAVENOUS
  Filled 2021-05-17: qty 20

## 2021-05-17 MED ORDER — SODIUM CHLORIDE 0.9 % IV BOLUS (SEPSIS)
1000.0000 mL | Freq: Once | INTRAVENOUS | Status: DC
  Administered 2021-05-17: 1000 mL via INTRAVENOUS

## 2021-05-17 MED ORDER — PANTOPRAZOLE SODIUM 40 MG PO TBEC
40.0000 mg | DELAYED_RELEASE_TABLET | Freq: Every day | ORAL | Status: DC
  Administered 2021-05-18 – 2021-05-21 (×4): 40 mg via ORAL
  Filled 2021-05-17 (×4): qty 1

## 2021-05-17 MED ORDER — LEVOTHYROXINE SODIUM 75 MCG PO TABS
175.0000 ug | ORAL_TABLET | Freq: Every day | ORAL | Status: DC
  Administered 2021-05-18: 175 ug via ORAL
  Filled 2021-05-17: qty 1

## 2021-05-17 MED ORDER — IPRATROPIUM-ALBUTEROL 0.5-2.5 (3) MG/3ML IN SOLN
3.0000 mL | RESPIRATORY_TRACT | Status: DC
  Administered 2021-05-17: 3 mL via RESPIRATORY_TRACT
  Filled 2021-05-17: qty 3

## 2021-05-17 MED ORDER — SODIUM CHLORIDE 0.9 % IV SOLN
2.0000 g | INTRAVENOUS | Status: DC
  Administered 2021-05-18 – 2021-05-20 (×3): 2 g via INTRAVENOUS
  Filled 2021-05-17 (×3): qty 20

## 2021-05-17 MED ORDER — ALBUTEROL SULFATE HFA 108 (90 BASE) MCG/ACT IN AERS
2.0000 | INHALATION_SPRAY | Freq: Four times a day (QID) | RESPIRATORY_TRACT | Status: DC | PRN
  Administered 2021-05-18 – 2021-05-20 (×5): 2 via RESPIRATORY_TRACT
  Filled 2021-05-17: qty 6.7

## 2021-05-17 MED ORDER — SODIUM CHLORIDE 0.9 % IV BOLUS (SEPSIS): 1000.0000 mL | Freq: Once | INTRAVENOUS | Status: DC

## 2021-05-17 MED ORDER — ACETAMINOPHEN 325 MG PO TABS: 650.0000 mg | ORAL_TABLET | Freq: Four times a day (QID) | ORAL | Status: DC | PRN

## 2021-05-17 MED ORDER — HYDROCORTISONE SOD SUC (PF) 100 MG IJ SOLR
100.0000 mg | Freq: Two times a day (BID) | INTRAMUSCULAR | Status: DC
  Administered 2021-05-17 – 2021-05-18 (×2): 100 mg via INTRAVENOUS
  Filled 2021-05-17 (×2): qty 2

## 2021-05-17 MED ORDER — ACETAMINOPHEN 650 MG RE SUPP: 650.0000 mg | Freq: Four times a day (QID) | RECTAL | Status: DC | PRN

## 2021-05-17 MED ORDER — ALBUTEROL SULFATE (2.5 MG/3ML) 0.083% IN NEBU
INHALATION_SOLUTION | RESPIRATORY_TRACT | Status: AC
  Administered 2021-05-17: 2.5 mg
  Filled 2021-05-17: qty 3

## 2021-05-17 MED ORDER — OXYCODONE HCL 5 MG PO TABS: 5.0000 mg | ORAL_TABLET | ORAL | Status: DC | PRN

## 2021-05-17 NOTE — Assessment & Plan Note (Addendum)
Patient declines Paxlovid at this time as she has heard about 2 many side effects on TV No current oxygen requirement, no current wheezing Overall respiratory status currently stable on RA Continue to monitor

## 2021-05-17 NOTE — Assessment & Plan Note (Addendum)
Stable on metoprolol Restart diltiazem as blood pressure allows

## 2021-05-17 NOTE — Assessment & Plan Note (Addendum)
Seen by PT with recommendations for SNF Patient agreeable to placement

## 2021-05-17 NOTE — ED Triage Notes (Signed)
Patient is from home brought in by son. Patient reports not being able to stand when getting up from her chair. Patient vomited twice while at home. Patient normally walks at home independently using a cane. Patient denies pain, shortness of breath. Patient takes warfarin for Afib. Patient also denies chest pain.

## 2021-05-17 NOTE — H&P (Signed)
History and Physical    Patient: Megan [name] XNA:[id number] DOB: Jul 27, 1945 DOA: 05/17/2021 DOS: the patient was seen and examined on 05/17/2021 PCP: Sharion Balloon, FNP  Patient coming from: Home  Chief Complaint:  Chief Complaint  Patient presents with   Weakness    HPI: Megan [name] is a 76 y.o. female with medical history significant of with history of acute diastolic heart failure, atrial fibrillation, lipidemia, hypertension, OSA but does not wear CPAP, hypothyroidism, previous insufficiency, and more presents to the ED with a chief complaint of so weak that she could not walk.  Patient reports that she normally ambulates at home with a cane, but has not been able to do so over the last 2-3 days.  Patient reports that since she has felt weak and been afraid that she would fall she has not gotten up at all.  ED provider reported that she was saturated in urine upon arrival.  Patient does live with her son at home.  She reports that he still been able to get food to her.  Her appetite has been slightly decreased, for example on the day of presentation all she had was half of a bacon egg biscuit from [employer].  Patient denies any symptoms of fever at home.  She does have a productive cough with clear/yellow sputum.  The sputum causes her to be nauseous.  Patient denies chest pain, abdominal pain.  Patient denies shortness of breath or wheezing.  In January patient was started on Keflex for UTI.  At the end of January she was prescribed prednisone, which she reports is for bronchitis.  The prednisone caused her glucose to go up to 200, so patient stopped taking it.  She reports when she became so weak the past couple of days she tried prednisone to see if would make her stronger, but it did not.  Patient reports that she has diet-controlled diabetes.  Patient reports that she has had dysuria but no hematuria.  We discussed Paxlovid and patient decided that she is not going to start this  medication because she has heard too many side effects about it on TV  Patient does not smoke, does not drink alcohol, does not use illicit drugs.  Patient has had 1 vaccine for COVID.  Patient is DNR.  Review of Systems: As mentioned in the history of present illness. All other systems reviewed and are negative. Past Medical History:  Diagnosis Date   Acute diastolic heart failure (HCC)    Anxiety state, unspecified    Atrial fibrillation (HCC)    Congestive heart failure, unspecified    Degenerative joint disease    GERD (gastroesophageal reflux disease)    Hyperlipidemia    Hypertension    Lymphedema    Obesity    OSA (obstructive sleep apnea) 06/09/2015   Persistent atrial fibrillation (HCC)     post-termination pauses with afib s/p PPM   Presence of permanent cardiac pacemaker    Sinoatrial node dysfunction ([place])    s/p PPM   Sleep apnea    compliant with CPAP   Unspecified hypothyroidism    Unspecified venous (peripheral) insufficiency    Past Surgical History:  Procedure Laterality Date   CATARACT EXTRACTION W/PHACO Left 09/09/2014   Procedure: CATARACT EXTRACTION PHACO AND INTRAOCULAR LENS PLACEMENT ([place]);  Surgeon: Tonny Branch, MD;  Location: [facility];  Service: Ophthalmology;  Laterality: Left;  CDE: 6.99   CATARACT EXTRACTION W/PHACO Right 10/07/2014   Procedure: CATARACT EXTRACTION PHACO AND INTRAOCULAR  LENS PLACEMENT RIGHT EYE;  Surgeon: Tonny Branch, MD;  Location: [facility];  Service: Ophthalmology;  Laterality: Right;  CDE:5.16   PACEMAKER INSERTION     SJM by JA   Social History:  reports that she quit smoking about 43 years ago. Her smoking use included cigarettes. She has a 5.00 pack-year smoking history. She has never used smokeless tobacco. She reports that she does not drink alcohol and does not use drugs.  No Known Allergies  Family History  Problem Relation Age of Onset   Parkinson's disease Other    CVA Other    Parkinson's disease Father     Prior to  Admission medications   Medication Sig Start Date End Date Taking? Authorizing Provider  albuterol (VENTOLIN HFA) 108 (90 Base) MCG/ACT inhaler INHALE 2 PUFFS BY MOUTH EVERY 6 HOURS AS NEEDED FOR WHEEZING OR SHORTNESS OF BREATH Patient taking differently: Inhale 2 puffs into the lungs every 6 (six) hours as needed for shortness of breath. 01/01/21  Yes Hawks, Alyse Low A, FNP  atorvastatin (LIPITOR) 80 MG tablet Take 1 tablet by mouth once daily 03/20/21  Yes Hawks, [place] A, FNP  clotrimazole-betamethasone (LOTRISONE) cream APPLY  CREAM TOPICALLY TO AFFECTED AREA TWICE DAILY 03/06/21  Yes Hawks, Christy A, FNP  diltiazem (CARDIZEM CD) 240 MG 24 hr capsule Take 1 capsule (240 mg total) by mouth daily. 01/26/21  Yes Hawks, Christy A, FNP  esomeprazole (NEXIUM) 40 MG capsule Take 1 capsule (40 mg total) by mouth daily. 12/18/20  Yes Hawks, Theador Hawthorne, FNP  levothyroxine (SYNTHROID) 175 MCG tablet Take 1 tablet (175 mcg total) by mouth daily before breakfast. 04/03/21  Yes Hawks, Christy A, FNP  metoprolol tartrate (LOPRESSOR) 100 MG tablet Take 1/2 (one-half) tablet by mouth twice daily 02/12/20  Yes Hawks, Christy A, FNP  mometasone (NASONEX) 50 MCG/ACT nasal spray Place 2 sprays into the nose daily. 12/18/20  Yes Hawks, Christy A, FNP  warfarin (COUMADIN) 3 MG tablet TAKE 1 TABLET BY MOUTH ONCE DAILY EXCEPT TAKE 1/2 (ONE-HALF) TABLET ONCE DAILY ON MONDAYS OR AS DIRECTED 03/09/21  Yes Allred, Jeneen Rinks, MD  blood glucose meter kit and supplies Dispense based on patient and insurance preference. Use up to four times daily as directed. (FOR ICD-10 E10.9, E11.9). 06/09/20   Sharion Balloon, FNP  cephALEXin (KEFLEX) 500 MG capsule Take 1 capsule (500 mg total) by mouth 2 (two) times daily. Patient not taking: Reported on 05/17/2021 04/02/21   Evelina Dun A, FNP  metroNIDAZOLE (METROGEL) 1 % gel Apply topically daily. Patient not taking: Reported on 05/17/2021 12/11/18   Chevis Pretty, FNP  predniSONE  (DELTASONE) 20 MG tablet Take 40 mg by mouth every morning. Patient not taking: Reported on 05/17/2021 04/19/21   [provider]  Vitamin D, Ergocalciferol, (DRISDOL) 1.25 MG (50000 UNIT) CAPS capsule Take 1 capsule by mouth once a week 01/01/21   Sharion Balloon, FNP    Physical Exam: Vitals:   05/17/21 2044 05/17/21 2100 05/17/21 2115 05/17/21 2226  BP:  (!) 150/70  133/86  Pulse:  98 94 88  Resp:  (!) 26 (!) 28 (!) 26  Temp:  98.4 F (36.9 C)  98.4 F (36.9 C)  TempSrc:      SpO2: 97% 95% 93% 93%  Weight:    128.7 kg  Height:    5' (1.524 m)   1.  General: Patient lying supine in bed,  no acute distress   2. Psychiatric: Alert and oriented x 3, mood  and behavior normal for situation, pleasant and cooperative with exam   3. Neurologic: Speech and language are normal, face is symmetric, moves all 4 extremities voluntarily, but does have significant weakness in the bilateral lower extremities, at baseline without acute deficits on limited exam   4. HEENMT:  Head is atraumatic, normocephalic, pupils reactive to light, neck is supple, trachea is midline, mucous membranes are moist   5. Respiratory : Lungs are clear to auscultation bilaterally without wheezing, rhonchi, rales, no cyanosis, no increase in work of breathing or accessory muscle use   6. Cardiovascular : Heart rate normal, rhythm is regular, no murmurs, rubs or gallops, lymphedema in the bilateral lower extremities, peripheral pulses palpated   7. Gastrointestinal:  Abdomen is soft, nondistended, nontender to palpation bowel sounds active, no masses or organomegaly palpated   8. Skin:  Skin is warm, dry and intact without rashes, acute lesions, or ulcers on limited exam   9.Musculoskeletal:  No acute deformities or trauma, no asymmetry in tone, lymphedema in the bilateral lower extremities, peripheral pulses palpated, no tenderness to palpation in the extremities   Data Reviewed: In the ED Temp  101.8, heart rate 88-104, respiratory 20-35, blood pressure 66/57-158/73 Leukocytosis 19.6, hemoglobin stable at 16.7 Chemistry panel is mostly unremarkable with a creatinine 1.08 and a glucose of 219 Albumin 3.1 Lactic acid initially 4.2 and then 2.6 COVID-positive UA is indicative of UTI Blood culture pending Urine culture pending Chest x-ray without evidence of acute disease 3 L bolus in ED Continue 150 MLS per hour LR Rocephin 2 g 1 dose given in ED Chart review from January medication prescriptions also  Assessment and Plan: * Sepsis ([place])- (present on admission) Patient has a temperature of 101.8, heart rate 104, respiratory rate 35, blood pressure as low as 66/57, white blood cell count 19.6, lactic acid 4.2 Sepsis order set utilized UA is indicative of UTI Urine culture pending Blood culture pending Chest x-ray shows no evidence of acute disease 3 L bolus in ED Continue LR at 150 MLS per hour Rocephin 2 g started in ED, continue 2 g daily Hold diltiazem, metoprolol in setting of hypotension Continue to trend lactic acid with morning labs Continue to monitor  COVID-19 virus infection Patient declines Paxlovid at this time as she has heard about 2 many side effects on TV No current oxygen requirement, no current wheezing Continue breathing treatments as patient remains slightly tachypneic Continue to monitor  Mixed hyperlipidemia- (present on admission) Continue statin medication  Acquired hypothyroidism- (present on admission) Continue Synthroid Given generalized weakness check TSH  Gastroesophageal reflux disease without esophagitis- (present on admission) Continue Protonix  Generalized weakness Most likely secondary to sepsis With history of hypothyroidism, will check TSH PT eval and treat Continue to monitor  ATRIAL FIBRILLATION- (present on admission) Holding diltiazem and metoprolol in the setting of hypotension Continue warfarin  HYPERTENSION,  BENIGN- (present on admission) Holding diltiazem and metoprolol in setting of low blood pressure Recorded blood pressure as low as 66/57 in the ED       Advance Care Planning:  DNR  Consults: PT eval and treat  Family Communication: No family at bedside  Severity of Illness: The appropriate patient status for this patient is INPATIENT. Inpatient status is judged to be reasonable and necessary in order to provide the required intensity of service to ensure the patient's safety. The patient's presenting symptoms, physical exam findings, and initial radiographic and laboratory data in the context of their chronic comorbidities is felt  to place them at high risk for further clinical deterioration. Furthermore, it is not anticipated that the patient will be medically stable for discharge from the hospital within 2 midnights of admission.   * I certify that at the point of admission it is my clinical judgment that the patient will require inpatient hospital care spanning beyond 2 midnights from the point of admission due to high intensity of service, high risk for further deterioration and high frequency of surveillance required.*  Author: Rolla Plate, DO 05/17/2021 11:53 PM  For on call review [email or web address].

## 2021-05-17 NOTE — Assessment & Plan Note (Signed)
Continue Protonix °

## 2021-05-17 NOTE — Sepsis Progress Note (Signed)
Elink following code sepsis °

## 2021-05-17 NOTE — ED Notes (Signed)
Resp in room  

## 2021-05-17 NOTE — Assessment & Plan Note (Addendum)
TSH appears to be suppressed at 0.049 Reports that her Synthroid dose was recently increased from 150 to 170mcg Would decrease dose back to 165mcg and repeat TSH in 4 weeks

## 2021-05-17 NOTE — Assessment & Plan Note (Signed)
-   Continue statin medication  

## 2021-05-17 NOTE — ED Provider Notes (Signed)
[facility] EMERGENCY DEPARTMENT Provider Note   CSN: [id number] Arrival date & time: 05/17/21  1513     History  Chief Complaint  Patient presents with   Weakness    Megan [name] is a 76 y.o. female.  HPI  Patient with medical history including hypertension, GERD, atrial fibrillation on warfarin, CHF, presents with complaints of not feeling well.  Patient states start about 1 week ago, she states that she just feels tired and fatigued, states that she has a slight cough, slightly productive, but denies any nasal congestion, headaches, fevers, chills, shortness of breath or wheezing.  She denies any stomach pain or decreased appetite but does endorse some nausea vomiting and loose stools has been going on for last 2 days, denies melena or hematochezia, denies coffee-ground emesis or hematemesis.  She has no urinary symptoms, denies dysuria hematuria flank tenderness.  She denies any recent falls, denies headaches, change in vision, paresthesia weakness upper or lower extremities.  States she been compliant with her medications.  She is not immunocompromise, is not vaccine against COVID or influenza.    Son was at bedside able to validate the story.  States that patient is generally amatory with a walker but has been unable to get out of bed due to her severe weakness.  Home Medications Prior to Admission medications   Medication Sig Start Date End Date Taking? Authorizing Provider  albuterol (VENTOLIN HFA) 108 (90 Base) MCG/ACT inhaler INHALE 2 PUFFS BY MOUTH EVERY 6 HOURS AS NEEDED FOR WHEEZING OR SHORTNESS OF BREATH Patient taking differently: Inhale 2 puffs into the lungs every 6 (six) hours as needed for shortness of breath. 01/01/21   Sharion Balloon, FNP  atorvastatin (LIPITOR) 80 MG tablet Take 1 tablet by mouth once daily 03/20/21   Sharion Balloon, FNP  blood glucose meter kit and supplies Dispense based on patient and insurance preference. Use up to four times daily as  directed. (FOR ICD-10 E10.9, E11.9). 06/09/20   Sharion Balloon, FNP  cephALEXin (KEFLEX) 500 MG capsule Take 1 capsule (500 mg total) by mouth 2 (two) times daily. 04/02/21   Sharion Balloon, FNP  clotrimazole-betamethasone (LOTRISONE) cream APPLY  CREAM TOPICALLY TO AFFECTED AREA TWICE DAILY 03/06/21   Evelina Dun A, FNP  diltiazem (CARDIZEM CD) 240 MG 24 hr capsule Take 1 capsule (240 mg total) by mouth daily. 01/26/21   Sharion Balloon, FNP  esomeprazole (NEXIUM) 40 MG capsule Take 1 capsule (40 mg total) by mouth daily. 12/18/20   Sharion Balloon, FNP  levothyroxine (SYNTHROID) 175 MCG tablet Take 1 tablet (175 mcg total) by mouth daily before breakfast. 04/03/21   Sharion Balloon, FNP  metoprolol tartrate (LOPRESSOR) 100 MG tablet Take 1/2 (one-half) tablet by mouth twice daily 02/12/20   Evelina Dun A, FNP  metroNIDAZOLE (METROGEL) 1 % gel Apply topically daily. 12/11/18   Hassell Done, Mary-Margaret, FNP  mometasone (NASONEX) 50 MCG/ACT nasal spray Place 2 sprays into the nose daily. 12/18/20   Sharion Balloon, FNP  predniSONE (DELTASONE) 20 MG tablet Take 40 mg by mouth every morning. 04/19/21   [provider]  Vitamin D, Ergocalciferol, (DRISDOL) 1.25 MG (50000 UNIT) CAPS capsule Take 1 capsule by mouth once a week 01/01/21   Evelina Dun A, FNP  warfarin (COUMADIN) 3 MG tablet TAKE 1 TABLET BY MOUTH ONCE DAILY EXCEPT TAKE 1/2 (ONE-HALF) TABLET ONCE DAILY ON MONDAYS OR AS DIRECTED 03/09/21   Thompson Grayer, MD  Allergies    Patient has no known allergies.    Review of Systems   Review of Systems  Constitutional:  Positive for activity change and fatigue. Negative for chills and fever.  HENT:  Negative for congestion.   Respiratory:  Positive for cough. Negative for shortness of breath.   Cardiovascular:  Negative for chest pain.  Gastrointestinal:  Positive for diarrhea, nausea and vomiting. Negative for abdominal pain.  Musculoskeletal:  Negative for myalgias.   Neurological:  Negative for headaches.   Physical Exam Updated Vital Signs BP 133/67 (BP Location: Right Arm)    Pulse 90    Temp (!) 101.8 F (38.8 C) (Rectal) Comment (Src): provider at bedside   Resp (!) 31    Ht 5' (1.524 m)    Wt 127 kg    SpO2 95%    BMI 54.68 kg/m  Physical Exam Vitals and nursing note reviewed.  Constitutional:      General: She is not in acute distress.    Appearance: She is not ill-appearing.  HENT:     Head: Normocephalic and atraumatic.     Comments: No deformity of the head present, no raccoon eyes or battle sign noted.    Nose: No congestion.     Mouth/Throat:     Mouth: Mucous membranes are moist.     Pharynx: Oropharynx is clear. No oropharyngeal exudate or posterior oropharyngeal erythema.  Eyes:     Extraocular Movements: Extraocular movements intact.     Conjunctiva/sclera: Conjunctivae normal.     Pupils: Pupils are equal, round, and reactive to light.  Cardiovascular:     Rate and Rhythm: Tachycardia present. Rhythm irregular.     Pulses: Normal pulses.     Heart sounds: No murmur heard.   No friction rub. No gallop.  Pulmonary:     Effort: No respiratory distress.     Breath sounds: No wheezing, rhonchi or rales.  Abdominal:     Palpations: Abdomen is soft.     Tenderness: There is no abdominal tenderness. There is no right CVA tenderness or left CVA tenderness.  Musculoskeletal:     Right lower leg: No edema.     Left lower leg: No edema.     Comments: Moving all 4 extremities, spine was palpated was nontender to palpation.  Skin:    General: Skin is warm and dry.  Neurological:     Mental Status: She is alert.     GCS: GCS eye subscore is 4. GCS verbal subscore is 5. GCS motor subscore is 6.     Cranial Nerves: Cranial nerves 2-12 are intact. No cranial nerve deficit.     Sensory: Sensation is intact.     Motor: No weakness.     Coordination: Romberg sign negative.     Comments: Alert and oriented x4.  Cranial nerves II through  XII grossly intact, no difficulty with word finding, able to follow two-step commands, no unilateral weakness present.  Psychiatric:        Mood and Affect: Mood normal.    ED Results / Procedures / Treatments   Labs (all labs ordered are listed, but only abnormal results are displayed) Labs Reviewed  RESP PANEL BY RT-PCR (FLU A&B, COVID) ARPGX2 - Abnormal; Notable for the following components:      Result Value   SARS Coronavirus 2 by RT PCR POSITIVE (*)    All other components within normal limits  BASIC METABOLIC PANEL - Abnormal; Notable for the following components:  Sodium 134 (*)    CO2 21 (*)    Glucose, Bld 219 (*)    Creatinine, Ser 1.08 (*)    GFR, Estimated 53 (*)    All other components within normal limits  CBC - Abnormal; Notable for the following components:   WBC 19.6 (*)    RBC 5.36 (*)    Hemoglobin 16.7 (*)    HCT 53.5 (*)    All other components within normal limits  URINALYSIS, ROUTINE W REFLEX MICROSCOPIC - Abnormal; Notable for the following components:   APPearance CLOUDY (*)    Hgb urine dipstick SMALL (*)    Ketones, ur 5 (*)    Protein, ur 30 (*)    Leukocytes,Ua MODERATE (*)    WBC, UA >50 (*)    Bacteria, UA RARE (*)    All other components within normal limits  LACTIC ACID, PLASMA - Abnormal; Notable for the following components:   Lactic Acid, Venous 4.2 (*)    All other components within normal limits  HEPATIC FUNCTION PANEL - Abnormal; Notable for the following components:   Albumin 3.1 (*)    All other components within normal limits  CULTURE, BLOOD (ROUTINE X 2)  CULTURE, BLOOD (ROUTINE X 2)  URINE CULTURE  LACTIC ACID, PLASMA  PROTIME-INR  CBG MONITORING, ED    EKG EKG Interpretation  Date/Time:  Sunday May 17 2021 15:49:23 EST Ventricular Rate:  111 PR Interval:    QRS Duration: 64 QT Interval:  324 QTC Calculation: 440 R Axis:   111 Text Interpretation: Accelerated Junctional rhythm with Premature supraventricular  complexes and with frequent Premature ventricular complexes Right axis deviation Cannot rule out Anterior infarct , age undetermined Marked ST abnormality, possible inferolateral subendocardial injury Abnormal ECG When compared with ECG of 20-Jan-2021 06:13, PREVIOUS ECG IS PRESENT Confirmed by Milton Ferguson [phone]) on 05/17/2021 7:10:59 PM  Radiology DG Chest Port 1 View  Result Date: 05/17/2021 CLINICAL DATA:  Questionable sepsis - evaluate for abnormality EXAM: PORTABLE CHEST 1 VIEW COMPARISON:  January 20, 2021. FINDINGS: No consolidation. No visible pleural effusions or pneumothorax. Cardiomediastinal silhouette is enlarged, likely accentuated by AP technique and low lung volumes. Left dual lead subclavian approach cardiac rhythm maintenance device. Overall, similar appearance of the chest in comparison to the priors. IMPRESSION: No evidence of acute cardiopulmonary disease. Electronically Signed   By: Margaretha Sheffield M.D.   On: 05/17/2021 18:02    Procedures .Critical Care Performed by: Marcello Fennel, PA-C Authorized by: Marcello Fennel, PA-C   Critical care provider statement:    Critical care time (minutes):  30   Critical care was necessary to treat or prevent imminent or life-threatening deterioration of the following conditions:  Sepsis   Critical care was time spent personally by me on the following activities:  Evaluation of patient's response to treatment, examination of patient, ordering and review of laboratory studies, ordering and review of radiographic studies, ordering and performing treatments and interventions, pulse oximetry, re-evaluation of patient's condition, review of old charts and discussions with consultants   I assumed direction of critical care for this patient from another provider in my specialty: no     Care discussed with: admitting provider      Medications Ordered in ED Medications  lactated ringers infusion ( Intravenous New Bag/Given 05/17/21  1820)  sodium chloride 0.9 % bolus 1,000 mL (1,000 mLs Intravenous New Bag/Given 05/17/21 1739)  cefTRIAXone (ROCEPHIN) 2 g in sodium chloride 0.9 % 100 mL IVPB (0  g Intravenous Stopped 05/17/21 1821)  sodium chloride 0.9 % bolus 1,000 mL (1,000 mLs Intravenous Bolus 05/17/21 1852)  sodium chloride 0.9 % bolus 1,000 mL (1,000 mLs Intravenous Bolus 05/17/21 1852)    ED Course/ Medical Decision Making/ A&P                           Medical Decision Making Amount and/or Complexity of Data Reviewed Labs: ordered. Radiology: ordered.  Risk Prescription drug management. Decision regarding hospitalization.   This patient presents to the ED for concern of weakness, this involves an extensive number of treatment options, and is a complaint that carries with it a high risk of complications and morbidity.  The differential diagnosis includes sepsis, metabolic abnormality, CVA,    Additional history obtained:  Additional history obtained from son who is at bedside External records from outside source obtained and reviewed including please see HPI for further detail   Co morbidities that complicate the patient evaluation  Obesity, A-fib,  Social Determinants of Health:  Age    Lab Tests:  I Ordered, and personally interpreted labs.  The pertinent results include: CBC shows leukocytosis of 19.6, BMP shows sodium 134, CO2 of 21, glucose 219, creatinine 1.08, GFR 53, hepatic panel unremarkable, lactic 4.2, UA shows moderate leukocytes, red blood cells, white blood cells, rare bacteria.  Respiratory panel positive for COVID.   Imaging Studies ordered:  I ordered imaging studies including chest x-ray I independently visualized and interpreted imaging which showed unremarkable I agree with the radiologist interpretation   Cardiac Monitoring:  The patient was maintained on a cardiac monitor.  I personally viewed and interpreted the cardiac monitored which showed an underlying rhythm of:  A-fib without signs of ischemia   Medicines ordered and prescription drug management:  I ordered medication including fluids for dehydration I have reviewed the patients home medicines and have made adjustments as needed  Critical Interventions:  Code sepsis activated she meets sepsis criteria febrile, leukocytosis, likely source UTI started on antibiotics.  As well as fluids.   Reevaluation:  Patient meets sepsis criteria, she is started on fluids, as well as antibiotics she was reevaluated she is found resting comfortably, has no complaints, will continue to monitor.  Note that patient is positive for COVID, endorses symptoms started about 1 week ago, she has no new oxygen requirements, will defer on steroid treatment at this time.  Patient was reassessed, resting comfortably, vital signs have improved, will recommend admission for likely urosepsis she is agreement this plan.  Consultations Obtained:  I requested consultation with hospitalist Dr. Nori Riis,  and discussed lab and imaging findings as well as pertinent plan - they recommend: Has accepted the patient  Rule out I have low suspicion for CVA or intracranial head bleed patient denies any head trauma, denies any headaches, change in vision, paresthesias or weakness upper or lower extremities, no focal deficit present my exam.  I have low suspicion for pneumonia as lung sounds are clear bilaterally, chest x-ray is unremarkable.  Low suspicion for PE she denies pleuritic chest pain, shortness of breath, she has no new oxygen requirements, presentation atypical etiology.  I have low suspicion for ACS she denies any chest pain EKG without signs of ischemia.  I have low suspicion for intra-abdominal abnormality as abdomen soft nontender to palpation, no significant derailments noted in her chemistries.    Dispostion and problem list  After consideration of the diagnostic results and the patients response  to treatment, I feel that the  patent would benefit from admission.  Sepsis-likely urine, cultures have been sent, she started on ceftriaxone, patient will need further evaluation. COVID-patient has new oxygen requirements, will defer on antiviral treatment this time.             Final Clinical Impression(s) / ED Diagnoses Final diagnoses:  Sepsis without acute organ dysfunction, due to unspecified organism [facility])  COVID  Acute cystitis without hematuria    Rx / DC Orders ED Discharge Orders     None         Aron Baba 05/17/21 1914    Milton Ferguson, MD 05/19/21 1743

## 2021-05-17 NOTE — Assessment & Plan Note (Addendum)
Restarted on metoprolol since and diltiazem blood pressure is now elevated Continue warfarin

## 2021-05-17 NOTE — ED Notes (Signed)
ED TO INPATIENT HANDOFF REPORT  ED Nurse Name and Phone #: [phone]  S Name/Age/Gender Megan [name] 76 y.o. female Room/Bed: APA18/APA18  Code Status   Code Status: Prior  Home/SNF/Other Home Patient oriented to: self, place, time, and situation Is this baseline? Yes   Triage Complete: Triage complete  Chief Complaint Sepsis [facility]) [A41.9]  Triage Note Patient is from home brought in by son. Patient reports not being able to stand when getting up from her chair. Patient vomited twice while at home. Patient normally walks at home independently using a cane. Patient denies pain, shortness of breath. Patient takes warfarin for Afib. Patient also denies chest pain.    Allergies No Known Allergies  Level of Care/Admitting Diagnosis ED Disposition     ED Disposition  Admit   Condition  --   [place]: [facility] [[id number]]  Level of Care: Telemetry [5]  Covid Evaluation: Confirmed COVID Positive  Diagnosis: Sepsis [facility]) [[id number]]  Admitting Physician: Rolla Plate [[id number]]  Attending Physician: Rolla Plate [[id number]]  Estimated length of stay: past midnight tomorrow  Certification:: I certify this patient will need inpatient services for at least 2 midnights          B Medical/Surgery History Past Medical History:  Diagnosis Date   Acute diastolic heart failure ([place])    Anxiety state, unspecified    Atrial fibrillation (HCC)    Congestive heart failure, unspecified    Degenerative joint disease    GERD (gastroesophageal reflux disease)    Hyperlipidemia    Hypertension    Lymphedema    Obesity    OSA (obstructive sleep apnea) 06/09/2015   Persistent atrial fibrillation ([place])     post-termination pauses with afib s/p PPM   Presence of permanent cardiac pacemaker    Sinoatrial node dysfunction ([place])    s/p PPM   Sleep apnea    compliant with CPAP   Unspecified hypothyroidism    Unspecified venous (peripheral)  insufficiency    Past Surgical History:  Procedure Laterality Date   CATARACT EXTRACTION W/PHACO Left 09/09/2014   Procedure: CATARACT EXTRACTION PHACO AND INTRAOCULAR LENS PLACEMENT ([place]);  Surgeon: Tonny Branch, MD;  Location: [facility];  Service: Ophthalmology;  Laterality: Left;  CDE: 6.99   CATARACT EXTRACTION W/PHACO Right 10/07/2014   Procedure: CATARACT EXTRACTION PHACO AND INTRAOCULAR LENS PLACEMENT RIGHT EYE;  Surgeon: Tonny Branch, MD;  Location: [facility];  Service: Ophthalmology;  Laterality: Right;  CDE:5.16   PACEMAKER INSERTION     SJM by JA     A IV Location/Drains/Wounds Patient Lines/Drains/Airways Status     Active Line/Drains/Airways     Name Placement date Placement time Site Days   Peripheral IV 05/17/21 20 G Anterior;Left 05/17/21  1735  --  less than 1   Peripheral IV 05/17/21 20 G Anterior;Proximal;Right;Upper Arm 05/17/21  1715  Arm  less than 1   External Urinary Catheter 05/17/21  1837  --  less than 1            Intake/Output Last 24 hours  Intake/Output Summary (Last 24 hours) at 05/17/2021 1955 Last data filed at 05/17/2021 1821 Gross per 24 hour  Intake 800 ml  Output --  Net 800 ml    Labs/Imaging Results for orders placed or performed during the hospital encounter of 05/17/21 (from the past 48 hour(s))  Basic metabolic panel     Status: Abnormal   Collection Time: 05/17/21  4:26 PM  Result Value Ref  Range   Sodium 134 (L) 135 - 145 mmol/L   Potassium 4.8 3.5 - 5.1 mmol/L   Chloride 98 98 - 111 mmol/L   CO2 21 (L) 22 - 32 mmol/L   Glucose, Bld 219 (H) 70 - 99 mg/dL    Comment: Glucose reference range applies only to samples taken after fasting for at least 8 hours.   BUN 14 8 - 23 mg/dL   Creatinine, Ser 1.08 (H) 0.44 - 1.00 mg/dL   Calcium 9.1 8.9 - 10.3 mg/dL   GFR, Estimated 53 (L) >60 mL/min    Comment: (NOTE) Calculated using the CKD-EPI Creatinine Equation (2021)    Anion gap 15 5 - 15    Comment: Performed at [facility],  [address] [place], [place] [address]  CBC     Status: Abnormal   Collection Time: 05/17/21  4:26 PM  Result Value Ref Range   WBC 19.6 (H) 4.0 - 10.5 K/uL   RBC 5.36 (H) 3.87 - 5.11 MIL/uL   Hemoglobin 16.7 (H) 12.0 - 15.0 g/dL   HCT 53.5 (H) 36.0 - 46.0 %   MCV 99.8 80.0 - 100.0 fL   MCH 31.2 26.0 - 34.0 pg   MCHC 31.2 30.0 - 36.0 g/dL   RDW 14.2 11.5 - 15.5 %   Platelets 252 150 - 400 K/uL   nRBC 0.0 0.0 - 0.2 %    Comment: Performed at [facility], [address] [place], [place] [address]  Blood Culture (routine x 2)     Status: None (Preliminary result)   Collection Time: 05/17/21  5:30 PM   Specimen: BLOOD RIGHT FOREARM  Result Value Ref Range   Specimen Description      BLOOD RIGHT FOREARM BOTTLES DRAWN AEROBIC AND ANAEROBIC   Special Requests      Blood Culture results may not be optimal due to an excessive volume of blood received in culture bottles Performed at [facility], [address] [place], [place] [address]    Culture PENDING    Report Status PENDING   Blood Culture (routine x 2)     Status: None (Preliminary result)   Collection Time: 05/17/21  5:30 PM   Specimen: BLOOD LEFT ARM  Result Value Ref Range   Specimen Description BLOOD LEFT ARM BOTTLES DRAWN AEROBIC AND ANAEROBIC    Special Requests      Blood Culture adequate volume Performed at [facility], [address] [place], [place] [address]    Culture PENDING    Report Status PENDING   Lactic acid, plasma     Status: Abnormal   Collection Time: 05/17/21  5:33 PM  Result Value Ref Range   Lactic Acid, Venous 4.2 (HH) 0.5 - 1.9 mmol/L    Comment: CRITICAL RESULT CALLED TO, READ BACK BY AND VERIFIED WITH: TO VIPER, A. ST 1806 ON 05/17/21 BY BROWNING, D. Performed at [facility], [address], [place], [place] [address]   Hepatic function panel     Status: Abnormal   Collection Time: 05/17/21  5:33 PM  Result Value Ref Range   Total Protein 6.9 6.5 - 8.1 g/dL   Albumin 3.1 (L) 3.5 - 5.0 g/dL    AST 27 15 - 41 U/L   ALT 25 0 - 44 U/L   Alkaline Phosphatase 109 38 - 126 U/L   Total Bilirubin 1.0 0.3 - 1.2 mg/dL   Bilirubin, Direct 0.2 0.0 - 0.2 mg/dL   Indirect Bilirubin 0.8 0.3 - 0.9  mg/dL    Comment: Performed at [facility], [address], [place], [place] [address]  Resp Panel by RT-PCR (Flu A&B, Covid) Nasopharyngeal Swab     Status: Abnormal   Collection Time: 05/17/21  5:35 PM   Specimen: Nasopharyngeal Swab; Nasopharyngeal(NP) swabs in vial transport medium  Result Value Ref Range   SARS Coronavirus 2 by RT PCR POSITIVE (A) NEGATIVE    Comment: (NOTE) SARS-CoV-2 target nucleic acids are DETECTED.  The SARS-CoV-2 RNA is generally detectable in upper respiratory specimens during the acute phase of infection. Positive results are indicative of the presence of the identified virus, but do not rule out bacterial infection or co-infection with other pathogens not detected by the test. Clinical correlation with patient history and other diagnostic information is necessary to determine patient infection status. The expected result is Negative.  Fact Sheet for Patients: EntrepreneurPulse.com.au  Fact Sheet for Healthcare Providers: IncredibleEmployment.be  This test is not yet approved or cleared by the [place] FDA and  has been authorized for detection and/or diagnosis of SARS-CoV-2 by FDA under an Emergency Use Authorization (EUA).  This EUA will remain in effect (meaning this test can be used) for the duration of  the COVID-19 declaration under Section 564(b)(1) of the A ct, 21 U.S.C. section 360bbb-3(b)(1), unless the authorization is terminated or revoked sooner.     Influenza A by PCR NEGATIVE NEGATIVE   Influenza B by PCR NEGATIVE NEGATIVE    Comment: (NOTE) The Xpert Xpress SARS-CoV-2/FLU/RSV plus assay is intended as an aid in the diagnosis of influenza from Nasopharyngeal swab specimens and should not be used as a  sole basis for treatment. Nasal washings and aspirates are unacceptable for Xpert Xpress SARS-CoV-2/FLU/RSV testing.  Fact Sheet for Patients: EntrepreneurPulse.com.au  Fact Sheet for Healthcare Providers: IncredibleEmployment.be  This test is not yet approved or cleared by the [place] FDA and has been authorized for detection and/or diagnosis of SARS-CoV-2 by FDA under an Emergency Use Authorization (EUA). This EUA will remain in effect (meaning this test can be used) for the duration of the COVID-19 declaration under Section 564(b)(1) of the Act, 21 U.S.C. section 360bbb-3(b)(1), unless the authorization is terminated or revoked.  Performed at [facility], [address] [place], [place] [address]   Urinalysis, Routine w reflex microscopic Urine, In & Out Cath     Status: Abnormal   Collection Time: 05/17/21  6:22 PM  Result Value Ref Range   Color, Urine YELLOW YELLOW   APPearance CLOUDY (A) CLEAR   Specific Gravity, Urine 1.025 1.005 - 1.030   pH 5.0 5.0 - 8.0   Glucose, UA NEGATIVE NEGATIVE mg/dL   Hgb urine dipstick SMALL (A) NEGATIVE   Bilirubin Urine NEGATIVE NEGATIVE   Ketones, ur 5 (A) NEGATIVE mg/dL   Protein, ur 30 (A) NEGATIVE mg/dL   Nitrite NEGATIVE NEGATIVE   Leukocytes,Ua MODERATE (A) NEGATIVE   RBC / HPF 11-20 0 - 5 RBC/hpf   WBC, UA >50 (H) 0 - 5 WBC/hpf   Bacteria, UA RARE (A) NONE SEEN   Squamous Epithelial / LPF 0-5 0 - 5   Mucus PRESENT    Hyaline Casts, UA PRESENT     Comment: Performed at [facility], [address], [place], [place] [address]  Protime-INR     Status: Abnormal   Collection Time: 05/17/21  7:25 PM  Result Value Ref Range   Prothrombin Time 34.4 (H) 11.4 - 15.2 seconds   INR 3.4 (H) 0.8 - 1.2  Comment: (NOTE) INR goal varies based on device and disease states. Performed at [facility], [address] [place], [place], [place] [address]    DG Chest Port 1 View  Result Date:  05/17/2021 CLINICAL DATA:  Questionable sepsis - evaluate for abnormality EXAM: PORTABLE CHEST 1 VIEW COMPARISON:  January 20, 2021. FINDINGS: No consolidation. No visible pleural effusions or pneumothorax. Cardiomediastinal silhouette is enlarged, likely accentuated by AP technique and low lung volumes. Left dual lead subclavian approach cardiac rhythm maintenance device. Overall, similar appearance of the chest in comparison to the priors. IMPRESSION: No evidence of acute cardiopulmonary disease. Electronically Signed   By: Margaretha Sheffield M.D.   On: 05/17/2021 18:02    Pending Labs Unresulted Labs (From admission, onward)     Start     Ordered   05/18/21 0500  TSH  Tomorrow morning,   R        05/17/21 1931   05/17/21 1733  Lactic acid, plasma  (Septic presentation on arrival (screening labs, nursing and treatment orders for obvious sepsis))  Now then every 2 hours,   STAT      05/17/21 1734   05/17/21 1733  Urine Culture  (Septic presentation on arrival (screening labs, nursing and treatment orders for obvious sepsis))  ONCE - STAT,   STAT       Question:  Indication  Answer:  Sepsis   05/17/21 1734   Signed and Held  Protime-INR  Tomorrow morning,   R        Signed and Held   Signed and Held  Cortisol-am, blood  Tomorrow morning,   R        Signed and Held   Signed and Held  Procalcitonin  Tomorrow morning,   R        Signed and Held   Signed and Held  Comprehensive metabolic panel  Tomorrow morning,   R        Signed and Held   Signed and Held  Magnesium  Tomorrow morning,   R        Signed and Held   Signed and Held  CBC WITH DIFFERENTIAL  Tomorrow morning,   R        Signed and Held   Signed and Held  Expectorated Sputum Assessment w Gram Stain, Rflx to [employer]  Once,   R        Signed and Held            Vitals/Pain Today's Vitals   05/17/21 1830 05/17/21 1843 05/17/21 1934 05/17/21 1948  BP:  133/67 133/72   Pulse:  90 86 91  Resp: (!) 34 (!) 31 (!) 23 (!) 32   Temp:   98.3 F (36.8 C)   TempSrc:   Oral   SpO2:  95% 93% 96%  Weight:      Height:        Isolation Precautions No active isolations  Medications Medications  lactated ringers infusion ( Intravenous New Bag/Given 05/17/21 1820)  ipratropium-albuterol (DUONEB) 0.5-2.5 (3) MG/3ML nebulizer solution 3 mL (has no administration in time range)  methylPREDNISolone sodium succinate (SOLU-MEDROL) 125 mg/2 mL injection 125 mg (has no administration in time range)  sodium chloride 0.9 % bolus 1,000 mL (1,000 mLs Intravenous New Bag/Given 05/17/21 1739)  cefTRIAXone (ROCEPHIN) 2 g in sodium chloride 0.9 % 100 mL IVPB (0 g Intravenous Stopped 05/17/21 1821)  sodium chloride 0.9 % bolus 1,000 mL (1,000 mLs Intravenous Bolus 05/17/21 1852)  sodium chloride  0.9 % bolus 1,000 mL (1,000 mLs Intravenous Bolus 05/17/21 1852)    Mobility manual wheelchair High fall risk   Focused Assessments Pulmonary Assessment Handoff:  Lung sounds:   O2 Device: Room Air  COVID+    R Recommendations: See Admitting Provider Note  Report given to:   Additional Notes:

## 2021-05-17 NOTE — Assessment & Plan Note (Addendum)
On admission: Patient has a temperature of 101.8, heart rate 104, respiratory rate 35, blood pressure as low as 66/57, white blood cell count 19.6, lactic acid 4.2>>improved with IV fluids Sepsis order set utilized UA is indicative of UTI Urine culture--EColi Blood culture neg Chest x-ray shows no evidence of acute disease 3 L bolus in ED Rocephin 2 g started in ED, continue 2 g daily pending final culture data Lactic acid trending down Sepsis physiology resolved D/c with cefdinir x 4 more days

## 2021-05-17 NOTE — ED Notes (Signed)
Patient is not wheezing , Lungs clear by x-ray. Does not use oxygen or  CPAP at home. Sleeps sitting up most likely in chair. Exteremly obese. Probably does need CPAP - not sure if will wear. Has had machine and sleep study in Past. Albuterol inhaler q6 PRN 2 puffs.

## 2021-05-18 DIAGNOSIS — N3000 Acute cystitis without hematuria: Secondary | ICD-10-CM

## 2021-05-18 DIAGNOSIS — E039 Hypothyroidism, unspecified: Secondary | ICD-10-CM | POA: Diagnosis not present

## 2021-05-18 DIAGNOSIS — A419 Sepsis, unspecified organism: Secondary | ICD-10-CM | POA: Diagnosis not present

## 2021-05-18 DIAGNOSIS — R739 Hyperglycemia, unspecified: Secondary | ICD-10-CM

## 2021-05-18 DIAGNOSIS — I4891 Unspecified atrial fibrillation: Secondary | ICD-10-CM | POA: Diagnosis not present

## 2021-05-18 LAB — COMPREHENSIVE METABOLIC PANEL
ALT: 21 U/L (ref 0–44)
AST: 18 U/L (ref 15–41)
Albumin: 2.6 g/dL — ABNORMAL LOW (ref 3.5–5.0)
Alkaline Phosphatase: 88 U/L (ref 38–126)
Anion gap: 10 (ref 5–15)
BUN: 12 mg/dL (ref 8–23)
CO2: 21 mmol/L — ABNORMAL LOW (ref 22–32)
Calcium: 8 mg/dL — ABNORMAL LOW (ref 8.9–10.3)
Chloride: 106 mmol/L (ref 98–111)
Creatinine, Ser: 0.75 mg/dL (ref 0.44–1.00)
GFR, Estimated: >60 mL/min (ref >60)
Glucose, Bld: 198 mg/dL — ABNORMAL HIGH (ref 70–99)
Potassium: 4 mmol/L (ref 3.5–5.1)
Sodium: 137 mmol/L (ref 135–145)
Total Bilirubin: 0.9 mg/dL (ref 0.3–1.2)
Total Protein: 5.8 g/dL — ABNORMAL LOW (ref 6.5–8.1)

## 2021-05-18 LAB — CBC WITH DIFFERENTIAL/PLATELET
Abs Immature Granulocytes: 0.06 10*3/uL (ref 0.00–0.07)
Basophils Absolute: 0 10*3/uL (ref 0.0–0.1)
Basophils Relative: 0 %
Eosinophils Absolute: 0 10*3/uL (ref 0.0–0.5)
Eosinophils Relative: 0 %
HCT: 39.6 % (ref 36.0–46.0)
Hemoglobin: 12.7 g/dL (ref 12.0–15.0)
Immature Granulocytes: 1 %
Lymphocytes Relative: 4 %
Lymphs Abs: 0.4 10*3/uL — ABNORMAL LOW (ref 0.7–4.0)
MCH: 31.3 pg (ref 26.0–34.0)
MCHC: 32.1 g/dL (ref 30.0–36.0)
MCV: 97.5 fL (ref 80.0–100.0)
Monocytes Absolute: 0.3 10*3/uL (ref 0.1–1.0)
Monocytes Relative: 3 %
Neutro Abs: 8.8 10*3/uL — ABNORMAL HIGH (ref 1.7–7.7)
Neutrophils Relative %: 92 %
Platelets: 199 10*3/uL (ref 150–400)
RBC: 4.06 MIL/uL (ref 3.87–5.11)
RDW: 14.1 % (ref 11.5–15.5)
WBC: 9.5 10*3/uL (ref 4.0–10.5)
nRBC: 0 % (ref 0.0–0.2)

## 2021-05-18 LAB — PROCALCITONIN: Procalcitonin: 0.35 ng/mL

## 2021-05-18 LAB — PROTIME-INR
INR: 3.4 — ABNORMAL HIGH (ref 0.8–1.2)
Prothrombin Time: 34.2 seconds — ABNORMAL HIGH (ref 11.4–15.2)

## 2021-05-18 LAB — GLUCOSE, CAPILLARY
Glucose-Capillary: 164 mg/dL — ABNORMAL HIGH (ref 70–99)
Glucose-Capillary: 168 mg/dL — ABNORMAL HIGH (ref 70–99)
Glucose-Capillary: 230 mg/dL — ABNORMAL HIGH (ref 70–99)
Glucose-Capillary: 136 mg/dL — ABNORMAL HIGH (ref 70–99)

## 2021-05-18 LAB — CORTISOL-AM, BLOOD: Cortisol - AM: 41.7 ug/dL — ABNORMAL HIGH (ref 6.7–22.6)

## 2021-05-18 LAB — MAGNESIUM: Magnesium: 1.8 mg/dL (ref 1.7–2.4)

## 2021-05-18 LAB — TSH: TSH: 0.049 u[IU]/mL — ABNORMAL LOW (ref 0.350–4.500)

## 2021-05-18 MED ORDER — ATORVASTATIN CALCIUM 40 MG PO TABS
80.0000 mg | ORAL_TABLET | Freq: Every day | ORAL | Status: DC
  Administered 2021-05-18 – 2021-05-21 (×4): 80 mg via ORAL
  Filled 2021-05-18 (×4): qty 2

## 2021-05-18 MED ORDER — NYSTATIN 100000 UNIT/GM EX CREA
TOPICAL_CREAM | Freq: Two times a day (BID) | CUTANEOUS | Status: DC
  Filled 2021-05-18: qty 30

## 2021-05-18 MED ORDER — METOPROLOL TARTRATE 50 MG PO TABS
50.0000 mg | ORAL_TABLET | Freq: Two times a day (BID) | ORAL | Status: DC
  Administered 2021-05-18 – 2021-05-21 (×6): 50 mg via ORAL
  Filled 2021-05-18 (×6): qty 1

## 2021-05-18 MED ORDER — BISACODYL 10 MG RE SUPP: 10.0000 mg | Freq: Once | RECTAL | Status: DC

## 2021-05-18 MED ORDER — LEVOTHYROXINE SODIUM 75 MCG PO TABS
150.0000 ug | ORAL_TABLET | Freq: Every day | ORAL | Status: DC
  Administered 2021-05-19 – 2021-05-21 (×3): 150 ug via ORAL
  Filled 2021-05-18 (×3): qty 2

## 2021-05-18 MED ORDER — INSULIN ASPART 100 UNIT/ML IJ SOLN
0.0000 [IU] | Freq: Three times a day (TID) | INTRAMUSCULAR | Status: DC
  Administered 2021-05-18 (×2): 3 [IU] via SUBCUTANEOUS
  Administered 2021-05-18: 5 [IU] via SUBCUTANEOUS
  Administered 2021-05-19 (×2): 2 [IU] via SUBCUTANEOUS

## 2021-05-18 NOTE — Assessment & Plan Note (Addendum)
Last A1c from 04/02/2021 was 5.8 Blood sugars have been running during this admission, but she was receiving on IV hydrocortisone Steroids discontinued on 2/21 and blood sugars are now improving

## 2021-05-18 NOTE — Progress Notes (Signed)
ANTICOAGULATION CONSULT NOTE - Initial Consult  Pharmacy Consult for Coumadin Indication: atrial fibrillation  No Known Allergies  Patient Measurements: Height: 5' (152.4 cm) Weight: 128.7 kg (283 lb 11.7 oz) IBW/kg (Calculated) : 45.5  Vital Signs: Temp: 97.9 F (36.6 C) (02/20 0500) Temp Source: Oral (02/20 0500) BP: 137/76 (02/20 0500) Pulse Rate: 93 (02/20 0500)  Labs: Recent Labs    05/17/21 1626 05/17/21 1925 05/18/21 0502  HGB 16.7*  --  12.7  HCT 53.5*  --  39.6  PLT 252  --  199  LABPROT  --  34.4* 34.2*  INR  --  3.4* 3.4*  CREATININE 1.08*  --  0.75    Estimated Creatinine Clearance: 73.3 mL/min (by C-G formula based on SCr of 0.75 mg/dL).   Medical History: Past Medical History:  Diagnosis Date   Acute diastolic heart failure (HCC)    Anxiety state, unspecified    Atrial fibrillation (HCC)    Congestive heart failure, unspecified    Degenerative joint disease    GERD (gastroesophageal reflux disease)    Hyperlipidemia    Hypertension    Lymphedema    Obesity    OSA (obstructive sleep apnea) 06/09/2015   Persistent atrial fibrillation (HCC)     post-termination pauses with afib s/p PPM   Presence of permanent cardiac pacemaker    Sinoatrial node dysfunction (HCC)    s/p PPM   Sleep apnea    compliant with CPAP   Unspecified hypothyroidism    Unspecified venous (peripheral) insufficiency     Medications:  Medications Prior to Admission  Medication Sig Dispense Refill Last Dose   albuterol (VENTOLIN HFA) 108 (90 Base) MCG/ACT inhaler INHALE 2 PUFFS BY MOUTH EVERY 6 HOURS AS NEEDED FOR WHEEZING OR SHORTNESS OF BREATH (Patient taking differently: Inhale 2 puffs into the lungs every 6 (six) hours as needed for shortness of breath.) 18 g 0 unknown   atorvastatin (LIPITOR) 80 MG tablet Take 1 tablet by mouth once daily 90 tablet 1 unknown   clotrimazole-betamethasone (LOTRISONE) cream APPLY  CREAM TOPICALLY TO AFFECTED AREA TWICE DAILY 45 g 0  unknown   diltiazem (CARDIZEM CD) 240 MG 24 hr capsule Take 1 capsule (240 mg total) by mouth daily. 90 capsule 1    esomeprazole (NEXIUM) 40 MG capsule Take 1 capsule (40 mg total) by mouth daily. 90 capsule 1    levothyroxine (SYNTHROID) 175 MCG tablet Take 1 tablet (175 mcg total) by mouth daily before breakfast. 90 tablet 1    metoprolol tartrate (LOPRESSOR) 100 MG tablet Take 1/2 (one-half) tablet by mouth twice daily 90 tablet 2    mometasone (NASONEX) 50 MCG/ACT nasal spray Place 2 sprays into the nose daily. 1 each 12 unknown   warfarin (COUMADIN) 3 MG tablet TAKE 1 TABLET BY MOUTH ONCE DAILY EXCEPT TAKE 1/2 (ONE-HALF) TABLET ONCE DAILY ON MONDAYS OR AS DIRECTED 35 tablet 5 unknown   blood glucose meter kit and supplies Dispense based on patient and insurance preference. Use up to four times daily as directed. (FOR ICD-10 E10.9, E11.9). 1 each 0    cephALEXin (KEFLEX) 500 MG capsule Take 1 capsule (500 mg total) by mouth 2 (two) times daily. (Patient not taking: Reported on 05/17/2021) 14 capsule 0 Completed Course   metroNIDAZOLE (METROGEL) 1 % gel Apply topically daily. (Patient not taking: Reported on 05/17/2021) 45 g 0 Not Taking   predniSONE (DELTASONE) 20 MG tablet Take 40 mg by mouth every morning. (Patient not taking: Reported on 05/17/2021)  Completed Course   Vitamin D, Ergocalciferol, (DRISDOL) 1.25 MG (50000 UNIT) CAPS capsule Take 1 capsule by mouth once a week 12 capsule 1     Assessment: Patient is a 76 yo female with history of acute diastolic heart failure, atrial fibrillation, lipidemia, hypertension, OSA but does not wear CPAP, hypothyroidism, previous insufficiency, and more presents to the ED with a chief complaint of weakness. In addition, her COVID is positive, she has had sxs x 1 week. Patient does not want Paxlovid. She is on chronic coumadin therapy for afib.  INR 3.4, supratherapeutic  Goal of Therapy:  INR 2.2-2.5 per anticoag clinic Monitor platelets by  anticoagulation protocol: Yes   Plan:  No coumadin today Daily PT-INR Monitor for S/S of bleeding  Isac Sarna, BS Vena [place], BCPS Clinical Pharmacist Pager [phone] 05/18/2021,9:10 AM

## 2021-05-18 NOTE — Progress Notes (Addendum)
Progress Note   Patient: Megan [name] DOB: April 25, 1945 DOA: 05/17/2021     1 DOS: the patient was seen and examined on 05/18/2021   Brief hospital course: 76 year old female, with history of morbid obesity, lymphedema, hypertension, atrial fibrillation on anticoagulation, here to the hospital with urinary tract infection and sepsis.  She was also COVID-positive.  Started on antibiotics and IV fluids.  Physical therapy to evaluate.  She lives at home with her son, but her son is often not at home since he is working.  She reports that she can normally ambulate with a walker.  Assessment and Plan: * Sepsis ([place])- (present on admission) On admission: Patient has a temperature of 101.8, heart rate 104, respiratory rate 35, blood pressure as low as 66/57, white blood cell count 19.6, lactic acid 4.2 Sepsis order set utilized UA is indicative of UTI Urine culture pending Blood culture pending Chest x-ray shows no evidence of acute disease 3 L bolus in ED Continue LR at 150 MLS per hour Rocephin 2 g started in ED, continue 2 g daily Lactic acid trending down Hemodynamics stabilizing Continue to monitor  Hyperglycemia Last A1c from 04/02/2021 was 5.8 Blood sugars have been running during this admission, but she is currently on IV hydrocortisone We will discontinue steroids and monitor blood sugars  COVID-19 virus infection- (present on admission) Patient declines Paxlovid at this time as she has heard about 2 many side effects on TV No current oxygen requirement, no current wheezing Overall respiratory status currently stable Continue to monitor  Mixed hyperlipidemia- (present on admission) Continue statin medication  Acquired hypothyroidism- (present on admission) TSH appears to be suppressed at 0.049 Reports that her Synthroid dose was recently increased from 150 to 124mcg Would decrease dose back to 168mcg and repeat TSH in 4 weeks  Gastroesophageal reflux disease  without esophagitis- (present on admission) Continue Protonix  Lymphedema Chronic, currently at baseline  Generalized weakness Most likely secondary to sepsis PT eval and treat Continue to monitor  Morbid obesity due to excess calories ([place])- (present on admission) BMI 55.4 Will need lifestyle changes  Tachycardia-bradycardia syndrome ([place])- (present on admission) Status post permanent pacemaker  ATRIAL FIBRILLATION- (present on admission) Restarted on metoprolol since blood pressure is now elevated Continue warfarin  HYPERTENSION, BENIGN- (present on admission) Restarted on metoprolol since blood pressure is now elevated        Subjective: She is feeling better.  Denies any shortness of breath.  Still feels weak.  She has not had a bowel movement.  Physical Exam: Vitals:   05/18/21 0220 05/18/21 0500 05/18/21 1258 05/18/21 1532  BP: (!) 153/86 137/76 (!) 150/77   Pulse: 85 93 100   Resp: (!) 22 20 18    Temp: 98.2 F (36.8 C) 97.9 F (36.6 C) 98.5 F (36.9 C)   TempSrc:  Oral Oral   SpO2: 94% 95% 95% 95%  Weight:      Height:       General exam: Alert, awake, oriented x 3 Respiratory system: Clear to auscultation. Respiratory effort normal. Cardiovascular system:RRR. No murmurs, rubs, gallops. Gastrointestinal system: Abdomen is nondistended, soft and nontender. No organomegaly or masses felt. Normal bowel sounds heard. Central nervous system: Alert and oriented. No focal neurological deficits. Extremities: Bilateral lower extremity lymphedema Skin: No rashes, lesions or ulcers Psychiatry: Judgement and insight appear normal. Mood & affect appropriate.    Data Reviewed:  Reviewed chemistry panel, CBC, culture data  Family Communication: Discussed with patient  Disposition: Status  is: Inpatient Remains inpatient appropriate because: Continued management of UTI/sepsis          Planned Discharge Destination:  To be determined     Time  spent: 35 minutes  Author: Kathie Dike, MD 05/18/2021 9:35 PM  For on call review [email or web address].

## 2021-05-18 NOTE — Assessment & Plan Note (Signed)
Status post permanent pacemaker

## 2021-05-18 NOTE — Assessment & Plan Note (Signed)
BMI 55.4 Will need lifestyle changes

## 2021-05-18 NOTE — Hospital Course (Signed)
76 year old female, with history of morbid obesity, lymphedema, hypertension, atrial fibrillation on anticoagulation, here to the hospital with urinary tract infection and sepsis.  She was also COVID-positive.  Started on antibiotics and IV fluids.  Physical therapy to evaluate.  She lives at home with her son, but her son is often not at home since he is working.  She reports that she can normally ambulate with a walker.

## 2021-05-18 NOTE — Progress Notes (Addendum)
Patient to start Paxlovid renal dosing. Medication not on hand at the moment per [facility]. Notified MD and informed to hold medication at this time. MD came up to bedside to talk with patient. This nurse was informed, that patient now refusing Paxlovid at this moment. No new orders at this time.

## 2021-05-18 NOTE — Assessment & Plan Note (Signed)
Chronic, currently at baseline 

## 2021-05-19 DIAGNOSIS — I4891 Unspecified atrial fibrillation: Secondary | ICD-10-CM | POA: Diagnosis not present

## 2021-05-19 DIAGNOSIS — N3000 Acute cystitis without hematuria: Secondary | ICD-10-CM | POA: Diagnosis not present

## 2021-05-19 DIAGNOSIS — E039 Hypothyroidism, unspecified: Secondary | ICD-10-CM | POA: Diagnosis not present

## 2021-05-19 DIAGNOSIS — A419 Sepsis, unspecified organism: Secondary | ICD-10-CM | POA: Diagnosis not present

## 2021-05-19 LAB — PROTIME-INR
INR: 3.7 — ABNORMAL HIGH (ref 0.8–1.2)
Prothrombin Time: 36.3 seconds — ABNORMAL HIGH (ref 11.4–15.2)

## 2021-05-19 LAB — CBC
HCT: 39.3 % (ref 36.0–46.0)
Hemoglobin: 12.2 g/dL (ref 12.0–15.0)
MCH: 30.6 pg (ref 26.0–34.0)
MCHC: 31 g/dL (ref 30.0–36.0)
MCV: 98.5 fL (ref 80.0–100.0)
Platelets: 209 10*3/uL (ref 150–400)
RBC: 3.99 MIL/uL (ref 3.87–5.11)
RDW: 13.7 % (ref 11.5–15.5)
WBC: 11.4 10*3/uL — ABNORMAL HIGH (ref 4.0–10.5)
nRBC: 0 % (ref 0.0–0.2)

## 2021-05-19 LAB — GLUCOSE, CAPILLARY
Glucose-Capillary: 105 mg/dL — ABNORMAL HIGH (ref 70–99)
Glucose-Capillary: 134 mg/dL — ABNORMAL HIGH (ref 70–99)
Glucose-Capillary: 135 mg/dL — ABNORMAL HIGH (ref 70–99)
Glucose-Capillary: 106 mg/dL — ABNORMAL HIGH (ref 70–99)

## 2021-05-19 MED ORDER — WARFARIN - PHARMACIST DOSING INPATIENT: Freq: Every day | Status: DC

## 2021-05-19 NOTE — Progress Notes (Signed)
Progress Note   Patient: Megan [name] EXN:[id number] DOB: Dec 21, 1945 DOA: 05/17/2021     2 DOS: the patient was seen and examined on 05/19/2021   Brief hospital course: 76 year old female, with history of morbid obesity, lymphedema, hypertension, atrial fibrillation on anticoagulation, here to the hospital with urinary tract infection and sepsis.  She was also COVID-positive.  Started on antibiotics and IV fluids.  Physical therapy to evaluate.  She lives at home with her son, but her son is often not at home since he is working.  She reports that she can normally ambulate with a walker.  Assessment and Plan: * Sepsis ([place])- (present on admission) On admission: Patient has a temperature of 101.8, heart rate 104, respiratory rate 35, blood pressure as low as 66/57, white blood cell count 19.6, lactic acid 4.2>>improved with IV fluids Sepsis order set utilized UA is indicative of UTI Urine culture pending Blood culture pending Chest x-ray shows no evidence of acute disease 3 L bolus in ED Rocephin 2 g started in ED, continue 2 g daily Lactic acid trending down Hemodynamics stabilizing Continue to monitor  Hyperglycemia Last A1c from 04/02/2021 was 5.8 Blood sugars have been running during this admission, but she was receiving on IV hydrocortisone Steroids discontinued on 2/21 and blood sugars are now improving  COVID-19 virus infection- (present on admission) Patient declines Paxlovid at this time as she has heard about 2 many side effects on TV No current oxygen requirement, no current wheezing Overall respiratory status currently stable Continue to monitor  Mixed hyperlipidemia- (present on admission) Continue statin medication  Acquired hypothyroidism- (present on admission) TSH appears to be suppressed at 0.049 Reports that her Synthroid dose was recently increased from 150 to 114mcg Would decrease dose back to 123mcg and repeat TSH in 4 weeks  Gastroesophageal reflux  disease without esophagitis- (present on admission) Continue Protonix  Lymphedema Chronic, currently at baseline  Generalized weakness Seen by PT with recommendations for SNF Patient agreeable to placement  Morbid obesity due to excess calories ([place])- (present on admission) BMI 55.4 Will need lifestyle changes  Tachycardia-bradycardia syndrome ([place])- (present on admission) Status post permanent pacemaker  ATRIAL FIBRILLATION- (present on admission) Restarted on metoprolol since blood pressure is now elevated Continue warfarin  HYPERTENSION, BENIGN- (present on admission) Stable on metoprolol Restart diltiazem as blood pressure allows        Subjective: she is feeling better. No longer having hallucinations. Has some wheezing today  Physical Exam: Vitals:   05/19/21 0854 05/19/21 1231 05/19/21 1949 05/19/21 2143  BP:  128/76  136/83  Pulse:  86  96  Resp:  18  17  Temp:    97.7 F (36.5 C)  TempSrc:    Oral  SpO2: 96% 95% 96% 97%  Weight:      Height:       General exam: Alert, awake, oriented x 3 Respiratory system: mild wheeze bilaterally, Respiratory effort normal. Cardiovascular system:RRR. No murmurs, rubs, gallops. Gastrointestinal system: Abdomen is nondistended, soft and nontender. No organomegaly or masses felt. Normal bowel sounds heard. Central nervous system: Alert and oriented. No focal neurological deficits. Extremities: lymphedema bilaterally Skin: No rashes, lesions or ulcers Psychiatry: Judgement and insight appear normal. Mood & affect appropriate.    Data Reviewed:  Reviewed culture data, blood sugars  Family Communication: discussed with patient  Disposition: Status is: Inpatient Remains inpatient appropriate because: determining safe discharge disposition          Planned Discharge Destination: Skilled nursing facility  Time spent: 35 minutes  Author: Kathie Dike, MD 05/19/2021 10:42 PM  For on call review  [email or web address].

## 2021-05-19 NOTE — TOC Initial Note (Signed)
Transition of Care [facility]) - Initial/Assessment Note    Patient Details  Name: Megan [name] MRN: [id number] Date of Birth: May 30, 1945  [facility] CM/SW Contact:    Ihor Gully, LCSW Phone Number: 05/19/2021, 2:27 PM  Clinical Narrative:                 Patient from home with son. Admitted for UTI, sepsis, and COVID. Son works at night and sleeps during the day. Patient ambulates with a cane. Has a walker also. Discussed PT recommendation for SNF.  Patient does not think she wants to go to SNF. Discussed HHPT. Patient does not know if she wants HHPT because she does not want it to disrupt her son's sleep. Discussed OPPT. Patient is unsure as to whether she wants OPPT.  Discussed in detail patient's rehab needs. Patient continues to indicate that she does not want SNF or HHPT.  Expected Discharge Plan: Skilled Nursing Facility Barriers to Discharge: Continued Medical Work up   Patient Goals and CMS Choice Patient states their goals for this hospitalization and ongoing recovery are:: Patient is unsure as to whether she wants SNF or HHPT   Choice offered to / list presented to : Patient  Expected Discharge Plan and Services Expected Discharge Plan: [place]       Living arrangements for the past 2 months: Single Family Home                                      Prior Living Arrangements/Services Living arrangements for the past 2 months: Single Family Home Lives with:: Adult Children Patient language and need for interpreter reviewed:: Yes        Need for Family Participation in Patient Care: Yes (Comment) Care giver support system in place?: Yes (comment) Current home services: DME Criminal Activity/Legal Involvement Pertinent to Current Situation/Hospitalization: No - Comment as needed  Activities of Daily Living Home Assistive Devices/Equipment: Cane (specify quad or straight) ADL Screening (condition at time of  admission) Patient's cognitive ability adequate to safely complete daily activities?: Yes Is the patient deaf or have difficulty hearing?: No Does the patient have difficulty seeing, even when wearing glasses/contacts?: Yes Does the patient have difficulty concentrating, remembering, or making decisions?:  (up close (reading glasses)) Patient able to express need for assistance with ADLs?: Yes Does the patient have difficulty dressing or bathing?: Yes Independently performs ADLs?: No Communication: Independent Dressing (OT): Needs assistance Is this a change from baseline?: Pre-admission baseline Grooming: Independent Feeding: Independent Bathing: Needs assistance Is this a change from baseline?: Pre-admission baseline Toileting: Needs assistance Is this a change from baseline?: Pre-admission baseline In/Out Bed: Needs assistance Is this a change from baseline?: Pre-admission baseline Walks in Home: Independent with device (comment) Does the patient have difficulty walking or climbing stairs?: Yes Weakness of Legs: Both Weakness of Arms/Hands: None  Permission Sought/Granted                  Emotional Assessment     Affect (typically observed): Appropriate Orientation: : Oriented to Self, Oriented to Place, Oriented to  Time, Oriented to Situation Alcohol / Substance Use: Not Applicable Psych Involvement: No (comment)  Admission diagnosis:  Acute cystitis without hematuria [N30.00] Sepsis ([place]) [A41.9] Sepsis without acute organ dysfunction, due to unspecified organism ([place]) [A41.9] COVID [U07.1] Patient Active Problem List   Diagnosis Date Noted   Hyperglycemia 05/18/2021  Sepsis ([place]) 05/17/2021   COVID-19 virus infection 05/17/2021   Chronic congestive heart failure ([place]) 08/24/2019   Intertrigo 05/11/2017   OAB (overactive bladder) 02/22/2017   Lymphedema 01/26/2017   Gastroesophageal reflux disease without esophagitis 01/26/2017   Acquired hypothyroidism  01/26/2017   Mixed hyperlipidemia 01/26/2017   Generalized weakness 08/17/2015   Pyrexia    Sleeps in sitting position due to orthopnea 07/31/2015   Morbid obesity due to excess calories ([place]) 06/09/2015   PVD (peripheral vascular disease) with claudication ([place]) 06/09/2015   Long term (current) use of anticoagulants 06/19/2010   Tachycardia-bradycardia syndrome ([place]) 11/18/2008   PPM-[place] 11/18/2008   HYPERTENSION, BENIGN 02/21/2008   ATRIAL FIBRILLATION 02/21/2008   PCP:  Sharion Balloon, FNP Pharmacy:   [facility] [address] [place] [address] [place] [address] Phone: [phone] Fax: [phone]  [place], [place] [address] Phone: [phone] Fax: [phone]     Social Determinants of Health (SDOH) Interventions    Readmission Risk Interventions No flowsheet data found.

## 2021-05-19 NOTE — Evaluation (Signed)
Physical Therapy Evaluation Patient Details Name: Megan [name] MRN: [id number] DOB: 24-May-1945 Today's Date: 05/19/2021  History of Present Illness  Megan [name] is a 76 y.o. female with medical history significant of with history of acute diastolic heart failure, atrial fibrillation, lipidemia, hypertension, OSA but does not wear CPAP, hypothyroidism, previous insufficiency, and more presents to the ED with a chief complaint of so weak that she could not walk.  Patient reports that she normally ambulates at home with a cane, but has not been able to do so over the last 2-3 days.  Patient reports that since she has felt weak and been afraid that she would fall she has not gotten up at all.  ED provider reported that she was saturated in urine upon arrival.  Patient does live with her son at home.  She reports that he still been able to get food to her.  Her appetite has been slightly decreased, for example on the day of presentation all she had was half of a bacon egg biscuit from [employer].  Patient denies any symptoms of fever at home.  She does have a productive cough with clear/yellow sputum.  The sputum causes her to be nauseous.  Patient denies chest pain, abdominal pain.  Patient denies shortness of breath or wheezing.  In January patient was started on Keflex for UTI.  At the end of January she was prescribed prednisone, which she reports is for bronchitis.  The prednisone caused her glucose to go up to 200, so patient stopped taking it.  She reports when she became so weak the past couple of days she tried prednisone to see if would make her stronger, but it did not.  Patient reports that she has diet-controlled diabetes.  Patient reports that she has had dysuria but no hematuria.   Clinical Impression  Patient demonstrates fair/good return for sitting up at bedside with [facility] raised, able to transfer to/from commode in bathroom with Min assist mostly due to having difficulty sit to standing  from low commode, limited to ambulating to door and back to bedside due to c/o fatigue and tolerated sitting up in chair after therapy.  Patient will benefit from continued skilled physical therapy in hospital and recommended venue below to increase strength, balance, endurance for safe ADLs and gait.        Recommendations for follow up therapy are one component of a multi-disciplinary discharge planning process, led by the attending physician.  Recommendations may be updated based on patient status, additional functional criteria and insurance authorization.  Follow Up Recommendations Skilled nursing-short term rehab (<3 hours/day)    Assistance Recommended at Discharge Set up Supervision/Assistance  Patient can return home with the following  A little help with walking and/or transfers;A little help with bathing/dressing/bathroom;Help with stairs or ramp for entrance;Assistance with cooking/housework    Equipment Recommendations None recommended by PT  Recommendations for Other Services       Functional Status Assessment Patient has had a recent decline in their functional status and demonstrates the ability to make significant improvements in function in a reasonable and predictable amount of time.     Precautions / Restrictions Precautions Precautions: Fall Restrictions Weight Bearing Restrictions: No      Mobility  Bed Mobility Overal bed mobility: Modified Independent             General bed mobility comments: HOB elevated    Transfers  General transfer comment: as per OT notes    Ambulation/Gait Ambulation/Gait assistance: Min guard, Min assist Gait Distance (Feet): 25 Feet Assistive device: Rolling walker (2 wheels) Gait Pattern/deviations: Decreased step length - right, Decreased step length - left, Decreased stride length Gait velocity: decreased     General Gait Details: slow labored cadence without loss of balance, limited  mostly due to c/o fatigue  Stairs            Wheelchair Mobility    Modified Rankin (Stroke Patients Only)       Balance Overall balance assessment: Needs assistance Sitting-balance support: Feet supported, No upper extremity supported Sitting balance-Leahy Scale: Normal Sitting balance - Comments: seated EOB   Standing balance support: During functional activity, No upper extremity supported Standing balance-Leahy Scale: Poor Standing balance comment: fair using RW                             Pertinent Vitals/Pain Pain Assessment Pain Assessment: No/denies pain    Home Living Family/patient expects to be discharged to:: Private residence Living Arrangements: Children Available Help at Discharge: Available PRN/intermittently;Family Type of Home: House Home Access: Stairs to enter Entrance Stairs-Rails: Right Entrance Stairs-Number of Steps: 5   Home Layout: One level Home Equipment: BSC/3in1;Rolling Walker (2 wheels);Cane - single point;Wheelchair - manual      Prior Function Prior Level of Function : Needs assist       Physical Assist : Mobility (physical);ADLs (physical) Mobility (physical): Bed mobility;Transfers;Gait;Stairs   Mobility Comments: household and short distanced community ambulator using [facility] ADLs Comments: Pt reports independence with ADL's with family assist for IADL's.     Hand Dominance   Dominant Hand: Right    Extremity/Trunk Assessment   Upper Extremity Assessment Upper Extremity Assessment: Defer to OT evaluation    Lower Extremity Assessment Lower Extremity Assessment: Generalized weakness    Cervical / Trunk Assessment Cervical / Trunk Assessment: Kyphotic  Communication   Communication: No difficulties  Cognition Arousal/Alertness: Awake/alert Behavior During Therapy: WFL for tasks assessed/performed Overall Cognitive Status: Within Functional Limits for tasks assessed                                           General Comments      Exercises     Assessment/Plan    PT Assessment Patient needs continued PT services  PT Problem List Decreased strength;Decreased activity tolerance;Decreased balance;Decreased mobility       PT Treatment Interventions DME instruction;Gait training;Stair training;Functional mobility training;Therapeutic activities;Therapeutic exercise;Patient/family education;Balance training    PT Goals (Current goals can be found in the Care Plan section)  Acute Rehab PT Goals Patient Stated Goal: return home with family to assist PT Goal Formulation: With patient Time For Goal Achievement: 05/28/21 Potential to Achieve Goals: Good    Frequency Min 3X/week     Co-evaluation PT/OT/SLP Co-Evaluation/Treatment: Yes Reason for Co-Treatment: To address functional/ADL transfers PT goals addressed during session: Mobility/safety with mobility;Balance;Proper use of DME OT goals addressed during session: ADL's and self-care       AM-PAC PT "6 Clicks" Mobility  Outcome Measure Help needed turning from your back to your side while in a flat bed without using bedrails?: None Help needed moving from lying on your back to sitting on the side of a flat bed without using bedrails?: A Little Help needed  moving to and from a bed to a chair (including a wheelchair)?: A Little Help needed standing up from a chair using your arms (e.g., wheelchair or bedside chair)?: A Little Help needed to walk in hospital room?: A Little Help needed climbing 3-5 steps with a railing? : A Lot 6 Click Score: 18    End of Session   Activity Tolerance: Patient tolerated treatment well;Patient limited by fatigue Patient left: in chair;with call bell/phone within reach Nurse Communication: Mobility status PT Visit Diagnosis: Unsteadiness on feet (R26.81);Other abnormalities of gait and mobility (R26.89);Muscle weakness (generalized) (M62.81)    Time: 4315-4008 PT Time  Calculation (min) (ACUTE ONLY): 28 min   Charges:   PT Evaluation $PT Eval Moderate Complexity: 1 Mod PT Treatments $Therapeutic Activity: 23-37 mins        2:01 PM, 05/19/21 Lonell Grandchild, MPT Physical Therapist with [facility] [phone] office [phone] mobile phone

## 2021-05-19 NOTE — Progress Notes (Signed)
ANTICOAGULATION CONSULT NOTE -   Pharmacy Consult for Coumadin Indication: atrial fibrillation  No Known Allergies  Patient Measurements: Height: 5' (152.4 cm) Weight: 128.7 kg (283 lb 11.7 oz) IBW/kg (Calculated) : 45.5  Vital Signs: Temp: 98 F (36.7 C) (02/21 0434) Temp Source: Oral (02/21 0434) BP: 149/70 (02/21 0434) Pulse Rate: 93 (02/21 0434)  Labs: Recent Labs    05/17/21 1626 05/17/21 1925 05/18/21 0502 05/19/21 0448  HGB 16.7*  --  12.7 12.2  HCT 53.5*  --  39.6 39.3  PLT 252  --  199 209  LABPROT  --  34.4* 34.2* 36.3*  INR  --  3.4* 3.4* 3.7*  CREATININE 1.08*  --  0.75  --      Estimated Creatinine Clearance: 73.3 mL/min (by C-G formula based on SCr of 0.75 mg/dL).   Medical History: Past Medical History:  Diagnosis Date   Acute diastolic heart failure (HCC)    Anxiety state, unspecified    Atrial fibrillation (HCC)    Congestive heart failure, unspecified    Degenerative joint disease    GERD (gastroesophageal reflux disease)    Hyperlipidemia    Hypertension    Lymphedema    Obesity    OSA (obstructive sleep apnea) 06/09/2015   Persistent atrial fibrillation (HCC)     post-termination pauses with afib s/p PPM   Presence of permanent cardiac pacemaker    Sinoatrial node dysfunction (HCC)    s/p PPM   Sleep apnea    compliant with CPAP   Unspecified hypothyroidism    Unspecified venous (peripheral) insufficiency     Medications:  Medications Prior to Admission  Medication Sig Dispense Refill Last Dose   albuterol (VENTOLIN HFA) 108 (90 Base) MCG/ACT inhaler INHALE 2 PUFFS BY MOUTH EVERY 6 HOURS AS NEEDED FOR WHEEZING OR SHORTNESS OF BREATH (Patient taking differently: Inhale 2 puffs into the lungs every 6 (six) hours as needed for shortness of breath.) 18 g 0 unknown   atorvastatin (LIPITOR) 80 MG tablet Take 1 tablet by mouth once daily 90 tablet 1 Past Week   clotrimazole-betamethasone (LOTRISONE) cream APPLY  CREAM TOPICALLY TO  AFFECTED AREA TWICE DAILY (Patient taking differently: Apply 1 application topically 2 (two) times daily.) 45 g 0 Past Month   diltiazem (CARDIZEM CD) 240 MG 24 hr capsule Take 1 capsule (240 mg total) by mouth daily. 90 capsule 1 Past Week   esomeprazole (NEXIUM) 40 MG capsule Take 1 capsule (40 mg total) by mouth daily. 90 capsule 1 Past Week   levothyroxine (SYNTHROID) 175 MCG tablet Take 1 tablet (175 mcg total) by mouth daily before breakfast. 90 tablet 1 Past Week   metoprolol tartrate (LOPRESSOR) 100 MG tablet Take 1/2 (one-half) tablet by mouth twice daily (Patient taking differently: Take 50 mg by mouth 2 (two) times daily.) 90 tablet 2 05/16/2021 at am   mometasone (NASONEX) 50 MCG/ACT nasal spray Place 2 sprays into the nose daily. 1 each 12 unknown   Vitamin D, Ergocalciferol, (DRISDOL) 1.25 MG (50000 UNIT) CAPS capsule Take 1 capsule by mouth once a week (Patient taking differently: Take 50,000 Units by mouth every 7 (seven) days.) 12 capsule 1 Past Week   warfarin (COUMADIN) 3 MG tablet TAKE 1 TABLET BY MOUTH ONCE DAILY EXCEPT TAKE 1/2 (ONE-HALF) TABLET ONCE DAILY ON MONDAYS OR AS DIRECTED (Patient taking differently: Take 1.5-3 mg by mouth See admin instructions. 1 TABLET BY MOUTH ONCE DAILY EXCEPT TAKE 1/2 (ONE-HALF) TABLET ONCE DAILY ON MONDAYS OR AS DIRECTED)  35 tablet 5 05/16/2021 at am   blood glucose meter kit and supplies Dispense based on patient and insurance preference. Use up to four times daily as directed. (FOR ICD-10 E10.9, E11.9). 1 each 0    cephALEXin (KEFLEX) 500 MG capsule Take 1 capsule (500 mg total) by mouth 2 (two) times daily. (Patient not taking: Reported on 05/17/2021) 14 capsule 0 Completed Course   metroNIDAZOLE (METROGEL) 1 % gel Apply topically daily. (Patient not taking: Reported on 05/17/2021) 45 g 0 Completed Course    Assessment: Patient is a 76 yo female with history of acute diastolic heart failure, atrial fibrillation, lipidemia, hypertension, OSA but  does not wear CPAP, hypothyroidism, previous insufficiency, and more presents to the ED with a chief complaint of weakness. In addition, her COVID is positive, she has had sxs x 1 week. Patient does not want Paxlovid. She is on chronic coumadin therapy for afib.   INR 3.4> 3.7, supratherapeutic  Goal of Therapy:  INR 2.2-2.5 per anticoag clinic Monitor platelets by anticoagulation protocol: Yes   Plan:  No coumadin today Daily PT-INR Monitor for S/S of bleeding  Isac Sarna, BS [place]ena [place], BCPS Clinical Pharmacist Pager [phone] 05/19/2021,9:15 AM

## 2021-05-19 NOTE — Evaluation (Signed)
Occupational Therapy Evaluation Patient Details Name: Megan [name] MRN: [id number] DOB: January 17, 1946 Today's Date: 05/19/2021   History of Present Illness Megan [name] is a 76 y.o. female with medical history significant of with history of acute diastolic heart failure, atrial fibrillation, lipidemia, hypertension, OSA but does not wear CPAP, hypothyroidism, previous insufficiency, and more presents to the ED with a chief complaint of so weak that she could not walk.  Patient reports that she normally ambulates at home with a cane, but has not been able to do so over the last 2-3 days.  Patient reports that since she has felt weak and been afraid that she would fall she has not gotten up at all.  ED provider reported that she was saturated in urine upon arrival.  Patient does live with her son at home.  She reports that he still been able to get food to her.  Her appetite has been slightly decreased, for example on the day of presentation all she had was half of a bacon egg biscuit from [employer].  Patient denies any symptoms of fever at home.  She does have a productive cough with clear/yellow sputum.  The sputum causes her to be nauseous.  Patient denies chest pain, abdominal pain.  Patient denies shortness of breath or wheezing.  In January patient was started on Keflex for UTI.  At the end of January she was prescribed prednisone, which she reports is for bronchitis.  The prednisone caused her glucose to go up to 200, so patient stopped taking it.  She reports when she became so weak the past couple of days she tried prednisone to see if would make her stronger, but it did not.  Patient reports that she has diet-controlled diabetes.  Patient reports that she has had dysuria but no hematuria.   Clinical Impression   Pt agreeable to OT and PT co-evaluation. Pt generally weak in B UE but with WFL A/ROM. Pt unsteady in standing with cane needing single hand held assist. With RW pt was at more  supervision to min G assist. Pt reports that her son is not able to help 24/7. Pt appears mildly anxious about functional mobility without supervision level of assist. Pt was able to pull up her undergarments in standing and shows need for likely min A to min g assist for lower body dressing. Pt left in chair with call bell within reach. Pt will benefit from continued OT in the hospital and recommended venue below to increase strength, balance, and endurance for safe ADL's.      Recommendations for follow up therapy are one component of a multi-disciplinary discharge planning process, led by the attending physician.  Recommendations may be updated based on patient status, additional functional criteria and insurance authorization.   Follow Up Recommendations  Skilled nursing-short term rehab (<3 hours/day)    Assistance Recommended at Discharge Intermittent Supervision/Assistance  Patient can return home with the following A little help with walking and/or transfers;A little help with bathing/dressing/bathroom;Assistance with cooking/housework;Assist for transportation;Help with stairs or ramp for entrance    Functional Status Assessment  Patient has had a recent decline in their functional status and demonstrates the ability to make significant improvements in function in a reasonable and predictable amount of time.  Equipment Recommendations  None recommended by OT    Recommendations for Other Services       Precautions / Restrictions Precautions Precautions: Fall Restrictions Weight Bearing Restrictions: No      Mobility Bed Mobility  Overal bed mobility: Modified Independent             General bed mobility comments: HOB elevated    Transfers Overall transfer level: Needs assistance Equipment used: Rolling walker (2 wheels) Transfers: Sit to/from Stand, Bed to chair/wheelchair/BSC Sit to Stand: Supervision, Min guard     Step pivot transfers: Supervision, Min guard      General transfer comment: Pt unsteady with cane but improved transfers with use of RW.      Balance Overall balance assessment: Needs assistance Sitting-balance support: No upper extremity supported, Feet supported Sitting balance-Leahy Scale: Good Sitting balance - Comments: seated EOB   Standing balance support: Bilateral upper extremity supported, During functional activity, Reliant on assistive device for balance Standing balance-Leahy Scale: Fair Standing balance comment: fair with RW                           ADL either performed or assessed with clinical judgement   ADL Overall ADL's : Needs assistance/impaired     Grooming: Supervision/safety;Min guard;Standing           Upper Body Dressing : Modified independent;Sitting   Lower Body Dressing: Sit to/from stand;Minimal assistance Lower Body Dressing Details (indicate cue type and reason): Pt able to pull up undergamrents once placed on B LE. Toilet Transfer: Ambulation;Rolling walker (2 wheels);Supervision/safety Toilet Transfer Details (indicate cue type and reason): Pt able to transfer to toilet with cane needing Min A. With RW back form toilet pt required supervisoin.         Functional mobility during ADLs: Supervision/safety;Min guard;Rolling walker (2 wheels) General ADL Comments: Pt in generally weak but demosntrates ability to complete transfers with supervision as long as the RW is in use. More unsteady with cane.     Vision Baseline Vision/History: 1 Wears glasses (reading) Ability to See in Adequate Light: 1 Impaired Patient Visual Report: No change from baseline Vision Assessment?: No apparent visual deficits                Pertinent Vitals/Pain Pain Assessment Pain Assessment: No/denies pain     Hand Dominance Right   Extremity/Trunk Assessment Upper Extremity Assessment Upper Extremity Assessment: Generalized weakness   Lower Extremity Assessment Lower Extremity  Assessment: Defer to PT evaluation   Cervical / Trunk Assessment Cervical / Trunk Assessment: Kyphotic   Communication Communication Communication: No difficulties   Cognition Arousal/Alertness: Awake/alert Behavior During Therapy: WFL for tasks assessed/performed Overall Cognitive Status: Within Functional Limits for tasks assessed                                                        Home Living Family/patient expects to be discharged to:: Private residence Living Arrangements: Children Available Help at Discharge: Available PRN/intermittently;Family Type of Home: House Home Access: Stairs to enter [employer] of Steps: 5 Entrance Stairs-Rails: Right Home Layout: One level     Bathroom Shower/Tub: Sponge bathes at baseline   Bathroom Toilet: Handicapped height     Home Equipment: Public relations account executive (2 wheels);Cane - single point          Prior Functioning/Environment Prior Level of Function : Needs assist             Mobility Comments: Household and community ambulator with cane. ADLs Comments: Pt reports  independence with ADL's with family assist for IADL's.        OT Problem List: Obesity;Decreased strength;Decreased activity tolerance;Impaired balance (sitting and/or standing)      OT Treatment/Interventions: Self-care/ADL training;Therapeutic exercise;Therapeutic activities;Patient/family education;Balance training    OT Goals(Current goals can be found in the care plan section) Acute Rehab OT Goals Patient Stated Goal: return home OT Goal Formulation: With patient Time For Goal Achievement: 06/02/21 Potential to Achieve Goals: Good  OT Frequency: Min 2X/week    Co-evaluation PT/OT/SLP Co-Evaluation/Treatment: Yes Reason for Co-Treatment: To address functional/ADL transfers   OT goals addressed during session: ADL's and self-care                       End of Session Equipment Utilized During  Treatment: Rolling walker (2 wheels)  Activity Tolerance: Patient tolerated treatment well Patient left: in chair;with call bell/phone within reach  OT Visit Diagnosis: Unsteadiness on feet (R26.81);Other abnormalities of gait and mobility (R26.89);Muscle weakness (generalized) (M62.81)                Time: 8206-0156 OT Time Calculation (min): 19 min Charges:  OT General Charges $OT Visit: 1 Visit OT Evaluation $OT Eval Low Complexity: 1 Low  Yehudis Monceaux OT, MOT  Larey Seat 05/19/2021, 11:37 AM

## 2021-05-19 NOTE — Plan of Care (Signed)
°  Problem: Acute Rehab OT Goals (only OT should resolve) Goal: Pt. Will Perform Grooming Flowsheets (Taken 05/19/2021 1138) Pt Will Perform Grooming:  standing  with modified independence Goal: Pt. Will Perform Lower Body Bathing Flowsheets (Taken 05/19/2021 1138) Pt Will Perform Lower Body Bathing:  with modified independence  sit to/from stand  sitting/lateral leans Goal: Pt. Will Perform Lower Body Dressing Flowsheets (Taken 05/19/2021 1138) Pt Will Perform Lower Body Dressing:  with modified independence  sitting/lateral leans  sit to/from stand Goal: Pt. Will Transfer To Toilet Flowsheets (Taken 05/19/2021 1138) Pt Will Transfer to Toilet:  with modified independence  ambulating Goal: Pt/Caregiver Will Perform Home Exercise Program Flowsheets (Taken 05/19/2021 1138) Pt/caregiver will Perform Home Exercise Program:  Increased strength  Both right and left upper extremity  Independently  [name] OT, MOT

## 2021-05-19 NOTE — Plan of Care (Signed)
°  Problem: Acute Rehab PT Goals(only PT should resolve) Goal: Pt Will Go Supine/Side To Sit Outcome: Progressing Flowsheets (Taken 05/19/2021 1403) Pt will go Supine/Side to Sit:  Independently  with modified independence Goal: Patient Will Transfer Sit To/From Stand Outcome: Progressing Flowsheets (Taken 05/19/2021 1403) Patient will transfer sit to/from stand:  with supervision  with modified independence Goal: Pt Will Transfer Bed To Chair/Chair To Bed Outcome: Progressing Flowsheets (Taken 05/19/2021 1403) Pt will Transfer Bed to Chair/Chair to Bed:  with supervision  with modified independence Goal: Pt Will Ambulate Outcome: Progressing Flowsheets (Taken 05/19/2021 1403) Pt will Ambulate:  75 feet  with min guard assist  with rolling walker  with cane   2:04 PM, 05/19/21 Lonell Grandchild, MPT Physical Therapist with [facility] [phone] office [phone] mobile phone

## 2021-05-19 NOTE — Care Management Important Message (Signed)
Important Message  Patient Details  Name: Megan [name] MRN: [id number] Date of Birth: Aug 04, 1945   Medicare Important Message Given:  N/A - LOS <3 / Initial given by admissions     Tommy Medal 05/19/2021, 12:52 PM

## 2021-05-20 DIAGNOSIS — U071 COVID-19: Secondary | ICD-10-CM | POA: Diagnosis not present

## 2021-05-20 DIAGNOSIS — N3000 Acute cystitis without hematuria: Secondary | ICD-10-CM

## 2021-05-20 LAB — BASIC METABOLIC PANEL
Anion gap: 7 (ref 5–15)
BUN: 14 mg/dL (ref 8–23)
CO2: 27 mmol/L (ref 22–32)
Calcium: 8.1 mg/dL — ABNORMAL LOW (ref 8.9–10.3)
Chloride: 105 mmol/L (ref 98–111)
Creatinine, Ser: 0.75 mg/dL (ref 0.44–1.00)
GFR, Estimated: >60 mL/min (ref >60)
Glucose, Bld: 91 mg/dL (ref 70–99)
Potassium: 3.2 mmol/L — ABNORMAL LOW (ref 3.5–5.1)
Sodium: 139 mmol/L (ref 135–145)

## 2021-05-20 LAB — CBC
HCT: 41 % (ref 36.0–46.0)
HCT: 44.4 % (ref 36.0–46.0)
Hemoglobin: 13.1 g/dL (ref 12.0–15.0)
Hemoglobin: 14.1 g/dL (ref 12.0–15.0)
MCH: 31.6 pg (ref 26.0–34.0)
MCH: 32.5 pg (ref 26.0–34.0)
MCHC: 32 g/dL (ref 30.0–36.0)
MCHC: 31.8 g/dL (ref 30.0–36.0)
MCV: 99 fL (ref 80.0–100.0)
MCV: 102.3 fL — ABNORMAL HIGH (ref 80.0–100.0)
Platelets: 237 10*3/uL (ref 150–400)
Platelets: 181 10*3/uL (ref 150–400)
RBC: 4.14 MIL/uL (ref 3.87–5.11)
RBC: 4.34 MIL/uL (ref 3.87–5.11)
RDW: 13.7 % (ref 11.5–15.5)
RDW: 13.9 % (ref 11.5–15.5)
WBC: 10.9 10*3/uL — ABNORMAL HIGH (ref 4.0–10.5)
WBC: 11.1 10*3/uL — ABNORMAL HIGH (ref 4.0–10.5)
nRBC: 0 % (ref 0.0–0.2)
nRBC: 0 % (ref 0.0–0.2)

## 2021-05-20 LAB — GLUCOSE, CAPILLARY
Glucose-Capillary: 92 mg/dL (ref 70–99)
Glucose-Capillary: 101 mg/dL — ABNORMAL HIGH (ref 70–99)
Glucose-Capillary: 98 mg/dL (ref 70–99)
Glucose-Capillary: 128 mg/dL — ABNORMAL HIGH (ref 70–99)

## 2021-05-20 LAB — PROTIME-INR
INR: 2.2 — ABNORMAL HIGH (ref 0.8–1.2)
Prothrombin Time: 24.1 seconds — ABNORMAL HIGH (ref 11.4–15.2)

## 2021-05-20 LAB — HEMOGLOBIN A1C
Hgb A1c MFr Bld: 6.2 % — ABNORMAL HIGH (ref 4.8–5.6)
Mean Plasma Glucose: 131 mg/dL

## 2021-05-20 MED ORDER — POTASSIUM CHLORIDE CRYS ER 20 MEQ PO TBCR
40.0000 meq | EXTENDED_RELEASE_TABLET | Freq: Once | ORAL | Status: AC
  Administered 2021-05-20: 40 meq via ORAL
  Filled 2021-05-20: qty 2

## 2021-05-20 MED ORDER — ALBUTEROL SULFATE HFA 108 (90 BASE) MCG/ACT IN AERS
2.0000 | INHALATION_SPRAY | Freq: Four times a day (QID) | RESPIRATORY_TRACT | Status: DC
  Administered 2021-05-20 – 2021-05-21 (×4): 2 via RESPIRATORY_TRACT

## 2021-05-20 MED ORDER — WARFARIN SODIUM 1 MG PO TABS
3.0000 mg | ORAL_TABLET | Freq: Once | ORAL | Status: AC
  Administered 2021-05-20: 3 mg via ORAL
  Filled 2021-05-20: qty 1

## 2021-05-20 NOTE — TOC Progression Note (Signed)
Transition of Care [facility]) - Progression Note    Patient Details  Name: Megan [name] MRN: [id number] Date of Birth: 1945-12-27  Transition of Care [facility]) CM/SW Contact  Ihor Gully, LCSW Phone Number: 05/20/2021, 11:40 AM  Clinical Narrative:    Patient is now agreeable to go to SNF for short-term rehab. Due to COVID status, patient can only be referred to [facility] and [facility]. Patient advised of this.    Expected Discharge Plan: [place] Barriers to Discharge: Continued Medical Work up  Expected Discharge Plan and Services Expected Discharge Plan: [place] arrangements for the past 2 months: Single Family Home                                       Social Determinants of Health (SDOH) Interventions    Readmission Risk Interventions No flowsheet data found.

## 2021-05-20 NOTE — NC FL2 (Signed)
[place] MEDICAID FL2 LEVEL OF CARE SCREENING TOOL     IDENTIFICATION  Patient Name: Megan [name] Birthdate: 07-09-1945 Sex: female Admission Date (Current Location): 05/17/2021  [facility] and [place] Number:  [employer] and Address:  [facility] [address], [place]      Provider Number: [id number]  Attending Physician Name and Address:  Orson Eva, MD  Relative Name and Phone Number:  Cherilynn, [place] [phone])   [phone]    Current Level of Care: SNF Recommended Level of Care: [place] Prior Approval Number:    Date Approved/Denied:   PASRR Number: [id number]  Discharge Plan: SNF    Current Diagnoses: Patient Active Problem List   Diagnosis Date Noted   Hyperglycemia 05/18/2021   Sepsis ([place]) 05/17/2021   COVID-19 virus infection 05/17/2021   Chronic congestive heart failure ([place]) 08/24/2019   Intertrigo 05/11/2017   OAB (overactive bladder) 02/22/2017   Lymphedema 01/26/2017   Gastroesophageal reflux disease without esophagitis 01/26/2017   Acquired hypothyroidism 01/26/2017   Mixed hyperlipidemia 01/26/2017   Generalized weakness 08/17/2015   Pyrexia    Sleeps in sitting position due to orthopnea 07/31/2015   Morbid obesity due to excess calories ([place]) 06/09/2015   PVD (peripheral vascular disease) with claudication ([place]) 06/09/2015   Long term (current) use of anticoagulants 06/19/2010   Tachycardia-bradycardia syndrome ([place]) 11/18/2008   PPM-[place] 11/18/2008   HYPERTENSION, BENIGN 02/21/2008   ATRIAL FIBRILLATION 02/21/2008    Orientation RESPIRATION BLADDER Height & Weight     Self, Time, Situation, Place  Normal Continent Weight: 283 lb 11.7 oz (128.7 kg) Height:  5' (152.4 cm)  BEHAVIORAL SYMPTOMS/MOOD NEUROLOGICAL BOWEL NUTRITION STATUS      Continent Diet (heart healthy)  AMBULATORY STATUS COMMUNICATION OF NEEDS Skin     Verbally Normal                       Personal Care  Assistance Level of Assistance  Bathing, Feeding, Dressing   Feeding assistance: Independent Dressing Assistance: Limited assistance     Functional Limitations Info  Sight, Hearing, Speech Sight Info: Adequate Hearing Info: Adequate Speech Info: Adequate    SPECIAL CARE FACTORS FREQUENCY  PT (By licensed PT)     PT Frequency: 5x/week              Contractures Contractures Info: Not present    Additional Factors Info  Code Status, Allergies, Isolation Precautions Code Status Info: DNR Allergies Info: NKA     Isolation Precautions Info: COVID + on 05/17/21     Current Medications (05/20/2021):  This is the current hospital active medication list Current Facility-Administered Medications  Medication Dose Route Frequency Provider Last Rate Last Admin   acetaminophen (TYLENOL) tablet 650 mg  650 mg Oral Q6H PRN Zierle-Ghosh, Asia B, DO       Or   acetaminophen (TYLENOL) suppository 650 mg  650 mg Rectal Q6H PRN Zierle-Ghosh, Asia B, DO       albuterol (VENTOLIN HFA) 108 (90 Base) MCG/ACT inhaler 2 puff  2 puff Inhalation Q6H Memon, Jolaine Artist, MD   2 puff at 05/20/21 0733   atorvastatin (LIPITOR) tablet 80 mg  80 mg Oral Daily Zierle-Ghosh, Asia B, DO   80 mg at 05/20/21 9211   bisacodyl (DULCOLAX) suppository 10 mg  10 mg Rectal Once Kathie Dike, MD       cefTRIAXone (ROCEPHIN) 2 g in sodium chloride 0.9 % 100 mL IVPB  2  g Intravenous Q24H Zierle-Ghosh, Asia B, DO 200 mL/hr at 05/19/21 1811 2 g at 05/19/21 1811   fluticasone (FLONASE) 50 MCG/ACT nasal spray 1 spray  1 spray Each Nare Daily Zierle-Ghosh, Asia B, DO   1 spray at 05/20/21 0921   insulin aspart (novoLOG) injection 0-15 Units  0-15 Units Subcutaneous TID WC Zierle-Ghosh, Asia B, DO   2 Units at 05/19/21 4008   levothyroxine (SYNTHROID) tablet 150 mcg  150 mcg Oral Q0600 Kathie Dike, MD   150 mcg at 05/20/21 0617   metoprolol tartrate (LOPRESSOR) tablet 50 mg  50 mg Oral BID Kathie Dike, MD   50 mg at  05/20/21 6761   nystatin cream (MYCOSTATIN)   Topical BID Kathie Dike, MD   Given at 05/20/21 0921   ondansetron (ZOFRAN) tablet 4 mg  4 mg Oral Q6H PRN Zierle-Ghosh, Asia B, DO       Or   ondansetron (ZOFRAN) injection 4 mg  4 mg Intravenous Q6H PRN Zierle-Ghosh, Asia B, DO       oxyCODONE (Oxy IR/ROXICODONE) immediate release tablet 5 mg  5 mg Oral Q4H PRN Zierle-Ghosh, Asia B, DO       pantoprazole (PROTONIX) EC tablet 40 mg  40 mg Oral Daily Zierle-Ghosh, Asia B, DO   40 mg at 05/20/21 9509   warfarin (COUMADIN) tablet 3 mg  3 mg Oral Darius Bump, MD       Warfarin - Pharmacist Dosing Inpatient   Does not apply [id number] Kathie Dike, MD         Discharge Medications: Please see discharge summary for a list of discharge medications.  Relevant Imaging Results:  Relevant Lab Results:   Additional Information SSN [id number]. [place] COVID+ on 05/17/21  Ihor Gully, LCSW

## 2021-05-20 NOTE — Progress Notes (Addendum)
PROGRESS NOTE  Megan [name] STM:[id number] DOB: 1945/11/13 DOA: 05/17/2021 PCP: Sharion Balloon, FNP  Brief History:  76 year old female, with history of morbid obesity, lymphedema, hypertension, atrial fibrillation on anticoagulation, here to the hospital with urinary tract infection and sepsis.  She was also COVID-positive.  Started on antibiotics and IV fluids.  Physical therapy to evaluate.  She lives at home with her son, but her son is often not at home since he is working.  She reports that she can normally ambulate with a walker.   Assessment and Plan: * Sepsis ([place])- (present on admission) On admission: Patient has a temperature of 101.8, heart rate 104, respiratory rate 35, blood pressure as low as 66/57, white blood cell count 19.6, lactic acid 4.2>>improved with IV fluids Sepsis order set utilized UA is indicative of UTI Urine culture--GNR Blood culture neg Chest x-ray shows no evidence of acute disease 3 L bolus in ED Rocephin 2 g started in ED, continue 2 g daily pending final culture data Lactic acid trending down Sepsis physiology resolved  Hyperglycemia Last A1c from 04/02/2021 was 5.8 Blood sugars have been running during this admission, but she was receiving on IV hydrocortisone Steroids discontinued on 2/21 and blood sugars are now improving  COVID-19 virus infection- (present on admission) Patient declines Paxlovid at this time as she has heard about 2 many side effects on TV No current oxygen requirement, no current wheezing Overall respiratory status currently stable on RA Continue to monitor  Mixed hyperlipidemia- (present on admission) Continue statin medication  Acquired hypothyroidism- (present on admission) TSH appears to be suppressed at 0.049 Reports that her Synthroid dose was recently increased from 150 to 127mcg Would decrease dose back to 143mcg and repeat TSH in 4 weeks  Gastroesophageal reflux disease without esophagitis-  (present on admission) Continue Protonix  Lymphedema Chronic, currently at baseline  Generalized weakness Seen by PT with recommendations for SNF Patient agreeable to placement  Morbid obesity due to excess calories ([place])- (present on admission) BMI 55.4 Will need lifestyle changes  Tachycardia-bradycardia syndrome ([place])- (present on admission) Status post permanent pacemaker  Permanent atrial fibrillation ([place]) Restarted on metoprolol since blood pressure is now elevated Continue warfarin  HYPERTENSION, BENIGN- (present on admission) Stable on metoprolol Restart diltiazem as blood pressure allows         Status is: Inpatient Remains inpatient appropriate because: severity of illness and sepsis requiring IV abx            Family Communication:  no Family at bedside  Consultants:  none  Code Status:  DNR  DVT Prophylaxis:  warfarin   Procedures: As Listed in Progress Note Above  Antibiotics: Ceftriaxone 2/19>>     Subjective: Patient denies fevers, chills, headache, chest pain, dyspnea, nausea, vomiting, diarrhea, abdominal pain, dysuria, hematuria, hematochezia, and melena. Still feeling generalized weakness  Objective: Vitals:   05/20/21 0534 05/20/21 0733 05/20/21 1152 05/20/21 1451  BP: (!) 152/84  (!) 144/90   Pulse: 98  89   Resp: 19  18   Temp: 98.5 F (36.9 C)  98.6 F (37 C)   TempSrc:   Oral   SpO2: 93% 94% 96% 96%  Weight:      Height:        Intake/Output Summary (Last 24 hours) at 05/20/2021 1830 Last data filed at 05/20/2021 1409 Gross per 24 hour  Intake 480 ml  Output 0 ml  Net 480 ml  Weight change:  Exam:  General:  Pt is alert, follows commands appropriately, not in acute distress HEENT: No icterus, No thrush, No neck mass, [place]/AT Cardiovascular: IRRR, S1/S2, no rubs, no gallops Respiratory: CTA bilaterally, no wheezing, no crackles, no rhonchi Abdomen: Soft/+BS, non tender, non distended, no  guarding Extremities: 1 + LE edema, No lymphangitis, No petechiae, No rashes, no synovitis   Data Reviewed: I have personally reviewed following labs and imaging studies Basic Metabolic Panel: Recent Labs  Lab 05/17/21 1626 05/18/21 0502 05/20/21 0504  NA 134* 137 139  K 4.8 4.0 3.2*  CL 98 106 105  CO2 21* 21* 27  GLUCOSE 219* 198* 91  BUN 14 12 14   CREATININE 1.08* 0.75 0.75  CALCIUM 9.1 8.0* 8.1*  MG  --  1.8  --    Liver Function Tests: Recent Labs  Lab 05/17/21 1733 05/18/21 0502  AST 27 18  ALT 25 21  ALKPHOS 109 88  BILITOT 1.0 0.9  PROT 6.9 5.8*  ALBUMIN 3.1* 2.6*   No results for input(s): LIPASE, AMYLASE in the last 168 hours. No results for input(s): AMMONIA in the last 168 hours. Coagulation Profile: Recent Labs  Lab 05/17/21 1925 05/18/21 0502 05/19/21 0448 05/20/21 0953  INR 3.4* 3.4* 3.7* 2.2*   CBC: Recent Labs  Lab 05/17/21 1626 05/18/21 0502 05/19/21 0448 05/20/21 0504 05/20/21 0905  WBC 19.6* 9.5 11.4* 10.9* 11.1*  NEUTROABS  --  8.8*  --   --   --   HGB 16.7* 12.7 12.2 13.1 14.1  HCT 53.5* 39.6 39.3 41.0 44.4  MCV 99.8 97.5 98.5 99.0 102.3*  PLT 252 199 209 237 181   Cardiac Enzymes: No results for input(s): CKTOTAL, CKMB, CKMBINDEX, TROPONINI in the last 168 hours. BNP: Invalid input(s): POCBNP CBG: Recent Labs  Lab 05/19/21 1710 05/19/21 2143 05/20/21 0726 05/20/21 1149 05/20/21 1653  GLUCAP 135* 106* 92 101* 98   HbA1C: Recent Labs    05/19/21 0448  HGBA1C 6.2*   Urine analysis:    Component Value Date/Time   COLORURINE YELLOW 05/17/2021 1822   APPEARANCEUR CLOUDY (A) 05/17/2021 1822   APPEARANCEUR Cloudy (A) 04/02/2021 1448   LABSPEC 1.025 05/17/2021 1822   PHURINE 5.0 05/17/2021 1822   GLUCOSEU NEGATIVE 05/17/2021 1822   HGBUR SMALL (A) 05/17/2021 1822   BILIRUBINUR NEGATIVE 05/17/2021 1822   BILIRUBINUR Negative 04/02/2021 1448   KETONESUR 5 (A) 05/17/2021 1822   PROTEINUR 30 (A) 05/17/2021 1822    UROBILINOGEN 0.2 02/01/2014 1811   NITRITE NEGATIVE 05/17/2021 1822   LEUKOCYTESUR MODERATE (A) 05/17/2021 1822   Sepsis Labs: @LABRCNTIP (procalcitonin:4,lacticidven:4) ) Recent Results (from the past 240 hour(s))  Blood Culture (routine x 2)     Status: None (Preliminary result)   Collection Time: 05/17/21  5:30 PM   Specimen: BLOOD RIGHT FOREARM  Result Value Ref Range Status   Specimen Description   Final    BLOOD RIGHT FOREARM BOTTLES DRAWN AEROBIC AND ANAEROBIC   Special Requests   Final    Blood Culture results may not be optimal due to an excessive volume of blood received in culture bottles   Culture   Final    NO GROWTH 3 DAYS Performed at [facility], [address], [place], [place] [address]    Report Status PENDING  Incomplete  Blood Culture (routine x 2)     Status: None (Preliminary result)   Collection Time: 05/17/21  5:30 PM   Specimen: BLOOD LEFT ARM  Result Value Ref  Range Status   Specimen Description BLOOD LEFT ARM BOTTLES DRAWN AEROBIC AND ANAEROBIC  Final   Special Requests Blood Culture adequate volume  Final   Culture   Final    NO GROWTH 3 DAYS Performed at [facility], [address] [place], [place] [address]    Report Status PENDING  Incomplete  Resp Panel by RT-PCR (Flu A&B, Covid) Nasopharyngeal Swab     Status: Abnormal   Collection Time: 05/17/21  5:35 PM   Specimen: Nasopharyngeal Swab; Nasopharyngeal(NP) swabs in vial transport medium  Result Value Ref Range Status   SARS Coronavirus 2 by RT PCR POSITIVE (A) NEGATIVE Final    Comment: (NOTE) SARS-CoV-2 target nucleic acids are DETECTED.  The SARS-CoV-2 RNA is generally detectable in upper respiratory specimens during the acute phase of infection. Positive results are indicative of the presence of the identified virus, but do not rule out bacterial infection or co-infection with other pathogens not detected by the test. Clinical correlation with patient history and other diagnostic  information is necessary to determine patient infection status. The expected result is Negative.  Fact Sheet for Patients: EntrepreneurPulse.com.au  Fact Sheet for Healthcare Providers: IncredibleEmployment.be  This test is not yet approved or cleared by the [place] FDA and  has been authorized for detection and/or diagnosis of SARS-CoV-2 by FDA under an Emergency Use Authorization (EUA).  This EUA will remain in effect (meaning this test can be used) for the duration of  the COVID-19 declaration under Section 564(b)(1) of the A ct, 21 U.S.C. section 360bbb-3(b)(1), unless the authorization is terminated or revoked sooner.     Influenza A by PCR NEGATIVE NEGATIVE Final   Influenza B by PCR NEGATIVE NEGATIVE Final    Comment: (NOTE) The Xpert Xpress SARS-CoV-2/FLU/RSV plus assay is intended as an aid in the diagnosis of influenza from Nasopharyngeal swab specimens and should not be used as a sole basis for treatment. Nasal washings and aspirates are unacceptable for Xpert Xpress SARS-CoV-2/FLU/RSV testing.  Fact Sheet for Patients: EntrepreneurPulse.com.au  Fact Sheet for Healthcare Providers: IncredibleEmployment.be  This test is not yet approved or cleared by the [place] FDA and has been authorized for detection and/or diagnosis of SARS-CoV-2 by FDA under an Emergency Use Authorization (EUA). This EUA will remain in effect (meaning this test can be used) for the duration of the COVID-19 declaration under Section 564(b)(1) of the Act, 21 U.S.C. section 360bbb-3(b)(1), unless the authorization is terminated or revoked.  Performed at [facility], [address] [place], [place] [address]   Urine Culture     Status: Abnormal (Preliminary result)   Collection Time: 05/17/21  6:22 PM   Specimen: In/Out Cath Urine  Result Value Ref Range Status   Specimen Description   Final    IN/OUT CATH  URINE Performed at [facility], [address], [place], [place] [address]    Special Requests   Final    NONE Performed at [facility], [address], [place], [place] [address]    Culture (A)  Final    10,000 COLONIES/mL ESCHERICHIA COLI SUSCEPTIBILITIES TO FOLLOW Performed at [facility], [place] [address], [place], [place] [address]    Report Status PENDING  Incomplete     Scheduled Meds:  albuterol  2 puff Inhalation Q6H   atorvastatin  80 mg Oral Daily   bisacodyl  10 mg Rectal Once   fluticasone  1 spray Each Nare Daily   insulin aspart  0-15 Units Subcutaneous TID  WC   levothyroxine  150 mcg Oral Q0600   metoprolol tartrate  50 mg Oral BID   nystatin cream   Topical BID   pantoprazole  40 mg Oral Daily   Warfarin - Pharmacist Dosing Inpatient   Does not apply q1600   Continuous Infusions:  cefTRIAXone (ROCEPHIN)  IV 2 g (05/20/21 1816)    Procedures/Studies: DG Chest Port 1 View  Result Date: 05/17/2021 CLINICAL DATA:  Questionable sepsis - evaluate for abnormality EXAM: PORTABLE CHEST 1 VIEW COMPARISON:  January 20, 2021. FINDINGS: No consolidation. No visible pleural effusions or pneumothorax. Cardiomediastinal silhouette is enlarged, likely accentuated by AP technique and low lung volumes. Left dual lead subclavian approach cardiac rhythm maintenance device. Overall, similar appearance of the chest in comparison to the priors. IMPRESSION: No evidence of acute cardiopulmonary disease. Electronically Signed   By: Margaretha Sheffield M.D.   On: 05/17/2021 18:02    Orson Eva, DO  [facility]  If 7PM-7AM, please contact night-coverage [email or web address] Password [email or web address] 05/20/2021, 6:30 PM   LOS: 3 days

## 2021-05-20 NOTE — Progress Notes (Signed)
ANTICOAGULATION CONSULT NOTE -   Pharmacy Consult for Coumadin Indication: atrial fibrillation  No Known Allergies  Patient Measurements: Height: 5' (152.4 cm) Weight: 128.7 kg (283 lb 11.7 oz) IBW/kg (Calculated) : 45.5  Vital Signs: Temp: 98.5 F (36.9 C) (02/22 0534) BP: 152/84 (02/22 0534) Pulse Rate: 98 (02/22 0534)  Labs: Recent Labs    05/17/21 1626 05/17/21 1925 05/18/21 0502 05/19/21 0448 05/20/21 0504 05/20/21 0905 05/20/21 0953  HGB 16.7*  --  12.7 12.2 13.1 14.1  --   HCT 53.5*  --  39.6 39.3 41.0 44.4  --   PLT 252  --  199 209 237 181  --   LABPROT  --    < > 34.2* 36.3*  --   --  24.1*  INR  --    < > 3.4* 3.7*  --   --  2.2*  CREATININE 1.08*  --  0.75  --  0.75  --   --    < > = values in this interval not displayed.     Estimated Creatinine Clearance: 73.3 mL/min (by C-G formula based on SCr of 0.75 mg/dL).   Medical History: Past Medical History:  Diagnosis Date   Acute diastolic heart failure (HCC)    Anxiety state, unspecified    Atrial fibrillation (HCC)    Congestive heart failure, unspecified    Degenerative joint disease    GERD (gastroesophageal reflux disease)    Hyperlipidemia    Hypertension    Lymphedema    Obesity    OSA (obstructive sleep apnea) 06/09/2015   Persistent atrial fibrillation (HCC)     post-termination pauses with afib s/p PPM   Presence of permanent cardiac pacemaker    Sinoatrial node dysfunction (HCC)    s/p PPM   Sleep apnea    compliant with CPAP   Unspecified hypothyroidism    Unspecified venous (peripheral) insufficiency     Medications:  Medications Prior to Admission  Medication Sig Dispense Refill Last Dose   albuterol (VENTOLIN HFA) 108 (90 Base) MCG/ACT inhaler INHALE 2 PUFFS BY MOUTH EVERY 6 HOURS AS NEEDED FOR WHEEZING OR SHORTNESS OF BREATH (Patient taking differently: Inhale 2 puffs into the lungs every 6 (six) hours as needed for shortness of breath.) 18 g 0 unknown   atorvastatin  (LIPITOR) 80 MG tablet Take 1 tablet by mouth once daily 90 tablet 1 Past Week   clotrimazole-betamethasone (LOTRISONE) cream APPLY  CREAM TOPICALLY TO AFFECTED AREA TWICE DAILY (Patient taking differently: Apply 1 application topically 2 (two) times daily.) 45 g 0 Past Month   diltiazem (CARDIZEM CD) 240 MG 24 hr capsule Take 1 capsule (240 mg total) by mouth daily. 90 capsule 1 Past Week   esomeprazole (NEXIUM) 40 MG capsule Take 1 capsule (40 mg total) by mouth daily. 90 capsule 1 Past Week   levothyroxine (SYNTHROID) 175 MCG tablet Take 1 tablet (175 mcg total) by mouth daily before breakfast. 90 tablet 1 Past Week   metoprolol tartrate (LOPRESSOR) 100 MG tablet Take 1/2 (one-half) tablet by mouth twice daily (Patient taking differently: Take 50 mg by mouth 2 (two) times daily.) 90 tablet 2 05/16/2021 at am   mometasone (NASONEX) 50 MCG/ACT nasal spray Place 2 sprays into the nose daily. 1 each 12 unknown   Vitamin D, Ergocalciferol, (DRISDOL) 1.25 MG (50000 UNIT) CAPS capsule Take 1 capsule by mouth once a week (Patient taking differently: Take 50,000 Units by mouth every 7 (seven) days.) 12 capsule 1 Past Week  warfarin (COUMADIN) 3 MG tablet TAKE 1 TABLET BY MOUTH ONCE DAILY EXCEPT TAKE 1/2 (ONE-HALF) TABLET ONCE DAILY ON MONDAYS OR AS DIRECTED (Patient taking differently: Take 1.5-3 mg by mouth See admin instructions. 1 TABLET BY MOUTH ONCE DAILY EXCEPT TAKE 1/2 (ONE-HALF) TABLET ONCE DAILY ON MONDAYS OR AS DIRECTED) 35 tablet 5 05/16/2021 at am   blood glucose meter kit and supplies Dispense based on patient and insurance preference. Use up to four times daily as directed. (FOR ICD-10 E10.9, E11.9). 1 each 0    cephALEXin (KEFLEX) 500 MG capsule Take 1 capsule (500 mg total) by mouth 2 (two) times daily. (Patient not taking: Reported on 05/17/2021) 14 capsule 0 Completed Course   metroNIDAZOLE (METROGEL) 1 % gel Apply topically daily. (Patient not taking: Reported on 05/17/2021) 45 g 0 Completed  Course    Assessment: Patient is a 76 yo female with chronic coumadin therapy for afib. Home dose listed as 1.5 on Mon and 3 mg ROW  INR 3.4> 3.7>> 2.2, therapeutic  Goal of Therapy:  INR 2.2-2.5 per anticoag clinic Monitor platelets by anticoagulation protocol: Yes   Plan:  Warfarin 3 mg x 1 dose. Daily PT-INR Monitor for S/S of bleeding  Margot Ables, PharmD Clinical Pharmacist 05/20/2021 10:44 AM

## 2021-05-21 DIAGNOSIS — Z7901 Long term (current) use of anticoagulants: Secondary | ICD-10-CM | POA: Diagnosis not present

## 2021-05-21 DIAGNOSIS — M25562 Pain in left knee: Secondary | ICD-10-CM | POA: Diagnosis not present

## 2021-05-21 DIAGNOSIS — Z66 Do not resuscitate: Secondary | ICD-10-CM | POA: Diagnosis not present

## 2021-05-21 DIAGNOSIS — R059 Cough, unspecified: Secondary | ICD-10-CM | POA: Diagnosis not present

## 2021-05-21 DIAGNOSIS — I495 Sick sinus syndrome: Secondary | ICD-10-CM | POA: Diagnosis not present

## 2021-05-21 DIAGNOSIS — C9000 Multiple myeloma not having achieved remission: Secondary | ICD-10-CM | POA: Diagnosis not present

## 2021-05-21 DIAGNOSIS — M79662 Pain in left lower leg: Secondary | ICD-10-CM | POA: Diagnosis not present

## 2021-05-21 DIAGNOSIS — K8020 Calculus of gallbladder without cholecystitis without obstruction: Secondary | ICD-10-CM | POA: Diagnosis not present

## 2021-05-21 DIAGNOSIS — Z8616 Personal history of COVID-19: Secondary | ICD-10-CM | POA: Diagnosis not present

## 2021-05-21 DIAGNOSIS — I4891 Unspecified atrial fibrillation: Secondary | ICD-10-CM | POA: Diagnosis not present

## 2021-05-21 DIAGNOSIS — E441 Mild protein-calorie malnutrition: Secondary | ICD-10-CM | POA: Diagnosis not present

## 2021-05-21 DIAGNOSIS — I5032 Chronic diastolic (congestive) heart failure: Secondary | ICD-10-CM | POA: Diagnosis not present

## 2021-05-21 DIAGNOSIS — J441 Chronic obstructive pulmonary disease with (acute) exacerbation: Secondary | ICD-10-CM | POA: Diagnosis not present

## 2021-05-21 DIAGNOSIS — R2689 Other abnormalities of gait and mobility: Secondary | ICD-10-CM | POA: Diagnosis not present

## 2021-05-21 DIAGNOSIS — E785 Hyperlipidemia, unspecified: Secondary | ICD-10-CM | POA: Diagnosis not present

## 2021-05-21 DIAGNOSIS — R600 Localized edema: Secondary | ICD-10-CM | POA: Diagnosis not present

## 2021-05-21 DIAGNOSIS — A4151 Sepsis due to Escherichia coli [E. coli]: Secondary | ICD-10-CM | POA: Diagnosis not present

## 2021-05-21 DIAGNOSIS — R739 Hyperglycemia, unspecified: Secondary | ICD-10-CM | POA: Diagnosis not present

## 2021-05-21 DIAGNOSIS — R001 Bradycardia, unspecified: Secondary | ICD-10-CM | POA: Diagnosis not present

## 2021-05-21 DIAGNOSIS — J9811 Atelectasis: Secondary | ICD-10-CM | POA: Diagnosis not present

## 2021-05-21 DIAGNOSIS — I110 Hypertensive heart disease with heart failure: Secondary | ICD-10-CM | POA: Diagnosis not present

## 2021-05-21 DIAGNOSIS — E039 Hypothyroidism, unspecified: Secondary | ICD-10-CM | POA: Diagnosis present

## 2021-05-21 DIAGNOSIS — J209 Acute bronchitis, unspecified: Secondary | ICD-10-CM | POA: Diagnosis not present

## 2021-05-21 DIAGNOSIS — R000 Tachycardia, unspecified: Secondary | ICD-10-CM | POA: Diagnosis not present

## 2021-05-21 DIAGNOSIS — U071 COVID-19: Secondary | ICD-10-CM | POA: Diagnosis not present

## 2021-05-21 DIAGNOSIS — I10 Essential (primary) hypertension: Secondary | ICD-10-CM | POA: Diagnosis present

## 2021-05-21 DIAGNOSIS — M79605 Pain in left leg: Secondary | ICD-10-CM | POA: Diagnosis not present

## 2021-05-21 DIAGNOSIS — Z87891 Personal history of nicotine dependence: Secondary | ICD-10-CM | POA: Diagnosis not present

## 2021-05-21 DIAGNOSIS — N3000 Acute cystitis without hematuria: Secondary | ICD-10-CM | POA: Diagnosis not present

## 2021-05-21 DIAGNOSIS — I509 Heart failure, unspecified: Secondary | ICD-10-CM | POA: Diagnosis not present

## 2021-05-21 DIAGNOSIS — K219 Gastro-esophageal reflux disease without esophagitis: Secondary | ICD-10-CM | POA: Diagnosis present

## 2021-05-21 DIAGNOSIS — M25512 Pain in left shoulder: Secondary | ICD-10-CM | POA: Diagnosis not present

## 2021-05-21 DIAGNOSIS — Z79899 Other long term (current) drug therapy: Secondary | ICD-10-CM | POA: Diagnosis not present

## 2021-05-21 DIAGNOSIS — R0602 Shortness of breath: Secondary | ICD-10-CM | POA: Diagnosis present

## 2021-05-21 DIAGNOSIS — Z20822 Contact with and (suspected) exposure to covid-19: Secondary | ICD-10-CM | POA: Diagnosis not present

## 2021-05-21 DIAGNOSIS — Z950 Presence of cardiac pacemaker: Secondary | ICD-10-CM | POA: Diagnosis not present

## 2021-05-21 DIAGNOSIS — Z743 Need for continuous supervision: Secondary | ICD-10-CM | POA: Diagnosis not present

## 2021-05-21 DIAGNOSIS — Z5181 Encounter for therapeutic drug level monitoring: Secondary | ICD-10-CM | POA: Diagnosis not present

## 2021-05-21 DIAGNOSIS — I4821 Permanent atrial fibrillation: Secondary | ICD-10-CM

## 2021-05-21 DIAGNOSIS — R262 Difficulty in walking, not elsewhere classified: Secondary | ICD-10-CM | POA: Diagnosis not present

## 2021-05-21 DIAGNOSIS — M899 Disorder of bone, unspecified: Secondary | ICD-10-CM | POA: Diagnosis present

## 2021-05-21 DIAGNOSIS — R279 Unspecified lack of coordination: Secondary | ICD-10-CM | POA: Diagnosis not present

## 2021-05-21 DIAGNOSIS — Z7401 Bed confinement status: Secondary | ICD-10-CM | POA: Diagnosis not present

## 2021-05-21 DIAGNOSIS — R2243 Localized swelling, mass and lump, lower limb, bilateral: Secondary | ICD-10-CM | POA: Diagnosis not present

## 2021-05-21 DIAGNOSIS — Z23 Encounter for immunization: Secondary | ICD-10-CM | POA: Diagnosis not present

## 2021-05-21 DIAGNOSIS — E559 Vitamin D deficiency, unspecified: Secondary | ICD-10-CM | POA: Diagnosis not present

## 2021-05-21 DIAGNOSIS — R5381 Other malaise: Secondary | ICD-10-CM | POA: Diagnosis not present

## 2021-05-21 DIAGNOSIS — A419 Sepsis, unspecified organism: Secondary | ICD-10-CM | POA: Diagnosis not present

## 2021-05-21 DIAGNOSIS — N390 Urinary tract infection, site not specified: Secondary | ICD-10-CM | POA: Diagnosis not present

## 2021-05-21 DIAGNOSIS — N3001 Acute cystitis with hematuria: Secondary | ICD-10-CM | POA: Diagnosis not present

## 2021-05-21 DIAGNOSIS — M6281 Muscle weakness (generalized): Secondary | ICD-10-CM | POA: Diagnosis not present

## 2021-05-21 DIAGNOSIS — J219 Acute bronchiolitis, unspecified: Secondary | ICD-10-CM | POA: Diagnosis not present

## 2021-05-21 DIAGNOSIS — R531 Weakness: Secondary | ICD-10-CM | POA: Diagnosis not present

## 2021-05-21 DIAGNOSIS — I890 Lymphedema, not elsewhere classified: Secondary | ICD-10-CM | POA: Diagnosis not present

## 2021-05-21 LAB — URINE CULTURE: Culture: 10000 — AB

## 2021-05-21 LAB — GLUCOSE, CAPILLARY
Glucose-Capillary: 87 mg/dL (ref 70–99)
Glucose-Capillary: 118 mg/dL — ABNORMAL HIGH (ref 70–99)
Glucose-Capillary: 134 mg/dL — ABNORMAL HIGH (ref 70–99)

## 2021-05-21 LAB — BASIC METABOLIC PANEL
Anion gap: 8 (ref 5–15)
BUN: 14 mg/dL (ref 8–23)
CO2: 27 mmol/L (ref 22–32)
Calcium: 8.1 mg/dL — ABNORMAL LOW (ref 8.9–10.3)
Chloride: 104 mmol/L (ref 98–111)
Creatinine, Ser: 0.78 mg/dL (ref 0.44–1.00)
GFR, Estimated: >60 mL/min (ref >60)
Glucose, Bld: 91 mg/dL (ref 70–99)
Potassium: 3.3 mmol/L — ABNORMAL LOW (ref 3.5–5.1)
Sodium: 139 mmol/L (ref 135–145)

## 2021-05-21 LAB — PROTIME-INR
INR: 2 — ABNORMAL HIGH (ref 0.8–1.2)
Prothrombin Time: 22.4 seconds — ABNORMAL HIGH (ref 11.4–15.2)

## 2021-05-21 MED ORDER — ALBUTEROL SULFATE HFA 108 (90 BASE) MCG/ACT IN AERS: 2.0000 | INHALATION_SPRAY | Freq: Two times a day (BID) | RESPIRATORY_TRACT | Status: DC

## 2021-05-21 MED ORDER — DILTIAZEM HCL ER COATED BEADS 240 MG PO CP24
240.0000 mg | ORAL_CAPSULE | Freq: Every day | ORAL | Status: DC
  Administered 2021-05-21: 240 mg via ORAL
  Filled 2021-05-21: qty 1

## 2021-05-21 MED ORDER — CEFDINIR 300 MG PO CAPS
300.0000 mg | ORAL_CAPSULE | Freq: Two times a day (BID) | ORAL | Status: DC
  Administered 2021-05-21: 300 mg via ORAL
  Filled 2021-05-21: qty 1

## 2021-05-21 MED ORDER — WARFARIN SODIUM 2 MG PO TABS
3.0000 mg | ORAL_TABLET | Freq: Once | ORAL | Status: AC
  Administered 2021-05-21: 3 mg via ORAL
  Filled 2021-05-21: qty 1

## 2021-05-21 MED ORDER — CEFDINIR 300 MG PO CAPS: 300.0000 mg | ORAL_CAPSULE | Freq: Two times a day (BID) | ORAL | 0 refills | Status: DC

## 2021-05-21 NOTE — Progress Notes (Signed)
Occupational Therapy Treatment Patient Details Name: Megan [name] MRN: [id number] DOB: 1945-08-07 Today's Date: 05/21/2021   History of present illness Megan [name] is a 76 y.o. female with medical history significant of with history of acute diastolic heart failure, atrial fibrillation, lipidemia, hypertension, OSA but does not wear CPAP, hypothyroidism, previous insufficiency, and more presents to the ED with a chief complaint of so weak that she could not walk.  Patient reports that she normally ambulates at home with a cane, but has not been able to do so over the last 2-3 days.  Patient reports that since she has felt weak and been afraid that she would fall she has not gotten up at all.  ED provider reported that she was saturated in urine upon arrival.  Patient does live with her son at home.  She reports that he still been able to get food to her.  Her appetite has been slightly decreased, for example on the day of presentation all she had was half of a bacon egg biscuit from [employer].  Patient denies any symptoms of fever at home.  She does have a productive cough with clear/yellow sputum.  The sputum causes her to be nauseous.  Patient denies chest pain, abdominal pain.  Patient denies shortness of breath or wheezing.  In January patient was started on Keflex for UTI.  At the end of January she was prescribed prednisone, which she reports is for bronchitis.  The prednisone caused her glucose to go up to 200, so patient stopped taking it.  She reports when she became so weak the past couple of days she tried prednisone to see if would make her stronger, but it did not.  Patient reports that she has diet-controlled diabetes.  Patient reports that she has had dysuria but no hematuria.   OT comments  Pt agreeable to OT and PT co-treatment today. Pt demonstrated increased need for assist for bed mobility with HOB flat. Pt struggled with bringing B LE to edge of bed needing physical assist. Pt  required total assist for donning socks in bed. Pt able to functionally ambulate in room and complete washing of hands at the sink with supervision to min guard assist. Pt continues to be generally weak need to sit after brief ambulation to door and standing to wash hands with RW. Pt able to complete B UE strengthening with rest breaks between exercises. Pt will benefit from continued OT in the hospital and recommended venue below to increase strength, balance, and endurance for safe ADL's.      Recommendations for follow up therapy are one component of a multi-disciplinary discharge planning process, led by the attending physician.  Recommendations may be updated based on patient status, additional functional criteria and insurance authorization.    Follow Up Recommendations  Skilled nursing-short term rehab (<3 hours/day)    Assistance Recommended at Discharge Intermittent Supervision/Assistance  Patient can return home with the following  A little help with walking and/or transfers;A little help with bathing/dressing/bathroom;Assistance with cooking/housework;Assist for transportation;Help with stairs or ramp for entrance   Equipment Recommendations  None recommended by OT          Precautions / Restrictions Precautions Precautions: Fall Restrictions Weight Bearing Restrictions: No       Mobility Bed Mobility Overal bed mobility: Needs Assistance Bed Mobility: Supine to Sit     Supine to sit: Min assist, Mod assist     General bed mobility comments: Flat bed. More assist needed to [facility]  B LE with increase in reports of pain.    Transfers Overall transfer level: Needs assistance Equipment used: Rolling walker (2 wheels) Transfers: Sit to/from Stand, Bed to chair/wheelchair/BSC Sit to Stand: Supervision, Min guard     Step pivot transfers: Supervision, Min guard     General transfer comment: Mild labored movement with pt quickly fatiguing requesting to return to  chair after brief ambulation in the room.     Balance Overall balance assessment: Needs assistance Sitting-balance support: Feet supported, No upper extremity supported Sitting balance-Leahy Scale: Normal Sitting balance - Comments: seated EOB   Standing balance support: During functional activity, No upper extremity supported Standing balance-Leahy Scale: Poor Standing balance comment: fair using RW                           ADL either performed or assessed with clinical judgement   ADL Overall ADL's : Needs assistance/impaired     Grooming: Supervision/safety;Min guard;Standing;Wash/dry hands Grooming Details (indicate cue type and reason): Pt able to stand at the sink using RW leaning on sink to complete hand washing.             Lower Body Dressing: Total assistance;Bed level Lower Body Dressing Details (indicate cue type and reason): Pt assisted in donning socks; pt does not wear socks at baseline.             Functional mobility during ADLs: Supervision/safety;Min guard;Rolling walker (2 wheels) General ADL Comments: Pt able to functionally ambulate to door then stop and stand at sink for grooming prior to sitting in chair due to fatigue.      Cognition Arousal/Alertness: Awake/alert Behavior During Therapy: WFL for tasks assessed/performed Overall Cognitive Status: Within Functional Limits for tasks assessed                                          Exercises Exercises: General Upper Extremity, Shoulder General Exercises - Upper Extremity Shoulder Flexion: AROM, 10 reps, Seated Shoulder ABduction: AROM, 10 reps, Seated Shoulder Horizontal ABduction: AROM, 10 reps, Seated (x10 protraction seated) Shoulder Exercises Shoulder External Rotation: AROM, 10 reps, Seated (and internal rotation)                 Pertinent Vitals/ Pain       Pain Assessment Pain Assessment: Faces Faces Pain Scale: Hurts a little bit Pain  Location: ribs when flat in bed Pain Descriptors / Indicators: Sore Pain Intervention(s): Limited activity within patient's tolerance, Monitored during session, Repositioned                                                          Frequency  Min 2X/week        Progress Toward Goals  OT Goals(current goals can now be found in the care plan section)  Progress towards OT goals: Progressing toward goals  Acute Rehab OT Goals Patient Stated Goal: return home OT Goal Formulation: With patient Time For Goal Achievement: 06/02/21 Potential to Achieve Goals: Good ADL Goals Pt Will Perform Grooming: standing;with modified independence Pt Will Perform Lower Body Bathing: with modified independence;sit to/from stand;sitting/lateral leans Pt Will Perform Lower Body Dressing: with modified independence;sitting/lateral leans;sit  to/from stand Pt Will Transfer to Toilet: with modified independence;ambulating Pt/caregiver will Perform Home Exercise Program: Increased strength;Both right and left upper extremity;Independently  Plan Discharge plan remains appropriate    Co-evaluation    PT/OT/SLP Co-Evaluation/Treatment: Yes Reason for Co-Treatment: To address functional/ADL transfers   OT goals addressed during session: ADL's and self-care                        End of Session Equipment Utilized During Treatment: Rolling walker (2 wheels)  OT Visit Diagnosis: Unsteadiness on feet (R26.81);Other abnormalities of gait and mobility (R26.89);Muscle weakness (generalized) (M62.81)   Activity Tolerance Patient tolerated treatment well   Patient Left in chair;with call bell/phone within reach   Nurse Communication          Time: 0912-0939 OT Time Calculation (min): 27 min  Charges: OT General Charges $OT Visit: 1 Visit OT Treatments $Self Care/Home Management : 8-22 mins  Melitta Tigue OT, MOT  Larey Seat 05/21/2021, 11:27  AM

## 2021-05-21 NOTE — Progress Notes (Signed)
ANTICOAGULATION CONSULT NOTE -   Pharmacy Consult for Coumadin Indication: atrial fibrillation  No Known Allergies  Patient Measurements: Height: 5' (152.4 cm) Weight: 128.7 kg (283 lb 11.7 oz) IBW/kg (Calculated) : 45.5  Vital Signs: Temp: 97.5 F (36.4 C) (02/22 2105) Temp Source: Oral (02/22 2105) BP: 155/77 (02/22 2105) Pulse Rate: 109 (02/22 2105)  Labs: Recent Labs    05/19/21 0448 05/20/21 0504 05/20/21 0905 05/20/21 0953 05/21/21 0524  HGB 12.2 13.1 14.1  --   --   HCT 39.3 41.0 44.4  --   --   PLT 209 237 181  --   --   LABPROT 36.3*  --   --  24.1* 22.4*  INR 3.7*  --   --  2.2* 2.0*  CREATININE  --  0.75  --   --  0.78     Estimated Creatinine Clearance: 73.3 mL/min (by C-G formula based on SCr of 0.78 mg/dL).   Medical History: Past Medical History:  Diagnosis Date   Acute diastolic heart failure (HCC)    Anxiety state, unspecified    Atrial fibrillation (HCC)    Congestive heart failure, unspecified    Degenerative joint disease    GERD (gastroesophageal reflux disease)    Hyperlipidemia    Hypertension    Lymphedema    Obesity    OSA (obstructive sleep apnea) 06/09/2015   Persistent atrial fibrillation (HCC)     post-termination pauses with afib s/p PPM   Presence of permanent cardiac pacemaker    Sinoatrial node dysfunction (HCC)    s/p PPM   Sleep apnea    compliant with CPAP   Unspecified hypothyroidism    Unspecified venous (peripheral) insufficiency     Medications:  Medications Prior to Admission  Medication Sig Dispense Refill Last Dose   albuterol (VENTOLIN HFA) 108 (90 Base) MCG/ACT inhaler INHALE 2 PUFFS BY MOUTH EVERY 6 HOURS AS NEEDED FOR WHEEZING OR SHORTNESS OF BREATH (Patient taking differently: Inhale 2 puffs into the lungs every 6 (six) hours as needed for shortness of breath.) 18 g 0 unknown   atorvastatin (LIPITOR) 80 MG tablet Take 1 tablet by mouth once daily 90 tablet 1 Past Week   clotrimazole-betamethasone  (LOTRISONE) cream APPLY  CREAM TOPICALLY TO AFFECTED AREA TWICE DAILY (Patient taking differently: Apply 1 application topically 2 (two) times daily.) 45 g 0 Past Month   diltiazem (CARDIZEM CD) 240 MG 24 hr capsule Take 1 capsule (240 mg total) by mouth daily. 90 capsule 1 Past Week   esomeprazole (NEXIUM) 40 MG capsule Take 1 capsule (40 mg total) by mouth daily. 90 capsule 1 Past Week   levothyroxine (SYNTHROID) 175 MCG tablet Take 1 tablet (175 mcg total) by mouth daily before breakfast. 90 tablet 1 Past Week   metoprolol tartrate (LOPRESSOR) 100 MG tablet Take 1/2 (one-half) tablet by mouth twice daily (Patient taking differently: Take 50 mg by mouth 2 (two) times daily.) 90 tablet 2 05/16/2021 at am   mometasone (NASONEX) 50 MCG/ACT nasal spray Place 2 sprays into the nose daily. 1 each 12 unknown   Vitamin D, Ergocalciferol, (DRISDOL) 1.25 MG (50000 UNIT) CAPS capsule Take 1 capsule by mouth once a week (Patient taking differently: Take 50,000 Units by mouth every 7 (seven) days.) 12 capsule 1 Past Week   warfarin (COUMADIN) 3 MG tablet TAKE 1 TABLET BY MOUTH ONCE DAILY EXCEPT TAKE 1/2 (ONE-HALF) TABLET ONCE DAILY ON MONDAYS OR AS DIRECTED (Patient taking differently: Take 1.5-3 mg by mouth See  admin instructions. 1 TABLET BY MOUTH ONCE DAILY EXCEPT TAKE 1/2 (ONE-HALF) TABLET ONCE DAILY ON MONDAYS OR AS DIRECTED) 35 tablet 5 05/16/2021 at am   blood glucose meter kit and supplies Dispense based on patient and insurance preference. Use up to four times daily as directed. (FOR ICD-10 E10.9, E11.9). 1 each 0    cephALEXin (KEFLEX) 500 MG capsule Take 1 capsule (500 mg total) by mouth 2 (two) times daily. (Patient not taking: Reported on 05/17/2021) 14 capsule 0 Completed Course   metroNIDAZOLE (METROGEL) 1 % gel Apply topically daily. (Patient not taking: Reported on 05/17/2021) 45 g 0 Completed Course    Assessment: Patient is a 76 yo female with chronic coumadin therapy for afib. Home dose listed as  1.5 on Mon and 3 mg ROW  INR 3.4> 3.7>> 2.2> 2.0  Goal of Therapy:  INR 2.2-2.5 per anticoag clinic Monitor platelets by anticoagulation protocol: Yes   Plan:  Warfarin 3 mg x 1 dose. Daily PT-INR Monitor for S/S of bleeding  Margot Ables, PharmD Clinical Pharmacist 05/21/2021 8:22 AM

## 2021-05-21 NOTE — Assessment & Plan Note (Signed)
Urine culture = E Coli Initially on ceftriaxone D/c with cefdinir x 4 more days

## 2021-05-21 NOTE — Care Management Important Message (Addendum)
Important Message  Patient Details  Name: Megan [name] MRN: [id number] Date of Birth: [birth year]   Medicare Important Message Given:  Yes - Important Message mailed due to current National Emergency  Spoke with son Natalina Wieting at [phone], copy will be mailed to address on file   Tommy Medal 05/21/2021, 12:57 PM

## 2021-05-21 NOTE — Discharge Summary (Signed)
Physician Discharge Summary   Patient: Megan [name] MRN: [id number] DOB: 1945/10/21  Admit date:     05/17/2021  Discharge date: 05/21/21  Discharge Physician: Shanon Brow Lexxus [place]   PCP: Sharion Balloon, FNP   Recommendations at discharge:   Please follow up with primary care provider within 1-2 weeks  Please repeat BMP and CBC in one week      Hospital Course: 76 year old female, with history of morbid obesity, lymphedema, hypertension, atrial fibrillation on anticoagulation, here to the hospital with urinary tract infection and sepsis.  She was also COVID-positive.  Started on antibiotics and IV fluids.  Physical therapy to evaluate.  She lives at home with her son, but her son is often not at home since he is working.  She reports that she can normally ambulate with a walker.  Assessment and Plan: * Sepsis ([place])- (present on admission) On admission: Patient has a temperature of 101.8, heart rate 104, respiratory rate 35, blood pressure as low as 66/57, white blood cell count 19.6, lactic acid 4.2>>improved with IV fluids Sepsis order set utilized UA is indicative of UTI Urine culture--EColi Blood culture neg Chest x-ray shows no evidence of acute disease 3 L bolus in ED Rocephin 2 g started in ED, continue 2 g daily pending final culture data Lactic acid trending down Sepsis physiology resolved D/c with cefdinir x 4 more days  Acute cystitis without hematuria Urine culture = E Coli Initially on ceftriaxone D/c with cefdinir x 4 more days  Hyperglycemia Last A1c from 04/02/2021 was 5.8 Blood sugars have been running during this admission, but she was receiving on IV hydrocortisone Steroids discontinued on 2/21 and blood sugars are now improving  COVID-19 virus infection- (present on admission) Patient declines Paxlovid at this time as she has heard about 2 many side effects on TV No current oxygen requirement, no current wheezing Overall respiratory status currently stable on  RA Continue to monitor  Mixed hyperlipidemia- (present on admission) Continue statin medication  Acquired hypothyroidism- (present on admission) TSH appears to be suppressed at 0.049 Reports that her Synthroid dose was recently increased from 150 to 167mg Would decrease dose back to 1563m and repeat TSH in 4 weeks  Gastroesophageal reflux disease without esophagitis- (present on admission) Continue Protonix  Lymphedema Chronic, currently at baseline  Generalized weakness Seen by PT with recommendations for SNF Patient agreeable to placement  Morbid obesity due to excess calories ([facility] (present on admission) BMI 55.4 Will need lifestyle changes  Tachycardia-bradycardia syndrome ([facility] (present on admission) Status post permanent pacemaker  Permanent atrial fibrillation ([place] metoprolol since and diltiazem blood pressure is now elevated Continue warfarin  HYPERTENSION, BENIGN- (present on admission) Stable on metoprolol Restart diltiazem as blood pressure allows      Consultants: none Procedures performed: none  Disposition: Skilled nursing facility Diet recommendation:  Cardiac diet  DISCHARGE MEDICATION: Allergies as of 05/21/2021   No Known Allergies      Medication List     STOP taking these medications    cephALEXin 500 MG capsule Commonly known as: KEFLEX   metroNIDAZOLE 1 % gel Commonly known as: Metrogel       TAKE these medications    albuterol 108 (90 Base) MCG/ACT inhaler Commonly known as: VENTOLIN HFA INHALE 2 PUFFS BY MOUTH EVERY 6 HOURS AS NEEDED FOR WHEEZING OR SHORTNESS OF BREATH What changed: See the new instructions.   atorvastatin 80 MG tablet Commonly known as: LIPITOR Take 1 tablet by mouth once daily  blood glucose meter kit and supplies Dispense based on patient and insurance preference. Use up to four times daily as directed. (FOR ICD-10 E10.9, E11.9).   cefdinir 300 MG capsule Commonly known as:  OMNICEF Take 1 capsule (300 mg total) by mouth every 12 (twelve) hours. X 4 days   clotrimazole-betamethasone cream Commonly known as: LOTRISONE APPLY  CREAM TOPICALLY TO AFFECTED AREA TWICE DAILY What changed: See the new instructions.   diltiazem 240 MG 24 hr capsule Commonly known as: CARDIZEM CD Take 1 capsule (240 mg total) by mouth daily.   esomeprazole 40 MG capsule Commonly known as: NEXIUM Take 1 capsule (40 mg total) by mouth daily.   levothyroxine 175 MCG tablet Commonly known as: SYNTHROID Take 1 tablet (175 mcg total) by mouth daily before breakfast.   metoprolol tartrate 100 MG tablet Commonly known as: LOPRESSOR Take 1/2 (one-half) tablet by mouth twice daily What changed:  how much to take how to take this when to take this additional instructions   mometasone 50 MCG/ACT nasal spray Commonly known as: Nasonex Place 2 sprays into the nose daily.   Vitamin D (Ergocalciferol) 1.25 MG (50000 UNIT) Caps capsule Commonly known as: DRISDOL Take 1 capsule by mouth once a week What changed: when to take this   warfarin 3 MG tablet Commonly known as: COUMADIN Take as directed. If you are unsure how to take this medication, talk to your nurse or doctor. Original instructions: TAKE 1 TABLET BY MOUTH ONCE DAILY EXCEPT TAKE 1/2 (ONE-HALF) TABLET ONCE DAILY ON MONDAYS OR AS DIRECTED What changed:  how much to take how to take this when to take this additional instructions        Contact information for after-discharge care     [facility] .   Service: Skilled Nursing Contact information: [address] [place] [phone]                     Discharge Exam: Danley Danker Weights   05/17/21 1535 05/17/21 2226  Weight: 127 kg 128.7 kg   HEENT:  [place]/AT, No thrush, no icterus CV:  RRR, no rub, no S3, no S4 Lung:  bibasilar crackles., no wheeze, no rhonchi Abd:  soft/+BS,  NT Ext:  Nonpitting LE edema, no lymphangitis, no synovitis, no rash   Condition at discharge: stable  The results of significant diagnostics from this hospitalization (including imaging, microbiology, ancillary and laboratory) are listed below for reference.   Imaging Studies: DG Chest Port 1 View  Result Date: 05/17/2021 CLINICAL DATA:  Questionable sepsis - evaluate for abnormality EXAM: PORTABLE CHEST 1 VIEW COMPARISON:  January 20, 2021. FINDINGS: No consolidation. No visible pleural effusions or pneumothorax. Cardiomediastinal silhouette is enlarged, likely accentuated by AP technique and low lung volumes. Left dual lead subclavian approach cardiac rhythm maintenance device. Overall, similar appearance of the chest in comparison to the priors. IMPRESSION: No evidence of acute cardiopulmonary disease. Electronically Signed   By: Margaretha Sheffield M.D.   On: 05/17/2021 18:02    Microbiology: Results for orders placed or performed during the hospital encounter of 05/17/21  Blood Culture (routine x 2)     Status: None (Preliminary result)   Collection Time: 05/17/21  5:30 PM   Specimen: BLOOD RIGHT FOREARM  Result Value Ref Range Status   Specimen Description   Final    BLOOD RIGHT FOREARM BOTTLES DRAWN AEROBIC AND ANAEROBIC   Special Requests  Final    Blood Culture results may not be optimal due to an excessive volume of blood received in culture bottles   Culture   Final    NO GROWTH 4 DAYS Performed at [facility], [address] [place], [place] [address]    Report Status PENDING  Incomplete  Blood Culture (routine x 2)     Status: None (Preliminary result)   Collection Time: 05/17/21  5:30 PM   Specimen: BLOOD LEFT ARM  Result Value Ref Range Status   Specimen Description BLOOD LEFT ARM BOTTLES DRAWN AEROBIC AND ANAEROBIC  Final   Special Requests Blood Culture adequate volume  Final   Culture   Final    NO GROWTH 4 DAYS Performed at [facility], [address],  [place], [place] [address]    Report Status PENDING  Incomplete  Resp Panel by RT-PCR (Flu A&B, Covid) Nasopharyngeal Swab     Status: Abnormal   Collection Time: 05/17/21  5:35 PM   Specimen: Nasopharyngeal Swab; Nasopharyngeal(NP) swabs in vial transport medium  Result Value Ref Range Status   SARS Coronavirus 2 by RT PCR POSITIVE (A) NEGATIVE Final    Comment: (NOTE) SARS-CoV-2 target nucleic acids are DETECTED.  The SARS-CoV-2 RNA is generally detectable in upper respiratory specimens during the acute phase of infection. Positive results are indicative of the presence of the identified virus, but do not rule out bacterial infection or co-infection with other pathogens not detected by the test. Clinical correlation with patient history and other diagnostic information is necessary to determine patient infection status. The expected result is Negative.  Fact Sheet for Patients: EntrepreneurPulse.com.au  Fact Sheet for Healthcare Providers: IncredibleEmployment.be  This test is not yet approved or cleared by the [place] FDA and  has been authorized for detection and/or diagnosis of SARS-CoV-2 by FDA under an Emergency Use Authorization (EUA).  This EUA will remain in effect (meaning this test can be used) for the duration of  the COVID-19 declaration under Section 564(b)(1) of the A ct, 21 U.S.C. section 360bbb-3(b)(1), unless the authorization is terminated or revoked sooner.     Influenza A by PCR NEGATIVE NEGATIVE Final   Influenza B by PCR NEGATIVE NEGATIVE Final    Comment: (NOTE) The Xpert Xpress SARS-CoV-2/FLU/RSV plus assay is intended as an aid in the diagnosis of influenza from Nasopharyngeal swab specimens and should not be used as a sole basis for treatment. Nasal washings and aspirates are unacceptable for Xpert Xpress SARS-CoV-2/FLU/RSV testing.  Fact Sheet for Patients: EntrepreneurPulse.com.au  Fact  Sheet for Healthcare Providers: IncredibleEmployment.be  This test is not yet approved or cleared by the [place] FDA and has been authorized for detection and/or diagnosis of SARS-CoV-2 by FDA under an Emergency Use Authorization (EUA). This EUA will remain in effect (meaning this test can be used) for the duration of the COVID-19 declaration under Section 564(b)(1) of the Act, 21 U.S.C. section 360bbb-3(b)(1), unless the authorization is terminated or revoked.  Performed at [facility], [address], [place], [place] [address]   Urine Culture     Status: Abnormal   Collection Time: 05/17/21  6:22 PM   Specimen: In/Out Cath Urine  Result Value Ref Range Status   Specimen Description   Final    IN/OUT CATH URINE Performed at [facility], [address], [place], [place] [address]    Special Requests   Final    NONE Performed at [facility], [address] [place], [place] [address]  Culture 10,000 COLONIES/mL ESCHERICHIA COLI (A)  Final   Report Status 05/21/2021 FINAL  Final   Organism ID, Bacteria ESCHERICHIA COLI (A)  Final      Susceptibility   Escherichia coli - MIC*    AMPICILLIN >=32 RESISTANT Resistant     CEFAZOLIN <=4 SENSITIVE Sensitive     CEFEPIME <=0.12 SENSITIVE Sensitive     CEFTRIAXONE <=0.25 SENSITIVE Sensitive     CIPROFLOXACIN <=0.25 SENSITIVE Sensitive     GENTAMICIN <=1 SENSITIVE Sensitive     IMIPENEM <=0.25 SENSITIVE Sensitive     NITROFURANTOIN <=16 SENSITIVE Sensitive     TRIMETH/SULFA >=320 RESISTANT Resistant     AMPICILLIN/SULBACTAM >=32 RESISTANT Resistant     PIP/TAZO <=4 SENSITIVE Sensitive     * 10,000 COLONIES/mL ESCHERICHIA COLI    Labs: CBC: Recent Labs  Lab 05/17/21 1626 05/18/21 0502 05/19/21 0448 05/20/21 0504 05/20/21 0905  WBC 19.6* 9.5 11.4* 10.9* 11.1*  NEUTROABS  --  8.8*  --   --   --   HGB 16.7* 12.7 12.2 13.1 14.1  HCT 53.5* 39.6 39.3 41.0 44.4  MCV 99.8 97.5 98.5 99.0 102.3*  PLT 252  199 209 237 696   Basic Metabolic Panel: Recent Labs  Lab 05/17/21 1626 05/18/21 0502 05/20/21 0504 05/21/21 0524  NA 134* 137 139 139  K 4.8 4.0 3.2* 3.3*  CL 98 106 105 104  CO2 21* 21* 27 27  GLUCOSE 219* 198* 91 91  BUN _0 CREATININE 1.08* 0.75 0.75 0.78  CALCIUM 9.1 8.0* 8.1* 8.1*  MG  --  1.8  --   --    Liver Function Tests: Recent Labs  Lab 05/17/21 1733 05/18/21 0502  AST 27 18  ALT 25 21  ALKPHOS 109 88  BILITOT 1.0 0.9  PROT 6.9 5.8*  ALBUMIN 3.1* 2.6*   CBG: Recent Labs  Lab 05/20/21 1149 05/20/21 1653 05/20/21 2107 05/21/21 0734 05/21/21 1131  GLUCAP 101* 98 128* 87 118*    Discharge time spent: greater than 30 minutes.  Signed: Orson Eva, MD [facility] 05/21/2021

## 2021-05-21 NOTE — TOC Transition Note (Signed)
Transition of Care [facility]) - CM/SW Discharge Note   Patient Details  Name: Megan [name] MRN: [id number] Date of Birth: Nov 02, 1945  [facility] CM/SW Contact:  Ihor Gully, LCSW Phone Number: 05/21/2021, 1:11 PM   Clinical Narrative:    Discharge clinicals sent to facility. Son notified. [facility] contacted. RN to call report. TOC Signing off.    Final next level of care: Skilled Nursing Facility Barriers to Discharge: No Barriers Identified   Patient Goals and CMS Choice Patient states their goals for this hospitalization and ongoing recovery are:: Patient is unsure as to whether she wants SNF or HHPT   Choice offered to / list presented to : Patient  Discharge Placement              Patient chooses bed at: Other - please specify in the comment section below: ([facility]) Patient to be transferred to facility by: [facility] Name of family member notified: son Patient and family notified of of transfer: 05/21/21  Discharge Plan and Services                                     Social Determinants of Health (SDOH) Interventions     Readmission Risk Interventions No flowsheet data found.

## 2021-05-21 NOTE — Progress Notes (Signed)
Patient left via EMS. No distress noted.

## 2021-05-21 NOTE — Progress Notes (Signed)
Physical Therapy Treatment Patient Details Name: Megan [name] MRN: [id number] DOB: [birth year] Today's Date: 05/21/2021   History of Present Illness Megan [name] is a 76 y.o. female with medical history significant of with history of acute diastolic heart failure, atrial fibrillation, lipidemia, hypertension, OSA but does not wear CPAP, hypothyroidism, previous insufficiency, and more presents to the ED with a chief complaint of so weak that she could not walk.  Patient reports that she normally ambulates at home with a cane, but has not been able to do so over the last 2-3 days.  Patient reports that since she has felt weak and been afraid that she would fall she has not gotten up at all.  ED provider reported that she was saturated in urine upon arrival.  Patient does live with her son at home.  She reports that he still been able to get food to her.  Her appetite has been slightly decreased, for example on the day of presentation all she had was half of a bacon egg biscuit from [employer].  Patient denies any symptoms of fever at home.  She does have a productive cough with clear/yellow sputum.  The sputum causes her to be nauseous.  Patient denies chest pain, abdominal pain.  Patient denies shortness of breath or wheezing.  In January patient was started on Keflex for UTI.  At the end of January she was prescribed prednisone, which she reports is for bronchitis.  The prednisone caused her glucose to go up to 200, so patient stopped taking it.  She reports when she became so weak the past couple of days she tried prednisone to see if would make her stronger, but it did not.  Patient reports that she has diet-controlled diabetes.  Patient reports that she has had dysuria but no hematuria.    PT Comments    Patient required frequent verbal/tactile cueing and Mod assist for sitting up at bedside with fair carryover for propping up on elbows to hands secondary to generalized weakness, fair/good  return for completing BLE ROM/strengthening exercises while seated at bedside, slow slightly labored cadence during gait training without loss of balance and limited for ambulation mostly due to c/o fatigue.  Patient tolerated sitting up in chair to continue working with OT after therapy.  Patient will benefit from continued skilled physical therapy in hospital and recommended venue below to increase strength, balance, endurance for safe ADLs and gait.     Recommendations for follow up therapy are one component of a multi-disciplinary discharge planning process, led by the attending physician.  Recommendations may be updated based on patient status, additional functional criteria and insurance authorization.  Follow Up Recommendations  Skilled nursing-short term rehab (<3 hours/day)     Assistance Recommended at Discharge Set up Supervision/Assistance  Patient can return home with the following A little help with walking and/or transfers;A little help with bathing/dressing/bathroom;Help with stairs or ramp for entrance;Assistance with cooking/housework   Equipment Recommendations  None recommended by PT    Recommendations for Other Services       Precautions / Restrictions Precautions Precautions: Fall Restrictions Weight Bearing Restrictions: No     Mobility  Bed Mobility Overal bed mobility: Needs Assistance Bed Mobility: Supine to Sit     Supine to sit: Min assist, Mod assist     General bed mobility comments: Flat bed. More assist needed to moblize B LE with increase in reports of pain.    Transfers Overall transfer level: Needs assistance Equipment used:  Rolling walker (2 wheels) Transfers: Sit to/from Stand, Bed to chair/wheelchair/BSC Sit to Stand: Supervision, Min guard   Step pivot transfers: Supervision, Min guard       General transfer comment: slow labored movement    Ambulation/Gait Ambulation/Gait assistance: Min guard, Min assist Gait Distance (Feet):  25 Feet Assistive device: Rolling walker (2 wheels) Gait Pattern/deviations: Decreased step length - right, Decreased step length - left, Decreased stride length Gait velocity: decreased     General Gait Details: slow labored cadence without loss of balance, limited mostly due to c/o fatigue and swelling BLE   Stairs             Wheelchair Mobility    Modified Rankin (Stroke Patients Only)       Balance Overall balance assessment: Needs assistance Sitting-balance support: Feet supported, No upper extremity supported Sitting balance-Leahy Scale: Good Sitting balance - Comments: seated EOB   Standing balance support: During functional activity, Bilateral upper extremity supported Standing balance-Leahy Scale: Fair Standing balance comment: fair using RW                            Cognition Arousal/Alertness: Awake/alert Behavior During Therapy: WFL for tasks assessed/performed Overall Cognitive Status: Within Functional Limits for tasks assessed                                          Exercises General Exercises - Lower Extremity Long Arc Quad: Seated, AROM, Strengthening, Both, 10 reps Hip Flexion/Marching: Seated, AROM, Strengthening, Both, 10 reps Toe Raises: Seated, AROM, Strengthening, Both, 10 reps Heel Raises: Seated, AROM, Strengthening, Both, 10 reps    General Comments        Pertinent Vitals/Pain Pain Assessment Pain Assessment: Faces Faces Pain Scale: Hurts a little bit Pain Location: ribs when flat in bed Pain Descriptors / Indicators: Sore Pain Intervention(s): Limited activity within patient's tolerance, Monitored during session, Repositioned    Home Living                          Prior Function            PT Goals (current goals can now be found in the care plan section) Acute Rehab PT Goals Patient Stated Goal: return home with family to assist PT Goal Formulation: With patient Time For Goal  Achievement: 05/28/21 Potential to Achieve Goals: Good Progress towards PT goals: Progressing toward goals    Frequency    Min 3X/week      PT Plan Current plan remains appropriate    Co-evaluation PT/OT/SLP Co-Evaluation/Treatment: Yes Reason for Co-Treatment: To address functional/ADL transfers PT goals addressed during session: Mobility/safety with mobility;Balance;Proper use of DME;Strengthening/ROM OT goals addressed during session: ADL's and self-care      AM-PAC PT "6 Clicks" Mobility   Outcome Measure  Help needed turning from your back to your side while in a flat bed without using bedrails?: None Help needed moving from lying on your back to sitting on the side of a flat bed without using bedrails?: A Little Help needed moving to and from a bed to a chair (including a wheelchair)?: A Little Help needed standing up from a chair using your arms (e.g., wheelchair or bedside chair)?: A Little Help needed to walk in hospital room?: A Little Help needed climbing 3-5 steps with  a railing? : A Lot 6 Click Score: 18    End of Session   Activity Tolerance: Patient tolerated treatment well;Patient limited by fatigue Patient left: in chair;with call bell/phone within reach Nurse Communication: Mobility status PT Visit Diagnosis: Unsteadiness on feet (R26.81);Other abnormalities of gait and mobility (R26.89);Muscle weakness (generalized) (M62.81)     Time: 5732-2567 PT Time Calculation (min) (ACUTE ONLY): 20 min  Charges:  $Gait Training: 8-22 mins $Therapeutic Exercise: 8-22 mins                     12:15 PM, 05/21/21 Lonell Grandchild, MPT Physical Therapist with [facility] [phone] office [phone] mobile phone

## 2021-05-22 LAB — CULTURE, BLOOD (ROUTINE X 2)
Culture: NO GROWTH
Culture: NO GROWTH
Special Requests: ADEQUATE

## 2021-05-25 DIAGNOSIS — E785 Hyperlipidemia, unspecified: Secondary | ICD-10-CM | POA: Diagnosis not present

## 2021-05-25 DIAGNOSIS — A419 Sepsis, unspecified organism: Secondary | ICD-10-CM | POA: Diagnosis not present

## 2021-05-25 DIAGNOSIS — K219 Gastro-esophageal reflux disease without esophagitis: Secondary | ICD-10-CM | POA: Diagnosis not present

## 2021-05-25 DIAGNOSIS — N390 Urinary tract infection, site not specified: Secondary | ICD-10-CM | POA: Diagnosis not present

## 2021-05-25 DIAGNOSIS — I495 Sick sinus syndrome: Secondary | ICD-10-CM | POA: Diagnosis not present

## 2021-05-25 DIAGNOSIS — I890 Lymphedema, not elsewhere classified: Secondary | ICD-10-CM | POA: Diagnosis not present

## 2021-05-25 DIAGNOSIS — E039 Hypothyroidism, unspecified: Secondary | ICD-10-CM | POA: Diagnosis not present

## 2021-05-25 DIAGNOSIS — U071 COVID-19: Secondary | ICD-10-CM | POA: Diagnosis not present

## 2021-05-28 DIAGNOSIS — I10 Essential (primary) hypertension: Secondary | ICD-10-CM | POA: Diagnosis not present

## 2021-05-28 DIAGNOSIS — E785 Hyperlipidemia, unspecified: Secondary | ICD-10-CM | POA: Diagnosis not present

## 2021-05-28 DIAGNOSIS — I890 Lymphedema, not elsewhere classified: Secondary | ICD-10-CM | POA: Diagnosis not present

## 2021-05-28 DIAGNOSIS — I4891 Unspecified atrial fibrillation: Secondary | ICD-10-CM | POA: Diagnosis not present

## 2021-05-28 DIAGNOSIS — E559 Vitamin D deficiency, unspecified: Secondary | ICD-10-CM | POA: Diagnosis not present

## 2021-05-28 DIAGNOSIS — I495 Sick sinus syndrome: Secondary | ICD-10-CM | POA: Diagnosis not present

## 2021-05-28 DIAGNOSIS — K219 Gastro-esophageal reflux disease without esophagitis: Secondary | ICD-10-CM | POA: Diagnosis not present

## 2021-05-28 DIAGNOSIS — E039 Hypothyroidism, unspecified: Secondary | ICD-10-CM | POA: Diagnosis not present

## 2021-06-01 DIAGNOSIS — E039 Hypothyroidism, unspecified: Secondary | ICD-10-CM | POA: Diagnosis not present

## 2021-06-01 DIAGNOSIS — K219 Gastro-esophageal reflux disease without esophagitis: Secondary | ICD-10-CM | POA: Diagnosis not present

## 2021-06-01 DIAGNOSIS — E785 Hyperlipidemia, unspecified: Secondary | ICD-10-CM | POA: Diagnosis not present

## 2021-06-01 DIAGNOSIS — E559 Vitamin D deficiency, unspecified: Secondary | ICD-10-CM | POA: Diagnosis not present

## 2021-06-01 DIAGNOSIS — E441 Mild protein-calorie malnutrition: Secondary | ICD-10-CM | POA: Diagnosis not present

## 2021-06-01 DIAGNOSIS — I10 Essential (primary) hypertension: Secondary | ICD-10-CM | POA: Diagnosis not present

## 2021-06-05 DIAGNOSIS — M25562 Pain in left knee: Secondary | ICD-10-CM | POA: Diagnosis not present

## 2021-06-05 DIAGNOSIS — Z7901 Long term (current) use of anticoagulants: Secondary | ICD-10-CM | POA: Diagnosis not present

## 2021-06-05 DIAGNOSIS — Z5181 Encounter for therapeutic drug level monitoring: Secondary | ICD-10-CM | POA: Diagnosis not present

## 2021-06-08 DIAGNOSIS — M79605 Pain in left leg: Secondary | ICD-10-CM | POA: Diagnosis not present

## 2021-06-08 DIAGNOSIS — M25562 Pain in left knee: Secondary | ICD-10-CM | POA: Diagnosis not present

## 2021-06-09 DIAGNOSIS — M25562 Pain in left knee: Secondary | ICD-10-CM | POA: Diagnosis not present

## 2021-06-09 DIAGNOSIS — M79662 Pain in left lower leg: Secondary | ICD-10-CM | POA: Diagnosis not present

## 2021-06-12 ENCOUNTER — Encounter (HOSPITAL_COMMUNITY): Payer: Self-pay | Admitting: Emergency Medicine

## 2021-06-12 ENCOUNTER — Emergency Department (HOSPITAL_COMMUNITY): Payer: Medicare Other

## 2021-06-12 ENCOUNTER — Other Ambulatory Visit: Payer: Self-pay

## 2021-06-12 ENCOUNTER — Observation Stay (HOSPITAL_COMMUNITY)
Admission: EM | Admit: 2021-06-12 | Discharge: 2021-06-13 | Disposition: A | Discharge status: Skilled Nursing Facility | Payer: Medicare Other | Attending: Family Medicine | Admitting: Family Medicine

## 2021-06-12 DIAGNOSIS — I509 Heart failure, unspecified: Secondary | ICD-10-CM | POA: Diagnosis not present

## 2021-06-12 DIAGNOSIS — J9811 Atelectasis: Secondary | ICD-10-CM | POA: Diagnosis not present

## 2021-06-12 DIAGNOSIS — I4821 Permanent atrial fibrillation: Secondary | ICD-10-CM | POA: Diagnosis present

## 2021-06-12 DIAGNOSIS — R531 Weakness: Secondary | ICD-10-CM

## 2021-06-12 DIAGNOSIS — E782 Mixed hyperlipidemia: Secondary | ICD-10-CM | POA: Diagnosis present

## 2021-06-12 DIAGNOSIS — K8020 Calculus of gallbladder without cholecystitis without obstruction: Secondary | ICD-10-CM | POA: Diagnosis not present

## 2021-06-12 DIAGNOSIS — Z8616 Personal history of COVID-19: Secondary | ICD-10-CM | POA: Diagnosis not present

## 2021-06-12 DIAGNOSIS — J441 Chronic obstructive pulmonary disease with (acute) exacerbation: Secondary | ICD-10-CM | POA: Insufficient documentation

## 2021-06-12 DIAGNOSIS — Z950 Presence of cardiac pacemaker: Secondary | ICD-10-CM | POA: Diagnosis present

## 2021-06-12 DIAGNOSIS — I10 Essential (primary) hypertension: Secondary | ICD-10-CM | POA: Diagnosis present

## 2021-06-12 DIAGNOSIS — E039 Hypothyroidism, unspecified: Secondary | ICD-10-CM | POA: Diagnosis not present

## 2021-06-12 DIAGNOSIS — K219 Gastro-esophageal reflux disease without esophagitis: Secondary | ICD-10-CM | POA: Diagnosis present

## 2021-06-12 DIAGNOSIS — I110 Hypertensive heart disease with heart failure: Secondary | ICD-10-CM | POA: Diagnosis not present

## 2021-06-12 DIAGNOSIS — M899 Disorder of bone, unspecified: Secondary | ICD-10-CM | POA: Diagnosis present

## 2021-06-12 DIAGNOSIS — I5032 Chronic diastolic (congestive) heart failure: Secondary | ICD-10-CM | POA: Insufficient documentation

## 2021-06-12 DIAGNOSIS — I4891 Unspecified atrial fibrillation: Secondary | ICD-10-CM | POA: Insufficient documentation

## 2021-06-12 DIAGNOSIS — J209 Acute bronchitis, unspecified: Secondary | ICD-10-CM | POA: Diagnosis not present

## 2021-06-12 DIAGNOSIS — J219 Acute bronchiolitis, unspecified: Secondary | ICD-10-CM | POA: Diagnosis not present

## 2021-06-12 DIAGNOSIS — Z87891 Personal history of nicotine dependence: Secondary | ICD-10-CM | POA: Insufficient documentation

## 2021-06-12 DIAGNOSIS — I739 Peripheral vascular disease, unspecified: Secondary | ICD-10-CM | POA: Diagnosis present

## 2021-06-12 DIAGNOSIS — I890 Lymphedema, not elsewhere classified: Secondary | ICD-10-CM

## 2021-06-12 DIAGNOSIS — Z66 Do not resuscitate: Secondary | ICD-10-CM | POA: Diagnosis present

## 2021-06-12 DIAGNOSIS — R0601 Orthopnea: Secondary | ICD-10-CM | POA: Diagnosis present

## 2021-06-12 DIAGNOSIS — R0602 Shortness of breath: Secondary | ICD-10-CM | POA: Diagnosis not present

## 2021-06-12 DIAGNOSIS — Z79899 Other long term (current) drug therapy: Secondary | ICD-10-CM | POA: Diagnosis not present

## 2021-06-12 DIAGNOSIS — Z7901 Long term (current) use of anticoagulants: Secondary | ICD-10-CM | POA: Diagnosis not present

## 2021-06-12 DIAGNOSIS — I495 Sick sinus syndrome: Secondary | ICD-10-CM | POA: Diagnosis present

## 2021-06-12 DIAGNOSIS — R059 Cough, unspecified: Secondary | ICD-10-CM | POA: Diagnosis not present

## 2021-06-12 DIAGNOSIS — Z20822 Contact with and (suspected) exposure to covid-19: Secondary | ICD-10-CM | POA: Insufficient documentation

## 2021-06-12 LAB — RESP PANEL BY RT-PCR (FLU A&B, COVID) ARPGX2
Influenza A by PCR: NEGATIVE
Influenza B by PCR: NEGATIVE
SARS Coronavirus 2 by RT PCR: NEGATIVE

## 2021-06-12 LAB — COMPREHENSIVE METABOLIC PANEL
ALT: 26 U/L (ref 0–44)
AST: 23 U/L (ref 15–41)
Albumin: 3.4 g/dL — ABNORMAL LOW (ref 3.5–5.0)
Alkaline Phosphatase: 125 U/L (ref 38–126)
Anion gap: 8 (ref 5–15)
BUN: 16 mg/dL (ref 8–23)
CO2: 27 mmol/L (ref 22–32)
Calcium: 8.6 mg/dL — ABNORMAL LOW (ref 8.9–10.3)
Chloride: 101 mmol/L (ref 98–111)
Creatinine, Ser: 0.79 mg/dL (ref 0.44–1.00)
GFR, Estimated: >60 mL/min (ref >60)
Glucose, Bld: 140 mg/dL — ABNORMAL HIGH (ref 70–99)
Potassium: 4.4 mmol/L (ref 3.5–5.1)
Sodium: 136 mmol/L (ref 135–145)
Total Bilirubin: 1.4 mg/dL — ABNORMAL HIGH (ref 0.3–1.2)
Total Protein: 7 g/dL (ref 6.5–8.1)

## 2021-06-12 LAB — CBC WITH DIFFERENTIAL/PLATELET
Abs Immature Granulocytes: 0.03 10*3/uL (ref 0.00–0.07)
Basophils Absolute: 0 10*3/uL (ref 0.0–0.1)
Basophils Relative: 0 %
Eosinophils Absolute: 0 10*3/uL (ref 0.0–0.5)
Eosinophils Relative: 0 %
HCT: 43.1 % (ref 36.0–46.0)
Hemoglobin: 14 g/dL (ref 12.0–15.0)
Immature Granulocytes: 0 %
Lymphocytes Relative: 8 %
Lymphs Abs: 0.7 10*3/uL (ref 0.7–4.0)
MCH: 32.4 pg (ref 26.0–34.0)
MCHC: 32.5 g/dL (ref 30.0–36.0)
MCV: 99.8 fL (ref 80.0–100.0)
Monocytes Absolute: 1 10*3/uL (ref 0.1–1.0)
Monocytes Relative: 10 %
Neutro Abs: 7.7 10*3/uL (ref 1.7–7.7)
Neutrophils Relative %: 82 %
Platelets: 232 10*3/uL (ref 150–400)
RBC: 4.32 MIL/uL (ref 3.87–5.11)
RDW: 15.1 % (ref 11.5–15.5)
WBC: 9.5 10*3/uL (ref 4.0–10.5)
nRBC: 0 % (ref 0.0–0.2)

## 2021-06-12 LAB — CBG MONITORING, ED: Glucose-Capillary: 189 mg/dL — ABNORMAL HIGH (ref 70–99)

## 2021-06-12 LAB — PROTIME-INR
INR: 1.7 — ABNORMAL HIGH (ref 0.8–1.2)
Prothrombin Time: 19.9 seconds — ABNORMAL HIGH (ref 11.4–15.2)

## 2021-06-12 LAB — BRAIN NATRIURETIC PEPTIDE: B Natriuretic Peptide: 202 pg/mL — ABNORMAL HIGH (ref 0.0–100.0)

## 2021-06-12 MED ORDER — BISACODYL 5 MG PO TBEC: 5.0000 mg | DELAYED_RELEASE_TABLET | Freq: Every day | ORAL | Status: DC | PRN

## 2021-06-12 MED ORDER — ONDANSETRON HCL 4 MG PO TABS: 4.0000 mg | ORAL_TABLET | Freq: Four times a day (QID) | ORAL | Status: DC | PRN

## 2021-06-12 MED ORDER — PANTOPRAZOLE SODIUM 40 MG PO TBEC
40.0000 mg | DELAYED_RELEASE_TABLET | Freq: Every day | ORAL | Status: DC
  Administered 2021-06-12 – 2021-06-13 (×2): 40 mg via ORAL
  Filled 2021-06-12 (×2): qty 1

## 2021-06-12 MED ORDER — MAGNESIUM SULFATE 2 GM/50ML IV SOLN
2.0000 g | Freq: Once | INTRAVENOUS | Status: AC
  Administered 2021-06-12: 2 g via INTRAVENOUS
  Filled 2021-06-12: qty 50

## 2021-06-12 MED ORDER — POTASSIUM CHLORIDE CRYS ER 20 MEQ PO TBCR
40.0000 meq | EXTENDED_RELEASE_TABLET | Freq: Once | ORAL | Status: AC
Start: 2021-06-12 — End: 2021-06-12
  Administered 2021-06-12: 40 meq via ORAL
  Filled 2021-06-12: qty 2

## 2021-06-12 MED ORDER — IOHEXOL 350 MG/ML SOLN
100.0000 mL | Freq: Once | INTRAVENOUS | Status: AC | PRN
  Administered 2021-06-12: 100 mL via INTRAVENOUS

## 2021-06-12 MED ORDER — INSULIN ASPART 100 UNIT/ML IJ SOLN: 2.0000 [IU] | Freq: Three times a day (TID) | INTRAMUSCULAR | Status: DC

## 2021-06-12 MED ORDER — FUROSEMIDE 10 MG/ML IJ SOLN
20.0000 mg | Freq: Once | INTRAMUSCULAR | Status: AC
  Administered 2021-06-12: 20 mg via INTRAVENOUS
  Filled 2021-06-12: qty 2

## 2021-06-12 MED ORDER — ATORVASTATIN CALCIUM 40 MG PO TABS
80.0000 mg | ORAL_TABLET | Freq: Every evening | ORAL | Status: DC
  Administered 2021-06-12: 80 mg via ORAL
  Filled 2021-06-12: qty 2

## 2021-06-12 MED ORDER — FUROSEMIDE 40 MG PO TABS
40.0000 mg | ORAL_TABLET | Freq: Every day | ORAL | Status: DC
  Administered 2021-06-13: 40 mg via ORAL
  Filled 2021-06-12: qty 1

## 2021-06-12 MED ORDER — DOXYCYCLINE HYCLATE 100 MG PO TABS
100.0000 mg | ORAL_TABLET | Freq: Two times a day (BID) | ORAL | Status: DC
  Administered 2021-06-12 – 2021-06-13 (×2): 100 mg via ORAL
  Filled 2021-06-12 (×2): qty 1

## 2021-06-12 MED ORDER — WARFARIN - PHARMACIST DOSING INPATIENT: Freq: Every day | Status: DC

## 2021-06-12 MED ORDER — ONDANSETRON HCL 4 MG/2ML IJ SOLN: 4.0000 mg | Freq: Four times a day (QID) | INTRAMUSCULAR | Status: DC | PRN

## 2021-06-12 MED ORDER — DOXYCYCLINE HYCLATE 100 MG PO TABS
100.0000 mg | ORAL_TABLET | Freq: Once | ORAL | Status: AC
  Administered 2021-06-12: 100 mg via ORAL
  Filled 2021-06-12: qty 1

## 2021-06-12 MED ORDER — DEXTROMETHORPHAN POLISTIREX ER 30 MG/5ML PO SUER
30.0000 mg | Freq: Two times a day (BID) | ORAL | Status: DC
  Administered 2021-06-12 – 2021-06-13 (×3): 30 mg via ORAL
  Filled 2021-06-12 (×9): qty 5

## 2021-06-12 MED ORDER — LEVOTHYROXINE SODIUM 75 MCG PO TABS
175.0000 ug | ORAL_TABLET | Freq: Every day | ORAL | Status: DC
  Administered 2021-06-13: 175 ug via ORAL
  Filled 2021-06-12: qty 1

## 2021-06-12 MED ORDER — DILTIAZEM HCL ER COATED BEADS 240 MG PO CP24
240.0000 mg | ORAL_CAPSULE | Freq: Every day | ORAL | Status: DC
  Administered 2021-06-12 – 2021-06-13 (×2): 240 mg via ORAL
  Filled 2021-06-12 (×2): qty 1

## 2021-06-12 MED ORDER — OXYCODONE HCL 5 MG PO TABS: 5.0000 mg | ORAL_TABLET | Freq: Four times a day (QID) | ORAL | Status: DC | PRN

## 2021-06-12 MED ORDER — INSULIN ASPART 100 UNIT/ML IJ SOLN
0.0000 [IU] | Freq: Three times a day (TID) | INTRAMUSCULAR | Status: DC
  Administered 2021-06-12 – 2021-06-13 (×3): 3 [IU] via SUBCUTANEOUS
  Filled 2021-06-12: qty 1

## 2021-06-12 MED ORDER — WARFARIN SODIUM 1 MG PO TABS
3.0000 mg | ORAL_TABLET | Freq: Once | ORAL | Status: AC
  Administered 2021-06-12: 3 mg via ORAL
  Filled 2021-06-12: qty 1

## 2021-06-12 MED ORDER — ALBUTEROL SULFATE (2.5 MG/3ML) 0.083% IN NEBU: 3.0000 mL | INHALATION_SOLUTION | Freq: Four times a day (QID) | RESPIRATORY_TRACT | Status: DC | PRN

## 2021-06-12 MED ORDER — METHYLPREDNISOLONE SODIUM SUCC 125 MG IJ SOLR
125.0000 mg | Freq: Once | INTRAMUSCULAR | Status: AC
  Administered 2021-06-12: 125 mg via INTRAVENOUS
  Filled 2021-06-12: qty 2

## 2021-06-12 MED ORDER — METOPROLOL TARTRATE 50 MG PO TABS
100.0000 mg | ORAL_TABLET | Freq: Two times a day (BID) | ORAL | Status: DC
  Administered 2021-06-12 – 2021-06-13 (×2): 100 mg via ORAL
  Filled 2021-06-12 (×2): qty 2

## 2021-06-12 MED ORDER — ACETAMINOPHEN 325 MG PO TABS: 650.0000 mg | ORAL_TABLET | Freq: Four times a day (QID) | ORAL | Status: DC | PRN

## 2021-06-12 MED ORDER — POTASSIUM CHLORIDE CRYS ER 20 MEQ PO TBCR
30.0000 meq | EXTENDED_RELEASE_TABLET | Freq: Every day | ORAL | Status: DC
  Administered 2021-06-13: 30 meq via ORAL
  Filled 2021-06-12: qty 1

## 2021-06-12 MED ORDER — IPRATROPIUM-ALBUTEROL 0.5-2.5 (3) MG/3ML IN SOLN
3.0000 mL | Freq: Once | RESPIRATORY_TRACT | Status: AC
  Administered 2021-06-12: 3 mL via RESPIRATORY_TRACT
  Filled 2021-06-12: qty 3

## 2021-06-12 MED ORDER — GUAIFENESIN ER 600 MG PO TB12
1200.0000 mg | ORAL_TABLET | Freq: Two times a day (BID) | ORAL | Status: DC
  Administered 2021-06-12 – 2021-06-13 (×3): 1200 mg via ORAL
  Filled 2021-06-12 (×3): qty 2

## 2021-06-12 MED ORDER — PREDNISONE 20 MG PO TABS
30.0000 mg | ORAL_TABLET | Freq: Every day | ORAL | Status: DC
Start: 2021-06-13 — End: 2021-06-13
  Administered 2021-06-13: 30 mg via ORAL
  Filled 2021-06-12: qty 1

## 2021-06-12 MED ORDER — ACETAMINOPHEN 650 MG RE SUPP
650.0000 mg | Freq: Four times a day (QID) | RECTAL | Status: DC | PRN
Start: 2021-06-12 — End: 2021-06-13

## 2021-06-12 MED ORDER — FLUTICASONE PROPIONATE 50 MCG/ACT NA SUSP: 2.0000 | Freq: Every day | NASAL | Status: DC | PRN

## 2021-06-12 MED ORDER — WARFARIN SODIUM 3 MG PO TABS
3.0000 mg | ORAL_TABLET | Freq: Once | ORAL | Status: DC
  Filled 2021-06-12: qty 1

## 2021-06-12 MED ORDER — IPRATROPIUM-ALBUTEROL 0.5-2.5 (3) MG/3ML IN SOLN
3.0000 mL | Freq: Four times a day (QID) | RESPIRATORY_TRACT | Status: DC
  Administered 2021-06-12 – 2021-06-13 (×4): 3 mL via RESPIRATORY_TRACT
  Filled 2021-06-12 (×4): qty 3

## 2021-06-12 MED ORDER — ALBUTEROL SULFATE (2.5 MG/3ML) 0.083% IN NEBU
2.5000 mg | INHALATION_SOLUTION | Freq: Once | RESPIRATORY_TRACT | Status: AC
  Administered 2021-06-12: 2.5 mg via RESPIRATORY_TRACT
  Filled 2021-06-12: qty 3

## 2021-06-12 NOTE — Assessment & Plan Note (Signed)
- 

## 2021-06-12 NOTE — Assessment & Plan Note (Addendum)
Stable, well controlled.

## 2021-06-12 NOTE — H&P (Addendum)
?History and Physical  ?[facility] ? ?Megan [name] MRN:[id number] DOB: 08/12/1945 DOA: 06/12/2021 ? ?PCP: Hawks, Christy A, FNP  ?Patient coming from: SNF ?Level of care: Med-Surg ? ?I have personally briefly reviewed patient's old medical records in [facility] ? ?Chief Complaint: shortness of breath  ? ?HPI: Megan [name] is a 76-year-old female with mild COPD from a 10-year smoking history, morbid obesity with chronic lymphedema in the extremities, hypertension, chronic atrial fibrillation on full anticoagulation with warfarin, recently discharged last month from COVID pneumonia and urinary tract infection to [facility].  For the past 5 days she has had increasing cough chest congestion wheezing and productive cough.  No fever reported.  No chest pain reported.  She has had a new oxygen requirement and is now on 2 L nasal cannula.  She was sent to the emergency department for further evaluation.  Seen in the ED and sent for CT chest with no findings of PE or pneumonia but found to have acute on chronic bronchitis.  She was treated in the ED unfortunately her symptoms did not improve enough for her to be able to discharge back to SNF. ? ?Questionable lytic foci within T7 and T4 vertebral bodies, T7 lesion ?demonstrating a defined sclerotic rim, nonspecific; consider ?follow-up radionuclide bone scan to exclude metastatic disease. ? ?Review of Systems: Review of Systems  ?Constitutional:  Positive for malaise/fatigue.  ?HENT: Negative.    ?Eyes: Negative.   ?Respiratory:  Positive for cough, sputum production, shortness of breath and wheezing.   ?Cardiovascular:  Positive for leg swelling and PND. Negative for chest pain.  ?Gastrointestinal:  Positive for heartburn. Negative for abdominal pain, constipation, diarrhea, melena and vomiting.  ?Genitourinary: Negative.   ?Musculoskeletal: Negative.   ?Skin:  Negative for itching and rash.  ?Neurological: Negative.   ?Endo/Heme/Allergies: Negative.    ?Psychiatric/Behavioral: Negative.    ?  ?Past Medical History:  ?Diagnosis Date  ? Acute diastolic heart failure (HCC)   ? Anxiety state, unspecified   ? Atrial fibrillation (HCC)   ? Congestive heart failure, unspecified   ? Degenerative joint disease   ? GERD (gastroesophageal reflux disease)   ? Hyperlipidemia   ? Hypertension   ? Lymphedema   ? Obesity   ? OSA (obstructive sleep apnea) 06/09/2015  ? Persistent atrial fibrillation (HCC)   ?  post-termination pauses with afib s/p PPM  ? Presence of permanent cardiac pacemaker   ? Sinoatrial node dysfunction (HCC)   ? s/p PPM  ? Sleep apnea   ? compliant with CPAP  ? Unspecified hypothyroidism   ? Unspecified venous (peripheral) insufficiency   ? ? ?Past Surgical History:  ?Procedure Laterality Date  ? CATARACT EXTRACTION W/PHACO Left 09/09/2014  ? Procedure: CATARACT EXTRACTION PHACO AND INTRAOCULAR LENS PLACEMENT (IOC);  Surgeon: Kerry Hunt, MD;  Location: [facility];  Service: Ophthalmology;  Laterality: Left;  CDE: 6.99  ? CATARACT EXTRACTION W/PHACO Right 10/07/2014  ? Procedure: CATARACT EXTRACTION PHACO AND INTRAOCULAR LENS PLACEMENT RIGHT EYE;  Surgeon: Kerry Hunt, MD;  Location: [facility];  Service: Ophthalmology;  Laterality: Right;  CDE:5.16  ? PACEMAKER INSERTION    ? SJM by JA  ? ? ? reports that she quit smoking about 43 years ago. Her smoking use included cigarettes. She has a 5.00 pack-year smoking history. She has never used smokeless tobacco. She reports that she does not drink alcohol and does not use drugs. ? ?No Known Allergies ? ?Family History  ?Problem Relation Age   of Onset  ? Parkinson's disease Other   ? CVA Other   ? Parkinson's disease Father   ? ? ?Prior to Admission medications   ?Medication Sig Start Date End Date Taking? Authorizing Provider  ?albuterol (VENTOLIN HFA) 108 (90 Base) MCG/ACT inhaler INHALE 2 PUFFS BY MOUTH EVERY 6 HOURS AS NEEDED FOR WHEEZING OR SHORTNESS OF BREATH ?Patient taking differently: Inhale 2 puffs into the lungs  every 6 (six) hours as needed for shortness of breath. 01/01/21  Yes Sharion Balloon, FNP  ?atorvastatin (LIPITOR) 80 MG tablet Take 1 tablet by mouth once daily ?Patient taking differently: Take 80 mg by mouth daily. 03/20/21  Yes Sharion Balloon, FNP  ?clotrimazole-betamethasone (LOTRISONE) cream APPLY  CREAM TOPICALLY TO AFFECTED AREA TWICE DAILY ?Patient taking differently: Apply 1 application. topically 2 (two) times daily. 03/06/21  Yes Sharion Balloon, FNP  ?diclofenac Sodium (VOLTAREN) 1 % GEL Apply 4 g topically in the morning, at noon, and at bedtime.   Yes [provider]  ?diltiazem (CARDIZEM CD) 240 MG 24 hr capsule Take 1 capsule (240 mg total) by mouth daily. 01/26/21  Yes Sharion Balloon, FNP  ?esomeprazole (NEXIUM) 40 MG capsule Take 1 capsule (40 mg total) by mouth daily. 12/18/20  Yes Sharion Balloon, FNP  ?levothyroxine (SYNTHROID) 175 MCG tablet Take 1 tablet (175 mcg total) by mouth daily before breakfast. 04/03/21  Yes Sharion Balloon, FNP  ?metoprolol tartrate (LOPRESSOR) 100 MG tablet Take 1/2 (one-half) tablet by mouth twice daily ?Patient taking differently: Take 100 mg by mouth 2 (two) times daily. 02/12/20  Yes Sharion Balloon, FNP  ?mometasone (NASONEX) 50 MCG/ACT nasal spray Place 2 sprays into the nose daily. 12/18/20  Yes Sharion Balloon, FNP  ?Vitamin D, Ergocalciferol, (DRISDOL) 1.25 MG (50000 UNIT) CAPS capsule Take 1 capsule by mouth once a week ?Patient taking differently: Take 50,000 Units by mouth every 7 (seven) days. 01/01/21  Yes Sharion Balloon, FNP  ?warfarin (COUMADIN) 3 MG tablet TAKE 1 TABLET BY MOUTH ONCE DAILY EXCEPT TAKE 1/2 (ONE-HALF) TABLET ONCE DAILY ON MONDAYS OR AS DIRECTED ?Patient taking differently: Take 6 mg by mouth at bedtime. 03/09/21  Yes AllredJeneen Rinks, MD  ?blood glucose meter kit and supplies Dispense based on patient and insurance preference. Use up to four times daily as directed. (FOR ICD-10 E10.9, E11.9). 06/09/20   Sharion Balloon, FNP   ?cefdinir (OMNICEF) 300 MG capsule Take 1 capsule (300 mg total) by mouth every 12 (twelve) hours. X 4 days ?Patient not taking: Reported on 06/12/2021 05/21/21   Orson Eva, MD  ? ? ?Physical Exam: ?Vitals:  ? 06/12/21 1100 06/12/21 1430 06/12/21 1500 06/12/21 1507  ?BP: 139/73 134/67 138/73   ?Pulse: 95 72    ?Resp: 20     ?Temp:      ?SpO2: 97% 100%  97%  ?Weight:      ?Height:      ? ? ?Constitutional: obese lying supine in bed, NAD, calm, comfortable ?Eyes: PERRL, lids and conjunctivae normal ?ENMT: Mucous membranes are moist. Posterior pharynx clear of any exudate or lesions.Normal dentition.  ?Neck: normal, supple, no masses, no thyromegaly ?Respiratory: diffuse insp/exp wheezing heard. Rare crackles heard.  Normal respiratory effort. No accessory muscle use.  ?Cardiovascular: normal s1, s2 sounds, no murmurs / rubs / gallops. No extremity edema. 2+ pedal pulses. No carotid bruits.  ?Abdomen: no tenderness, no masses palpated. No hepatosplenomegaly. Bowel sounds positive.  ?Musculoskeletal: chronic lymphedema, no clubbing /  cyanosis. No joint deformity upper and lower extremities. Good ROM, no contractures. Normal muscle tone.  ?Skin: no rashes, lesions, ulcers. No induration ?Neurologic: CN 2-12 grossly intact. Sensation intact, DTR normal. Strength 5/5 in all 4.  ?Psychiatric: Normal judgment and insight. Alert and oriented x 3. Normal mood.  ? ?Labs on Admission: I have personally reviewed following labs and imaging studies ? ?CBC: ?Recent Labs  ?Lab 06/12/21 ?0746  ?WBC 9.5  ?NEUTROABS 7.7  ?HGB 14.0  ?HCT 43.1  ?MCV 99.8  ?PLT 232  ? ?Basic Metabolic Panel: ?Recent Labs  ?Lab 06/12/21 ?0746  ?NA 136  ?K 4.4  ?CL 101  ?CO2 27  ?GLUCOSE 140*  ?BUN 16  ?CREATININE 0.79  ?CALCIUM 8.6*  ? ?GFR: ?Estimated Creatinine Clearance: 71.7 mL/min (by C-G formula based on SCr of 0.79 mg/dL). ?Liver Function Tests: ?Recent Labs  ?Lab 06/12/21 ?0746  ?AST 23  ?ALT 26  ?ALKPHOS 125  ?BILITOT 1.4*  ?PROT 7.0  ?ALBUMIN 3.4*   ? ?No results for input(s): LIPASE, AMYLASE in the last 168 hours. ?No results for input(s): AMMONIA in the last 168 hours. ?Coagulation Profile: ?Recent Labs  ?Lab 06/12/21 ?0746  ?INR 1.7*  ? ?Car

## 2021-06-12 NOTE — Assessment & Plan Note (Signed)
Continue lasix as ordered.  ?

## 2021-06-12 NOTE — ED Provider Notes (Signed)
?[facility] ?Provider Note ? ? ?CSN: [id number] ?Arrival date & time: 06/12/21  3818 ? ?  ? ?History ? ?No chief complaint on file. ? ? ?Megan [name] is a 76 y.o. female.  She is brought in by EMS from her facility for evaluation of cough shortness of breath decreased p.o. intake.  She has been sick for about 4 days.  Cough productive of yellow sputum.  Wheezing.  Denies any fever chest pain abdominal pain.  She was recently admitted with sepsis and COVID.  She has been working with therapy and starting to walk again.  Denies tobacco use. ? ?The history is provided by the patient and the EMS personnel.  ?Shortness of Breath ?Severity:  Moderate ?Onset quality:  Gradual ?Duration:  4 days ?Timing:  Constant ?Progression:  Unchanged ?Chronicity:  Recurrent ?Relieved by:  Nothing ?Worsened by:  Activity and coughing ?Ineffective treatments:  Inhaler ?Associated symptoms: cough, sputum production and wheezing   ?Associated symptoms: no abdominal pain, no chest pain, no fever, no headaches, no hemoptysis, no rash and no sore throat   ?Risk factors: no tobacco use   ? ?  ? ?Home Medications ?Prior to Admission medications   ?Medication Sig Start Date End Date Taking? Authorizing Provider  ?albuterol (VENTOLIN HFA) 108 (90 Base) MCG/ACT inhaler INHALE 2 PUFFS BY MOUTH EVERY 6 HOURS AS NEEDED FOR WHEEZING OR SHORTNESS OF BREATH ?Patient taking differently: Inhale 2 puffs into the lungs every 6 (six) hours as needed for shortness of breath. 01/01/21   Sharion Balloon, FNP  ?atorvastatin (LIPITOR) 80 MG tablet Take 1 tablet by mouth once daily 03/20/21   Sharion Balloon, FNP  ?blood glucose meter kit and supplies Dispense based on patient and insurance preference. Use up to four times daily as directed. (FOR ICD-10 E10.9, E11.9). 06/09/20   Sharion Balloon, FNP  ?cefdinir (OMNICEF) 300 MG capsule Take 1 capsule (300 mg total) by mouth every 12 (twelve) hours. X 4 days 05/21/21   Orson Eva, MD   ?clotrimazole-betamethasone (LOTRISONE) cream APPLY  CREAM TOPICALLY TO AFFECTED AREA TWICE DAILY ?Patient taking differently: Apply 1 application topically 2 (two) times daily. 03/06/21   Sharion Balloon, FNP  ?diltiazem (CARDIZEM CD) 240 MG 24 hr capsule Take 1 capsule (240 mg total) by mouth daily. 01/26/21   Sharion Balloon, FNP  ?esomeprazole (NEXIUM) 40 MG capsule Take 1 capsule (40 mg total) by mouth daily. 12/18/20   Sharion Balloon, FNP  ?levothyroxine (SYNTHROID) 175 MCG tablet Take 1 tablet (175 mcg total) by mouth daily before breakfast. 04/03/21   Sharion Balloon, FNP  ?metoprolol tartrate (LOPRESSOR) 100 MG tablet Take 1/2 (one-half) tablet by mouth twice daily ?Patient taking differently: Take 50 mg by mouth 2 (two) times daily. 02/12/20   Sharion Balloon, FNP  ?mometasone (NASONEX) 50 MCG/ACT nasal spray Place 2 sprays into the nose daily. 12/18/20   Sharion Balloon, FNP  ?Vitamin D, Ergocalciferol, (DRISDOL) 1.25 MG (50000 UNIT) CAPS capsule Take 1 capsule by mouth once a week ?Patient taking differently: Take 50,000 Units by mouth every 7 (seven) days. 01/01/21   Sharion Balloon, FNP  ?warfarin (COUMADIN) 3 MG tablet TAKE 1 TABLET BY MOUTH ONCE DAILY EXCEPT TAKE 1/2 (ONE-HALF) TABLET ONCE DAILY ON MONDAYS OR AS DIRECTED ?Patient taking differently: Take 1.5-3 mg by mouth See admin instructions. 1 TABLET BY MOUTH ONCE DAILY EXCEPT TAKE 1/2 (ONE-HALF) TABLET ONCE DAILY ON MONDAYS OR AS DIRECTED 03/09/21  Thompson Grayer, MD  ?   ? ?Allergies    ?Patient has no known allergies.   ? ?Review of Systems   ?Review of Systems  ?Constitutional:  Negative for fever.  ?HENT:  Negative for sore throat.   ?Eyes:  Negative for visual disturbance.  ?Respiratory:  Positive for cough, sputum production, shortness of breath and wheezing. Negative for hemoptysis.   ?Cardiovascular:  Negative for chest pain.  ?Gastrointestinal:  Negative for abdominal pain.  ?Genitourinary:  Negative for dysuria.  ?Skin:  Negative  for rash.  ?Neurological:  Negative for headaches.  ? ?Physical Exam ?Updated Vital Signs ?BP (!) 125/48 (BP Location: Right Arm)   Pulse 90   Temp 98 ?F (36.7 ?C)   Resp (!) 25   SpO2 95% Comment: 2L ?Physical Exam ?Vitals and nursing note reviewed.  ?Constitutional:   ?   General: She is not in acute distress. ?   Appearance: Normal appearance. She is well-developed. She is obese.  ?HENT:  ?   Head: Normocephalic and atraumatic.  ?Eyes:  ?   Conjunctiva/sclera: Conjunctivae normal.  ?Cardiovascular:  ?   Rate and Rhythm: Normal rate and regular rhythm.  ?   Heart sounds: No murmur heard. ?Pulmonary:  ?   Effort: Tachypnea and accessory muscle usage present. No respiratory distress.  ?   Breath sounds: Wheezing present.  ?Abdominal:  ?   Palpations: Abdomen is soft.  ?   Tenderness: There is no abdominal tenderness. There is no guarding or rebound.  ?Musculoskeletal:     ?   General: No signs of injury.  ?   Cervical back: Neck supple.  ?Skin: ?   General: Skin is warm and dry.  ?   Capillary Refill: Capillary refill takes less than 2 seconds.  ?Neurological:  ?   General: No focal deficit present.  ?   Mental Status: She is alert.  ? ? ?ED Results / Procedures / Treatments   ?Labs ?(all labs ordered are listed, but only abnormal results are displayed) ?Labs Reviewed  ?BRAIN NATRIURETIC PEPTIDE - Abnormal; Notable for the following components:  ?    Result Value  ? B Natriuretic Peptide 202.0 (*)   ? All other components within normal limits  ?COMPREHENSIVE METABOLIC PANEL - Abnormal; Notable for the following components:  ? Glucose, Bld 140 (*)   ? Calcium 8.6 (*)   ? Albumin 3.4 (*)   ? Total Bilirubin 1.4 (*)   ? All other components within normal limits  ?PROTIME-INR - Abnormal; Notable for the following components:  ? Prothrombin Time 19.9 (*)   ? INR 1.7 (*)   ? All other components within normal limits  ?CBG MONITORING, ED - Abnormal; Notable for the following components:  ? Glucose-Capillary 189 (*)   ?  All other components within normal limits  ?RESP PANEL BY RT-PCR (FLU A&B, COVID) [id number]  ?CULTURE, BLOOD (ROUTINE X 2)  ?CBC WITH DIFFERENTIAL/PLATELET  ?BASIC METABOLIC PANEL  ?MAGNESIUM  ?PROTIME-INR  ? ? ?EKG ?None ? ?Radiology ?CT Angio Chest PE W/Cm &/Or Wo Cm ? ?Result Date: 06/12/2021 ?CLINICAL DATA:  Cough and shortness of breath for 4 days, former smoker, chronic bronchitis, hypertension, CHF, atrial fibrillation, pacemaker EXAM: CT ANGIOGRAPHY CHEST WITH CONTRAST TECHNIQUE: Multidetector CT imaging of the chest was performed using the standard protocol during bolus administration of intravenous contrast. Multiplanar CT image reconstructions and MIPs were obtained to evaluate the vascular anatomy. RADIATION DOSE REDUCTION: This exam was performed according to the departmental  dose-optimization program which includes automated exposure control, adjustment of the mA and/or kV according to patient size and/or use of iterative reconstruction technique. CONTRAST:  184m OMNIPAQUE IOHEXOL 350 MG/ML SOLN IV COMPARISON:  04/16/2020 FINDINGS: Cardiovascular: Atherosclerotic calcifications aorta and proximal great vessels. Aorta normal caliber without aneurysm or dissection. Pacemaker leads RIGHT atrium and RIGHT ventricle. Minimal pericardial effusion. Enlarged central pulmonary arteries. Pulmonary arteries well opacified and patent. No pulmonary emboli identified. Mediastinum/Nodes: Esophagus normal appearance. Enlarged infrahilar lymph node RIGHT 17 mm short axis image 42, previously 16 mm. Multiple additional normal sized mediastinal and BILATERAL hilar lymph nodes. Base of cervical region normal appearance. Lungs/Pleura: Dependent atelectasis in BILATERAL lower lobes with associated peribronchial thickening. No acute infiltrate, pleural effusion, or pneumothorax. Upper Abdomen: Dependent gallstones in gallbladder. Remaining visualized upper abdomen unremarkable Musculoskeletal: No acute osseous findings. Lytic  lesion with mildly sclerotic rim within T7 vertebral body, new, nonspecific. Probable vertebral hemangioma T9. Question lucent focus at superior T4 on RIGHT. Review of the MIP images confirms the above findings. IM

## 2021-06-12 NOTE — Progress Notes (Addendum)
ANTICOAGULATION CONSULT NOTE - Initial Consult ? ?Pharmacy Consult for warfarin ?Indication: atrial fibrillation ? ?No Known Allergies ? ?Patient Measurements: ?Height: 5' (152.4 cm) ?Weight: 124.4 kg (274 lb 4.8 oz) ?IBW/kg (Calculated) : 45.5 ?Heparin Dosing Weight:  ? ?Vital Signs: ?Temp: 98 ?F (36.7 ?C) (03/17 0734) ?BP: 139/73 (03/17 1100) ?Pulse Rate: 95 (03/17 1100) ? ?Labs: ?Recent Labs  ?  06/12/21 ?0746  ?HGB 14.0  ?HCT 43.1  ?PLT 232  ?LABPROT 19.9*  ?INR 1.7*  ?CREATININE 0.79  ? ? ?Estimated Creatinine Clearance: 71.7 mL/min (by C-G formula based on SCr of 0.79 mg/dL). ? ? ?Medical History: ?Past Medical History:  ?Diagnosis Date  ? Acute diastolic heart failure ([place])   ? Anxiety state, unspecified   ? Atrial fibrillation ([place])   ? Congestive heart failure, unspecified   ? Degenerative joint disease   ? GERD (gastroesophageal reflux disease)   ? Hyperlipidemia   ? Hypertension   ? Lymphedema   ? Obesity   ? OSA (obstructive sleep apnea) 06/09/2015  ? Persistent atrial fibrillation ([place])   ?  post-termination pauses with afib s/p PPM  ? Presence of permanent cardiac pacemaker   ? Sinoatrial node dysfunction (HCC)   ? s/p PPM  ? Sleep apnea   ? compliant with CPAP  ? Unspecified hypothyroidism   ? Unspecified venous (peripheral) insufficiency   ? ? ?Medications:  ?(Not in a hospital admission)  ? ?Assessment: ?Pharmacy consulted to dose warfarin in patient with atrial fibrillation.  Patient's home dose listed as 1.5 every Monday and 3 mg ROW per last anticoag note on filed 04/23/21. MAR still pending from facility.  According to current med rec, patient's child says she takes 6 mg daily. INR on admission is 1.7. ? ? ?Goal of Therapy:  ?INR 2.2-2.5 per anticoag note from January ?Monitor platelets by anticoagulation protocol: Yes ?  ?Plan:  ?Warfarin 3 mg x 1 dose. ?F/U MAR when available for recent dosing. ?Monitor daily INR and s/s of bleeding ? ?Margot Ables, PharmD ?Clinical Pharmacist ?06/12/2021 3:11  PM ? ? ? ?

## 2021-06-12 NOTE — Assessment & Plan Note (Signed)
Currently stable home medications. ?

## 2021-06-12 NOTE — Hospital Course (Addendum)
76 year old female with mild COPD from a 10-year smoking history, morbid obesity with chronic lymphedema in the extremities, hypertension, chronic atrial fibrillation on full anticoagulation with warfarin, recently discharged last month from COVID pneumonia and urinary tract infection to [facility].  For the past 5 days she has had increasing cough chest congestion wheezing and productive cough.  No fever reported.  No chest pain reported.  She has had a new oxygen requirement and is now on 2 L nasal cannula.  She was sent to the emergency department for further evaluation.  Seen in the ED and sent for CT chest with no findings of PE or pneumonia but found to have acute on chronic bronchitis.  She was treated in the ED unfortunately her symptoms did not improve enough for her to be able to discharge back to SNF. ? ?Questionable lytic foci within T7 and T4 vertebral bodies, T7 lesion ?demonstrating a defined sclerotic rim, nonspecific; consider ?follow-up radionuclide bone scan to exclude metastatic disease. ? ?06/13/2021: I discussed with patient that she will need to follow up with her PCP to have a bone scan done. She said that she would follow up and do that.  She is feeling much better.  She is stable to discharge back to SNF today.   ?

## 2021-06-12 NOTE — Assessment & Plan Note (Signed)
Mild pulmonary edema and mild BNP elevation ?Agree with lasix/potassium.  ? ?

## 2021-06-12 NOTE — Assessment & Plan Note (Addendum)
Rate controlled, and fully anticoagulated with warfarin. ?Consulted pharm D for warfarin dosing in hospital.  ?Being on doxycycline we reduced to 3 mg daily.  Please check PT/INR in 3 days and adjust warfarin dose as needed for goal INR 2-3.  ?

## 2021-06-12 NOTE — Assessment & Plan Note (Signed)
Check daily PT/INR on warfarin per pharm D ?

## 2021-06-12 NOTE — Assessment & Plan Note (Addendum)
Questionable lytic foci within T7 and T4 vertebral bodies, T7 lesion demonstrating a defined sclerotic rim, nonspecific; consider follow-up radionuclide bone scan to exclude metastatic disease. ? ?I discussed with patient to follow up with her PCP to have an outpatient bone scan done (not an inpatient procedure), she verbalized understanding and agreed to follow up.   ? ?

## 2021-06-12 NOTE — Assessment & Plan Note (Signed)
Resume home thyroid supplement.  ?

## 2021-06-12 NOTE — Assessment & Plan Note (Signed)
Stable

## 2021-06-12 NOTE — ED Triage Notes (Signed)
Pt arrived via [facility] from [facility] with c/o cough and SOB x 4 days. Hx of chronic bronchitis. 93-94% on room air  ?

## 2021-06-12 NOTE — Assessment & Plan Note (Addendum)
Pt presented with new oxygen requirement, wheezing and coughing.  ?Mild COPD exacerbation related to weather changes.  ?Pt feeling better after being treated with steroids, doxycycline, bronchodilators, mucinex.  ?Pt will discharge on oxygen 2L/min.   ?

## 2021-06-12 NOTE — Assessment & Plan Note (Signed)
Resume home lipid lowering medication ?

## 2021-06-12 NOTE — ED Notes (Signed)
Spoke with pt's son, Lamont Glasscock, per pt request on update about where she is at and what is going on.  ?

## 2021-06-12 NOTE — Assessment & Plan Note (Addendum)
Protonix ordered for GI protection in hospital.  Resume home PPI at discharge.  ?

## 2021-06-12 NOTE — Assessment & Plan Note (Signed)
Chronic, plan return to SNF at discharge.  ?

## 2021-06-13 DIAGNOSIS — Z7401 Bed confinement status: Secondary | ICD-10-CM | POA: Diagnosis not present

## 2021-06-13 DIAGNOSIS — R5381 Other malaise: Secondary | ICD-10-CM | POA: Diagnosis not present

## 2021-06-13 DIAGNOSIS — K219 Gastro-esophageal reflux disease without esophagitis: Secondary | ICD-10-CM | POA: Diagnosis not present

## 2021-06-13 DIAGNOSIS — E039 Hypothyroidism, unspecified: Secondary | ICD-10-CM | POA: Diagnosis not present

## 2021-06-13 DIAGNOSIS — I10 Essential (primary) hypertension: Secondary | ICD-10-CM | POA: Diagnosis not present

## 2021-06-13 DIAGNOSIS — I5032 Chronic diastolic (congestive) heart failure: Secondary | ICD-10-CM | POA: Diagnosis not present

## 2021-06-13 DIAGNOSIS — J219 Acute bronchiolitis, unspecified: Secondary | ICD-10-CM | POA: Diagnosis not present

## 2021-06-13 DIAGNOSIS — Z950 Presence of cardiac pacemaker: Secondary | ICD-10-CM | POA: Diagnosis not present

## 2021-06-13 DIAGNOSIS — J209 Acute bronchitis, unspecified: Secondary | ICD-10-CM | POA: Diagnosis not present

## 2021-06-13 DIAGNOSIS — R531 Weakness: Secondary | ICD-10-CM | POA: Diagnosis not present

## 2021-06-13 DIAGNOSIS — Z66 Do not resuscitate: Secondary | ICD-10-CM | POA: Diagnosis not present

## 2021-06-13 LAB — PROTIME-INR
INR: 2.2 — ABNORMAL HIGH (ref 0.8–1.2)
Prothrombin Time: 24.4 seconds — ABNORMAL HIGH (ref 11.4–15.2)

## 2021-06-13 LAB — BASIC METABOLIC PANEL
Anion gap: 13 (ref 5–15)
BUN: 14 mg/dL (ref 8–23)
CO2: 24 mmol/L (ref 22–32)
Calcium: 8.9 mg/dL (ref 8.9–10.3)
Chloride: 100 mmol/L (ref 98–111)
Creatinine, Ser: 0.66 mg/dL (ref 0.44–1.00)
GFR, Estimated: >60 mL/min (ref >60)
Glucose, Bld: 165 mg/dL — ABNORMAL HIGH (ref 70–99)
Potassium: 4.9 mmol/L (ref 3.5–5.1)
Sodium: 137 mmol/L (ref 135–145)

## 2021-06-13 LAB — GLUCOSE, CAPILLARY
Glucose-Capillary: 152 mg/dL — ABNORMAL HIGH (ref 70–99)
Glucose-Capillary: 192 mg/dL — ABNORMAL HIGH (ref 70–99)
Glucose-Capillary: 151 mg/dL — ABNORMAL HIGH (ref 70–99)

## 2021-06-13 LAB — MAGNESIUM: Magnesium: 2.3 mg/dL (ref 1.7–2.4)

## 2021-06-13 MED ORDER — DOXYCYCLINE HYCLATE 100 MG PO TABS: 100.0000 mg | ORAL_TABLET | Freq: Two times a day (BID) | ORAL | 0 refills | Status: AC

## 2021-06-13 MED ORDER — DEXTROMETHORPHAN POLISTIREX ER 30 MG/5ML PO SUER: 30.0000 mg | Freq: Two times a day (BID) | ORAL | 0 refills | Status: AC

## 2021-06-13 MED ORDER — PREDNISONE 10 MG PO TABS: 20.0000 mg | ORAL_TABLET | Freq: Every day | ORAL | 0 refills | Status: AC

## 2021-06-13 MED ORDER — IPRATROPIUM-ALBUTEROL 0.5-2.5 (3) MG/3ML IN SOLN: 3.0000 mL | Freq: Two times a day (BID) | RESPIRATORY_TRACT | Status: DC

## 2021-06-13 MED ORDER — GUAIFENESIN ER 600 MG PO TB12: 1200.0000 mg | ORAL_TABLET | Freq: Two times a day (BID) | ORAL | 0 refills | Status: AC

## 2021-06-13 MED ORDER — WARFARIN SODIUM 3 MG PO TABS: ORAL_TABLET | ORAL | 5 refills | Status: DC

## 2021-06-13 MED ORDER — POTASSIUM CHLORIDE CRYS ER 10 MEQ PO TBCR
10.0000 meq | EXTENDED_RELEASE_TABLET | Freq: Every day | ORAL | 0 refills | Status: DC
Start: 2021-06-14 — End: 2021-10-21

## 2021-06-13 MED ORDER — WARFARIN SODIUM 2 MG PO TABS: 2.0000 mg | ORAL_TABLET | Freq: Once | ORAL | Status: DC

## 2021-06-13 MED ORDER — FUROSEMIDE 40 MG PO TABS: 20.0000 mg | ORAL_TABLET | Freq: Every day | ORAL | Status: DC

## 2021-06-13 MED ORDER — METOPROLOL TARTRATE 100 MG PO TABS: 100.0000 mg | ORAL_TABLET | Freq: Two times a day (BID) | ORAL | Status: DC

## 2021-06-13 MED ORDER — GUAIFENESIN-DM 100-10 MG/5ML PO SYRP: 5.0000 mL | ORAL_SOLUTION | ORAL | Status: DC | PRN

## 2021-06-13 NOTE — TOC Transition Note (Signed)
Transition of Care (TOC) - CM/SW Discharge Note ? ? ?Patient Details  ?Name: Megan [name] ?MRN: [id number] ?Date of Birth: [birth year] ? ?Transition of Care (TOC) CM/SW Contact:  ?Boneta Lucks, RN ?Phone Number: ?06/13/2021, 11:21 AM ? ? ?Clinical Narrative:   Patient discharging back to [facility]. MD ordering 2 L oxygen, updated Debbie. Room B4-1 Given. EMS scheduled. Left son to message to update him with DC plan.  ? ?Final next level of care: [place] ?Barriers to Discharge: Barriers Resolved ? ? ?Patient Goals and CMS Choice ?Patient states their goals for this hospitalization and ongoing recovery are:: to return to SNF. ?CMS Medicare.gov Compare Post Acute Care list provided to:: Patient ?  ? ?Discharge Placement ?  ?          ?  ?Patient to be transferred to facility by: EMS ?  ?Patient and family notified of of transfer: 06/13/21 ? ?Discharge Plan and Services ?  ?  ?           ?DME Arranged: [place] - ordered 2L ?  ?  ?   ? ? ?

## 2021-06-13 NOTE — Progress Notes (Addendum)
ANTICOAGULATION CONSULT NOTE -  ? ?Pharmacy Consult for warfarin ?Indication: atrial fibrillation ? ?No Known Allergies ? ?Patient Measurements: ?Height: 5' (152.4 cm) ?Weight: 124.4 kg (274 lb 4.8 oz) ?IBW/kg (Calculated) : 45.5 ?Heparin Dosing Weight:  ? ?Vital Signs: ?Temp: 97.9 ?F (36.6 ?C) (03/17 2207) ?Temp Source: Oral (03/17 2207) ?BP: 148/60 (03/18 0306) ?Pulse Rate: 84 (03/18 0306) ? ?Labs: ?Recent Labs  ?  06/12/21 ?0746 06/13/21 ?3220  ?HGB 14.0  --   ?HCT 43.1  --   ?PLT 232  --   ?LABPROT 19.9* 24.4*  ?INR 1.7* 2.2*  ?CREATININE 0.79 0.66  ? ? ? ?Estimated Creatinine Clearance: 71.7 mL/min (by C-G formula based on SCr of 0.66 mg/dL). ? ? ?Medical History: ?Past Medical History:  ?Diagnosis Date  ? Acute diastolic heart failure ([place])   ? Anxiety state, unspecified   ? Atrial fibrillation ([place])   ? Congestive heart failure, unspecified   ? Degenerative joint disease   ? GERD (gastroesophageal reflux disease)   ? Hyperlipidemia   ? Hypertension   ? Lymphedema   ? Obesity   ? OSA (obstructive sleep apnea) 06/09/2015  ? Persistent atrial fibrillation ([place])   ?  post-termination pauses with afib s/p PPM  ? Presence of permanent cardiac pacemaker   ? Sinoatrial node dysfunction (HCC)   ? s/p PPM  ? Sleep apnea   ? compliant with CPAP  ? Unspecified hypothyroidism   ? Unspecified venous (peripheral) insufficiency   ? ? ?Medications:  ?Medications Prior to Admission  ?Medication Sig Dispense Refill Last Dose  ? albuterol (VENTOLIN HFA) 108 (90 Base) MCG/ACT inhaler INHALE 2 PUFFS BY MOUTH EVERY 6 HOURS AS NEEDED FOR WHEEZING OR SHORTNESS OF BREATH (Patient taking differently: Inhale 2 puffs into the lungs every 6 (six) hours as needed for shortness of breath.) 18 g 0   ? atorvastatin (LIPITOR) 80 MG tablet Take 1 tablet by mouth once daily (Patient taking differently: Take 80 mg by mouth daily.) 90 tablet 1   ? clotrimazole-betamethasone (LOTRISONE) cream APPLY  CREAM TOPICALLY TO AFFECTED AREA TWICE DAILY  (Patient taking differently: Apply 1 application. topically 2 (two) times daily.) 45 g 0   ? diclofenac Sodium (VOLTAREN) 1 % GEL Apply 4 g topically in the morning, at noon, and at bedtime.     ? diltiazem (CARDIZEM CD) 240 MG 24 hr capsule Take 1 capsule (240 mg total) by mouth daily. 90 capsule 1   ? esomeprazole (NEXIUM) 40 MG capsule Take 1 capsule (40 mg total) by mouth daily. 90 capsule 1   ? levothyroxine (SYNTHROID) 175 MCG tablet Take 1 tablet (175 mcg total) by mouth daily before breakfast. 90 tablet 1   ? metoprolol tartrate (LOPRESSOR) 100 MG tablet Take 1/2 (one-half) tablet by mouth twice daily (Patient taking differently: Take 100 mg by mouth 2 (two) times daily.) 90 tablet 2   ? mometasone (NASONEX) 50 MCG/ACT nasal spray Place 2 sprays into the nose daily. 1 each 12   ? Vitamin D, Ergocalciferol, (DRISDOL) 1.25 MG (50000 UNIT) CAPS capsule Take 1 capsule by mouth once a week (Patient taking differently: Take 50,000 Units by mouth every 7 (seven) days.) 12 capsule 1   ? warfarin (COUMADIN) 3 MG tablet TAKE 1 TABLET BY MOUTH ONCE DAILY EXCEPT TAKE 1/2 (ONE-HALF) TABLET ONCE DAILY ON MONDAYS OR AS DIRECTED (Patient taking differently: Take 6 mg by mouth at bedtime.) 35 tablet 5   ? blood glucose meter kit and supplies Dispense based on  patient and insurance preference. Use up to four times daily as directed. (FOR ICD-10 E10.9, E11.9). 1 each 0   ? ? ?Assessment: ?Pharmacy consulted to dose warfarin in patient with atrial fibrillation.  Patient's home dose listed as 1.5 every Monday and 3 mg ROW per last anticoag note on filed 04/23/21. MAR still pending from facility.  According to current med rec, patient's child says she takes 6 mg daily. ? ?INR 1.7 > 2.2 ? ? ?Goal of Therapy:  ?INR 2.2-2.5 per anticoag note from January ?Monitor platelets by anticoagulation protocol: Yes ?  ?Plan:  ?Warfarin 2 mg x 1 dose. ?F/U MAR when available for recent dosing. ?Monitor daily INR and s/s of bleeding ? ?Margot Ables, PharmD ?Clinical Pharmacist ?06/13/2021 8:51 AM ? ? ? ?

## 2021-06-13 NOTE — Discharge Summary (Addendum)
Physician Discharge Summary  ?Megan [name] KJZ:[id number] DOB: 1945-08-18 DOA: 06/12/2021 ? ?PCP: Sharion Balloon, FNP ?Cardiologist: Dr. Rayann Heman  ? ?Admit date: 06/12/2021 ?Discharge date: 06/13/2021 ? ?Admitted From:  SNF  ?Disposition:  SNF  ? ?Recommendations for Outpatient Follow-up:  ?Follow up with PCP in 2 weeks ?Follow up with cardiology in 2-3 weeks  ?Please check PT/INR in 2 days and adjust warfarin dose as needed for goal INR 2-3.  ?Please obtain outpatient bone scan to follow up T7, T4 lytic lesions seen on CT scan ?Please check CBG at least 2 times per day and treat as needed ?Please provide air mattress and skin care protocol to avoid further sacral pressure injury.  ? ?Discharge Condition: STABLE   ?CODE STATUS: DNR   ?DIET: low sodium, carb modified recommended ? ?Brief Hospitalization Summary: ?Please see all hospital notes, images, labs for full details of the hospitalization. ?76 year old female with mild COPD from a 10-year smoking history, morbid obesity with chronic lymphedema in the extremities, hypertension, chronic atrial fibrillation on full anticoagulation with warfarin, recently discharged last month from COVID pneumonia and urinary tract infection to [facility].  For the past 5 days she has had increasing cough chest congestion wheezing and productive cough.  No fever reported.  No chest pain reported.  She has had a new oxygen requirement and is now on 2 L nasal cannula.  She was sent to the emergency department for further evaluation.  Seen in the ED and sent for CT chest with no findings of PE or pneumonia but found to have acute on chronic bronchitis.  She was treated in the ED unfortunately her symptoms did not improve enough for her to be able to discharge back to SNF. ? ?Questionable lytic foci within T7 and T4 vertebral bodies, T7 lesion ?demonstrating a defined sclerotic rim, nonspecific; consider ?follow-up radionuclide bone scan to exclude metastatic disease. ? ?06/13/2021:  I discussed with patient that she will need to follow up with her PCP to have a bone scan done. She said that she would follow up and do that.  She is feeling much better.  She is stable to discharge back to SNF today.   ? ?HOSPITAL COURSE BY PROBLEM LIST  ? ?Assessment and Plan: ?* Acute bronchitis and bronchiolitis ?Pt presented with new oxygen requirement, wheezing and coughing.  ?Mild COPD exacerbation related to weather changes.  ?Pt feeling better after being treated with steroids, doxycycline, bronchodilators, mucinex.  ?Pt will discharge on oxygen 2L/min.   ? ?Possible Lytic bone lesions on CT ?Questionable lytic foci within T7 and T4 vertebral bodies, T7 lesion demonstrating a defined sclerotic rim, nonspecific; consider follow-up radionuclide bone scan to exclude metastatic disease. ? ?I discussed with patient to follow up with her PCP to have an outpatient bone scan done (not an inpatient procedure), she verbalized understanding and agreed to follow up.   ? ? ?DNR (do not resuscitate) ?Continue DNR order while in hospital.  ? ?Gastroesophageal reflux disease without esophagitis ?Protonix ordered for GI protection in hospital.  Resume home PPI at discharge.  ? ?Chronic congestive heart failure ([place]) ?Mild pulmonary edema and mild BNP elevation ?Agree with lasix/potassium.  ? ? ?Mixed hyperlipidemia ?Resume home lipid lowering medication ? ?Acquired hypothyroidism ?Resume home thyroid supplement.  ? ?Lymphedema ?Continue lasix as ordered.  ? ?Generalized weakness ?Chronic, plan return to SNF at discharge.  ? ?PVD (peripheral vascular disease) with claudication ([place]) ?Stable ? ?Morbid obesity due to excess calories ([place]) ?Stable ? ?Long  term (current) use of anticoagulants ?Check daily PT/INR on warfarin per pharm D ? ?PPM-[place] ?Stable  ? ?Tachycardia-bradycardia syndrome ([place]) ?Currently stable home medications. ? ?Permanent atrial fibrillation ([place]) ?Rate controlled, and fully anticoagulated with  warfarin. ?Consulted pharm D for warfarin dosing in hospital.  ?Being on doxycycline we reduced to 3 mg daily.  Please check PT/INR in 3 days and adjust warfarin dose as needed for goal INR 2-3.  ? ?HYPERTENSION, BENIGN ?Stable, well controlled.  ? ? ?Discharge Diagnoses:  ?Principal Problem: ?  Acute bronchitis and bronchiolitis ?Active Problems: ?  DNR (do not resuscitate) ?  Possible Lytic bone lesions on CT ?  Gastroesophageal reflux disease without esophagitis ?  HYPERTENSION, BENIGN ?  Permanent atrial fibrillation ([place]) ?  Tachycardia-bradycardia syndrome ([place]) ?  PPM-[place] ?  Long term (current) use of anticoagulants ?  Morbid obesity due to excess calories ([place]) ?  PVD (peripheral vascular disease) with claudication ([place]) ?  Sleeps in sitting position due to orthopnea ?  Generalized weakness ?  Lymphedema ?  Acquired hypothyroidism ?  Mixed hyperlipidemia ?  Chronic congestive heart failure ([place]) ? ? ?Discharge Instructions: ? ?Allergies as of 06/13/2021   ?No Known Allergies ?  ? ?  ?Medication List  ?  ? ?TAKE these medications   ? ?albuterol 108 (90 Base) MCG/ACT inhaler ?Commonly known as: VENTOLIN HFA ?INHALE 2 PUFFS BY MOUTH EVERY 6 HOURS AS NEEDED FOR WHEEZING OR SHORTNESS OF BREATH ?What changed: See the new instructions. ?  ?atorvastatin 80 MG tablet ?Commonly known as: LIPITOR ?Take 1 tablet by mouth once daily ?  ?blood glucose meter kit and supplies ?Dispense based on patient and insurance preference. Use up to four times daily as directed. (FOR ICD-10 E10.9, E11.9). ?  ?clotrimazole-betamethasone cream ?Commonly known as: LOTRISONE ?APPLY  CREAM TOPICALLY TO AFFECTED AREA TWICE DAILY ?What changed: See the new instructions. ?  ?dextromethorphan 30 MG/5ML liquid ?Commonly known as: DELSYM ?Take 5 mLs (30 mg total) by mouth 2 (two) times daily for 3 days. ?  ?diltiazem 240 MG 24 hr capsule ?Commonly known as: CARDIZEM CD ?Take 1 capsule (240 mg total) by mouth daily. ?  ?doxycycline 100 MG  tablet ?Commonly known as: VIBRA-TABS ?Take 1 tablet (100 mg total) by mouth every 12 (twelve) hours for 3 days. ?  ?esomeprazole 40 MG capsule ?Commonly known as: [place] ?Take 1 capsule (40 mg total) by mouth daily. ?  ?furosemide 40 MG tablet ?Commonly known as: LASIX ?Take 0.5 tablets (20 mg total) by mouth daily for 5 days. ?Start taking on: June 14, 2021 ?  ?guaiFENesin 600 MG 12 hr tablet ?Commonly known as: [place] ?Take 2 tablets (1,200 mg total) by mouth 2 (two) times daily for 5 days. ?  ?ipratropium-albuterol 0.5-2.5 (3) MG/3ML Soln ?Commonly known as: DUONEB ?Take 3 mLs by nebulization in the morning and at bedtime. ?  ?levothyroxine 175 MCG tablet ?Commonly known as: SYNTHROID ?Take 1 tablet (175 mcg total) by mouth daily before breakfast. ?  ?metoprolol tartrate 100 MG tablet ?Commonly known as: LOPRESSOR ?Take 1 tablet (100 mg total) by mouth 2 (two) times daily. ?  ?mometasone 50 MCG/ACT nasal spray ?Commonly known as: Nasonex ?Place 2 sprays into the nose daily. ?  ?potassium chloride 10 MEQ tablet ?Commonly known as: KLOR-CON M ?Take 1 tablet (10 mEq total) by mouth daily for 5 days. ?Start taking on: June 14, 2021 ?  ?predniSONE 10 MG tablet ?Commonly known as: DELTASONE ?Take 2 tablets (20 mg total)  by mouth daily with breakfast for 3 days. ?Start taking on: June 14, 2021 ?  ?Vitamin D (Ergocalciferol) 1.25 MG (50000 UNIT) Caps capsule ?Commonly known as: DRISDOL ?Take 1 capsule by mouth once a week ?What changed: when to take this ?  ?Voltaren 1 % Gel ?Generic drug: diclofenac Sodium ?Apply 4 g topically in the morning, at noon, and at bedtime. ?  ?warfarin 3 MG tablet ?Commonly known as: COUMADIN ?Take as directed. If you are unsure how to take this medication, talk to your nurse or doctor. ?Original instructions: TAKE 1 TABLET BY MOUTH ONCE DAILY ?What changed: additional instructions ?  ? ?  ? ?  ?  ? ? ?  ?Durable Medical Equipment  ?(From admission, onward)  ?  ? ? ?  ? ?  Start      Ordered  ? 06/13/21 1012  For home use only DME oxygen  Once       ?Question Answer Comment  ?Length of Need Lifetime   ?Mode or (Route) Nasal cannula   ?Liters per Minute 2   ?Frequency Continuous (stationary and

## 2021-06-13 NOTE — Discharge Instructions (Signed)
IMPORTANT INFORMATION: PAY CLOSE ATTENTION  ? ?PHYSICIAN DISCHARGE INSTRUCTIONS ? ?Follow with Primary care provider  Hawks, Christy A, FNP  and other consultants as instructed by your Hospitalist Physician ? ?SEEK MEDICAL CARE OR RETURN TO EMERGENCY ROOM IF SYMPTOMS COME BACK, WORSEN OR NEW PROBLEM DEVELOPS  ? ?Please note: ?You were cared for by a hospitalist during your hospital stay. Every effort will be made to forward records to your primary care provider.  You can request that your primary care provider send for your hospital records if they have not received them.  Once you are discharged, your primary care physician will handle any further medical issues. Please note that NO REFILLS for any discharge medications will be authorized once you are discharged, as it is imperative that you return to your primary care physician (or establish a relationship with a primary care physician if you do not have one) for your post hospital discharge needs so that they can reassess your need for medications and monitor your lab values. ? ?Please get a complete blood count and chemistry panel checked by your Primary MD at your next visit, and again as instructed by your Primary MD. ? ?Get Medicines reviewed and adjusted: ?Please take all your medications with you for your next visit with your Primary MD ? ?Laboratory/radiological data: ?Please request your Primary MD to go over all hospital tests and procedure/radiological results at the follow up, please ask your primary care provider to get all Hospital records sent to his/her office. ? ?In some cases, they will be blood work, cultures and biopsy results pending at the time of your discharge. Please request that your primary care provider follow up on these results. ? ?If you are diabetic, please bring your blood sugar readings with you to your follow up appointment with primary care.   ? ?Please call and make your follow up appointments as soon as possible.   ? ?Also  Note the following: ?If you experience worsening of your admission symptoms, develop shortness of breath, life threatening emergency, suicidal or homicidal thoughts you must seek medical attention immediately by calling 911 or calling your MD immediately  if symptoms less severe. ? ?You must read complete instructions/literature along with all the possible adverse reactions/side effects for all the Medicines you take and that have been prescribed to you. Take any new Medicines after you have completely understood and accpet all the possible adverse reactions/side effects.  ? ?Do not drive when taking Pain medications or sleeping medications (Benzodiazepines) ? ?Do not take more than prescribed Pain, Sleep and Anxiety Medications. It is not advisable to combine anxiety,sleep and pain medications without talking with your primary care practitioner ? ?Special Instructions: If you have smoked or chewed Tobacco  in the last 2 yrs please stop smoking, stop any regular Alcohol  and or any Recreational drug use. ? ?Wear Seat belts while driving.  Do not drive if taking any narcotic, mind altering or controlled substances or recreational drugs or alcohol.  ? ? ? ? ? ?

## 2021-06-13 NOTE — Progress Notes (Signed)
SATURATION QUALIFICATIONS: (This note is used to comply with regulatory documentation for home oxygen) ? ?Patient Saturations on Room Air at Rest = 94% ? ?Patient Saturations on Room Air while Ambulating = 80% ? ?Patient Saturations on 2 Liters of oxygen while Ambulating = 99% ? ?Please briefly explain why patient needs home oxygen: SOB with ambulation, decrease in oxygen saturation with ambulation ?

## 2021-06-15 DIAGNOSIS — I10 Essential (primary) hypertension: Secondary | ICD-10-CM | POA: Diagnosis not present

## 2021-06-15 DIAGNOSIS — I4891 Unspecified atrial fibrillation: Secondary | ICD-10-CM | POA: Diagnosis not present

## 2021-06-15 DIAGNOSIS — I890 Lymphedema, not elsewhere classified: Secondary | ICD-10-CM | POA: Diagnosis not present

## 2021-06-15 DIAGNOSIS — Z8616 Personal history of COVID-19: Secondary | ICD-10-CM | POA: Diagnosis not present

## 2021-06-15 DIAGNOSIS — E785 Hyperlipidemia, unspecified: Secondary | ICD-10-CM | POA: Diagnosis not present

## 2021-06-15 DIAGNOSIS — Z5181 Encounter for therapeutic drug level monitoring: Secondary | ICD-10-CM | POA: Diagnosis not present

## 2021-06-15 DIAGNOSIS — Z7901 Long term (current) use of anticoagulants: Secondary | ICD-10-CM | POA: Diagnosis not present

## 2021-06-16 DIAGNOSIS — R2243 Localized swelling, mass and lump, lower limb, bilateral: Secondary | ICD-10-CM | POA: Diagnosis not present

## 2021-06-17 LAB — CULTURE, BLOOD (ROUTINE X 2)
Culture: NO GROWTH
Special Requests: ADEQUATE

## 2021-06-19 ENCOUNTER — Other Ambulatory Visit (HOSPITAL_COMMUNITY): Payer: Self-pay | Admitting: Nurse Practitioner

## 2021-06-19 DIAGNOSIS — M899 Disorder of bone, unspecified: Secondary | ICD-10-CM

## 2021-06-22 DIAGNOSIS — Z7901 Long term (current) use of anticoagulants: Secondary | ICD-10-CM | POA: Diagnosis not present

## 2021-06-22 DIAGNOSIS — E039 Hypothyroidism, unspecified: Secondary | ICD-10-CM | POA: Diagnosis not present

## 2021-06-22 DIAGNOSIS — I4891 Unspecified atrial fibrillation: Secondary | ICD-10-CM | POA: Diagnosis not present

## 2021-06-22 DIAGNOSIS — Z5181 Encounter for therapeutic drug level monitoring: Secondary | ICD-10-CM | POA: Diagnosis not present

## 2021-06-22 DIAGNOSIS — E785 Hyperlipidemia, unspecified: Secondary | ICD-10-CM | POA: Diagnosis not present

## 2021-06-22 DIAGNOSIS — I890 Lymphedema, not elsewhere classified: Secondary | ICD-10-CM | POA: Diagnosis not present

## 2021-06-26 ENCOUNTER — Encounter (HOSPITAL_COMMUNITY)
Admission: RE | Admit: 2021-06-26 | Discharge: 2021-06-26 | Disposition: A | Discharge status: Home / Self Care | Payer: No Typology Code available for payment source | Source: Ambulatory Visit | Attending: Nurse Practitioner | Admitting: Nurse Practitioner

## 2021-06-26 ENCOUNTER — Other Ambulatory Visit (HOSPITAL_COMMUNITY): Payer: Medicare Other

## 2021-06-26 ENCOUNTER — Encounter (HOSPITAL_COMMUNITY): Payer: Self-pay

## 2021-06-26 DIAGNOSIS — K219 Gastro-esophageal reflux disease without esophagitis: Secondary | ICD-10-CM | POA: Diagnosis not present

## 2021-06-26 DIAGNOSIS — E559 Vitamin D deficiency, unspecified: Secondary | ICD-10-CM | POA: Diagnosis not present

## 2021-06-26 DIAGNOSIS — M899 Disorder of bone, unspecified: Secondary | ICD-10-CM | POA: Diagnosis not present

## 2021-06-26 DIAGNOSIS — C9000 Multiple myeloma not having achieved remission: Secondary | ICD-10-CM | POA: Diagnosis not present

## 2021-06-26 DIAGNOSIS — E039 Hypothyroidism, unspecified: Secondary | ICD-10-CM | POA: Diagnosis not present

## 2021-06-26 DIAGNOSIS — I10 Essential (primary) hypertension: Secondary | ICD-10-CM | POA: Diagnosis not present

## 2021-06-26 DIAGNOSIS — I4891 Unspecified atrial fibrillation: Secondary | ICD-10-CM | POA: Diagnosis not present

## 2021-06-26 DIAGNOSIS — E785 Hyperlipidemia, unspecified: Secondary | ICD-10-CM | POA: Diagnosis not present

## 2021-06-26 HISTORY — DX: Unspecified asthma, uncomplicated: J45.909

## 2021-06-26 MED ORDER — TECHNETIUM TC 99M MEDRONATE IV KIT
20.0000 | PACK | Freq: Once | INTRAVENOUS | Status: AC | PRN
  Administered 2021-06-26: 19 via INTRAVENOUS

## 2021-06-29 DIAGNOSIS — E785 Hyperlipidemia, unspecified: Secondary | ICD-10-CM | POA: Diagnosis not present

## 2021-06-29 DIAGNOSIS — E039 Hypothyroidism, unspecified: Secondary | ICD-10-CM | POA: Diagnosis not present

## 2021-06-29 DIAGNOSIS — I890 Lymphedema, not elsewhere classified: Secondary | ICD-10-CM | POA: Diagnosis not present

## 2021-06-29 DIAGNOSIS — E559 Vitamin D deficiency, unspecified: Secondary | ICD-10-CM | POA: Diagnosis not present

## 2021-06-29 DIAGNOSIS — Z5181 Encounter for therapeutic drug level monitoring: Secondary | ICD-10-CM | POA: Diagnosis not present

## 2021-06-29 DIAGNOSIS — K219 Gastro-esophageal reflux disease without esophagitis: Secondary | ICD-10-CM | POA: Diagnosis not present

## 2021-06-29 DIAGNOSIS — I4891 Unspecified atrial fibrillation: Secondary | ICD-10-CM | POA: Diagnosis not present

## 2021-07-03 ENCOUNTER — Encounter: Payer: Medicare Other | Admitting: Internal Medicine

## 2021-07-03 DIAGNOSIS — M25512 Pain in left shoulder: Secondary | ICD-10-CM | POA: Diagnosis not present

## 2021-07-08 DIAGNOSIS — I4891 Unspecified atrial fibrillation: Secondary | ICD-10-CM | POA: Diagnosis not present

## 2021-07-08 DIAGNOSIS — Z7901 Long term (current) use of anticoagulants: Secondary | ICD-10-CM | POA: Diagnosis not present

## 2021-07-08 DIAGNOSIS — Z5181 Encounter for therapeutic drug level monitoring: Secondary | ICD-10-CM | POA: Diagnosis not present

## 2021-07-08 DIAGNOSIS — K219 Gastro-esophageal reflux disease without esophagitis: Secondary | ICD-10-CM | POA: Diagnosis not present

## 2021-07-08 DIAGNOSIS — E039 Hypothyroidism, unspecified: Secondary | ICD-10-CM | POA: Diagnosis not present

## 2021-07-08 DIAGNOSIS — I10 Essential (primary) hypertension: Secondary | ICD-10-CM | POA: Diagnosis not present

## 2021-07-08 DIAGNOSIS — M25562 Pain in left knee: Secondary | ICD-10-CM | POA: Diagnosis not present

## 2021-07-13 DIAGNOSIS — E785 Hyperlipidemia, unspecified: Secondary | ICD-10-CM | POA: Diagnosis not present

## 2021-07-13 DIAGNOSIS — I10 Essential (primary) hypertension: Secondary | ICD-10-CM | POA: Diagnosis not present

## 2021-07-16 ENCOUNTER — Ambulatory Visit (INDEPENDENT_AMBULATORY_CARE_PROVIDER_SITE_OTHER): Payer: Medicare Other

## 2021-07-16 DIAGNOSIS — I4821 Permanent atrial fibrillation: Secondary | ICD-10-CM

## 2021-07-16 NOTE — Patient Instructions (Signed)
Will call son with next months batter check appointment, preferably in [place].  ?

## 2021-07-17 DIAGNOSIS — I10 Essential (primary) hypertension: Secondary | ICD-10-CM | POA: Diagnosis not present

## 2021-07-17 DIAGNOSIS — R600 Localized edema: Secondary | ICD-10-CM | POA: Diagnosis not present

## 2021-07-17 LAB — CUP PACEART INCLINIC DEVICE CHECK
Battery Remaining Longevity: 5 mo
Battery Voltage: 2.71 V
Brady Statistic RA Percent Paced: 0 %
Brady Statistic RV Percent Paced: 2.9 %
Date Time Interrogation Session: 20230420083600
Implantable Lead Implant Date: 20100728
Implantable Lead Implant Date: 20100728
Implantable Lead Location: 753859
Implantable Lead Location: 753860
Implantable Pulse Generator Implant Date: 20100728
Lead Channel Impedance Value: 425 Ohm
Lead Channel Pacing Threshold Amplitude: 1 V
Lead Channel Pacing Threshold Pulse Width: 0.4 ms
Lead Channel Sensing Intrinsic Amplitude: 1.6 mV
Lead Channel Sensing Intrinsic Amplitude: 12 mV
Lead Channel Setting Pacing Amplitude: 2 V
Lead Channel Setting Pacing Pulse Width: 0.4 ms
Lead Channel Setting Sensing Sensitivity: 2 mV
Pulse Gen Model: 2210
Pulse Gen Serial Number: 2339600

## 2021-07-17 NOTE — Progress Notes (Signed)
Pacemaker check in clinic as patient does not participate in remote monitoring. Patient usually is seen in [place] but could not make appointment. Normal device function. Thresholds, sensing, impedances consistent with previous measurements. Device programmed to maximize longevity. No high ventricular rates noted. Irregular V-V. Known history of perm AF. Device programmed at appropriate safety margins. Histogram distribution appropriate for patient activity level. Device programmed to optimize intrinsic conduction. Estimated longevity 5 months. Patient will be schedule for in clinic monthly battery checks. Patient education completed. ?

## 2021-07-22 DIAGNOSIS — E039 Hypothyroidism, unspecified: Secondary | ICD-10-CM | POA: Diagnosis not present

## 2021-07-22 DIAGNOSIS — I10 Essential (primary) hypertension: Secondary | ICD-10-CM | POA: Diagnosis not present

## 2021-07-24 DIAGNOSIS — I4891 Unspecified atrial fibrillation: Secondary | ICD-10-CM | POA: Diagnosis not present

## 2021-07-24 DIAGNOSIS — I10 Essential (primary) hypertension: Secondary | ICD-10-CM | POA: Diagnosis not present

## 2021-07-24 DIAGNOSIS — K219 Gastro-esophageal reflux disease without esophagitis: Secondary | ICD-10-CM | POA: Diagnosis not present

## 2021-07-24 DIAGNOSIS — E785 Hyperlipidemia, unspecified: Secondary | ICD-10-CM | POA: Diagnosis not present

## 2021-07-31 ENCOUNTER — Telehealth: Payer: Self-pay | Admitting: Internal Medicine

## 2021-07-31 DIAGNOSIS — Z7901 Long term (current) use of anticoagulants: Secondary | ICD-10-CM | POA: Diagnosis not present

## 2021-07-31 DIAGNOSIS — M25512 Pain in left shoulder: Secondary | ICD-10-CM | POA: Diagnosis not present

## 2021-07-31 DIAGNOSIS — I10 Essential (primary) hypertension: Secondary | ICD-10-CM | POA: Diagnosis not present

## 2021-07-31 DIAGNOSIS — E785 Hyperlipidemia, unspecified: Secondary | ICD-10-CM | POA: Diagnosis not present

## 2021-07-31 DIAGNOSIS — E039 Hypothyroidism, unspecified: Secondary | ICD-10-CM | POA: Diagnosis not present

## 2021-07-31 DIAGNOSIS — E559 Vitamin D deficiency, unspecified: Secondary | ICD-10-CM | POA: Diagnosis not present

## 2021-07-31 DIAGNOSIS — I4891 Unspecified atrial fibrillation: Secondary | ICD-10-CM | POA: Diagnosis not present

## 2021-07-31 DIAGNOSIS — Z8616 Personal history of COVID-19: Secondary | ICD-10-CM | POA: Diagnosis not present

## 2021-07-31 NOTE — Telephone Encounter (Signed)
?  Pt c/o medication issue: ? ?1. Name of Medication: warfarin (COUMADIN) 3 MG tablet ? ?2. How are you currently taking this medication (dosage and times per day)? TAKE 1 TABLET BY MOUTH ONCE DAILY ? ?3. Are you having a reaction (difficulty breathing--STAT)?  ? ?4. What is your medication issue? Pt is not sure if she need to take 4 mg of her warfarin, she said she usually taking 3 mg. She asked if someone can call her back today because she doesn't know what to do. Also, she asked if she can get a machine where she can check her INR at home ?

## 2021-08-03 ENCOUNTER — Telehealth: Payer: Self-pay | Admitting: *Deleted

## 2021-08-03 ENCOUNTER — Telehealth: Payer: Self-pay | Admitting: Family

## 2021-08-03 ENCOUNTER — Ambulatory Visit (INDEPENDENT_AMBULATORY_CARE_PROVIDER_SITE_OTHER): Payer: Medicare Other | Admitting: *Deleted

## 2021-08-03 DIAGNOSIS — I4891 Unspecified atrial fibrillation: Secondary | ICD-10-CM

## 2021-08-03 DIAGNOSIS — Z5181 Encounter for therapeutic drug level monitoring: Secondary | ICD-10-CM | POA: Diagnosis not present

## 2021-08-03 LAB — POCT INR: INR: 3.1 — AB (ref 2.0–3.0)

## 2021-08-03 NOTE — Telephone Encounter (Signed)
New Message: ? ? ? ? ?She wants t o know if she can see you today? ?

## 2021-08-03 NOTE — Telephone Encounter (Signed)
Spoke with patient.  States she does not need refills for Warfarin '3mg'$  tablets at this time.  She has an INR appt this afternoon.  Will readdress at that time if dose changes. ?

## 2021-08-03 NOTE — Patient Instructions (Signed)
Hold warfarin tonight then resume 1 tablet daily except 1/2 tablet on Mondays  (Says she was sent home form SNF on '4mg'$  daily) ?Recheck in 2 weeks ?

## 2021-08-03 NOTE — Telephone Encounter (Signed)
Spoke with pt.  INR appt was moved from 5/9 to today 08/03/21 per her request. ? ?

## 2021-08-04 ENCOUNTER — Telehealth: Payer: Self-pay | Admitting: Family

## 2021-08-04 NOTE — Telephone Encounter (Signed)
Closing encounter. Working off another encounter ?

## 2021-08-04 NOTE — Addendum Note (Signed)
Addended by: Antonietta Barcelona D on: 08/04/2021 04:46 PM ? ? Modules accepted: Orders ? ?

## 2021-08-04 NOTE — Telephone Encounter (Signed)
Patient calling back to check on the status of the piece that broke on her nebulizer. Please call back.  ?

## 2021-08-04 NOTE — Telephone Encounter (Signed)
Second time calling pt back with busy signal, please make sure which number pt wants a call back on. ?

## 2021-08-04 NOTE — Telephone Encounter (Signed)
Megan Maryland do you know anything about this? Is this an order I have to do or can we just call the supplier to send a new one.  ?

## 2021-08-05 DIAGNOSIS — I4821 Permanent atrial fibrillation: Secondary | ICD-10-CM | POA: Diagnosis not present

## 2021-08-05 DIAGNOSIS — E785 Hyperlipidemia, unspecified: Secondary | ICD-10-CM | POA: Diagnosis not present

## 2021-08-05 DIAGNOSIS — S199XXA Unspecified injury of neck, initial encounter: Secondary | ICD-10-CM | POA: Diagnosis not present

## 2021-08-05 DIAGNOSIS — S0003XA Contusion of scalp, initial encounter: Secondary | ICD-10-CM | POA: Diagnosis not present

## 2021-08-05 DIAGNOSIS — R918 Other nonspecific abnormal finding of lung field: Secondary | ICD-10-CM | POA: Diagnosis not present

## 2021-08-05 DIAGNOSIS — R9431 Abnormal electrocardiogram [ECG] [EKG]: Secondary | ICD-10-CM | POA: Diagnosis not present

## 2021-08-05 DIAGNOSIS — S0083XA Contusion of other part of head, initial encounter: Secondary | ICD-10-CM | POA: Diagnosis not present

## 2021-08-05 DIAGNOSIS — R9082 White matter disease, unspecified: Secondary | ICD-10-CM | POA: Diagnosis not present

## 2021-08-05 DIAGNOSIS — Z950 Presence of cardiac pacemaker: Secondary | ICD-10-CM | POA: Diagnosis not present

## 2021-08-05 DIAGNOSIS — M79605 Pain in left leg: Secondary | ICD-10-CM | POA: Diagnosis not present

## 2021-08-05 DIAGNOSIS — S80919A Unspecified superficial injury of unspecified knee, initial encounter: Secondary | ICD-10-CM | POA: Diagnosis not present

## 2021-08-05 DIAGNOSIS — S0990XA Unspecified injury of head, initial encounter: Secondary | ICD-10-CM | POA: Diagnosis not present

## 2021-08-05 DIAGNOSIS — R58 Hemorrhage, not elsewhere classified: Secondary | ICD-10-CM | POA: Diagnosis not present

## 2021-08-05 DIAGNOSIS — G319 Degenerative disease of nervous system, unspecified: Secondary | ICD-10-CM | POA: Diagnosis not present

## 2021-08-05 DIAGNOSIS — W0110XA Fall on same level from slipping, tripping and stumbling with subsequent striking against unspecified object, initial encounter: Secondary | ICD-10-CM | POA: Diagnosis not present

## 2021-08-05 DIAGNOSIS — R52 Pain, unspecified: Secondary | ICD-10-CM | POA: Diagnosis not present

## 2021-08-05 DIAGNOSIS — I509 Heart failure, unspecified: Secondary | ICD-10-CM | POA: Diagnosis not present

## 2021-08-05 DIAGNOSIS — I959 Hypotension, unspecified: Secondary | ICD-10-CM | POA: Diagnosis not present

## 2021-08-05 DIAGNOSIS — Z043 Encounter for examination and observation following other accident: Secondary | ICD-10-CM | POA: Diagnosis not present

## 2021-08-05 DIAGNOSIS — Z6841 Body Mass Index (BMI) 40.0 and over, adult: Secondary | ICD-10-CM | POA: Diagnosis not present

## 2021-08-05 DIAGNOSIS — E119 Type 2 diabetes mellitus without complications: Secondary | ICD-10-CM | POA: Diagnosis not present

## 2021-08-05 DIAGNOSIS — M47812 Spondylosis without myelopathy or radiculopathy, cervical region: Secondary | ICD-10-CM | POA: Diagnosis not present

## 2021-08-05 DIAGNOSIS — M25562 Pain in left knee: Secondary | ICD-10-CM | POA: Diagnosis not present

## 2021-08-05 DIAGNOSIS — R609 Edema, unspecified: Secondary | ICD-10-CM | POA: Diagnosis not present

## 2021-08-05 DIAGNOSIS — I110 Hypertensive heart disease with heart failure: Secondary | ICD-10-CM | POA: Diagnosis not present

## 2021-08-05 DIAGNOSIS — E669 Obesity, unspecified: Secondary | ICD-10-CM | POA: Diagnosis not present

## 2021-08-05 MED ORDER — IPRATROPIUM-ALBUTEROL 0.5-2.5 (3) MG/3ML IN SOLN: 3.0000 mL | Freq: Two times a day (BID) | RESPIRATORY_TRACT | 0 refills | Status: DC

## 2021-08-05 NOTE — Addendum Note (Signed)
Addended by: Antonietta Barcelona D on: 08/05/2021 03:57 PM ? ? Modules accepted: Orders ? ?

## 2021-08-05 NOTE — Telephone Encounter (Signed)
Pt aware refill sent to pharmacy 

## 2021-08-05 NOTE — Telephone Encounter (Signed)
Pt called to let Tye Maryland know that [facility] does not have her Albuterol in stock.  ? ?Wants to know if we can send Rx to [facility] in [place].  ?

## 2021-08-05 NOTE — Telephone Encounter (Signed)
LMOVM neb sol sent to [facility] ?

## 2021-08-06 NOTE — Telephone Encounter (Signed)
Pt rc for nurse. She says that [facility] does not have rx in stock. Please try to send to [facility]. Please call back ?

## 2021-08-06 NOTE — Telephone Encounter (Signed)
Pt called to say disregard the refill request on her albuterol. Says she received one in the mail and didn't know about it until now.  ?

## 2021-08-07 ENCOUNTER — Ambulatory Visit: Payer: Medicare Other | Admitting: Internal Medicine

## 2021-08-11 ENCOUNTER — Ambulatory Visit (INDEPENDENT_AMBULATORY_CARE_PROVIDER_SITE_OTHER): Payer: Medicare Other | Admitting: Family

## 2021-08-11 ENCOUNTER — Telehealth: Payer: Self-pay | Admitting: Family

## 2021-08-11 ENCOUNTER — Encounter: Payer: Self-pay | Admitting: Family

## 2021-08-11 DIAGNOSIS — M7989 Other specified soft tissue disorders: Secondary | ICD-10-CM | POA: Diagnosis not present

## 2021-08-11 DIAGNOSIS — Z09 Encounter for follow-up examination after completed treatment for conditions other than malignant neoplasm: Secondary | ICD-10-CM

## 2021-08-11 DIAGNOSIS — S0990XD Unspecified injury of head, subsequent encounter: Secondary | ICD-10-CM | POA: Diagnosis not present

## 2021-08-11 DIAGNOSIS — W19XXXA Unspecified fall, initial encounter: Secondary | ICD-10-CM

## 2021-08-11 DIAGNOSIS — M79605 Pain in left leg: Secondary | ICD-10-CM | POA: Diagnosis not present

## 2021-08-11 DIAGNOSIS — R531 Weakness: Secondary | ICD-10-CM

## 2021-08-11 NOTE — Patient Instructions (Signed)
Contusion A contusion is a deep bruise. Contusions are the result of a blunt injury to tissues and muscle fibers under the skin. The injury causes bleeding under the skin. The skin overlying the contusion may turn blue, purple, or yellow. Minor injuries will give you a painless contusion, but more severe injuries cause contusions that may stay painful and swollen for a few weeks. Follow these instructions at home: Pay attention to any changes in your symptoms. Let your health care provider know about them. Take these actions to relieve your pain. Managing pain, stiffness, and swelling  Use resting, icing, applying pressure (compression), and raising (elevating) the injured area. This is often called the RICE strategy. Rest the injured area. Return to your normal activities as told by your health care provider. Ask your health care provider what activities are safe for you. If directed, put ice on the injured area: Put ice in a plastic bag. Place a towel between your skin and the bag. Leave the ice on for 20 minutes, 2-3 times per day. If directed, apply light compression to the injured area using an elastic bandage. Make sure the bandage is not wrapped too tightly. Remove and reapply the bandage as directed by your health care provider. If possible, raise (elevate) the injured area above the level of your heart while you are sitting or lying down. General instructions Take over-the-counter and prescription medicines only as told by your health care provider. Keep all follow-up visits as told by your health care provider. This is important. Contact a health care provider if: Your symptoms do not improve after several days of treatment. Your symptoms get worse. You have difficulty moving the injured area. Get help right away if: You have severe pain. You have numbness in a hand or foot. Your hand or foot turns pale or cold. Summary A contusion is a deep bruise. Contusions are the result of a  blunt injury to tissues and muscle fibers under the skin. It is treated with rest, ice, compression, and elevation. You may be given over-the-counter medicines for pain. Contact a health care provider if your symptoms do not improve, or get worse. Get help right away if you have severe pain, have numbness, or the area turns pale or cold. This information is not intended to replace advice given to you by your health care provider. Make sure you discuss any questions you have with your health care provider. Document Revised: 01/27/2021 Document Reviewed: 01/08/2021 Elsevier Patient Education  2023 Elsevier Inc.  

## 2021-08-11 NOTE — Progress Notes (Signed)
? ?Virtual Visit  Note ?Due to COVID-19 pandemic this visit was conducted virtually. This visit type was conducted due to national recommendations for restrictions regarding the COVID-19 Pandemic (e.g. social distancing, sheltering in place) in an effort to limit this patient's exposure and mitigate transmission in our community. All issues noted in this document were discussed and addressed.  A physical exam was not performed with this format. ? ?I connected with Megan [name] on 08/11/21 at 1:53 pm by telephone and verified that I am speaking with the correct person using two identifiers. Megan [name] is currently located at home and no one is currently with her during visit. The provider, Evelina Dun, FNP is located in their office at time of visit. ? ?I discussed the limitations, risks, security and privacy concerns of performing an evaluation and management service by telephone and the availability of in person appointments. I also discussed with the patient that there may be a patient responsible charge related to this service. The patient expressed understanding and agreed to proceed. ? ? ?History and Present Illness: ? ?Pt calls the office today for hospital follow up. She states she unable to come into the office or do a video visit. She currently only has a land line.  ? ?She reports she was walking to her car and "passed out" and hit her head on 08/05/21. She went to the ED had a CT head that showed, "1. No acute intracranial abnormality.  ?2. Signs of atrophy and chronic microvascular ischemic change with  ?patchy white matter disease.  ?3. Small LEFT periorbital and LEFT supraorbital scalp  ?hematoma/contusion.  ?4. No evidence of acute fracture or static subluxation of the  ?cervical spine exam limited by technical factors as outlined above.  ?5. Multilevel degenerative changes of the cervical spine.  ?6. Cervical ribs at C7. " ? ?She is complaining of bruising and swelling on her left leg.   She had a negative x-ray of knee and tibia and fibula. She has hx of cellulitis and worried about this.  ? ?She takes warfarin for hx of DVT and A Fib.  ?Fall ?The accident occurred More than 1 week ago. She landed on Concrete. The volume of blood lost was minimal. The point of impact was the head and left knee. The pain is at a severity of 7/10. The pain is moderate.  ? ? ? ?Review of Systems  ?Constitutional:  Positive for malaise/fatigue.  ?Musculoskeletal:  Positive for falls.  ?All other systems reviewed and are negative. ? ? ?Observations/Objective: ?No SOB or distress noted  ? ?Assessment and Plan: ?1. Left leg pain ? ?2. Left leg swelling ? ?3. Fall on concrete ? ?4. Injury of head, subsequent encounter ? ?5. Hospital discharge follow-up ? ?6. General weakness ? ?Encourage ROM exercise ?Ice  ?Elevated ?Given pt can not come into office or do video visit, exam is limited. However, it sounds like hematoma ?Discussed we will have to do a video visit for home health or PT  ?PT will call if leg worsen or do not improve  ?  ?I discussed the assessment and treatment plan with the patient. The patient was provided an opportunity to ask questions and all were answered. The patient agreed with the plan and demonstrated an understanding of the instructions. ?  ?The patient was advised to call back or seek an in-person evaluation if the symptoms worsen or if the condition fails to improve as anticipated. ? ?The above assessment and management plan  was discussed with the patient. The patient verbalized understanding of and has agreed to the management plan. Patient is aware to call the clinic if symptoms persist or worsen. Patient is aware when to return to the clinic for a follow-up visit. Patient educated on when it is appropriate to go to the emergency department.  ? ?Time call ended:  2:26 pm  ? ?I provided 33 minutes of  non face-to-face time during this encounter. ? ? ? ?Evelina Dun, FNP ? ? ?

## 2021-08-11 NOTE — Telephone Encounter (Signed)
Appointment scheduled.

## 2021-08-14 ENCOUNTER — Telehealth: Payer: Self-pay | Admitting: Family

## 2021-08-15 ENCOUNTER — Inpatient Hospital Stay
Admission: AD | Admit: 2021-08-15 | Payer: No Typology Code available for payment source | Source: Other Acute Inpatient Hospital | Admitting: Neurosurgery

## 2021-08-15 DIAGNOSIS — I10 Essential (primary) hypertension: Secondary | ICD-10-CM | POA: Diagnosis not present

## 2021-08-15 DIAGNOSIS — W19XXXA Unspecified fall, initial encounter: Secondary | ICD-10-CM | POA: Diagnosis not present

## 2021-08-15 DIAGNOSIS — S0990XA Unspecified injury of head, initial encounter: Secondary | ICD-10-CM | POA: Diagnosis not present

## 2021-08-15 DIAGNOSIS — R52 Pain, unspecified: Secondary | ICD-10-CM | POA: Diagnosis not present

## 2021-08-15 DIAGNOSIS — I440 Atrioventricular block, first degree: Secondary | ICD-10-CM | POA: Diagnosis not present

## 2021-08-15 DIAGNOSIS — R000 Tachycardia, unspecified: Secondary | ICD-10-CM | POA: Diagnosis not present

## 2021-08-15 DIAGNOSIS — H9201 Otalgia, right ear: Secondary | ICD-10-CM | POA: Diagnosis not present

## 2021-08-15 DIAGNOSIS — I6200 Nontraumatic subdural hemorrhage, unspecified: Secondary | ICD-10-CM | POA: Diagnosis not present

## 2021-08-15 DIAGNOSIS — G459 Transient cerebral ischemic attack, unspecified: Secondary | ICD-10-CM | POA: Diagnosis not present

## 2021-08-15 DIAGNOSIS — R0902 Hypoxemia: Secondary | ICD-10-CM | POA: Diagnosis not present

## 2021-08-15 DIAGNOSIS — E669 Obesity, unspecified: Secondary | ICD-10-CM | POA: Diagnosis not present

## 2021-08-15 DIAGNOSIS — G319 Degenerative disease of nervous system, unspecified: Secondary | ICD-10-CM | POA: Diagnosis not present

## 2021-08-15 DIAGNOSIS — Z609 Problem related to social environment, unspecified: Secondary | ICD-10-CM | POA: Diagnosis not present

## 2021-08-15 DIAGNOSIS — Z7901 Long term (current) use of anticoagulants: Secondary | ICD-10-CM | POA: Diagnosis not present

## 2021-08-15 DIAGNOSIS — I959 Hypotension, unspecified: Secondary | ICD-10-CM | POA: Diagnosis not present

## 2021-08-15 DIAGNOSIS — E119 Type 2 diabetes mellitus without complications: Secondary | ICD-10-CM | POA: Diagnosis not present

## 2021-08-15 DIAGNOSIS — R4781 Slurred speech: Secondary | ICD-10-CM | POA: Diagnosis not present

## 2021-08-15 DIAGNOSIS — S065X0A Traumatic subdural hemorrhage without loss of consciousness, initial encounter: Secondary | ICD-10-CM | POA: Diagnosis not present

## 2021-08-16 DIAGNOSIS — R000 Tachycardia, unspecified: Secondary | ICD-10-CM | POA: Diagnosis not present

## 2021-08-16 DIAGNOSIS — Z950 Presence of cardiac pacemaker: Secondary | ICD-10-CM | POA: Diagnosis not present

## 2021-08-16 DIAGNOSIS — J984 Other disorders of lung: Secondary | ICD-10-CM | POA: Diagnosis not present

## 2021-08-16 DIAGNOSIS — S065X0A Traumatic subdural hemorrhage without loss of consciousness, initial encounter: Secondary | ICD-10-CM | POA: Diagnosis not present

## 2021-08-16 DIAGNOSIS — R5381 Other malaise: Secondary | ICD-10-CM | POA: Diagnosis not present

## 2021-08-16 DIAGNOSIS — R569 Unspecified convulsions: Secondary | ICD-10-CM | POA: Diagnosis not present

## 2021-08-16 DIAGNOSIS — R4182 Altered mental status, unspecified: Secondary | ICD-10-CM | POA: Diagnosis not present

## 2021-08-16 DIAGNOSIS — E785 Hyperlipidemia, unspecified: Secondary | ICD-10-CM | POA: Diagnosis not present

## 2021-08-16 DIAGNOSIS — R600 Localized edema: Secondary | ICD-10-CM | POA: Diagnosis not present

## 2021-08-16 DIAGNOSIS — I517 Cardiomegaly: Secondary | ICD-10-CM | POA: Diagnosis not present

## 2021-08-16 DIAGNOSIS — S065XAA Traumatic subdural hemorrhage with loss of consciousness status unknown, initial encounter: Secondary | ICD-10-CM | POA: Diagnosis not present

## 2021-08-16 DIAGNOSIS — I6200 Nontraumatic subdural hemorrhage, unspecified: Secondary | ICD-10-CM | POA: Diagnosis not present

## 2021-08-16 DIAGNOSIS — M79662 Pain in left lower leg: Secondary | ICD-10-CM | POA: Diagnosis not present

## 2021-08-16 DIAGNOSIS — Z6841 Body Mass Index (BMI) 40.0 and over, adult: Secondary | ICD-10-CM | POA: Diagnosis not present

## 2021-08-16 DIAGNOSIS — S065X9A Traumatic subdural hemorrhage with loss of consciousness of unspecified duration, initial encounter: Secondary | ICD-10-CM | POA: Diagnosis not present

## 2021-08-16 DIAGNOSIS — S0990XA Unspecified injury of head, initial encounter: Secondary | ICD-10-CM | POA: Diagnosis not present

## 2021-08-16 DIAGNOSIS — J449 Chronic obstructive pulmonary disease, unspecified: Secondary | ICD-10-CM | POA: Diagnosis not present

## 2021-08-16 DIAGNOSIS — Z713 Dietary counseling and surveillance: Secondary | ICD-10-CM | POA: Diagnosis not present

## 2021-08-16 DIAGNOSIS — T45515A Adverse effect of anticoagulants, initial encounter: Secondary | ICD-10-CM | POA: Diagnosis not present

## 2021-08-16 DIAGNOSIS — R471 Dysarthria and anarthria: Secondary | ICD-10-CM | POA: Diagnosis not present

## 2021-08-16 DIAGNOSIS — R402364 Coma scale, best motor response, obeys commands, 24 hours or more after hospital admission: Secondary | ICD-10-CM | POA: Diagnosis not present

## 2021-08-16 DIAGNOSIS — R402144 Coma scale, eyes open, spontaneous, 24 hours or more after hospital admission: Secondary | ICD-10-CM | POA: Diagnosis not present

## 2021-08-16 DIAGNOSIS — I495 Sick sinus syndrome: Secondary | ICD-10-CM | POA: Diagnosis not present

## 2021-08-16 DIAGNOSIS — G9349 Other encephalopathy: Secondary | ICD-10-CM | POA: Diagnosis not present

## 2021-08-16 DIAGNOSIS — R109 Unspecified abdominal pain: Secondary | ICD-10-CM | POA: Diagnosis not present

## 2021-08-16 DIAGNOSIS — R402254 Coma scale, best verbal response, oriented, 24 hours or more after hospital admission: Secondary | ICD-10-CM | POA: Diagnosis not present

## 2021-08-16 DIAGNOSIS — R4781 Slurred speech: Secondary | ICD-10-CM | POA: Diagnosis not present

## 2021-08-16 DIAGNOSIS — G40109 Localization-related (focal) (partial) symptomatic epilepsy and epileptic syndromes with simple partial seizures, not intractable, without status epilepticus: Secondary | ICD-10-CM | POA: Diagnosis not present

## 2021-08-16 DIAGNOSIS — K219 Gastro-esophageal reflux disease without esophagitis: Secondary | ICD-10-CM | POA: Diagnosis not present

## 2021-08-16 DIAGNOSIS — D649 Anemia, unspecified: Secondary | ICD-10-CM | POA: Diagnosis not present

## 2021-08-16 DIAGNOSIS — I10 Essential (primary) hypertension: Secondary | ICD-10-CM | POA: Diagnosis not present

## 2021-08-16 DIAGNOSIS — G319 Degenerative disease of nervous system, unspecified: Secondary | ICD-10-CM | POA: Diagnosis not present

## 2021-08-16 DIAGNOSIS — H9201 Otalgia, right ear: Secondary | ICD-10-CM | POA: Diagnosis not present

## 2021-08-16 DIAGNOSIS — D6832 Hemorrhagic disorder due to extrinsic circulating anticoagulants: Secondary | ICD-10-CM | POA: Diagnosis not present

## 2021-08-16 DIAGNOSIS — K921 Melena: Secondary | ICD-10-CM | POA: Diagnosis not present

## 2021-08-16 DIAGNOSIS — I4821 Permanent atrial fibrillation: Secondary | ICD-10-CM | POA: Diagnosis not present

## 2021-08-16 DIAGNOSIS — R4701 Aphasia: Secondary | ICD-10-CM | POA: Diagnosis not present

## 2021-08-16 DIAGNOSIS — G4733 Obstructive sleep apnea (adult) (pediatric): Secondary | ICD-10-CM | POA: Diagnosis not present

## 2021-08-16 DIAGNOSIS — E039 Hypothyroidism, unspecified: Secondary | ICD-10-CM | POA: Diagnosis not present

## 2021-08-17 NOTE — Telephone Encounter (Signed)
She can send a picture through her MyChart. If it is worsening, she needs to be seen in person.

## 2021-08-17 NOTE — Telephone Encounter (Signed)
LM to RC  

## 2021-08-22 DIAGNOSIS — E039 Hypothyroidism, unspecified: Secondary | ICD-10-CM | POA: Diagnosis not present

## 2021-08-22 DIAGNOSIS — S065XAD Traumatic subdural hemorrhage with loss of consciousness status unknown, subsequent encounter: Secondary | ICD-10-CM | POA: Diagnosis not present

## 2021-08-22 DIAGNOSIS — R42 Dizziness and giddiness: Secondary | ICD-10-CM | POA: Diagnosis not present

## 2021-08-22 DIAGNOSIS — I10 Essential (primary) hypertension: Secondary | ICD-10-CM | POA: Diagnosis not present

## 2021-08-22 DIAGNOSIS — I110 Hypertensive heart disease with heart failure: Secondary | ICD-10-CM | POA: Diagnosis not present

## 2021-08-22 DIAGNOSIS — I509 Heart failure, unspecified: Secondary | ICD-10-CM | POA: Diagnosis not present

## 2021-08-22 DIAGNOSIS — J449 Chronic obstructive pulmonary disease, unspecified: Secondary | ICD-10-CM | POA: Diagnosis not present

## 2021-08-22 DIAGNOSIS — I482 Chronic atrial fibrillation, unspecified: Secondary | ICD-10-CM | POA: Diagnosis not present

## 2021-08-22 DIAGNOSIS — I4891 Unspecified atrial fibrillation: Secondary | ICD-10-CM | POA: Diagnosis not present

## 2021-08-22 DIAGNOSIS — N393 Stress incontinence (female) (male): Secondary | ICD-10-CM | POA: Diagnosis not present

## 2021-08-22 DIAGNOSIS — S065XAA Traumatic subdural hemorrhage with loss of consciousness status unknown, initial encounter: Secondary | ICD-10-CM | POA: Diagnosis not present

## 2021-08-22 DIAGNOSIS — R4189 Other symptoms and signs involving cognitive functions and awareness: Secondary | ICD-10-CM | POA: Diagnosis not present

## 2021-08-22 DIAGNOSIS — S8012XD Contusion of left lower leg, subsequent encounter: Secondary | ICD-10-CM | POA: Diagnosis not present

## 2021-08-22 DIAGNOSIS — E785 Hyperlipidemia, unspecified: Secondary | ICD-10-CM | POA: Diagnosis not present

## 2021-08-22 DIAGNOSIS — I495 Sick sinus syndrome: Secondary | ICD-10-CM | POA: Diagnosis not present

## 2021-08-22 DIAGNOSIS — S065X0D Traumatic subdural hemorrhage without loss of consciousness, subsequent encounter: Secondary | ICD-10-CM | POA: Diagnosis not present

## 2021-08-22 DIAGNOSIS — R269 Unspecified abnormalities of gait and mobility: Secondary | ICD-10-CM | POA: Diagnosis not present

## 2021-08-22 DIAGNOSIS — G4733 Obstructive sleep apnea (adult) (pediatric): Secondary | ICD-10-CM | POA: Diagnosis not present

## 2021-08-22 DIAGNOSIS — Z9181 History of falling: Secondary | ICD-10-CM | POA: Diagnosis not present

## 2021-08-22 DIAGNOSIS — Z7901 Long term (current) use of anticoagulants: Secondary | ICD-10-CM | POA: Diagnosis not present

## 2021-08-22 DIAGNOSIS — S065X0A Traumatic subdural hemorrhage without loss of consciousness, initial encounter: Secondary | ICD-10-CM | POA: Diagnosis not present

## 2021-08-22 DIAGNOSIS — I4821 Permanent atrial fibrillation: Secondary | ICD-10-CM | POA: Diagnosis not present

## 2021-08-22 DIAGNOSIS — D649 Anemia, unspecified: Secondary | ICD-10-CM | POA: Diagnosis not present

## 2021-08-22 DIAGNOSIS — S30810D Abrasion of lower back and pelvis, subsequent encounter: Secondary | ICD-10-CM | POA: Diagnosis not present

## 2021-08-22 DIAGNOSIS — R2689 Other abnormalities of gait and mobility: Secondary | ICD-10-CM | POA: Diagnosis not present

## 2021-08-22 DIAGNOSIS — Z950 Presence of cardiac pacemaker: Secondary | ICD-10-CM | POA: Diagnosis not present

## 2021-08-22 DIAGNOSIS — K219 Gastro-esophageal reflux disease without esophagitis: Secondary | ICD-10-CM | POA: Diagnosis not present

## 2021-08-22 DIAGNOSIS — Z6841 Body Mass Index (BMI) 40.0 and over, adult: Secondary | ICD-10-CM | POA: Diagnosis not present

## 2021-08-28 NOTE — Telephone Encounter (Signed)
Patient has had hospital admission since this call was placed

## 2021-09-04 ENCOUNTER — Ambulatory Visit (INDEPENDENT_AMBULATORY_CARE_PROVIDER_SITE_OTHER): Payer: Medicare Other | Admitting: Internal Medicine

## 2021-09-04 ENCOUNTER — Encounter: Payer: Self-pay | Admitting: Internal Medicine

## 2021-09-04 VITALS — BP 144/81 | HR 103 | Ht 60.0 in | Wt 266.0 lb

## 2021-09-04 DIAGNOSIS — I441 Atrioventricular block, second degree: Secondary | ICD-10-CM | POA: Diagnosis not present

## 2021-09-04 DIAGNOSIS — I10 Essential (primary) hypertension: Secondary | ICD-10-CM

## 2021-09-04 DIAGNOSIS — I4821 Permanent atrial fibrillation: Secondary | ICD-10-CM

## 2021-09-04 LAB — CUP PACEART INCLINIC DEVICE CHECK
Battery Remaining Longevity: 4 mo
Battery Voltage: 2.69 V
Brady Statistic RA Percent Paced: 0 %
Brady Statistic RV Percent Paced: 1.7 %
Date Time Interrogation Session: 20230609160630
Implantable Lead Implant Date: 20100728
Implantable Lead Implant Date: 20100728
Implantable Lead Location: 753859
Implantable Lead Location: 753860
Implantable Pulse Generator Implant Date: 20100728
Lead Channel Impedance Value: 425 Ohm
Lead Channel Pacing Threshold Amplitude: 1 V
Lead Channel Pacing Threshold Amplitude: 1 V
Lead Channel Pacing Threshold Pulse Width: 0.4 ms
Lead Channel Pacing Threshold Pulse Width: 0.4 ms
Lead Channel Sensing Intrinsic Amplitude: 1.6 mV
Lead Channel Sensing Intrinsic Amplitude: 10.1 mV
Lead Channel Setting Pacing Amplitude: 2 V
Lead Channel Setting Pacing Pulse Width: 0.4 ms
Lead Channel Setting Sensing Sensitivity: 2 mV
Pulse Gen Model: 2210
Pulse Gen Serial Number: 2339600

## 2021-09-04 NOTE — Patient Instructions (Signed)
Medication Instructions:  Your physician recommends that you continue on your current medications as directed. Please refer to the Current Medication list given to you today.  *If you need a refill on your cardiac medications before your next appointment, please call your pharmacy*   Lab Work: None ordered.  If you have labs (blood work) drawn today and your tests are completely normal, you will receive your results only by: [place] (if you have MyChart) OR A paper copy in the mail If you have any lab test that is abnormal or we need to change your treatment, we will call you to review the results.   Testing/Procedures: None ordered.    Follow-Up: At [facility], you and your health needs are our priority.  As part of our continuing mission to provide you with exceptional heart care, we have created designated Provider Care Teams.  These Care Teams include your primary Cardiologist (physician) and Advanced Practice Providers (APPs -  Physician Assistants and Nurse Practitioners) who all work together to provide you with the care you need, when you need it.  We recommend signing up for the patient portal called "MyChart".  Sign up information is provided on this After Visit Summary.  MyChart is used to connect with patients for Virtual Visits (Telemedicine).  Patients are able to view lab/test results, encounter notes, upcoming appointments, etc.  Non-urgent messages can be sent to your provider as well.   To learn more about what you can do with MyChart, go to [email or web address].    Your next appointment:   3 months with Dr Rayann Heman  Important Information About Sugar

## 2021-09-04 NOTE — Progress Notes (Signed)
PCP: Sharion Balloon, FNP Primary Cardiologist: Dr Domenic Polite Primary EP:  Dr Rayann Heman  Megan [name] is a 76 y.o. female who presents today for routine electrophysiology followup.  She has not done well recently.  She has been in rehab.  She had a mechanical fall and developed SDH for which she was hospitalized.  Her coumadin was discontinued.  She remains weak.  Today, she denies symptoms of palpitations, chest pain, shortness of breath,  lower extremity edema, dizziness, presyncope, or syncope.  The patient is otherwise without complaint today.   Past Medical History:  Diagnosis Date   Acute diastolic heart failure (HCC)    Anxiety state, unspecified    Asthma    Atrial fibrillation (HCC)    Congestive heart failure, unspecified    Degenerative joint disease    GERD (gastroesophageal reflux disease)    Hyperlipidemia    Hypertension    Lymphedema    Obesity    OSA (obstructive sleep apnea) 06/09/2015   Persistent atrial fibrillation (HCC)     post-termination pauses with afib s/p PPM   Presence of permanent cardiac pacemaker    Sinoatrial node dysfunction ([place])    s/p PPM   Sleep apnea    compliant with CPAP   Unspecified hypothyroidism    Unspecified venous (peripheral) insufficiency    Past Surgical History:  Procedure Laterality Date   CATARACT EXTRACTION W/PHACO Left 09/09/2014   Procedure: CATARACT EXTRACTION PHACO AND INTRAOCULAR LENS PLACEMENT ([place]);  Surgeon: Tonny Branch, MD;  Location: [facility];  Service: Ophthalmology;  Laterality: Left;  CDE: 6.99   CATARACT EXTRACTION W/PHACO Right 10/07/2014   Procedure: CATARACT EXTRACTION PHACO AND INTRAOCULAR LENS PLACEMENT RIGHT EYE;  Surgeon: Tonny Branch, MD;  Location: [facility];  Service: Ophthalmology;  Laterality: Right;  CDE:5.16   PACEMAKER INSERTION     SJM by JA    ROS- all systems are reviewed and negative except as per HPI above  Current Outpatient Medications  Medication Sig Dispense Refill   albuterol  (VENTOLIN HFA) 108 (90 Base) MCG/ACT inhaler INHALE 2 PUFFS BY MOUTH EVERY 6 HOURS AS NEEDED FOR WHEEZING OR SHORTNESS OF BREATH (Patient taking differently: Inhale 2 puffs into the lungs every 6 (six) hours as needed for shortness of breath.) 18 g 0   atorvastatin (LIPITOR) 80 MG tablet Take 1 tablet by mouth once daily (Patient taking differently: Take 80 mg by mouth daily.) 90 tablet 1   blood glucose meter kit and supplies Dispense based on patient and insurance preference. Use up to four times daily as directed. (FOR ICD-10 E10.9, E11.9). 1 each 0   clotrimazole-betamethasone (LOTRISONE) cream APPLY  CREAM TOPICALLY TO AFFECTED AREA TWICE DAILY (Patient taking differently: Apply 1 application  topically 2 (two) times daily.) 45 g 0   diclofenac Sodium (VOLTAREN) 1 % GEL Apply 4 g topically in the morning, at noon, and at bedtime.     diltiazem (CARDIZEM CD) 240 MG 24 hr capsule Take 1 capsule (240 mg total) by mouth daily. 90 capsule 1   esomeprazole (NEXIUM) 40 MG capsule Take 1 capsule (40 mg total) by mouth daily. 90 capsule 1   ipratropium-albuterol (DUONEB) 0.5-2.5 (3) MG/3ML SOLN Take 3 mLs by nebulization in the morning and at bedtime. 360 mL 0   levETIRAcetam (KEPPRA) 750 MG tablet Take 750 mg by mouth in the morning and at bedtime.     levothyroxine (SYNTHROID) 175 MCG tablet Take 1 tablet (175 mcg total) by mouth daily before  breakfast. 90 tablet 1   metoprolol tartrate (LOPRESSOR) 100 MG tablet Take 1 tablet (100 mg total) by mouth 2 (two) times daily.     mometasone (NASONEX) 50 MCG/ACT nasal spray Place 2 sprays into the nose daily. 1 each 12   Vitamin D, Ergocalciferol, (DRISDOL) 1.25 MG (50000 UNIT) CAPS capsule Take 1 capsule by mouth once a week (Patient taking differently: Take 50,000 Units by mouth every 7 (seven) days.) 12 capsule 1   furosemide (LASIX) 40 MG tablet Take 0.5 tablets (20 mg total) by mouth daily for 5 days. 30 tablet    potassium chloride (KLOR-CON M) 10 MEQ  tablet Take 1 tablet (10 mEq total) by mouth daily for 5 days. 5 tablet 0   No current facility-administered medications for this visit.    Physical Exam: Vitals:   09/04/21 1452  BP: (!) 144/81  Pulse: (!) 103  SpO2: 94%  Weight: 266 lb (120.7 kg)  Height: 5' (1.524 m)    GEN- The patient is obese, chronically ill appearing, alert and oriented x 3 today.   Head- normocephalic, atraumatic Eyes-  Sclera clear, conjunctiva pink Ears- hearing intact Oropharynx- clear Lungs-  normal work of breathing Chest- pacemaker pocket is well healed Heart- iRRR GI- soft, NT, ND, + BS Extremities- no clubbing, cyanosis, or edema  Pacemaker interrogation- reviewed in detail today,  See PACEART report    Assessment and Plan:  1. Symptomatic second degree heart block Normal pacemaker function See Pace Art report No changes today she is not device dependant today 4.9 months to ERI.  We discussed at length today Risks, benefits, and alternatives to PPM pulse generator replacement were discussed in detail today.  The patient understands that risks include but are not limited to bleeding, infection, pneumothorax, perforation, tamponade, vascular damage, renal failure, MI, stroke, death,   damage to his existing leads, and lead dislodgement and wishes to proceed once she reaches ERI. Return in 3 months for battery check.  2. Permanent afib Rate controlled Coumadin recently discontinued due to fall and SDH.  I have advised her and her son that she will need to follow closely with neurology to determine if/ when to resume anticoagulation.  Currently, not a candidate for [facility] with recent fall and SDH.  3. Obesity Body mass index is 51.95 kg/m. She has struggle with this for years, without improvement  4. HTN Stable No change required today  5. SDH As above  Risks, benefits and potential toxicities for medications prescribed and/or refilled reviewed with patient today.   Return for  battery check in 3 months  Thompson Grayer MD, [facility] 09/04/2021 3:35 PM

## 2021-09-07 ENCOUNTER — Telehealth: Payer: Self-pay

## 2021-09-07 NOTE — Telephone Encounter (Signed)
Transition Care Management Follow-up Telephone Call Date of discharge and from where: 09/03/21 - [facility] - rehab post traumatic subdural hemorrhage How have you been since you were released from the hospital? Feels no better Any questions or concerns? No  Items Reviewed: Did the pt receive and understand the discharge instructions provided? Yes  Medications obtained and verified? Yes  Other? No  Any new allergies since your discharge? No  Dietary orders reviewed? Yes Do you have support at home? Yes   Home Care and Equipment/Supplies: Were home health services ordered? yes If so, what is the name of the agency? [facility]  Has the agency set up a time to come to the patient's home? no Were any new equipment or medical supplies ordered?  Yes: Nebulizer What is the name of the medical supply agency? [facility] Were you able to get the supplies/equipment? yes Do you have any questions related to the use of the equipment or supplies? No  Functional Questionnaire: (I = Independent and D = Dependent) ADLs: D - difficulty getting around  Bathing/Dressing- D  Meal Prep- D  Eating- I  Maintaining continence- I  Transferring/Ambulation- I - using walker or wheelchair  Managing Meds- I  Follow up appointments reviewed:  PCP Hospital f/u appt confirmed? Yes  Scheduled to see DOD/ Monia Pouch  on 09/10/21 @ 1:50. [facility] f/u appt confirmed? Yes  Scheduled to see All[facility] on 6/9 @ 3. Are transportation arrangements needed? No  If their condition worsens, is the pt aware to call PCP or go to the Emergency Dept.? Yes Was the patient provided with contact information for the PCP's office or ED? Yes Was to pt encouraged to call back with questions or concerns? Yes

## 2021-09-10 ENCOUNTER — Encounter: Payer: Self-pay | Admitting: Family

## 2021-09-10 ENCOUNTER — Ambulatory Visit (INDEPENDENT_AMBULATORY_CARE_PROVIDER_SITE_OTHER): Payer: Medicare Other | Admitting: Family

## 2021-09-10 VITALS — BP 123/82 | HR 97 | Temp 97.5°F | Ht 60.0 in | Wt 262.0 lb

## 2021-09-10 DIAGNOSIS — G40909 Epilepsy, unspecified, not intractable, without status epilepticus: Secondary | ICD-10-CM | POA: Diagnosis not present

## 2021-09-10 DIAGNOSIS — S065X0A Traumatic subdural hemorrhage without loss of consciousness, initial encounter: Secondary | ICD-10-CM

## 2021-09-10 DIAGNOSIS — I10 Essential (primary) hypertension: Secondary | ICD-10-CM

## 2021-09-10 DIAGNOSIS — R531 Weakness: Secondary | ICD-10-CM | POA: Diagnosis not present

## 2021-09-10 DIAGNOSIS — Z09 Encounter for follow-up examination after completed treatment for conditions other than malignant neoplasm: Secondary | ICD-10-CM | POA: Diagnosis not present

## 2021-09-10 DIAGNOSIS — E039 Hypothyroidism, unspecified: Secondary | ICD-10-CM

## 2021-09-10 DIAGNOSIS — K219 Gastro-esophageal reflux disease without esophagitis: Secondary | ICD-10-CM | POA: Diagnosis not present

## 2021-09-10 DIAGNOSIS — I4821 Permanent atrial fibrillation: Secondary | ICD-10-CM | POA: Diagnosis not present

## 2021-09-10 DIAGNOSIS — Y92009 Unspecified place in unspecified non-institutional (private) residence as the place of occurrence of the external cause: Secondary | ICD-10-CM | POA: Diagnosis not present

## 2021-09-10 DIAGNOSIS — W19XXXS Unspecified fall, sequela: Secondary | ICD-10-CM

## 2021-09-10 NOTE — Patient Instructions (Signed)
Seizure, Adult A seizure is a sudden burst of abnormal electrical and chemical activity in the brain. Seizures usually last from 30 seconds to 2 minutes. The abnormal activity temporarily interrupts normal brain function. Many types of seizures can affect adults. A seizure can cause many different symptoms depending on where in the brain it starts. What are the causes? Common causes of this condition include: Fever or infection. Brain injury, head trauma, bleeding in the brain, or a brain tumor. Low levels of blood sugar or salt (sodium). Kidney problems or liver problems. Metabolic disorders or other conditions that are passed from parent to child (are inherited). Reaction to a substance, such as a drug or a medicine, or suddenly stopping the use of a substance (withdrawal). A stroke. Developmental disorders such as autism spectrum disorder or cerebral palsy. In some cases, the cause of a seizure may not be known. Some people who have a seizure never have another one. A person who has repeated seizures over time without a clear cause has a condition called epilepsy. What increases the risk? You are more likely to develop this condition if: You have a family history of epilepsy. You have had a tonic-clonic seizure before. This type of seizure causes tightening (contraction) of the muscles of the whole body and loss of consciousness. You have a history of head trauma, lack of oxygen at birth, or strokes. What are the signs or symptoms? There are many different types of seizures. The symptoms vary depending on the type of seizure you have. Symptoms occur during the seizure. They may also occur before a seizure (aura) and after a seizure (postictal). Symptoms may include the following: Symptoms during a seizure Uncontrollable shaking (convulsions) with fast, jerky movements of muscles. Stiffening of the body. Breathing problems. Confusion, staring, or unresponsiveness. Head nodding, eye  blinking or fluttering, or rapid eye movements. Drooling, grunting, or making clicking sounds with your mouth. Loss of bladder control and bowel control. Symptoms before a seizure Fear or anxiety. Nausea. Vertigo. This is a feeling like: You are moving when you are not. Your surroundings are moving when they are not. Dj vu. This is a feeling of having seen or heard something before. Odd tastes or smells. Changes in vision, such as seeing flashing lights or spots. Symptoms after a seizure Confusion. Sleepiness. Headache. Sore muscles. How is this diagnosed? This condition may be diagnosed based on: A description of your symptoms. Video of your seizures can be helpful. Your medical history. A physical exam. You may also have tests, including: Blood tests. CT scan. MRI. Electroencephalogram (EEG). This test measures electrical activity in the brain. An EEG can predict whether seizures will return. A spinal tap, also called a lumbar puncture. This is the removal and testing of fluid that surrounds the brain and spinal cord. How is this treated? Most seizures will stop on their own in less than 5 minutes, and no treatment is needed. Seizures that last longer than 5 minutes will usually need treatment. Seizures may be treated with: Medicines given through an IV. Avoiding known triggers, such as medicines that you take for another condition. Medicines to control seizures or prevent future seizures (antiepileptics), if epilepsy caused your seizures. Medical devices to prevent and control seizures. Surgery to stop seizures or to reduce how often seizures happen, if you have epilepsy that does not respond to medicines. A diet low in carbohydrates and high in fat (ketogenic diet). Follow these instructions at home: Medicines Take over-the-counter and prescription medicines only   as told by your health care provider. Avoid any substances that may prevent your medicine from working  properly, such as alcohol. Activity Follow instructions about activities, such as driving or swimming, that would be dangerous if you had another seizure. Wait until your health care provider says it is safe to do them. If you live in the [place] check with your local department of motor vehicles (DMV) to find out about local driving laws. Each state has specific rules about when you can legally drive again. Get enough rest. Lack of sleep can make seizures more likely to occur. Educating others  Teach friends and family what to do if you have a seizure. They should: Help you get down to the ground, to prevent a fall. Cushion your head and move items away from your body. Loosen any tight clothing around your neck. Turn you on your side. If you vomit, this helps keep your airway clear. Know whether or not you need emergency care. Stay with you until you recover. Also, tell them what not to do if you have a seizure. Tell them: They should not hold you down. Holding you down will not stop the seizure. They should not put anything in your mouth. General instructions Avoid anything that has ever triggered a seizure for you. Keep a seizure diary. Record what you remember about each seizure, especially anything that might have triggered it. Keep all follow-up visits. This is important. Contact a health care provider if: You have another seizure or seizures. Call each time you have a seizure. Your seizure pattern changes. You continue to have seizures with treatment. You have symptoms of an infection or illness. Either of these might increase your risk of having a seizure. You are unable to take your medicine. Get help right away if: You have: A seizure that does not stop after 5 minutes. Several seizures in a row without a complete recovery between seizures. A seizure that makes it harder to breathe. A seizure that leaves you unable to speak or use a part of your body. You do not wake up right  away after a seizure. You injure yourself during a seizure. You have confusion or pain right after a seizure. These symptoms may represent a serious problem that is an emergency. Do not wait to see if the symptoms will go away. Get medical help right away. Call your local emergency services (911 in the [place] Do not drive yourself to the hospital. Summary Seizures are caused by abnormal electrical and chemical activity in the brain. The activity disrupts normal brain function and can cause various symptoms. Seizures have many causes, including illness, head injuries, low levels of blood sugar or salt, and certain conditions. Most seizures will stop on their own in less than 5 minutes. Seizures that last longer than 5 minutes are a medical emergency and need treatment right away. Many medicines are used to treat seizures. Take over-the-counter and prescription medicines only as told by your health care provider. This information is not intended to replace advice given to you by your health care provider. Make sure you discuss any questions you have with your health care provider. Document Revised: 09/21/2019 Document Reviewed: 09/21/2019 Elsevier Patient Education  2023 Elsevier Inc.  

## 2021-09-10 NOTE — Progress Notes (Signed)
Subjective:    Patient ID: Megan [name], female    DOB: 01/05/1946, 76 y.o.   MRN: [id number]  Chief Complaint  Patient presents with   Hospitalization Follow-up    For fall and possible stroke. Has been in rehab.     Today's visit was for Transitional Care Management.  The patient was discharged from [facility] on 09/03/21 with a primary diagnosis of post traumatic subdural hemorrhage.   Contact with the patient and/or caregiver, by a clinical staff member, was made on 09/07/21 and was documented as a telephone encounter within the EMR.  Through chart review and discussion with the patient I have determined that management of their condition is of high complexity.    Pt went to the hospital on 08/15/21 after a fall and hitting her head. She was diagnosed with a subdural hematoma and currently taking warfarin. She was discharged from  08/21/21. Her omeprazole and warfarin was discontinued. She was discharged to Rehab. She was discharged from [facility] 09/03/21. Her sons is staying with her 24 hours a day.   She was diagnosed with seizures and started on Keppra and Protonix 40 mg.  Gastroesophageal Reflux She complains of belching and heartburn. She reports no hoarse voice. This is a chronic problem. The current episode started more than 1 year ago. The problem occurs occasionally. The problem has been waxing and waning. Associated symptoms include fatigue. She has tried a PPI for the symptoms. The treatment provided moderate relief.  Thyroid Problem Presents for follow-up visit. Symptoms include dry skin and fatigue. Patient reports no constipation or hoarse voice. The symptoms have been stable.      Review of Systems  Constitutional:  Positive for fatigue.  HENT:  Negative for hoarse voice.   Gastrointestinal:  Positive for heartburn. Negative for constipation.  All other systems reviewed and are negative.  Family History  Problem Relation Age of Onset   Parkinson's disease  Other    CVA Other    Parkinson's disease Father    Social History   Socioeconomic History   Marital status: Widowed    Spouse name: Not on file   Number of children: 1   Years of education: Not on file   Highest education level: High school graduate  Occupational History   Occupation: RETIRED    Employer: RETIRED  Tobacco Use   Smoking status: Former    Packs/day: 0.50    Years: 10.00    Total pack years: 5.00    Types: Cigarettes    Quit date: 03/29/1978    Years since quitting: 43.4   Smokeless tobacco: Never  Vaping Use   Vaping Use: Never used  Substance and Sexual Activity   Alcohol use: No    Alcohol/week: 0.0 standard drinks of alcohol   Drug use: No   Sexual activity: Not Currently    Birth control/protection: None  Other Topics Concern   Not on file  Social History Narrative   Not on file   Social Determinants of Health   Financial Resource Strain: Low Risk  (10/18/2018)   Overall Financial Resource Strain (CARDIA)    Difficulty of Paying Living Expenses: Not hard at all  Food Insecurity: No Food Insecurity (10/18/2018)   Hunger Vital Sign    Worried About Running Out of Food in the Last Year: Never true    Ran Out of Food in the Last Year: Never true  Transportation Needs: No Transportation Needs (10/18/2018)   PRAPARE - Transportation  Lack of Transportation (Medical): No    Lack of Transportation (Non-Medical): No  Physical Activity: Inactive (10/18/2018)   Exercise Vital Sign    Days of Exercise per Week: 0 days    Minutes of Exercise per Session: 0 min  Stress: No Stress Concern Present (10/18/2018)   [place]    Feeling of Stress : Not at all  Social Connections: Unknown (10/18/2018)   Social Connection and Isolation Panel [NHANES]    Frequency of Communication with Friends and Family: More than three times a week    Frequency of Social Gatherings with Friends and Family: More  than three times a week    Attends Religious Services: Not on file    Active Member of Clubs or Organizations: Not on file    Attends Archivist Meetings: Not on file    Marital Status: Widowed        Objective:   Physical Exam Vitals reviewed.  Constitutional:      General: She is not in acute distress.    Appearance: She is well-developed. She is obese.  HENT:     Head: Normocephalic and atraumatic.     Right Ear: Tympanic membrane normal.     Left Ear: Tympanic membrane normal.  Eyes:     Pupils: Pupils are equal, round, and reactive to light.  Neck:     Thyroid: No thyromegaly.  Cardiovascular:     Rate and Rhythm: Normal rate and regular rhythm.     Heart sounds: Normal heart sounds. No murmur heard. Pulmonary:     Effort: Pulmonary effort is normal. No respiratory distress.     Breath sounds: Normal breath sounds. No wheezing.  Abdominal:     General: Bowel sounds are normal. There is no distension.     Palpations: Abdomen is soft.     Tenderness: There is no abdominal tenderness.  Musculoskeletal:        General: No tenderness.     Cervical back: Normal range of motion and neck supple.     Right lower leg: Edema (2+) present.     Left lower leg: Edema (2+) present.  Skin:    General: Skin is warm and dry.  Neurological:     Mental Status: She is alert and oriented to person, place, and time.     Cranial Nerves: No cranial nerve deficit.     Motor: Weakness present.     Gait: Gait abnormal.     Deep Tendon Reflexes: Reflexes are normal and symmetric.  Psychiatric:        Behavior: Behavior normal.        Thought Content: Thought content normal.        Judgment: Judgment normal.     BP 123/82   Pulse 97   Temp (!) 97.5 F (36.4 C)   Ht 5' (1.524 m)   Wt 262 lb (118.8 kg)   SpO2 94%   BMI 51.17 kg/m      Assessment & Plan:  Megan [name] comes in today with chief complaint of Hospitalization Follow-up (For fall and possible stroke. Has  been in rehab. )   Diagnosis and orders addressed:  1. Permanent atrial fibrillation (HCC) - CMP14+EGFR - CBC with Differential/Platelet  2. HYPERTENSION, BENIGN - CMP14+EGFR - CBC with Differential/Platelet  3. Gastroesophageal reflux disease without esophagitis - CMP14+EGFR - CBC with Differential/Platelet  4. Morbid obesity due to excess calories (HCC) - CMP14+EGFR - CBC with  Differential/Platelet  5. Generalized weakness - CMP14+EGFR - CBC with Differential/Platelet  6. Hospital discharge follow-up - CMP14+EGFR - CBC with Differential/Platelet  7. Seizure disorder ([place]) - Ambulatory referral to Neurology - CMP14+EGFR - CBC with Differential/Platelet  8. Fall in home, sequela - CMP14+EGFR - CBC with Differential/Platelet  9. Acquired hypothyroidism - CMP14+EGFR - CBC with Differential/Platelet - TSH   Labs pending Will do referral to Neurologists to follow up on keppra Keep follow up with Cardiologists  Health Maintenance reviewed Diet and exercise encouraged  Follow up plan: 2 months    Evelina Dun, FNP

## 2021-09-11 LAB — CMP14+EGFR
ALT: 25 IU/L (ref 0–32)
AST: 27 IU/L (ref 0–40)
Albumin/Globulin Ratio: 1.4 (ref 1.2–2.2)
Albumin: 3.6 g/dL — ABNORMAL LOW (ref 3.7–4.7)
Alkaline Phosphatase: 186 IU/L — ABNORMAL HIGH (ref 44–121)
BUN/Creatinine Ratio: 9 — ABNORMAL LOW (ref 12–28)
BUN: 8 mg/dL (ref 8–27)
Bilirubin Total: 1.6 mg/dL — ABNORMAL HIGH (ref 0.0–1.2)
CO2: 18 mmol/L — ABNORMAL LOW (ref 20–29)
Calcium: 9 mg/dL (ref 8.7–10.3)
Chloride: 103 mmol/L (ref 96–106)
Creatinine, Ser: 0.87 mg/dL (ref 0.57–1.00)
Globulin, Total: 2.5 g/dL (ref 1.5–4.5)
Glucose: 127 mg/dL — ABNORMAL HIGH (ref 70–99)
Potassium: 4.5 mmol/L (ref 3.5–5.2)
Sodium: 140 mmol/L (ref 134–144)
Total Protein: 6.1 g/dL (ref 6.0–8.5)
eGFR: 69 mL/min/{1.73_m2} (ref >59)

## 2021-09-11 LAB — CBC WITH DIFFERENTIAL/PLATELET
Basophils Absolute: 0.1 10*3/uL (ref 0.0–0.2)
Basos: 1 %
EOS (ABSOLUTE): 0.1 10*3/uL (ref 0.0–0.4)
Eos: 1 %
Hematocrit: 41 % (ref 34.0–46.6)
Hemoglobin: 13.6 g/dL (ref 11.1–15.9)
Immature Grans (Abs): 0 10*3/uL (ref 0.0–0.1)
Immature Granulocytes: 0 %
Lymphocytes Absolute: 1 10*3/uL (ref 0.7–3.1)
Lymphs: 10 %
MCH: 31.7 pg (ref 26.6–33.0)
MCHC: 33.2 g/dL (ref 31.5–35.7)
MCV: 96 fL (ref 79–97)
Monocytes Absolute: 0.7 10*3/uL (ref 0.1–0.9)
Monocytes: 7 %
Neutrophils Absolute: 8.2 10*3/uL — ABNORMAL HIGH (ref 1.4–7.0)
Neutrophils: 81 %
Platelets: 203 10*3/uL (ref 150–450)
RBC: 4.29 x10E6/uL (ref 3.77–5.28)
RDW: 13.9 % (ref 11.7–15.4)
WBC: 10.1 10*3/uL (ref 3.4–10.8)

## 2021-09-11 LAB — TSH: TSH: 0.386 u[IU]/mL — ABNORMAL LOW (ref 0.450–4.500)

## 2021-09-14 ENCOUNTER — Telehealth: Payer: Self-pay | Admitting: Family

## 2021-09-14 NOTE — Telephone Encounter (Signed)
Patient aware that labs have not been reviewed by provider. Will call back with recommendations once provider reviews.

## 2021-09-21 ENCOUNTER — Other Ambulatory Visit: Payer: Self-pay | Admitting: Family

## 2021-09-21 MED ORDER — LEVOTHYROXINE SODIUM 150 MCG PO TABS: 150.0000 ug | ORAL_TABLET | Freq: Every day | ORAL | 11 refills | Status: DC

## 2021-09-26 DIAGNOSIS — R5381 Other malaise: Secondary | ICD-10-CM | POA: Diagnosis not present

## 2021-09-26 DIAGNOSIS — R69 Illness, unspecified: Secondary | ICD-10-CM | POA: Diagnosis not present

## 2021-09-28 DIAGNOSIS — Z7901 Long term (current) use of anticoagulants: Secondary | ICD-10-CM | POA: Diagnosis not present

## 2021-09-28 DIAGNOSIS — Z87891 Personal history of nicotine dependence: Secondary | ICD-10-CM | POA: Diagnosis not present

## 2021-09-28 DIAGNOSIS — Z7401 Bed confinement status: Secondary | ICD-10-CM | POA: Diagnosis not present

## 2021-09-28 DIAGNOSIS — E161 Other hypoglycemia: Secondary | ICD-10-CM | POA: Diagnosis not present

## 2021-09-28 DIAGNOSIS — Z79890 Hormone replacement therapy (postmenopausal): Secondary | ICD-10-CM | POA: Diagnosis not present

## 2021-09-28 DIAGNOSIS — I10 Essential (primary) hypertension: Secondary | ICD-10-CM | POA: Diagnosis not present

## 2021-09-28 DIAGNOSIS — E162 Hypoglycemia, unspecified: Secondary | ICD-10-CM | POA: Diagnosis not present

## 2021-09-28 DIAGNOSIS — I4891 Unspecified atrial fibrillation: Secondary | ICD-10-CM | POA: Diagnosis not present

## 2021-09-28 DIAGNOSIS — R410 Disorientation, unspecified: Secondary | ICD-10-CM | POA: Diagnosis not present

## 2021-09-28 DIAGNOSIS — W19XXXA Unspecified fall, initial encounter: Secondary | ICD-10-CM | POA: Diagnosis not present

## 2021-09-28 DIAGNOSIS — E785 Hyperlipidemia, unspecified: Secondary | ICD-10-CM | POA: Diagnosis not present

## 2021-09-28 DIAGNOSIS — E079 Disorder of thyroid, unspecified: Secondary | ICD-10-CM | POA: Diagnosis not present

## 2021-09-28 DIAGNOSIS — E119 Type 2 diabetes mellitus without complications: Secondary | ICD-10-CM | POA: Diagnosis not present

## 2021-09-28 DIAGNOSIS — R413 Other amnesia: Secondary | ICD-10-CM | POA: Diagnosis not present

## 2021-09-28 DIAGNOSIS — Z20822 Contact with and (suspected) exposure to covid-19: Secondary | ICD-10-CM | POA: Diagnosis not present

## 2021-10-02 ENCOUNTER — Encounter: Payer: Self-pay | Admitting: *Deleted

## 2021-10-09 ENCOUNTER — Telehealth: Payer: Self-pay | Admitting: Internal Medicine

## 2021-10-09 NOTE — Telephone Encounter (Signed)
Returned call to patient. Patient states she was recently in the hospital and was told to schedule a bone density test. Advised patient her PCP could schedule this when she has her post hospital follow up visit.  Patient verbalized understanding.

## 2021-10-09 NOTE — Telephone Encounter (Signed)
Patient calling in bout a letter she recvd in the mail bout bone density test. Please advise

## 2021-10-21 ENCOUNTER — Telehealth: Payer: Self-pay

## 2021-10-21 ENCOUNTER — Ambulatory Visit (INDEPENDENT_AMBULATORY_CARE_PROVIDER_SITE_OTHER): Payer: Medicare Other

## 2021-10-21 VITALS — Wt 262.0 lb

## 2021-10-21 DIAGNOSIS — Z5982 Transportation insecurity: Secondary | ICD-10-CM

## 2021-10-21 DIAGNOSIS — Z0000 Encounter for general adult medical examination without abnormal findings: Secondary | ICD-10-CM | POA: Diagnosis not present

## 2021-10-21 NOTE — Telephone Encounter (Signed)
   Telephone encounter was:  Successful.  10/21/2021 Name: Megan [name] MRN: [id number] DOB: March 15, 1946  Megan [name] is a 76 y.o. year old female who is a primary care patient of Sharion Balloon, FNP . The community resource team was consulted for assistance with Transportation Needs   Care guide performed the following interventions: Patient provided with information about care guide support team and interviewed to confirm resource needs. Patient advised she needs two people to get her in and out of home. She has about five steps outside her home and can not get up or down. She feels she may need three  people really as she stated the doors are narrow as well. Her next appt is 7/31. Patient feels she needs the ambulance service. [facility] has sent a request for pt to have assessment completed for placement/qualifies for caregiver.  Follow Up Plan:  Care guide will outreach resources to assist patient with transportation  [place], [place] management  [place], [place]  Main Phone: [phone]  E-mail: [email or web address]  Website: [email or web address]

## 2021-10-21 NOTE — Progress Notes (Signed)
Subjective:   Megan [name] is a 76 y.o. female who presents for Medicare Annual (Subsequent) preventive examination.  Virtual Visit via Telephone Note  I connected with  Megan [name] on 10/21/21 at  9:00 AM EDT by telephone and verified that I am speaking with the correct person using two identifiers.  Location: Patient: Home Provider: [facility] Persons participating in the virtual visit: patient/Nurse Health Advisor   I discussed the limitations, risks, security and privacy concerns of performing an evaluation and management service by telephone and the availability of in person appointments. The patient expressed understanding and agreed to proceed.  Interactive audio and video telecommunications were attempted between this nurse and patient, however failed, due to patient having technical difficulties OR patient did not have access to video capability.  We continued and completed visit with audio only.  Some vital signs may be absent or patient reported.   Megan [name] E Megan Holm, LPN   Review of Systems     Cardiac Risk Factors include: advanced age (>61mn, >>60women);obesity (BMI >30kg/m2);sedentary lifestyle;dyslipidemia;hypertension;Other (see comment), Risk factor comments: CHF, A.FIB, PVD     Objective:    Today's Vitals   10/21/21 0900  Weight: 262 lb (118.8 kg)   Body mass index is 51.17 kg/m.     10/21/2021    9:12 AM 06/12/2021    4:42 PM 05/17/2021   10:00 PM 05/17/2021    7:40 PM 05/17/2021    3:37 PM 01/20/2021    6:09 AM 10/20/2020    9:14 AM  Advanced Directives  Does Patient Have a Medical Advance Directive? No No No No No No No  Would patient like information on creating a medical advance directive? No - Patient declined No - Patient declined No - Patient declined No - Patient declined  No - Patient declined No - Patient declined    Current Medications (verified) Outpatient Encounter Medications as of 10/21/2021  Medication Sig   albuterol (VENTOLIN HFA)  108 (90 Base) MCG/ACT inhaler INHALE 2 PUFFS BY MOUTH EVERY 6 HOURS AS NEEDED FOR WHEEZING OR SHORTNESS OF BREATH (Patient taking differently: Inhale 2 puffs into the lungs every 6 (six) hours as needed for shortness of breath.)   atorvastatin (LIPITOR) 80 MG tablet Take 1 tablet by mouth once daily (Patient taking differently: Take 80 mg by mouth daily.)   blood glucose meter kit and supplies Dispense based on patient and insurance preference. Use up to four times daily as directed. (FOR ICD-10 E10.9, E11.9).   clotrimazole-betamethasone (LOTRISONE) cream APPLY  CREAM TOPICALLY TO AFFECTED AREA TWICE DAILY (Patient taking differently: Apply 1 application  topically 2 (two) times daily.)   diclofenac Sodium (VOLTAREN) 1 % GEL Apply 4 g topically in the morning, at noon, and at bedtime.   diltiazem (CARDIZEM CD) 240 MG 24 hr capsule Take 1 capsule (240 mg total) by mouth daily.   esomeprazole (NEXIUM) 40 MG capsule Take 1 capsule (40 mg total) by mouth daily.   ipratropium-albuterol (DUONEB) 0.5-2.5 (3) MG/3ML SOLN Take 3 mLs by nebulization in the morning and at bedtime.   levETIRAcetam (KEPPRA) 750 MG tablet Take 750 mg by mouth in the morning and at bedtime.   levothyroxine (SYNTHROID) 150 MCG tablet Take 1 tablet (150 mcg total) by mouth daily.   metoprolol tartrate (LOPRESSOR) 100 MG tablet Take 1 tablet (100 mg total) by mouth 2 (two) times daily.   mometasone (NASONEX) 50 MCG/ACT nasal spray Place 2 sprays into the nose daily.   Vitamin  D, Ergocalciferol, (DRISDOL) 1.25 MG (50000 UNIT) CAPS capsule Take 1 capsule by mouth once a week (Patient taking differently: Take 50,000 Units by mouth every 7 (seven) days.)   [DISCONTINUED] furosemide (LASIX) 40 MG tablet Take 0.5 tablets (20 mg total) by mouth daily for 5 days.   [DISCONTINUED] potassium chloride (KLOR-CON M) 10 MEQ tablet Take 1 tablet (10 mEq total) by mouth daily for 5 days.   No facility-administered encounter medications on file as of  10/21/2021.    Allergies (verified) Patient has no known allergies.   History: Past Medical History:  Diagnosis Date   Acute diastolic heart failure (HCC)    Anxiety state, unspecified    Asthma    Atrial fibrillation (HCC)    Congestive heart failure, unspecified    Degenerative joint disease    GERD (gastroesophageal reflux disease)    Hyperlipidemia    Hypertension    Lymphedema    Obesity    OSA (obstructive sleep apnea) 06/09/2015   Persistent atrial fibrillation (HCC)     post-termination pauses with afib s/p PPM   Presence of permanent cardiac pacemaker    Sinoatrial node dysfunction ([place])    s/p PPM   Sleep apnea    compliant with CPAP   Unspecified hypothyroidism    Unspecified venous (peripheral) insufficiency    Past Surgical History:  Procedure Laterality Date   CATARACT EXTRACTION W/PHACO Left 09/09/2014   Procedure: CATARACT EXTRACTION PHACO AND INTRAOCULAR LENS PLACEMENT ([place]);  Surgeon: Tonny Branch, MD;  Location: [facility];  Service: Ophthalmology;  Laterality: Left;  CDE: 6.99   CATARACT EXTRACTION W/PHACO Right 10/07/2014   Procedure: CATARACT EXTRACTION PHACO AND INTRAOCULAR LENS PLACEMENT RIGHT EYE;  Surgeon: Tonny Branch, MD;  Location: [facility];  Service: Ophthalmology;  Laterality: Right;  CDE:5.16   PACEMAKER INSERTION     SJM by JA   Family History  Problem Relation Age of Onset   Parkinson's disease Other    CVA Other    Parkinson's disease Father    Social History   Socioeconomic History   Marital status: Widowed    Spouse name: Not on file   Number of children: 1   Years of education: Not on file   Highest education level: High school graduate  Occupational History   Occupation: RETIRED    Employer: RETIRED  Tobacco Use   Smoking status: Former    Packs/day: 0.50    Years: 10.00    Total pack years: 5.00    Types: Cigarettes    Quit date: 03/29/1978    Years since quitting: 43.5   Smokeless tobacco: Never  Vaping Use   Vaping Use:  Never used  Substance and Sexual Activity   Alcohol use: No    Alcohol/week: 0.0 standard drinks of alcohol   Drug use: No   Sexual activity: Not Currently    Birth control/protection: None  Other Topics Concern   Not on file  Social History Narrative   Her son lives with her and helps her a lot   She can barely walk   Social Determinants of Health   Financial Resource Strain: Low Risk  (10/21/2021)   Overall Financial Resource Strain (CARDIA)    Difficulty of Paying Living Expenses: Not hard at all  Food Insecurity: No Food Insecurity (10/21/2021)   Hunger Vital Sign    Worried About Running Out of Food in the Last Year: Never true    [place] in the Last Year: Never true  Transportation Needs: No Transportation Needs (10/21/2021)   PRAPARE - Hydrologist (Medical): No    Lack of Transportation (Non-Medical): No  Physical Activity: Inactive (10/21/2021)   Exercise Vital Sign    Days of Exercise per Week: 0 days    Minutes of Exercise per Session: 0 min  Stress: No Stress Concern Present (10/21/2021)   [place]    Feeling of Stress : Only a little  Social Connections: Socially Isolated (10/21/2021)   Social Connection and Isolation Panel [NHANES]    Frequency of Communication with Friends and Family: More than three times a week    Frequency of Social Gatherings with Friends and Family: Twice a week    Attends Religious Services: Never    Marine scientist or Organizations: No    Attends Archivist Meetings: Never    Marital Status: Widowed    Tobacco Counseling Counseling given: Not Answered   Clinical Intake:     Pain : No/denies pain     BMI - recorded: 51.17 Nutritional Status: BMI > 30  Obese Nutritional Risks: None Diabetes: No  How often do you need to have someone help you when you read instructions, pamphlets, or other written materials  from your doctor or pharmacy?: 1 - Never  Diabetic? no  Interpreter Needed?: No  Information entered by :: Megan Rodenberg, LPN   Activities of Daily Living    10/21/2021    9:12 AM 06/12/2021    4:42 PM  In your present state of health, do you have any difficulty performing the following activities:  Hearing? 1 0  Vision? 0 0  Difficulty concentrating or making decisions? 1 0  Walking or climbing stairs? 1 1  Dressing or bathing? 1 1  Doing errands, shopping? 1 1  Preparing Food and eating ? Y   Using the Toilet? N   In the past six months, have you accidently leaked urine? N   Do you have problems with loss of bowel control? N   Managing your Medications? N   Managing your Finances? N   Housekeeping or managing your Housekeeping? Y     Patient Care Team: Megan Balloon, FNP as PCP - General (Family Medicine) Megan Grayer, MD as PCP - Electrophysiology (Cardiology)  Indicate any recent Medical Services you may have received from other than [facility] providers in the past year (date may be approximate).     Assessment:   This is a routine wellness examination for [place].  Hearing/Vision screen Hearing Screening - Comments:: C/o mild hearing difficulties  - declines hearing aids Vision Screening - Comments:: Wears rx glasses - behind with routine eye exams with [facility]  Dietary issues and exercise activities discussed: Current Exercise Habits: The patient does not participate in regular exercise at present, Exercise limited by: neurologic condition(s);orthopedic condition(s);cardiac condition(s);psychological condition(s)   Goals Addressed             This Visit's Progress    Prevent falls   Not on track    Try to lose weight, get better so she can stand, walk, get around and be more independent Stay out of the hospital       Depression Screen    10/21/2021    9:10 AM 09/10/2021    1:58 PM 10/20/2020    9:14 AM 10/07/2020    9:26 AM 08/14/2020   11:37 AM  06/09/2020   11:01 AM 02/12/2020  8:47 AM  PHQ 2/9 Scores  PHQ - 2 Score '6 6 1 1 ' 0 0 0  PHQ- 9 Score '24 24  6 ' 0      Fall Risk    10/21/2021    9:02 AM 09/10/2021    1:58 PM 04/02/2021    2:42 PM 10/20/2020    9:14 AM 10/07/2020    9:19 AM  Fall Risk   Falls in the past year? '1 1 1 ' 0 0  Number falls in past yr: '1 1 1    ' Injury with Fall? '1 1 1    ' Comment  hit head     Risk for fall due to : History of fall(s);Impaired balance/gait;Orthopedic patient;Impaired mobility Impaired balance/gait History of fall(s)    Follow up Education provided;Falls prevention discussed Falls evaluation completed Education provided      FALL RISK PREVENTION PERTAINING TO THE HOME:  Any stairs in or around the home? No  If so, are there any without handrails? No  Home free of loose throw rugs in walkways, pet beds, electrical cords, etc? Yes  Adequate lighting in your home to reduce risk of falls? Yes   ASSISTIVE DEVICES UTILIZED TO PREVENT FALLS:  Life alert? No  Use of a cane, walker or w/c? Yes  Grab bars in the bathroom? Yes  Shower chair or bench in shower? Yes  Elevated toilet seat or a handicapped toilet? Yes   TIMED UP AND GO:  Was the test performed? No . Telephonic visit  Cognitive Function:        10/21/2021    9:14 AM 10/20/2020    9:15 AM 10/19/2019    9:31 AM 10/18/2018    9:53 AM  6CIT Screen  What Year? 0 points 0 points 0 points 0 points  What month? 0 points 0 points 0 points 0 points  What time? 0 points 0 points 0 points 0 points  Count back from 20 0 points 0 points 0 points 0 points  Months in reverse 0 points 0 points 0 points 0 points  Repeat phrase 4 points 2 points 0 points 0 points  Total Score 4 points 2 points 0 points 0 points    Immunizations Immunization History  Administered Date(s) Administered   Fluad Quad(high Dose 65+) 12/18/2019, 02/02/2021   Influenza, High Dose Seasonal PF 01/26/2017, 01/12/2018   Influenza,inj,Quad PF,6+ Mos 01/01/2016,  12/28/2018   Janssen (J&J) SARS-COV-2 Vaccination 07/12/2019   Pneumococcal Conjugate-13 12/22/2012, 01/12/2018   Pneumococcal Polysaccharide-23 12/06/2007, 02/12/2020   Pneumococcal-Unspecified 12/22/2012   Td 09/13/2018   Tdap 09/13/2018    TDAP status: Up to date  Flu Vaccine status: Up to date  Pneumococcal vaccine status: Up to date  Covid-19 vaccine status: Declined, Education has been provided regarding the importance of this vaccine but patient still declined. Advised may receive this vaccine at local pharmacy or Health Dept.or vaccine clinic. Aware to provide a copy of the vaccination record if obtained from local pharmacy or Health Dept. Verbalized acceptance and understanding.  Qualifies for Shingles Vaccine? Yes   Zostavax completed No   Shingrix Completed?: No.    Education has been provided regarding the importance of this vaccine. Patient has been advised to call insurance company to determine out of pocket expense if they have not yet received this vaccine. Advised may also receive vaccine at local pharmacy or Health Dept. Verbalized acceptance and understanding.  Screening Tests Health Maintenance  Topic Date Due   OPHTHALMOLOGY EXAM  Never done  Zoster Vaccines- Shingrix (1 of 2) Never done   COVID-19 Vaccine (2 - Booster for [employer] series) 09/06/2019   FOOT EXAM  10/07/2021   INFLUENZA VACCINE  10/27/2021   HEMOGLOBIN A1C  11/16/2021   TETANUS/TDAP  09/12/2028   Pneumonia Vaccine 66+ Years old  Completed   DEXA SCAN  Completed   Hepatitis C Screening  Completed   HPV VACCINES  Aged Out   COLONOSCOPY (Pts 45-45yr Insurance coverage will need to be confirmed)  Discontinued    Health Maintenance  Health Maintenance Due  Topic Date Due   OPHTHALMOLOGY EXAM  Never done   Zoster Vaccines- Shingrix (1 of 2) Never done   COVID-19 Vaccine (2 - Booster for Janssen series) 09/06/2019   FOOT EXAM  10/07/2021    Colorectal cancer screening: No longer required.    Mammogram status: No longer required due to declines.  DECLINES DEXA  Lung Cancer Screening: (Low Dose CT Chest recommended if Age 77-80years, 30 pack-year currently smoking OR have quit w/in 15years.) does not qualify.   Additional Screening:  Hepatitis C Screening: does qualify; Completed 02/12/2020  Vision Screening: Recommended annual ophthalmology exams for early detection of glaucoma and other disorders of the eye. Is the patient up to date with their annual eye exam?  No  Who is the provider or what is the name of the office in which the patient attends annual eye exams? [facility] LuisIf pt is not established with a provider, would they like to be referred to a provider to establish care? No .   Dental Screening: Recommended annual dental exams for proper oral hygiene  Community Resource Referral / Chronic Care Management: CRR required this visit?  No   CCM required this visit?  No      Plan:     I have personally reviewed and noted the following in the patient's chart:   Medical and social history Use of alcohol, tobacco or illicit drugs  Current medications and supplements including opioid prescriptions.  Functional ability and status Nutritional status Physical activity Advanced directives List of other physicians Hospitalizations, surgeries, and ER visits in previous 12 months Vitals Screenings to include cognitive, depression, and falls Referrals and appointments  In addition, I have reviewed and discussed with patient certain preventive protocols, quality metrics, and best practice recommendations. A written personalized care plan for preventive services as well as general preventive health recommendations were provided to patient.     ASandrea Hammond LPN   [id number]  Nurse Notes: She is barely able to stand, can't walk, can't get out of the house to do anything - sent CRR to see if they can help with this - transportation assistance - but someone  would need to come in home to get her and bring her back into home - I'm not sure if this is available.

## 2021-10-21 NOTE — Patient Instructions (Signed)
Ms. Crigler , Thank you for taking time to come for your Medicare Wellness Visit. I appreciate your ongoing commitment to your health goals. Please review the following plan we discussed and let me know if I can assist you in the future.   Screening recommendations/referrals: Colonoscopy: Done 07/10/2010 - no repeat Mammogram: Done 09/15/2016 - declined repeat - recommended every year Bone Density: Done 06/04/2015 - declines repeat - recommended every 2 years Recommended yearly ophthalmology/optometry visit for glaucoma screening and checkup Recommended yearly dental visit for hygiene and checkup  Vaccinations: Influenza vaccine: Done 02/02/2021 - Repeat annually  Pneumococcal vaccine: Done  12/06/2007, 12/22/2012, 01/12/2018, 02/12/2020 Tdap vaccine: Done 09/13/2018 - Repeat in 10 yearsD  Shingles vaccine: Due - Shingrix is 2 doses 2-6 months apart and over 90% effective     Covid-19:Done 07/12/2019 Alphonsa Overall) - declines boosters  Advanced directives: Advance directive discussed with you today. Even though you declined this today, please call our office should you change your mind, and we can give you the proper paperwork for you to fill out.   Conditions/risks identified: Aim for 4-6 glasses of water, plenty of protein in your diet and try to get up and walk/ stretch every hour for 5-10 minutes at a time.   Next appointment: Follow up in one year for your annual wellness visit    Preventive Care 65 Years and Older, Female Preventive care refers to lifestyle choices and visits with your health care provider that can promote health and wellness. What does preventive care include? A yearly physical exam. This is also called an annual well check. Dental exams once or twice a year. Routine eye exams. Ask your health care provider how often you should have your eyes checked. Personal lifestyle choices, including: Daily care of your teeth and gums. Regular physical activity. Eating a healthy  diet. Avoiding tobacco and drug use. Limiting alcohol use. Practicing safe sex. Taking low-dose aspirin every day. Taking vitamin and mineral supplements as recommended by your health care provider. What happens during an annual well check? The services and screenings done by your health care provider during your annual well check will depend on your age, overall health, lifestyle risk factors, and family history of disease. Counseling  Your health care provider may ask you questions about your: Alcohol use. Tobacco use. Drug use. Emotional well-being. Home and relationship well-being. Sexual activity. Eating habits. History of falls. Memory and ability to understand (cognition). Work and work Statistician. Reproductive health. Screening  You may have the following tests or measurements: Height, weight, and BMI. Blood pressure. Lipid and cholesterol levels. These may be checked every 5 years, or more frequently if you are over 22 years old. Skin check. Lung cancer screening. You may have this screening every year starting at age 70 if you have a 30-pack-year history of smoking and currently smoke or have quit within the past 15 years. Fecal occult blood test (FOBT) of the stool. You may have this test every year starting at age 55. Flexible sigmoidoscopy or colonoscopy. You may have a sigmoidoscopy every 5 years or a colonoscopy every 10 years starting at age 8. Hepatitis C blood test. Hepatitis B blood test. Sexually transmitted disease (STD) testing. Diabetes screening. This is done by checking your blood sugar (glucose) after you have not eaten for a while (fasting). You may have this done every 1-3 years. Bone density scan. This is done to screen for osteoporosis. You may have this done starting at age 45. Mammogram. This may  be done every 1-2 years. Talk to your health care provider about how often you should have regular mammograms. Talk with your health care provider about  your test results, treatment options, and if necessary, the need for more tests. Vaccines  Your health care provider may recommend certain vaccines, such as: Influenza vaccine. This is recommended every year. Tetanus, diphtheria, and acellular pertussis (Tdap, Td) vaccine. You may need a Td booster every 10 years. Zoster vaccine. You may need this after age 35. Pneumococcal 13-valent conjugate (PCV13) vaccine. One dose is recommended after age 30. Pneumococcal polysaccharide (PPSV23) vaccine. One dose is recommended after age 80. Talk to your health care provider about which screenings and vaccines you need and how often you need them. This information is not intended to replace advice given to you by your health care provider. Make sure you discuss any questions you have with your health care provider. Document Released: 04/11/2015 Document Revised: 12/03/2015 Document Reviewed: 01/14/2015 Elsevier Interactive Patient Education  2017 [place] Prevention in the Home Falls can cause injuries. They can happen to people of all ages. There are many things you can do to make your home safe and to help prevent falls. What can I do on the outside of my home? Regularly fix the edges of walkways and driveways and fix any cracks. Remove anything that might make you trip as you walk through a door, such as a raised step or threshold. Trim any bushes or trees on the path to your home. Use bright outdoor lighting. Clear any walking paths of anything that might make someone trip, such as rocks or tools. Regularly check to see if handrails are loose or broken. Make sure that both sides of any steps have handrails. Any raised decks and porches should have guardrails on the edges. Have any leaves, snow, or ice cleared regularly. Use sand or salt on walking paths during winter. Clean up any spills in your garage right away. This includes oil or grease spills. What can I do in the bathroom? Use  night lights. Install grab bars by the toilet and in the tub and shower. Do not use towel bars as grab bars. Use non-skid mats or decals in the tub or shower. If you need to sit down in the shower, use a plastic, non-slip stool. Keep the floor dry. Clean up any water that spills on the floor as soon as it happens. Remove soap buildup in the tub or shower regularly. Attach bath mats securely with double-sided non-slip rug tape. Do not have throw rugs and other things on the floor that can make you trip. What can I do in the bedroom? Use night lights. Make sure that you have a light by your bed that is easy to reach. Do not use any sheets or blankets that are too big for your bed. They should not hang down onto the floor. Have a firm chair that has side arms. You can use this for support while you get dressed. Do not have throw rugs and other things on the floor that can make you trip. What can I do in the kitchen? Clean up any spills right away. Avoid walking on wet floors. Keep items that you use a lot in easy-to-reach places. If you need to reach something above you, use a strong step stool that has a grab bar. Keep electrical cords out of the way. Do not use floor polish or wax that makes floors slippery. If you must use  wax, use non-skid floor wax. Do not have throw rugs and other things on the floor that can make you trip. What can I do with my stairs? Do not leave any items on the stairs. Make sure that there are handrails on both sides of the stairs and use them. Fix handrails that are broken or loose. Make sure that handrails are as long as the stairways. Check any carpeting to make sure that it is firmly attached to the stairs. Fix any carpet that is loose or worn. Avoid having throw rugs at the top or bottom of the stairs. If you do have throw rugs, attach them to the floor with carpet tape. Make sure that you have a light switch at the top of the stairs and the bottom of the  stairs. If you do not have them, ask someone to add them for you. What else can I do to help prevent falls? Wear shoes that: Do not have high heels. Have rubber bottoms. Are comfortable and fit you well. Are closed at the toe. Do not wear sandals. If you use a stepladder: Make sure that it is fully opened. Do not climb a closed stepladder. Make sure that both sides of the stepladder are locked into place. Ask someone to hold it for you, if possible. Clearly mark and make sure that you can see: Any grab bars or handrails. First and last steps. Where the edge of each step is. Use tools that help you move around (mobility aids) if they are needed. These include: Canes. Walkers. Scooters. Crutches. Turn on the lights when you go into a dark area. Replace any light bulbs as soon as they burn out. Set up your furniture so you have a clear path. Avoid moving your furniture around. If any of your floors are uneven, fix them. If there are any pets around you, be aware of where they are. Review your medicines with your doctor. Some medicines can make you feel dizzy. This can increase your chance of falling. Ask your doctor what other things that you can do to help prevent falls. This information is not intended to replace advice given to you by your health care provider. Make sure you discuss any questions you have with your health care provider. Document Released: 01/09/2009 Document Revised: 08/21/2015 Document Reviewed: 04/19/2014 Elsevier Interactive Patient Education  2017 [employer].

## 2021-10-22 ENCOUNTER — Telehealth: Payer: Self-pay

## 2021-10-22 ENCOUNTER — Ambulatory Visit: Payer: Self-pay | Admitting: *Deleted

## 2021-10-22 ENCOUNTER — Encounter: Payer: Self-pay | Admitting: *Deleted

## 2021-10-22 NOTE — Telephone Encounter (Signed)
   Telephone encounter was:  Successful.  10/22/2021 Name: Megan [name] MRN: [id number] DOB: Dec 03, 1945  Megan [name] is a 76 y.o. year old female who is a primary care patient of Sharion Balloon, FNP . The community resource team was consulted for assistance with Transportation Needs   Patient advised: She cannot get out of her room through the front door if she is sitting in her wheelchair. She would have to stand up at the door and have someone help her out by holding her or having her use her walker to move out the room to out of the front door as that is how far she feels she can go due to weakness. She is stating she needs assistance down the steps and more than 1 person will need to help her as one person went out before, and they were not able to get her back up quickly.   [facility] management  [place], [place]  Main Phone: [phone]  E-mail: [email or web address]  Website: [email or web address]

## 2021-10-22 NOTE — Chronic Care Management (AMB) (Signed)
  Care Management   Outreach Note  10/22/2021 Name: Megan [name] MRN: [id number] DOB: 07/24/1945  An unsuccessful telephone outreach was attempted today. The patient was referred to the case management team for assistance with care management and care coordination.   Follow Up Plan:  The care management team will reach out to the patient again over the next 7 days.  If patient returns call to provider office, please advise to call [facility] * at [phone]*  Noreene Larsson, [place], [place] [address] Direct Dial: [phone] [email or web address]

## 2021-10-22 NOTE — Patient Instructions (Signed)
Visit Information  Thank you for taking time to visit with me today. Please don't hesitate to contact me if I can be of assistance to you.   Please call the care guide team at [phone] if you need to cancel or reschedule your appointment.   If you are experiencing a Mental Health or Behavioral Health Crisis or need someone to talk to, please call the Suicide and Crisis Lifeline: 988 call the [place] National Suicide Prevention Lifeline: 1-800-273-8255 or TTY: 1-800-799-4 TTY (1-800-799-4889) to talk to a trained counselor call 1-800-273-TALK (toll free, 24 hour hotline) go to [facility] [address], [place] ([phone]) call the [place] Crisis Line: 800-939-9988 call 911  Patient verbalizes understanding of instructions and care plan provided today and agrees to view in MyChart. Active MyChart status and patient understanding of how to access instructions and care plan via MyChart confirmed with patient.     No further follow up required.  Daxtin Leiker, BSW, MSW, LCSW  Licensed Clinical Social Worker  [facility] Care Management [facility]  Mailing Address-[address], [place], [place] [address] Physical Address-[address], [place] , [place] [address] Toll Free Main # [phone] Fax # [phone] Cell # [phone] [email or web address]            

## 2021-10-22 NOTE — Chronic Care Management (AMB) (Signed)
  Chronic Care Management   Note  10/22/2021 Name: KALLAN BISCHOFF MRN: [id number] DOB: [birth year]  Megan [name] is a 76 y.o. year old female who is a primary care patient of Sharion Balloon, FNP. Megan [name] is currently enrolled in care management services. An additional referral for LCSW  was placed.   Follow up plan: Telephone appointment with care management team member scheduled for:10/22/2021  Noreene Larsson, [place], [place] [address] Direct Dial: [phone] [email or web address]

## 2021-10-22 NOTE — Patient Outreach (Signed)
  Care Coordination   Initial Visit Note   10/22/2021 Name: Megan [name] MRN: [id number] DOB: February 03, 1946  Megan [name] is a 76 y.o. year old female who sees Megan [name], Megan Hawthorne, FNP for primary care. I spoke with  Megan [name] by phone today.  What matters to the patients health and wellness today?  No Interventions Indicated.   Patient is currently working with Megan [name], Megan [name], to coordinate transportation to and from physician appointments.  Patient admitted that she is only able to take a few steps, before becoming weak, fatigued, and short-of-breath, needing to sit down.  Patient denied the need for home health physical and occupational therapy services.  Patient indicated that her son can be available to assist her with getting into, and out of, the house and vehicle, if transportation arrangements are made through a community agency of choice.  Patient reported that she and her son, Megan [name] "get along well in the home", and Megan [name] currently performs all the grocery shopping, meal preparation, laundry, medication management/administration, housekeeping duties, and bill payments.  Patient did not wish to consider placement, of any kind, at this time, nor was she interested in receiving in-home care services, after learning that services would be private pay.  Patient denied having food insecurities, is able to pay for prescription medications each month, and did not wish to complete her Advanced Directives (Living Will and [place]) documents.  SDOH assessments and interventions completed:   Yes SDOH Interventions Today    Flowsheet Row Most Recent Value  SDOH Interventions   Food Insecurity Interventions Intervention Not Indicated  Financial Strain Interventions Intervention Not Indicated  Housing Interventions Intervention Not Indicated  Physical Activity Interventions Patient Refused  Stress Interventions Intervention Not  Indicated  Social Connections Interventions Patient Refused  Transportation Interventions Intervention Not Indicated  Depression Interventions/Treatment  Referral to Psychiatry, Medication, Counseling, Currently on Treatment       Care Coordination Interventions Activated:  No  Care Coordination Interventions:  No, not indicated.  Follow up plan: No further intervention required.  Encounter Outcome:  Pt. Visit Completed.  Megan [name], BSW, MSW, LCSW  Licensed Education officer, [facility]  Mailing [place] [address], [place], [place] [address] Physical Address-[address], [place], [place] [address] Toll Free Main # [phone] Fax # [phone] Cell # [phone] [name]

## 2021-10-23 ENCOUNTER — Telehealth: Payer: Self-pay

## 2021-10-23 ENCOUNTER — Encounter: Payer: Self-pay | Admitting: Family Medicine

## 2021-10-23 ENCOUNTER — Ambulatory Visit (INDEPENDENT_AMBULATORY_CARE_PROVIDER_SITE_OTHER): Payer: Medicare Other | Admitting: Family Medicine

## 2021-10-23 DIAGNOSIS — L03116 Cellulitis of left lower limb: Secondary | ICD-10-CM

## 2021-10-23 MED ORDER — SULFAMETHOXAZOLE-TRIMETHOPRIM 800-160 MG PO TABS: 1.0000 | ORAL_TABLET | Freq: Two times a day (BID) | ORAL | 0 refills | Status: AC

## 2021-10-23 NOTE — Progress Notes (Signed)
Virtual Visit via Telephone Note  I connected with Megan [name] on 10/23/21 at 9:09 AM by telephone and verified that I am speaking with the correct person using two identifiers. Megan [name] is currently located at home and nobody is currently with her during this visit. The provider, Loman Brooklyn, FNP is located in their office at time of visit.  I discussed the limitations, risks, security and privacy concerns of performing an evaluation and management service by telephone and the availability of in person appointments. I also discussed with the patient that there may be a patient responsible charge related to this service. The patient expressed understanding and agreed to proceed.  Subjective: PCP: Sharion Balloon, FNP  Chief Complaint  Patient presents with   Cellulitis   Patient reports her left leg became red, hot, and swollen yesterday. She has a history of cellulitis and didn't want to go into the weekend like this.    ROS: Per HPI  Current Outpatient Medications:    albuterol (VENTOLIN HFA) 108 (90 Base) MCG/ACT inhaler, INHALE 2 PUFFS BY MOUTH EVERY 6 HOURS AS NEEDED FOR WHEEZING OR SHORTNESS OF BREATH (Patient taking differently: Inhale 2 puffs into the lungs every 6 (six) hours as needed for shortness of breath.), Disp: 18 g, Rfl: 0   atorvastatin (LIPITOR) 80 MG tablet, Take 1 tablet by mouth once daily (Patient taking differently: Take 80 mg by mouth daily.), Disp: 90 tablet, Rfl: 1   blood glucose meter kit and supplies, Dispense based on patient and insurance preference. Use up to four times daily as directed. (FOR ICD-10 E10.9, E11.9)., Disp: 1 each, Rfl: 0   clotrimazole-betamethasone (LOTRISONE) cream, APPLY  CREAM TOPICALLY TO AFFECTED AREA TWICE DAILY (Patient taking differently: Apply 1 application  topically 2 (two) times daily.), Disp: 45 g, Rfl: 0   diclofenac Sodium (VOLTAREN) 1 % GEL, Apply 4 g topically in the morning, at noon, and at bedtime.,  Disp: , Rfl:    diltiazem (CARDIZEM CD) 240 MG 24 hr capsule, Take 1 capsule (240 mg total) by mouth daily., Disp: 90 capsule, Rfl: 1   esomeprazole (NEXIUM) 40 MG capsule, Take 1 capsule (40 mg total) by mouth daily., Disp: 90 capsule, Rfl: 1   ipratropium-albuterol (DUONEB) 0.5-2.5 (3) MG/3ML SOLN, Take 3 mLs by nebulization in the morning and at bedtime., Disp: 360 mL, Rfl: 0   levETIRAcetam (KEPPRA) 750 MG tablet, Take 750 mg by mouth in the morning and at bedtime., Disp: , Rfl:    levothyroxine (SYNTHROID) 150 MCG tablet, Take 1 tablet (150 mcg total) by mouth daily., Disp: 30 tablet, Rfl: 11   metoprolol tartrate (LOPRESSOR) 100 MG tablet, Take 1 tablet (100 mg total) by mouth 2 (two) times daily., Disp: , Rfl:    mometasone (NASONEX) 50 MCG/ACT nasal spray, Place 2 sprays into the nose daily., Disp: 1 each, Rfl: 12   Vitamin D, Ergocalciferol, (DRISDOL) 1.25 MG (50000 UNIT) CAPS capsule, Take 1 capsule by mouth once a week (Patient taking differently: Take 50,000 Units by mouth every 7 (seven) days.), Disp: 12 capsule, Rfl: 1  No Known Allergies Past Medical History:  Diagnosis Date   Acute diastolic heart failure (HCC)    Anxiety state, unspecified    Asthma    Atrial fibrillation (HCC)    Congestive heart failure, unspecified    Degenerative joint disease    GERD (gastroesophageal reflux disease)    Hyperlipidemia    Hypertension    Lymphedema  Obesity    OSA (obstructive sleep apnea) 06/09/2015   Persistent atrial fibrillation (HCC)     post-termination pauses with afib s/p PPM   Presence of permanent cardiac pacemaker    Sinoatrial node dysfunction (HCC)    s/p PPM   Sleep apnea    compliant with CPAP   Unspecified hypothyroidism    Unspecified venous (peripheral) insufficiency     Observations/Objective: A&O  No respiratory distress or wheezing audible over the phone Mood, judgement, and thought processes all WNL  Assessment and Plan: 1. Cellulitis of left  lower extremity - sulfamethoxazole-trimethoprim (BACTRIM DS) 800-160 MG tablet; Take 1 tablet by mouth 2 (two) times daily for 7 days.  Dispense: 14 tablet; Refill: 0    Follow Up Instructions:  I discussed the assessment and treatment plan with the patient. The patient was provided an opportunity to ask questions and all were answered. The patient agreed with the plan and demonstrated an understanding of the instructions.   The patient was advised to call back or seek an in-person evaluation if the symptoms worsen or if the condition fails to improve as anticipated.  The above assessment and management plan was discussed with the patient. The patient verbalized understanding of and has agreed to the management plan. Patient is aware to call the clinic if symptoms persist or worsen. Patient is aware when to return to the clinic for a follow-up visit. Patient educated on when it is appropriate to go to the emergency department.   Time call ended: 9:20 AM  I provided 11 minutes of non-face-to-face time during this encounter.  Hendricks Limes, MSN, APRN, FNP-C [facility] 10/23/21

## 2021-10-23 NOTE — Telephone Encounter (Signed)
   Telephone encounter was:  Successful.  10/23/2021 Name: Megan [name] MRN: [id number] DOB: 1945-04-23  Megan [name] is a 76 y.o. year old female who is a primary care patient of Megan Balloon, Megan [name] . The community resource team was consulted for assistance with Transportation Needs   Care guide performed the following interventions: Follow up call placed to the patient to discuss status of referral.  -Patient advised: She needs two people to get her in and out of home. She has about five steps outside her home and cannot get up or down. She feels she may need three people really as she stated the doors are narrow as well. Her next appt was 7/31, however, patient cancelled as she was afraid, she would not get transportation. Patient feels she needs the ambulance service.   -Clarification: She cannot get out of her room through the front door if she is sitting in her wheelchair. She would have to stand up at the door and have someone help her out by holding her or having her use her walker to move out the room to out of the front door as that is how far she feels she can go due to weakness. She is stating she needs assistance down the steps and more than 1 person will need to help her as one person went out before, and they were not able to get her back up quickly.    -Conclusion: [facility] [employer] stated that after looking at the steps to the patient's home they will not be able to provide transportation unless the patient is already out of the house and down the steps. This is for the safety of the driver as well as the patients because these steps are very narrow, and it looks to be hard to navigate with a wheelchair. [employer]s advised they are curb to curb only, therefore, they will require the patient to be down the steps as well. [place] fire department does assist with lift assistance, however, is it too early to book the assistance for 11/27/2021.    -Resolution: I have sent a request for  patient to have assessment completed for placement/qualifies for caregiver. LCSW Megan [name]. has spoken to patient as of 10/22/2021. Megan [name] will follow up with patient regarding Transportation support as patient is under [place].   Follow Up Plan:  No further follow up planned at this time. The patient has been provided with needed resources.  [facility] management  [place], [place]  Main Phone: [phone]  E-mail: [email or web address]  Website: [email or web address]

## 2021-10-26 ENCOUNTER — Ambulatory Visit: Payer: Medicare Other | Admitting: Neurology

## 2021-11-12 DIAGNOSIS — N3000 Acute cystitis without hematuria: Secondary | ICD-10-CM | POA: Diagnosis not present

## 2021-11-12 DIAGNOSIS — I959 Hypotension, unspecified: Secondary | ICD-10-CM | POA: Diagnosis not present

## 2021-11-12 DIAGNOSIS — E119 Type 2 diabetes mellitus without complications: Secondary | ICD-10-CM | POA: Diagnosis not present

## 2021-11-12 DIAGNOSIS — R609 Edema, unspecified: Secondary | ICD-10-CM | POA: Diagnosis not present

## 2021-11-12 DIAGNOSIS — E785 Hyperlipidemia, unspecified: Secondary | ICD-10-CM | POA: Diagnosis not present

## 2021-11-12 DIAGNOSIS — R509 Fever, unspecified: Secondary | ICD-10-CM | POA: Diagnosis not present

## 2021-11-12 DIAGNOSIS — Z87891 Personal history of nicotine dependence: Secondary | ICD-10-CM | POA: Diagnosis not present

## 2021-11-12 DIAGNOSIS — E079 Disorder of thyroid, unspecified: Secondary | ICD-10-CM | POA: Diagnosis not present

## 2021-11-12 DIAGNOSIS — I10 Essential (primary) hypertension: Secondary | ICD-10-CM | POA: Diagnosis not present

## 2021-11-16 ENCOUNTER — Ambulatory Visit (INDEPENDENT_AMBULATORY_CARE_PROVIDER_SITE_OTHER): Payer: Medicare Other | Admitting: Nurse Practitioner

## 2021-11-16 ENCOUNTER — Encounter: Payer: Self-pay | Admitting: Nurse Practitioner

## 2021-11-16 DIAGNOSIS — R0602 Shortness of breath: Secondary | ICD-10-CM | POA: Diagnosis not present

## 2021-11-16 DIAGNOSIS — R682 Dry mouth, unspecified: Secondary | ICD-10-CM

## 2021-11-16 NOTE — Progress Notes (Signed)
   Virtual Visit  Note Due to COVID-19 pandemic this visit was conducted virtually. This visit type was conducted due to national recommendations for restrictions regarding the COVID-19 Pandemic (e.g. social distancing, sheltering in place) in an effort to limit this patient's exposure and mitigate transmission in our community. All issues noted in this document were discussed and addressed.  A physical exam was not performed with this format.  I connected with Megan [name] on 11/16/21 at 9:15 by telephone and verified that I am speaking with the correct person using two identifiers. Megan [name] is currently located at home and none is currently with her during visit. The provider, [name] Done, FNP is located in their office at time of visit.  I discussed the limitations, risks, security and privacy concerns of performing an evaluation and management service by telephone and the availability of in person appointments. I also discussed with the patient that there may be a patient responsible charge related to this service. The patient expressed understanding and agreed to proceed.   History and Present Illness:  Mouth is dry and make sit hard for her to take deep breathes. She has been drinking cold water and chewing on chewing gum. She has been using her albuterol neb and that helps.       Review of Systems  Constitutional:  Negative for diaphoresis and weight loss.  Eyes:  Negative for blurred vision, double vision and pain.  Respiratory:  Negative for shortness of breath.   Cardiovascular:  Negative for chest pain, palpitations, orthopnea and leg swelling.  Gastrointestinal:  Negative for abdominal pain.  Skin:  Negative for rash.  Neurological:  Negative for dizziness, sensory change, loss of consciousness, weakness and headaches.  Endo/Heme/Allergies:  Negative for polydipsia. Does not bruise/bleed easily.  Psychiatric/Behavioral:  Negative for memory loss. The patient  does not have insomnia.   All other systems reviewed and are negative.    Observations/Objective: Alert and oriented- answers all questions appropriately No distress   Assessment and Plan: Megan [name] in today with chief complaint of No chief complaint on file.   1. Dry mouth Suck on sour candy Force fluids  2. SOB (shortness of breath) Continue nebulizer as needed Follow up with PCP if worsens   Follow Up Instructions: prn    I discussed the assessment and treatment plan with the patient. The patient was provided an opportunity to ask questions and all were answered. The patient agreed with the plan and demonstrated an understanding of the instructions.   The patient was advised to call back or seek an in-person evaluation if the symptoms worsen or if the condition fails to improve as anticipated.  The above assessment and management plan was discussed with the patient. The patient verbalized understanding of and has agreed to the management plan. Patient is aware to call the clinic if symptoms persist or worsen. Patient is aware when to return to the clinic for a follow-up visit. Patient educated on when it is appropriate to go to the emergency department.   Time call ended:  9:27  I provided 12 minutes of  non face-to-face time during this encounter.    [name] Done, FNP

## 2021-11-21 ENCOUNTER — Emergency Department (HOSPITAL_COMMUNITY): Payer: Medicare Other

## 2021-11-21 ENCOUNTER — Emergency Department (HOSPITAL_COMMUNITY)
Admission: EM | Admit: 2021-11-21 | Discharge: 2021-11-22 | Disposition: A | Discharge status: Home / Self Care | Payer: Medicare Other | Attending: Emergency Medicine | Admitting: Emergency Medicine

## 2021-11-21 ENCOUNTER — Encounter (HOSPITAL_COMMUNITY): Payer: Self-pay | Admitting: Emergency Medicine

## 2021-11-21 DIAGNOSIS — W19XXXA Unspecified fall, initial encounter: Secondary | ICD-10-CM | POA: Diagnosis not present

## 2021-11-21 DIAGNOSIS — S0990XA Unspecified injury of head, initial encounter: Secondary | ICD-10-CM | POA: Diagnosis not present

## 2021-11-21 DIAGNOSIS — R0902 Hypoxemia: Secondary | ICD-10-CM | POA: Diagnosis not present

## 2021-11-21 DIAGNOSIS — Y92009 Unspecified place in unspecified non-institutional (private) residence as the place of occurrence of the external cause: Secondary | ICD-10-CM | POA: Insufficient documentation

## 2021-11-21 DIAGNOSIS — W07XXXA Fall from chair, initial encounter: Secondary | ICD-10-CM | POA: Insufficient documentation

## 2021-11-21 DIAGNOSIS — F0390 Unspecified dementia without behavioral disturbance: Secondary | ICD-10-CM | POA: Diagnosis not present

## 2021-11-21 NOTE — ED Notes (Signed)
Pt moved to hallwayDarrick [name], ED sec called and placed pt on EMS list to be transported back home. Pt given water and sandwich bag

## 2021-11-21 NOTE — ED Provider Notes (Signed)
[facility] EMERGENCY DEPARTMENT Provider Note   CSN: [id number] Arrival date & time: 11/21/21  1947     History {Add pertinent medical, surgical, social history, OB history to HPI:1} Chief Complaint  Patient presents with   Megan [name] is a 76 y.o. female presenting to the ED by EMS after reportedly falling out of her chair at home.  The patient is a very poor historian reports me that she she has "start of dementia".  She says he called the ambulance because she felt weak and slid out of her arm chair at home.  She does not recall whether she struck her head.  She is concerned because she has a prior history of brain bleed.  She denies active headache.  She lives at home with her son, whom she says is sleeping because he wakes up early in the morning.  HPI     Home Medications Prior to Admission medications   Medication Sig Start Date End Date Taking? Authorizing Provider  albuterol (VENTOLIN HFA) 108 (90 Base) MCG/ACT inhaler INHALE 2 PUFFS BY MOUTH EVERY 6 HOURS AS NEEDED FOR WHEEZING OR SHORTNESS OF BREATH Patient taking differently: Inhale 2 puffs into the lungs every 6 (six) hours as needed for shortness of breath. 01/01/21   Sharion Balloon, FNP  atorvastatin (LIPITOR) 80 MG tablet Take 1 tablet by mouth once daily Patient taking differently: Take 80 mg by mouth daily. 03/20/21   Sharion Balloon, FNP  blood glucose meter kit and supplies Dispense based on patient and insurance preference. Use up to four times daily as directed. (FOR ICD-10 E10.9, E11.9). 06/09/20   Evelina Dun A, FNP  clotrimazole-betamethasone (LOTRISONE) cream APPLY  CREAM TOPICALLY TO AFFECTED AREA TWICE DAILY Patient taking differently: Apply 1 application  topically 2 (two) times daily. 03/06/21   Evelina Dun A, FNP  diclofenac Sodium (VOLTAREN) 1 % GEL Apply 4 g topically in the morning, at noon, and at bedtime.    [provider]  diltiazem (CARDIZEM CD) 240 MG 24 hr capsule  Take 1 capsule (240 mg total) by mouth daily. 01/26/21   Sharion Balloon, FNP  esomeprazole (NEXIUM) 40 MG capsule Take 1 capsule (40 mg total) by mouth daily. 12/18/20   Evelina Dun A, FNP  ipratropium-albuterol (DUONEB) 0.5-2.5 (3) MG/3ML SOLN Take 3 mLs by nebulization in the morning and at bedtime. 08/05/21   Sharion Balloon, FNP  levETIRAcetam (KEPPRA) 750 MG tablet Take 750 mg by mouth in the morning and at bedtime. 08/21/21   [provider]  levothyroxine (SYNTHROID) 150 MCG tablet Take 1 tablet (150 mcg total) by mouth daily. 09/21/21 09/21/22  Sharion Balloon, FNP  metoprolol tartrate (LOPRESSOR) 100 MG tablet Take 1 tablet (100 mg total) by mouth 2 (two) times daily. 06/13/21   Johnson, Clanford L, MD  mometasone (NASONEX) 50 MCG/ACT nasal spray Place 2 sprays into the nose daily. 12/18/20   Sharion Balloon, FNP  Vitamin D, Ergocalciferol, (DRISDOL) 1.25 MG (50000 UNIT) CAPS capsule Take 1 capsule by mouth once a week Patient taking differently: Take 50,000 Units by mouth every 7 (seven) days. 01/01/21   Sharion Balloon, FNP      Allergies    Patient has no known allergies.    Review of Systems   Review of Systems  Physical Exam Updated Vital Signs BP (!) 138/56   Pulse 81   Temp (!) 97.3 F (36.3 C)   Resp 16  Ht 5' (1.524 m)   Wt 118.8 kg   SpO2 98%   BMI 51.15 kg/m  Physical Exam Constitutional:      General: She is not in acute distress.    Appearance: She is obese.  HENT:     Head: Normocephalic and atraumatic.  Eyes:     Conjunctiva/sclera: Conjunctivae normal.     Pupils: Pupils are equal, round, and reactive to light.  Cardiovascular:     Rate and Rhythm: Normal rate and regular rhythm.  Pulmonary:     Effort: Pulmonary effort is normal. No respiratory distress.  Abdominal:     General: There is no distension.     Tenderness: There is no abdominal tenderness.  Skin:    General: Skin is warm and dry.  Neurological:     General: No focal  deficit present.     Mental Status: She is alert. Mental status is at baseline.  Psychiatric:        Mood and Affect: Mood normal.        Behavior: Behavior normal.     ED Results / Procedures / Treatments   Labs (all labs ordered are listed, but only abnormal results are displayed) Labs Reviewed - No data to display  EKG None  Radiology No results found.  Procedures Procedures  {Document cardiac monitor, telemetry assessment procedure when appropriate:1}  Medications Ordered in ED Medications - No data to display  ED Course/ Medical Decision Making/ A&P                           Medical Decision Making Amount and/or Complexity of Data Reviewed Radiology: ordered.   Patient is here with possible mechanical injury slid out of chair and may have struck her head on the ground.  She has no evidence of trauma on exam.  Because of her possible dementia and poor memory, CT scan of the head was ordered and personally reviewed and interpreted, notable for ***  Otherwise she is essentially bedbound at her baseline, has normal vital signs, I do not see any other emergent indication for medical work-up at this time.  She does not appear acutely confused.  External records show that in May of this year she was at an outside hospital in [place] system, had a small subdural hematoma at that time.  Subsequent CT head in July showed resolution of his hematoma.  Supplemental history is provided by EMS today.  {Document critical care time when appropriate:1} {Document review of labs and clinical decision tools ie heart score, Chads2Vasc2 etc:1}  {Document your independent review of radiology images, and any outside records:1} {Document your discussion with family members, caretakers, and with consultants:1} {Document social determinants of health affecting pt's care:1} {Document your decision making why or why not admission, treatments were needed:1} Final Clinical Impression(s) / ED  Diagnoses Final diagnoses:  None    Rx / DC Orders ED Discharge Orders     None

## 2021-11-21 NOTE — Discharge Instructions (Addendum)
There is no sign of bleeding on the brain on the brain scan.  Please follow-up with your primary care provider.

## 2021-11-21 NOTE — ED Triage Notes (Signed)
Pt bib EMS after they were called out for a fall. Pt was reported to have slid out of her chair. Pt denies pain. States she "did not hit anything on the way down". Wanted to be evaluated anyway because "have a history of brain bleed". Pt alert and oriented x 4.

## 2021-11-22 NOTE — ED Notes (Signed)
Notified [facility] of patient needing transportation back home.  Advised it will be in the morning before patient can be transportated home due to no trucks being available.

## 2021-11-23 ENCOUNTER — Telehealth: Payer: Self-pay | Admitting: Family

## 2021-11-23 NOTE — Telephone Encounter (Signed)
Called patient 3 times busy each time

## 2021-11-24 ENCOUNTER — Telehealth: Payer: Self-pay

## 2021-11-24 ENCOUNTER — Telehealth: Payer: Self-pay | Admitting: Internal Medicine

## 2021-11-24 NOTE — Telephone Encounter (Signed)
        Patient  visited [facility] on 8/26     Telephone encounter attempt :   1st A HIPAA compliant voice message was left requesting a return call.  Instructed patient to call back     [place] [place] [facility]  [phone] [address], [place], [place] [address] Phone: [phone] Email: [email or web address]

## 2021-11-24 NOTE — Telephone Encounter (Signed)
Patient ask that you giver her a call back. Please advise

## 2021-11-26 NOTE — Telephone Encounter (Signed)
Pt sister called returning a call for pt from [place]. She states it's something about her having a pacemaker put in

## 2021-11-27 ENCOUNTER — Telehealth: Payer: Self-pay | Admitting: Family

## 2021-11-27 ENCOUNTER — Encounter: Payer: Medicare Other | Admitting: Internal Medicine

## 2021-12-03 NOTE — Telephone Encounter (Signed)
PT R/C 

## 2021-12-09 ENCOUNTER — Telehealth: Payer: Self-pay

## 2021-12-09 NOTE — Telephone Encounter (Signed)
   Telephone encounter was:  Successful.  12/09/2021 Name: RASA DEGRAZIA MRN: [id number] DOB: 1945-09-03  CYERRA YIM is a 76 y.o. year old female who is a primary care patient of Sharion Balloon, FNP . The community resource team was consulted for assistance with  SNF  Care guide performed the following interventions: Patient provided with information about care guide support team and interviewed to confirm resource needs.Patient requested help getting into a SNF. I have reached out for the embedded care to for help assisting with this request. I am waiting for follow up from the care team then I will reach back out to the patient  Follow Up Plan:  Care guide will follow up with patient by phone over the next [place], [place] [facility]  [phone] [address], [place], [place] [address] Phone: [phone] Email: [email or web address]

## 2021-12-10 ENCOUNTER — Telehealth: Payer: Self-pay | Admitting: *Deleted

## 2021-12-10 NOTE — Chronic Care Management (AMB) (Signed)
  Care Coordination   Note   12/10/2021 Name: Megan [name] MRN: [id number] DOB: January 13, 1946  Megan [name] is a 76 y.o. year old female who sees Lenna Gilford, Theador Hawthorne, FNP for primary care. I reached out to [name] by phone today to offer care coordination services.  [name] was given information about Care Coordination services today including:   The Care Coordination services include support from the care team which includes your Nurse Coordinator, Clinical Social Worker, or Pharmacist.  The Care Coordination team is here to help remove barriers to the health concerns and goals most important to you. Care Coordination services are voluntary, and the patient may decline or stop services at any time by request to their care team member.   Care Coordination Consent Status: Patient agreed to services and verbal consent obtained.   Follow up plan:  Telephone appointment with care coordination team member scheduled for:  12/11/21  Encounter Outcome:  Pt. Scheduled  [place]  Direct Dial: [phone]

## 2021-12-11 ENCOUNTER — Ambulatory Visit: Payer: Self-pay | Admitting: *Deleted

## 2021-12-14 ENCOUNTER — Encounter: Payer: Self-pay | Admitting: *Deleted

## 2021-12-14 ENCOUNTER — Encounter: Payer: Self-pay | Admitting: Family Medicine

## 2021-12-14 ENCOUNTER — Ambulatory Visit (INDEPENDENT_AMBULATORY_CARE_PROVIDER_SITE_OTHER): Payer: Medicare Other | Admitting: Family Medicine

## 2021-12-14 DIAGNOSIS — B370 Candidal stomatitis: Secondary | ICD-10-CM

## 2021-12-14 MED ORDER — NYSTATIN 100000 UNIT/ML MT SUSP: 5.0000 mL | Freq: Four times a day (QID) | OROMUCOSAL | 0 refills | Status: DC

## 2021-12-14 NOTE — Progress Notes (Signed)
Virtual Visit  Note Due to COVID-19 pandemic this visit was conducted virtually. This visit type was conducted due to national recommendations for restrictions regarding the COVID-19 Pandemic (e.g. social distancing, sheltering in place) in an effort to limit this patient's exposure and mitigate transmission in our community. All issues noted in this document were discussed and addressed.  A physical exam was not performed with this format.  I connected with Megan [name] on 12/14/21 at 1037 by telephone and verified that I am speaking with the correct person using two identifiers. Megan [name] is currently located at home and her family is currently with her during the visit. The provider, Gwenlyn Perking, FNP is located in their office at time of visit.  I discussed the limitations, risks, security and privacy concerns of performing an evaluation and management service by telephone and the availability of in person appointments. I also discussed with the patient that there may be a patient responsible charge related to this service. The patient expressed understanding and agreed to proceed.   History and Present Illness:  Sore Throat  This is a new problem. The current episode started yesterday. The problem has been waxing and waning. There has been no fever. The pain is moderate. Associated symptoms include headaches. Pertinent negatives include no abdominal pain, congestion, coughing, diarrhea, ear discharge, ear pain, hoarse voice, neck pain, shortness of breath, stridor, swollen glands, trouble swallowing or vomiting. She has had no exposure to strep. She has tried nothing for the symptoms.  She also reports a white coating on her tongue. Her tongue is also painful. She does use steroid inhalers and has had thrush in the remote past.     Review of Systems  Constitutional:  Negative for chills and fever.  HENT:  Positive for sore throat. Negative for congestion, ear discharge, ear  pain, hoarse voice, sinus pain and trouble swallowing.   Respiratory:  Negative for cough, hemoptysis, sputum production, shortness of breath, wheezing and stridor.   Cardiovascular:  Negative for chest pain.  Gastrointestinal:  Negative for abdominal pain, diarrhea, nausea and vomiting.  Musculoskeletal:  Negative for neck pain.  Neurological:  Positive for headaches.     Observations/Objective: Alert and oriented x 3. Able to speak in full sentences without difficulty.   Assessment and Plan: Megan [name] was seen today for sore throat.  Diagnoses and all orders for this visit:  Thrush Reporting sore throat and sore tongue with white coating. Nystatin as below.  -     nystatin (MYCOSTATIN) 100000 UNIT/ML suspension; Take 5 mLs (500,000 Units total) by mouth 4 (four) times daily. Swish and swallow     Follow Up Instructions: Return to office for new or worsening symptoms, or if symptoms persist.     I discussed the assessment and treatment plan with the patient. The patient was provided an opportunity to ask questions and all were answered. The patient agreed with the plan and demonstrated an understanding of the instructions.   The patient was advised to call back or seek an in-person evaluation if the symptoms worsen or if the condition fails to improve as anticipated.  The above assessment and management plan was discussed with the patient. The patient verbalized understanding of and has agreed to the management plan. Patient is aware to call the clinic if symptoms persist or worsen. Patient is aware when to return to the clinic for a follow-up visit. Patient educated on when it is appropriate to go to the emergency department.  Time call ended:  1050  I provided 13 minutes of  non face-to-face time during this encounter.    Gwenlyn Perking, FNP

## 2021-12-14 NOTE — Patient Instructions (Signed)
Visit Information  Thank you for taking time to visit with me today. Please don't hesitate to contact me if I can be of assistance to you.   Following are the goals we discussed today:   Goals Addressed               This Visit's Progress     Pursue Higher Level of Care Options. (pt-stated)   On track     Care Coordination Interventions:  Discussed plans with patient for ongoing care management follow up. Provided patient with direct contact information for care management team. Screening for signs and symptoms of depression related to chronic disease state.  Assessed social determinant of health barriers. Active listening/reflection utilized.  Problem solving/task-centered strategies developed. Quality of sleep assessed and sleep hygiene techniques promoted.  Verbalization of feelings encouraged.  Discussed caregiver resources and support. Mailed the following list of agencies and resources, on 12/11/2021:        [place] and Facilities in [place], [place] [facility] of [place] in [place], [place] Adult [facility] in [place], [place] in [place], [place]   Encouraged to review the list of agencies and resources provided, and be prepared to discuss during our next scheduled telephone outreach call.         Our next appointment is by telephone on 12/23/2021 at 11:00 am.  Please call the care guide team at [phone] if you need to cancel or reschedule your appointment.   If you are experiencing a Mental Health or [place] or need someone to talk to, please call the Suicide and Crisis Lifeline: 988 call the [place] National Suicide Prevention Lifeline: 469-539-6506 or TTY: 626 488 4332 TTY 312-363-9640) to talk to a trained counselor call 1-800-273-TALK (toll free, 24 hour hotline) go to [facility] [address] [place] [phone]) call the [place]: [phone] call 911  Patient verbalizes understanding of instructions and care plan provided today and agrees to view in [place]. Active MyChart status and patient understanding of how to access instructions and care plan via MyChart confirmed with patient.     Telephone follow up appointment with care management team member scheduled for:  12/23/2021 at 11:00 am.  Nat Christen, BSW, MSW, [place]  Licensed Clinical Social Worker  [facility]  [facility]ailing [place]. [address], [place], [place] [address] Physical Address[address], [place], [place] [address] Toll Free Main # [phone] Fax # [phone] Cell # [phone] [email or web address]

## 2021-12-14 NOTE — Patient Outreach (Signed)
  Care Coordination   Initial Visit Note   12/14/2021  Name: Megan [name] MRN: [id number] DOB: 18-Aug-1945  Megan [name] is a 76 y.o. year old female who sees Lenna Gilford, Theador Hawthorne, FNP for primary care. I spoke with [name] by phone today.  What matters to the patients health and wellness today?  Pursue Higher Level of Care Options.    Goals Addressed               This Visit's Progress     Pursue Higher Level of Care Options. (pt-stated)   On track     Care Coordination Interventions:  Discussed plans with patient for ongoing care management follow up. Provided patient with direct contact information for care management team. Screening for signs and symptoms of depression related to chronic disease state.  Assessed social determinant of health barriers. Active listening/reflection utilized.  Problem solving/task-centered strategies developed. Quality of sleep assessed and sleep hygiene techniques promoted.  Verbalization of feelings encouraged.  Discussed caregiver resources and support. Mailed the following list of agencies and resources, on 12/11/2021:        [place] and Facilities in [place], [place] List of [place] in [place], [place] Adult [facility] in [place], [place] in [place], [place]   Encouraged to review the list of agencies and resources provided, and be prepared to discuss during our next scheduled telephone outreach call.         SDOH assessments and interventions completed:  Yes.  SDOH Interventions Today    Flowsheet Row Most Recent Value  SDOH Interventions   Food Insecurity Interventions Intervention Not Indicated  Housing Interventions Intervention Not Indicated  Transportation Interventions Intervention Not Indicated  Utilities Interventions Intervention Not  Indicated  Alcohol Usage Interventions Intervention Not Indicated (Score <7)  Depression Interventions/Treatment  Patient refuses Treatment  Financial Strain Interventions Intervention Not Indicated  Physical Activity Interventions Patient Refused  Stress Interventions Intervention Not Indicated  Social Connections Interventions Patient Refused        Care Coordination Interventions Activated:  Yes.   Care Coordination Interventions:  Yes, provided.   Follow up plan: Follow up call scheduled for 12/23/2021 at 11:00 am.  Encounter Outcome:  Pt. Visit Completed.   Nat Christen, BSW, MSW, LCSW  Licensed Education officer, environmental [facility] System  Mailing [place] [address], [place], [place] [address] Physical Address-[address] [place], [place] [address] Toll Free Main # [phone] Fax # [phone] Cell # [phone] [email or web address]

## 2021-12-15 NOTE — Telephone Encounter (Signed)
Called and spoke with patient she will call and see where she can get it in when she calls back please schedule face to face for FL2

## 2021-12-23 ENCOUNTER — Ambulatory Visit: Payer: Self-pay | Admitting: *Deleted

## 2021-12-23 ENCOUNTER — Encounter: Payer: Self-pay | Admitting: *Deleted

## 2021-12-23 NOTE — Patient Outreach (Signed)
  Care Coordination   Follow Up Visit Note   12/23/2021  Name: Megan [name] MRN: [id number] DOB: Jun 27, 1945  Megan [name] is a 76 y.o. year old female who sees Lenna Gilford, Theador Hawthorne, FNP for primary care. I spoke with [name] by phone today.  What matters to the patients health and wellness today?   Pursue Higher Level of Care Options.   Goals Addressed               This Visit's Progress     Pursue Higher Level of Care Options. (pt-stated)   On track     Care Coordination Interventions:  Active listening/reflection utilized.  Problem solving/task-centered strategies developed. Verbalization of feelings encouraged.  Discussed caregiver resources and support. Reviewed the following list of caregiver agencies and resources:        [facility] and Facilities in [place], [place] in [place], [place] Adult [facility] in [place], [place] in [place], [place]   Encouraged patient to begin contacting agencies of interest, in an effort to establish services.         SDOH assessments and interventions completed:  Yes.  Care Coordination Interventions Activated:  Yes.   Care Coordination Interventions:  Yes, provided.   Follow up plan: Follow up call scheduled for 01/04/2022 at 10:30 am.  Encounter Outcome:  Pt. Visit Completed.   Nat Christen, BSW, MSW, LCSW  Licensed Education officer, environmental [facility] System  Mailing [place] [address], [place], [place] [address] Physical Address-[address] [place], [place] [address] Toll Free Main # [phone] Fax # [phone] Cell # [phone] [email or web address]

## 2021-12-23 NOTE — Patient Instructions (Signed)
Visit Information  Thank you for taking time to visit with me today. Please don't hesitate to contact me if I can be of assistance to you.   Following are the goals we discussed today:   Goals Addressed               This Visit's Progress     Pursue Higher Level of Care Options. (pt-stated)   On track     Care Coordination Interventions:  Active listening/reflection utilized.  Problem solving/task-centered strategies developed. Verbalization of feelings encouraged.  Discussed caregiver resources and support. Reviewed the following list of caregiver agencies and resources:        [facility] in [place], [facility] in [place], [place] [facility] in [place], [facility] in [place], [place]   Encouraged patient to begin contacting agencies of interest, in an effort to establish services.         Our next appointment is by telephone on 01/04/2022 at 10:30 am.  Please call the care guide team at [phone] if you need to cancel or reschedule your appointment.   If you are experiencing a Mental Health or [place] or need someone to talk to, please call the Suicide and Crisis Lifeline: 988 call the [place] National Suicide Prevention Lifeline: (616) 785-9509 or TTY: 702 341 6992 TTY 603-426-3239) to talk to a trained counselor call 1-800-273-TALK (toll free, 24 hour hotline) go to [facility] [address] [place] ([phone]) call the [place]: [phone] call 911  Patient verbalizes understanding of instructions and care plan provided today and agrees to view in [place]. Active MyChart status and patient understanding of how to access instructions and care plan via MyChart confirmed with patient.     Telephone follow  up appointment with care management team member scheduled for: 01/04/2022 at 10:30 am.  Nat Christen, BSW, MSW, [place]  Licensed Clinical Social Worker  [place]  Mailing [place]. [address] [place], [place] [address] Physical Address-[address], [place], [place] [address] Toll Free Main # [phone] Fax # [phone] Cell # [phone] [email or web address]

## 2021-12-28 ENCOUNTER — Ambulatory Visit (INDEPENDENT_AMBULATORY_CARE_PROVIDER_SITE_OTHER): Payer: Medicare Other | Admitting: Family

## 2021-12-28 ENCOUNTER — Encounter: Payer: Self-pay | Admitting: Family

## 2021-12-28 DIAGNOSIS — L03116 Cellulitis of left lower limb: Secondary | ICD-10-CM | POA: Diagnosis not present

## 2021-12-28 MED ORDER — FUROSEMIDE 20 MG PO TABS: 20.0000 mg | ORAL_TABLET | Freq: Every day | ORAL | 0 refills | Status: DC

## 2021-12-28 MED ORDER — CEPHALEXIN 500 MG PO CAPS: 500.0000 mg | ORAL_CAPSULE | Freq: Three times a day (TID) | ORAL | 0 refills | Status: DC

## 2021-12-28 NOTE — Progress Notes (Signed)
Virtual Visit  Note Due to COVID-19 pandemic this visit was conducted virtually. This visit type was conducted due to national recommendations for restrictions regarding the COVID-19 Pandemic (e.g. social distancing, sheltering in place) in an effort to limit this patient's exposure and mitigate transmission in our community. All issues noted in this document were discussed and addressed.  A physical exam was not performed with this format.  I connected with Megan [name] on 12/28/21 at 2:20 pm by telephone and verified that I am speaking with the correct person using two identifiers. Megan [name] is currently located at home and no one is currently with her during visit. The provider, Evelina Dun, FNP is located in their office at time of visit.  I discussed the limitations, risks, security and privacy concerns of performing an evaluation and management service by telephone and the availability of in person appointments. I also discussed with the patient that there may be a patient responsible charge related to this service. The patient expressed understanding and agreed to proceed.  [name], ligman are scheduled for a virtual visit with your provider today.    Just as we do with appointments in the office, we must obtain your consent to participate.  Your consent will be active for this visit and any virtual visit you may have with one of our providers in the next 365 days.    If you have a MyChart account, I can also send a copy of this consent to you electronically.  All virtual visits are billed to your insurance company just like a traditional visit in the office.  As this is a virtual visit, video technology does not allow for your provider to perform a traditional examination.  This may limit your provider's ability to fully assess your condition.  If your provider identifies any concerns that need to be evaluated in person or the need to arrange testing such as labs, EKG, etc, we will  make arrangements to do so.    Although advances in technology are sophisticated, we cannot ensure that it will always work on either your end or our end.  If the connection with a video visit is poor, we may have to switch to a telephone visit.  With either a video or telephone visit, we are not always able to ensure that we have a secure connection.   I need to obtain your verbal consent now.   Are you willing to proceed with your visit today?   Megan [name] has provided verbal consent on 12/28/2021 for a virtual visit (video or telephone).   Evelina Dun, [employer] 12/28/2021  2:43 PM    History and Present Illness:  HPI PT calls the office with left lower leg increased erythemas, swelling, tenderness, and warm. States she is not walking. States she stands to use the bedside commode and that is it.   Has hx cellulitis and states it feels the same.   Review of Systems  Cardiovascular:  Positive for leg swelling.  All other systems reviewed and are negative.    Observations/Objective: No SOB or distress noted   Assessment and Plan: 1. Cellulitis of left lower extremity Compression  Start Keflex TID for 7 days Lasix daily for 5 days Elevate leg Needs follow up in 1 week, states this will be hard because she does not have transportation and can not leave her house.  - cephALEXin (KEFLEX) 500 MG capsule; Take 1 capsule (500 mg total) by mouth 3 (three) times  daily.  Dispense: 21 capsule; Refill: 0 - furosemide (LASIX) 20 MG tablet; Take 1 tablet (20 mg total) by mouth daily.  Dispense: 30 tablet; Refill: 0     I discussed the assessment and treatment plan with the patient. The patient was provided an opportunity to ask questions and all were answered. The patient agreed with the plan and demonstrated an understanding of the instructions.   The patient was advised to call back or seek an in-person evaluation if the symptoms worsen or if the condition fails to improve as  anticipated.  The above assessment and management plan was discussed with the patient. The patient verbalized understanding of and has agreed to the management plan. Patient is aware to call the clinic if symptoms persist or worsen. Patient is aware when to return to the clinic for a follow-up visit. Patient educated on when it is appropriate to go to the emergency department.   Time call ended:  2:34 pm   I provided 14 minutes of  non face-to-face time during this encounter.    Evelina Dun, FNP

## 2022-01-04 ENCOUNTER — Ambulatory Visit: Payer: Self-pay | Admitting: *Deleted

## 2022-01-04 ENCOUNTER — Telehealth: Payer: Self-pay | Admitting: Cardiovascular Disease

## 2022-01-04 NOTE — Telephone Encounter (Signed)
Pt would like a callback regarding upcoming appt. Please advise 

## 2022-01-04 NOTE — Telephone Encounter (Signed)
Left message for patient to call office.  

## 2022-01-04 NOTE — Patient Outreach (Signed)
  Care Coordination   01/04/2022  Name: Megan [name] MRN: [id number] DOB: [birth year]   Care Coordination Outreach Attempts:  An unsuccessful telephone outreach was attempted today to offer the patient information about available care coordination services as a benefit of their health plan. HIPAA compliant messages left on voicemail, providing contact information for CSW, encouraging patient to return CSW's call at her earliest convenience.  Follow Up Plan:  Additional outreach attempts will be made to offer the patient care coordination information and services.   Encounter Outcome:  No Answer.   Care Coordination Interventions Activated:  No.    Care Coordination Interventions:  No, not indicated.    Nat Christen, BSW, MSW, LCSW  Licensed Education officer, environmental Health System  Mailing [place] [address]. [address], [place], [place] [address] Physical Address-[address], [place], [place] [address] Toll Free Main # [phone] Fax # [phone] Cell # [phone] [email or web address]

## 2022-01-05 ENCOUNTER — Encounter: Payer: Medicare Other | Admitting: Cardiovascular Disease

## 2022-01-07 ENCOUNTER — Encounter: Payer: Self-pay | Admitting: *Deleted

## 2022-01-07 ENCOUNTER — Ambulatory Visit: Payer: Self-pay | Admitting: *Deleted

## 2022-01-07 DIAGNOSIS — I5032 Chronic diastolic (congestive) heart failure: Secondary | ICD-10-CM

## 2022-01-07 DIAGNOSIS — I495 Sick sinus syndrome: Secondary | ICD-10-CM

## 2022-01-07 DIAGNOSIS — S065XAA Traumatic subdural hemorrhage with loss of consciousness status unknown, initial encounter: Secondary | ICD-10-CM

## 2022-01-07 NOTE — Patient Instructions (Signed)
Visit Information  Thank you for taking time to visit with me today. Please don't hesitate to contact me if I can be of assistance to you.   Following are the goals we discussed today:   Goals Addressed               This Visit's Progress     Pursue Higher Level of Care Options. (pt-stated)   On track     Care Coordination Interventions:  Active listening/reflection utilized.  Problem solving/task-centered strategies employed. Verbalization of feelings encouraged.  Emotional support provided. Reviewed caregiver resources and supportive services.   Placed referral to [facility] Guide to provide patient with transportation resources and arrange services. Encouraged patient to consider applying for [place] Medicaid, through the [place]. Mailed patient a Special Assistance Long-Term Care Medicaid application on 30/11/2328. ~ Patient exceeds income guidelines to qualify for Adult Medicaid, through the [place]. Be prepared to complete Special Assistance Long-Term Care Medicaid application with CSW, during our next scheduled telephone outreach call.         Our next appointment is by telephone on 01/20/2022 at 11:30 am.  Please call the care guide team at [phone] if you need to cancel or reschedule your appointment.   If you are experiencing a Mental Health or [place] or need someone to talk to, please call the Suicide and Crisis Lifeline: 988 call the [place] National Suicide Prevention Lifeline: 939-321-3645 or TTY: 620-726-2935 TTY 262-291-2883) to talk to a trained counselor call 1-800-273-TALK (toll free, 24 hour hotline) go to [facility] [address] [place] [phone]) call the [place]: (782) [phone] call 911  Patient verbalizes understanding of instructions and care plan  provided today and agrees to view in [place]. Active MyChart status and patient understanding of how to access instructions and care plan via MyChart confirmed with patient.     Telephone follow up appointment with care management team member scheduled for:  01/20/2022 at 11:30 am.  Nat Christen, BSW, MSW, [place]  Licensed Clinical Social Worker  [place]  Mailing [place]. [address], [place], [place] [address] Physical Address-[address], [place], [place] [address] Toll Free Main # [phone] Fax # [phone] Cell # [phone] [email or web address]

## 2022-01-07 NOTE — Telephone Encounter (Signed)
Patient states that she has appointment scheduled on 10/24 with Dr. Myles Gip but is unsure of how she will be able to get to the office. States that she is unable to walk and that she can only stand enough to get to the bedside commode and back to the chair. States that her son lives at the home with her but he is unable to move her around. Patient states that she does have a wheelchair but is unable to get off of her porch because of the steps. I asked the patient about possibly having a ramp built so that she has access to get in and out of her home but states that her son felt that a ramp would be of no use because "she is too big to push up and down a ramp." I asked the patient if her son could possibly get a friend to help get her to the car so that she can make her appointment. States that her son has started a new job and she is unsure if he will be able to take her but that she has the appointment scheduled at 4 just so he could possibly take her but she will need to talk to him about it. Informed the patient that she needs to discuss this and the importance of making her appointment with her son. States that she is going to speak with him but that she would also like to know if there were resources available to help her with transferring and transportation needs?

## 2022-01-07 NOTE — Patient Outreach (Signed)
  Care Coordination   Follow Up Visit Note   01/07/2022  Name: Megan [name] MRN: [id number] DOB: Nov 15, 1945  Megan [name] is a 76 y.o. year old female who sees Lenna Gilford, Theador Hawthorne, FNP for primary care. I spoke with [name] by phone today.  What matters to the patients health and wellness today?  Pursue Higher Level of Care Options.    Goals Addressed               This Visit's Progress     Pursue Higher Level of Care Options. (pt-stated)   On track     Care Coordination Interventions:  Active listening/reflection utilized.  Problem solving/task-centered strategies employed. Verbalization of feelings encouraged.  Emotional support provided. Reviewed caregiver resources and supportive services.   Placed referral to [facility] Guide to provide patient with transportation resources and arrange services. Encouraged patient to consider applying for [place] Medicaid, through the [place]. Mailed patient a Special Assistance Long-Term Care Medicaid application on 52/10/221. ~ Patient exceeds income guidelines to qualify for Adult Medicaid, through the [place]. Be prepared to complete Special Assistance Long-Term Care Medicaid application with CSW, during our next scheduled telephone outreach call.       SDOH assessments and interventions completed:  Yes.   Care Coordination Interventions Activated:  Yes.    Care Coordination Interventions:  Yes, provided.    Follow up plan: Follow up call scheduled for 01/20/2022 at 11:30 am.   Encounter Outcome:  Pt. Visit Completed.    Nat Christen, BSW, MSW, LCSW  Licensed Education officer, environmental Health System  Mailing [place] [address] [place], [place] [address] Physical Address[address], [place], [place] [address] Toll Free Main # [phone] Fax #  [phone] Cell # [phone] [email or web address]

## 2022-01-08 ENCOUNTER — Telehealth: Payer: Self-pay

## 2022-01-08 NOTE — Telephone Encounter (Addendum)
   Telephone encounter was:  Successful.  01/08/2022 Name: Megan [name] MRN: [id number] DOB: [birth year]  [name] is a 76 y.o. year old female who is a primary care patient of Sharion Balloon, FNP . The community resource team was consulted for assistance with Transportation Needs   Care guide performed the following interventions: Patient provided with information about care guide support team and interviewed to confirm resource needs. CG advised patient updated information from Management regarding transportation - "We are unable to transport patient - services evaluated her steps (patient's home) and deemed them unsafe for their staff and patients safety." Unfortunately, at this time, we do not have a vendor that is able to assist.   If there is any further questions or concerns, please contact Melissa Nettles.  Follow Up Plan:  No further follow up planned at this time. The patient has been provided with needed resources.  [place] management  [place], [place]  Main Phone: [phone]  E-mail: [email or web address]  Website: [email or web address]

## 2022-01-19 ENCOUNTER — Other Ambulatory Visit: Payer: Self-pay | Admitting: Family

## 2022-01-19 ENCOUNTER — Encounter: Payer: Medicare Other | Admitting: Cardiovascular Disease

## 2022-01-19 DIAGNOSIS — I4891 Unspecified atrial fibrillation: Secondary | ICD-10-CM

## 2022-01-19 MED ORDER — DILTIAZEM HCL ER COATED BEADS 240 MG PO CP24: 240.0000 mg | ORAL_CAPSULE | Freq: Every day | ORAL | 2 refills | Status: DC

## 2022-01-19 NOTE — Telephone Encounter (Signed)
Pt aware refill sent to pharmacy 

## 2022-01-19 NOTE — Telephone Encounter (Signed)
  Prescription Request  01/19/2022  Is this a "Controlled Substance" medicine? diltiazem (CARDIZEM CD) 240 MG 24 hr capsule  Have you seen your PCP in the last 2 weeks? 12/28/2021  If YES, route message to pool  -  If NO, patient needs to be scheduled for appointment.  What is the name of the medication or equipment? diltiazem (CARDIZEM CD) 240 MG 24 hr capsule  Have you contacted your pharmacy to request a refill? yes   Which pharmacy would you like this sent to? [facility]   Patient notified that their request is being sent to the clinical staff for review and that they should receive a response within 2 business days.

## 2022-01-20 ENCOUNTER — Encounter: Payer: Self-pay | Admitting: *Deleted

## 2022-01-20 ENCOUNTER — Ambulatory Visit: Payer: Self-pay | Admitting: *Deleted

## 2022-01-20 NOTE — Patient Outreach (Signed)
  Care Coordination   Follow Up Visit Note   01/20/2022  Name: Megan [name] MRN: [id number] DOB: [birth year]  Megan [name] is a 76 y.o. year old female who sees Lenna Gilford, Theador Hawthorne, FNP for primary care. I spoke with [name] by phone today.  What matters to the patients health and wellness today?  Pursue Higher Level of Care Options.   Goals Addressed               This Visit's Progress     Pursue Higher Level of Care Options. (pt-stated)   On track     Care Coordination Interventions:  Active listening utilized.  Task-centered strategies employed. Continue to encourage patient to apply for [place] Medicaid, through the [place], as payor source for long-term care facility placement. Confirmed receipt of Special Assistance Long-Term Care Medicaid application, mailed on 11/09/4816. Reviewed Special Assistance Long-Term Care Medicaid application with patient, and attempted to assist with completion, as son, Dotsie Gillette was unavailable.  CSW collaboration with patient's son, Jessye Imhoff to request assistance with completion of Special Assistance Long-Term Care Medicaid application with patient. Encouraged patient's son, Gary Bultman to contact CSW directly [phone]) if he requires assistance with application ([place] Medicaid) completion and/or submission.      SDOH assessments and interventions completed:  Yes.   Care Coordination Interventions Activated:  Yes.    Care Coordination Interventions:  Yes, Provided.    Follow up plan: Outreach Call Scheduled on 02/03/2022 at 9:15 am.    Encounter Outcome:  Pt. Visit Completed.    Nat Christen, BSW, MSW, LCSW  Licensed Education officer, environmental Health System  Mailing [place] [address]. [address] [place], [place] [address] Physical Address[address] [place],  [place] [address] Toll Free Main # [phone] Fax # [phone] Cell # [phone] [email or web address]

## 2022-01-20 NOTE — Patient Instructions (Signed)
Visit Information  Thank you for taking time to visit with me today. Please don't hesitate to contact me if I can be of assistance to you.   Following are the goals we discussed today:   Goals Addressed               This Visit's Progress     Pursue Higher Level of Care Options. (pt-stated)   On track     Care Coordination Interventions:  Active listening utilized.  Task-centered strategies employed. Continue to encourage patient to apply for [place] Medicaid, through the [place], as payor source for long-term care facility placement. Confirmed receipt of Special Assistance Long-Term Care Medicaid application, mailed on 37/62/8315. Reviewed Special Assistance Long-Term Care Medicaid application with patient, and attempted to assist with completion, as son, Heela Heishman was unavailable.  CSW collaboration with patient's son, Shania Bjelland to request assistance with completion of Special Assistance Long-Term Care Medicaid application with patient. Encouraged patient's son, Abaigeal Moomaw to contact CSW directly [phone]) if he requires assistance with application ([place] Medicaid) completion and/or submission.        Our next appointment is by telephone on 02/03/2022 at 9:15 am.  Please call the care guide team at [phone] if you need to cancel or reschedule your appointment.   If you are experiencing a Mental Health or [place] or need someone to talk to, please call the Suicide and Crisis Lifeline: 988 call the [place] National Suicide Prevention Lifeline: 437 177 4023 or TTY: 3067109062 TTY 8787113954) to talk to a trained counselor call 1-800-273-TALK (toll free, 24 hour hotline) go to [facility] [address] [place] [phone]) call the [place]: [phone] call 911  Patient  verbalizes understanding of instructions and care plan provided today and agrees to view in [place]. Active MyChart status and patient understanding of how to access instructions and care plan via MyChart confirmed with patient.     Telephone follow up appointment with care management team member scheduled for:  02/03/2022 at 9:15 am.  Nat Christen, BSW, MSW, [place]  Licensed Clinical Social Worker  [place]  Mailing [place]. [address] [place], [place] [address] Physical Address-[address], [place], [place] [address] Toll Free Main # [phone] Fax # [phone] Cell # [phone] [email or web address]

## 2022-01-22 DIAGNOSIS — R69 Illness, unspecified: Secondary | ICD-10-CM | POA: Diagnosis not present

## 2022-01-22 DIAGNOSIS — R5381 Other malaise: Secondary | ICD-10-CM | POA: Diagnosis not present

## 2022-02-01 ENCOUNTER — Ambulatory Visit: Payer: Medicare Other | Attending: Cardiovascular Disease | Admitting: Cardiovascular Disease

## 2022-02-01 ENCOUNTER — Encounter: Payer: Self-pay | Admitting: Cardiovascular Disease

## 2022-02-01 VITALS — BP 122/58 | HR 76 | Ht 60.0 in

## 2022-02-01 DIAGNOSIS — I4821 Permanent atrial fibrillation: Secondary | ICD-10-CM | POA: Diagnosis not present

## 2022-02-01 LAB — CUP PACEART INCLINIC DEVICE CHECK
Battery Remaining Longevity: 2 mo
Battery Voltage: 2.65 V
Brady Statistic RA Percent Paced: 0 %
Brady Statistic RV Percent Paced: 1.7 %
Date Time Interrogation Session: 20231106171240
Implantable Lead Connection Status: 753985
Implantable Lead Connection Status: 753985
Implantable Lead Implant Date: 20100728
Implantable Lead Implant Date: 20100728
Implantable Lead Location: 753859
Implantable Lead Location: 753860
Implantable Pulse Generator Implant Date: 20100728
Lead Channel Impedance Value: 425 Ohm
Lead Channel Pacing Threshold Amplitude: 1 V
Lead Channel Pacing Threshold Amplitude: 1 V
Lead Channel Pacing Threshold Pulse Width: 0.4 ms
Lead Channel Pacing Threshold Pulse Width: 0.4 ms
Lead Channel Sensing Intrinsic Amplitude: 0.3 mV
Lead Channel Sensing Intrinsic Amplitude: 12 mV
Lead Channel Setting Pacing Amplitude: 2 V
Lead Channel Setting Pacing Pulse Width: 0.4 ms
Lead Channel Setting Sensing Sensitivity: 2 mV
Pulse Gen Model: 2210
Pulse Gen Serial Number: 2339600

## 2022-02-01 NOTE — Patient Instructions (Signed)
Medication Instructions:  Your physician recommends that you continue on your current medications as directed. Please refer to the Current Medication list given to you today.  *If you need a refill on your cardiac medications before your next appointment, please call your pharmacy*   Follow-Up: At [facility], you and your health needs are our priority.  As part of our continuing mission to provide you with exceptional heart care, we have created designated Provider Care Teams.  These Care Teams include your primary Cardiologist (physician) and Advanced Practice Providers (APPs -  Physician Assistants and Nurse Practitioners) who all work together to provide you with the care you need, when you need it.  Your next appointment:   1 year(s)  The format for your next appointment:   In Person  Provider:   You may see Augustus E Mealor, MD or one of the following Advanced Practice Providers on your designated Care Team:   Renee Ursuy, PA-C Michael "Andy" Tillery, PA-C    Important Information About [place]       

## 2022-02-01 NOTE — Progress Notes (Signed)
  PCP: Hawks, Christy A, FNP Primary Cardiologist: Dr McDowell Primary EP:  Dr   Megan [name] is a 76 y.o. female who presents today for routine electrophysiology followup.  She has not done well recently.  She has been in rehab.  She had a mechanical fall and developed SDH for which she was hospitalized.  Her coumadin was discontinued.  She remains weak.  Today, she denies symptoms of palpitations, chest pain, shortness of breath,  lower extremity edema, dizziness, presyncope, or syncope.  The patient is otherwise without complaint today.   Past Medical History:  Diagnosis Date   Acute diastolic heart failure (HCC)    Anxiety state, unspecified    Asthma    Atrial fibrillation (HCC)    Congestive heart failure, unspecified    Degenerative joint disease    GERD (gastroesophageal reflux disease)    Hyperlipidemia    Hypertension    Lymphedema    Obesity    OSA (obstructive sleep apnea) 06/09/2015   Persistent atrial fibrillation (HCC)     post-termination pauses with afib s/p PPM   Presence of permanent cardiac pacemaker    Sinoatrial node dysfunction (HCC)    s/p PPM   Sleep apnea    compliant with CPAP   Unspecified hypothyroidism    Unspecified venous (peripheral) insufficiency    Past Surgical History:  Procedure Laterality Date   CATARACT EXTRACTION W/PHACO Left 09/09/2014   Procedure: CATARACT EXTRACTION PHACO AND INTRAOCULAR LENS PLACEMENT (IOC);  Surgeon: Kerry Hunt, MD;  Location: [facility];  Service: Ophthalmology;  Laterality: Left;  CDE: 6.99   CATARACT EXTRACTION W/PHACO Right 10/07/2014   Procedure: CATARACT EXTRACTION PHACO AND INTRAOCULAR LENS PLACEMENT RIGHT EYE;  Surgeon: Kerry Hunt, MD;  Location: [facility];  Service: Ophthalmology;  Laterality: Right;  CDE:5.16   PACEMAKER INSERTION     SJM by JA    ROS- all systems are reviewed and negative except as per HPI above  Current Outpatient Medications  Medication Sig Dispense Refill   albuterol  (VENTOLIN HFA) 108 (90 Base) MCG/ACT inhaler INHALE 2 PUFFS BY MOUTH EVERY 6 HOURS AS NEEDED FOR WHEEZING OR SHORTNESS OF BREATH (Patient taking differently: Inhale 2 puffs into the lungs every 6 (six) hours as needed for shortness of breath.) 18 g 0   blood glucose meter kit and supplies Dispense based on patient and insurance preference. Use up to four times daily as directed. (FOR ICD-10 E10.9, E11.9). 1 each 0   clotrimazole-betamethasone (LOTRISONE) cream APPLY  CREAM TOPICALLY TO AFFECTED AREA TWICE DAILY (Patient taking differently: Apply 1 application  topically 2 (two) times daily.) 45 g 0   diclofenac Sodium (VOLTAREN) 1 % GEL Apply 4 g topically in the morning, at noon, and at bedtime.     diltiazem (CARDIZEM CD) 240 MG 24 hr capsule Take 1 capsule (240 mg total) by mouth daily. 90 capsule 2   esomeprazole (NEXIUM) 40 MG capsule Take 1 capsule (40 mg total) by mouth daily. 90 capsule 1   ipratropium-albuterol (DUONEB) 0.5-2.5 (3) MG/3ML SOLN Take 3 mLs by nebulization in the morning and at bedtime. 360 mL 0   levETIRAcetam (KEPPRA) 750 MG tablet Take 750 mg by mouth in the morning and at bedtime.     levothyroxine (SYNTHROID) 150 MCG tablet Take 1 tablet (150 mcg total) by mouth daily. 30 tablet 11   metoprolol tartrate (LOPRESSOR) 100 MG tablet Take 1 tablet (100 mg total) by mouth 2 (two) times daily.       nystatin (MYCOSTATIN) 100000 UNIT/ML suspension Take 5 mLs (500,000 Units total) by mouth 4 (four) times daily. Swish and swallow 60 mL 0   Vitamin D, Ergocalciferol, (DRISDOL) 1.25 MG (50000 UNIT) CAPS capsule Take 1 capsule by mouth once a week (Patient taking differently: Take 50,000 Units by mouth every 7 (seven) days.) 12 capsule 1   atorvastatin (LIPITOR) 80 MG tablet Take 1 tablet by mouth once daily (Patient not taking: Reported on 02/01/2022) 90 tablet 1   No current facility-administered medications for this visit.    Physical Exam: Vitals:   02/01/22 1600  Pulse: 76   SpO2: 92%  Height: 5' (1.524 m)    GEN- The patient is obese, chronically ill appearing, alert and oriented x 3 today.   Head- normocephalic, atraumatic Eyes-  Sclera clear, conjunctiva pink Ears- hearing intact Oropharynx- clear Lungs-  normal work of breathing Chest- pacemaker pocket is well healed Heart- iRRR GI- soft, NT, ND, + BS Extremities- no clubbing, cyanosis, or edema  Pacemaker interrogation- reviewed in detail today,  See PACEART report   ECG today: AF, LAD  Assessment and Plan:  1. Symptomatic second degree heart block Normal pacemaker function See Pace Art report No changes today she is not device dependant today 3 months to ERI.  We discussed at length today Risks, benefits, and alternatives to PPM pulse generator replacement were discussed in detail today.  The patient understands that risks include but are not limited to bleeding, infection, pneumothorax, perforation, tamponade, vascular damage, renal failure, MI, stroke, death,   damage to his existing leads, and lead dislodgement and wishes to proceed once she reaches ERI. Remote monitoring equipment requested today.  2. Permanent afib Rate controlled Coumadin discontinued due to fall and SDH.  Will need to start when approved by neurology  3. Obesity Body mass index is 51.15 kg/m. She has struggle with this for years, without improvement  4. HTN Stable No change required today  5. SDH As above  Risks, benefits and potential toxicities for medications prescribed and/or refilled reviewed with patient today.   Return for battery check in 3 months   E , MD  02/01/2022 4:30 PM  

## 2022-02-03 ENCOUNTER — Ambulatory Visit: Payer: Self-pay | Admitting: *Deleted

## 2022-02-03 ENCOUNTER — Encounter: Payer: Self-pay | Admitting: *Deleted

## 2022-02-03 NOTE — Patient Outreach (Signed)
  Care Coordination   Follow Up Visit Note   02/03/2022  Name: Megan [name] MRN: [id number] DOB: 1946-01-29  Megan [name] is a 76 y.o. year old female who sees Lenna Gilford, [place] Hawthorne, FNP for primary care. I spoke with [name] by phone today.  What matters to the patients health and wellness today?  Pursue Higher Level of Care Options.    Goals Addressed               This Visit's Progress     COMPLETED: Pursue Higher Level of Care Options. (pt-stated)   On track     Care Coordination Interventions:  Active listening utilized.  Task-centered strategies employed. Continue to encourage patient to apply for [place] Medicaid, through the [place], as payor source for long-term care facility placement. Please contact CSW directly (# [phone]), if you change your mind about wanting to continue to receive social work services and resources.      SDOH assessments and interventions completed:  Yes.  Care Coordination Interventions Activated:  Yes.   Care Coordination Interventions:  Yes, provided.   Follow up plan: No further intervention required.   Encounter Outcome:  Pt. Visit Completed.   Nat Christen, BSW, MSW, LCSW  Licensed Education officer, environmental [facility] System  Mailing [place] [address] [place], [place] [address] Physical Address-[address] [place], [place] [address] Toll Free Main # [phone] Fax # [phone] Cell # [phone] [email or web address]

## 2022-02-03 NOTE — Patient Instructions (Signed)
Visit Information  Thank you for taking time to visit with me today. Please don't hesitate to contact me if I can be of assistance to you.   Following are the goals we discussed today:   Goals Addressed               This Visit's Progress     COMPLETED: Pursue Higher Level of Care Options. (pt-stated)   On track     Care Coordination Interventions:  Active listening utilized.  Task-centered strategies employed. Continue to encourage patient to apply for [place] Medicaid, through the [place], as payor source for long-term care facility placement. Please contact CSW directly (# [phone]), if you change your mind about wanting to continue to receive social work services and resources.      Please call the care guide team at [phone] if you need to cancel or reschedule your appointment.   If you are experiencing a Mental Health or [place] or need someone to talk to, please call the Suicide and Crisis Lifeline: 988 call the [place] National Suicide Prevention Lifeline: 479-386-3319 or TTY: (325) 577-5364 TTY 971-130-9408) to talk to a trained counselor call 1-800-273-TALK (toll free, 24 hour hotline) go to [facility] [address] [place] [phone]) call the [place]: [phone] call 911  Patient verbalizes understanding of instructions and care plan provided today and agrees to view in [place]. Active MyChart status and patient understanding of how to access instructions and care plan via MyChart confirmed with patient.     No further follow up required.  Nat Christen, BSW, MSW, LCSW  Licensed Education officer, environmental [facility]  Mailing [place] [address] [place], [place] [address] Physical Address-[address], [place], [place] [address] Toll Free Main #  [phone] Fax # [phone] Cell # [phone] [email or web address]

## 2022-02-07 DIAGNOSIS — T68XXXA Hypothermia, initial encounter: Secondary | ICD-10-CM | POA: Diagnosis not present

## 2022-02-07 DIAGNOSIS — R079 Chest pain, unspecified: Secondary | ICD-10-CM | POA: Diagnosis not present

## 2022-02-07 DIAGNOSIS — R0689 Other abnormalities of breathing: Secondary | ICD-10-CM | POA: Diagnosis not present

## 2022-02-07 DIAGNOSIS — I4891 Unspecified atrial fibrillation: Secondary | ICD-10-CM | POA: Diagnosis not present

## 2022-02-07 DIAGNOSIS — I959 Hypotension, unspecified: Secondary | ICD-10-CM | POA: Diagnosis not present

## 2022-02-12 ENCOUNTER — Telehealth: Payer: Self-pay | Admitting: Family

## 2022-03-01 ENCOUNTER — Telehealth: Payer: Self-pay

## 2022-03-01 ENCOUNTER — Telehealth: Payer: Self-pay | Admitting: Cardiovascular Disease

## 2022-03-01 NOTE — Telephone Encounter (Signed)
Pt would like a callback regarding setting monitor with cell phone. Please advise

## 2022-03-01 NOTE — Telephone Encounter (Signed)
I spoke with the patient and her son was not home to help her with her monitor.

## 2022-03-01 NOTE — Telephone Encounter (Signed)
I spoke with the patient son and ordered another cell adapter for the patient monitor. I told him once he receive the adapter to give [place] a call so we can make sure the monitor is connected properly.

## 2022-03-05 ENCOUNTER — Other Ambulatory Visit: Payer: Self-pay | Admitting: Family

## 2022-03-05 MED ORDER — METOPROLOL TARTRATE 100 MG PO TABS: 100.0000 mg | ORAL_TABLET | Freq: Two times a day (BID) | ORAL | 0 refills | Status: DC

## 2022-03-05 NOTE — Telephone Encounter (Signed)
Pt aware refill sent to pharmacy 

## 2022-03-05 NOTE — Telephone Encounter (Signed)
  Prescription Request  03/05/2022  Is this a "Controlled Substance" medicine? metoprolol tartrate (LOPRESSOR) 100 MG tablet   Have you seen your PCP in the last 2 weeks? no  If YES, route message to pool  -  If NO, patient needs to be scheduled for appointment.  What is the name of the medication or equipment? metoprolol tartrate (LOPRESSOR) 100 MG tablet   Have you contacted your pharmacy to request a refill? no   Which pharmacy would you like this sent to? [facility]  Pt got this medication while in the hospital back in march.   Patient notified that their request is being sent to the clinical staff for review and that they should receive a response within 2 business days.

## 2022-03-17 NOTE — Telephone Encounter (Signed)
I tried to call the patient but got the busy signal.

## 2022-03-18 NOTE — Telephone Encounter (Signed)
I spoke with the patient and she is going to have her son call to get help with the monitor. I let her know that it is important to get the monitor working so we can check her battery. She had 3 months left on the battery in November.

## 2022-03-19 ENCOUNTER — Telehealth: Payer: Self-pay | Admitting: Cardiovascular Disease

## 2022-03-19 NOTE — Telephone Encounter (Signed)
Patient states she was supposed to get a call back from the device clinic about whether she needed to come in or just have a transmission.

## 2022-03-19 NOTE — Telephone Encounter (Signed)
Returned call to Pt.  Advised per previous discussion, son would call when cell adapter received.  Pt states it has been received but son has not plugged it in yet.  He has questions regarding plugging it in.  This nurse advised to plug in the cord and then send manual transmission.  Son would like to talk to [facility], he said he would call next Tuesday.

## 2022-03-26 ENCOUNTER — Ambulatory Visit: Payer: Medicare Other

## 2022-03-26 NOTE — Telephone Encounter (Signed)
I called the patient and tried to help her son set the monitor up but he was asleep.

## 2022-03-26 NOTE — Telephone Encounter (Signed)
Transmission received. I got her on a monthly battery check. I let her know that her battery is still at 3 months.

## 2022-03-31 ENCOUNTER — Telehealth: Payer: Self-pay

## 2022-03-31 LAB — CUP PACEART REMOTE DEVICE CHECK
Battery Remaining Longevity: 1 mo
Battery Remaining Percentage: 0.5 %
Battery Voltage: 2.62 V
Brady Statistic RV Percent Paced: 1.6 %
Date Time Interrogation Session: 20231229171541
Implantable Lead Connection Status: 753985
Implantable Lead Connection Status: 753985
Implantable Lead Implant Date: 20100728
Implantable Lead Implant Date: 20100728
Implantable Lead Location: 753859
Implantable Lead Location: 753860
Implantable Pulse Generator Implant Date: 20100728
Lead Channel Impedance Value: 400 Ohm
Lead Channel Pacing Threshold Amplitude: 1 V
Lead Channel Pacing Threshold Pulse Width: 0.4 ms
Lead Channel Sensing Intrinsic Amplitude: 9.6 mV
Lead Channel Setting Pacing Amplitude: 2 V
Lead Channel Setting Pacing Pulse Width: 0.4 ms
Lead Channel Setting Sensing Sensitivity: 2 mV
Pulse Gen Model: 2210
Pulse Gen Serial Number: 2339600

## 2022-03-31 NOTE — Telephone Encounter (Signed)
Alert received regarding device nearing ERI.   Per report:  ERI estimated in <55month.  Patient should be on monthly monitoring checks. However, appears they have been cancelled.  Will forward to [employer] to investigate.   Thanks.

## 2022-04-12 ENCOUNTER — Other Ambulatory Visit: Payer: Self-pay | Admitting: Family Medicine

## 2022-04-26 ENCOUNTER — Ambulatory Visit: Payer: Medicare Other

## 2022-04-26 DIAGNOSIS — I4821 Permanent atrial fibrillation: Secondary | ICD-10-CM

## 2022-04-27 LAB — CUP PACEART REMOTE DEVICE CHECK
Battery Remaining Longevity: 1 mo
Battery Remaining Percentage: 0.5 %
Battery Voltage: 2.62 V
Brady Statistic RV Percent Paced: 1.7 %
Date Time Interrogation Session: 20240129023336
Implantable Lead Connection Status: 753985
Implantable Lead Connection Status: 753985
Implantable Lead Implant Date: 20100728
Implantable Lead Implant Date: 20100728
Implantable Lead Location: 753859
Implantable Lead Location: 753860
Implantable Pulse Generator Implant Date: 20100728
Lead Channel Impedance Value: 400 Ohm
Lead Channel Pacing Threshold Amplitude: 1 V
Lead Channel Pacing Threshold Pulse Width: 0.4 ms
Lead Channel Sensing Intrinsic Amplitude: 10.1 mV
Lead Channel Setting Pacing Amplitude: 2 V
Lead Channel Setting Pacing Pulse Width: 0.4 ms
Lead Channel Setting Sensing Sensitivity: 2 mV
Pulse Gen Model: 2210
Pulse Gen Serial Number: 2339600

## 2022-04-30 ENCOUNTER — Telehealth: Payer: Self-pay | Admitting: Cardiovascular Disease

## 2022-04-30 NOTE — Telephone Encounter (Signed)
Spoke with patient informed her that pacemaker had not yet tripped ERI, patient voiced understanding

## 2022-04-30 NOTE — Telephone Encounter (Signed)
Pt would like a callback regarding device. Please advise

## 2022-05-03 ENCOUNTER — Other Ambulatory Visit: Payer: Self-pay | Admitting: Family

## 2022-05-03 MED ORDER — ALBUTEROL SULFATE HFA 108 (90 BASE) MCG/ACT IN AERS: INHALATION_SPRAY | RESPIRATORY_TRACT | 0 refills | Status: DC

## 2022-05-03 NOTE — Telephone Encounter (Signed)
  Prescription Request  05/03/2022  Is this a "Controlled Substance" medicine? albuterol (VENTOLIN HFA) 108 (90 Base) MCG/ACT inhaler   Have you seen your PCP in the last 2 weeks? no  If YES, route message to pool  -  If NO, patient needs to be scheduled for appointment.  What is the name of the medication or equipment? albuterol (VENTOLIN HFA) 108 (90 Base) MCG/ACT inhaler   Have you contacted your pharmacy to request a refill? Pt says she called the pharmacy   Which pharmacy would you like this sent to? [facility]   Patient notified that their request is being sent to the clinical staff for review and that they should receive a response within 2 business days.

## 2022-05-03 NOTE — Telephone Encounter (Signed)
NA/NVM refill sent to pharmacy

## 2022-05-03 NOTE — Telephone Encounter (Signed)
Please advise Last RF on Albuterol INH was 01/01/21. OV 11/16/21 for SOB states continue nebulizer, before this 01/20/21 dx for acute bronchitis, no other OV's w/ Dx's for refill

## 2022-05-05 ENCOUNTER — Telehealth: Payer: Self-pay | Admitting: Family

## 2022-05-05 MED ORDER — ALBUTEROL SULFATE HFA 108 (90 BASE) MCG/ACT IN AERS: INHALATION_SPRAY | RESPIRATORY_TRACT | 0 refills | Status: DC

## 2022-05-05 NOTE — Telephone Encounter (Signed)
RX is done

## 2022-05-05 NOTE — Addendum Note (Signed)
Addended by: Antonietta Barcelona D on: 05/05/2022 11:04 AM   Modules accepted: Orders

## 2022-05-11 ENCOUNTER — Telehealth: Payer: Self-pay | Admitting: Family

## 2022-05-11 NOTE — Telephone Encounter (Signed)
Called patient she needs to be evaluated in person states she can not. Patient having issues with breathing. Patient states she is bed bound and can not come in and she needs to call EMS to be evaluated. Patient aware and verbalized understanding.

## 2022-05-19 ENCOUNTER — Telehealth: Payer: Self-pay | Admitting: Cardiovascular Disease

## 2022-05-19 NOTE — Telephone Encounter (Signed)
Patient would like to know how much time she has remaining on the battery life of her PPM. No answer from [facility]. Please advise.

## 2022-05-19 NOTE — Telephone Encounter (Signed)
Returned call to Pt.  Advised she had less than 3 months to ERI.  She understands.

## 2022-05-27 ENCOUNTER — Telehealth: Payer: Self-pay

## 2022-05-27 ENCOUNTER — Ambulatory Visit: Payer: Medicare Other

## 2022-05-27 DIAGNOSIS — I441 Atrioventricular block, second degree: Secondary | ICD-10-CM

## 2022-05-27 LAB — CUP PACEART REMOTE DEVICE CHECK
Battery Remaining Longevity: 1 mo
Battery Remaining Percentage: 0.5 %
Battery Voltage: 2.6 V
Brady Statistic RV Percent Paced: 2.4 %
Date Time Interrogation Session: 20240229044400
Implantable Lead Connection Status: 753985
Implantable Lead Connection Status: 753985
Implantable Lead Implant Date: 20100728
Implantable Lead Implant Date: 20100728
Implantable Lead Location: 753859
Implantable Lead Location: 753860
Implantable Pulse Generator Implant Date: 20100728
Lead Channel Impedance Value: 400 Ohm
Lead Channel Pacing Threshold Amplitude: 1 V
Lead Channel Pacing Threshold Pulse Width: 0.4 ms
Lead Channel Sensing Intrinsic Amplitude: 10.8 mV
Lead Channel Setting Pacing Amplitude: 2 V
Lead Channel Setting Pacing Pulse Width: 0.4 ms
Lead Channel Setting Sensing Sensitivity: 2 mV
Pulse Gen Model: 2210
Pulse Gen Serial Number: 2339600

## 2022-05-27 NOTE — Telephone Encounter (Signed)
Scheduled remote reviewed. Normal device function.   Device has reached ERI - route to triage Next remote to be determined [place]  DEVICE NOW AT ERI: I did confirm this with Gaspar Bidding from [facility] as it doesn't give [place] a trigger date, but he says for insurance purposes below is at the covered mark and we can go ahead and schedule. Likely will trigger the mark in next day or 2.   Patient has been called and notified to expect a call in the next few days to set up scheduling for the gen change.  Appears Dr. Myles Gip reviewed everything with her at her November appt. Will flag for Dr. Myles Gip and nurse Drue Dun and scheduling team.

## 2022-05-31 ENCOUNTER — Encounter (HOSPITAL_COMMUNITY): Payer: Self-pay

## 2022-05-31 ENCOUNTER — Inpatient Hospital Stay (HOSPITAL_COMMUNITY)
Admission: RE | Admit: 2022-05-31 | Discharge: 2022-06-05 | DRG: 270 | Disposition: A | Discharge status: Skilled Nursing Facility | Payer: Medicare Other | Attending: Family Medicine | Admitting: Family Medicine

## 2022-05-31 ENCOUNTER — Other Ambulatory Visit: Payer: Self-pay | Admitting: Internal Medicine

## 2022-05-31 DIAGNOSIS — M6281 Muscle weakness (generalized): Secondary | ICD-10-CM | POA: Diagnosis present

## 2022-05-31 DIAGNOSIS — I5032 Chronic diastolic (congestive) heart failure: Secondary | ICD-10-CM | POA: Diagnosis present

## 2022-05-31 DIAGNOSIS — Z87891 Personal history of nicotine dependence: Secondary | ICD-10-CM | POA: Diagnosis not present

## 2022-05-31 DIAGNOSIS — G4733 Obstructive sleep apnea (adult) (pediatric): Secondary | ICD-10-CM | POA: Diagnosis present

## 2022-05-31 DIAGNOSIS — J984 Other disorders of lung: Secondary | ICD-10-CM | POA: Diagnosis present

## 2022-05-31 DIAGNOSIS — Z823 Family history of stroke: Secondary | ICD-10-CM | POA: Diagnosis not present

## 2022-05-31 DIAGNOSIS — I10 Essential (primary) hypertension: Secondary | ICD-10-CM | POA: Diagnosis present

## 2022-05-31 DIAGNOSIS — Z7401 Bed confinement status: Secondary | ICD-10-CM

## 2022-05-31 DIAGNOSIS — I70222 Atherosclerosis of native arteries of extremities with rest pain, left leg: Secondary | ICD-10-CM | POA: Diagnosis not present

## 2022-05-31 DIAGNOSIS — R609 Edema, unspecified: Secondary | ICD-10-CM | POA: Diagnosis not present

## 2022-05-31 DIAGNOSIS — I495 Sick sinus syndrome: Secondary | ICD-10-CM | POA: Diagnosis present

## 2022-05-31 DIAGNOSIS — I361 Nonrheumatic tricuspid (valve) insufficiency: Secondary | ICD-10-CM | POA: Diagnosis not present

## 2022-05-31 DIAGNOSIS — I509 Heart failure, unspecified: Secondary | ICD-10-CM

## 2022-05-31 DIAGNOSIS — M79606 Pain in leg, unspecified: Secondary | ICD-10-CM | POA: Diagnosis present

## 2022-05-31 DIAGNOSIS — M1712 Unilateral primary osteoarthritis, left knee: Secondary | ICD-10-CM | POA: Diagnosis present

## 2022-05-31 DIAGNOSIS — R5383 Other fatigue: Secondary | ICD-10-CM | POA: Diagnosis not present

## 2022-05-31 DIAGNOSIS — M1991 Primary osteoarthritis, unspecified site: Secondary | ICD-10-CM | POA: Diagnosis present

## 2022-05-31 DIAGNOSIS — I890 Lymphedema, not elsewhere classified: Secondary | ICD-10-CM | POA: Diagnosis present

## 2022-05-31 DIAGNOSIS — Z87828 Personal history of other (healed) physical injury and trauma: Secondary | ICD-10-CM | POA: Diagnosis not present

## 2022-05-31 DIAGNOSIS — J440 Chronic obstructive pulmonary disease with acute lower respiratory infection: Secondary | ICD-10-CM | POA: Diagnosis not present

## 2022-05-31 DIAGNOSIS — E782 Mixed hyperlipidemia: Secondary | ICD-10-CM | POA: Diagnosis present

## 2022-05-31 DIAGNOSIS — M79622 Pain in left upper arm: Secondary | ICD-10-CM | POA: Diagnosis not present

## 2022-05-31 DIAGNOSIS — G40909 Epilepsy, unspecified, not intractable, without status epilepticus: Secondary | ICD-10-CM | POA: Diagnosis not present

## 2022-05-31 DIAGNOSIS — E079 Disorder of thyroid, unspecified: Secondary | ICD-10-CM | POA: Diagnosis not present

## 2022-05-31 DIAGNOSIS — Z66 Do not resuscitate: Secondary | ICD-10-CM | POA: Diagnosis not present

## 2022-05-31 DIAGNOSIS — I872 Venous insufficiency (chronic) (peripheral): Secondary | ICD-10-CM | POA: Diagnosis present

## 2022-05-31 DIAGNOSIS — Z79890 Hormone replacement therapy (postmenopausal): Secondary | ICD-10-CM | POA: Diagnosis not present

## 2022-05-31 DIAGNOSIS — I63412 Cerebral infarction due to embolism of left middle cerebral artery: Secondary | ICD-10-CM | POA: Diagnosis present

## 2022-05-31 DIAGNOSIS — M79605 Pain in left leg: Secondary | ICD-10-CM

## 2022-05-31 DIAGNOSIS — R296 Repeated falls: Secondary | ICD-10-CM | POA: Diagnosis present

## 2022-05-31 DIAGNOSIS — R1311 Dysphagia, oral phase: Secondary | ICD-10-CM | POA: Diagnosis present

## 2022-05-31 DIAGNOSIS — J189 Pneumonia, unspecified organism: Secondary | ICD-10-CM | POA: Diagnosis not present

## 2022-05-31 DIAGNOSIS — S065XAD Traumatic subdural hemorrhage with loss of consciousness status unknown, subsequent encounter: Secondary | ICD-10-CM | POA: Diagnosis present

## 2022-05-31 DIAGNOSIS — I63542 Cerebral infarction due to unspecified occlusion or stenosis of left cerebellar artery: Secondary | ICD-10-CM | POA: Diagnosis present

## 2022-05-31 DIAGNOSIS — E039 Hypothyroidism, unspecified: Secondary | ICD-10-CM | POA: Diagnosis present

## 2022-05-31 DIAGNOSIS — I4821 Permanent atrial fibrillation: Secondary | ICD-10-CM | POA: Diagnosis not present

## 2022-05-31 DIAGNOSIS — M79662 Pain in left lower leg: Secondary | ICD-10-CM | POA: Diagnosis not present

## 2022-05-31 DIAGNOSIS — I70202 Unspecified atherosclerosis of native arteries of extremities, left leg: Secondary | ICD-10-CM | POA: Diagnosis not present

## 2022-05-31 DIAGNOSIS — D72829 Elevated white blood cell count, unspecified: Secondary | ICD-10-CM | POA: Diagnosis present

## 2022-05-31 DIAGNOSIS — I743 Embolism and thrombosis of arteries of the lower extremities: Secondary | ICD-10-CM | POA: Diagnosis not present

## 2022-05-31 DIAGNOSIS — I6523 Occlusion and stenosis of bilateral carotid arteries: Secondary | ICD-10-CM | POA: Diagnosis not present

## 2022-05-31 DIAGNOSIS — Z597 Insufficient social insurance and welfare support: Secondary | ICD-10-CM

## 2022-05-31 DIAGNOSIS — R531 Weakness: Secondary | ICD-10-CM | POA: Diagnosis not present

## 2022-05-31 DIAGNOSIS — R9082 White matter disease, unspecified: Secondary | ICD-10-CM | POA: Diagnosis not present

## 2022-05-31 DIAGNOSIS — Z6841 Body Mass Index (BMI) 40.0 and over, adult: Secondary | ICD-10-CM

## 2022-05-31 DIAGNOSIS — E119 Type 2 diabetes mellitus without complications: Secondary | ICD-10-CM | POA: Diagnosis not present

## 2022-05-31 DIAGNOSIS — I63511 Cerebral infarction due to unspecified occlusion or stenosis of right middle cerebral artery: Secondary | ICD-10-CM | POA: Diagnosis present

## 2022-05-31 DIAGNOSIS — I482 Chronic atrial fibrillation, unspecified: Secondary | ICD-10-CM | POA: Diagnosis not present

## 2022-05-31 DIAGNOSIS — I69391 Dysphagia following cerebral infarction: Secondary | ICD-10-CM | POA: Diagnosis not present

## 2022-05-31 DIAGNOSIS — Z820 Family history of epilepsy and other diseases of the nervous system: Secondary | ICD-10-CM

## 2022-05-31 DIAGNOSIS — Z9181 History of falling: Secondary | ICD-10-CM

## 2022-05-31 DIAGNOSIS — Z950 Presence of cardiac pacemaker: Secondary | ICD-10-CM | POA: Diagnosis not present

## 2022-05-31 DIAGNOSIS — K219 Gastro-esophageal reflux disease without esophagitis: Secondary | ICD-10-CM | POA: Diagnosis present

## 2022-05-31 DIAGNOSIS — I110 Hypertensive heart disease with heart failure: Secondary | ICD-10-CM | POA: Diagnosis present

## 2022-05-31 DIAGNOSIS — M25562 Pain in left knee: Secondary | ICD-10-CM | POA: Diagnosis not present

## 2022-05-31 DIAGNOSIS — J449 Chronic obstructive pulmonary disease, unspecified: Secondary | ICD-10-CM | POA: Diagnosis present

## 2022-05-31 DIAGNOSIS — F411 Generalized anxiety disorder: Secondary | ICD-10-CM | POA: Diagnosis present

## 2022-05-31 DIAGNOSIS — Z79899 Other long term (current) drug therapy: Secondary | ICD-10-CM

## 2022-05-31 DIAGNOSIS — I75022 Atheroembolism of left lower extremity: Secondary | ICD-10-CM | POA: Diagnosis not present

## 2022-05-31 DIAGNOSIS — I739 Peripheral vascular disease, unspecified: Secondary | ICD-10-CM | POA: Diagnosis not present

## 2022-05-31 DIAGNOSIS — I700 Atherosclerosis of aorta: Secondary | ICD-10-CM | POA: Diagnosis not present

## 2022-05-31 DIAGNOSIS — R4189 Other symptoms and signs involving cognitive functions and awareness: Secondary | ICD-10-CM | POA: Diagnosis present

## 2022-05-31 DIAGNOSIS — I639 Cerebral infarction, unspecified: Secondary | ICD-10-CM | POA: Diagnosis not present

## 2022-05-31 DIAGNOSIS — Z23 Encounter for immunization: Secondary | ICD-10-CM | POA: Diagnosis not present

## 2022-05-31 DIAGNOSIS — I998 Other disorder of circulatory system: Secondary | ICD-10-CM | POA: Diagnosis not present

## 2022-05-31 DIAGNOSIS — I63411 Cerebral infarction due to embolism of right middle cerebral artery: Secondary | ICD-10-CM | POA: Diagnosis not present

## 2022-05-31 DIAGNOSIS — E785 Hyperlipidemia, unspecified: Secondary | ICD-10-CM | POA: Diagnosis not present

## 2022-05-31 DIAGNOSIS — R911 Solitary pulmonary nodule: Secondary | ICD-10-CM | POA: Diagnosis not present

## 2022-05-31 DIAGNOSIS — R000 Tachycardia, unspecified: Secondary | ICD-10-CM | POA: Diagnosis not present

## 2022-05-31 DIAGNOSIS — I959 Hypotension, unspecified: Secondary | ICD-10-CM | POA: Diagnosis not present

## 2022-05-31 DIAGNOSIS — M1990 Unspecified osteoarthritis, unspecified site: Secondary | ICD-10-CM | POA: Diagnosis not present

## 2022-05-31 DIAGNOSIS — M79661 Pain in right lower leg: Secondary | ICD-10-CM | POA: Diagnosis not present

## 2022-05-31 DIAGNOSIS — I69319 Unspecified symptoms and signs involving cognitive functions following cerebral infarction: Secondary | ICD-10-CM | POA: Diagnosis present

## 2022-05-31 DIAGNOSIS — Z7901 Long term (current) use of anticoagulants: Secondary | ICD-10-CM | POA: Diagnosis not present

## 2022-05-31 DIAGNOSIS — J90 Pleural effusion, not elsewhere classified: Secondary | ICD-10-CM | POA: Diagnosis not present

## 2022-05-31 MED ORDER — ACETAMINOPHEN 325 MG PO TABS
650.0000 mg | ORAL_TABLET | Freq: Four times a day (QID) | ORAL | Status: DC | PRN
  Administered 2022-06-01: 650 mg via ORAL
  Filled 2022-05-31 (×2): qty 2

## 2022-05-31 MED ORDER — HYDROCODONE-ACETAMINOPHEN 5-325 MG PO TABS: 1.0000 | ORAL_TABLET | Freq: Four times a day (QID) | ORAL | Status: DC | PRN

## 2022-05-31 NOTE — Progress Notes (Signed)
Plan of Care Note for accepted transfer   Patient: Megan [name] MRN: [id number]   DOA: (Not on file)  Facility requesting transfer: [facility] Requesting Provider: EDP Reason for transfer: Patient requesting, Acute distal L LE arterial occlusion. Facility course:   Patient present with left leg pain around the popliteal fossa.  Was able to Doppler pulse.  CT runoff performed which showed left P3 acute appearing occlusion extending to the trifurcation vessels with some reconstitution beyond this.  Patient had previously been on warfarin in the setting of A-fib but this had been discontinued after a fall and a subdural bleed.  Patient currently stable in the ED.  Requesting services at [facility] if admission warranted based on vascular surgery review of chart.  She is due to have her pacemaker changed per EDP/patient.  Has received 500 cc IV fluids, oxycodone, tramadol there.  Plan of care: The patient is tentatively accepted for admission (pending approval/acceptance by vascular surgery) to Telemetry unit, at [facility]  Author: Marcelyn Bruins, MD 05/31/2022  Check [email or web address] for on-call coverage.  Nursing staff, Please call [place] number on Amion as soon as patient's arrival, so appropriate admitting provider can evaluate the pt.

## 2022-05-31 NOTE — H&P (Signed)
History and Physical    Megan [name] [id number] DOB: 1945-04-25 DOA: 05/31/2022  PCP: Sharion Balloon, FNP  Patient coming from: Outside Hospital  Chief Complaint: Left leg pain  HPI: Megan [name] is a 77 y.o. female with medical history significant of symptomatic second-degree AV block status post PPM, permanent A-fib not on anticoagulation due to history of fall and SDH, class III obesity (BMI 41.81), hypertension, chronic HFpEF, anxiety, COPD, GERD, hyperlipidemia, lymphedema, OSA, hypothyroidism, PVD presented to [facility] ED today with left leg pain.  Pulses dopplerable in the ED.  Labs showing WBC 12.2, hemoglobin 14.8, platelet count 173k, sodium 133, potassium 4.1, chloride 98, bicarb 26, BUN 9, creatinine 0.9, glucose 99.  CTA abdominal aorta and bilateral iliofemoral runoff W Wo contrast showing: "05/31/2022 2:08 PM EST  VASCULAR  1. Positive for acute appearing occlusion of the distal left popliteal artery beginning at the P3 segment and extending into the trifurcation vessels. The anterior tibial and peroneal arteries remain patent but the posterior tibial artery appears occluded proximally. The PTA does reconstitute via collateral flow. 2. Possible mild to moderate focal stenosis of the right distal external iliac artery due to circumferential calcified atherosclerotic plaque. 3. Mild bilateral renal artery stenoses. 4. Mild celiac artery origin stenosis. 5. Scattered aortic atherosclerotic calcifications without aneurysm or dissection. Aortic Atherosclerosis (ICD10-I70.0).  NON-VASCULAR  1. Slightly limited evaluation of the lung bases and upper abdomen due to motion artifact. 2. No acute abnormality in the abdomen or pelvis. 3. Colonic diverticular disease without CT evidence of active inflammation. 4. Cardiomegaly with cardiac rhythm maintenance device in place. 5. Small right pleural effusion. 6. Multilevel degenerative disc disease."  X-ray  of left knee showing: "05/31/2022 9:16 AM EST  Moderate to severe medial compartment and mild patellofemoral compartment osteoarthritis."  Patient received Percocet 5-325 mg, tramadol 50 mg, 500 cc normal saline in the ED. Patient sent to [facility] for vascular surgery evaluation.  Also due for pacemaker change and needs cardiology evaluation.    Patient reports 3-day history of pain in her left leg just behind her knee.  Pain is nonexertional as she is mostly bedbound.  States she used to be on Coumadin for A-fib which was stopped last year in May due to history of brain bleed.  Patient is also concerned that her pacemaker is running out of battery.  States she saw her cardiologist back in November and was told that the battery needed to be replaced soon but she does not have a follow-up visit with them.  She is requesting if this could be looked into while she is in the hospital.  No other complaints.  Denies fevers, cough, shortness of breath, chest pain, nausea, vomiting, abdominal pain, or diarrhea.  Review of Systems:  Review of Systems  All other systems reviewed and are negative.   Past Medical History:  Diagnosis Date   Acute diastolic heart failure (HCC)    Anxiety state, unspecified    Asthma    Atrial fibrillation (HCC)    Congestive heart failure, unspecified    Degenerative joint disease    GERD (gastroesophageal reflux disease)    Hyperlipidemia    Hypertension    Lymphedema    Obesity    OSA (obstructive sleep apnea) 06/09/2015   Persistent atrial fibrillation (HCC)     post-termination pauses with afib s/p PPM   Presence of permanent cardiac pacemaker    Sinoatrial node dysfunction (HCC)    s/p PPM  Sleep apnea    compliant with CPAP   Unspecified hypothyroidism    Unspecified venous (peripheral) insufficiency     Past Surgical History:  Procedure Laterality Date   CATARACT EXTRACTION W/PHACO Left 09/09/2014   Procedure: CATARACT EXTRACTION PHACO  AND INTRAOCULAR LENS PLACEMENT ([facility]);  Surgeon: Tonny Branch, MD;  Location: [facility];  Service: Ophthalmology;  Laterality: Left;  CDE: 6.99   CATARACT EXTRACTION W/PHACO Right 10/07/2014   Procedure: CATARACT EXTRACTION PHACO AND INTRAOCULAR LENS PLACEMENT RIGHT EYE;  Surgeon: Tonny Branch, MD;  Location: [facility];  Service: Ophthalmology;  Laterality: Right;  CDE:5.16   PACEMAKER INSERTION     SJM by JA     reports that she quit smoking about 44 years ago. Her smoking use included cigarettes. She has a 5.00 pack-year smoking history. She has been exposed to tobacco smoke. She has never used smokeless tobacco. She reports that she does not drink alcohol and does not use drugs.  No Known Allergies  Family History  Problem Relation Age of Onset   Parkinson's disease Other    CVA Other    Parkinson's disease Father     Prior to Admission medications   Medication Sig Start Date End Date Taking? Authorizing Provider  albuterol (VENTOLIN HFA) 108 (90 Base) MCG/ACT inhaler INHALE 2 PUFFS BY MOUTH EVERY 6 HOURS AS NEEDED FOR WHEEZING OR SHORTNESS OF BREATH Strength 108 (90 Base) MCG/ACT 05/05/22   Sharion Balloon, FNP  atorvastatin (LIPITOR) 80 MG tablet Take 1 tablet by mouth once daily Patient not taking: Reported on 02/01/2022 03/20/21   Sharion Balloon, FNP  blood glucose meter kit and supplies Dispense based on patient and insurance preference. Use up to four times daily as directed. (FOR ICD-10 E10.9, E11.9). 06/09/20   Evelina Dun A, FNP  clotrimazole-betamethasone (LOTRISONE) cream APPLY  CREAM TOPICALLY TO AFFECTED AREA TWICE DAILY Patient taking differently: Apply 1 application  topically 2 (two) times daily. 03/06/21   Evelina Dun A, FNP  diclofenac Sodium (VOLTAREN) 1 % GEL Apply 4 g topically in the morning, at noon, and at bedtime.    [provider]  diltiazem (CARDIZEM CD) 240 MG 24 hr capsule Take 1 capsule (240 mg total) by mouth daily. 01/19/22   Sharion Balloon, FNP   esomeprazole (NEXIUM) 40 MG capsule Take 1 capsule (40 mg total) by mouth daily. 12/18/20   Evelina Dun A, FNP  ipratropium-albuterol (DUONEB) 0.5-2.5 (3) MG/3ML SOLN Take 3 mLs by nebulization in the morning and at bedtime. 08/05/21   Sharion Balloon, FNP  levETIRAcetam (KEPPRA) 750 MG tablet Take 750 mg by mouth in the morning and at bedtime. 08/21/21   [provider]  levothyroxine (SYNTHROID) 150 MCG tablet Take 1 tablet (150 mcg total) by mouth daily. 09/21/21 09/21/22  Sharion Balloon, FNP  metoprolol tartrate (LOPRESSOR) 100 MG tablet Take 1 tablet by mouth twice daily 04/13/22   Evelina Dun A, FNP  nystatin (MYCOSTATIN) 100000 UNIT/ML suspension Take 5 mLs (500,000 Units total) by mouth 4 (four) times daily. Swish and swallow 12/14/21   Gwenlyn Perking, FNP  Vitamin D, Ergocalciferol, (DRISDOL) 1.25 MG (50000 UNIT) CAPS capsule Take 1 capsule by mouth once a week Patient taking differently: Take 50,000 Units by mouth every 7 (seven) days. 01/01/21   Sharion Balloon, FNP    Physical Exam: Vitals:   05/31/22 1950  BP: 129/65  Pulse: (!) 102  Resp: (!) 22  Temp: 97.9 F (36.6 C)  TempSrc:  Oral  SpO2: 94%  Weight: 97.1 kg    Physical Exam Vitals reviewed.  Constitutional:      General: She is not in acute distress. HENT:     Head: Normocephalic and atraumatic.  Eyes:     Extraocular Movements: Extraocular movements intact.  Cardiovascular:     Rate and Rhythm: Normal rate and regular rhythm.  Pulmonary:     Effort: Pulmonary effort is normal. No respiratory distress.     Breath sounds: Normal breath sounds. No wheezing or rales.  Abdominal:     General: Bowel sounds are normal. There is no distension.     Palpations: Abdomen is soft.     Tenderness: There is no abdominal tenderness.  Musculoskeletal:     Cervical back: Normal range of motion.  Skin:    General: Skin is warm and dry.     Comments: Chronic venous stasis dermatitis of bilateral lower  extremities  Neurological:     General: No focal deficit present.     Mental Status: She is alert and oriented to person, place, and time.     Labs on Admission: I have personally reviewed following labs and imaging studies  CBC: No results for input(s): "WBC", "NEUTROABS", "HGB", "HCT", "MCV", "PLT" in the last 168 hours. Basic Metabolic Panel: No results for input(s): "NA", "K", "CL", "CO2", "GLUCOSE", "BUN", "CREATININE", "CALCIUM", "MG", "PHOS" in the last 168 hours. GFR: CrCl cannot be calculated (Patient's most recent lab result is older than the maximum 21 days allowed.). Liver Function Tests: No results for input(s): "AST", "ALT", "ALKPHOS", "BILITOT", "PROT", "ALBUMIN" in the last 168 hours. No results for input(s): "LIPASE", "AMYLASE" in the last 168 hours. No results for input(s): "AMMONIA" in the last 168 hours. Coagulation Profile: No results for input(s): "INR", "PROTIME" in the last 168 hours. Cardiac Enzymes: No results for input(s): "CKTOTAL", "CKMB", "CKMBINDEX", "TROPONINI" in the last 168 hours. BNP (last 3 results) No results for input(s): "PROBNP" in the last 8760 hours. HbA1C: No results for input(s): "HGBA1C" in the last 72 hours. CBG: No results for input(s): "GLUCAP" in the last 168 hours. Lipid Profile: No results for input(s): "CHOL", "HDL", "LDLCALC", "TRIG", "CHOLHDL", "LDLDIRECT" in the last 72 hours. Thyroid Function Tests: No results for input(s): "TSH", "T4TOTAL", "FREET4", "T3FREE", "THYROIDAB" in the last 72 hours. Anemia Panel: No results for input(s): "VITAMINB12", "FOLATE", "FERRITIN", "TIBC", "IRON", "RETICCTPCT" in the last 72 hours. Urine analysis:    Component Value Date/Time   COLORURINE YELLOW 05/17/2021 1822   APPEARANCEUR CLOUDY (A) 05/17/2021 1822   APPEARANCEUR Cloudy (A) 04/02/2021 1448   LABSPEC 1.025 05/17/2021 1822   PHURINE 5.0 05/17/2021 1822   GLUCOSEU NEGATIVE 05/17/2021 1822   HGBUR SMALL (A) 05/17/2021 1822    BILIRUBINUR NEGATIVE 05/17/2021 1822   BILIRUBINUR Negative 04/02/2021 1448   KETONESUR 5 (A) 05/17/2021 1822   PROTEINUR 30 (A) 05/17/2021 1822   UROBILINOGEN 0.2 02/01/2014 1811   NITRITE NEGATIVE 05/17/2021 1822   LEUKOCYTESUR MODERATE (A) 05/17/2021 1822    Radiological Exams on Admission: No results found.  Assessment and Plan  Peripheral vascular disease, occlusion of the distal left popliteal artery -Patient presenting with complaint of left leg pain behind the knee x 3 days.  Currently resting comfortably and pain-free. -CTA abdominal aorta and bilateral iliofemoral runoff W Wo contrast showing acute appearing occlusion of the distal left popliteal artery beginning at the P3 segment and extending into the trifurcation vessels. The anterior tibial and peroneal arteries remain patent but the posterior  tibial artery appears occluded proximally. The PTA does reconstitute via collateral flow. Possible mild to moderate focal stenosis of the right distal external iliac artery due to circumferential calcified atherosclerotic plaque. Mild bilateral renal artery stenoses. Mild celiac artery origin stenosis. Scattered aortic atherosclerotic calcifications without aneurysm or dissection. Aortic Atherosclerosis. -Patient seen by Dr. Donzetta Matters with vascular surgery who feels that the popliteal artery occlusion is chronic rather than acute, recommending holding off starting anticoagulation at this time and will obtain ABIs in the morning.  Mild leukocytosis Likely reactive, patient is afebrile.  No infectious signs or symptoms.  Repeat CBC in a.m.  History of symptomatic second-degree AV block status post PPM Reportedly due for pacemaker battery replacement.  Consult cardiology in the morning.  Continue cardiac monitoring.  Permanent A-fib Not on anticoagulation due to history of fall and SDH.  Cardiac monitoring, EKG. Pharmacy med rec pending.  Chronic HFpEF CT showing small right pleural effusion.   Check BNP and monitor volume status closely.  Hypertension: Currently normotensive. Anxiety COPD: Stable, no signs of acute exacerbation. GERD Hyperlipidemia Hypothyroidism Seizure disorder Pharmacy med rec pending.  Code Status: DNR/DNI (discussed with the patient) Family Communication: No family available at this time. Level of care: Telemetry bed Admission status: It is my clinical opinion that referral for OBSERVATION is reasonable and necessary in this patient based on the above information provided. The aforementioned taken together are felt to place the patient at high risk for further clinical deterioration. However, it is anticipated that the patient may be medically stable for discharge from the hospital within 24 to 48 hours.   Shela Leff MD [facility]  If 7PM-7AM, please contact night-coverage [email or web address]  05/31/2022, 8:39 PM

## 2022-05-31 NOTE — Consult Note (Signed)
Hospital Consult    Reason for Consult: Acute occluded left popliteal artery Referring Physician: Dr. Marlowe Sax MRN #:  [id number]  History of Present Illness: This is a 77 y.o. female with history of vascular disease does have atrial fibrillation history of pacemaker.  Previously was on Coumadin but was discontinued last May for traumatic intracerebral hemorrhage.  She has been having knee pain presented to [facility] today underwent CTA which demonstrated concern for acute occlusion of the left popliteal artery.  She states that this time she is not having any pain.  States the pain has been present for approximately 3 to 4 days and is intermittent in nature without any exacerbating or alleviating factors.  Patient has been nonambulatory since last June after her head bleed.  Currently does not take any antiplatelet agents or anticoagulants.  She states that her left foot feels fine and is both sensory and motor at baseline.  She does have chronic swelling of both of her legs and states that she had negative DVT study in 1992 and denies any previous personal or family history of clotting disorder.  Past Medical History:  Diagnosis Date   Acute diastolic heart failure (HCC)    Anxiety state, unspecified    Asthma    Atrial fibrillation (HCC)    Congestive heart failure, unspecified    Degenerative joint disease    GERD (gastroesophageal reflux disease)    Hyperlipidemia    Hypertension    Lymphedema    Obesity    OSA (obstructive sleep apnea) 06/09/2015   Persistent atrial fibrillation (HCC)     post-termination pauses with afib s/p PPM   Presence of permanent cardiac pacemaker    Sinoatrial node dysfunction ([place])    s/p PPM   Sleep apnea    compliant with CPAP   Unspecified hypothyroidism    Unspecified venous (peripheral) insufficiency     Past Surgical History:  Procedure Laterality Date   CATARACT EXTRACTION W/PHACO Left 09/09/2014   Procedure: CATARACT EXTRACTION PHACO  AND INTRAOCULAR LENS PLACEMENT ([place]);  Surgeon: Tonny Branch, MD;  Location: [facility];  Service: Ophthalmology;  Laterality: Left;  CDE: 6.99   CATARACT EXTRACTION W/PHACO Right 10/07/2014   Procedure: CATARACT EXTRACTION PHACO AND INTRAOCULAR LENS PLACEMENT RIGHT EYE;  Surgeon: Tonny Branch, MD;  Location: [facility];  Service: Ophthalmology;  Laterality: Right;  CDE:5.16   PACEMAKER INSERTION     SJM by JA    No Known Allergies  Prior to Admission medications   Medication Sig Start Date End Date Taking? Authorizing Provider  albuterol (VENTOLIN HFA) 108 (90 Base) MCG/ACT inhaler INHALE 2 PUFFS BY MOUTH EVERY 6 HOURS AS NEEDED FOR WHEEZING OR SHORTNESS OF BREATH Strength 108 (90 Base) MCG/ACT 05/05/22   Sharion Balloon, FNP  atorvastatin (LIPITOR) 80 MG tablet Take 1 tablet by mouth once daily Patient not taking: Reported on 02/01/2022 03/20/21   Sharion Balloon, FNP  blood glucose meter kit and supplies Dispense based on patient and insurance preference. Use up to four times daily as directed. (FOR ICD-10 E10.9, E11.9). 06/09/20   Evelina Dun A, FNP  clotrimazole-betamethasone (LOTRISONE) cream APPLY  CREAM TOPICALLY TO AFFECTED AREA TWICE DAILY Patient taking differently: Apply 1 application  topically 2 (two) times daily. 03/06/21   Evelina Dun A, FNP  diclofenac Sodium (VOLTAREN) 1 % GEL Apply 4 g topically in the morning, at noon, and at bedtime.    [provider]  diltiazem (CARDIZEM CD) 240 MG 24 hr capsule Take  1 capsule (240 mg total) by mouth daily. 01/19/22   Sharion Balloon, FNP  esomeprazole (NEXIUM) 40 MG capsule Take 1 capsule (40 mg total) by mouth daily. 12/18/20   Evelina Dun A, FNP  ipratropium-albuterol (DUONEB) 0.5-2.5 (3) MG/3ML SOLN Take 3 mLs by nebulization in the morning and at bedtime. 08/05/21   Sharion Balloon, FNP  levETIRAcetam (KEPPRA) 750 MG tablet Take 750 mg by mouth in the morning and at bedtime. 08/21/21   [provider]  levothyroxine  (SYNTHROID) 150 MCG tablet Take 1 tablet (150 mcg total) by mouth daily. 09/21/21 09/21/22  Sharion Balloon, FNP  metoprolol tartrate (LOPRESSOR) 100 MG tablet Take 1 tablet by mouth twice daily 04/13/22   Evelina Dun A, FNP  nystatin (MYCOSTATIN) 100000 UNIT/ML suspension Take 5 mLs (500,000 Units total) by mouth 4 (four) times daily. Swish and swallow 12/14/21   Gwenlyn Perking, FNP  Vitamin D, Ergocalciferol, (DRISDOL) 1.25 MG (50000 UNIT) CAPS capsule Take 1 capsule by mouth once a week Patient taking differently: Take 50,000 Units by mouth every 7 (seven) days. 01/01/21   Sharion Balloon, FNP    Social History   Socioeconomic History   Marital status: Widowed    Spouse name: Not on file   Number of children: 1   Years of education: 12   Highest education level: High school graduate  Occupational History   Occupation: RETIRED    Employer: RETIRED  Tobacco Use   Smoking status: Former    Packs/day: 0.50    Years: 10.00    Total pack years: 5.00    Types: Cigarettes    Quit date: 03/29/1978    Years since quitting: 44.2    Passive exposure: Past   Smokeless tobacco: Never  Vaping Use   Vaping Use: Never used  Substance and Sexual Activity   Alcohol use: No    Alcohol/week: 0.0 standard drinks of alcohol   Drug use: No   Sexual activity: Not Currently    Partners: Male    Birth control/protection: None  Other Topics Concern   Not on file  Social History Narrative   Her son lives with her and helps her a lot   She can barely walk   Social Determinants of Health   Financial Resource Strain: Low Risk  (12/14/2021)   Overall Financial Resource Strain (CARDIA)    Difficulty of Paying Living Expenses: Not hard at all  Food Insecurity: No Food Insecurity (12/14/2021)   Hunger Vital Sign    Worried About Running Out of Food in the Last Year: Never true    Ran Out of Food in the Last Year: Never true  Transportation Needs: No Transportation Needs (12/14/2021)   PRAPARE -  Hydrologist (Medical): No    Lack of Transportation (Non-Medical): No  Recent Concern: Transportation Needs - Unmet Transportation Needs (10/22/2021)   PRAPARE - Hydrologist (Medical): Yes    Lack of Transportation (Non-Medical): No  Physical Activity: Inactive (12/14/2021)   Exercise Vital Sign    Days of Exercise per Week: 0 days    Minutes of Exercise per Session: 0 min  Stress: No Stress Concern Present (12/14/2021)   [place]    Feeling of Stress : Only a little  Social Connections: Socially Isolated (12/14/2021)   Social Connection and Isolation Panel [NHANES]    Frequency of Communication with Friends and Family:  More than three times a week    Frequency of Social Gatherings with Friends and Family: More than three times a week    Attends Religious Services: Never    Marine scientist or Organizations: No    Attends Archivist Meetings: Never    Marital Status: Widowed  Intimate Partner Violence: Not At Risk (12/14/2021)   Humiliation, Afraid, Rape, and Kick questionnaire    Fear of Current or Ex-Partner: No    Emotionally Abused: No    Physically Abused: No    Sexually Abused: No     Family History  Problem Relation Age of Onset   Parkinson's disease Other    CVA Other    Parkinson's disease Father     Review of Systems  Constitutional:  Positive for malaise/fatigue.  HENT: Negative.    Gastrointestinal: Negative.   Genitourinary: Negative.   Musculoskeletal:  Positive for joint pain.  Skin: Negative.   Neurological: Negative.   Endo/Heme/Allergies: Negative.   Psychiatric/Behavioral: Negative.        Physical Examination  Vitals:   05/31/22 1950  BP: 129/65  Pulse: (!) 102  Resp: (!) 22  Temp: 97.9 F (36.6 C)  SpO2: 94%   Body mass index is 41.81 kg/m.  Physical Exam Constitutional:      Appearance: She is  obese.  HENT:     Head: Normocephalic.     Nose: Nose normal.  Eyes:     Pupils: Pupils are equal, round, and reactive to light.  Cardiovascular:     Rate and Rhythm: Normal rate.     Pulses:          Femoral pulses are 1+ on the right side and 2+ on the left side.      Popliteal pulses are 0 on the right side and 0 on the left side.       Dorsalis pedis pulses are detected w/ Doppler on the right side and detected w/ Doppler on the left side.  Pulmonary:     Effort: Pulmonary effort is normal.  Musculoskeletal:     Right lower leg: Edema present.     Left lower leg: Edema present.  Skin:    Capillary Refill: Capillary refill takes 2 to 3 seconds.  Neurological:     General: No focal deficit present.     Mental Status: She is alert.  Psychiatric:        Mood and Affect: Mood normal.      CBC    Component Value Date/Time   WBC 10.1 09/10/2021 1529   WBC 9.5 06/12/2021 0746   RBC 4.29 09/10/2021 1529   RBC 4.32 06/12/2021 0746   HGB 13.6 09/10/2021 1529   HCT 41.0 09/10/2021 1529   PLT 203 09/10/2021 1529   MCV 96 09/10/2021 1529   MCH 31.7 09/10/2021 1529   MCH 32.4 06/12/2021 0746   MCHC 33.2 09/10/2021 1529   MCHC 32.5 06/12/2021 0746   RDW 13.9 09/10/2021 1529   LYMPHSABS 1.0 09/10/2021 1529   MONOABS 1.0 06/12/2021 0746   EOSABS 0.1 09/10/2021 1529   BASOSABS 0.1 09/10/2021 1529    BMET    Component Value Date/Time   NA 140 09/10/2021 1529   K 4.5 09/10/2021 1529   CL 103 09/10/2021 1529   CO2 18 (L) 09/10/2021 1529   GLUCOSE 127 (H) 09/10/2021 1529   GLUCOSE 165 (H) 06/13/2021 0534   BUN 8 09/10/2021 1529   CREATININE 0.87 09/10/2021 1529  CALCIUM 9.0 09/10/2021 1529   GFRNONAA >60 06/13/2021 0534   GFRAA 62 02/12/2020 0933    COAGS: Lab Results  Component Value Date   INR 3.1 (A) 08/03/2021   INR 2.2 (H) 06/13/2021   INR 1.7 (H) 06/12/2021     Non-Invasive Vascular Imaging:   CTA IMPRESSION: VASCULAR  1. Positive for acute appearing  occlusion of the distal left popliteal artery beginning at the P3 segment and extending into the trifurcation vessels. The anterior tibial and peroneal arteries remain patent but the posterior tibial artery appears occluded proximally. The PTA does reconstitute via collateral flow. 2. Possible mild to moderate focal stenosis of the right distal external iliac artery due to circumferential calcified atherosclerotic plaque. 3. Mild bilateral renal artery stenoses. 4. Mild celiac artery origin stenosis. 5. Scattered aortic atherosclerotic calcifications without aneurysm or dissection. Aortic Atherosclerosis (ICD10-I70.0).   ASSESSMENT/PLAN: This is a 77 y.o. female presented with left knee pain with concern for acutely occluded ipsilateral popliteal tibial artery.  Patient with signals of both anterior tibial arteries and left foot is sensation and motor intact without any overt evidence of ischemia.  This suggest a more chronic popliteal artery occlusion that is currently asymptomatic and given the patient's nonambulatory status would not recommend any intervention at this time.  If okay with primary would benefit from antiplatelet agent but does not require anticoagulation from vascular standpoint.  I will order ABIs for tomorrow as a baseline.  Jocelyne Reinertsen C. Donzetta Matters, MD [facility] Office: [phone] Pager: [phone]

## 2022-06-01 ENCOUNTER — Observation Stay (HOSPITAL_COMMUNITY): Payer: Medicare Other

## 2022-06-01 ENCOUNTER — Encounter (HOSPITAL_COMMUNITY): Payer: Self-pay | Admitting: Internal Medicine

## 2022-06-01 DIAGNOSIS — I361 Nonrheumatic tricuspid (valve) insufficiency: Secondary | ICD-10-CM | POA: Diagnosis not present

## 2022-06-01 DIAGNOSIS — I69391 Dysphagia following cerebral infarction: Secondary | ICD-10-CM | POA: Diagnosis not present

## 2022-06-01 DIAGNOSIS — S065XAD Traumatic subdural hemorrhage with loss of consciousness status unknown, subsequent encounter: Secondary | ICD-10-CM | POA: Diagnosis present

## 2022-06-01 DIAGNOSIS — Z87891 Personal history of nicotine dependence: Secondary | ICD-10-CM | POA: Diagnosis not present

## 2022-06-01 DIAGNOSIS — G40909 Epilepsy, unspecified, not intractable, without status epilepticus: Secondary | ICD-10-CM | POA: Diagnosis present

## 2022-06-01 DIAGNOSIS — I743 Embolism and thrombosis of arteries of the lower extremities: Secondary | ICD-10-CM | POA: Diagnosis present

## 2022-06-01 DIAGNOSIS — R609 Edema, unspecified: Secondary | ICD-10-CM | POA: Diagnosis not present

## 2022-06-01 DIAGNOSIS — Z87828 Personal history of other (healed) physical injury and trauma: Secondary | ICD-10-CM | POA: Diagnosis not present

## 2022-06-01 DIAGNOSIS — M79606 Pain in leg, unspecified: Secondary | ICD-10-CM | POA: Diagnosis present

## 2022-06-01 DIAGNOSIS — I63511 Cerebral infarction due to unspecified occlusion or stenosis of right middle cerebral artery: Secondary | ICD-10-CM | POA: Diagnosis present

## 2022-06-01 DIAGNOSIS — R296 Repeated falls: Secondary | ICD-10-CM | POA: Diagnosis present

## 2022-06-01 DIAGNOSIS — E782 Mixed hyperlipidemia: Secondary | ICD-10-CM | POA: Diagnosis present

## 2022-06-01 DIAGNOSIS — M25562 Pain in left knee: Secondary | ICD-10-CM | POA: Diagnosis not present

## 2022-06-01 DIAGNOSIS — M1991 Primary osteoarthritis, unspecified site: Secondary | ICD-10-CM | POA: Diagnosis present

## 2022-06-01 DIAGNOSIS — I10 Essential (primary) hypertension: Secondary | ICD-10-CM | POA: Diagnosis present

## 2022-06-01 DIAGNOSIS — Z6841 Body Mass Index (BMI) 40.0 and over, adult: Secondary | ICD-10-CM | POA: Diagnosis not present

## 2022-06-01 DIAGNOSIS — I70222 Atherosclerosis of native arteries of extremities with rest pain, left leg: Secondary | ICD-10-CM | POA: Diagnosis not present

## 2022-06-01 DIAGNOSIS — Z950 Presence of cardiac pacemaker: Secondary | ICD-10-CM | POA: Diagnosis not present

## 2022-06-01 DIAGNOSIS — I495 Sick sinus syndrome: Secondary | ICD-10-CM | POA: Diagnosis present

## 2022-06-01 DIAGNOSIS — I739 Peripheral vascular disease, unspecified: Secondary | ICD-10-CM | POA: Diagnosis present

## 2022-06-01 DIAGNOSIS — J984 Other disorders of lung: Secondary | ICD-10-CM | POA: Diagnosis present

## 2022-06-01 DIAGNOSIS — Z820 Family history of epilepsy and other diseases of the nervous system: Secondary | ICD-10-CM | POA: Diagnosis not present

## 2022-06-01 DIAGNOSIS — J90 Pleural effusion, not elsewhere classified: Secondary | ICD-10-CM | POA: Diagnosis not present

## 2022-06-01 DIAGNOSIS — I110 Hypertensive heart disease with heart failure: Secondary | ICD-10-CM | POA: Diagnosis present

## 2022-06-01 DIAGNOSIS — I700 Atherosclerosis of aorta: Secondary | ICD-10-CM | POA: Diagnosis not present

## 2022-06-01 DIAGNOSIS — I998 Other disorder of circulatory system: Secondary | ICD-10-CM | POA: Diagnosis not present

## 2022-06-01 DIAGNOSIS — K219 Gastro-esophageal reflux disease without esophagitis: Secondary | ICD-10-CM | POA: Diagnosis present

## 2022-06-01 DIAGNOSIS — I63411 Cerebral infarction due to embolism of right middle cerebral artery: Secondary | ICD-10-CM | POA: Diagnosis not present

## 2022-06-01 DIAGNOSIS — R5383 Other fatigue: Secondary | ICD-10-CM | POA: Diagnosis not present

## 2022-06-01 DIAGNOSIS — I509 Heart failure, unspecified: Secondary | ICD-10-CM | POA: Diagnosis present

## 2022-06-01 DIAGNOSIS — I5032 Chronic diastolic (congestive) heart failure: Secondary | ICD-10-CM | POA: Diagnosis present

## 2022-06-01 DIAGNOSIS — J189 Pneumonia, unspecified organism: Secondary | ICD-10-CM | POA: Diagnosis not present

## 2022-06-01 DIAGNOSIS — E039 Hypothyroidism, unspecified: Secondary | ICD-10-CM | POA: Diagnosis present

## 2022-06-01 DIAGNOSIS — Z79899 Other long term (current) drug therapy: Secondary | ICD-10-CM | POA: Diagnosis not present

## 2022-06-01 DIAGNOSIS — R911 Solitary pulmonary nodule: Secondary | ICD-10-CM | POA: Diagnosis not present

## 2022-06-01 DIAGNOSIS — I4821 Permanent atrial fibrillation: Secondary | ICD-10-CM | POA: Diagnosis present

## 2022-06-01 DIAGNOSIS — Z66 Do not resuscitate: Secondary | ICD-10-CM | POA: Diagnosis present

## 2022-06-01 DIAGNOSIS — G4733 Obstructive sleep apnea (adult) (pediatric): Secondary | ICD-10-CM | POA: Diagnosis present

## 2022-06-01 DIAGNOSIS — M6281 Muscle weakness (generalized): Secondary | ICD-10-CM | POA: Diagnosis present

## 2022-06-01 DIAGNOSIS — J449 Chronic obstructive pulmonary disease, unspecified: Secondary | ICD-10-CM | POA: Diagnosis present

## 2022-06-01 DIAGNOSIS — J440 Chronic obstructive pulmonary disease with acute lower respiratory infection: Secondary | ICD-10-CM | POA: Diagnosis not present

## 2022-06-01 DIAGNOSIS — I69319 Unspecified symptoms and signs involving cognitive functions following cerebral infarction: Secondary | ICD-10-CM | POA: Diagnosis present

## 2022-06-01 DIAGNOSIS — I482 Chronic atrial fibrillation, unspecified: Secondary | ICD-10-CM | POA: Diagnosis not present

## 2022-06-01 DIAGNOSIS — I6523 Occlusion and stenosis of bilateral carotid arteries: Secondary | ICD-10-CM | POA: Diagnosis not present

## 2022-06-01 DIAGNOSIS — I63412 Cerebral infarction due to embolism of left middle cerebral artery: Secondary | ICD-10-CM | POA: Diagnosis present

## 2022-06-01 DIAGNOSIS — Z9181 History of falling: Secondary | ICD-10-CM | POA: Diagnosis not present

## 2022-06-01 DIAGNOSIS — R531 Weakness: Secondary | ICD-10-CM | POA: Diagnosis not present

## 2022-06-01 DIAGNOSIS — I70202 Unspecified atherosclerosis of native arteries of extremities, left leg: Secondary | ICD-10-CM | POA: Diagnosis present

## 2022-06-01 DIAGNOSIS — I75022 Atheroembolism of left lower extremity: Secondary | ICD-10-CM | POA: Diagnosis not present

## 2022-06-01 DIAGNOSIS — I639 Cerebral infarction, unspecified: Secondary | ICD-10-CM | POA: Diagnosis not present

## 2022-06-01 DIAGNOSIS — I63542 Cerebral infarction due to unspecified occlusion or stenosis of left cerebellar artery: Secondary | ICD-10-CM | POA: Diagnosis present

## 2022-06-01 DIAGNOSIS — R1311 Dysphagia, oral phase: Secondary | ICD-10-CM | POA: Diagnosis present

## 2022-06-01 DIAGNOSIS — R9082 White matter disease, unspecified: Secondary | ICD-10-CM | POA: Diagnosis not present

## 2022-06-01 DIAGNOSIS — Z823 Family history of stroke: Secondary | ICD-10-CM | POA: Diagnosis not present

## 2022-06-01 DIAGNOSIS — Z7401 Bed confinement status: Secondary | ICD-10-CM | POA: Diagnosis not present

## 2022-06-01 LAB — CBC
HCT: 40.7 % (ref 36.0–46.0)
Hemoglobin: 13.7 g/dL (ref 12.0–15.0)
MCH: 30.9 pg (ref 26.0–34.0)
MCHC: 33.7 g/dL (ref 30.0–36.0)
MCV: 91.9 fL (ref 80.0–100.0)
Platelets: UNDETERMINED 10*3/uL (ref 150–400)
RBC: 4.43 MIL/uL (ref 3.87–5.11)
RDW: 15.5 % (ref 11.5–15.5)
WBC: 15.3 10*3/uL — ABNORMAL HIGH (ref 4.0–10.5)
nRBC: 0 % (ref 0.0–0.2)

## 2022-06-01 LAB — BASIC METABOLIC PANEL
Anion gap: 11 (ref 5–15)
BUN: 7 mg/dL — ABNORMAL LOW (ref 8–23)
CO2: 20 mmol/L — ABNORMAL LOW (ref 22–32)
Calcium: 8.4 mg/dL — ABNORMAL LOW (ref 8.9–10.3)
Chloride: 100 mmol/L (ref 98–111)
Creatinine, Ser: 0.83 mg/dL (ref 0.44–1.00)
GFR, Estimated: >60 mL/min (ref >60)
Glucose, Bld: 96 mg/dL (ref 70–99)
Potassium: 4.4 mmol/L (ref 3.5–5.1)
Sodium: 131 mmol/L — ABNORMAL LOW (ref 135–145)

## 2022-06-01 LAB — BRAIN NATRIURETIC PEPTIDE: B Natriuretic Peptide: 221.3 pg/mL — ABNORMAL HIGH (ref 0.0–100.0)

## 2022-06-01 MED ORDER — SENNOSIDES-DOCUSATE SODIUM 8.6-50 MG PO TABS
1.0000 | ORAL_TABLET | Freq: Two times a day (BID) | ORAL | Status: DC
  Administered 2022-06-01 – 2022-06-05 (×9): 1 via ORAL
  Filled 2022-06-01 (×9): qty 1

## 2022-06-01 MED ORDER — HYDROMORPHONE HCL 1 MG/ML IJ SOLN
1.0000 mg | INTRAMUSCULAR | Status: DC | PRN
  Administered 2022-06-01 – 2022-06-02 (×2): 1 mg via INTRAVENOUS
  Filled 2022-06-01 (×2): qty 1

## 2022-06-01 MED ORDER — ATORVASTATIN CALCIUM 40 MG PO TABS
40.0000 mg | ORAL_TABLET | Freq: Every day | ORAL | Status: DC
  Administered 2022-06-01 – 2022-06-03 (×3): 40 mg via ORAL
  Filled 2022-06-01 (×4): qty 1

## 2022-06-01 MED ORDER — DILTIAZEM HCL ER COATED BEADS 240 MG PO CP24
240.0000 mg | ORAL_CAPSULE | Freq: Every day | ORAL | Status: DC
  Administered 2022-06-01 – 2022-06-05 (×5): 240 mg via ORAL
  Filled 2022-06-01 (×5): qty 1

## 2022-06-01 MED ORDER — LEVOTHYROXINE SODIUM 75 MCG PO TABS
150.0000 ug | ORAL_TABLET | Freq: Every day | ORAL | Status: DC
  Administered 2022-06-01 – 2022-06-05 (×5): 150 ug via ORAL
  Filled 2022-06-01 (×6): qty 2

## 2022-06-01 MED ORDER — PANTOPRAZOLE SODIUM 40 MG PO TBEC
40.0000 mg | DELAYED_RELEASE_TABLET | Freq: Every day | ORAL | Status: DC
  Administered 2022-06-01 – 2022-06-05 (×4): 40 mg via ORAL
  Filled 2022-06-01 (×4): qty 1

## 2022-06-01 MED ORDER — SODIUM CHLORIDE 0.9 % IV SOLN: INTRAVENOUS | Status: AC

## 2022-06-01 MED ORDER — METOPROLOL TARTRATE 100 MG PO TABS
100.0000 mg | ORAL_TABLET | Freq: Two times a day (BID) | ORAL | Status: DC
  Administered 2022-06-01 – 2022-06-05 (×8): 100 mg via ORAL
  Filled 2022-06-01 (×9): qty 1

## 2022-06-01 MED ORDER — LEVETIRACETAM 750 MG PO TABS
750.0000 mg | ORAL_TABLET | Freq: Two times a day (BID) | ORAL | Status: DC
  Administered 2022-06-01 – 2022-06-05 (×9): 750 mg via ORAL
  Filled 2022-06-01 (×10): qty 1

## 2022-06-01 MED ORDER — CLOPIDOGREL BISULFATE 75 MG PO TABS
75.0000 mg | ORAL_TABLET | Freq: Every day | ORAL | Status: DC
  Administered 2022-06-01 – 2022-06-05 (×5): 75 mg via ORAL
  Filled 2022-06-01 (×5): qty 1

## 2022-06-01 MED ORDER — HYDROCODONE-ACETAMINOPHEN 5-325 MG PO TABS
1.0000 | ORAL_TABLET | Freq: Four times a day (QID) | ORAL | Status: DC | PRN
  Administered 2022-06-01 – 2022-06-04 (×3): 1 via ORAL
  Filled 2022-06-01 (×4): qty 1

## 2022-06-01 NOTE — Progress Notes (Signed)
ABI w/ TBI study completed.   Please see CV Proc for preliminary results.   Nicandro Perrault, RDMS, RVT  

## 2022-06-01 NOTE — Plan of Care (Signed)
  Problem: Coping: Goal: Level of anxiety will decrease Outcome: Progressing   Problem: Pain Managment: Goal: General experience of comfort will improve Outcome: Progressing   Problem: Safety: Goal: Ability to remain free from injury will improve Outcome: Progressing   

## 2022-06-01 NOTE — Care Management Obs Status (Signed)
[place] NOTIFICATION   Patient Details  Name: SHERLIE TRAWINSKI MRN: [id number] Date of Birth: January 07, 1946   Medicare Observation Status Notification Given:  Yes    Zenon Mayo, RN 06/01/2022, 3:15 PM

## 2022-06-01 NOTE — Progress Notes (Signed)
PROGRESS NOTE    TRACYANN WASSMUTH  [id number] DOB: October 17, 1945 DOA: 05/31/2022 PCP: Sharion Balloon, FNP  Obese chronically ill female 77/F with history of second-degree AV block with PPM, permanent A-fib not on anticoagulation following history of fall and SDH, hypertension, chronic diastolic CHF, COPD, anxiety, lymphedema, OSA, hypothyroidism presented to [facility] ED with left knee/leg pain.  Afebrile, vital signs stable, CT a of abdominal aorta with [name] noted acute appearing occlusion in the distal left popliteal artery extending to trifurcation vessels, posterior tibial appeared occluded posteriorly   Subjective: -Has some knee pain, denies any other symptoms at this time  Assessment and Plan:  Left knee pain -Suspect this is secondary to osteoarthritis and unrelated to distal PAD -Check x-rays, PT eval, Vicodin as needed  Peripheral vascular disease, occlusion of the distal left popliteal artery -CTA abdominal aorta and bilateral iliofemoral runoff W Wo contrast showing acute appearing occlusion of the distal left popliteal artery , posterior tibial artery appears occluded proximally. The PTA does reconstitute via collateral flow.  -Vascular surgery consulting, occlusion felt to be chronic -Follow-up duplex ultrasound, start Plavix -PT eval, very poor functional status at baseline mostly nonambulatory, stays in recliner all day and night   Mild leukocytosis Likely reactive, afebrile and nontoxic, will monitor   History of symptomatic second-degree AV block status post PPM -Due for pacemaker battery replacement, will send message to EP   Permanent A-fib Not on anticoagulation due to history of fall and SDH.  -Restart metoprolol and Cardizem   Chronic HFpEF CT showing small right pleural effusion.   -Add low-dose p.o. Lasix  COPD: Stable, no signs of acute exacerbation.  Hypothyroidism -Continue Synthroid  Seizure disorder -Restart Keppra   DVT  prophylaxis:SCDs Code Status: DNR Family Communication: None present Disposition Plan: May need SNF  Consultants: Vascular surgery   Procedures:   Antimicrobials:    Objective: Vitals:   05/31/22 1950 06/01/22 0023 06/01/22 0414 06/01/22 0717  BP: 129/65 (!) 143/65 (!) 151/77 (!) 120/56  Pulse: (!) 102 96 (!) 103 80  Resp: '18 20 19 19  '$ Temp: 97.9 F (36.6 C) 98 F (36.7 C) 98 F (36.7 C) 98 F (36.7 C)  TempSrc: Oral Oral Oral Oral  SpO2: 94% 90% 92% 90%  Weight: 97.1 kg 97.5 kg      Intake/Output Summary (Last 24 hours) at 06/01/2022 0941 Last data filed at 06/01/2022 0800 Gross per 24 hour  Intake 118 ml  Output 300 ml  Net -182 ml   Filed Weights   05/31/22 1950 06/01/22 0023  Weight: 97.1 kg 97.5 kg    Examination:  General exam: Obese chronically ill female sitting up in bed, AAOx3, no distress HEENT: No JVD CVS: S1-S2, regular rhythm Lungs: Clear bilaterally Abdomen: Soft, nontender, bowel sounds present Extremities: Dry scaly lower extremity, unable to palpate light dorsalis pedis pulses on both legs, delayed capillary refill Psychiatry:  Mood & affect appropriate.     Data Reviewed:   CBC: Recent Labs  Lab 06/01/22 0043  WBC 15.3*  HGB 13.7  HCT 40.7  MCV 91.9  PLT PLATELET CLUMPS NOTED ON SMEAR, UNABLE TO ESTIMATE   Basic Metabolic Panel: Recent Labs  Lab 06/01/22 0043  NA 131*  K 4.4  CL 100  CO2 20*  GLUCOSE 96  BUN 7*  CREATININE 0.83  CALCIUM 8.4*   GFR: Estimated Creatinine Clearance: 58.5 mL/min (by C-G formula based on SCr of 0.83 mg/dL). Liver Function Tests: No results for input(s): "  AST", "ALT", "ALKPHOS", "BILITOT", "PROT", "ALBUMIN" in the last 168 hours. No results for input(s): "LIPASE", "AMYLASE" in the last 168 hours. No results for input(s): "AMMONIA" in the last 168 hours. Coagulation Profile: No results for input(s): "INR", "PROTIME" in the last 168 hours. Cardiac Enzymes: No results for input(s):  "CKTOTAL", "CKMB", "CKMBINDEX", "TROPONINI" in the last 168 hours. BNP (last 3 results) No results for input(s): "PROBNP" in the last 8760 hours. HbA1C: No results for input(s): "HGBA1C" in the last 72 hours. CBG: No results for input(s): "GLUCAP" in the last 168 hours. Lipid Profile: No results for input(s): "CHOL", "HDL", "LDLCALC", "TRIG", "CHOLHDL", "LDLDIRECT" in the last 72 hours. Thyroid Function Tests: No results for input(s): "TSH", "T4TOTAL", "FREET4", "T3FREE", "THYROIDAB" in the last 72 hours. Anemia Panel: No results for input(s): "VITAMINB12", "FOLATE", "FERRITIN", "TIBC", "IRON", "RETICCTPCT" in the last 72 hours. Urine analysis:    Component Value Date/Time   COLORURINE YELLOW 05/17/2021 1822   APPEARANCEUR CLOUDY (A) 05/17/2021 1822   APPEARANCEUR Cloudy (A) 04/02/2021 1448   LABSPEC 1.025 05/17/2021 1822   PHURINE 5.0 05/17/2021 1822   GLUCOSEU NEGATIVE 05/17/2021 1822   HGBUR SMALL (A) 05/17/2021 1822   BILIRUBINUR NEGATIVE 05/17/2021 1822   BILIRUBINUR Negative 04/02/2021 1448   KETONESUR 5 (A) 05/17/2021 1822   PROTEINUR 30 (A) 05/17/2021 1822   UROBILINOGEN 0.2 02/01/2014 1811   NITRITE NEGATIVE 05/17/2021 1822   LEUKOCYTESUR MODERATE (A) 05/17/2021 1822   Sepsis Labs: '@LABRCNTIP'$ (procalcitonin:4,lacticidven:4)  )No results found for this or any previous visit (from the past 240 hour(s)).   Radiology Studies: VAS [place] ABI WITH/WO TBI  Result Date: 06/01/2022  LOWER EXTREMITY DOPPLER STUDY Patient Name:  LAKKEN CIPOLLA  Date of Exam:   06/01/2022 Medical Rec #: [id number]         Accession #:    [id number] Date of Birth: Sep 14, 1945         Patient Gender: F Patient Age:   77 years Exam Location:  [facility] Procedure:      VAS [place] ABI WITH/WO TBI Referring Phys: Servando Snare --------------------------------------------------------------------------------  Indications: Peripheral artery disease. High Risk Factors: Hypertension, hyperlipidemia, past history  of smoking.  Limitations: Today's examination was extremely limited secondary to patient pain              with cuff inflation. Comparison Study: 05-31-2022 CTA abdominal aorta w/ lower extremity at outside                   facility. acute appearing occlusion of the distal left                   popliteal artery beginning at the P3 segment and extending                   into the trifurcation vessels. The anterior tibial and                   peroneal arteries remain patent but the posterior tibial                   artery appears occluded proximally. The PTA does reconstitute                   via collateral flow. Performing Technologist: Darlin Coco RDMS RVT  Examination Guidelines: A complete evaluation includes at minimum, Doppler waveform signals and systolic blood pressure reading at the level of bilateral brachial, anterior tibial, and posterior tibial arteries, when vessel  segments are accessible. Bilateral testing is considered an integral part of a complete examination. Photoelectric Plethysmograph (PPG) waveforms and toe systolic pressure readings are included as required and additional duplex testing as needed. Limited examinations for reoccurring indications may be performed as noted.  ABI Findings: +---------+------------------+-----+-----------+-------------------------------+ Right    Rt Pressure (mmHg)IndexWaveform   Comment                         +---------+------------------+-----+-----------+-------------------------------+ Brachial 148                    triphasic                                  +---------+------------------+-----+-----------+-------------------------------+ PTA                             multiphasicUnable to obtain pressure                                                  secondary to patient pain       +---------+------------------+-----+-----------+-------------------------------+ DP       255               1.70 multiphasicSuboptimal  secondary to patient                                            pain                            +---------+------------------+-----+-----------+-------------------------------+ Great Toe34                0.23 Abnormal                                   +---------+------------------+-----+-----------+-------------------------------+ +---------+------------------+-----+----------+--------------------------------+ Left     Lt Pressure (mmHg)IndexWaveform  Comment                          +---------+------------------+-----+----------+--------------------------------+ Brachial 150                                                               +---------+------------------+-----+----------+--------------------------------+ PTA                             monophasicUnable to obtain pressure                                                  secondary to patient pain        +---------+------------------+-----+----------+--------------------------------+ DP       92  0.61 monophasicSuboptimal secondary to patient                                            pain                             +---------+------------------+-----+----------+--------------------------------+ Great Toe0                 0.00 Absent                                     +---------+------------------+-----+----------+--------------------------------+  Arterial wall calcification precludes accurate ankle pressures and ABIs.  Summary: Right: Resting right ankle-brachial index indicates noncompressible right lower extremity arteries. The right toe-brachial index is abnormal. Left: Resting left ankle-brachial index indicates moderate left lower extremity arterial disease; however, arterial calcification may preclude accurate pressures. The left toe-brachial index is absent. *See table(s) above for measurements and observations.     Preliminary      Scheduled Meds:  atorvastatin  40 mg Oral  Daily   clopidogrel  75 mg Oral Daily   diltiazem  240 mg Oral Daily   levETIRAcetam  750 mg Oral BID   levothyroxine  150 mcg Oral Q0600   metoprolol tartrate  100 mg Oral BID   pantoprazole  40 mg Oral Q1200   Continuous Infusions:   LOS: 1 day    Time spent: 15mn    PDomenic Polite MD [facility]   06/01/2022, 9:41 AM

## 2022-06-01 NOTE — TOC Progression Note (Signed)
Transition of Care [facility]) - Progression Note    Patient Details  Name: Megan [name] MRN: [id number] Date of Birth: 1946-01-30  Transition of Care [facility]) CM/SW Contact  Zenon Mayo, RN Phone Number: 06/01/2022, 1:45 PM  Clinical Narrative:    from home with son, occluded L fem pop, for vascular u/s. Pt is rec SNF.        Expected Discharge Plan and Services                                               Social Determinants of Health (SDOH) Interventions SDOH Screenings   Food Insecurity: No Food Insecurity (06/01/2022)  Housing: Low Risk  (06/01/2022)  Transportation Needs: No Transportation Needs (06/01/2022)  Utilities: Not At Risk (06/01/2022)  Alcohol Screen: Low Risk  (12/14/2021)  Depression (PHQ2-9): Low Risk  (12/14/2021)  Recent Concern: Depression (PHQ2-9) - Medium Risk (10/22/2021)  Financial Resource Strain: Low Risk  (12/14/2021)  Physical Activity: Inactive (12/14/2021)  Social Connections: Socially Isolated (12/14/2021)  Stress: No Stress Concern Present (12/14/2021)  Tobacco Use: Medium Risk (06/01/2022)    Readmission Risk Interventions     No data to display

## 2022-06-01 NOTE — Evaluation (Signed)
Physical Therapy Evaluation Patient Details Name: Megan [name] MRN: [id number] DOB: [birth year] Today's Date: 06/01/2022  History of Present Illness  Megan [name] is a 77 y.o. female presented to [facility] ED on 3/4 with left leg pain. CTA revealed postive fro acute appearing occlusion of distal L popliteal artery. Pt transferred to [facility] PMH: symptomatic second-degree AV block status post PPM, permanent A-fib not on anticoagulation due to history of fall and SDH, class III obesity (BMI 41.81), hypertension, chronic HFpEF, anxiety, COPD, GERD, hyperlipidemia, lymphedema, OSA, hypothyroidism, PVD  Clinical Impression  Pt admitted with above. Pt lives in [place] and only transfers from recliner to [facility] at home. Pt unable to bath self, complete pericare or hygiene s/p tolieting, depends on son to bring her food, and relies on ambulance services to take her to [place] appointments. Pt is unsafe to be home alone and is unable to care for herself and it appears her son is unable to care for her as well. Pt modA for transfer from bed to chair, limited to 3 steps to chair. Pt to benefit from SNF upon d/c to address above deficits to achieve safe level of function to return home.   Acute PT to cont to follow.     Recommendations for follow up therapy are one component of a multi-disciplinary discharge planning process, led by the attending physician.  Recommendations may be updated based on patient status, additional functional criteria and insurance authorization.  Follow Up Recommendations Skilled nursing-short term rehab (<3 hours/day) Can patient physically be transported by private vehicle: No    Assistance Recommended at Discharge Frequent or constant Supervision/Assistance  Patient can return home with the following  A lot of help with walking and/or transfers;A lot of help with bathing/dressing/bathroom;Assist for transportation;Help with stairs or ramp for entrance;Two people to help with  bathing/dressing/bathroom;Assistance with cooking/housework    Equipment Recommendations None recommended by PT (has DME at home)  Recommendations for Other Services       Functional Status Assessment Patient has had a recent decline in their functional status and demonstrates the ability to make significant improvements in function in a reasonable and predictable amount of time.     Precautions / Restrictions Precautions Precautions: Fall Precaution Comments: morbidly obese Restrictions Weight Bearing Restrictions: No      Mobility  Bed Mobility Overal bed mobility: Needs Assistance Bed Mobility: Supine to Sit     Supine to sit: Min assist     General bed mobility comments: HOB elevated, minA for L LE management, used bed rail to bring self over to EOB    Transfers Overall transfer level: Needs assistance Equipment used: Rolling walker (2 wheels) Transfers: Sit to/from Stand, Bed to chair/wheelchair/BSC Sit to Stand: Mod assist   Step pivot transfers: Min assist       General transfer comment: modA to power up to [place] for walker management during turn to chair, pt reached for arm rest letting go of walker stating "this is what I do. I need a hand on the chair" despite RW tipping requiring PT to keep RW on ground    Ambulation/Gait               General Gait Details: limited to step pvt to chair  Stairs            Wheelchair Mobility    Modified Rankin (Stroke Patients Only)       Balance Overall balance assessment: Needs assistance Sitting-balance support: Feet supported, No  upper extremity supported Sitting balance-Leahy Scale: Fair     Standing balance support: Reliant on assistive device for balance, Bilateral upper extremity supported Standing balance-Leahy Scale: Poor Standing balance comment: dependent on RW                             Pertinent Vitals/Pain Pain Assessment Pain Assessment: Faces Faces Pain Scale:  Hurts even more Pain Location: L LE to touch Pain Descriptors / Indicators: Burning Pain Intervention(s): Monitored during session    Home Living Family/patient expects to be discharged to:: Private residence Living Arrangements: Children Available Help at Discharge: Family;Available PRN/intermittently Type of Home: House Home Access: Stairs to enter Entrance Stairs-Rails: Right Entrance Stairs-Number of Steps: 5   Home Layout: One level Home Equipment: Conservation officer, nature (2 wheels) Additional Comments: pt stays in recliner day and night,    Prior Function Prior Level of Function : Needs assist             Mobility Comments: pt reports only completing step pvt transfers to/from recliner/BSC. Pt sleeps in recliner and stays in recliner all day ADLs Comments: pt unable to perform hygiene s/p tolieting and reports rubbing her bottom on towel when she sits down on the chair stating "I do what I can". Pt reports son brings her food that will last all day. She goes to MD and hospital by ambulance     Hand Dominance   Dominant Hand: Right    Extremity/Trunk Assessment   Upper Extremity Assessment Upper Extremity Assessment: Generalized weakness    Lower Extremity Assessment Lower Extremity Assessment: Generalized weakness    Cervical / Trunk Assessment Cervical / Trunk Assessment: Normal  Communication   Communication: No difficulties  Cognition Arousal/Alertness: Awake/alert Behavior During Therapy: WFL for tasks assessed/performed Overall Cognitive Status: No family/caregiver present to determine baseline cognitive functioning                                 General Comments: pt with decreased insight to safety, doesn't appear to recognize she can't care for herself        General Comments General comments (skin integrity, edema, etc.): pt with poor skin quality t/o body, edema t/o LEs, pt in afib with HR going from 90s-140s t/o session    Exercises      Assessment/Plan    PT Assessment Patient needs continued PT services  PT Problem List Decreased strength;Decreased range of motion;Decreased activity tolerance;Decreased balance;Decreased mobility;Decreased coordination;Decreased cognition;Decreased knowledge of use of DME;Decreased safety awareness       PT Treatment Interventions DME instruction;Gait training;Stair training;Functional mobility training;Therapeutic activities;Therapeutic exercise;Balance training;Neuromuscular re-education    PT Goals (Current goals can be found in the Care Plan section)  Acute Rehab PT Goals Patient Stated Goal: walk again PT Goal Formulation: With patient Time For Goal Achievement: 06/15/22 Potential to Achieve Goals: Fair    Frequency Min 2X/week     Co-evaluation               AM-PAC PT "6 Clicks" Mobility  Outcome Measure Help needed turning from your back to your side while in a flat bed without using bedrails?: A Little Help needed moving from lying on your back to sitting on the side of a flat bed without using bedrails?: A Little Help needed moving to and from a bed to a chair (including a wheelchair)?: A Lot Help needed  standing up from a chair using your arms (e.g., wheelchair or bedside chair)?: A Lot Help needed to walk in hospital room?: Total Help needed climbing 3-5 steps with a railing? : Total 6 Click Score: 12    End of Session Equipment Utilized During Treatment: Gait belt Activity Tolerance: Patient tolerated treatment well Patient left: in chair;with call bell/phone within reach;with chair alarm set;with nursing/sitter in room Nurse Communication: Mobility status PT Visit Diagnosis: Unsteadiness on feet (R26.81);Muscle weakness (generalized) (M62.81);Difficulty in walking, not elsewhere classified (R26.2)    Time: [id number] PT Time Calculation (min) (ACUTE ONLY): 29 min   Charges:   PT Evaluation $PT Eval Moderate Complexity: 1 Mod PT  Treatments $Therapeutic Activity: 8-22 mins        Kittie Plater, PT, DPT Acute Rehabilitation Services Secure chat preferred Office #: [phone]   Berline Lopes 06/01/2022, 1:38 PM

## 2022-06-01 NOTE — Progress Notes (Addendum)
  Progress Note    06/01/2022 12:46 PM * No surgery found *  Subjective: Still having left knee and calf pain  Vitals:   06/01/22 0717 06/01/22 1110  BP: (!) 120/56 (!) 106/53  Pulse: 80 99  Resp: 19 (!) 21  Temp: 98 F (36.7 C) 98 F (36.7 C)  SpO2: 90% 90%    Physical Exam: Awake alert and oriented Strong right dorsalis pedis signal and weak monophasic left dorsalis pedis signal  CBC    Component Value Date/Time   WBC 15.3 (H) 06/01/2022 0043   RBC 4.43 06/01/2022 0043   HGB 13.7 06/01/2022 0043   HGB 13.6 09/10/2021 1529   HCT 40.7 06/01/2022 0043   HCT 41.0 09/10/2021 1529   PLT PLATELET CLUMPS NOTED ON SMEAR, UNABLE TO ESTIMATE 06/01/2022 0043   PLT 203 09/10/2021 1529   MCV 91.9 06/01/2022 0043   MCV 96 09/10/2021 1529   MCH 30.9 06/01/2022 0043   MCHC 33.7 06/01/2022 0043   RDW 15.5 06/01/2022 0043   RDW 13.9 09/10/2021 1529   LYMPHSABS 1.0 09/10/2021 1529   MONOABS 1.0 06/12/2021 0746   EOSABS 0.1 09/10/2021 1529   BASOSABS 0.1 09/10/2021 1529    BMET    Component Value Date/Time   NA 131 (L) 06/01/2022 0043   NA 140 09/10/2021 1529   K 4.4 06/01/2022 0043   CL 100 06/01/2022 0043   CO2 20 (L) 06/01/2022 0043   GLUCOSE 96 06/01/2022 0043   BUN 7 (L) 06/01/2022 0043   BUN 8 09/10/2021 1529   CREATININE 0.83 06/01/2022 0043   CALCIUM 8.4 (L) 06/01/2022 0043   GFRNONAA >60 06/01/2022 0043   GFRAA 62 02/12/2020 0933    INR    Component Value Date/Time   INR 3.1 (A) 08/03/2021 1449   INR 2.2 (H) 06/13/2021 0534   INR 2.7 05/22/2010 1333     Intake/Output Summary (Last 24 hours) at 06/01/2022 1246 Last data filed at 06/01/2022 0800 Gross per 24 hour  Intake 118 ml  Output 300 ml  Net -182 ml     Assessment:  77 y.o. female is here with left lower extremity pain.  On exam last night the pain had resolved but now having left knee pain radiating into the calf again.  I have reviewed the CT scan again and there is stranding around the  popliteal artery and certainly there is some concern that this is acute without any known injury to the knee.  Plan: Angiogram tomorrow with Dr. Virl Cagey in the Cath Lab.  With known atrial fibrillation certainly high risk for having embolus to the left lower extremity and would need to discuss anticoagulation if we find acute thrombus tomorrow in the Cath Lab.  Okay to continue Plavix.  N.p.o. past breakfast tomorrow.   Darrin Apodaca C. Donzetta Matters, MD [facility] Office: [phone] Pager: [phone]  06/01/2022 12:46 PM

## 2022-06-02 ENCOUNTER — Inpatient Hospital Stay (HOSPITAL_COMMUNITY)
Admission: RE | Disposition: A | Discharge status: Skilled Nursing Facility | Payer: Self-pay | Source: Home / Self Care | Attending: Family Medicine

## 2022-06-02 ENCOUNTER — Inpatient Hospital Stay (HOSPITAL_COMMUNITY): Payer: Medicare Other

## 2022-06-02 ENCOUNTER — Encounter (HOSPITAL_COMMUNITY): Payer: Self-pay | Admitting: Vascular Surgery

## 2022-06-02 DIAGNOSIS — I70222 Atherosclerosis of native arteries of extremities with rest pain, left leg: Secondary | ICD-10-CM

## 2022-06-02 DIAGNOSIS — I75022 Atheroembolism of left lower extremity: Secondary | ICD-10-CM

## 2022-06-02 DIAGNOSIS — I739 Peripheral vascular disease, unspecified: Secondary | ICD-10-CM | POA: Diagnosis not present

## 2022-06-02 HISTORY — PX: ABDOMINAL AORTOGRAM W/LOWER EXTREMITY: CATH118223

## 2022-06-02 HISTORY — PX: PERIPHERAL VASCULAR THROMBECTOMY: CATH118306

## 2022-06-02 LAB — BASIC METABOLIC PANEL
Anion gap: 14 (ref 5–15)
BUN: 9 mg/dL (ref 8–23)
CO2: 18 mmol/L — ABNORMAL LOW (ref 22–32)
Calcium: 8.7 mg/dL — ABNORMAL LOW (ref 8.9–10.3)
Chloride: 102 mmol/L (ref 98–111)
Creatinine, Ser: 0.94 mg/dL (ref 0.44–1.00)
GFR, Estimated: >60 mL/min (ref >60)
Glucose, Bld: 91 mg/dL (ref 70–99)
Potassium: 4.5 mmol/L (ref 3.5–5.1)
Sodium: 134 mmol/L — ABNORMAL LOW (ref 135–145)

## 2022-06-02 LAB — CBC
HCT: 40.7 % (ref 36.0–46.0)
Hemoglobin: 13.1 g/dL (ref 12.0–15.0)
MCH: 30.4 pg (ref 26.0–34.0)
MCHC: 32.2 g/dL (ref 30.0–36.0)
MCV: 94.4 fL (ref 80.0–100.0)
Platelets: 217 10*3/uL (ref 150–400)
RBC: 4.31 MIL/uL (ref 3.87–5.11)
RDW: 15.4 % (ref 11.5–15.5)
WBC: 11.6 10*3/uL — ABNORMAL HIGH (ref 4.0–10.5)
nRBC: 0 % (ref 0.0–0.2)

## 2022-06-02 LAB — POCT I-STAT, CHEM 8
BUN: 9 mg/dL (ref 8–23)
Calcium, Ion: 1.15 mmol/L (ref 1.15–1.40)
Chloride: 90 mmol/L — ABNORMAL LOW (ref 98–111)
Creatinine, Ser: 0.5 mg/dL (ref 0.44–1.00)
Glucose, Bld: 98 mg/dL (ref 70–99)
HCT: 38 % (ref 36.0–46.0)
Hemoglobin: 12.9 g/dL (ref 12.0–15.0)
Potassium: 3.8 mmol/L (ref 3.5–5.1)
Sodium: 123 mmol/L — ABNORMAL LOW (ref 135–145)
TCO2: 23 mmol/L (ref 22–32)

## 2022-06-02 LAB — GLUCOSE, CAPILLARY: Glucose-Capillary: 112 mg/dL — ABNORMAL HIGH (ref 70–99)

## 2022-06-02 SURGERY — ABDOMINAL AORTOGRAM W/LOWER EXTREMITY
Anesthesia: LOCAL

## 2022-06-02 MED ORDER — LIDOCAINE HCL (PF) 1 % IJ SOLN
INTRAMUSCULAR | Status: DC | PRN
  Administered 2022-06-02: 15 mL

## 2022-06-02 MED ORDER — ONDANSETRON HCL 4 MG/2ML IJ SOLN: 4.0000 mg | Freq: Four times a day (QID) | INTRAMUSCULAR | Status: DC | PRN

## 2022-06-02 MED ORDER — MIDAZOLAM HCL 2 MG/2ML IJ SOLN
INTRAMUSCULAR | Status: DC | PRN
  Administered 2022-06-02: 1 mg via INTRAVENOUS

## 2022-06-02 MED ORDER — ACETAMINOPHEN 325 MG PO TABS
650.0000 mg | ORAL_TABLET | ORAL | Status: DC | PRN
  Administered 2022-06-04: 650 mg via ORAL

## 2022-06-02 MED ORDER — FENTANYL CITRATE (PF) 100 MCG/2ML IJ SOLN
INTRAMUSCULAR | Status: DC | PRN
  Administered 2022-06-02: 50 ug via INTRAVENOUS

## 2022-06-02 MED ORDER — FENTANYL CITRATE (PF) 100 MCG/2ML IJ SOLN
INTRAMUSCULAR | Status: AC
  Filled 2022-06-02: qty 2

## 2022-06-02 MED ORDER — HYDRALAZINE HCL 20 MG/ML IJ SOLN: 5.0000 mg | INTRAMUSCULAR | Status: DC | PRN

## 2022-06-02 MED ORDER — SODIUM CHLORIDE 0.9 % IV SOLN: 250.0000 mL | INTRAVENOUS | Status: DC | PRN

## 2022-06-02 MED ORDER — SODIUM CHLORIDE 0.9 % IV SOLN
INTRAVENOUS | Status: AC | PRN
  Administered 2022-06-02: 10 mL/h via INTRAVENOUS

## 2022-06-02 MED ORDER — MIDAZOLAM HCL 5 MG/5ML IJ SOLN
INTRAMUSCULAR | Status: AC
  Filled 2022-06-02: qty 5

## 2022-06-02 MED ORDER — LABETALOL HCL 5 MG/ML IV SOLN: 10.0000 mg | INTRAVENOUS | Status: DC | PRN

## 2022-06-02 MED ORDER — SODIUM CHLORIDE 0.9% FLUSH
3.0000 mL | Freq: Two times a day (BID) | INTRAVENOUS | Status: DC
  Administered 2022-06-03 – 2022-06-05 (×5): 3 mL via INTRAVENOUS

## 2022-06-02 MED ORDER — SODIUM CHLORIDE 0.9 % WEIGHT BASED INFUSION
1.0000 mL/kg/h | INTRAVENOUS | Status: AC
  Administered 2022-06-02: 1 mL/kg/h via INTRAVENOUS

## 2022-06-02 MED ORDER — SODIUM CHLORIDE 0.9% FLUSH: 3.0000 mL | INTRAVENOUS | Status: DC | PRN

## 2022-06-02 MED ORDER — HEPARIN (PORCINE) IN NACL 1000-0.9 UT/500ML-% IV SOLN
INTRAVENOUS | Status: DC | PRN
  Administered 2022-06-02 (×2): 500 mL

## 2022-06-02 MED ORDER — HEPARIN SODIUM (PORCINE) 1000 UNIT/ML IJ SOLN
INTRAMUSCULAR | Status: AC
  Filled 2022-06-02: qty 10

## 2022-06-02 MED ORDER — HEPARIN (PORCINE) 25000 UT/250ML-% IV SOLN
1000.0000 [IU]/h | INTRAVENOUS | Status: DC
  Administered 2022-06-02 – 2022-06-03 (×2): 1050 [IU]/h via INTRAVENOUS
  Filled 2022-06-02 (×3): qty 250

## 2022-06-02 MED ORDER — HEPARIN SODIUM (PORCINE) 1000 UNIT/ML IJ SOLN
INTRAMUSCULAR | Status: DC | PRN
  Administered 2022-06-02: 3000 [IU] via INTRAVENOUS
  Administered 2022-06-02: 7000 [IU] via INTRAVENOUS

## 2022-06-02 MED ORDER — LIDOCAINE HCL (PF) 1 % IJ SOLN
INTRAMUSCULAR | Status: AC
  Filled 2022-06-02: qty 30

## 2022-06-02 MED ORDER — IODIXANOL 320 MG/ML IV SOLN
INTRAVENOUS | Status: DC | PRN
  Administered 2022-06-02: 130 mL via INTRA_ARTERIAL

## 2022-06-02 SURGICAL SUPPLY — 24 items
BALLN COYOTE ES OTW 2X20X142 (BALLOONS) ×2
BALLOON COYOTE ES OTW 2X20X142 (BALLOONS) IMPLANT
CANISTER PENUMBRA ENGINE (MISCELLANEOUS) IMPLANT
CATH LIGHTNING 7 XTORQ 130 (CATHETERS) IMPLANT
CATH OMNI FLUSH 5F 65CM (CATHETERS) IMPLANT
CATH QUICKCROSS .035X135CM (MICROCATHETER) IMPLANT
CLOSURE PERCLOSE PROSTYLE (VASCULAR PRODUCTS) IMPLANT
GLIDEWIRE ADV .035X260CM (WIRE) IMPLANT
KIT ENCORE 26 ADVANTAGE (KITS) IMPLANT
KIT MICROPUNCTURE NIT STIFF (SHEATH) IMPLANT
KIT PV (KITS) ×2 IMPLANT
SHEATH CATAPULT 7FR 60 (SHEATH) IMPLANT
SHEATH PINNACLE 5F 10CM (SHEATH) IMPLANT
SHEATH PINNACLE 7F 10CM (SHEATH) IMPLANT
SHEATH PROBE COVER 6X72 (BAG) IMPLANT
STOPCOCK MORSE 400PSI 3WAY (MISCELLANEOUS) IMPLANT
SYR MEDRAD MARK 7 150ML (SYRINGE) ×2 IMPLANT
TRANSDUCER W/STOPCOCK (MISCELLANEOUS) ×2 IMPLANT
TRAY PV CATH (CUSTOM PROCEDURE TRAY) ×2 IMPLANT
TUBING CIL FLEX 10 FLL-RA (TUBING) IMPLANT
WIRE BENTSON .035X145CM (WIRE) IMPLANT
WIRE G VAS 035X260 STIFF (WIRE) IMPLANT
WIRE SHEPHERD 12G .014 (WIRE) IMPLANT
WIRE SPARTACORE .014X300CM (WIRE) IMPLANT

## 2022-06-02 NOTE — NC FL2 (Signed)
[place] LEVEL OF CARE FORM     IDENTIFICATION  Patient Name: Megan [name] Birthdate: 06-17-1945 Sex: female Admission Date (Current Location): 05/31/2022  [facility] and [place] Number:  Herbalist and Address:  [facility] [place] [place] [place] [address] [place], [place] [address]      Provider Number: [id number]  Attending Physician Name and Address:  Domenic Polite, MD  Relative Name and Phone Number:       Current Level of Care: Hospital Recommended Level of Care: [place] Prior Approval Number:    Date Approved/Denied:   PASRR Number: [id number]  Discharge Plan: SNF    Current Diagnoses: Patient Active Problem List   Diagnosis Date Noted   Leg pain 06/01/2022   Traumatic subdural hemorrhage, initial encounter ([place]) 08/16/2021   DNR (do not resuscitate) 06/12/2021   Possible Lytic bone lesions on CT 06/12/2021   Hyperglycemia 05/18/2021   Chronic congestive heart failure ([place]) 08/24/2019   Intertrigo 05/11/2017   OAB (overactive bladder) 02/22/2017   Lymphedema 01/26/2017   Gastroesophageal reflux disease without esophagitis 01/26/2017   Acquired hypothyroidism 01/26/2017   Mixed hyperlipidemia 01/26/2017   Generalized weakness 08/17/2015   Leukocytosis 08/17/2015   Pyrexia    Sleeps in sitting position due to orthopnea 07/31/2015   Morbid obesity due to excess calories ([place]) 06/09/2015   PVD (peripheral vascular disease) ([place]) 06/09/2015   Long term (current) use of anticoagulants 06/19/2010   Tachycardia-bradycardia syndrome ([place]) 11/18/2008   PPM-[place] 11/18/2008   HYPERTENSION, BENIGN 02/21/2008   Permanent atrial fibrillation ([place]) 02/21/2008    Orientation RESPIRATION BLADDER Height & Weight     Self, Time, Situation, Place  Normal Incontinent, External catheter Weight: 216 lb 11.4 oz (98.3 kg) Height:     BEHAVIORAL SYMPTOMS/MOOD NEUROLOGICAL BOWEL NUTRITION STATUS      Continent Diet (See  dc summary)  AMBULATORY STATUS COMMUNICATION OF NEEDS Skin   Extensive Assist Verbally Normal                       Personal Care Assistance Level of Assistance  Bathing, Feeding, Dressing Bathing Assistance: Maximum assistance Feeding assistance: Limited assistance Dressing Assistance: Maximum assistance     Functional Limitations Info  Sight, Hearing, Speech Sight Info: Adequate Hearing Info: Adequate Speech Info: Adequate    SPECIAL CARE FACTORS FREQUENCY  PT (By licensed PT), OT (By licensed OT)     PT Frequency: 5xweek OT Frequency: 5xweek            Contractures Contractures Info: Not present    Additional Factors Info  Code Status Code Status Info: DNR             Current Medications (06/02/2022):  This is the current hospital active medication list Current Facility-Administered Medications  Medication Dose Route Frequency Provider Last Rate Last Admin   acetaminophen (TYLENOL) tablet 650 mg  650 mg Oral Q6H PRN Shela Leff, MD   650 mg at 06/01/22 1203   atorvastatin (LIPITOR) tablet 40 mg  40 mg Oral Daily Domenic Polite, MD   40 mg at 06/02/22 [id number]   clopidogrel (PLAVIX) tablet 75 mg  75 mg Oral Daily Domenic Polite, MD   75 mg at 06/02/22 [id number]   diltiazem (CARDIZEM CD) 24 hr capsule 240 mg  240 mg Oral Daily Domenic Polite, MD   240 mg at 06/02/22 [id number]   HYDROcodone-acetaminophen (NORCO/VICODIN) 5-325 MG per tablet 1 tablet  1 tablet Oral Q6H  PRN Domenic Polite, MD   1 tablet at 06/02/22 [id number]   HYDROmorphone (DILAUDID) injection 1 mg  1 mg Intravenous Q4H PRN Domenic Polite, MD   1 mg at 06/01/22 1408   levETIRAcetam (KEPPRA) tablet 750 mg  750 mg Oral BID Domenic Polite, MD   750 mg at 06/02/22 [id number]   levothyroxine (SYNTHROID) tablet 150 mcg  150 mcg Oral Q0600 Domenic Polite, MD   150 mcg at 06/02/22 0606   metoprolol tartrate (LOPRESSOR) tablet 100 mg  100 mg Oral BID Domenic Polite, MD   100 mg at 06/02/22 0923   pantoprazole  (PROTONIX) EC tablet 40 mg  40 mg Oral Q1200 Domenic Polite, MD   40 mg at 06/01/22 1203   senna-docusate (Senokot-S) tablet 1 tablet  1 tablet Oral BID Domenic Polite, MD   1 tablet at 06/02/22 [id number]     Discharge Medications: Please see discharge summary for a list of discharge medications.  Relevant Imaging Results:  Relevant Lab Results:   Additional Information [id number]  Beckey Rutter, MSW, LCSWA, LCASA Transitions of Care  Clinical Social Worker I

## 2022-06-02 NOTE — Progress Notes (Signed)
At 1016 this morning, this nurse was assisting patient from chair to bed. Patient stood from chair and pivoted to bed with my assistance. Patient was trying to scoot her bottom onto bed better and began to slide to ground. This nurse was able to catch her and bear much of the weight. Patient did not hit her head and was flat on her bottom on ground. See flowsheets for v/s post-fall. Patient denies any new pain at this time but does endorse the pain she has been having in L leg. No new assessment findings. See flowsheets.

## 2022-06-02 NOTE — Plan of Care (Signed)
  Problem: Clinical Measurements: Goal: Diagnostic test results will improve Outcome: Progressing Goal: Respiratory complications will improve Outcome: Progressing   Problem: Coping: Goal: Level of anxiety will decrease Outcome: Progressing   Problem: Safety: Goal: Ability to remain free from injury will improve Outcome: Progressing

## 2022-06-02 NOTE — Progress Notes (Signed)
ANTICOAGULATION CONSULT NOTE - Initial Consult  Pharmacy Consult for heparin Indication:  arterial thrombus  No Known Allergies  Patient Measurements: Weight: 98.3 kg (216 lb 11.4 oz) Heparin Dosing Weight: 68kg  Vital Signs: Temp: 98.1 F (36.7 C) (03/06 1023) Temp Source: Oral (03/06 1023) BP: 116/55 (03/06 1340) Pulse Rate: 92 (03/06 1116)  Labs: Recent Labs    06/01/22 0043 06/02/22 0803 06/02/22 0836  HGB 13.7 13.1  --   HCT 40.7 40.7  --   PLT PLATELET CLUMPS NOTED ON SMEAR, UNABLE TO ESTIMATE 217  --   CREATININE 0.83  --  0.94    Estimated Creatinine Clearance: 51.9 mL/min (by C-G formula based on SCr of 0.94 mg/dL).   Medical History: Past Medical History:  Diagnosis Date   Acute diastolic heart failure (HCC)    Anxiety state, unspecified    Asthma    Atrial fibrillation (HCC)    Congestive heart failure, unspecified    Degenerative joint disease    GERD (gastroesophageal reflux disease)    Hyperlipidemia    Hypertension    Lymphedema    Obesity    OSA (obstructive sleep apnea) 06/09/2015   Persistent atrial fibrillation (HCC)     post-termination pauses with afib s/p PPM   Presence of permanent cardiac pacemaker    Sinoatrial node dysfunction (HCC)    s/p PPM   Sleep apnea    compliant with CPAP   Unspecified hypothyroidism    Unspecified venous (peripheral) insufficiency     Medications:  Medications Prior to Admission  Medication Sig Dispense Refill Last Dose   albuterol (VENTOLIN HFA) 108 (90 Base) MCG/ACT inhaler INHALE 2 PUFFS BY MOUTH EVERY 6 HOURS AS NEEDED FOR WHEEZING OR SHORTNESS OF BREATH Strength 108 (90 Base) MCG/ACT 18 g 0 unk   clotrimazole-betamethasone (LOTRISONE) cream APPLY  CREAM TOPICALLY TO AFFECTED AREA TWICE DAILY (Patient taking differently: Apply 1 application  topically 2 (two) times daily.) 45 g 0 unk   diclofenac Sodium (VOLTAREN) 1 % GEL Apply 4 g topically in the morning, at noon, and at bedtime.   unk    diltiazem (CARDIZEM CD) 240 MG 24 hr capsule Take 1 capsule (240 mg total) by mouth daily. 90 capsule 2 Past Week   levothyroxine (SYNTHROID) 150 MCG tablet Take 1 tablet (150 mcg total) by mouth daily. 30 tablet 11 Past Week   metoprolol tartrate (LOPRESSOR) 100 MG tablet Take 1 tablet by mouth twice daily 60 tablet 2 Past Week   Vitamin D, Ergocalciferol, (DRISDOL) 1.25 MG (50000 UNIT) CAPS capsule Take 1 capsule by mouth once a week (Patient taking differently: Take 50,000 Units by mouth every 7 (seven) days.) 12 capsule 1 Past Month   atorvastatin (LIPITOR) 80 MG tablet Take 1 tablet by mouth once daily (Patient not taking: Reported on 02/01/2022) 90 tablet 1    blood glucose meter kit and supplies Dispense based on patient and insurance preference. Use up to four times daily as directed. (FOR ICD-10 E10.9, E11.9). 1 each 0    esomeprazole (NEXIUM) 40 MG capsule Take 1 capsule (40 mg total) by mouth daily. (Patient not taking: Reported on 06/01/2022) 90 capsule 1 Not Taking   ipratropium-albuterol (DUONEB) 0.5-2.5 (3) MG/3ML SOLN Take 3 mLs by nebulization in the morning and at bedtime. (Patient not taking: Reported on 06/01/2022) 360 mL 0 Not Taking   levETIRAcetam (KEPPRA) 750 MG tablet Take 750 mg by mouth in the morning and at bedtime. (Patient not taking: Reported on 06/01/2022)  Not Taking   nystatin (MYCOSTATIN) 100000 UNIT/ML suspension Take 5 mLs (500,000 Units total) by mouth 4 (four) times daily. Swish and swallow (Patient not taking: Reported on 06/01/2022) 60 mL 0 Not Taking   Scheduled:   [MAR Hold] atorvastatin  40 mg Oral Daily   [MAR Hold] clopidogrel  75 mg Oral Daily   [MAR Hold] diltiazem  240 mg Oral Daily   [MAR Hold] levETIRAcetam  750 mg Oral BID   [MAR Hold] levothyroxine  150 mcg Oral Q0600   [MAR Hold] metoprolol tartrate  100 mg Oral BID   [MAR Hold] pantoprazole  40 mg Oral Q1200   [MAR Hold] senna-docusate  1 tablet Oral BID   Infusions:   sodium chloride 10 mL/hr  (06/02/22 1515)   [START ON 06/03/2022] heparin      Assessment: Pt used to be on coumadin for her AF but it was dced due to fall risk. She is s/p thrombectomy. Heparin has been ordered to be started 8 hr post sheath removal (~1600).  Hgb wnl Plt wnl  Goal of Therapy:  Heparin level 0.3-0.7 units/ml Monitor platelets by anticoagulation protocol: Yes   Plan:  Heparin infusion 1050 units/hr - start 0000 Check 8 hr HL then daily F/u with PO AC  Onnie Boer, PharmD, BCIDP, AAHIVP, CPP Infectious Disease Pharmacist 06/02/2022 4:37 PM

## 2022-06-02 NOTE — Evaluation (Signed)
Occupational Therapy Evaluation Patient Details Name: Megan [name] MRN: [id number] DOB: November 09, 1945 Today's Date: 06/02/2022   History of Present Illness Megan TORCIVIA is a 77 y.o. female presented to [facility] ED on 3/4 with left leg pain. CTA revealed postive fro acute appearing occlusion of distal L popliteal artery. Pt transferred to [facility] PMH: symptomatic second-degree AV block status post PPM, permanent A-fib not on anticoagulation due to history of fall and SDH, class III obesity (BMI 41.81), hypertension, chronic HFpEF, anxiety, COPD, GERD, hyperlipidemia, lymphedema, OSA, hypothyroidism, PVD   Clinical Impression   Pt admitted with above diagnosis and presents with the deficits listed below. Pt would benefit from cont OT to increase independence with all basic adls and adl transfers.  Pt currently at home in recliner all day and night except to get on [facility] for toileting. Pt lives with son but is alone a good part of the day.  Pt has ambulance take her to appts.  Feel pt is not safe to return home to this current living situation but could possibly improve with SNF level rehab to the point she may be able to return home at some point.  If 24 hour assist available, pt could return home.  Will continue to see with focus on adls in standing, progressing mobility during adls and activity tolerance.       Recommendations for follow up therapy are one component of a multi-disciplinary discharge planning process, led by the attending physician.  Recommendations may be updated based on patient status, additional functional criteria and insurance authorization.   Follow Up Recommendations  Skilled nursing-short term rehab (<3 hours/day)     Assistance Recommended at Discharge Frequent or constant Supervision/Assistance  Patient can return home with the following A lot of help with walking and/or transfers;A lot of help with bathing/dressing/bathroom;Assistance with cooking/housework;Assist  for transportation;Help with stairs or ramp for entrance    Functional Status Assessment  Patient has had a recent decline in their functional status and demonstrates the ability to make significant improvements in function in a reasonable and predictable amount of time.  Equipment Recommendations  None recommended by OT    Recommendations for Other Services       Precautions / Restrictions Precautions Precautions: Fall Precaution Comments: morbidly obese, watch O2 sats Restrictions Weight Bearing Restrictions: No      Mobility Bed Mobility Overal bed mobility: Needs Assistance Bed Mobility: Supine to Sit     Supine to sit: Min guard, HOB elevated     General bed mobility comments: used bed rail and  HOB elevated    Transfers Overall transfer level: Needs assistance Equipment used: Rolling walker (2 wheels) Transfers: Sit to/from Stand, Bed to chair/wheelchair/BSC Sit to Stand: Min assist, +2 safety/equipment     Step pivot transfers: Min assist, +2 safety/equipment     General transfer comment: assist needed for lines and safety more than hands on physical assist.      Balance Overall balance assessment: Needs assistance Sitting-balance support: Feet supported, No upper extremity supported Sitting balance-Leahy Scale: Fair     Standing balance support: Reliant on assistive device for balance, Bilateral upper extremity supported Standing balance-Leahy Scale: Poor Standing balance comment: dependent on RW                           ADL either performed or assessed with clinical judgement   ADL Overall ADL's : Needs assistance/impaired Eating/Feeding: Independent;Sitting   Grooming: Supervision/safety;Sitting  Grooming Details (indicate cue type and reason): in sitting only Upper Body Bathing: Minimal assistance;Sitting   Lower Body Bathing: Moderate assistance;Sit to/from stand;Cueing for compensatory techniques   Upper Body Dressing : Minimal  assistance;Sitting   Lower Body Dressing: Maximal assistance;Sit to/from stand;Cueing for compensatory techniques Lower Body Dressing Details (indicate cue type and reason): Pt states she knows how to use sock aid and reacher but does not have one at home.  Pt does not dress self Toilet Transfer: Minimal assistance;+2 for safety/equipment;Stand-pivot;BSC/3in1;Rolling walker (2 wheels)   Toileting- Clothing Manipulation and Hygiene: Maximal assistance;Sit to/from stand;Sitting/lateral lean Toileting - Clothing Manipulation Details (indicate cue type and reason): Pt states she sits and wipes bottom on towel at home     Functional mobility during ADLs: Minimal assistance;+2 for physical assistance General ADL Comments: Pt limited with all LE adls due to body habitus and inability to stand for long periods of time.     Vision Baseline Vision/History: 1 Wears glasses Ability to See in Adequate Light: 0 Adequate Patient Visual Report: No change from baseline Vision Assessment?: No apparent visual deficits     Perception Perception Perception Tested?: No   Praxis Praxis Praxis tested?: Within functional limits    Pertinent Vitals/Pain Pain Assessment Pain Assessment: Faces Faces Pain Scale: Hurts even more Pain Location: L LE to touch Pain Descriptors / Indicators: Aching Pain Intervention(s): Limited activity within patient's tolerance, Monitored during session, Repositioned     Hand Dominance Right   Extremity/Trunk Assessment Upper Extremity Assessment Upper Extremity Assessment: Generalized weakness   Lower Extremity Assessment Lower Extremity Assessment: Defer to PT evaluation   Cervical / Trunk Assessment Cervical / Trunk Assessment: Normal   Communication Communication Communication: No difficulties   Cognition Arousal/Alertness: Awake/alert Behavior During Therapy: WFL for tasks assessed/performed Overall Cognitive Status: No family/caregiver present to determine  baseline cognitive functioning                                 General Comments: pt with decreased insight to safety, doesn't appear to recognize she can't care for herself. Slow processing at times.  Decreased awareness in general     General Comments  Pt limited by body habitus, general weakness.    Exercises     Shoulder Instructions      Home Living Family/patient expects to be discharged to:: Private residence Living Arrangements: Children Available Help at Discharge: Family;Available PRN/intermittently Type of Home: House Home Access: Stairs to enter [employer] of Steps: 5 Entrance Stairs-Rails: Right Home Layout: One level     Bathroom Shower/Tub: Sponge bathes at baseline         Home Equipment: Conservation officer, nature (2 wheels);Wheelchair - manual;BSC/3in1   Additional Comments: pt stays in recliner day and night,  Only leaves recliner to use BSC.      Prior Functioning/Environment Prior Level of Function : Needs assist       Physical Assist : Mobility (physical);ADLs (physical) Mobility (physical): Gait ADLs (physical): Bathing;Dressing;Toileting;IADLs Mobility Comments: pt reports only completing step pvt transfers to/from recliner/BSC. Pt sleeps in recliner and stays in recliner all day ADLs Comments: pt unable to perform hygiene s/p tolieting and reports rubbing her bottom on towel when she sits down on the chair stating "I do what I can". Pt reports son brings her food that will last all day. She goes to [facility] hospital by ambulance        OT Problem List:  Decreased strength;Decreased range of motion;Decreased activity tolerance;Impaired balance (sitting and/or standing);Decreased safety awareness;Decreased knowledge of use of DME or AE;Obesity;Pain      OT Treatment/Interventions: Self-care/ADL training;DME and/or AE instruction;Therapeutic exercise;Therapeutic activities;Balance training    OT Goals(Current goals can be found in  the care plan section) Acute Rehab OT Goals Patient Stated Goal: would be willing to work on walking more if pain improved in legs. wants to be more independent overall OT Goal Formulation: With patient Time For Goal Achievement: 06/16/22 Potential to Achieve Goals: Fair ADL Goals Pt Will Perform Lower Body Bathing: with supervision;with adaptive equipment;sit to/from stand Pt Will Perform Lower Body Dressing: with supervision;with adaptive equipment;sit to/from stand Additional ADL Goal #1: Pt will transfer to 3:1 BSC with rolling walker and complete all toileting tasks with supervision. Additional ADL Goal #2: Pt wills state two things she can do at home to conserve energy while completing adls on her own to save energy without cues.  OT Frequency: Min 2X/week    Co-evaluation              AM-PAC OT "6 Clicks" Daily Activity     Outcome Measure Help from another person eating meals?: None Help from another person taking care of personal grooming?: A Little Help from another person toileting, which includes using toliet, bedpan, or urinal?: A Lot Help from another person bathing (including washing, rinsing, drying)?: A Lot Help from another person to put on and taking off regular upper body clothing?: A Little Help from another person to put on and taking off regular lower body clothing?: A Lot 6 Click Score: 16   End of Session Equipment Utilized During Treatment: Rolling walker (2 wheels);Oxygen;Gait belt Nurse Communication: Mobility status;Other (comment) (chair alarm not working)  Activity Tolerance: Patient tolerated treatment well Patient left: in chair;with nursing/sitter in room;Other (comment) (chair alarm not working; nursing aware.)  OT Visit Diagnosis: Unsteadiness on feet (R26.81)                Time: [id number] OT Time Calculation (min): 40 min Charges:  OT General Charges $OT Visit: 1 Visit OT Evaluation $OT Eval Moderate Complexity: 1 Mod OT  Treatments $Self Care/Home Management : 8-22 mins  Glenford Peers 06/02/2022, 9:41 AM

## 2022-06-02 NOTE — Progress Notes (Signed)
PROGRESS NOTE    Megan [name]  [id number] DOB: 24-Sep-1945 DOA: 05/31/2022 PCP: Sharion Balloon, FNP  Obese chronically ill female 77/F with history of second-degree AV block with PPM, permanent A-fib not on anticoagulation following history of fall and SDH, hypertension, chronic diastolic CHF, COPD, anxiety, lymphedema, OSA, hypothyroidism presented to [facility] ED with left knee/leg pain.  Afebrile, vital signs stable, CT a of abdominal aorta with [name] noted acute appearing occlusion in the distal left popliteal artery extending to trifurcation vessels, posterior tibial appeared occluded posteriorly   Subjective: -Has some knee pain, denies any other symptoms at this time  Assessment and Plan:  Left knee pain -Suspect pain is primarily secondary to osteoarthritis and unrelated to distal PAD -X-ray with severe degenerative changes especially in the medial compartment, PT eval, Vicodin as needed  Peripheral vascular disease, occlusion of the distal left popliteal artery -CTA abdominal aorta and bilateral iliofemoral runoff W Wo contrast showing acute appearing occlusion of the distal left popliteal artery , posterior tibial artery appears occluded proximally. The PTA does reconstitute via collateral flow.  -Vascular surgery consulting, occlusion felt to be chronic -Started on Plavix, discussed with vascular surgery Dr. Donzetta Matters and patient at length again today.  Known A-fib no longer on anticoagulation following SDH 10 months ago, if her occlusion is acute and secondary to thrombus she is agreeable to resume anticoagulation and accept the risk of bleeding, will also check CT head for follow-up of prior subdural hematoma -PT eval, very poor functional status at baseline mostly nonambulatory, stays in recliner all day and night   Mild leukocytosis Likely reactive, afebrile and nontoxic, improving   History of symptomatic second-degree AV block status post PPM -Due for  pacemaker battery replacement, will send message to EP   Permanent A-fib Not on anticoagulation following a fall and subdural hematoma in 5/23, was admitted at [facility] then -Continue metoprolol and Cardizem -See discussion above, will start IV heparin if her angiogram is suggestive of acute/subacute thrombus   Chronic HFpEF CT showing small right pleural effusion.   -Add low-dose p.o. Lasix tomorrow  COPD: Stable, no signs of acute exacerbation.  Hypothyroidism -Continue Synthroid  Seizure disorder -Resumed Keppra   DVT prophylaxis:SCDs Code Status: DNR Family Communication: None present Disposition Plan: To be determined, may need SNF  Consultants: Vascular surgery   Procedures:   Antimicrobials:    Objective: Vitals:   06/01/22 2237 06/02/22 0023 06/02/22 0419 06/02/22 0807  BP: (!) 112/52 107/68 115/63 102/60  Pulse: 67 75 85 72  Resp: '14 17 20 15  '$ Temp:  97.9 F (36.6 C) 97.7 F (36.5 C) 97.9 F (36.6 C)  TempSrc:  Oral Oral Oral  SpO2:  91% 98% 93%  Weight:  98.3 kg      Intake/Output Summary (Last 24 hours) at 06/02/2022 1021 Last data filed at 06/02/2022 0420 Gross per 24 hour  Intake 550.68 ml  Output 450 ml  Net 100.68 ml   Filed Weights   05/31/22 1950 06/01/22 0023 06/02/22 0023  Weight: 97.1 kg 97.5 kg 98.3 kg    Examination:  General exam: Obese chronically ill female sitting up in bed, AAOx3, no distress HEENT: No JVD Lungs: Clear bilaterally Abdomen: Soft, nontender, bowel sounds present Extremities: Dry scaly lower extremity, unable to palpate light dorsalis pedis pulses on both legs, delayed capillary refill Psychiatry:  Mood & affect appropriate.     Data Reviewed:   CBC: Recent Labs  Lab 06/01/22 0043 06/02/22 [phone]  WBC 15.3* 11.6*  HGB 13.7 13.1  HCT 40.7 40.7  MCV 91.9 94.4  PLT PLATELET CLUMPS NOTED ON SMEAR, UNABLE TO ESTIMATE [id number]   Basic Metabolic Panel: Recent Labs  Lab 06/01/22 0043  NA 131*  K 4.4  CL 100   CO2 20*  GLUCOSE 96  BUN 7*  CREATININE 0.83  CALCIUM 8.4*   GFR: Estimated Creatinine Clearance: 58.7 mL/min (by C-G formula based on SCr of 0.83 mg/dL). Liver Function Tests: No results for input(s): "AST", "ALT", "ALKPHOS", "BILITOT", "PROT", "ALBUMIN" in the last 168 hours. No results for input(s): "LIPASE", "AMYLASE" in the last 168 hours. No results for input(s): "AMMONIA" in the last 168 hours. Coagulation Profile: No results for input(s): "INR", "PROTIME" in the last 168 hours. Cardiac Enzymes: No results for input(s): "CKTOTAL", "CKMB", "CKMBINDEX", "TROPONINI" in the last 168 hours. BNP (last 3 results) No results for input(s): "PROBNP" in the last 8760 hours. HbA1C: No results for input(s): "HGBA1C" in the last 72 hours. CBG: No results for input(s): "GLUCAP" in the last 168 hours. Lipid Profile: No results for input(s): "CHOL", "HDL", "LDLCALC", "TRIG", "CHOLHDL", "LDLDIRECT" in the last 72 hours. Thyroid Function Tests: No results for input(s): "TSH", "T4TOTAL", "FREET4", "T3FREE", "THYROIDAB" in the last 72 hours. Anemia Panel: No results for input(s): "VITAMINB12", "FOLATE", "FERRITIN", "TIBC", "IRON", "RETICCTPCT" in the last 72 hours. Urine analysis:    Component Value Date/Time   COLORURINE YELLOW 05/17/2021 1822   APPEARANCEUR CLOUDY (A) 05/17/2021 1822   APPEARANCEUR Cloudy (A) 04/02/2021 1448   LABSPEC 1.025 05/17/2021 1822   PHURINE 5.0 05/17/2021 1822   GLUCOSEU NEGATIVE 05/17/2021 1822   HGBUR SMALL (A) 05/17/2021 1822   BILIRUBINUR NEGATIVE 05/17/2021 1822   BILIRUBINUR Negative 04/02/2021 1448   KETONESUR 5 (A) 05/17/2021 1822   PROTEINUR 30 (A) 05/17/2021 1822   UROBILINOGEN 0.2 02/01/2014 1811   NITRITE NEGATIVE 05/17/2021 1822   LEUKOCYTESUR MODERATE (A) 05/17/2021 1822   Sepsis Labs: '@LABRCNTIP'$ (procalcitonin:4,lacticidven:4)  )No results found for this or any previous visit (from the past 240 hour(s)).   Radiology Studies: VAS [place] ABI  WITH/WO TBI  Result Date: 06/01/2022  LOWER EXTREMITY DOPPLER STUDY Patient Name:  Megan [name]  Date of Exam:   06/01/2022 Medical Rec #: [id number]         Accession #:    [id number] Date of Birth: [birth year]         Patient Gender: F Patient Age:   51 years Exam Location:  [facility] Procedure:      VAS [place] ABI WITH/WO TBI Referring Phys: Servando Snare --------------------------------------------------------------------------------  Indications: Peripheral artery disease. High Risk Factors: Hypertension, hyperlipidemia, past history of smoking.  Limitations: Today's examination was extremely limited secondary to patient pain              with cuff inflation. Comparison Study: 05-31-2022 CTA abdominal aorta w/ lower extremity at outside                   facility. acute appearing occlusion of the distal left                   popliteal artery beginning at the P3 segment and extending                   into the trifurcation vessels. The anterior tibial and                   peroneal arteries remain patent but the posterior  tibial                   artery appears occluded proximally. The PTA does reconstitute                   via collateral flow. Performing Technologist: Darlin Coco RDMS RVT  Examination Guidelines: A complete evaluation includes at minimum, Doppler waveform signals and systolic blood pressure reading at the level of bilateral brachial, anterior tibial, and posterior tibial arteries, when vessel segments are accessible. Bilateral testing is considered an integral part of a complete examination. Photoelectric Plethysmograph (PPG) waveforms and toe systolic pressure readings are included as required and additional duplex testing as needed. Limited examinations for reoccurring indications may be performed as noted.  ABI Findings: +---------+------------------+-----+-----------+-------------------------------+ Right    Rt Pressure (mmHg)IndexWaveform   Comment                          +---------+------------------+-----+-----------+-------------------------------+ Brachial 148                    triphasic                                  +---------+------------------+-----+-----------+-------------------------------+ PTA                             multiphasicUnable to obtain pressure                                                  secondary to patient pain       +---------+------------------+-----+-----------+-------------------------------+ DP       255               1.70 multiphasicSuboptimal secondary to patient                                            pain                            +---------+------------------+-----+-----------+-------------------------------+ Great Toe34                0.23 Abnormal                                   +---------+------------------+-----+-----------+-------------------------------+ +---------+------------------+-----+----------+--------------------------------+ Left     Lt Pressure (mmHg)IndexWaveform  Comment                          +---------+------------------+-----+----------+--------------------------------+ Brachial 150                                                               +---------+------------------+-----+----------+--------------------------------+ PTA  monophasicUnable to obtain pressure                                                  secondary to patient pain        +---------+------------------+-----+----------+--------------------------------+ DP       92                0.61 monophasicSuboptimal secondary to patient                                            pain                             +---------+------------------+-----+----------+--------------------------------+ Great Toe0                 0.00 Absent                                     +---------+------------------+-----+----------+--------------------------------+  Arterial wall calcification precludes accurate ankle pressures and ABIs.  Summary: Right: Resting right ankle-brachial index indicates noncompressible right lower extremity arteries. The right toe-brachial index is abnormal. Left: Resting left ankle-brachial index indicates moderate left lower extremity arterial disease; however, arterial calcification may preclude accurate pressures. The left toe-brachial index is absent. *See table(s) above for measurements and observations.  Electronically signed by Harold Barban MD on 06/01/2022 at 10:12:47 PM.    Final    DG Knee Complete 4 Views Left  Result Date: 06/01/2022 CLINICAL DATA:  Chronic left knee pain EXAM: LEFT KNEE - COMPLETE 4+ VIEW COMPARISON:  Left tibia done on 03/11/2017 FINDINGS: No recent fracture or dislocation is seen. There is no significant effusion in suprapatellar bursa. Osteopenia is seen in bony structures. Degenerative changes are noted with bony spurs in medial, lateral and patellofemoral compartments, more prominent in the medial compartment. There is narrowing of joint space in medial compartment. IMPRESSION: No recent fracture or dislocation is seen in left knee. Osteopenia. Degenerative changes are noted, more severe in the medial compartment. Electronically Signed   By: Elmer Picker M.D.   On: 06/01/2022 10:02     Scheduled Meds:  atorvastatin  40 mg Oral Daily   clopidogrel  75 mg Oral Daily   diltiazem  240 mg Oral Daily   levETIRAcetam  750 mg Oral BID   levothyroxine  150 mcg Oral Q0600   metoprolol tartrate  100 mg Oral BID   pantoprazole  40 mg Oral Q1200   senna-docusate  1 tablet Oral BID   Continuous Infusions:   LOS: 2 days    Time spent: 77mn    PDomenic Polite MD [facility]   06/02/2022, 10:21 AM

## 2022-06-02 NOTE — Progress Notes (Signed)
At bedside for PIV insertion. Pt currently in the bedside chair. Primary RN assisting pt to bed, and pt slid to floor with assist from RN. Will return after pt settled.

## 2022-06-02 NOTE — Progress Notes (Signed)
  Progress Note    06/02/2022 8:13 AM * No surgery date entered *   Still complaining of severe left leg pain Food plate was delivered to her room this morning despite NPO order. Advised patient not to eat. Please keep NPO She is scheduled for Aortogram, Arteriogram today with Dr. Virl Cagey I reviewed procedure with her and all her questions were answered Consent in room to be signed   Karoline Caldwell, PA-C [facility] [phone] 06/02/2022 8:13 AM

## 2022-06-02 NOTE — Op Note (Addendum)
Patient name: Megan [name] MRN: [id number] DOB: Apr 15, 1945 Sex: female  06/02/2022 Pre-operative Diagnosis: Left lower extremity acute limb ischemia [place] 1 Post-operative diagnosis:  Same Surgeon:  Broadus John, MD Procedure Performed: 1.  Ultrasound-guided micropuncture access of the right common femoral artery 2.  Aortogram 3.  Secondary cannulation, left lower extremity angiogram 4.  Mechanical thrombectomy using 7 French penumbra lightening system to the popliteal artery, anterior tibial artery, tibioperoneal trunk, peroneal artery 5.  Moderate sedation 82 minutes, contrast volume 130 mL   Indications: Patient is a 77 year old female who presented with new onset left lower extremity pain.  CT scan demonstrated embolus in the left popliteal artery extending into the tibioperoneal trunk.  No flow distally.  Mercer had rest pain, no sensory or motor deficits.  She was a poor surgical candidate.  After discussing the risk and benefits of left lower extremity angiogram with percutaneous mechanical thrombectomy, Megan [name] elected to proceed.  Etiology believed to be A-fib embolus after discontinuing anticoagulation due to fall with head bleed last year.  Findings:  No flow-limiting stenosis appreciated in the aortoiliac segments bilaterally On the left, widely patent common femoral artery, SFA, profunda.  The P2 segment of the popliteal artery demonstrates a flush occlusion with no named outflow vessels.  The anterior tibial artery reconstituted at the mid tibia and the posterior tibial artery appears to reconstitute via a series of collaterals at the distal tibia.   Procedure:  The patient was identified in the holding area and taken to room 8.  The patient was then placed supine on the table and prepped and draped in the usual sterile fashion.  A time out was called.  Ultrasound was used to evaluate the right common femoral artery.  It was patent .  A digital ultrasound image was  acquired.  A micropuncture needle was used to access the right common femoral artery under ultrasound guidance.  An 018 wire was advanced without resistance and a micropuncture sheath was placed.  The 018 wire was removed and a benson wire was placed.  The micropuncture sheath was exchanged for a 5 french sheath.  An omniflush catheter was advanced over the wire to the level of L-1.  An abdominal angiogram was obtained.  Next, using the omniflush catheter and a benson wire, the aortic bifurcation was crossed and the catheter was placed into theleft external iliac artery and left runoff was obtained.   I elected to intervene on the left popliteal occlusion.  The patient was heparinized with 8000 units IV heparin and a 7 [place] by 60 cm sheath was brought onto the field.  There was some difficulty bringing the sheath up and over the aortic bifurcation, however was able to do so with the use of an Amplatz wire.  This wire was then switched out and a series of wires and catheters were used to traverse the popliteal artery lesion.  The wire preferentially chose the anterior tibial artery, therefore I elected to begin percutaneous mechanical thrombectomy from the P2 segment of the popliteal artery into the anterior tibial artery using the 7 French penumbra lightening system.  This took a considerable mount time, however I was able to retrieve what appeared to be A-fib embolus which had an acute component on the more proximal portion of the embolus.  Once the anterior tibial artery was recanalized, I moved to the tibioperoneal trunk.  I was able to pass wire into the peroneal artery, and suction thrombectomy of the tibioperoneal trunk  followed into the peroneal artery.  Follow-up angiography demonstrated excellent result with reconstitution of the TPT trunk and peroneal artery.  I could not find the ostia of the posterior tibial artery, and therefore elected to terminate the case.  The right-sided access site was  closed with the use of a ProGlide device without issue.  I was aware that another device would result in failure, especially due to the patient's morbid obesity.  Being that this is an A-fib embolus, will need to discuss therapeutic anticoagulation.  I realize this was discontinued due to a fall, however risk and benefits discussion needs to occur.  Will heparinize at the 8-hour mark, and keep bedrest.  Impression: Successful mechanical thrombectomy using 7 French penumbra lightening system to the popliteal artery, anterior tibial artery, tibioperoneal trunk, peroneal artery  [name], MD [facility] [place] Office: [phone]

## 2022-06-02 NOTE — Progress Notes (Addendum)
  Progress Note    06/02/2022 1:55 PM Day of Surgery  Subjective: Still having left knee and calf pain  Vitals:   06/02/22 1023 06/02/22 1116  BP: 121/88 (!) 99/54  Pulse: 78 92  Resp: 18 18  Temp: 98.1 F (36.7 C)   SpO2: 95% 98%    Physical Exam: Awake alert and oriented Strong right dorsalis pedis signal and weak monophasic left dorsalis pedis signal obese  CBC    Component Value Date/Time   WBC 11.6 (H) 06/02/2022 0803   RBC 4.31 06/02/2022 0803   HGB 13.1 06/02/2022 0803   HGB 13.6 09/10/2021 1529   HCT 40.7 06/02/2022 0803   HCT 41.0 09/10/2021 1529   PLT 217 06/02/2022 0803   PLT 203 09/10/2021 1529   MCV 94.4 06/02/2022 0803   MCV 96 09/10/2021 1529   MCH 30.4 06/02/2022 0803   MCHC 32.2 06/02/2022 0803   RDW 15.4 06/02/2022 0803   RDW 13.9 09/10/2021 1529   LYMPHSABS 1.0 09/10/2021 1529   MONOABS 1.0 06/12/2021 0746   EOSABS 0.1 09/10/2021 1529   BASOSABS 0.1 09/10/2021 1529    BMET    Component Value Date/Time   NA 134 (L) 06/02/2022 0836   NA 140 09/10/2021 1529   K 4.5 06/02/2022 0836   CL 102 06/02/2022 0836   CO2 18 (L) 06/02/2022 0836   GLUCOSE 91 06/02/2022 0836   BUN 9 06/02/2022 0836   BUN 8 09/10/2021 1529   CREATININE 0.94 06/02/2022 0836   CALCIUM 8.7 (L) 06/02/2022 0836   GFRNONAA >60 06/02/2022 0836   GFRAA 62 02/12/2020 0933    INR    Component Value Date/Time   INR 3.1 (A) 08/03/2021 1449   INR 2.2 (H) 06/13/2021 0534   INR 2.7 05/22/2010 1333     Intake/Output Summary (Last 24 hours) at 06/02/2022 1355 Last data filed at 06/02/2022 0420 Gross per 24 hour  Intake 313.68 ml  Output 450 ml  Net -136.32 ml      Assessment:  77 y.o. female is here with left lower extremity pain.  Ct scan demonstrates thrombus at the popliteal artery and certainly there is some concern that this is acute without any known injury to the knee. Pt with history of Afib - likely Afib embolus as she was taken off anticoagulation s/p fall.    Plan: Angiogram today with me in an effort to define and improve distal perfusion. My plan is for percutaneous mechanical thrombectomy.  After discussing the risks and benefits of surgery, Ski elected to proceed. I have reached out to her son to update him on the above. He did not answer. I will try calling him back. She is aware there is a risk of embolization, need for open surgery, limb loss. Without surgery she will lose the left leg.     Broadus John  [facility] [place] Office: [phone] Pager: [phone]  06/02/2022 1:55 PM

## 2022-06-02 NOTE — TOC Initial Note (Signed)
Transition of Care [facility]) - Initial/Assessment Note    Patient Details  Name: Megan [name] MRN: [id number] Date of Birth: 06/04/1945  Transition of [facility]) CM/SW Contact:    Bjorn Pippin, LCSW Phone Number: 06/02/2022, 12:04 PM  Clinical Narrative:                 CSW met with pt at bedside to discuss the recommendation  for STR at SNF. Pt is agreeable to dc to SNF when medically stable. Pt reported that she has previously gone to SNF before but couldn't recall which facility. CSW informed pt that a referral will be faxed to several facilities and pt will be able to choose from bed offers. Pt requested to be near [place], [place].  CSW completed fl2 and faxed out. TOC will continue to follow this admission.   Expected Discharge Plan: Skilled Nursing Facility Barriers to Discharge: Continued Medical Work up   Patient Goals and CMS Choice            Expected Discharge Plan and Services       Living arrangements for the past 2 months: Single Family Home                                      Prior Living Arrangements/Services Living arrangements for the past 2 months: Single Family Home Lives with:: Adult Children Patient language and need for interpreter reviewed:: Yes        Need for Family Participation in Patient Care: Yes (Comment) Care giver support system in place?: Yes (comment)   Criminal Activity/Legal Involvement Pertinent to Current Situation/Hospitalization: No - Comment as needed  Activities of Daily Living Home Assistive Devices/Equipment: Other (Comment) ADL Screening (condition at time of admission) Patient's cognitive ability adequate to safely complete daily activities?: Yes Is the patient deaf or have difficulty hearing?: No Does the patient have difficulty seeing, even when wearing glasses/contacts?: No Does the patient have difficulty concentrating, remembering, or making decisions?: No Patient able to express need for assistance with  ADLs?: Yes Does the patient have difficulty dressing or bathing?: Yes Independently performs ADLs?: No Communication: Independent Dressing (OT): Dependent Is this a change from baseline?: Pre-admission baseline Grooming: Dependent Is this a change from baseline?: Pre-admission baseline Feeding: Independent Bathing: Dependent Is this a change from baseline?: Pre-admission baseline Toileting: Dependent Is this a change from baseline?: Pre-admission baseline In/Out Bed: Dependent Is this a change from baseline?: Pre-admission baseline Walks in Home: Dependent Is this a change from baseline?: Pre-admission baseline Does the patient have difficulty walking or climbing stairs?: Yes Weakness of Legs: Both Weakness of Arms/Hands: Both  Permission Sought/Granted                  Emotional Assessment Appearance:: Appears stated age Attitude/Demeanor/Rapport: Engaged Affect (typically observed): Accepting Orientation: : Oriented to Self, Oriented to Place, Oriented to  Time, Oriented to Situation Alcohol / Substance Use: Not Applicable Psych Involvement: No (comment)  Admission diagnosis:  Leg pain [M79.606] Patient Active Problem List   Diagnosis Date Noted   Leg pain 06/01/2022   Traumatic subdural hemorrhage, initial encounter ([place]) 08/16/2021   DNR (do not resuscitate) 06/12/2021   Possible Lytic bone lesions on CT 06/12/2021   Hyperglycemia 05/18/2021   Chronic congestive heart failure ([place]) 08/24/2019   Intertrigo 05/11/2017   OAB (overactive bladder) 02/22/2017   Lymphedema 01/26/2017   Gastroesophageal reflux disease  without esophagitis 01/26/2017   Acquired hypothyroidism 01/26/2017   Mixed hyperlipidemia 01/26/2017   Generalized weakness 08/17/2015   Leukocytosis 08/17/2015   Pyrexia    Sleeps in sitting position due to orthopnea 07/31/2015   Morbid obesity due to excess calories ([place]) 06/09/2015   PVD (peripheral vascular disease) ([place]) 06/09/2015   Long term  (current) use of anticoagulants 06/19/2010   Tachycardia-bradycardia syndrome ([place]) 11/18/2008   PPM-St.Jude 11/18/2008   HYPERTENSION, BENIGN 02/21/2008   Permanent atrial fibrillation ([place]) 02/21/2008   PCP:  Sharion Balloon, FNP Pharmacy:   [facility] [address] [place] [address] [place]  [address] Phone: [phone] Fax: [phone]  [place], [place] [address] Phone: [phone] Fax: [phone]  [place] [address], [place] [address] Phone: [phone] Fax: [phone]     Social Determinants of Health (SDOH) Social History: [place]: No Food Insecurity (06/01/2022)  Housing: Low Risk  (06/01/2022)  Transportation Needs: No Transportation Needs (06/01/2022)  Utilities: Not At Risk (06/01/2022)  Alcohol Screen: Low Risk  (12/14/2021)  Depression (PHQ2-9): Low Risk  (12/14/2021)  Recent Concern: Depression (PHQ2-9) - Medium Risk (10/22/2021)  Financial Resource Strain: Low Risk  (12/14/2021)  Physical Activity: Inactive (12/14/2021)  Social Connections: Socially Isolated (12/14/2021)  Stress: No Stress Concern Present (12/14/2021)  Tobacco Use: Medium Risk (06/01/2022)   SDOH Interventions:     Readmission Risk Interventions     No data to display         Beckey Rutter, MSW, LCSWA, LCASA Transitions of Care  Clinical Social Worker I

## 2022-06-03 ENCOUNTER — Inpatient Hospital Stay (HOSPITAL_COMMUNITY): Payer: Medicare Other

## 2022-06-03 DIAGNOSIS — I639 Cerebral infarction, unspecified: Secondary | ICD-10-CM

## 2022-06-03 DIAGNOSIS — I361 Nonrheumatic tricuspid (valve) insufficiency: Secondary | ICD-10-CM | POA: Diagnosis not present

## 2022-06-03 DIAGNOSIS — I739 Peripheral vascular disease, unspecified: Secondary | ICD-10-CM | POA: Diagnosis not present

## 2022-06-03 LAB — ECHOCARDIOGRAM COMPLETE
Area-P 1/2: 3.21 cm2
S' Lateral: 2.2 cm
Weight: 3509.72 oz

## 2022-06-03 LAB — CBC
HCT: 35.5 % — ABNORMAL LOW (ref 36.0–46.0)
Hemoglobin: 12 g/dL (ref 12.0–15.0)
MCH: 31.2 pg (ref 26.0–34.0)
MCHC: 33.8 g/dL (ref 30.0–36.0)
MCV: 92.2 fL (ref 80.0–100.0)
Platelets: 301 10*3/uL (ref 150–400)
RBC: 3.85 MIL/uL — ABNORMAL LOW (ref 3.87–5.11)
RDW: 15.4 % (ref 11.5–15.5)
WBC: 13.1 10*3/uL — ABNORMAL HIGH (ref 4.0–10.5)
nRBC: 0 % (ref 0.0–0.2)

## 2022-06-03 LAB — LIPID PANEL
Cholesterol: 215 mg/dL — ABNORMAL HIGH (ref 0–200)
HDL: 44 mg/dL (ref >40)
LDL Cholesterol: 145 mg/dL — ABNORMAL HIGH (ref 0–99)
Total CHOL/HDL Ratio: 4.9 RATIO
Triglycerides: 132 mg/dL (ref <150)
VLDL: 26 mg/dL (ref 0–40)

## 2022-06-03 LAB — BASIC METABOLIC PANEL
Anion gap: 13 (ref 5–15)
BUN: 11 mg/dL (ref 8–23)
CO2: 19 mmol/L — ABNORMAL LOW (ref 22–32)
Calcium: 8.3 mg/dL — ABNORMAL LOW (ref 8.9–10.3)
Chloride: 102 mmol/L (ref 98–111)
Creatinine, Ser: 0.8 mg/dL (ref 0.44–1.00)
GFR, Estimated: >60 mL/min (ref >60)
Glucose, Bld: 105 mg/dL — ABNORMAL HIGH (ref 70–99)
Potassium: 4.6 mmol/L (ref 3.5–5.1)
Sodium: 134 mmol/L — ABNORMAL LOW (ref 135–145)

## 2022-06-03 LAB — BRAIN NATRIURETIC PEPTIDE: B Natriuretic Peptide: 199.2 pg/mL — ABNORMAL HIGH (ref 0.0–100.0)

## 2022-06-03 LAB — HEPARIN LEVEL (UNFRACTIONATED): Heparin Unfractionated: 0.36 IU/mL (ref 0.30–0.70)

## 2022-06-03 MED ORDER — PERFLUTREN LIPID MICROSPHERE
1.0000 mL | INTRAVENOUS | Status: AC | PRN
  Administered 2022-06-03: 2 mL via INTRAVENOUS

## 2022-06-03 MED ORDER — FUROSEMIDE 20 MG PO TABS
20.0000 mg | ORAL_TABLET | Freq: Every day | ORAL | Status: DC
  Administered 2022-06-03 – 2022-06-05 (×3): 20 mg via ORAL
  Filled 2022-06-03 (×3): qty 1

## 2022-06-03 NOTE — Progress Notes (Signed)
Echocardiogram 2D Echocardiogram has been performed.  Megan [name] 06/03/2022, 1:54 PM

## 2022-06-03 NOTE — Progress Notes (Addendum)
PROGRESS NOTE    Megan [name]  [id number] DOB: Feb 11, 1946 DOA: 05/31/2022 PCP: Megan Balloon, FNP  No chief complaint on file.   Brief Narrative:   Obese chronically ill female 77/F with history of second-degree AV block with PPM, permanent Megan [name]-fib not on anticoagulation following history of fall and SDH, hypertension, chronic diastolic CHF, COPD, anxiety, lymphedema, OSA, hypothyroidism presented to [name] ED with left knee/leg pain. Afebrile, vital signs stable, CT Megan [name] of abdominal aorta with [name] noted acute appearing occlusion in the distal left popliteal artery extending to trifurcation vessels, posterior tibial appeared occluded posteriorly   Assessment & Plan:   Principal Problem:   PVD (peripheral Megan disease) (HCC) Active Problems:   HYPERTENSION, BENIGN   Leukocytosis   Acquired hypothyroidism   Mixed hyperlipidemia   Chronic congestive heart failure (HCC)   Leg pain  Concern for Acute/Subacute Stroke CT head obtained given prior hx of SDH and was notable for findings concerning for acute infarct Her pacemaker is not MR compatible, confirmed with Megan [name] via Megan [name] [name] consult  Echo, carotid US Lipid, A1c PT/OT/SLP   Peripheral Megan disease, occlusion of the distal left popliteal artery -CTA abdominal aorta and bilateral iliofemoral runoff W Wo contrast showing acute appearing occlusion of the distal left popliteal artery , posterior tibial artery appears occluded proximally. The PTA does reconstitute via collateral flow.  -s/p left lower extremity angiogram 3/6 by Megan, mechanical thrombectomy -appreciate Megan recs, likely afib embolus, needs anticoagulation -continuing plavix and statin  -PT eval, very poor functional status at baseline mostly nonambulatory, stays in recliner all day and night   Mild leukocytosis Likely reactive, afebrile and nontoxic, improving   Left knee pain -Suspect pain is primarily  secondary to osteoarthritis and unrelated to distal PAD -X-ray with severe degenerative changes especially in the medial compartment, PT eval, Vicodin as needed  History of symptomatic second-degree AV block status post PPM -Due for pacemaker battery replacement, will discuss with Megan [name] -per discussion with Megan [name], not at [facility] yet, no urgency for follow up yet   Permanent Megan [name]-fib Not on anticoagulation following Megan [name] fall and subdural hematoma in 5/23, was admitted at [name] then -Continue metoprolol and Cardizem -heparin gtt   Chronic HFpEF CT showing small right pleural effusion (care everywhere) -Add low-dose p.o. Lasix tomorrow.  Follow BNP.   COPD: Stable, no signs of acute exacerbation.   Hypothyroidism -Continue Synthroid   Seizure disorder -Keppra - wasn't taking this outpatient? Will need to follow up, med rec showed she wasn't taking this    DVT prophylaxis: heparin gtt Code Status: DNR Family Communication: none Disposition:   Status is: Inpatient Remains inpatient appropriate because: need for continued w/u   Consultants:  Megan [name]  Procedures:  3/6 1.  Ultrasound-guided micropuncture access of the right common femoral artery 2.  Aortogram 3.  Secondary cannulation, left lower extremity angiogram 4.  Mechanical thrombectomy using 7 French penumbra lightening system to the popliteal artery, anterior tibial artery, tibioperoneal trunk, peroneal artery 5.  Moderate sedation 82 minutes, contrast volume 130 mL  Antimicrobials:  Anti-infectives (From admission, onward)    None       Subjective: No new complaints today  Objective: Vitals:   06/03/22 0017 06/03/22 0422 06/03/22 0724 06/03/22 1105  BP: (!) 113/58 (!) 139/50 121/64 (!) 117/46  Pulse: (!) 56 (!) 58  75  Resp: '20 19 20 19  '$ Temp: 98.5 F (36.9 C) 98.1 F (36.7 C) 97.6 F (36.4 C) (!)  97.2 F (36.2 C)  TempSrc: Oral Oral Oral Oral  SpO2:    94%  Weight: 99.5 kg        Intake/Output Summary (Last 24 hours) at 06/03/2022 1337 Last data filed at 06/03/2022 0859 Gross per 24 hour  Intake 552.35 ml  Output 600 ml  Net -47.65 ml   Filed Weights   06/01/22 0023 06/02/22 0023 06/03/22 0017  Weight: 97.5 kg 98.3 kg 99.5 kg    Examination:  General exam: Appears calm and comfortable  Respiratory system: unlabored Cardiovascular system: irregularly irregular Gastrointestinal system: Abdomen is nondistended, soft and nontender.  Central nervous system: Alert and oriented. No focal neurological deficits. Extremities: no LEE    Data Reviewed: I have personally reviewed following labs and imaging studies  CBC: Recent Labs  Lab 06/01/22 0043 06/02/22 0803 06/02/22 1607 06/03/22 0704  WBC 15.3* 11.6*  --  13.1*  HGB 13.7 13.1 12.9 12.0  HCT 40.7 40.7 38.0 35.5*  MCV 91.9 94.4  --  92.2  PLT PLATELET CLUMPS NOTED ON SMEAR, UNABLE TO ESTIMATE 217  --  [id number]    Basic Metabolic Panel: Recent Labs  Lab 06/01/22 0043 06/02/22 0836 06/02/22 1607 06/03/22 0703  NA 131* 134* 123* 134*  K 4.4 4.5 3.8 4.6  CL 100 102 90* 102  CO2 20* 18*  --  19*  GLUCOSE 96 91 98 105*  BUN 7* '9 9 11  '$ CREATININE 0.83 0.94 0.50 0.80  CALCIUM 8.4* 8.7*  --  8.3*    GFR: Estimated Creatinine Clearance: 61.4 mL/min (by C-G formula based on SCr of 0.8 mg/dL).  Liver Function Tests: No results for input(s): "AST", "ALT", "ALKPHOS", "BILITOT", "PROT", "ALBUMIN" in the last 168 hours.  CBG: Recent Labs  Lab 06/02/22 1116  GLUCAP 112*     No results found for this or any previous visit (from the past 240 hour(s)).       Radiology Studies: CT HEAD WO CONTRAST (5MM)  Result Date: 06/03/2022 CLINICAL DATA:  Remote subdural hematoma, need to start anticoagulation EXAM: CT HEAD WITHOUT CONTRAST TECHNIQUE: Contiguous axial images were obtained from the base of the skull through the vertex without intravenous contrast. RADIATION DOSE REDUCTION: This exam was  performed according to the departmental dose-optimization program which includes automated exposure control, adjustment of the mA and/or kV according to patient size and/or use of iterative reconstruction technique. COMPARISON:  11/21/2021 FINDINGS: Brain: Ill-defined hypodensity in the right frontotemporal region appears new from the prior exam (series 3, image 15), with some loss of gray-white differentiation in this area, and possible hypodensity in the right basal ganglia. No evidence of acute hemorrhage, mass, mass effect, or midline shift. No hydrocephalus or extra-axial fluid collection. Periventricular white matter changes, likely the sequela of chronic small vessel ischemic disease. Megan: No hyperdense vessel. Skull: Negative for fracture or focal lesion. Sinuses/Orbits: No acute finding. Other: The mastoid air cells are well aerated. IMPRESSION: Ill-defined hypodensity in the right frontotemporal region, with some loss of gray-white differentiation in this area, and possible hypodensity in the right basal ganglia, concerning for acute infarct. MRI without contrast is recommended for further evaluation. These results were called by telephone at the time of interpretation on 06/03/2022 at 3:23 am to provider OPYD , who verbally acknowledged these results. Electronically Signed   By: Merilyn Baba M.D.   On: 06/03/2022 03:24   PERIPHERAL Megan CATHETERIZATION  Result Date: 06/02/2022 Images from the original result were not included. Patient name: ARIONA KULMAN  MRN: [id number] DOB: March 10, 1946 Sex: female 06/02/2022 Pre-operative Diagnosis: Left lower extremity acute limb ischemia [place] 1 Post-operative diagnosis:  Same Surgeon:  Broadus John, MD Procedure Performed: 1.  Ultrasound-guided micropuncture access of the right common femoral artery 2.  Aortogram 3.  Secondary cannulation, left lower extremity angiogram 4.  Mechanical thrombectomy using 7 French penumbra lightening system of the  popliteal artery, anterior tibial artery, tibioperoneal trunk, peroneal artery. 5.  Moderate sedation 82 minutes, contrast volume 130 mL Indications: Patient is Javarius Tsosie 77 year old female who presented with new onset left lower extremity pain.  CT scan demonstrated embolus in the left popliteal artery extending into the tibioperoneal trunk.  No flow distally.  Jaileen had rest pain, no sensory or motor deficits.  She was Delmer Kowalski poor surgical candidate.  After discussing the risk and benefits of left lower extremity angiogram with percutaneous mechanical thrombectomy, Edie elected to proceed. Etiology believed to be Yolando Gillum-fib embolus after discontinuing anticoagulation due to fall with head bleed last year. Findings: No flow-limiting stenosis appreciated in the aortoiliac segments bilaterally On the left, widely patent common femoral artery, SFA, profunda.  The P2 segment of the popliteal artery demonstrates Nickoli Bagheri flush occlusion with no named outflow vessels.  The anterior tibial artery reconstituted at the mid tibia and the posterior tibial artery appears to reconstitute via Zacarias Krauter series of collaterals at the distal tibia.  Procedure:  The patient was identified in the holding area and taken to room 8.  The patient was then placed supine on the table and prepped and draped in the usual sterile fashion.  Alliah Boulanger time out was called.  Ultrasound was used to evaluate the right common femoral artery.  It was patent .  [place] digital ultrasound image was acquired.  Ernisha Sorn micropuncture needle was used to access the right common femoral artery under ultrasound guidance.  An 018 wire was advanced without resistance and Tiann Saha micropuncture sheath was placed.  The 018 wire was removed and Carlea Badour benson wire was placed.  The micropuncture sheath was exchanged for Kynadie Yaun 5 french sheath.  An omniflush catheter was advanced over the wire to the level of L-1.  An abdominal angiogram was obtained.  Next, using the omniflush catheter and Floride Hutmacher benson wire, the aortic bifurcation was  crossed and the catheter was placed into theleft external iliac artery and left runoff was obtained. I elected to intervene on the left popliteal occlusion.  The patient was heparinized with 8000 units IV heparin and Benisha Hadaway 7 [place] by 60 cm sheath was brought onto the field.  There was some difficulty bringing the sheath up and over the aortic bifurcation, however was able to do so with the use of an Amplatz wire.  This wire was then switched out and Ziv Welchel series of wires and catheters were used to traverse the popliteal artery lesion.  The wire preferentially chose the anterior tibial artery, therefore I elected to begin percutaneous mechanical thrombectomy from the P2 segment of the popliteal artery into the anterior tibial artery using the 7 French penumbra lightening system.  This took Auden Tatar considerable mount time, however I was able to retrieve what appeared to be Zamzam Whinery-fib embolus which had an acute component on the more proximal portion of the embolus. Once the anterior tibial artery was recanalized, I moved to the tibioperoneal trunk.  I was able to pass wire into the peroneal artery, and suction thrombectomy of the tibioperoneal trunk followed into the peroneal artery.  Follow-up angiography demonstrated excellent result with reconstitution of the TPT  trunk and peroneal artery.  I could not find the ostia of the posterior tibial artery, and therefore elected to terminate the case. The right-sided access site was closed with the use of Patryk Conant ProGlide device without issue.  I was aware that another device would result in failure, especially due to the patient's morbid obesity. Being that this is an Maiko Salais-fib embolus, will need to discuss therapeutic anticoagulation.  I realize this was discontinued due to Magaby Rumberger fall, however risk and benefits discussion needs to occur. Will heparinize at the 8-hour mark, and keep bedrest. Impression: Successful mechanical thrombectomy of the popliteal artery, anterior tibial artery, tibioperoneal  trunk, peroneal artery. [name], MD [facility] Office: [phone]        Scheduled Meds:  atorvastatin  40 mg Oral Daily   clopidogrel  75 mg Oral Daily   diltiazem  240 mg Oral Daily   levETIRAcetam  750 mg Oral BID   levothyroxine  150 mcg Oral Q0600   metoprolol tartrate  100 mg Oral BID   pantoprazole  40 mg Oral Q1200   senna-docusate  1 tablet Oral BID   sodium chloride flush  3 mL Intravenous Q12H   Continuous Infusions:  sodium chloride     heparin 1,050 Units/hr (06/02/22 2313)     LOS: 3 days    Time spent: over 30 min    Fayrene Helper, MD [facility]   To contact the attending provider between 7A-7P or the covering provider during after hours 7P-7A, please log into the web site [email or web address] and access using universal [place] password for that web site. If you do not have the password, please call the [name] operator.  06/03/2022, 1:37 PM

## 2022-06-03 NOTE — Progress Notes (Signed)
ANTICOAGULATION CONSULT NOTE  Pharmacy Consult for heparin Indication:  arterial thrombus  No Known Allergies  Patient Measurements: Weight: 99.5 kg (219 lb 5.7 oz) Heparin Dosing Weight: 68kg  Vital Signs: Temp: 97.2 F (36.2 C) (03/07 1105) Temp Source: Oral (03/07 1105) BP: 117/46 (03/07 1105) Pulse Rate: 75 (03/07 1105)  Labs: Recent Labs    06/01/22 0043 06/02/22 0803 06/02/22 0836 06/02/22 1607 06/03/22 0703 06/03/22 0704 06/03/22 0754  HGB 13.7 13.1  --  12.9  --  12.0  --   HCT 40.7 40.7  --  38.0  --  35.5*  --   PLT PLATELET CLUMPS NOTED ON SMEAR, UNABLE TO ESTIMATE 217  --   --   --  301  --   HEPARINUNFRC  --   --   --   --   --   --  0.36  CREATININE 0.83  --  0.94 0.50 0.80  --   --      Estimated Creatinine Clearance: 61.4 mL/min (by C-G formula based on SCr of 0.8 mg/dL).   Medical History: Past Medical History:  Diagnosis Date   Acute diastolic heart failure (HCC)    Anxiety state, unspecified    Asthma    Atrial fibrillation (HCC)    Congestive heart failure, unspecified    Degenerative joint disease    GERD (gastroesophageal reflux disease)    Hyperlipidemia    Hypertension    Lymphedema    Obesity    OSA (obstructive sleep apnea) 06/09/2015   Persistent atrial fibrillation (HCC)     post-termination pauses with afib s/p PPM   Presence of permanent cardiac pacemaker    Sinoatrial node dysfunction (HCC)    s/p PPM   Sleep apnea    compliant with CPAP   Unspecified hypothyroidism    Unspecified venous (peripheral) insufficiency     Medications:  Medications Prior to Admission  Medication Sig Dispense Refill Last Dose   albuterol (VENTOLIN HFA) 108 (90 Base) MCG/ACT inhaler INHALE 2 PUFFS BY MOUTH EVERY 6 HOURS AS NEEDED FOR WHEEZING OR SHORTNESS OF BREATH Strength 108 (90 Base) MCG/ACT 18 g 0 unk   clotrimazole-betamethasone (LOTRISONE) cream APPLY  CREAM TOPICALLY TO AFFECTED AREA TWICE DAILY (Patient taking differently: Apply 1  application  topically 2 (two) times daily.) 45 g 0 unk   diclofenac Sodium (VOLTAREN) 1 % GEL Apply 4 g topically in the morning, at noon, and at bedtime.   unk   diltiazem (CARDIZEM CD) 240 MG 24 hr capsule Take 1 capsule (240 mg total) by mouth daily. 90 capsule 2 Past Week   levothyroxine (SYNTHROID) 150 MCG tablet Take 1 tablet (150 mcg total) by mouth daily. 30 tablet 11 Past Week   metoprolol tartrate (LOPRESSOR) 100 MG tablet Take 1 tablet by mouth twice daily 60 tablet 2 Past Week   Vitamin D, Ergocalciferol, (DRISDOL) 1.25 MG (50000 UNIT) CAPS capsule Take 1 capsule by mouth once a week (Patient taking differently: Take 50,000 Units by mouth every 7 (seven) days.) 12 capsule 1 Past Month   atorvastatin (LIPITOR) 80 MG tablet Take 1 tablet by mouth once daily (Patient not taking: Reported on 02/01/2022) 90 tablet 1    blood glucose meter kit and supplies Dispense based on patient and insurance preference. Use up to four times daily as directed. (FOR ICD-10 E10.9, E11.9). 1 each 0    esomeprazole (NEXIUM) 40 MG capsule Take 1 capsule (40 mg total) by mouth daily. (Patient not taking: Reported on  06/01/2022) 90 capsule 1 Not Taking   ipratropium-albuterol (DUONEB) 0.5-2.5 (3) MG/3ML SOLN Take 3 mLs by nebulization in the morning and at bedtime. (Patient not taking: Reported on 06/01/2022) 360 mL 0 Not Taking   levETIRAcetam (KEPPRA) 750 MG tablet Take 750 mg by mouth in the morning and at bedtime. (Patient not taking: Reported on 06/01/2022)   Not Taking   nystatin (MYCOSTATIN) 100000 UNIT/ML suspension Take 5 mLs (500,000 Units total) by mouth 4 (four) times daily. Swish and swallow (Patient not taking: Reported on 06/01/2022) 60 mL 0 Not Taking   Scheduled:   atorvastatin  40 mg Oral Daily   clopidogrel  75 mg Oral Daily   diltiazem  240 mg Oral Daily   furosemide  20 mg Oral Daily   levETIRAcetam  750 mg Oral BID   levothyroxine  150 mcg Oral Q0600   metoprolol tartrate  100 mg Oral BID    pantoprazole  40 mg Oral Q1200   senna-docusate  1 tablet Oral BID   sodium chloride flush  3 mL Intravenous Q12H   Infusions:   sodium chloride     heparin 1,050 Units/hr (06/02/22 2313)    Assessment: Pt used to be on coumadin for her AF but it was discontinued due to fall risk. She is s/p thrombectomy. Pharmacy consulted to start heparin for arterial embolus.  Heparin level is therapeutic at 0.36. No bleeding or infusion issues per RN. Hgb/Plt wnl.  Of note, patient also started on Plavix.     Goal of Therapy:  Heparin level 0.3-0.7 units/ml Monitor platelets by anticoagulation protocol: Yes   Plan:  Continue heparin infusion at 1050 units/hr Check 8 hr HL then daily F/u plan for PO AC and Plavix   Thank you for allowing [place] to participate in this patients care. Jens Som, PharmD 06/03/2022 2:29 PM  **Pharmacist phone directory can be found on [email or web address] listed under [place]**

## 2022-06-03 NOTE — Discharge Instructions (Signed)
  [facility] [place]  Discharge Instructions  Lower Extremity Angiogram; Angioplasty/Stenting  Please refer to the following instructions for your post-procedure care. Your surgeon or physician assistant will discuss any changes with you.  Activity  Avoid lifting more than 8 pounds (1 gallons of milk) for 5 days after your procedure. You may walk as much as you can tolerate. It's OK to drive after 72 hours.  Bathing/Showering  You may shower the day after your procedure. If you have a bandage, you may remove it at 24- 48 hours. Clean your incision site with mild soap and water. Pat the area dry with a clean towel.  Diet  Resume your pre-procedure diet. There are no special food restrictions following this procedure. All patients with peripheral vascular disease should follow a low fat/low cholesterol diet. In order to heal from your surgery, it is CRITICAL to get adequate nutrition. Your body requires vitamins, minerals, and protein. Vegetables are the best source of vitamins and minerals. Vegetables also provide the perfect balance of protein. Processed food has little nutritional value, so try to avoid this.  Medications  Resume taking all of your medications unless your doctor tells you not to. If your incision is causing pain, you may take over-the-counter pain relievers such as acetaminophen (Tylenol)  Follow Up  Follow up will be arranged at the time of your procedure. You may have an office visit scheduled or may be scheduled for surgery. Ask your surgeon if you have any questions.  Please call us immediately for any of the following conditions: .Severe or worsening pain your legs or feet at rest or with walking. .Increased pain, redness, drainage at your groin puncture site. .Fever of 101 degrees or higher. .If you have any mild or slow bleeding from your puncture site: lie down, apply firm constant pressure over the area with a piece of gauze or a  clean wash cloth for 30 minutes- no peeking!, call 911 right away if you are still bleeding after 30 minutes, or if the bleeding is heavy and unmanageable.  Reduce your risk factors of vascular disease:  . Stop smoking. If you would like help call [facility] at 1-800-QUIT-NOW (1-800-784-8669) or [place] at 336-586-4000. . Manage your cholesterol . Maintain a desired weight . Control your diabetes . Keep your blood pressure down .  If you have any questions, please call the office at [phone] 

## 2022-06-03 NOTE — Progress Notes (Addendum)
  Progress Note    06/03/2022 7:22 AM 1 Day Post-Op  Subjective:  says her foot feels better   Vitals:   06/03/22 0017 06/03/22 0422  BP: (!) 113/58 (!) 139/50  Pulse: (!) 56 (!) 58  Resp: 20 19  Temp: 98.5 F (36.9 C) 98.1 F (36.7 C)  SpO2:     Physical Exam: Cardiac:  regular Lungs:  non labored Extremities:  rigth CF access site c/d/I without swelling or hematoma. Doppler right Dp. Doppler left DP/Peroneal signals. Feet warm and well perfused Abdomen:  obese, soft Neurologic: alert and oriented  CBC    Component Value Date/Time   WBC 11.6 (H) 06/02/2022 0803   RBC 4.31 06/02/2022 0803   HGB 12.9 06/02/2022 1607   HGB 13.6 09/10/2021 1529   HCT 38.0 06/02/2022 1607   HCT 41.0 09/10/2021 1529   PLT 217 06/02/2022 0803   PLT 203 09/10/2021 1529   MCV 94.4 06/02/2022 0803   MCV 96 09/10/2021 1529   MCH 30.4 06/02/2022 0803   MCHC 32.2 06/02/2022 0803   RDW 15.4 06/02/2022 0803   RDW 13.9 09/10/2021 1529   LYMPHSABS 1.0 09/10/2021 1529   MONOABS 1.0 06/12/2021 0746   EOSABS 0.1 09/10/2021 1529   BASOSABS 0.1 09/10/2021 1529    BMET    Component Value Date/Time   NA 123 (L) 06/02/2022 1607   NA 140 09/10/2021 1529   K 3.8 06/02/2022 1607   CL 90 (L) 06/02/2022 1607   CO2 18 (L) 06/02/2022 0836   GLUCOSE 98 06/02/2022 1607   BUN 9 06/02/2022 1607   BUN 8 09/10/2021 1529   CREATININE 0.50 06/02/2022 1607   CALCIUM 8.7 (L) 06/02/2022 0836   GFRNONAA >60 06/02/2022 0836   GFRAA 62 02/12/2020 0933    INR    Component Value Date/Time   INR 3.1 (A) 08/03/2021 1449   INR 2.2 (H) 06/13/2021 0534   INR 2.7 05/22/2010 1333     Intake/Output Summary (Last 24 hours) at 06/03/2022 [id number] Last data filed at 06/03/2022 0425 Gross per 24 hour  Intake 412.35 ml  Output 400 ml  Net 12.35 ml     Assessment/Plan:  77 y.o. female is s/p aortogram, arteriogram LLE with mechanical thrombectomy of popliteal, AT, TPT and peroneal  1 Day Post-Op   LLE well perfused  and warm with Doppler DP and Pero signals, Resolved rest pain in left foot Right common femoral access site c/d/I without swelling or hematoma On IV heparin. Apparently was off of anticoagulation previously due to fall risk but Dr. Virl Cagey to discuss ac going forward Continue Statin and Plavix Will arrange follow up in our office for her in 4 weeks with non invasive studies   Karoline Caldwell, PA-C [facility] [phone] 06/03/2022 7:22 AM  VASCULAR STAFF ADDENDUM: I have independently interviewed and examined the patient. I agree with the above.  Likley Afib embolus. AT signal triphasic, monophasic PT Needs anticoagulation to prevent another episode. Understand this is a risk - benefit discussion due to prior head bleed s/p fall.  Pt stated she was okay with AC this morning.   [name], MD [facility] Phone Number: [phone] 06/03/2022 7:38 AM

## 2022-06-03 NOTE — Care Management Important Message (Signed)
Important Message  Patient Details  Name: Megan [name] MRN: [id number] Date of Birth: 18-Mar-1946   Medicare Important Message Given:  Yes     Shelda Altes 06/03/2022, 7:58 AM

## 2022-06-03 NOTE — TOC Progression Note (Signed)
Transition of Care [facility]) - Progression Note    Patient Details  Name: Megan [name] MRN: [id number] Date of Birth: 31-Aug-1945  Transition of Care [facility]) CM/SW [place], LCSW Phone Number: 06/03/2022, 12:43 PM  Clinical Narrative:    CSW met with pt at bedside to deliver list of bed offers. Pt was having a difficult time to make a decision and CSW offered to return tomorrow. Pt stated that she would let CSW know tomorrow on which SNF she chose.   Pt did seem to be slightly confused when discussing STR at SNF wit CSW today. CSW explained to pt that she will stay at [facility] for a couple of weeks and then return home. Pt is still agreeable to plan and stated she feels safe/comfortable going back home.   TOC will continue to follow this admission.    Expected Discharge Plan: [place] Barriers to Discharge: Continued Medical Work up  Expected Discharge Plan and Services       Living arrangements for the past 2 months: Single Family Home                                       Social Determinants of Health (SDOH) Interventions SDOH Screenings   Food Insecurity: No Food Insecurity (06/01/2022)  Housing: Low Risk  (06/01/2022)  Transportation Needs: No Transportation Needs (06/01/2022)  Utilities: Not At Risk (06/01/2022)  Alcohol Screen: Low Risk  (12/14/2021)  Depression (PHQ2-9): Low Risk  (12/14/2021)  Recent Concern: Depression (PHQ2-9) - Medium Risk (10/22/2021)  Financial Resource Strain: Low Risk  (12/14/2021)  Physical Activity: Inactive (12/14/2021)  Social Connections: Socially Isolated (12/14/2021)  Stress: No Stress Concern Present (12/14/2021)  Tobacco Use: Medium Risk (06/02/2022)    Readmission Risk Interventions     No data to display        Beckey Rutter, MSW, LCSWA, LCASA Transitions of Care  Clinical Social Worker I

## 2022-06-03 NOTE — Consult Note (Signed)
NEUROLOGY CONSULTATION NOTE   Date of service: June 03, 2022 Patient Name: Megan [name] MRN:  [id number] DOB:  [birth year] Reason for consult: Subacute infarct on CT Requesting Provider: Elodia Florence., * _ _ _   _ __   _ __ _ _  __ __   _ __   __ _  History of Present Illness  Megan [name] is a 77 y.o. female with PMH significant for atrial fibrillation (not on anticoagulation due to history of multiple falls and subdural hematomas), second-degree AV block with non-MRI compatible pacemaker, hypertension, anxiety, hypothyroidism, COPD who was admitted to the hospital for 3/4 with complaints of left leg pain. Left lower extremity angiogram performed by vascular surgery 3/6: No flow-limiting stenosis appreciated.  CT brain was obtained before placing patient on Plavix as patient has history of SDHs: image showed area of acute hypodensity in the right MCA territory.  MRI unable to be completed due to patient's noncompatible pacemaker.  Neurology consulted for further recommendations.  On exam, patient is alert and oriented, with relatively mild left sided motor deficits seen.    ROS   Constitutional Denies weight loss, fever and chills.   HEENT Denies changes in vision and hearing.   Respiratory Denies SOB and cough.   CV Denies palpitations and CP   GI Denies abdominal pain, nausea, vomiting and diarrhea.   GU Denies dysuria and urinary frequency.   MSK Denies myalgia. + for pain in left upper arm and left leg  Skin Denies pruritus. + for rash to face  Neurological Denies headache and syncope.   Psychiatric Denies recent changes in mood. Denies anxiety and depression.    Past History   Past Medical History:  Diagnosis Date   Acute diastolic heart failure (HCC)    Anxiety state, unspecified    Asthma    Atrial fibrillation (HCC)    Congestive heart failure, unspecified    Degenerative joint disease    GERD (gastroesophageal reflux disease)    Hyperlipidemia     Hypertension    Lymphedema    Obesity    OSA (obstructive sleep apnea) 06/09/2015   Persistent atrial fibrillation (HCC)     post-termination pauses with afib s/p PPM   Presence of permanent cardiac pacemaker    Sinoatrial node dysfunction ([place])    s/p PPM   Sleep apnea    compliant with CPAP   Unspecified hypothyroidism    Unspecified venous (peripheral) insufficiency    Past Surgical History:  Procedure Laterality Date   ABDOMINAL AORTOGRAM W/LOWER EXTREMITY N/A 06/02/2022   Procedure: ABDOMINAL AORTOGRAM W/LOWER EXTREMITY;  Surgeon: Broadus John, MD;  Location: [place] [facility];  Service: Cardiovascular;  Laterality: N/A;   CATARACT EXTRACTION W/PHACO Left 09/09/2014   Procedure: CATARACT EXTRACTION PHACO AND INTRAOCULAR LENS PLACEMENT (IOC);  Surgeon: Tonny Branch, MD;  Location: [facility];  Service: Ophthalmology;  Laterality: Left;  CDE: 6.99   CATARACT EXTRACTION W/PHACO Right 10/07/2014   Procedure: CATARACT EXTRACTION PHACO AND INTRAOCULAR LENS PLACEMENT RIGHT EYE;  Surgeon: Tonny Branch, MD;  Location: [facility];  Service: Ophthalmology;  Laterality: Right;  CDE:5.16   PACEMAKER INSERTION     SJM by B[place]  06/02/2022   Procedure: PERIPHERAL VASCULAR THROMBECTOMY;  Surgeon: Broadus John, MD;  Location: [place] [facility];  Service: Cardiovascular;;   Family History  Problem Relation Age of Onset   Parkinson's disease Other    CVA Other  Parkinson's disease Father    Social History   Socioeconomic History   Marital status: Widowed    Spouse name: Not on file   Number of children: 1   Years of education: 12   Highest education level: High school graduate  Occupational History   Occupation: RETIRED    Employer: RETIRED  Tobacco Use   Smoking status: Former    Packs/day: 0.50    Years: 10.00    Total pack years: 5.00    Types: Cigarettes    Quit date: 03/29/1978    Years since quitting: 44.2    Passive exposure: Past   Smokeless  tobacco: Never  Vaping Use   Vaping Use: Never used  Substance and Sexual Activity   Alcohol use: No    Alcohol/week: 0.0 standard drinks of alcohol   Drug use: No   Sexual activity: Not Currently    Partners: Male    Birth control/protection: None  Other Topics Concern   Not on file  Social History Narrative   Her son lives with her and helps her a lot   She can barely walk   Social Determinants of Health   Financial Resource Strain: Low Risk  (12/14/2021)   Overall Financial Resource Strain (CARDIA)    Difficulty of Paying Living Expenses: Not hard at all  Food Insecurity: No Food Insecurity (06/01/2022)   Hunger Vital Sign    Worried About Running Out of Food in the Last Year: Never true    Ran Out of Food in the Last Year: Never true  Transportation Needs: No Transportation Needs (06/01/2022)   PRAPARE - Hydrologist (Medical): No    Lack of Transportation (Non-Medical): No  Physical Activity: Inactive (12/14/2021)   Exercise Vital Sign    Days of Exercise per Week: 0 days    Minutes of Exercise per Session: 0 min  Stress: No Stress Concern Present (12/14/2021)   [place]    Feeling of Stress : Only a little  Social Connections: Socially Isolated (12/14/2021)   Social Connection and Isolation Panel [NHANES]    Frequency of Communication with Friends and Family: More than three times a week    Frequency of Social Gatherings with Friends and Family: More than three times a week    Attends Religious Services: Never    Marine scientist or Organizations: No    Attends Archivist Meetings: Never    Marital Status: Widowed   No Known Allergies  Medications   Medications Prior to Admission  Medication Sig Dispense Refill Last Dose   albuterol (VENTOLIN HFA) 108 (90 Base) MCG/ACT inhaler INHALE 2 PUFFS BY MOUTH EVERY 6 HOURS AS NEEDED FOR WHEEZING OR SHORTNESS OF  BREATH Strength 108 (90 Base) MCG/ACT 18 g 0 unk   clotrimazole-betamethasone (LOTRISONE) cream APPLY  CREAM TOPICALLY TO AFFECTED AREA TWICE DAILY (Patient taking differently: Apply 1 application  topically 2 (two) times daily.) 45 g 0 unk   diclofenac Sodium (VOLTAREN) 1 % GEL Apply 4 g topically in the morning, at noon, and at bedtime.   unk   diltiazem (CARDIZEM CD) 240 MG 24 hr capsule Take 1 capsule (240 mg total) by mouth daily. 90 capsule 2 Past Week   levothyroxine (SYNTHROID) 150 MCG tablet Take 1 tablet (150 mcg total) by mouth daily. 30 tablet 11 Past Week   metoprolol tartrate (LOPRESSOR) 100 MG tablet Take 1 tablet by mouth twice  daily 60 tablet 2 Past Week   Vitamin D, Ergocalciferol, (DRISDOL) 1.25 MG (50000 UNIT) CAPS capsule Take 1 capsule by mouth once a week (Patient taking differently: Take 50,000 Units by mouth every 7 (seven) days.) 12 capsule 1 Past Month   atorvastatin (LIPITOR) 80 MG tablet Take 1 tablet by mouth once daily (Patient not taking: Reported on 02/01/2022) 90 tablet 1    blood glucose meter kit and supplies Dispense based on patient and insurance preference. Use up to four times daily as directed. (FOR ICD-10 E10.9, E11.9). 1 each 0    esomeprazole (NEXIUM) 40 MG capsule Take 1 capsule (40 mg total) by mouth daily. (Patient not taking: Reported on 06/01/2022) 90 capsule 1 Not Taking   ipratropium-albuterol (DUONEB) 0.5-2.5 (3) MG/3ML SOLN Take 3 mLs by nebulization in the morning and at bedtime. (Patient not taking: Reported on 06/01/2022) 360 mL 0 Not Taking   levETIRAcetam (KEPPRA) 750 MG tablet Take 750 mg by mouth in the morning and at bedtime. (Patient not taking: Reported on 06/01/2022)   Not Taking   nystatin (MYCOSTATIN) 100000 UNIT/ML suspension Take 5 mLs (500,000 Units total) by mouth 4 (four) times daily. Swish and swallow (Patient not taking: Reported on 06/01/2022) 60 mL 0 Not Taking     Vitals   Vitals:   06/03/22 0017 06/03/22 0422 06/03/22 0724 06/03/22  1105  BP: (!) 113/58 (!) 139/50 121/64 (!) 117/46  Pulse: (!) 56 (!) 58  75  Resp: '20 19 20 19  '$ Temp: 98.5 F (36.9 C) 98.1 F (36.7 C) 97.6 F (36.4 C) (!) 97.2 F (36.2 C)  TempSrc: Oral Oral Oral Oral  SpO2:    94%  Weight: 99.5 kg        Body mass index is 42.84 kg/m.  Physical Exam   General: Laying comfortably in bed; in no acute distress.  HENT: Normal oropharynx and mucosa. Red rash on face, which patient sates is her baseline. Neck: Supple, no pain or tenderness  CV: No JVD. No peripheral edema.  Pulmonary: Symmetric Chest rise. Normal respiratory effort.  Abdomen: Soft to touch, non-tender.  Ext: No cyanosis or deformity. Obese/Edema bilateral lower extremities.  Skin: Rash to face (baseline). Dry skin, no turgor.    Musculoskeletal: Normal digits and nails by inspection.   Neurologic Examination  Mental status/Cognition: Alert, oriented to self, place, month and year, good attention.  Speech/language: Fluent, comprehension intact, object naming intact, repetition intact.  Cranial nerves:   CN II Pupils equal and reactive to light, no VF deficits    CN III,IV,VI EOM intact, no gaze preference or deviation, no nystagmus    CN V normal sensation in V1, V2, and V3 segments bilaterally    CN VII no asymmetry   CN VIII normal hearing to speech    CN IX & X normal palatal elevation, no uvular deviation    CN XI 5/5 head turn and 5/5 shoulder shrug bilaterally    CN XII midline tongue protrusion   Motor:  No drift present.  RUE: 4+/5, 4/5 grip RLE: 4-/5,  4/5 plantar/dorsi flexion LUE: 4+/5, 4/5 grip LLE: 4-/5, 4-/5 dorsi/plantar flexion  With eyes open, negative orbiting fingers test. With eyes closed, orbiting fingers test is positive on the left.  Left pronator drift with testing for Barre sign.  Sensation: Intact to light touch, but with extinction on the left with DSS Coordination/Complex Motor:  Finger to Nose: slow, but without ataxia. She does have  difficulty targeting with her  LUE.  Gait: deferred  Labs   CBC:  Recent Labs  Lab 06/02/22 0803 06/02/22 1607 06/03/22 0704  WBC 11.6*  --  13.1*  HGB 13.1 12.9 12.0  HCT 40.7 38.0 35.5*  MCV 94.4  --  92.2  PLT 217  --  [id number]    Basic Metabolic Panel:  Lab Results  Component Value Date   NA 134 (L) 06/03/2022   K 4.6 06/03/2022   CO2 19 (L) 06/03/2022   GLUCOSE 105 (H) 06/03/2022   BUN 11 06/03/2022   CREATININE 0.80 06/03/2022   CALCIUM 8.3 (L) 06/03/2022   GFRNONAA >60 06/03/2022   GFRAA 62 02/12/2020   Lipid Panel:  Lab Results  Component Value Date   LDLCALC 145 (H) 06/03/2022   HgbA1c:  Lab Results  Component Value Date   HGBA1C 6.2 (H) 05/19/2021   Urine Drug Screen: No results found for: "LABOPIA", "COCAINSCRNUR", "LABBENZ", "AMPHETMU", "THCU", "LABBARB"  Alcohol Level No results found for: "ETH"  CT Head without contrast(Personally reviewed): Ill-defined hypodensity in the right frontotemporal region, with some loss of gray-white differentiation in this area, and possible hypodensity in the right basal ganglia, concerning for acute infarct.   Assessment  77 y.o. female with PMH significant for significant for atrial fibrillation (not on anticoagulation due to history of multiple falls and subdural hematomas), second-degree AV block with non-MRI compatible pacemaker, hypertension, anxiety, hypothyroidism, COPD who was admitted to the hospital for 3/4 with complaints of left leg pain. Neurology was consulted after CT head showed a new region of hypodensity in the right MCA territory, suggestive of subacute ischemic infarction. - Her neurologic examination is notable for left upper and lower extremity UMN weakness as well as extinction to DSS on the left.  - Exam correlates with the location of the subacute infarct seen on CT.  - Unable to obtain MRI brain due to MRI-incompatible pacemaker  - Primary Neurological Diagnosis: Acute Cerebral Infarction, left  MCA territory  - Etiology: Most likely cardioembolic d/t Atrial Fibrillation, not on [place] - Stroke risk factors: Atrial Fibrillation (not on [place]), CHF, HLD, HTN, OSA  Recommendations  - Frequent Neuro checks per stroke unit protocol - MRI Brain unable to be performed due to non-comp PPM - Vascular imaging - CTA of head and neck - TTE w/bubble  - LUE DVT with reported pain in upper arm - Lipid panel - Statin - Patient takes Lipitor '80mg'$  at home - A1C - Recommend ASA, Plavix x 21 days, then Plavix alone, but will also need to account for Vascular Surgery indications. Unable to anticoagulate for her atrial fibrillation over the long term due to falls risk  - DVT ppx - currently on heparin gtt - Smoking cessation - patient is a former smoker - SBP goal - <220 x48hours, PRN labetalol if HR>60 and PRN Hydralazine if HR<60 - Telemetry monitoring for arrhythmia - 72h - Swallow screen - will be performed prior to PO intake - Stroke education - will be given - PT/OT/SLP - BP management per standard protocol. Most likely out of the permissive HTN time window as the infarct on CT appears subacute and most likely occurred > 24 hours ago.  - Stroke team will continue to follow.   I have seen and examined the patient. I have formulated the assessment and recommendations. 77 year old female with subacute right MCA territory infarction on CT head. Exam reveals correlating left sided UMN signs and extinction to DSS on the left. Recommendations as above.  Electronically signed: Dr. Kerney Elbe

## 2022-06-03 NOTE — Plan of Care (Signed)
  Problem: Education: Goal: Knowledge of General Education information will improve Description: Including pain rating scale, medication(s)/side effects and non-pharmacologic comfort measures Outcome: Progressing   Problem: Clinical Measurements: Goal: Will remain free from infection Outcome: Progressing   Problem: Safety: Goal: Ability to remain free from injury will improve Outcome: Progressing   

## 2022-06-03 NOTE — Progress Notes (Signed)
Carotid duplex bilateral study completed.   Please see CV Proc for preliminary results.   Teighan Aubert, RDMS, RVT  

## 2022-06-04 ENCOUNTER — Other Ambulatory Visit (HOSPITAL_COMMUNITY): Payer: Self-pay

## 2022-06-04 ENCOUNTER — Inpatient Hospital Stay (HOSPITAL_COMMUNITY): Payer: Medicare Other

## 2022-06-04 DIAGNOSIS — I63411 Cerebral infarction due to embolism of right middle cerebral artery: Secondary | ICD-10-CM

## 2022-06-04 DIAGNOSIS — I998 Other disorder of circulatory system: Secondary | ICD-10-CM

## 2022-06-04 DIAGNOSIS — Z87828 Personal history of other (healed) physical injury and trauma: Secondary | ICD-10-CM

## 2022-06-04 DIAGNOSIS — I482 Chronic atrial fibrillation, unspecified: Secondary | ICD-10-CM

## 2022-06-04 LAB — COMPREHENSIVE METABOLIC PANEL
ALT: 21 U/L (ref 0–44)
AST: 34 U/L (ref 15–41)
Albumin: 2.3 g/dL — ABNORMAL LOW (ref 3.5–5.0)
Alkaline Phosphatase: 126 U/L (ref 38–126)
Anion gap: 11 (ref 5–15)
BUN: 9 mg/dL (ref 8–23)
CO2: 19 mmol/L — ABNORMAL LOW (ref 22–32)
Calcium: 8.2 mg/dL — ABNORMAL LOW (ref 8.9–10.3)
Chloride: 102 mmol/L (ref 98–111)
Creatinine, Ser: 1.02 mg/dL — ABNORMAL HIGH (ref 0.44–1.00)
GFR, Estimated: 56 mL/min — ABNORMAL LOW (ref >60)
Glucose, Bld: 92 mg/dL (ref 70–99)
Potassium: 4.3 mmol/L (ref 3.5–5.1)
Sodium: 132 mmol/L — ABNORMAL LOW (ref 135–145)
Total Bilirubin: 1.3 mg/dL — ABNORMAL HIGH (ref 0.3–1.2)
Total Protein: 5.2 g/dL — ABNORMAL LOW (ref 6.5–8.1)

## 2022-06-04 LAB — LIPID PANEL
Cholesterol: 209 mg/dL — ABNORMAL HIGH (ref 0–200)
HDL: 48 mg/dL (ref >40)
LDL Cholesterol: 135 mg/dL — ABNORMAL HIGH (ref 0–99)
Total CHOL/HDL Ratio: 4.4 RATIO
Triglycerides: 131 mg/dL (ref <150)
VLDL: 26 mg/dL (ref 0–40)

## 2022-06-04 LAB — HEMOGLOBIN A1C
Hgb A1c MFr Bld: 5.5 % (ref 4.8–5.6)
Mean Plasma Glucose: 111 mg/dL

## 2022-06-04 LAB — CBC
HCT: 32.9 % — ABNORMAL LOW (ref 36.0–46.0)
Hemoglobin: 11.3 g/dL — ABNORMAL LOW (ref 12.0–15.0)
MCH: 31.4 pg (ref 26.0–34.0)
MCHC: 34.3 g/dL (ref 30.0–36.0)
MCV: 91.4 fL (ref 80.0–100.0)
Platelets: 265 10*3/uL (ref 150–400)
RBC: 3.6 MIL/uL — ABNORMAL LOW (ref 3.87–5.11)
RDW: 15.5 % (ref 11.5–15.5)
WBC: 11.8 10*3/uL — ABNORMAL HIGH (ref 4.0–10.5)
nRBC: 0 % (ref 0.0–0.2)

## 2022-06-04 LAB — HEPARIN LEVEL (UNFRACTIONATED): Heparin Unfractionated: 0.5 IU/mL (ref 0.30–0.70)

## 2022-06-04 MED ORDER — APIXABAN 5 MG PO TABS
5.0000 mg | ORAL_TABLET | Freq: Two times a day (BID) | ORAL | Status: DC
  Administered 2022-06-04 – 2022-06-05 (×2): 5 mg via ORAL
  Filled 2022-06-04 (×2): qty 1

## 2022-06-04 MED ORDER — IOHEXOL 350 MG/ML SOLN
75.0000 mL | Freq: Once | INTRAVENOUS | Status: AC | PRN
  Administered 2022-06-04: 75 mL via INTRAVENOUS

## 2022-06-04 MED ORDER — ATORVASTATIN CALCIUM 80 MG PO TABS
80.0000 mg | ORAL_TABLET | Freq: Every day | ORAL | Status: DC
  Administered 2022-06-04 – 2022-06-05 (×2): 80 mg via ORAL
  Filled 2022-06-04 (×2): qty 1

## 2022-06-04 NOTE — Plan of Care (Signed)

## 2022-06-04 NOTE — Progress Notes (Signed)
[facility] for heparin>>apixaban Indication:  arterial thrombus  No Known Allergies  Patient Measurements: Weight: 101.4 kg (223 lb 8.7 oz) Heparin Dosing Weight: 68kg  Vital Signs: Temp: 97.6 F (36.4 C) (03/08 0735) Temp Source: Oral (03/08 0735) BP: 108/55 (03/08 0821) Pulse Rate: 86 (03/08 0735)  Labs: Recent Labs    06/02/22 0803 06/02/22 0836 06/02/22 1607 06/03/22 0703 06/03/22 0704 06/03/22 0754 06/04/22 0059  HGB 13.1  --  12.9  --  12.0  --  11.3*  HCT 40.7  --  38.0  --  35.5*  --  32.9*  PLT 217  --   --   --  301  --  265  HEPARINUNFRC  --   --   --   --   --  0.36 0.50  CREATININE  --    < > 0.50 0.80  --   --  1.02*   < > = values in this interval not displayed.     Estimated Creatinine Clearance: 48.7 mL/min (A) (by C-G formula based on SCr of 1.02 mg/dL (H)).   Medical History: Past Medical History:  Diagnosis Date   Acute diastolic heart failure (HCC)    Anxiety state, unspecified    Asthma    Atrial fibrillation (HCC)    Congestive heart failure, unspecified    Degenerative joint disease    GERD (gastroesophageal reflux disease)    Hyperlipidemia    Hypertension    Lymphedema    Obesity    OSA (obstructive sleep apnea) 06/09/2015   Persistent atrial fibrillation (HCC)     post-termination pauses with afib s/p PPM   Presence of permanent cardiac pacemaker    Sinoatrial node dysfunction (HCC)    s/p PPM   Sleep apnea    compliant with CPAP   Unspecified hypothyroidism    Unspecified venous (peripheral) insufficiency     Medications:  Medications Prior to Admission  Medication Sig Dispense Refill Last Dose   albuterol (VENTOLIN HFA) 108 (90 Base) MCG/ACT inhaler INHALE 2 PUFFS BY MOUTH EVERY 6 HOURS AS NEEDED FOR WHEEZING OR SHORTNESS OF BREATH Strength 108 (90 Base) MCG/ACT 18 g 0 unk   clotrimazole-betamethasone (LOTRISONE) cream APPLY  CREAM TOPICALLY TO AFFECTED AREA TWICE DAILY (Patient  taking differently: Apply 1 application  topically 2 (two) times daily.) 45 g 0 unk   diclofenac Sodium (VOLTAREN) 1 % GEL Apply 4 g topically in the morning, at noon, and at bedtime.   unk   diltiazem (CARDIZEM CD) 240 MG 24 hr capsule Take 1 capsule (240 mg total) by mouth daily. 90 capsule 2 Past Week   levothyroxine (SYNTHROID) 150 MCG tablet Take 1 tablet (150 mcg total) by mouth daily. 30 tablet 11 Past Week   metoprolol tartrate (LOPRESSOR) 100 MG tablet Take 1 tablet by mouth twice daily 60 tablet 2 Past Week   Vitamin D, Ergocalciferol, (DRISDOL) 1.25 MG (50000 UNIT) CAPS capsule Take 1 capsule by mouth once a week (Patient taking differently: Take 50,000 Units by mouth every 7 (seven) days.) 12 capsule 1 Past Month   atorvastatin (LIPITOR) 80 MG tablet Take 1 tablet by mouth once daily (Patient not taking: Reported on 02/01/2022) 90 tablet 1    blood glucose meter kit and supplies Dispense based on patient and insurance preference. Use up to four times daily as directed. (FOR ICD-10 E10.9, E11.9). 1 each 0    esomeprazole (NEXIUM) 40 MG capsule Take 1 capsule (40 mg total) by  mouth daily. (Patient not taking: Reported on 06/01/2022) 90 capsule 1 Not Taking   ipratropium-albuterol (DUONEB) 0.5-2.5 (3) MG/3ML SOLN Take 3 mLs by nebulization in the morning and at bedtime. (Patient not taking: Reported on 06/01/2022) 360 mL 0 Not Taking   levETIRAcetam (KEPPRA) 750 MG tablet Take 750 mg by mouth in the morning and at bedtime. (Patient not taking: Reported on 06/01/2022)   Not Taking   nystatin (MYCOSTATIN) 100000 UNIT/ML suspension Take 5 mLs (500,000 Units total) by mouth 4 (four) times daily. Swish and swallow (Patient not taking: Reported on 06/01/2022) 60 mL 0 Not Taking   Scheduled:   apixaban  5 mg Oral BID   atorvastatin  80 mg Oral Daily   clopidogrel  75 mg Oral Daily   diltiazem  240 mg Oral Daily   furosemide  20 mg Oral Daily   levETIRAcetam  750 mg Oral BID   levothyroxine  150 mcg Oral  Q0600   metoprolol tartrate  100 mg Oral BID   pantoprazole  40 mg Oral Q1200   senna-docusate  1 tablet Oral BID   sodium chloride flush  3 mL Intravenous Q12H   Infusions:   sodium chloride      Assessment: Pt used to be on coumadin for her AF but it was discontinued due to fall risk. She is s/p thrombectomy. Pharmacy consulted to start heparin for arterial embolus.  Heparin level is therapeutic at 0.5, on heparin infusion at 1050 units/hr. Hgb 11.3, plt 285. No s/sx of bleeding or infusion issues. Of note, patient also started on Plavix. Of note. Found to have subacute ischemic infarction - neurology following.    Plan to discharge to SNF. Transition to apixaban today.  Age<80, wt>60kg, scr<1.5 Goal of Therapy:  Monitor platelets by anticoagulation protocol: Yes   Plan:  Dc heparin Apixaban '5mg'$  PO BID  Onnie Boer, PharmD, [place], AAHIVP, CPP Infectious Disease Pharmacist 06/04/2022 4:42 PM

## 2022-06-04 NOTE — Progress Notes (Addendum)
  Progress Note    06/04/2022 7:52 AM 2 Days Post-Op  Subjective:  resting comfortably. No complaints    Vitals:   06/04/22 0529 06/04/22 0735  BP: (!) 114/59 116/60  Pulse: 74 86  Resp: 20 20  Temp: 98 F (36.7 C) 97.6 F (36.4 C)  SpO2: 97% 94%    Physical Exam: Incisions:  R CF access site intact without hematoma Extremities:  bilateral feet warm and well perfused. R DP and L DP/Peroneal doppler signals   CBC    Component Value Date/Time   WBC 11.8 (H) 06/04/2022 0059   RBC 3.60 (L) 06/04/2022 0059   HGB 11.3 (L) 06/04/2022 0059   HGB 13.6 09/10/2021 1529   HCT 32.9 (L) 06/04/2022 0059   HCT 41.0 09/10/2021 1529   PLT 265 06/04/2022 0059   PLT 203 09/10/2021 1529   MCV 91.4 06/04/2022 0059   MCV 96 09/10/2021 1529   MCH 31.4 06/04/2022 0059   MCHC 34.3 06/04/2022 0059   RDW 15.5 06/04/2022 0059   RDW 13.9 09/10/2021 1529   LYMPHSABS 1.0 09/10/2021 1529   MONOABS 1.0 06/12/2021 0746   EOSABS 0.1 09/10/2021 1529   BASOSABS 0.1 09/10/2021 1529    BMET    Component Value Date/Time   NA 134 (L) 06/03/2022 0703   NA 140 09/10/2021 1529   K 4.6 06/03/2022 0703   CL 102 06/03/2022 0703   CO2 19 (L) 06/03/2022 0703   GLUCOSE 105 (H) 06/03/2022 0703   BUN 11 06/03/2022 0703   BUN 8 09/10/2021 1529   CREATININE 0.80 06/03/2022 0703   CALCIUM 8.3 (L) 06/03/2022 0703   GFRNONAA >60 06/03/2022 0703   GFRAA 62 02/12/2020 0933    INR    Component Value Date/Time   INR 3.1 (A) 08/03/2021 1449   INR 2.2 (H) 06/13/2021 0534   INR 2.7 05/22/2010 1333     Intake/Output Summary (Last 24 hours) at 06/04/2022 0752 Last data filed at 06/04/2022 0600 Gross per 24 hour  Intake 680.16 ml  Output 500 ml  Net 180.16 ml      Assessment/Plan:  77 y.o. female is 2 days post op, s/p: aortogram, arteriogram LLE with mechanical thrombectomy of popliteal, AT, TPT and peroneal    -L foot still warm and well perfused. DP/Peroneal doppler signals. No more pain in L  foot -R CF access site intact and soft without hematoma -Continue statin and plavix. On IV heparin currently, will need oral AC prior to d/c -We will arrange a 1 month follow up with our office   Vicente Serene, PA-C [facility] [phone] 06/04/2022 7:52 AM  I have independently interviewed and examined patient and agree with PA assessment and plan above. Left foot is well perfused and pain resolved. Will have f/u as outpatient.   Mills Mitton C. Donzetta Matters, MD [facility] [place] Office: [phone] Pager: [phone]

## 2022-06-04 NOTE — Progress Notes (Addendum)
ANTICOAGULATION CONSULT NOTE  Pharmacy Consult for heparin Indication:  arterial thrombus  No Known Allergies  Patient Measurements: Weight: 101.4 kg (223 lb 8.7 oz) Heparin Dosing Weight: 68kg  Vital Signs: Temp: 97.6 F (36.4 C) (03/08 0735) Temp Source: Oral (03/08 0735) BP: 116/60 (03/08 0735) Pulse Rate: 86 (03/08 0735)  Labs: Recent Labs    06/02/22 0803 06/02/22 0836 06/02/22 1607 06/03/22 0703 06/03/22 0704 06/03/22 0754 06/04/22 0059  HGB 13.1  --  12.9  --  12.0  --  11.3*  HCT 40.7  --  38.0  --  35.5*  --  32.9*  PLT 217  --   --   --  301  --  265  HEPARINUNFRC  --   --   --   --   --  0.36 0.50  CREATININE  --  0.94 0.50 0.80  --   --   --      Estimated Creatinine Clearance: 62.1 mL/min (by C-G formula based on SCr of 0.8 mg/dL).   Medical History: Past Medical History:  Diagnosis Date   Acute diastolic heart failure (HCC)    Anxiety state, unspecified    Asthma    Atrial fibrillation (HCC)    Congestive heart failure, unspecified    Degenerative joint disease    GERD (gastroesophageal reflux disease)    Hyperlipidemia    Hypertension    Lymphedema    Obesity    OSA (obstructive sleep apnea) 06/09/2015   Persistent atrial fibrillation (HCC)     post-termination pauses with afib s/p PPM   Presence of permanent cardiac pacemaker    Sinoatrial node dysfunction (HCC)    s/p PPM   Sleep apnea    compliant with CPAP   Unspecified hypothyroidism    Unspecified venous (peripheral) insufficiency     Medications:  Medications Prior to Admission  Medication Sig Dispense Refill Last Dose   albuterol (VENTOLIN HFA) 108 (90 Base) MCG/ACT inhaler INHALE 2 PUFFS BY MOUTH EVERY 6 HOURS AS NEEDED FOR WHEEZING OR SHORTNESS OF BREATH Strength 108 (90 Base) MCG/ACT 18 g 0 unk   clotrimazole-betamethasone (LOTRISONE) cream APPLY  CREAM TOPICALLY TO AFFECTED AREA TWICE DAILY (Patient taking differently: Apply 1 application  topically 2 (two) times daily.)  45 g 0 unk   diclofenac Sodium (VOLTAREN) 1 % GEL Apply 4 g topically in the morning, at noon, and at bedtime.   unk   diltiazem (CARDIZEM CD) 240 MG 24 hr capsule Take 1 capsule (240 mg total) by mouth daily. 90 capsule 2 Past Week   levothyroxine (SYNTHROID) 150 MCG tablet Take 1 tablet (150 mcg total) by mouth daily. 30 tablet 11 Past Week   metoprolol tartrate (LOPRESSOR) 100 MG tablet Take 1 tablet by mouth twice daily 60 tablet 2 Past Week   Vitamin D, Ergocalciferol, (DRISDOL) 1.25 MG (50000 UNIT) CAPS capsule Take 1 capsule by mouth once a week (Patient taking differently: Take 50,000 Units by mouth every 7 (seven) days.) 12 capsule 1 Past Month   atorvastatin (LIPITOR) 80 MG tablet Take 1 tablet by mouth once daily (Patient not taking: Reported on 02/01/2022) 90 tablet 1    blood glucose meter kit and supplies Dispense based on patient and insurance preference. Use up to four times daily as directed. (FOR ICD-10 E10.9, E11.9). 1 each 0    esomeprazole (NEXIUM) 40 MG capsule Take 1 capsule (40 mg total) by mouth daily. (Patient not taking: Reported on 06/01/2022) 90 capsule 1 Not Taking  ipratropium-albuterol (DUONEB) 0.5-2.5 (3) MG/3ML SOLN Take 3 mLs by nebulization in the morning and at bedtime. (Patient not taking: Reported on 06/01/2022) 360 mL 0 Not Taking   levETIRAcetam (KEPPRA) 750 MG tablet Take 750 mg by mouth in the morning and at bedtime. (Patient not taking: Reported on 06/01/2022)   Not Taking   nystatin (MYCOSTATIN) 100000 UNIT/ML suspension Take 5 mLs (500,000 Units total) by mouth 4 (four) times daily. Swish and swallow (Patient not taking: Reported on 06/01/2022) 60 mL 0 Not Taking   Scheduled:   atorvastatin  40 mg Oral Daily   clopidogrel  75 mg Oral Daily   diltiazem  240 mg Oral Daily   furosemide  20 mg Oral Daily   levETIRAcetam  750 mg Oral BID   levothyroxine  150 mcg Oral Q0600   metoprolol tartrate  100 mg Oral BID   pantoprazole  40 mg Oral Q1200   senna-docusate   1 tablet Oral BID   sodium chloride flush  3 mL Intravenous Q12H   Infusions:   sodium chloride     heparin 1,050 Units/hr (06/03/22 2330)    Assessment: Pt used to be on coumadin for her AF but it was discontinued due to fall risk. She is s/p thrombectomy. Pharmacy consulted to start heparin for arterial embolus.  Heparin level is therapeutic at 0.5, on heparin infusion at 1050 units/hr. Hgb 11.3, plt 285. No s/sx of bleeding or infusion issues. Of note, patient also started on Plavix. Of note. Found to have subacute ischemic infarction - neurology following.    Goal of Therapy:  Heparin level 0.3-0.5 units/ml - reduced given finding of R MCA stroke  Monitor platelets by anticoagulation protocol: Yes   Plan:  Reduce heparin infusion to 1000 units/hr to keep in goal range since on higher end of goal Check heparin level daily  F/u plan for PO AC and Plavix - of note, patient has been paying cash for medications and no insurance on file. Will need patient assistance for DOAC at discharge.  Antonietta Jewel, PharmD, BCCCP Clinical Pharmacist  Phone: [phone] 06/04/2022 7:58 AM  Please check AMION for all [facility] phone numbers After 10:00 PM, call [facility] [phone]

## 2022-06-04 NOTE — Progress Notes (Signed)
SLP Cancellation Note  Patient Details Name: Megan [name] MRN: [id number] DOB: 08-16-1945   Cancelled treatment:       Reason Eval/Treat Not Completed: Patient at procedure or test/unavailable (Pt off unit for CTA. SLP will follow up later.)  Megan [name], [place], [place] Office number [phone]  Horton Marshall 06/04/2022, 10:06 AM

## 2022-06-04 NOTE — Progress Notes (Signed)
Physical Therapy Treatment Patient Details Name: Megan [name] MRN: [id number] DOB: December 27, 1945 Today's Date: 06/04/2022   History of Present Illness Megan [name] is a 77 y.o. female presented to [facility] ED on 3/4 with left leg pain. CTA revealed postive fro acute appearing occlusion of distal L popliteal artery. Pt transferred to [facility] PMH: symptomatic second-degree AV block status post PPM, permanent A-fib not on anticoagulation due to history of fall and SDH, class III obesity (BMI 41.81), hypertension, chronic HFpEF, anxiety, COPD, GERD, hyperlipidemia, lymphedema, OSA, hypothyroidism, PVD    PT Comments    Pt had fair tolerance to treatment today. Pt received during pericare with NT. Pt was total A for pericare and +2 Max for bed mobility. Pt demonstrated poor safety awareness today and required max verbal and tactile cues for safety. Pt also required the use of the stedy in order to transfer from bed to chair. Pt appears to be regressing in mobility status compared to previous session however unsure if this was due to lethargy or if this is pt new baseline. No change in DC/DME recs.   Recommendations for follow up therapy are one component of a multi-disciplinary discharge planning process, led by the attending physician.  Recommendations may be updated based on patient status, additional functional criteria and insurance authorization.  Follow Up Recommendations  Skilled nursing-short term rehab (<3 hours/day) Can patient physically be transported by private vehicle: No   Assistance Recommended at Discharge Frequent or constant Supervision/Assistance  Patient can return home with the following A lot of help with walking and/or transfers;A lot of help with bathing/dressing/bathroom;Assist for transportation;Help with stairs or ramp for entrance;Two people to help with bathing/dressing/bathroom;Assistance with cooking/housework   Equipment Recommendations  None recommended by PT  (Per accepting facility)    Recommendations for Other Services       Precautions / Restrictions Precautions Precautions: Fall Precaution Comments: morbidly obese, watch O2 sats Restrictions Weight Bearing Restrictions: No     Mobility  Bed Mobility Overal bed mobility: Needs Assistance Bed Mobility: Supine to Sit     Supine to sit: +2 for physical assistance, Max assist     General bed mobility comments: Pt unable to sit up on her own. Required total assistance for pericare. Pt demonstrated a heavy anterior lean when seated EOB required max VC and max A to keep from sliding off EOB.    Transfers Overall transfer level: Needs assistance Equipment used: Ambulation equipment used Transfers: Bed to chair/wheelchair/BSC             General transfer comment: Pt required max VC for safety and proper sequencing. Transfer via Lift Equipment: Stedy  Ambulation/Gait                   Stairs             Wheelchair Mobility    Modified Rankin (Stroke Patients Only)       Balance Overall balance assessment: Needs assistance Sitting-balance support: Feet supported, No upper extremity supported Sitting balance-Leahy Scale: Poor Sitting balance - Comments: Heavy anterior lean with max VC and tactile cues for upright posture   Standing balance support: Reliant on assistive device for balance, Bilateral upper extremity supported Standing balance-Leahy Scale: Poor Standing balance comment: dependent on Stedy                            Cognition Arousal/Alertness: Awake/alert Behavior During Therapy: Flat affect Overall Cognitive  Status: Impaired/Different from baseline                                 General Comments: pt with decreased insight to safety, doesn't appear to recognize she can't care for herself. Slow processing at times.  Decreased awareness in general        Exercises      General Comments General comments  (skin integrity, edema, etc.): VSS on RA. Pt in NAD      Pertinent Vitals/Pain Pain Assessment Pain Assessment: No/denies pain    Home Living     Available Help at Discharge: Family;Available PRN/intermittently Type of Home: House                  Prior Function            PT Goals (current goals can now be found in the care plan section) Progress towards PT goals: Not progressing toward goals - comment (Pt appears to be regressing compared to previous session)    Frequency    Min 2X/week      PT Plan Current plan remains appropriate    Co-evaluation              AM-PAC PT "6 Clicks" Mobility   Outcome Measure  Help needed turning from your back to your side while in a flat bed without using bedrails?: A Lot Help needed moving from lying on your back to sitting on the side of a flat bed without using bedrails?: A Lot Help needed moving to and from a bed to a chair (including a wheelchair)?: Total Help needed standing up from a chair using your arms (e.g., wheelchair or bedside chair)?: A Lot Help needed to walk in hospital room?: Total Help needed climbing 3-5 steps with a railing? : Total 6 Click Score: 9    End of Session   Activity Tolerance: Patient limited by lethargy;Other (comment) (treatment limited by cognitive deficits) Patient left: in chair;with call bell/phone within reach;with chair alarm set;with nursing/sitter in room Nurse Communication: Need for lift equipment;Mobility status PT Visit Diagnosis: Unsteadiness on feet (R26.81);Muscle weakness (generalized) (M62.81);Difficulty in walking, not elsewhere classified (R26.2)     Time: [id number] PT Time Calculation (min) (ACUTE ONLY): 27 min  Charges:  $Therapeutic Activity: 23-37 mins                     Shelby Mattocks, PT, DPT Acute Rehab Services [id number]    Viann Shove 06/04/2022, 4:03 PM

## 2022-06-04 NOTE — Evaluation (Signed)
Speech Language Pathology Evaluation Patient Details Name: Megan [name] MRN: [id number] DOB: 1946-03-16 Today's Date: 06/04/2022 Time: [id number] SLP Time Calculation (min) (ACUTE ONLY): 17 min  Problem List:  Patient Active Problem List   Diagnosis Date Noted   Leg pain 06/01/2022   Traumatic subdural hemorrhage, initial encounter ([place]) 08/16/2021   DNR (do not resuscitate) 06/12/2021   Possible Lytic bone lesions on CT 06/12/2021   Hyperglycemia 05/18/2021   Chronic congestive heart failure ([place]) 08/24/2019   Intertrigo 05/11/2017   OAB (overactive bladder) 02/22/2017   Lymphedema 01/26/2017   Gastroesophageal reflux disease without esophagitis 01/26/2017   Acquired hypothyroidism 01/26/2017   Mixed hyperlipidemia 01/26/2017   Generalized weakness 08/17/2015   Leukocytosis 08/17/2015   Pyrexia    Sleeps in sitting position due to orthopnea 07/31/2015   Morbid obesity due to excess calories ([place]) 06/09/2015   PVD (peripheral vascular disease) ([place]) 06/09/2015   Long term (current) use of anticoagulants 06/19/2010   Tachycardia-bradycardia syndrome ([place]) 11/18/2008   PPM-[place] 11/18/2008   HYPERTENSION, BENIGN 02/21/2008   Permanent atrial fibrillation ([place]) 02/21/2008   Past Medical History:  Past Medical History:  Diagnosis Date   Acute diastolic heart failure (HCC)    Anxiety state, unspecified    Asthma    Atrial fibrillation (HCC)    Congestive heart failure, unspecified    Degenerative joint disease    GERD (gastroesophageal reflux disease)    Hyperlipidemia    Hypertension    Lymphedema    Obesity    OSA (obstructive sleep apnea) 06/09/2015   Persistent atrial fibrillation (HCC)     post-termination pauses with afib s/p PPM   Presence of permanent cardiac pacemaker    Sinoatrial node dysfunction ([place])    s/p PPM   Sleep apnea    compliant with CPAP   Unspecified hypothyroidism    Unspecified venous (peripheral) insufficiency    Past Surgical  History:  Past Surgical History:  Procedure Laterality Date   ABDOMINAL AORTOGRAM W/LOWER EXTREMITY N/A 06/02/2022   Procedure: ABDOMINAL AORTOGRAM W/LOWER EXTREMITY;  Surgeon: Broadus John, MD;  Location: [facility];  Service: Cardiovascular;  Laterality: N/A;   CATARACT EXTRACTION W/PHACO Left 09/09/2014   Procedure: CATARACT EXTRACTION PHACO AND INTRAOCULAR LENS PLACEMENT (IOC);  Surgeon: Tonny Branch, MD;  Location: [facility];  Service: Ophthalmology;  Laterality: Left;  CDE: 6.99   CATARACT EXTRACTION W/PHACO Right 10/07/2014   Procedure: CATARACT EXTRACTION PHACO AND INTRAOCULAR LENS PLACEMENT RIGHT EYE;  Surgeon: Tonny Branch, MD;  Location: [facility];  Service: Ophthalmology;  Laterality: Right;  CDE:5.16   PACEMAKER INSERTION     SJM by [place]  06/02/2022   Procedure: PERIPHERAL VASCULAR THROMBECTOMY;  Surgeon: Broadus John, MD;  Location: [facility];  Service: Cardiovascular;;   HPI:  Pt is a 77 y.o. female who presented with left leg pain. CT head 3/6: Ill-defined hypodensity in the right frontotemporal region, with some loss of gray-white differentiation in this area, and possible hypodensity in the right basal ganglia, concerning for acute infarct. Yale swallow screen not completed and SLP consulted for swallow evaluation. PMH: symptomatic second-degree AV block status post PPM, permanent A-fib not on anticoagulation due to history of fall and SDH, class III obesity (BMI 41.81), hypertension, chronic HFpEF, anxiety, COPD, GERD, hyperlipidemia, lymphedema, OSA, hypothyroidism, PVD.   Assessment / Plan / Recommendation Clinical Impression  Pt participated in speech-language-cognition evaluation. Pt reported that she has had difficulty with cognition since her  CVA last year. She initially denied any acute changes in speech, language, or cognition, but after being evaluated she stated that she is now "slow" and would have performed better prior. The  [facility] Mental Status Examination was completed to evaluate the pt's cognitive-linguistic skills. She achieved a score of 12/30 which is below the normal limits of 27 or more out of 30. She exhibited impairments attention, memory, and executive function as it relates to awareness, mental flexibility, and problem solving. Articulatory imprecision was noted, but her speech was intelligible and, considering some documentation of dysarthria in May, 2023, SLP questions whether her speech may be at baseline. Skilled SLP services are clinically indicated at this time to improve cognitive-linguistic function.    SLP Assessment  SLP Recommendation/Assessment: Patient needs continued Speech Lanaguage Pathology Services SLP Visit Diagnosis: Dysarthria and anarthria (R47.1);Cognitive communication deficit (R41.841)    Recommendations for follow up therapy are one component of a multi-disciplinary discharge planning process, led by the attending physician.  Recommendations may be updated based on patient status, additional functional criteria and insurance authorization.    Follow Up Recommendations  Skilled nursing-short term rehab (<3 hours/day)    Assistance Recommended at Discharge  Frequent or constant Supervision/Assistance  Functional Status Assessment Patient has had a recent decline in their functional status and demonstrates the ability to make significant improvements in function in a reasonable and predictable amount of time.  Frequency and Duration min 2x/week  2 weeks      SLP Evaluation Cognition  Overall Cognitive Status: Impaired/Different from baseline Arousal/Alertness: Awake/alert Orientation Level: Oriented X4 Year: 2024 Month: March Day of Week: Correct Attention: Focused;Sustained Focused Attention: Appears intact Sustained Attention: Impaired Sustained Attention Impairment: Verbal complex Memory: Impaired Memory Impairment:  (Immediate: 5/5 with repetition x4;  delayed: 0/5; with cues: 2/5) Awareness: Impaired Awareness Impairment: Emergent impairment Problem Solving: Impaired Problem Solving Impairment: Verbal complex (money: 1/3; time: 0/2) Executive Function: Organizing;Sequencing Sequencing: Impaired Sequencing Impairment: Verbal complex (clock: 0/4) Organizing: Impaired Organizing Impairment: Verbal complex (backward digit span: 1/2)       Comprehension  Auditory Comprehension Overall Auditory Comprehension: Appears within functional limits for tasks assessed Yes/No Questions: Within Functional Limits Commands: Within Functional Limits Interfering Components: Processing speed;Working Marine scientist;Attention    Expression Expression Primary Mode of Expression: Verbal Verbal Expression Overall Verbal Expression: Appears within functional limits for tasks assessed Initiation: No impairment Repetition: No impairment Naming: No impairment   Oral / Motor  Oral Motor/Sensory Function Overall Oral Motor/Sensory Function: Within functional limits Motor Speech Overall Motor Speech: Impaired at baseline Respiration: Within functional limits Resonance: Within functional limits Articulation: Impaired Level of Impairment: Conversation Intelligibility: Intelligible Motor Planning: Witnin functional limits Motor Speech Errors: Aware;Consistent           Jawan Chavarria I. Hardin Negus, [place], [place] Office number [phone]  Horton Marshall 06/04/2022, 2:32 PM

## 2022-06-04 NOTE — Evaluation (Signed)
Clinical/Bedside Swallow Evaluation Patient Details  Name: ATHENNA ELKIND MRN: [id number] Date of Birth: 01-04-1946  Today's Date: 06/04/2022 Time: SLP Start Time (ACUTE ONLY): 1234 SLP Stop Time (ACUTE ONLY): 1334 SLP Time Calculation (min) (ACUTE ONLY): 12 min  Past Medical History:  Past Medical History:  Diagnosis Date   Acute diastolic heart failure ([place])    Anxiety state, unspecified    Asthma    Atrial fibrillation (HCC)    Congestive heart failure, unspecified    Degenerative joint disease    GERD (gastroesophageal reflux disease)    Hyperlipidemia    Hypertension    Lymphedema    Obesity    OSA (obstructive sleep apnea) 06/09/2015   Persistent atrial fibrillation (HCC)     post-termination pauses with afib s/p PPM   Presence of permanent cardiac pacemaker    Sinoatrial node dysfunction ([place])    s/p PPM   Sleep apnea    compliant with CPAP   Unspecified hypothyroidism    Unspecified venous (peripheral) insufficiency    Past Surgical History:  Past Surgical History:  Procedure Laterality Date   ABDOMINAL AORTOGRAM W/LOWER EXTREMITY N/A 06/02/2022   Procedure: ABDOMINAL AORTOGRAM W/LOWER EXTREMITY;  Surgeon: Broadus John, MD;  Location: [facility];  Service: Cardiovascular;  Laterality: N/A;   CATARACT EXTRACTION W/PHACO Left 09/09/2014   Procedure: CATARACT EXTRACTION PHACO AND INTRAOCULAR LENS PLACEMENT (IOC);  Surgeon: Tonny Branch, MD;  Location: [facility];  Service: Ophthalmology;  Laterality: Left;  CDE: 6.99   CATARACT EXTRACTION W/PHACO Right 10/07/2014   Procedure: CATARACT EXTRACTION PHACO AND INTRAOCULAR LENS PLACEMENT RIGHT EYE;  Surgeon: Tonny Branch, MD;  Location: [facility];  Service: Ophthalmology;  Laterality: Right;  CDE:5.16   PACEMAKER INSERTION     SJM by [place]  06/02/2022   Procedure: PERIPHERAL VASCULAR THROMBECTOMY;  Surgeon: Broadus John, MD;  Location: [facility];  Service: Cardiovascular;;   HPI:   Pt is a 77 y.o. female who presented with left leg pain. CT head 3/6: Ill-defined hypodensity in the right frontotemporal region, with some loss of gray-white differentiation in this area, and possible hypodensity in the right basal ganglia, concerning for acute infarct. Yale swallow screen not completed and SLP consulted for swallow evaluation. PMH: symptomatic second-degree AV block status post PPM, permanent A-fib not on anticoagulation due to history of fall and SDH, class III obesity (BMI 41.81), hypertension, chronic HFpEF, anxiety, COPD, GERD, hyperlipidemia, lymphedema, OSA, hypothyroidism, PVD.    Assessment / Plan / Recommendation  Clinical Impression  Pt was seen for bedside swallow evaluation and she denied a history of dysphagia. RN denied difficulty with meals and stated that she's been tolerating meds whole with liquid without difficulty. Oral mechanism exam was [facility] and dentition was reduced. She tolerated all solids and liquids without signs or symptoms of oropharyngeal dysphagia. A regular texture diet with thin liquids is recommended at this time and further skilled SLP services are not clinically indicated for swallowing. SLP Visit Diagnosis: Dysphagia, unspecified (R13.10)    Aspiration Risk  No limitations    Diet Recommendation Regular;Thin liquid   Liquid Administration via: Cup;Straw Medication Administration: Whole meds with liquid Supervision: Staff to assist with self feeding    Other  Recommendations Oral Care Recommendations: Oral care BID    Recommendations for follow up therapy are one component of a multi-disciplinary discharge planning process, led by the attending physician.  Recommendations may be updated based on patient status, additional  functional criteria and insurance authorization.  Follow up Recommendations No SLP follow up      Assistance Recommended at Discharge    Functional Status Assessment Patient has not had a recent decline in their  functional status  Frequency and Duration min 2x/week          Prognosis        Swallow Study   General Date of Onset: 06/02/22 HPI: Pt is a 77 y.o. female who presented with left leg pain. CT head 3/6: Ill-defined hypodensity in the right frontotemporal region, with some loss of gray-white differentiation in this area, and possible hypodensity in the right basal ganglia, concerning for acute infarct. Yale swallow screen not completed and SLP consulted for swallow evaluation. PMH: symptomatic second-degree AV block status post PPM, permanent A-fib not on anticoagulation due to history of fall and SDH, class III obesity (BMI 41.81), hypertension, chronic HFpEF, anxiety, COPD, GERD, hyperlipidemia, lymphedema, OSA, hypothyroidism, PVD. Type of Study: Bedside Swallow Evaluation Previous Swallow Assessment: none stated Diet Prior to this Study: Regular;Thin liquids (Level 0) Temperature Spikes Noted: No Respiratory Status: Room air History of Recent Intubation: No Behavior/Cognition: Alert;Cooperative;Pleasant mood Oral Cavity Assessment: Within Functional Limits Oral Care Completed by SLP: No Oral Cavity - Dentition: Adequate natural dentition;Missing dentition Vision: Functional for self-feeding Self-Feeding Abilities: Able to feed self Patient Positioning: Upright in bed;Postural control adequate for testing Baseline Vocal Quality: Normal Volitional Cough: Strong Volitional Swallow: Able to elicit    Oral/Motor/Sensory Function Overall Oral Motor/Sensory Function: Within functional limits   Ice Chips Ice chips: Not tested   Thin Liquid Thin Liquid: Within functional limits Presentation: Straw    Nectar Thick Nectar Thick Liquid: Not tested   Honey Thick Honey Thick Liquid: Not tested   Puree Puree: Within functional limits Presentation: Spoon   Solid     Solid: Within functional limits Presentation: [place] I. Hardin Negus, [place], [place] Office number [phone]  Horton Marshall 06/04/2022,1:52 PM

## 2022-06-04 NOTE — TOC Benefit Eligibility Note (Signed)
Patient Teacher, English as a foreign language completed.    The patient does not have prescription coverage.  Lyndel Safe, [place] Patient Advocate Specialist [place] Patient Advocate Team Direct Number: [phone]  Fax: [phone]

## 2022-06-04 NOTE — TOC Progression Note (Signed)
Transition of Care [facility]) - Progression Note    Patient Details  Name: Megan [name] MRN: [id number] Date of Birth: 07-28-1945  Transition of Care [facility]) CM/SW [place], LCSW Phone Number: 06/04/2022, 1:03 PM  Clinical Narrative:    CSW met with pt at bedside with son present as well. Pt and son both expressed that would like to accept bed offer from [facility]. CSW reached out to SNF to learn of earliest bed availability. SNF reported that have a bed ready for pt. MD informed CSW that pt could potentially be ready to dc tomorrow.   TOC will continue to follow this admission.    Expected Discharge Plan: [place] Barriers to Discharge: Continued Medical Work up  Expected Discharge Plan and Services       Living arrangements for the past 2 months: Single Family Home                                       Social Determinants of Health (SDOH) Interventions SDOH Screenings   Food Insecurity: No Food Insecurity (06/01/2022)  Housing: Low Risk  (06/01/2022)  Transportation Needs: No Transportation Needs (06/01/2022)  Utilities: Not At Risk (06/01/2022)  Alcohol Screen: Low Risk  (12/14/2021)  Depression (PHQ2-9): Low Risk  (12/14/2021)  Recent Concern: Depression (PHQ2-9) - Medium Risk (10/22/2021)  Financial Resource Strain: Low Risk  (12/14/2021)  Physical Activity: Inactive (12/14/2021)  Social Connections: Socially Isolated (12/14/2021)  Stress: No Stress Concern Present (12/14/2021)  Tobacco Use: Medium Risk (06/02/2022)    Readmission Risk Interventions     No data to display         Beckey Rutter, MSW, LCSWA, LCASA Transitions of Care  Clinical Social Worker I

## 2022-06-04 NOTE — Progress Notes (Signed)
PROGRESS NOTE    Megan [name]  [id number] DOB: 07/28/1945 DOA: 05/31/2022 PCP: Megan Balloon, FNP  No chief complaint on file.   Brief Narrative:   Obese chronically ill female 77/F with history of second-degree AV block with PPM, permanent Megan [name] not on anticoagulation following history of fall and SDH, hypertension, chronic diastolic CHF, COPD, anxiety, lymphedema, OSA, hypothyroidism presented to [facility] ED with left knee/leg pain. Afebrile, vital signs stable, CT Indica Marcott of abdominal aorta with [name] noted acute appearing occlusion in the distal left popliteal artery extending to trifurcation vessels, posterior tibial appeared occluded posteriorly   Assessment & Plan:   Principal Problem:   PVD (peripheral vascular disease) (HCC) Active Problems:   HYPERTENSION, BENIGN   Leukocytosis   Acquired hypothyroidism   Mixed hyperlipidemia   Chronic congestive heart failure (HCC)   Leg pain  Right Upper Lobe Consolidation Will follow chest CT  Concern for Acute/Subacute Stroke CT head obtained given prior hx of SDH and was notable for findings concerning for acute infarct Her pacemaker is not MR compatible, confirmed with [employer] rep via cardmaster Neurology consult  Echo EF 60-65%, no RWMA, carotid US 1-39% bilaterally to carotids, antegrade flow in vertebral arteries bilaterally LDL 145, A1c 5.5 Atorvastatin, heparin gtt for now - needs oral anticoagulation long term - issue is lack of insurance CTA head/neck with evolving R MCA territory infarct, also recent small L cerebellum SCA territory infarcts.  Negative for LVO, but absent distal R MCA branches in posterior division.  Mild to moderate bilateral supraclinoid ICA plaque.  Non dominant distal vertebral artery stenosis.  R ICA FMD.   PT/OT/SLP   Peripheral vascular disease, occlusion of the distal left popliteal artery -CTA abdominal aorta and bilateral iliofemoral runoff W Wo contrast showing acute appearing  occlusion of the distal left popliteal artery , posterior tibial artery appears occluded proximally. The PTA does reconstitute via collateral flow.  -s/p left lower extremity angiogram 3/6 by vascular, mechanical thrombectomy -appreciate vascular recs, likely afib embolus, needs anticoagulation -continuing plavix and statin - will likely transition to PO doac at discharge - issue is lack of insurance, will need follow up outpatient -PT eval, very poor functional status at baseline mostly nonambulatory, stays in recliner all day and night   Mild leukocytosis Likely reactive, afebrile and nontoxic, improving   Left knee pain -Suspect pain is primarily secondary to osteoarthritis and unrelated to distal PAD -X-ray with severe degenerative changes especially in the medial compartment, PT eval, Vicodin as needed  History of symptomatic second-degree AV block status post PPM -Due for pacemaker battery replacement, will discuss with cardmaster -per discussion with EP app, not at [facility] yet, no urgency for follow up yet   Permanent Megan [name]-fib Not on anticoagulation following Emin Foree fall and subdural hematoma in 5/23, was admitted at [facility] then -Continue metoprolol and Cardizem -heparin gtt   Chronic HFpEF CT showing small right pleural effusion (care everywhere) -Add low-dose p.o. Lasix tomorrow.  Follow BNP (elevated).   COPD: Stable, no signs of acute exacerbation.   Hypothyroidism -Continue Synthroid   Seizure disorder -Keppra - wasn't taking this outpatient? Will need to follow up, med rec showed she wasn't taking this    DVT prophylaxis: heparin gtt Code Status: DNR Family Communication: none Disposition:   Status is: Inpatient Remains inpatient appropriate because: need for continued w/u   Consultants:  Vascular neurology  Procedures:  3/6 1.  Ultrasound-guided micropuncture access of the right common femoral artery 2.  Aortogram 3.  Secondary cannulation, left lower extremity  angiogram 4.  Mechanical thrombectomy using 7 French penumbra lightening system to the popliteal artery, anterior tibial artery, tibioperoneal trunk, peroneal artery 5.  Moderate sedation 82 minutes, contrast volume 130 mL  Antimicrobials:  Anti-infectives (From admission, onward)    None       Subjective: No new complaints  Objective: Vitals:   06/04/22 0012 06/04/22 0529 06/04/22 0735 06/04/22 0821  BP: (!) 129/59 (!) 114/59 116/60 (!) 108/55  Pulse: (!) 106 74 86   Resp: '20 20 20   '$ Temp: 98.1 F (36.7 C) 98 F (36.7 C) 97.6 F (36.4 C)   TempSrc: Oral Oral Oral   SpO2: 93% 97% 94%   Weight: 101.4 kg       Intake/Output Summary (Last 24 hours) at 06/04/2022 1337 Last data filed at 06/04/2022 1308 Gross per 24 hour  Intake 657.16 ml  Output 500 ml  Net 157.16 ml   Filed Weights   06/02/22 0023 06/03/22 0017 06/04/22 0012  Weight: 98.3 kg 99.5 kg 101.4 kg    Examination:  General: No acute distress. Cardiovascular: irregularly irregular Lungs: unlabored Abdomen: Soft, nontender, nondistended  Neurological: Alert, moving all extremities Extremities: No clubbing or cyanosis. No edema.  Data Reviewed: I have personally reviewed following labs and imaging studies  CBC: Recent Labs  Lab 06/01/22 0043 06/02/22 0803 06/02/22 1607 06/03/22 0704 06/04/22 0059  WBC 15.3* 11.6*  --  13.1* 11.8*  HGB 13.7 13.1 12.9 12.0 11.3*  HCT 40.7 40.7 38.0 35.5* 32.9*  MCV 91.9 94.4  --  92.2 91.4  PLT PLATELET CLUMPS NOTED ON SMEAR, UNABLE TO ESTIMATE 217  --  301 [id number]    Basic Metabolic Panel: Recent Labs  Lab 06/01/22 0043 06/02/22 0836 06/02/22 1607 06/03/22 0703 06/04/22 0059  NA 131* 134* 123* 134* 132*  K 4.4 4.5 3.8 4.6 4.3  CL 100 102 90* 102 102  CO2 20* 18*  --  19* 19*  GLUCOSE 96 91 98 105* 92  BUN 7* '9 9 11 9  '$ CREATININE 0.83 0.94 0.50 0.80 1.02*  CALCIUM 8.4* 8.7*  --  8.3* 8.2*    GFR: Estimated Creatinine Clearance: 48.7 mL/min (Carolann Brazell) (by C-G  formula based on SCr of 1.02 mg/dL (H)).  Liver Function Tests: Recent Labs  Lab 06/04/22 0059  AST 34  ALT 21  ALKPHOS 126  BILITOT 1.3*  PROT 5.2*  ALBUMIN 2.3*    CBG: Recent Labs  Lab 06/02/22 1116  GLUCAP 112*     No results found for this or any previous visit (from the past 240 hour(s)).       Radiology Studies: DG CHEST PORT 1 VIEW  Result Date: 06/04/2022 CLINICAL DATA:  Lung nodule.  Pneumonia EXAM: PORTABLE CHEST 1 VIEW COMPARISON:  X-ray 11/12/2021 and older FINDINGS: Left chest pacemaker. Overlapping cardiac leads. Borderline cardiopericardial silhouette. Underinflation. No pneumothorax, effusion or edema. There is focal opacity along the inferior right upper lobe with the adjacent thickening of the minor fissure. Acute infiltrate is possible such as pneumonia. Recommend follow-up to confirm clearance. Calcifications along the tracheobronchial tree. IMPRESSION: Right upper lobe consolidative opacity. Possible pneumonia. Recommend follow-up to confirm clearance. Pacemaker Electronically Signed   By: Jill Side M.D.   On: 06/04/2022 12:42   CT ANGIO HEAD NECK W WO CM  Result Date: 06/04/2022 CLINICAL DATA:  77 year old female with Quayshawn Nin history of subdural hematoma, but on head CT 2 days ago suspicion of  right MCA territory cytotoxic edema, recent infarction. Subsequent encounter. EXAM: CT ANGIOGRAPHY HEAD AND NECK TECHNIQUE: Multidetector CT imaging of the head and neck was performed using the standard protocol during bolus administration of intravenous contrast. Multiplanar CT image reconstructions and MIPs were obtained to evaluate the vascular anatomy. Carotid stenosis measurements (when applicable) are obtained utilizing NASCET criteria, using the distal internal carotid diameter as the denominator. RADIATION DOSE REDUCTION: This exam was performed according to the departmental dose-optimization program which includes automated exposure control, adjustment of the mA  and/or kV according to patient size and/or use of iterative reconstruction technique. CONTRAST:  73m OMNIPAQUE IOHEXOL 350 MG/ML SOLN COMPARISON:  Head CT 06/02/2022 and earlier. Chest CTA 06/12/2021. FINDINGS: CT HEAD Brain: More discrete appearing cytotoxic edema now in the posterior right insula and tracking along the lateral perirolandic cortex (series 6, image 17). Evidence of right basal ganglia involvement also, although possibly sparing the right caudate head. No hemorrhagic transformation. No significant intracranial mass effect. Background confluent and widespread bilateral white matter hypodensity. Additionally, linear roughly 2 cm hypodense area in the left superior cerebellum also seems increased from the recent comparison suggesting subacute ischemia there also. Elsewhere gray-white differentiation appears stable. No ventriculomegaly. Calvarium and skull base: No acute osseous abnormality identified. Paranasal sinuses: Visualized paranasal sinuses and mastoids are clear. Orbits: No acute orbit or scalp soft tissue finding. CTA NECK Skeleton: Carious and absent maxillary dentition. Osteopenia. Cervical spine degeneration. No acute osseous abnormality identified. Upper chest: Abnormal confluent airspace opacity throughout the posterior and lateral right upper lobe on series 17, image 518. This is new since the chest CTA last year and may be pneumonia, but lung mass would be difficult to exclude. Visible superior mediastinal lymph nodes appear to be stable. Other neck: Left chest trans venous pacemaker type device partially visible. Retropharyngeal course of the carotids but no acute neck soft tissue finding identified. Aortic arch: 4 vessel arch configuration. Left vertebral artery arises directly from the arch. Calcified aortic atherosclerosis. Right carotid system: Mildly tortuous brachiocephalic artery and proximal right CCA with no significant plaque or stenosis. Partially retropharyngeal course of  the right carotid and right carotid bifurcation with mild calcified plaque but no significant stenosis to the skull base. However, beaded appearance of the cervical right ICA at the C1 level series 15, image 25. Left carotid system: Normal left CCA origin. Tortuous proximal left CCA. Partially retropharyngeal course. Minimal calcified plaque at the left carotid bifurcation. No stenosis or significant vessel irregularity. Vertebral arteries: Atherosclerosis and tortuosity with Lilyrose Tanney kinked appearance of the proximal right subclavian artery resulting in 50% stenosis series 12, image 166. But normal right vertebral artery origin (same image). Right vertebral artery is mildly tortuous in the neck but patent to the skull base with no significant plaque or stenosis. Slightly dominant left vertebral artery arises directly from the arch. Tortuous left V1 segment. Late entry into the cervical transverse foramen. No significant plaque or stenosis to the skull base. CTA HEAD Posterior circulation: Dominant left vertebral artery is patent to the basilar without stenosis. Normal left PICA origin. Right vertebral V4 segment is diminutive and atherosclerotic beyond the normal right PICA origin. Up to moderate right V4 segment stenosis but patent right vertebrobasilar junction. Patent basilar artery without stenosis. Patent SCA and right PCA origins. Fetal type left PCA origin. Right posterior communicating artery diminutive or absent. Bilateral PCA branches are within normal limits. Anterior circulation: Both ICA siphons are patent. Left siphon appears dominant owing to the ACA anatomy.  Left siphon supraclinoid calcified plaque with mild to moderate associated stenosis. Normal left posterior communicating artery. Patent left ICA terminus, MCA and ACA origins. Contralateral right ICA siphon with similar mild to moderate supraclinoid calcified plaque and stenosis. Patent right carotid terminus, MCA and ACA origins. Dominant left and  diminutive right ACA A1 segments. Normal anterior communicating artery. Bilateral ACA branches are within normal limits. Left MCA origin is mildly irregular and stenotic series 13, image 21, but the left M1 remains patent with no significant stenosis. Patent left MCA trifurcation. Left MCA branches are within normal limits. Right MCA M1 segment is patent without stenosis. Right MCA bifurcation is patent without stenosis. No right M2 branch occlusion identified. And no definite M3 branch occlusion but there is Hiawatha Merriott paucity of posterior MCA territory branches on series 16, image 50. Venous sinuses: Early contrast timing, grossly patent. Anatomic variants: Dominant left vertebral artery arises directly from the aortic arch. Fetal type left PCA origin. Dominant left and diminutive right ACA A1 segments. Review of the MIP images confirms the above findings IMPRESSION: 1. Evolving Right MCA territory infarct (up to moderate size, predominantly posterior division). But also evidence of recent small Left Cerebellum SCA territory infarcts also. No hemorrhagic transformation or significant mass effect. 2. CTA negative for large vessel occlusion, but positive for absent distal right MCA branches in the posterior division. Also mild to moderate bilateral Supraclinoid ICA plaque, non dominant distal vertebral artery and stenosis. Also evidence of cervical Right ICA  Fibromuscular Dysplasia (FMD). 3. Positive also for partially visible Right upper lobe consolidation. Favor pneumonia, but lung mass difficult to exclude. 4.  Aortic Atherosclerosis (ICD10-I70.0). Study discussed by telephone with Dr. Rosalin Hawking on 06/04/2022 at 11:09. He advises the patient has atrial fibrillation and has not been anticoagulated due to prior subdural hematoma. Electronically Signed   By: Genevie Ann M.D.   On: 06/04/2022 11:13   VAS US CAROTID  Result Date: 06/04/2022 Carotid Arterial Duplex Study Patient Name:  Megan [name]  Date of Exam:   06/03/2022  Medical Rec #: [id number]         Accession #:    [id number] Date of Birth: 09/08/1945         Patient Gender: F Patient Age:   64 years Exam Location:  [facility] Procedure:      VAS US CAROTID Referring Phys: Mozelle Remlinger POWELL JR --------------------------------------------------------------------------------  Indications:  CVA. Risk Factors: Hypertension, hyperlipidemia, past history of smoking. Limitations   Today's exam was limited due to the body habitus of the patient               and the patient's respiratory variation. Performing Technologist: Darlin Coco RDMS, RVT  Examination Guidelines: Ormand Senn complete evaluation includes B-mode imaging, spectral Doppler, color Doppler, and power Doppler as needed of all accessible portions of each vessel. Bilateral testing is considered an integral part of Anadalay Macdonell complete examination. Limited examinations for reoccurring indications may be performed as noted.  Right Carotid Findings: +----------+--------+--------+--------+------------------+--------+           PSV cm/sEDV cm/sStenosisPlaque DescriptionComments +----------+--------+--------+--------+------------------+--------+ CCA Prox  44      6                                          +----------+--------+--------+--------+------------------+--------+ CCA Distal36      6                                          +----------+--------+--------+--------+------------------+--------+  ICA Prox  35      7       1-39%   heterogenous               +----------+--------+--------+--------+------------------+--------+ ICA Distal65      14                                         +----------+--------+--------+--------+------------------+--------+ ECA       46                                                 +----------+--------+--------+--------+------------------+--------+ +----------+--------+-------+----------------+-------------------+           PSV cm/sEDV cmsDescribe        Arm Pressure  (mmHG) +----------+--------+-------+----------------+-------------------+ Subclavian120            Multiphasic, WNL                    +----------+--------+-------+----------------+-------------------+ +---------+--------+--+--------+-+---------+ VertebralPSV cm/s23EDV cm/s4Antegrade +---------+--------+--+--------+-+---------+  Left Carotid Findings: +----------+--------+--------+--------+------------------+--------+           PSV cm/sEDV cm/sStenosisPlaque DescriptionComments +----------+--------+--------+--------+------------------+--------+ CCA Prox  57      8                                          +----------+--------+--------+--------+------------------+--------+ CCA Distal58      12                                         +----------+--------+--------+--------+------------------+--------+ ICA Prox  52      13      1-39%   heterogenous               +----------+--------+--------+--------+------------------+--------+ ICA Distal82      19                                         +----------+--------+--------+--------+------------------+--------+ ECA       62      9                                          +----------+--------+--------+--------+------------------+--------+ +----------+--------+--------+----------------+-------------------+           PSV cm/sEDV cm/sDescribe        Arm Pressure (mmHG) +----------+--------+--------+----------------+-------------------+ [id number]             Multiphasic, WNL                    +----------+--------+--------+----------------+-------------------+ +---------+--------+--+--------+-+---------+ VertebralPSV cm/s30EDV cm/s5Antegrade +---------+--------+--+--------+-+---------+   Summary: Right Carotid: Velocities in the right ICA are consistent with Davion Meara 1-39% stenosis. Left Carotid: Velocities in the left ICA are consistent with Giovana Faciane 1-39% stenosis. Vertebrals:  Bilateral vertebral arteries  demonstrate antegrade flow. Subclavians: Normal flow hemodynamics were seen in bilateral subclavian              arteries. *See table(s) above for measurements and observations.  Electronically signed by Antony Contras MD on 06/04/2022 at 8:31:10 AM.    Final    ECHOCARDIOGRAM COMPLETE  Result Date: 06/03/2022    ECHOCARDIOGRAM REPORT   Patient Name:   Megan [name] Date of Exam: 06/03/2022 Medical Rec #:  [id number]        Height:       60.0 in Accession #:    [id number]       Weight:       219.4 lb Date of Birth:  02-01-1946        BSA:          1.941 m Patient Age:    40 years         BP:           117/46 mmHg Patient Gender: F                HR:           64 bpm. Exam Location:  Inpatient Procedure: 2D Echo, Cardiac Doppler, Color Doppler and Intracardiac            Opacification Agent Indications:    stroke  History:        Patient has no prior history of Echocardiogram examinations.                 CHF, Pacemaker, Arrythmias:Atrial Fibrillation; Risk                 Factors:Hypertension and Dyslipidemia.  Sonographer:    Phineas Douglas Referring Phys: [phone] Doneen Ollinger CALDWELL POWELL JR IMPRESSIONS  1. Left ventricular ejection fraction, by estimation, is 60 to 65%. The left ventricle has normal function. The left ventricle has no regional wall motion abnormalities. Left ventricular diastolic function could not be evaluated.  2. Right ventricular systolic function is normal. The right ventricular size is normal. There is mildly elevated pulmonary artery systolic pressure. The estimated right ventricular systolic pressure is [id number] mmHg.  3. Left atrial size was mild to moderately dilated.  4. Right atrial size was mildly dilated.  5. The mitral valve is normal in structure. Trivial mitral valve regurgitation. No evidence of mitral stenosis.  6. The aortic valve is tricuspid. There is mild calcification of the aortic valve. Aortic valve regurgitation is not visualized. Aortic valve sclerosis/calcification is present,  without any evidence of aortic stenosis.  7. The inferior vena cava is normal in size with greater than 50% respiratory variability, suggesting right atrial pressure of 3 mmHg. FINDINGS  Left Ventricle: Left ventricular ejection fraction, by estimation, is 60 to 65%. The left ventricle has normal function. The left ventricle has no regional wall motion abnormalities. The left ventricular internal cavity size was normal in size. There is  no left ventricular hypertrophy. Left ventricular diastolic function could not be evaluated due to atrial fibrillation. Left ventricular diastolic function could not be evaluated. Right Ventricle: The right ventricular size is normal. No increase in right ventricular wall thickness. Right ventricular systolic function is normal. There is mildly elevated pulmonary artery systolic pressure. The tricuspid regurgitant velocity is 2.95  m/s, and with an assumed right atrial pressure of 3 mmHg, the estimated right ventricular systolic pressure is [id number] mmHg. Left Atrium: Left atrial size was mild to moderately dilated. Right Atrium: Right atrial size was mildly dilated. Pericardium: There is no evidence of pericardial effusion. Mitral Valve: The mitral valve is normal in structure. Mild to moderate mitral annular calcification. Trivial mitral valve regurgitation. No evidence of mitral valve  stenosis. Tricuspid Valve: The tricuspid valve is normal in structure. Tricuspid valve regurgitation is mild. Aortic Valve: The aortic valve is tricuspid. There is mild calcification of the aortic valve. Aortic valve regurgitation is not visualized. Aortic valve sclerosis/calcification is present, without any evidence of aortic stenosis. Pulmonic Valve: The pulmonic valve was normal in structure. Pulmonic valve regurgitation is not visualized. Aorta: The aortic root and ascending aorta are structurally normal, with no evidence of dilitation. Venous: The inferior vena cava is normal in size with greater  than 50% respiratory variability, suggesting right atrial pressure of 3 mmHg. IAS/Shunts: No atrial level shunt detected by color flow Doppler. Additional Comments: Delsin Copen device lead is visualized in the right ventricle.  LEFT VENTRICLE PLAX 2D LVIDd:         3.60 cm   Diastology LVIDs:         2.20 cm   LV e' medial:    5.44 cm/s LV PW:         1.00 cm   LV E/e' medial:  20.4 LV IVS:        1.00 cm   LV e' lateral:   6.09 cm/s LVOT diam:     1.90 cm   LV E/e' lateral: 18.2 LV SV:         41 LV SV Index:   21 LVOT Area:     2.84 cm  RIGHT VENTRICLE            IVC RV Basal diam:  4.20 cm    IVC diam: 1.90 cm RV S prime:     8.38 cm/s TAPSE (M-mode): 1.3 cm LEFT ATRIUM             Index        RIGHT ATRIUM           Index LA diam:        4.00 cm 2.06 cm/m   RA Area:     18.60 cm LA Vol (A2C):   61.3 ml 31.58 ml/m  RA Volume:   45.00 ml  23.18 ml/m LA Vol (A4C):   80.9 ml 41.67 ml/m LA Biplane Vol: 73.6 ml 37.91 ml/m  AORTIC VALVE LVOT Vmax:   63.40 cm/s LVOT Vmean:  42.500 cm/s LVOT VTI:    0.143 m  AORTA Ao Root diam: 2.70 cm Ao Asc diam:  2.70 cm MITRAL VALVE                TRICUSPID VALVE MV Area (PHT): 3.21 cm     TR Peak grad:   34.8 mmHg MV Decel Time: 236 msec     TR Vmax:        295.00 cm/s MV E velocity: 111.00 cm/s                             SHUNTS                             Systemic VTI:  0.14 m                             Systemic Diam: 1.90 cm Dani Gobble Croitoru MD Electronically signed by Sanda Klein MD Signature Date/Time: 06/03/2022/2:30:49 PM    Final    CT HEAD WO CONTRAST (5MM)  Result Date: 06/03/2022 CLINICAL DATA:  Remote subdural hematoma, need to start anticoagulation  EXAM: CT HEAD WITHOUT CONTRAST TECHNIQUE: Contiguous axial images were obtained from the base of the skull through the vertex without intravenous contrast. RADIATION DOSE REDUCTION: This exam was performed according to the departmental dose-optimization program which includes automated exposure control, adjustment of the mA  and/or kV according to patient size and/or use of iterative reconstruction technique. COMPARISON:  11/21/2021 FINDINGS: Brain: Ill-defined hypodensity in the right frontotemporal region appears new from the prior exam (series 3, image 15), with some loss of gray-white differentiation in this area, and possible hypodensity in the right basal ganglia. No evidence of acute hemorrhage, mass, mass effect, or midline shift. No hydrocephalus or extra-axial fluid collection. Periventricular white matter changes, likely the sequela of chronic small vessel ischemic disease. Vascular: No hyperdense vessel. Skull: Negative for fracture or focal lesion. Sinuses/Orbits: No acute finding. Other: The mastoid air cells are well aerated. IMPRESSION: Ill-defined hypodensity in the right frontotemporal region, with some loss of gray-white differentiation in this area, and possible hypodensity in the right basal ganglia, concerning for acute infarct. MRI without contrast is recommended for further evaluation. These results were called by telephone at the time of interpretation on 06/03/2022 at 3:23 am to provider OPYD , who verbally acknowledged these results. Electronically Signed   By: Merilyn Baba M.D.   On: 06/03/2022 03:24   PERIPHERAL VASCULAR CATHETERIZATION  Result Date: 06/02/2022 Images from the original result were not included. Patient name: Megan [name] MRN: [id number] DOB: February 17, 1946 Sex: female 06/02/2022 Pre-operative Diagnosis: Left lower extremity acute limb ischemia [place] 1 Post-operative diagnosis:  Same Surgeon:  Broadus John, MD Procedure Performed: 1.  Ultrasound-guided micropuncture access of the right common femoral artery 2.  Aortogram 3.  Secondary cannulation, left lower extremity angiogram 4.  Mechanical thrombectomy using 7 French penumbra lightening system of the popliteal artery, anterior tibial artery, tibioperoneal trunk, peroneal artery. 5.  Moderate sedation 82 minutes, contrast volume 130  mL Indications: Patient is Kameran Mcneese 77 year old female who presented with new onset left lower extremity pain.  CT scan demonstrated embolus in the left popliteal artery extending into the tibioperoneal trunk.  No flow distally.  English had rest pain, no sensory or motor deficits.  She was Zakar Brosch poor surgical candidate.  After discussing the risk and benefits of left lower extremity angiogram with percutaneous mechanical thrombectomy, Mekala elected to proceed. Etiology believed to be Shley Dolby-fib embolus after discontinuing anticoagulation due to fall with head bleed last year. Findings: No flow-limiting stenosis appreciated in the aortoiliac segments bilaterally On the left, widely patent common femoral artery, SFA, profunda.  The P2 segment of the popliteal artery demonstrates Cearra Portnoy flush occlusion with no named outflow vessels.  The anterior tibial artery reconstituted at the mid tibia and the posterior tibial artery appears to reconstitute via Zaley Talley series of collaterals at the distal tibia.  Procedure:  The patient was identified in the holding area and taken to room 8.  The patient was then placed supine on the table and prepped and draped in the usual sterile fashion.  Moyses Pavey time out was called.  Ultrasound was used to evaluate the right common femoral artery.  It was patent .  Keidra Withers digital ultrasound image was acquired.  Lyndsay Talamante micropuncture needle was used to access the right common femoral artery under ultrasound guidance.  An 018 wire was advanced without resistance and Myrene Bougher micropuncture sheath was placed.  The 018 wire was removed and Kamdyn Colborn benson wire was placed.  The micropuncture sheath was exchanged for Hershy Flenner 5 french sheath.  An omniflush catheter was advanced over the wire to the level of L-1.  An abdominal angiogram was obtained.  Next, using the omniflush catheter and Jary Louvier benson wire, the aortic bifurcation was crossed and the catheter was placed into theleft external iliac artery and left runoff was obtained. I elected to intervene on the  left popliteal occlusion.  The patient was heparinized with 8000 units IV heparin and Thelia Tanksley 7 [place] by 60 cm sheath was brought onto the field.  There was some difficulty bringing the sheath up and over the aortic bifurcation, however was able to do so with the use of an Amplatz wire.  This wire was then switched out and Nashawn Hillock series of wires and catheters were used to traverse the popliteal artery lesion.  The wire preferentially chose the anterior tibial artery, therefore I elected to begin percutaneous mechanical thrombectomy from the P2 segment of the popliteal artery into the anterior tibial artery using the 7 French penumbra lightening system.  This took Vida Nicol considerable mount time, however I was able to retrieve what appeared to be Tierre Netto-fib embolus which had an acute component on the more proximal portion of the embolus. Once the anterior tibial artery was recanalized, I moved to the tibioperoneal trunk.  I was able to pass wire into the peroneal artery, and suction thrombectomy of the tibioperoneal trunk followed into the peroneal artery.  Follow-up angiography demonstrated excellent result with reconstitution of the TPT trunk and peroneal artery.  I could not find the ostia of the posterior tibial artery, and therefore elected to terminate the case. The right-sided access site was closed with the use of Daiton Cowles ProGlide device without issue.  I was aware that another device would result in failure, especially due to the patient's morbid obesity. Being that this is an Farren Nelles-fib embolus, will need to discuss therapeutic anticoagulation.  I realize this was discontinued due to Novali Vollman fall, however risk and benefits discussion needs to occur. Will heparinize at the 8-hour mark, and keep bedrest. Impression: Successful mechanical thrombectomy of the popliteal artery, anterior tibial artery, tibioperoneal trunk, peroneal artery. [name], MD [facility] Office: [phone]        Scheduled  Meds:  atorvastatin  80 mg Oral Daily   clopidogrel  75 mg Oral Daily   diltiazem  240 mg Oral Daily   furosemide  20 mg Oral Daily   levETIRAcetam  750 mg Oral BID   levothyroxine  150 mcg Oral Q0600   metoprolol tartrate  100 mg Oral BID   pantoprazole  40 mg Oral Q1200   senna-docusate  1 tablet Oral BID   sodium chloride flush  3 mL Intravenous Q12H   Continuous Infusions:  sodium chloride     heparin 1,000 Units/hr (06/04/22 1006)     LOS: 4 days    Time spent: over 30 min    Fayrene Helper, MD [facility]   To contact the attending provider between 7A-7P or the covering provider during after hours 7P-7A, please log into the web site [email or web address] and access using universal [place] password for that web site. If you do not have the password, please call the hospital operator.  06/04/2022, 1:37 PM

## 2022-06-04 NOTE — Progress Notes (Signed)
PHARMACIST LIPID MONITORING   Megan [name] is a 77 y.o. female admitted on 05/31/2022 with left leg pain.  Pharmacy has been consulted to optimize lipid-lowering therapy with the indication of secondary prevention for clinical ASCVD.  Recent Labs:  Lipid Panel (last 6 months):   Lab Results  Component Value Date   CHOL 209 (H) 06/04/2022   TRIG 131 06/04/2022   HDL 48 06/04/2022   CHOLHDL 4.4 06/04/2022   VLDL 26 06/04/2022   LDLCALC 135 (H) 06/04/2022    Hepatic function panel (last 6 months):   No results found for: "AST", "ALT", "ALKPHOS", "BILITOT", "BILIDIR", "IBILI"  SCr (since admission):   Serum creatinine: 0.8 mg/dL 06/03/22 0703 Estimated creatinine clearance: 62.1 mL/min  Current therapy and lipid therapy tolerance Current lipid-lowering therapy: atorvastatin 40 mg (of note, on medication list but patient reported not taking - when asked no side effects or issues when had been taking the medication) Previous lipid-lowering therapies (if applicable): atorvastatin 40-80 mg  Documented or reported allergies or intolerances to lipid-lowering therapies (if applicable): None  Assessment:   Patient agrees with changes to lipid-lowering therapy - given unclear timing of when stopped atorvastatin, will restart and increase current dose to 80 mg   Plan:    1.Statin intensity (high intensity recommended for all patients regardless of the LDL):  Add or increase statin to high intensity.  2.Add ezetimibe (if any one of the following):   Not indicated at this time.  3.Refer to lipid clinic:   No  4.Follow-up with:  Primary care provider - Sharion Balloon, FNP  5.Follow-up labs after discharge:  Changes in lipid therapy were made. Check a lipid panel in 8-12 weeks then annually.     Antonietta Jewel, PharmD, [place] Clinical Pharmacist  Phone: [phone] 06/04/2022 8:18 AM  Please check AMION for all [place] phone numbers After 10:00 PM, call [place] [phone]

## 2022-06-04 NOTE — Progress Notes (Addendum)
STROKE TEAM PROGRESS NOTE   INTERVAL HISTORY Her son is at the bedside.   Will need to transition to oral anticoagulation prior to discharge. She is currently on heparin IV. Can transition to eliquis from heparin.   Vitals:   06/03/22 2120 06/04/22 0012 06/04/22 0529 06/04/22 0735  BP: (!) 119/32 (!) 129/59 (!) 114/59 116/60  Pulse: 62 (!) 106 74 86  Resp:  '20 20 20  '$ Temp:  98.1 F (36.7 C) 98 F (36.7 C) 97.6 F (36.4 C)  TempSrc:  Oral Oral Oral  SpO2:  93% 97% 94%  Weight:  101.4 kg     CBC:  Recent Labs  Lab 06/03/22 0704 06/04/22 0059  WBC 13.1* 11.8*  HGB 12.0 11.3*  HCT 35.5* 32.9*  MCV 92.2 91.4  PLT 301 [id number]   Basic Metabolic Panel:  Recent Labs  Lab 06/02/22 0836 06/02/22 1607 06/03/22 0703  NA 134* 123* 134*  K 4.5 3.8 4.6  CL 102 90* 102  CO2 18*  --  19*  GLUCOSE 91 98 105*  BUN '9 9 11  '$ CREATININE 0.94 0.50 0.80  CALCIUM 8.7*  --  8.3*   Lipid Panel:  Recent Labs  Lab 06/04/22 0059  CHOL 209*  TRIG 131  HDL 48  CHOLHDL 4.4  VLDL 26  LDLCALC 135*   HgbA1c:  Recent Labs  Lab 06/03/22 0717  HGBA1C 5.5   Urine Drug Screen: No results for input(s): "LABOPIA", "COCAINSCRNUR", "LABBENZ", "AMPHETMU", "THCU", "LABBARB" in the last 168 hours.  Alcohol Level No results for input(s): "ETH" in the last 168 hours.  IMAGING past 24 hours ECHOCARDIOGRAM COMPLETE  Result Date: 06/03/2022    ECHOCARDIOGRAM REPORT   Patient Name:   Megan [name] Date of Exam: 06/03/2022 Medical Rec #:  [id number]        Height:       60.0 in Accession #:    [id number]       Weight:       219.4 lb Date of Birth:  [birth year]        BSA:          1.941 m Patient Age:    77 years         BP:           117/46 mmHg Patient Gender: F                HR:           64 bpm. Exam Location:  Inpatient Procedure: 2D Echo, Cardiac Doppler, Color Doppler and Intracardiac            Opacification Agent Indications:    stroke  History:        Patient has no prior history of Echocardiogram  examinations.                 CHF, Pacemaker, Arrythmias:Atrial Fibrillation; Risk                 Factors:Hypertension and Dyslipidemia.  Sonographer:    Phineas Douglas Referring Phys: [phone] A CALDWELL POWELL JR IMPRESSIONS  1. Left ventricular ejection fraction, by estimation, is 60 to 65%. The left ventricle has normal function. The left ventricle has no regional wall motion abnormalities. Left ventricular diastolic function could not be evaluated.  2. Right ventricular systolic function is normal. The right ventricular size is normal. There is mildly elevated pulmonary artery systolic pressure. The estimated right ventricular systolic pressure is [id number] mmHg.  3.  Left atrial size was mild to moderately dilated.  4. Right atrial size was mildly dilated.  5. The mitral valve is normal in structure. Trivial mitral valve regurgitation. No evidence of mitral stenosis.  6. The aortic valve is tricuspid. There is mild calcification of the aortic valve. Aortic valve regurgitation is not visualized. Aortic valve sclerosis/calcification is present, without any evidence of aortic stenosis.  7. The inferior vena cava is normal in size with greater than 50% respiratory variability, suggesting right atrial pressure of 3 mmHg. FINDINGS  Left Ventricle: Left ventricular ejection fraction, by estimation, is 60 to 65%. The left ventricle has normal function. The left ventricle has no regional wall motion abnormalities. The left ventricular internal cavity size was normal in size. There is  no left ventricular hypertrophy. Left ventricular diastolic function could not be evaluated due to atrial fibrillation. Left ventricular diastolic function could not be evaluated. Right Ventricle: The right ventricular size is normal. No increase in right ventricular wall thickness. Right ventricular systolic function is normal. There is mildly elevated pulmonary artery systolic pressure. The tricuspid regurgitant velocity is 2.95  m/s, and with  an assumed right atrial pressure of 3 mmHg, the estimated right ventricular systolic pressure is [id number] mmHg. Left Atrium: Left atrial size was mild to moderately dilated. Right Atrium: Right atrial size was mildly dilated. Pericardium: There is no evidence of pericardial effusion. Mitral Valve: The mitral valve is normal in structure. Mild to moderate mitral annular calcification. Trivial mitral valve regurgitation. No evidence of mitral valve stenosis. Tricuspid Valve: The tricuspid valve is normal in structure. Tricuspid valve regurgitation is mild. Aortic Valve: The aortic valve is tricuspid. There is mild calcification of the aortic valve. Aortic valve regurgitation is not visualized. Aortic valve sclerosis/calcification is present, without any evidence of aortic stenosis. Pulmonic Valve: The pulmonic valve was normal in structure. Pulmonic valve regurgitation is not visualized. Aorta: The aortic root and ascending aorta are structurally normal, with no evidence of dilitation. Venous: The inferior vena cava is normal in size with greater than 50% respiratory variability, suggesting right atrial pressure of 3 mmHg. IAS/Shunts: No atrial level shunt detected by color flow Doppler. Additional Comments: A device lead is visualized in the right ventricle.  LEFT VENTRICLE PLAX 2D LVIDd:         3.60 cm   Diastology LVIDs:         2.20 cm   LV e' medial:    5.44 cm/s LV PW:         1.00 cm   LV E/e' medial:  20.4 LV IVS:        1.00 cm   LV e' lateral:   6.09 cm/s LVOT diam:     1.90 cm   LV E/e' lateral: 18.2 LV SV:         41 LV SV Index:   21 LVOT Area:     2.84 cm  RIGHT VENTRICLE            IVC RV Basal diam:  4.20 cm    IVC diam: 1.90 cm RV S prime:     8.38 cm/s TAPSE (M-mode): 1.3 cm LEFT ATRIUM             Index        RIGHT ATRIUM           Index LA diam:        4.00 cm 2.06 cm/m   RA Area:     18.60 cm LA  Vol [facility]):   61.3 ml 31.58 ml/m  RA Volume:   45.00 ml  23.18 ml/m LA Vol (A4C):   80.9 ml 41.67  ml/m LA Biplane Vol: 73.6 ml 37.91 ml/m  AORTIC VALVE LVOT Vmax:   63.40 cm/s LVOT Vmean:  42.500 cm/s LVOT VTI:    0.143 m  AORTA Ao Root diam: 2.70 cm Ao Asc diam:  2.70 cm MITRAL VALVE                TRICUSPID VALVE MV Area (PHT): 3.21 cm     TR Peak grad:   34.8 mmHg MV Decel Time: 236 msec     TR Vmax:        295.00 cm/s MV E velocity: 111.00 cm/s                             SHUNTS                             Systemic VTI:  0.14 m                             Systemic Diam: 1.90 cm Dani Gobble Croitoru MD Electronically signed by Sanda Klein MD Signature Date/Time: 06/03/2022/2:30:49 PM    Final    VAS US CAROTID  Result Date: 06/03/2022 Carotid Arterial Duplex Study Patient Name:  CHATOYA STENGER  Date of Exam:   06/03/2022 Medical Rec #: [id number]         Accession #:    [id number] Date of Birth: [birth year]         Patient Gender: F Patient Age:   40 years Exam Location:  [facility] Procedure:      VAS US CAROTID Referring Phys: A POWELL JR --------------------------------------------------------------------------------  Indications:  CVA. Risk Factors: Hypertension, hyperlipidemia, past history of smoking. Limitations   Today's exam was limited due to the body habitus of the patient               and the patient's respiratory variation. Performing Technologist: Darlin Coco RDMS, RVT  Examination Guidelines: A complete evaluation includes B-mode imaging, spectral Doppler, color Doppler, and power Doppler as needed of all accessible portions of each vessel. Bilateral testing is considered an integral part of a complete examination. Limited examinations for reoccurring indications may be performed as noted.  Right Carotid Findings: +----------+--------+--------+--------+------------------+--------+           PSV cm/sEDV cm/sStenosisPlaque DescriptionComments +----------+--------+--------+--------+------------------+--------+ CCA Prox  44      6                                           +----------+--------+--------+--------+------------------+--------+ CCA Distal36      6                                          +----------+--------+--------+--------+------------------+--------+ ICA Prox  35      7       1-39%   heterogenous               +----------+--------+--------+--------+------------------+--------+ ICA Distal65      14                                         +----------+--------+--------+--------+------------------+--------+  ECA       46                                                 +----------+--------+--------+--------+------------------+--------+ +----------+--------+-------+----------------+-------------------+           PSV cm/sEDV cmsDescribe        Arm Pressure (mmHG) +----------+--------+-------+----------------+-------------------+ Subclavian120            Multiphasic, WNL                    +----------+--------+-------+----------------+-------------------+ +---------+--------+--+--------+-+---------+ VertebralPSV cm/s23EDV cm/s4Antegrade +---------+--------+--+--------+-+---------+  Left Carotid Findings: +----------+--------+--------+--------+------------------+--------+           PSV cm/sEDV cm/sStenosisPlaque DescriptionComments +----------+--------+--------+--------+------------------+--------+ CCA Prox  57      8                                          +----------+--------+--------+--------+------------------+--------+ CCA Distal58      12                                         +----------+--------+--------+--------+------------------+--------+ ICA Prox  52      13      1-39%   heterogenous               +----------+--------+--------+--------+------------------+--------+ ICA Distal82      19                                         +----------+--------+--------+--------+------------------+--------+ ECA       62      9                                           +----------+--------+--------+--------+------------------+--------+ +----------+--------+--------+----------------+-------------------+           PSV cm/sEDV cm/sDescribe        Arm Pressure (mmHG) +----------+--------+--------+----------------+-------------------+ [id number]             Multiphasic, WNL                    +----------+--------+--------+----------------+-------------------+ +---------+--------+--+--------+-+---------+ VertebralPSV cm/s30EDV cm/s5Antegrade +---------+--------+--+--------+-+---------+   Summary: Right Carotid: Velocities in the right ICA are consistent with a 1-39% stenosis. Left Carotid: Velocities in the left ICA are consistent with a 1-39% stenosis. Vertebrals:  Bilateral vertebral arteries demonstrate antegrade flow. Subclavians: Normal flow hemodynamics were seen in bilateral subclavian              arteries. *See table(s) above for measurements and observations.     Preliminary     PHYSICAL EXAM  Physical Exam  Constitutional: Appears well-developed and well-nourished.   Cardiovascular: Normal rate and regular rhythm.  Respiratory: Effort normal, non-labored breathing  Neuro: Mental Status: Patient is awake, alert, oriented to person, place, month, year, and situation. Patient is able to give a clear and coherent history. No signs of aphasia or neglect Cranial Nerves: II: Visual Fields are full. Pupils are equal, round, and reactive to light.   III,IV, VI: EOMI  without ptosis or diploplia.  V: Facial sensation is symmetric to temperature VII: Facial movement is symmetric resting and smiling VIII: Hearing is intact to voice X: Palate elevates symmetrically XI: Shoulder shrug is symmetric. XII: Tongue protrudes midline without atrophy or fasciculations.  Motor: Tone is normal. Bulk is normal. RUE 5/5 LUE 4-/5 with pronator drift Bilateral lower extremities 3/5  Sensory: Sensation is symmetric to light touch in the arms and  legs. Cerebellar: Unable to complete HKS due to weakness. No gross ataxia in bilateral upper extremities Slowed movements with the left hand    ASSESSMENT/PLAN Megan [name] is a 76 y.o. female with history of atrial fibrillation (not on anticoagulation due to history of multiple falls and subdural hematomas), second-degree AV block with non-MRI compatible pacemaker, hypertension, anxiety, hypothyroidism, COPD who was admitted to the hospital for 3/4 with complaints of left leg pain.   Stroke:  Right MCA infarct, etiology likely due to afib not on AC   CT Head- ill-defined hypodensity in the right frontotemporal region, with some loss of gray-white differentiation in this area, and possible hypodensity in the right basal ganglia, concerning for acute infarct.  CT repeat Evolving Right MCA territory infarct (up to moderate size, predominantly posterior division). But also evidence of recent small left Cerebellum SCA territory infarcts. No  hemorrhagic transformation or significant mass effect.  CTA head & neck negative for large vessel occlusion, but positive for absent distal right MCA branches in the posterior division. Also mild to moderate bilateral Supraclinoid ICA plaque, non dominant distal vertebral artery and stenosis. Also evidence of cervical Right ICA  Fibromuscular Dysplasia (FMD).  MRI not performed due to incompatible pacemaker Carotid Doppler  1-39% bilateral ICA stenosis, normal flow dynamics in subclavians. Vertebral arteries with antegrade flow.  2D Echo EF 60-65%, LA mildly dilated  LDL 135 HgbA1c 5.5 VTE prophylaxis - heparin IV No antithrombotic prior to admission, now on heparin IV. Recommend DOAC prior to discharge. Currently also on plavix per [facility]VS.  Therapy recommendations:  Pending Disposition:  Pending  PVD w/ occlusion of the distal left popliteal artery Admitted for left leg pain CTA abdominal aorta and bilateral iliofemoral runoff w w/o contrast showing  acute appearing occlusion of the distal left poplital artery, posterior tibal artery ap Pears occluded proximally  S/p left lower extremity angiogram with mechanical thrombectomy on 3/6  Etiology believed to be A-fib [facility] on board Currently on heparin IV and plavix, further regimen per VVS  Atrial Fibrillation Rate control: Cardizem, metoprolol tartrate Not on AC at home due to traumatic SDH in 07/2021 Currently on heparin IV -> will transition to eliquis once appropriate  ?  Pneumonia CTA neck showed right upper lobe consolidation, favor pneumonia but lung mass difficult to exclude CXR right upper lobe consolidative opacity, possible pneumonia CT chest pending Management per primary team  Seizure disorder Continue home levetiracetam '750mg'$  twice daily  Hypertension Home meds:  metoprolol tartrate  Stable On home Cardizem and metoprolol Long-term BP goal normotensive  Hyperlipidemia Home meds:  Atorvastatin 40, resumed in hospital LDL 135, goal < 70 Continue statin at discharge  Other Stroke Risk Factors Advanced Age >/= 53  Former cigarette smoker Obesity, Body mass index is 43.66 kg/m., BMI >/= 30 associated with increased stroke risk, recommend weight loss, diet and exercise as appropriate  Obstructive sleep apnea, on CPAP at home Saw Dr. Brett Fairy in 2017 for cpap  Other Active Problems Lymphedema Hypothyroidism Hx of SDH from a fall in 07/2021 while  on coumadin 2nd degree AV block with PPM (not MRI compatible)  Hospital day # 4  ATTENDING NOTE: I reviewed above note and agree with the assessment and plan. Pt was seen and examined.   Son at bedside.  Patient coming back from CTA head and neck, lying in bed, awake alert orientated x 3, follows simple commands, no aphasia, mild dysarthria.  No gaze palsy, visual field full, facial symmetry.  Left upper extremity drift to bed within 10 seconds, right upper extremity at least 4/5.  Bilateral lower extremity 2+/5.   Sensation symmetrical, bilateral finger-nose grossly intact, left slow.  Etiology of patient's stroke likely due to A-fib not on [facility].  Her PVD this time also likely due to A-fib status post mechanical thrombectomy by VVS.  Currently on heparin IV, Plavix and statin per [facility].  Recommend heparin IV switch to DOAC once appropriate and before discharge.  Found to have right upper lobe consolidation, chest CT pending, fever pneumonia over mass lesion.  PT and OT recommend SNF.  For detailed assessment and plan, please refer to above/below as I have made changes wherever appropriate.   Neurology will sign off. Please call with questions. Pt will follow up with stroke clinic NP at [facility] in about 4 weeks. Thanks for the consult.  Rosalin Hawking, MD PhD Stroke Neurology 06/04/2022 3:18 PM   To contact Stroke Continuity provider, please refer to [email or web address] After hours, contact General Neurology

## 2022-06-05 DIAGNOSIS — I6389 Other cerebral infarction: Secondary | ICD-10-CM | POA: Diagnosis not present

## 2022-06-05 DIAGNOSIS — M79605 Pain in left leg: Secondary | ICD-10-CM | POA: Diagnosis not present

## 2022-06-05 DIAGNOSIS — I10 Essential (primary) hypertension: Secondary | ICD-10-CM | POA: Diagnosis not present

## 2022-06-05 DIAGNOSIS — F4320 Adjustment disorder, unspecified: Secondary | ICD-10-CM | POA: Diagnosis not present

## 2022-06-05 DIAGNOSIS — I4821 Permanent atrial fibrillation: Secondary | ICD-10-CM | POA: Diagnosis not present

## 2022-06-05 DIAGNOSIS — R1311 Dysphagia, oral phase: Secondary | ICD-10-CM | POA: Diagnosis not present

## 2022-06-05 DIAGNOSIS — I5032 Chronic diastolic (congestive) heart failure: Secondary | ICD-10-CM | POA: Diagnosis not present

## 2022-06-05 DIAGNOSIS — E119 Type 2 diabetes mellitus without complications: Secondary | ICD-10-CM | POA: Diagnosis not present

## 2022-06-05 DIAGNOSIS — R4189 Other symptoms and signs involving cognitive functions and awareness: Secondary | ICD-10-CM | POA: Diagnosis not present

## 2022-06-05 DIAGNOSIS — M1991 Primary osteoarthritis, unspecified site: Secondary | ICD-10-CM | POA: Diagnosis present

## 2022-06-05 DIAGNOSIS — K5900 Constipation, unspecified: Secondary | ICD-10-CM | POA: Diagnosis not present

## 2022-06-05 DIAGNOSIS — R5383 Other fatigue: Secondary | ICD-10-CM | POA: Diagnosis not present

## 2022-06-05 DIAGNOSIS — I7091 Generalized atherosclerosis: Secondary | ICD-10-CM | POA: Diagnosis not present

## 2022-06-05 DIAGNOSIS — J449 Chronic obstructive pulmonary disease, unspecified: Secondary | ICD-10-CM | POA: Diagnosis not present

## 2022-06-05 DIAGNOSIS — G40909 Epilepsy, unspecified, not intractable, without status epilepticus: Secondary | ICD-10-CM | POA: Diagnosis not present

## 2022-06-05 DIAGNOSIS — I441 Atrioventricular block, second degree: Secondary | ICD-10-CM | POA: Diagnosis not present

## 2022-06-05 DIAGNOSIS — L89892 Pressure ulcer of other site, stage 2: Secondary | ICD-10-CM | POA: Diagnosis not present

## 2022-06-05 DIAGNOSIS — E039 Hypothyroidism, unspecified: Secondary | ICD-10-CM | POA: Diagnosis not present

## 2022-06-05 DIAGNOSIS — S065XAD Traumatic subdural hemorrhage with loss of consciousness status unknown, subsequent encounter: Secondary | ICD-10-CM | POA: Diagnosis not present

## 2022-06-05 DIAGNOSIS — E440 Moderate protein-calorie malnutrition: Secondary | ICD-10-CM | POA: Diagnosis not present

## 2022-06-05 DIAGNOSIS — F419 Anxiety disorder, unspecified: Secondary | ICD-10-CM | POA: Diagnosis not present

## 2022-06-05 DIAGNOSIS — M79606 Pain in leg, unspecified: Secondary | ICD-10-CM | POA: Diagnosis present

## 2022-06-05 DIAGNOSIS — R609 Edema, unspecified: Secondary | ICD-10-CM | POA: Diagnosis not present

## 2022-06-05 DIAGNOSIS — Z7401 Bed confinement status: Secondary | ICD-10-CM | POA: Diagnosis not present

## 2022-06-05 DIAGNOSIS — M6281 Muscle weakness (generalized): Secondary | ICD-10-CM | POA: Diagnosis not present

## 2022-06-05 DIAGNOSIS — K219 Gastro-esophageal reflux disease without esophagitis: Secondary | ICD-10-CM | POA: Diagnosis not present

## 2022-06-05 DIAGNOSIS — I679 Cerebrovascular disease, unspecified: Secondary | ICD-10-CM | POA: Diagnosis not present

## 2022-06-05 DIAGNOSIS — R5381 Other malaise: Secondary | ICD-10-CM | POA: Diagnosis not present

## 2022-06-05 DIAGNOSIS — B351 Tinea unguium: Secondary | ICD-10-CM | POA: Diagnosis not present

## 2022-06-05 DIAGNOSIS — I82432 Acute embolism and thrombosis of left popliteal vein: Secondary | ICD-10-CM | POA: Diagnosis not present

## 2022-06-05 DIAGNOSIS — I63412 Cerebral infarction due to embolism of left middle cerebral artery: Secondary | ICD-10-CM | POA: Diagnosis not present

## 2022-06-05 DIAGNOSIS — R000 Tachycardia, unspecified: Secondary | ICD-10-CM | POA: Diagnosis not present

## 2022-06-05 DIAGNOSIS — Z950 Presence of cardiac pacemaker: Secondary | ICD-10-CM | POA: Diagnosis not present

## 2022-06-05 DIAGNOSIS — R112 Nausea with vomiting, unspecified: Secondary | ICD-10-CM | POA: Diagnosis not present

## 2022-06-05 DIAGNOSIS — I69391 Dysphagia following cerebral infarction: Secondary | ICD-10-CM | POA: Diagnosis not present

## 2022-06-05 DIAGNOSIS — I69319 Unspecified symptoms and signs involving cognitive functions following cerebral infarction: Secondary | ICD-10-CM | POA: Diagnosis not present

## 2022-06-05 DIAGNOSIS — E785 Hyperlipidemia, unspecified: Secondary | ICD-10-CM | POA: Diagnosis not present

## 2022-06-05 DIAGNOSIS — Z23 Encounter for immunization: Secondary | ICD-10-CM | POA: Diagnosis not present

## 2022-06-05 DIAGNOSIS — J984 Other disorders of lung: Secondary | ICD-10-CM | POA: Diagnosis present

## 2022-06-05 DIAGNOSIS — R531 Weakness: Secondary | ICD-10-CM | POA: Diagnosis not present

## 2022-06-05 DIAGNOSIS — I509 Heart failure, unspecified: Secondary | ICD-10-CM | POA: Diagnosis not present

## 2022-06-05 DIAGNOSIS — I63542 Cerebral infarction due to unspecified occlusion or stenosis of left cerebellar artery: Secondary | ICD-10-CM | POA: Diagnosis present

## 2022-06-05 DIAGNOSIS — L89896 Pressure-induced deep tissue damage of other site: Secondary | ICD-10-CM | POA: Diagnosis not present

## 2022-06-05 DIAGNOSIS — E782 Mixed hyperlipidemia: Secondary | ICD-10-CM | POA: Diagnosis not present

## 2022-06-05 DIAGNOSIS — Z09 Encounter for follow-up examination after completed treatment for conditions other than malignant neoplasm: Secondary | ICD-10-CM | POA: Diagnosis not present

## 2022-06-05 DIAGNOSIS — I739 Peripheral vascular disease, unspecified: Secondary | ICD-10-CM | POA: Diagnosis not present

## 2022-06-05 LAB — BASIC METABOLIC PANEL
Anion gap: 12 (ref 5–15)
BUN: 7 mg/dL — ABNORMAL LOW (ref 8–23)
CO2: 20 mmol/L — ABNORMAL LOW (ref 22–32)
Calcium: 8.4 mg/dL — ABNORMAL LOW (ref 8.9–10.3)
Chloride: 100 mmol/L (ref 98–111)
Creatinine, Ser: 0.99 mg/dL (ref 0.44–1.00)
GFR, Estimated: 58 mL/min — ABNORMAL LOW (ref >60)
Glucose, Bld: 88 mg/dL (ref 70–99)
Potassium: 3.8 mmol/L (ref 3.5–5.1)
Sodium: 132 mmol/L — ABNORMAL LOW (ref 135–145)

## 2022-06-05 LAB — CBC
HCT: 34.9 % — ABNORMAL LOW (ref 36.0–46.0)
Hemoglobin: 11.3 g/dL — ABNORMAL LOW (ref 12.0–15.0)
MCH: 30.1 pg (ref 26.0–34.0)
MCHC: 32.4 g/dL (ref 30.0–36.0)
MCV: 93.1 fL (ref 80.0–100.0)
Platelets: 268 10*3/uL (ref 150–400)
RBC: 3.75 MIL/uL — ABNORMAL LOW (ref 3.87–5.11)
RDW: 15.6 % — ABNORMAL HIGH (ref 11.5–15.5)
WBC: 10.6 10*3/uL — ABNORMAL HIGH (ref 4.0–10.5)
nRBC: 0 % (ref 0.0–0.2)

## 2022-06-05 LAB — MAGNESIUM: Magnesium: 1.8 mg/dL (ref 1.7–2.4)

## 2022-06-05 LAB — PHOSPHORUS: Phosphorus: 3.3 mg/dL (ref 2.5–4.6)

## 2022-06-05 MED ORDER — PANTOPRAZOLE SODIUM 40 MG PO TBEC: 40.0000 mg | DELAYED_RELEASE_TABLET | Freq: Every day | ORAL | Status: DC

## 2022-06-05 MED ORDER — CLOPIDOGREL BISULFATE 75 MG PO TABS: 75.0000 mg | ORAL_TABLET | Freq: Every day | ORAL | Status: DC

## 2022-06-05 MED ORDER — AMOXICILLIN 500 MG PO CAPS: 1000.0000 mg | ORAL_CAPSULE | Freq: Three times a day (TID) | ORAL | Status: DC

## 2022-06-05 MED ORDER — FUROSEMIDE 20 MG PO TABS: 20.0000 mg | ORAL_TABLET | Freq: Every day | ORAL | 0 refills | Status: AC

## 2022-06-05 MED ORDER — AZITHROMYCIN 500 MG PO TABS: 500.0000 mg | ORAL_TABLET | Freq: Every day | ORAL | 0 refills | Status: AC

## 2022-06-05 MED ORDER — AMOXICILLIN 500 MG PO CAPS: 1000.0000 mg | ORAL_CAPSULE | Freq: Three times a day (TID) | ORAL | 0 refills | Status: AC

## 2022-06-05 MED ORDER — AZITHROMYCIN 250 MG PO TABS
500.0000 mg | ORAL_TABLET | Freq: Every day | ORAL | Status: DC
  Administered 2022-06-05: 500 mg via ORAL
  Filled 2022-06-05: qty 2

## 2022-06-05 MED ORDER — APIXABAN 5 MG PO TABS: 5.0000 mg | ORAL_TABLET | Freq: Two times a day (BID) | ORAL | 0 refills | Status: DC

## 2022-06-05 NOTE — Progress Notes (Signed)
Report called to [facility] rehab. Megan [name] receiving report. [facility] to transport patient. All belongings and AVS with patient. Son is aware of discharge

## 2022-06-05 NOTE — TOC Transition Note (Signed)
Transition of Care [facility]) - CM/SW Discharge Note   Patient Details  Name: Megan [name] MRN: [id number] Date of Birth: Nov 18, 1945  Transition of Care [facility]) CM/SW Contact:  Bjorn Pippin, LCSW Phone Number: 06/05/2022, 12:42 PM   Clinical Narrative:    Patient will DC to: [facility]  Anticipated DC date: 06/05/2022 Family notified: Chrissie Noa (son) Transport by: Corey Harold   Per MD patient ready for DC to [facility]. RN, patient, patient's family, and facility notified of DC. Discharge Summary and FL2 sent to facility. RN to call report prior to discharge [phone]. DC packet on chart. Ambulance transport requested for patient.   CSW will sign off for now as social work intervention is no longer needed. Please consult [place] again if new needs arise.    Final next level of care: [place] Barriers to Discharge: No Barriers Identified   Patient Goals and CMS Choice      Discharge Placement                Patient chooses bed at: [facility]) Patient to be transferred to facility by: [facility] Name of family member notified: Chrissie Noa (son) Patient and family notified of of transfer: 06/05/22  Discharge Plan and Services Additional resources added to the After Visit Summary for                                       Social Determinants of Health (SDOH) Interventions SDOH Screenings   Food Insecurity: No Food Insecurity (06/01/2022)  Housing: Low Risk  (06/01/2022)  Transportation Needs: No Transportation Needs (06/01/2022)  Utilities: Not At Risk (06/01/2022)  Alcohol Screen: Low Risk  (12/14/2021)  Depression (PHQ2-9): Low Risk  (12/14/2021)  Recent Concern: Depression (PHQ2-9) - Medium Risk (10/22/2021)  Financial Resource Strain: Low Risk  (12/14/2021)  Physical Activity: Inactive (12/14/2021)  Social Connections: Socially Isolated (12/14/2021)  Stress: No Stress Concern Present (12/14/2021)  Tobacco Use: Medium Risk  (06/02/2022)     Readmission Risk Interventions     No data to display           Beckey Rutter, MSW, LCSWA, LCASA Transitions of Care  Clinical Social Worker I

## 2022-06-05 NOTE — Discharge Summary (Addendum)
Physician Discharge Summary  Megan [name] [id number] DOB: 05/31/1945 DOA: 05/31/2022  PCP: Megan Balloon, FNP  Admit date: 05/31/2022 Discharge date: 06/05/2022  Time spent: 40 minutes  Recommendations for Outpatient Follow-up:  Follow outpatient CBC/CMP  Follow plan for eliquis outpatient.  She's uninsured.  Please give 30 day card at discharge to SNF to bridge her until she's able to follow up with PCP to pursue patient assistance plan.  If this doesn't work out, she'll need to be transitioned to more affordable anticoagulation (I suspect warfarin will be the option). Continue plavix for now, follow with vascular for long term plan Needs repeat CT chest as outpatient to follow right upper lobe rounded opacity, neoplasm can't be excluded Follow right sided effusion outpatient as well Follow with neurology for stroke Seizure meds? Keppra on her med list, not clear she was takign this - defer to outpatient providers, continue for now Follow with neurology for concern for cognitive deficit, forgetfulness - dementia? Needs EP follow up for pacemaker battery Follow volume status  Discharge Diagnoses:  Principal Problem:   PVD (peripheral vascular disease) (Megan [name]) Active Problems:   HYPERTENSION, BENIGN   Leukocytosis   Acquired hypothyroidism   Mixed hyperlipidemia   Chronic congestive heart failure ([place])   Leg pain   Discharge Condition: stable  Diet recommendation: heart healthy  Filed Weights   06/03/22 0017 06/04/22 0012 06/05/22 0628  Weight: 99.5 kg 101.4 kg 101.2 kg    History of present illness:   Obese chronically ill female 77/F with history of second-degree AV block with PPM, permanent [name] not on anticoagulation following history of fall and SDH, hypertension, chronic diastolic CHF, COPD, anxiety, lymphedema, OSA, hypothyroidism presented to [name] ED with left knee/leg pain. Afebrile, vital signs stable, CT [name] of abdominal aorta with [name] noted  acute appearing occlusion in the distal left popliteal artery extending to trifurcation vessels, posterior tibial appeared occluded posteriorly .  She's now s/p mechanical thrombectomy by vascular.  Head CT showed stroke and neurology was consulted as well.  She'll discharge with plavix and eliquis.  Needs follow up regarding eliquis cost at [name] and outpatient.    Hospital Course:  Assessment and Plan:  Concern for Acute/Subacute Stroke CT head obtained given prior hx of SDH and was notable for findings concerning for acute infarct Her pacemaker is not MR compatible, confirmed with [employer] rep via cardmaster Neurology consult  Echo EF 60-65%, no RWMA, carotid US 1-39% bilaterally to carotids, antegrade flow in vertebral arteries bilaterally LDL 145, A1c 5.5 Atorvastatin, eliquis  CTA head/neck with evolving R MCA territory infarct, also recent small L cerebellum SCA territory infarcts.  Negative for LVO, but absent distal R MCA branches in posterior division.  Mild to moderate bilateral supraclinoid ICA plaque.  Non dominant distal vertebral artery stenosis.  R ICA FMD.   PT/OT/SLP   Peripheral vascular disease, occlusion of the distal left popliteal artery -CTA abdominal aorta and bilateral iliofemoral runoff W Wo contrast showing acute appearing occlusion of the distal left popliteal artery , posterior tibial artery appears occluded proximally. The PTA does reconstitute via collateral flow.  -s/p left lower extremity angiogram 3/6 by vascular, mechanical thrombectomy -appreciate vascular recs, likely afib embolus, needs anticoagulation -continuing plavix and statin - discharge with eliquis.  Will need to enroll in pharmacy assistance program for eliquis after discharge from SNF.  Needs close follow up. -PT eval, very poor functional status at baseline mostly nonambulatory, stays in recliner all day and  night   Right Upper Lobe Consolidation  Small Right Effusion CT concerning for  infectious/inflammatory changes - will need follow up short term CT as an outpatient Will discharge with amoxicillin/azithromycin, though she doesn't have any obvious symptoms of pneumonia (only [name] mild white count, currently afebrile on room air)   Leukocytosis Mild  Left knee pain -Suspect pain is primarily secondary to osteoarthritis and unrelated to distal PAD -X-ray with severe degenerative changes especially in the medial compartment, PT eval, Vicodin as needed   History of symptomatic second-degree AV block status post PPM -Due for pacemaker battery replacement -will need EP follow up outpatient   Permanent [name]-fib Not on anticoagulation following [name] fall and subdural hematoma in 5/23, was admitted at [name] then -Continue metoprolol and Cardizem -eliquis, discussed risks/benefits   Chronic HFpEF CT showing small right pleural effusion (care everywhere) Continue 20 mg lasix daily    COPD: Stable, no signs of acute exacerbation.   Hypothyroidism -Continue Synthroid   Seizure disorder -Keppra - wasn't taking this outpatient? Son not aware whether she was taking - will resume for now, should follow with outpatient providers whether to continue    Cognitive Deficit - follow with neurology outpatient, forgetful    Procedures:  3/6 Procedure Performed: 1.  Ultrasound-guided micropuncture access of the right common femoral artery 2.  Aortogram 3.  Secondary cannulation, left lower extremity angiogram 4.  Mechanical thrombectomy using 7 French penumbra lightening system to the popliteal artery, anterior tibial artery, tibioperoneal trunk, peroneal artery 5.  Moderate sedation 82 minutes, contrast volume 130 mL  Carotid US Summary:  Right Carotid: Velocities in the right ICA are consistent with [name] 1-39%  stenosis.   Left Carotid: Velocities in the left ICA are consistent with [name] 1-39%  stenosis.   Vertebrals: Bilateral vertebral arteries demonstrate antegrade flow.   Subclavians: Normal flow hemodynamics were seen in bilateral subclavian               arteries.   echo IMPRESSIONS     1. Left ventricular ejection fraction, by estimation, is 60 to 65%. The  left ventricle has normal function. The left ventricle has no regional  wall motion abnormalities. Left ventricular diastolic function could not  be evaluated.   2. Right ventricular systolic function is normal. The right ventricular  size is normal. There is mildly elevated pulmonary artery systolic  pressure. The estimated right ventricular systolic pressure is [id number] mmHg.   3. Left atrial size was mild to moderately dilated.   4. Right atrial size was mildly dilated.   5. The mitral valve is normal in structure. Trivial mitral valve  regurgitation. No evidence of mitral stenosis.   6. The aortic valve is tricuspid. There is mild calcification of the  aortic valve. Aortic valve regurgitation is not visualized. Aortic valve  sclerosis/calcification is present, without any evidence of aortic  stenosis.   7. The inferior vena cava is normal in size with greater than 50%  respiratory variability, suggesting right atrial pressure of 3 mmHg.    Consultations: Neurology Vascular surgery  Discharge Exam: Vitals:   06/05/22 0921 06/05/22 1147  BP: 122/65 (!) 124/41  Pulse: 74 72  Resp:  16  Temp:  (!) 97.3 F (36.3 C)  SpO2:  93%   Complains of chronic pain Otherwise no new complaints  General: No acute distress. Cardiovascular: irregularly irregular Lungs: unlabored Abdomen: Soft, nontender, nondistended Neurological: Alert. Moves all extremities 4. Cranial nerves II through XII grossly intact. Extremities: No  clubbing or cyanosis. No edema.  Discharge Instructions   Discharge Instructions     Ambulatory referral to Neurology   Complete by: As directed    Follow up with stroke clinic NP (Jessica Vanschaick or Cecille Rubin, if both not available, consider Zachery Dauer, or  Ahern) at [facility] in about 4 weeks. Thanks.   Call MD for:  difficulty breathing, headache or visual disturbances   Complete by: As directed    Call MD for:  extreme fatigue   Complete by: As directed    Call MD for:  hives   Complete by: As directed    Call MD for:  persistant dizziness or light-headedness   Complete by: As directed    Call MD for:  persistant nausea and vomiting   Complete by: As directed    Call MD for:  redness, tenderness, or signs of infection (pain, swelling, redness, odor or green/yellow discharge around incision site)   Complete by: As directed    Call MD for:  severe uncontrolled pain   Complete by: As directed    Call MD for:  temperature >100.4   Complete by: As directed    Diet - low sodium heart healthy   Complete by: As directed    Discharge instructions   Complete by: As directed    You were seen for and occlusion in the distal left popliteal artery.  This was treated by vascular surgery with Kathya Wilz mechanical thrombectomy.  You've been started on eliquis and plavix for this.  We suspect the clot was related to your atrial fibrillation.    You also were found to have had Erasmo Vertz stroke.  This was likely related to the atrial fibrillation as well.  We're treating you with eliquis.  Because you don't have insurance, you'll need to pursue assistance for the cost of the eliquis.  This will need to be arranged by your outpatient providers.  We'll continue the eliquis for now as we discharge you to SNF.  When you leave the SNF, you maybe able to get Jihad Brownlow 30 day free card for eliquis.  While you're using that, you should follow up with your outpatient providers to help you get assistance for the cost of eliquis outpatient.  If for some reason this doesn't work out, you should be switched to Dayrin Stallone more affordable blood thinner by your PCP.  IT IS EXTREMELY IMPORTANT TO FOLLOW THIS UP WITH YOUR PCP SO Dorian Duval PLAN CAN BE MADE FOR YOUR BLOOD THINNER WHEN YOU LEAVE THE SKILLED  FACILITY.  Follow up with neurology outpatient for further stroke care.  You'll need to follow up with vascular.  Follow vascular recommendations in the future regarding the duration you'll need to take the plavix.  We'll send you home with antibiotics because you had pneumonia on your CT scan.  You'll need Deneice Wack follow up CT scan as an outpatient to ensure this resolves.  We started you on lasix.  You'll need to follow up repeat labs within 1 week with your PCP to follow up your kidney function and electrolytes.  We restarted your seizure medicine here.  You should follow with your outpatient provider whether this is needed long term.  Return for new, recurrent, or worsening symptoms.  Please ask your PCP to request records from this hospitalization so they know what was done and what the next steps will be.   Increase activity slowly   Complete by: As directed    No wound care   Complete by:  As directed       Allergies as of 06/05/2022   No Known Allergies      Medication List     STOP taking these medications    esomeprazole 40 MG capsule Commonly known as: [place] these medications    albuterol 108 (90 Base) MCG/ACT inhaler Commonly known as: VENTOLIN HFA INHALE 2 PUFFS BY MOUTH EVERY 6 HOURS AS NEEDED FOR WHEEZING OR SHORTNESS OF BREATH Strength 108 (90 Base) MCG/ACT   amoxicillin 500 MG capsule Commonly known as: AMOXIL Take 2 capsules (1,000 mg total) by mouth every 8 (eight) hours for 5 days.   apixaban 5 MG Tabs tablet Commonly known as: ELIQUIS Take 1 tablet (5 mg total) by mouth 2 (two) times daily.   atorvastatin 80 MG tablet Commonly known as: LIPITOR Take 1 tablet by mouth once daily   azithromycin 500 MG tablet Commonly known as: ZITHROMAX Take 1 tablet (500 mg total) by mouth daily for 3 days.   blood glucose meter kit and supplies Dispense based on patient and insurance preference. Use up to four times daily as directed. (FOR ICD-10  E10.9, E11.9).   clopidogrel 75 MG tablet Commonly known as: PLAVIX Take 1 tablet (75 mg total) by mouth daily. Start taking on: June 06, 2022   clotrimazole-betamethasone cream Commonly known as: LOTRISONE APPLY  CREAM TOPICALLY TO AFFECTED AREA TWICE DAILY What changed: See the new instructions.   diltiazem 240 MG 24 hr capsule Commonly known as: CARDIZEM CD Take 1 capsule (240 mg total) by mouth daily.   furosemide 20 MG tablet Commonly known as: LASIX Take 1 tablet (20 mg total) by mouth daily. Follow volume status, repeat labs Start taking on: June 06, 2022   ipratropium-albuterol 0.5-2.5 (3) MG/3ML Soln Commonly known as: DUONEB Take 3 mLs by nebulization in the morning and at bedtime.   levETIRAcetam 750 MG tablet Commonly known as: KEPPRA Take 750 mg by mouth in the morning and at bedtime.   levothyroxine 150 MCG tablet Commonly known as: Synthroid Take 1 tablet (150 mcg total) by mouth daily.   metoprolol tartrate 100 MG tablet Commonly known as: LOPRESSOR Take 1 tablet by mouth twice daily   nystatin 100000 UNIT/ML suspension Commonly known as: MYCOSTATIN Take 5 mLs (500,000 Units total) by mouth 4 (four) times daily. Swish and swallow   pantoprazole 40 MG tablet Commonly known as: PROTONIX Take 1 tablet (40 mg total) by mouth daily at 12 noon.   Vitamin D (Ergocalciferol) 1.25 MG (50000 UNIT) Caps capsule Commonly known as: DRISDOL Take 1 capsule by mouth once Zoeie Ritter week What changed: when to take this   Voltaren 1 % Gel Generic drug: diclofenac Sodium Apply 4 g topically in the morning, at noon, and at bedtime.       No Known Allergies  Follow-up Information     [facility] Follow up in 1 month(s).   Why: The office will call the patient with an appointment Contact information: [address] [place] [phone] [facility]. Schedule an appointment as  soon as possible for Averie Meiner visit in 1 month(s).   Specialty: Neurology Why: stroke clinic Contact information: [place] [phone]        Megan Balloon, FNP Follow up.   Specialty: Family Medicine Contact information: [name] [address] [phone]  [facility] Follow up.   Specialty: Electrophysiology Why: call for follow up Contact information: [address] [id number] [place] [phone]                 The results of significant diagnostics from this hospitalization (including imaging, microbiology, ancillary and laboratory) are listed below for reference.    Significant Diagnostic Studies: CT CHEST WO CONTRAST  Result Date: 06/04/2022 CLINICAL DATA:  Abnormal CT, follow-up. EXAM: CT CHEST WITHOUT CONTRAST TECHNIQUE: Multidetector CT imaging of the chest was performed following the standard protocol without IV contrast. RADIATION DOSE REDUCTION: This exam was performed according to the departmental dose-optimization program which includes automated exposure control, adjustment of the mA and/or kV according to patient size and/or use of iterative reconstruction technique. COMPARISON:  CT angiogram head and neck 06/04/2022. CT angiogram chest 06/12/2021. FINDINGS: Cardiovascular: Left-sided pacemaker is present. Heart is mildly enlarged. There is no pericardial effusion. Aorta is normal in size. There are atherosclerotic calcifications of the aorta. Mediastinum/Nodes: No enlarged mediastinal or axillary lymph nodes. Thyroid gland, trachea, and esophagus demonstrate no significant findings. Lungs/Pleura: There is Marisel Tostenson small layering right pleural effusion. There is Hira Trent new focal rounded airspace opacity which is slightly masslike in the right upper lobe peripherally measuring 2.1 x 3.0 x 3.7 cm. There is surrounding  ground-glass opacity. Minimal atelectatic changes are seen in both lung bases. There is no evidence for pneumothorax. Upper Abdomen: Gallstones are present. Musculoskeletal: The bones are osteopenic. There is mild compression deformity of T3 which is new from prior. IMPRESSION: 1. New focal rounded airspace opacity in the right upper lobe with surrounding ground-glass opacity. Findings are favored as infectious/inflammatory, but neoplasm can not be excluded. Recommend short-term follow-up CT to confirm resolution. 2. Small layering right pleural effusion. 3. Mild cardiomegaly. 4. Cholelithiasis. 5. New mild compression deformity of T3, age indeterminate. Aortic Atherosclerosis (ICD10-I70.0). Electronically Signed   By: Ronney Asters M.D.   On: 06/04/2022 21:50   DG CHEST PORT 1 VIEW  Result Date: 06/04/2022 CLINICAL DATA:  Lung nodule.  Pneumonia EXAM: PORTABLE CHEST 1 VIEW COMPARISON:  X-ray 11/12/2021 and older FINDINGS: Left chest pacemaker. Overlapping cardiac leads. Borderline cardiopericardial silhouette. Underinflation. No pneumothorax, effusion or edema. There is focal opacity along the inferior right upper lobe with the adjacent thickening of the minor fissure. Acute infiltrate is possible such as pneumonia. Recommend follow-up to confirm clearance. Calcifications along the tracheobronchial tree. IMPRESSION: Right upper lobe consolidative opacity. Possible pneumonia. Recommend follow-up to confirm clearance. Pacemaker Electronically Signed   By: Jill Side M.D.   On: 06/04/2022 12:42   CT ANGIO HEAD NECK W WO CM  Result Date: 06/04/2022 CLINICAL DATA:  77 year old female with Jayvon Mounger history of subdural hematoma, but on head CT 2 days ago suspicion of right MCA territory cytotoxic edema, recent infarction. Subsequent encounter. EXAM: CT ANGIOGRAPHY HEAD AND NECK TECHNIQUE: Multidetector CT imaging of the head and neck was performed using the standard protocol during bolus administration of intravenous  contrast. Multiplanar CT image reconstructions and MIPs were obtained to evaluate the vascular anatomy. Carotid stenosis measurements (when applicable) are obtained utilizing NASCET criteria, using the distal internal carotid diameter as the denominator. RADIATION DOSE REDUCTION: This exam was performed according to the departmental dose-optimization program which includes automated exposure control, adjustment of the mA and/or kV according to patient size and/or use of iterative reconstruction technique. CONTRAST:  76m OMNIPAQUE IOHEXOL 350 MG/ML SOLN COMPARISON:  Head  CT 06/02/2022 and earlier. Chest CTA 06/12/2021. FINDINGS: CT HEAD Brain: More discrete appearing cytotoxic edema now in the posterior right insula and tracking along the lateral perirolandic cortex (series 6, image 17). Evidence of right basal ganglia involvement also, although possibly sparing the right caudate head. No hemorrhagic transformation. No significant intracranial mass effect. Background confluent and widespread bilateral white matter hypodensity. Additionally, linear roughly 2 cm hypodense area in the left superior cerebellum also seems increased from the recent comparison suggesting subacute ischemia there also. Elsewhere gray-white differentiation appears stable. No ventriculomegaly. Calvarium and skull base: No acute osseous abnormality identified. Paranasal sinuses: Visualized paranasal sinuses and mastoids are clear. Orbits: No acute orbit or scalp soft tissue finding. CTA NECK Skeleton: Carious and absent maxillary dentition. Osteopenia. Cervical spine degeneration. No acute osseous abnormality identified. Upper chest: Abnormal confluent airspace opacity throughout the posterior and lateral right upper lobe on series 17, image 518. This is new since the chest CTA last year and may be pneumonia, but lung mass would be difficult to exclude. Visible superior mediastinal lymph nodes appear to be stable. Other neck: Left chest trans  venous pacemaker type device partially visible. Retropharyngeal course of the carotids but no acute neck soft tissue finding identified. Aortic arch: 4 vessel arch configuration. Left vertebral artery arises directly from the arch. Calcified aortic atherosclerosis. Right carotid system: Mildly tortuous brachiocephalic artery and proximal right CCA with no significant plaque or stenosis. Partially retropharyngeal course of the right carotid and right carotid bifurcation with mild calcified plaque but no significant stenosis to the skull base. However, beaded appearance of the cervical right ICA at the C1 level series 15, image 25. Left carotid system: Normal left CCA origin. Tortuous proximal left CCA. Partially retropharyngeal course. Minimal calcified plaque at the left carotid bifurcation. No stenosis or significant vessel irregularity. Vertebral arteries: Atherosclerosis and tortuosity with Riaan Toledo kinked appearance of the proximal right subclavian artery resulting in 50% stenosis series 12, image 166. But normal right vertebral artery origin (same image). Right vertebral artery is mildly tortuous in the neck but patent to the skull base with no significant plaque or stenosis. Slightly dominant left vertebral artery arises directly from the arch. Tortuous left V1 segment. Late entry into the cervical transverse foramen. No significant plaque or stenosis to the skull base. CTA HEAD Posterior circulation: Dominant left vertebral artery is patent to the basilar without stenosis. Normal left PICA origin. Right vertebral V4 segment is diminutive and atherosclerotic beyond the normal right PICA origin. Up to moderate right V4 segment stenosis but patent right vertebrobasilar junction. Patent basilar artery without stenosis. Patent SCA and right PCA origins. Fetal type left PCA origin. Right posterior communicating artery diminutive or absent. Bilateral PCA branches are within normal limits. Anterior circulation: Both ICA  siphons are patent. Left siphon appears dominant owing to the ACA anatomy. Left siphon supraclinoid calcified plaque with mild to moderate associated stenosis. Normal left posterior communicating artery. Patent left ICA terminus, MCA and ACA origins. Contralateral right ICA siphon with similar mild to moderate supraclinoid calcified plaque and stenosis. Patent right carotid terminus, MCA and ACA origins. Dominant left and diminutive right ACA A1 segments. Normal anterior communicating artery. Bilateral ACA branches are within normal limits. Left MCA origin is mildly irregular and stenotic series 13, image 21, but the left M1 remains patent with no significant stenosis. Patent left MCA trifurcation. Left MCA branches are within normal limits. Right MCA M1 segment is patent without stenosis. Right MCA bifurcation is patent without stenosis.  No right M2 branch occlusion identified. And no definite M3 branch occlusion but there is Amauri Medellin paucity of posterior MCA territory branches on series 16, image 50. Venous sinuses: Early contrast timing, grossly patent. Anatomic variants: Dominant left vertebral artery arises directly from the aortic arch. Fetal type left PCA origin. Dominant left and diminutive right ACA A1 segments. Review of the MIP images confirms the above findings IMPRESSION: 1. Evolving Right MCA territory infarct (up to moderate size, predominantly posterior division). But also evidence of recent small Left Cerebellum SCA territory infarcts also. No hemorrhagic transformation or significant mass effect. 2. CTA negative for large vessel occlusion, but positive for absent distal right MCA branches in the posterior division. Also mild to moderate bilateral Supraclinoid ICA plaque, non dominant distal vertebral artery and stenosis. Also evidence of cervical Right ICA  Fibromuscular Dysplasia (FMD). 3. Positive also for partially visible Right upper lobe consolidation. Favor pneumonia, but lung mass difficult to  exclude. 4.  Aortic Atherosclerosis (ICD10-I70.0). Study discussed by telephone with Dr. Rosalin Hawking on 06/04/2022 at 11:09. He advises the patient has atrial fibrillation and has not been anticoagulated due to prior subdural hematoma. Electronically Signed   By: Genevie Ann M.D.   On: 06/04/2022 11:13   VAS US CAROTID  Result Date: 06/04/2022 Carotid Arterial Duplex Study Patient Name:  JAMAAL ABELN  Date of Exam:   06/03/2022 Medical Rec #: [id number]         Accession #:    [id number] Date of Birth: November 20, 1945         Patient Gender: F Patient Age:   51 years Exam Location:  [name] [place] Procedure:      VAS US CAROTID Referring Phys: Serjio Deupree POWELL JR --------------------------------------------------------------------------------  Indications:  CVA. Risk Factors: Hypertension, hyperlipidemia, past history of smoking. Limitations   Today's exam was limited due to the body habitus of the patient               and the patient's respiratory variation. Performing Technologist: Darlin Coco RDMS, RVT  Examination Guidelines: Jaimes Eckert complete evaluation includes B-mode imaging, spectral Doppler, color Doppler, and power Doppler as needed of all accessible portions of each vessel. Bilateral testing is considered an integral part of Stephens Shreve complete examination. Limited examinations for reoccurring indications may be performed as noted.  Right Carotid Findings: +----------+--------+--------+--------+------------------+--------+           PSV cm/sEDV cm/sStenosisPlaque DescriptionComments +----------+--------+--------+--------+------------------+--------+ CCA Prox  44      6                                          +----------+--------+--------+--------+------------------+--------+ CCA Distal36      6                                          +----------+--------+--------+--------+------------------+--------+ ICA Prox  35      7       1-39%   heterogenous                +----------+--------+--------+--------+------------------+--------+ ICA Distal65      14                                         +----------+--------+--------+--------+------------------+--------+  ECA       46                                                 +----------+--------+--------+--------+------------------+--------+ +----------+--------+-------+----------------+-------------------+           PSV cm/sEDV cmsDescribe        Arm Pressure (mmHG) +----------+--------+-------+----------------+-------------------+ Subclavian120            Multiphasic, WNL                    +----------+--------+-------+----------------+-------------------+ +---------+--------+--+--------+-+---------+ VertebralPSV cm/s23EDV cm/s4Antegrade +---------+--------+--+--------+-+---------+  Left Carotid Findings: +----------+--------+--------+--------+------------------+--------+           PSV cm/sEDV cm/sStenosisPlaque DescriptionComments +----------+--------+--------+--------+------------------+--------+ CCA Prox  57      8                                          +----------+--------+--------+--------+------------------+--------+ CCA Distal58      12                                         +----------+--------+--------+--------+------------------+--------+ ICA Prox  52      13      1-39%   heterogenous               +----------+--------+--------+--------+------------------+--------+ ICA Distal82      19                                         +----------+--------+--------+--------+------------------+--------+ ECA       62      9                                          +----------+--------+--------+--------+------------------+--------+ +----------+--------+--------+----------------+-------------------+           PSV cm/sEDV cm/sDescribe        Arm Pressure (mmHG) +----------+--------+--------+----------------+-------------------+ [id number]              Multiphasic, WNL                    +----------+--------+--------+----------------+-------------------+ +---------+--------+--+--------+-+---------+ VertebralPSV cm/s30EDV cm/s5Antegrade +---------+--------+--+--------+-+---------+   Summary: Right Carotid: Velocities in the right ICA are consistent with Amylah Will 1-39% stenosis. Left Carotid: Velocities in the left ICA are consistent with Perrion Diesel 1-39% stenosis. Vertebrals:  Bilateral vertebral arteries demonstrate antegrade flow. Subclavians: Normal flow hemodynamics were seen in bilateral subclavian              arteries. *See table(s) above for measurements and observations.  Electronically signed by Antony Contras MD on 06/04/2022 at 8:31:10 AM.    Final    ECHOCARDIOGRAM COMPLETE  Result Date: 06/03/2022    ECHOCARDIOGRAM REPORT   Patient Name:   JOHARA PERCHES Date of Exam: 06/03/2022 Medical Rec #:  [id number]        Height:       60.0 in Accession #:    [id number]       Weight:       219.4 lb Date  of Birth:  02/07/1946        BSA:          1.941 m Patient Age:    58 years         BP:           117/46 mmHg Patient Gender: F                HR:           64 bpm. Exam Location:  Inpatient Procedure: 2D Echo, Cardiac Doppler, Color Doppler and Intracardiac            Opacification Agent Indications:    stroke  History:        Patient has no prior history of Echocardiogram examinations.                 CHF, Pacemaker, Arrythmias:Atrial Fibrillation; Risk                 Factors:Hypertension and Dyslipidemia.  Sonographer:    Phineas Douglas Referring Phys: [id number] Jocsan Mcginley CALDWELL POWELL JR IMPRESSIONS  1. Left ventricular ejection fraction, by estimation, is 60 to 65%. The left ventricle has normal function. The left ventricle has no regional wall motion abnormalities. Left ventricular diastolic function could not be evaluated.  2. Right ventricular systolic function is normal. The right ventricular size is normal. There is mildly elevated pulmonary artery systolic pressure.  The estimated right ventricular systolic pressure is [id number] mmHg.  3. Left atrial size was mild to moderately dilated.  4. Right atrial size was mildly dilated.  5. The mitral valve is normal in structure. Trivial mitral valve regurgitation. No evidence of mitral stenosis.  6. The aortic valve is tricuspid. There is mild calcification of the aortic valve. Aortic valve regurgitation is not visualized. Aortic valve sclerosis/calcification is present, without any evidence of aortic stenosis.  7. The inferior vena cava is normal in size with greater than 50% respiratory variability, suggesting right atrial pressure of 3 mmHg. FINDINGS  Left Ventricle: Left ventricular ejection fraction, by estimation, is 60 to 65%. The left ventricle has normal function. The left ventricle has no regional wall motion abnormalities. The left ventricular internal cavity size was normal in size. There is  no left ventricular hypertrophy. Left ventricular diastolic function could not be evaluated due to atrial fibrillation. Left ventricular diastolic function could not be evaluated. Right Ventricle: The right ventricular size is normal. No increase in right ventricular wall thickness. Right ventricular systolic function is normal. There is mildly elevated pulmonary artery systolic pressure. The tricuspid regurgitant velocity is 2.95  m/s, and with an assumed right atrial pressure of 3 mmHg, the estimated right ventricular systolic pressure is [id number] mmHg. Left Atrium: Left atrial size was mild to moderately dilated. Right Atrium: Right atrial size was mildly dilated. Pericardium: There is no evidence of pericardial effusion. Mitral Valve: The mitral valve is normal in structure. Mild to moderate mitral annular calcification. Trivial mitral valve regurgitation. No evidence of mitral valve stenosis. Tricuspid Valve: The tricuspid valve is normal in structure. Tricuspid valve regurgitation is mild. Aortic Valve: The aortic valve is tricuspid.  There is mild calcification of the aortic valve. Aortic valve regurgitation is not visualized. Aortic valve sclerosis/calcification is present, without any evidence of aortic stenosis. Pulmonic Valve: The pulmonic valve was normal in structure. Pulmonic valve regurgitation is not visualized. Aorta: The aortic root and ascending aorta are structurally normal, with no evidence of dilitation. Venous: The inferior vena cava is normal  in size with greater than 50% respiratory variability, suggesting right atrial pressure of 3 mmHg. IAS/Shunts: No atrial level shunt detected by color flow Doppler. Additional Comments: Mylie Mccurley device lead is visualized in the right ventricle.  LEFT VENTRICLE PLAX 2D LVIDd:         3.60 cm   Diastology LVIDs:         2.20 cm   LV e' medial:    5.44 cm/s LV PW:         1.00 cm   LV E/e' medial:  20.4 LV IVS:        1.00 cm   LV e' lateral:   6.09 cm/s LVOT diam:     1.90 cm   LV E/e' lateral: 18.2 LV SV:         41 LV SV Index:   21 LVOT Area:     2.84 cm  RIGHT VENTRICLE            IVC RV Basal diam:  4.20 cm    IVC diam: 1.90 cm RV S prime:     8.38 cm/s TAPSE (M-mode): 1.3 cm LEFT ATRIUM             Index        RIGHT ATRIUM           Index LA diam:        4.00 cm 2.06 cm/m   RA Area:     18.60 cm LA Vol (A2C):   61.3 ml 31.58 ml/m  RA Volume:   45.00 ml  23.18 ml/m LA Vol (A4C):   80.9 ml 41.67 ml/m LA Biplane Vol: 73.6 ml 37.91 ml/m  AORTIC VALVE LVOT Vmax:   63.40 cm/s LVOT Vmean:  42.500 cm/s LVOT VTI:    0.143 m  AORTA Ao Root diam: 2.70 cm Ao Asc diam:  2.70 cm MITRAL VALVE                TRICUSPID VALVE MV Area (PHT): 3.21 cm     TR Peak grad:   34.8 mmHg MV Decel Time: 236 msec     TR Vmax:        295.00 cm/s MV E velocity: 111.00 cm/s                             SHUNTS                             Systemic VTI:  0.14 m                             Systemic Diam: 1.90 cm Mihai Croitoru MD Electronically signed by Sanda Klein MD Signature Date/Time: 06/03/2022/2:30:49 PM    Final     CT HEAD WO CONTRAST (5MM)  Result Date: 06/03/2022 CLINICAL DATA:  Remote subdural hematoma, need to start anticoagulation EXAM: CT HEAD WITHOUT CONTRAST TECHNIQUE: Contiguous axial images were obtained from the base of the skull through the vertex without intravenous contrast. RADIATION DOSE REDUCTION: This exam was performed according to the departmental dose-optimization program which includes automated exposure control, adjustment of the mA and/or kV according to patient size and/or use of iterative reconstruction technique. COMPARISON:  11/21/2021 FINDINGS: Brain: Ill-defined hypodensity in the right frontotemporal region appears new from the prior exam (series 3, image 15), with some loss of gray-white  differentiation in this area, and possible hypodensity in the right basal ganglia. No evidence of acute hemorrhage, mass, mass effect, or midline shift. No hydrocephalus or extra-axial fluid collection. Periventricular white matter changes, likely the sequela of chronic small vessel ischemic disease. Vascular: No hyperdense vessel. Skull: Negative for fracture or focal lesion. Sinuses/Orbits: No acute finding. Other: The mastoid air cells are well aerated. IMPRESSION: Ill-defined hypodensity in the right frontotemporal region, with some loss of gray-white differentiation in this area, and possible hypodensity in the right basal ganglia, concerning for acute infarct. MRI without contrast is recommended for further evaluation. These results were called by telephone at the time of interpretation on 06/03/2022 at 3:23 am to provider OPYD , who verbally acknowledged these results. Electronically Signed   By: Merilyn Baba M.D.   On: 06/03/2022 03:24   PERIPHERAL VASCULAR CATHETERIZATION  Result Date: 06/02/2022 Images from the original result were not included. Patient name: TENIAH AGNER MRN: [id number] DOB: 10/28/1945 Sex: female 06/02/2022 Pre-operative Diagnosis: Left lower extremity acute limb ischemia  [place] 1 Post-operative diagnosis:  Same Surgeon:  Broadus John, MD Procedure Performed: 1.  Ultrasound-guided micropuncture access of the right common femoral artery 2.  Aortogram 3.  Secondary cannulation, left lower extremity angiogram 4.  Mechanical thrombectomy using 7 French penumbra lightening system of the popliteal artery, anterior tibial artery, tibioperoneal trunk, peroneal artery. 5.  Moderate sedation 82 minutes, contrast volume 130 mL Indications: Patient is Aliene Tamura 77 year old female who presented with new onset left lower extremity pain.  CT scan demonstrated embolus in the left popliteal artery extending into the tibioperoneal trunk.  No flow distally.  Kimia had rest pain, no sensory or motor deficits.  She was Angelic Schnelle poor surgical candidate.  After discussing the risk and benefits of left lower extremity angiogram with percutaneous mechanical thrombectomy, Keyry elected to proceed. Etiology believed to be Eldrick Penick-fib embolus after discontinuing anticoagulation due to fall with head bleed last year. Findings: No flow-limiting stenosis appreciated in the aortoiliac segments bilaterally On the left, widely patent common femoral artery, SFA, profunda.  The P2 segment of the popliteal artery demonstrates Errin Whitelaw flush occlusion with no named outflow vessels.  The anterior tibial artery reconstituted at the mid tibia and the posterior tibial artery appears to reconstitute via Ashiah Karpowicz series of collaterals at the distal tibia.  Procedure:  The patient was identified in the holding area and taken to room 8.  The patient was then placed supine on the table and prepped and draped in the usual sterile fashion.  Kristi Norment time out was called.  Ultrasound was used to evaluate the right common femoral artery.  It was patent .  Milton Streicher digital ultrasound image was acquired.  Tajay Muzzy micropuncture needle was used to access the right common femoral artery under ultrasound guidance.  An 018 wire was advanced without resistance and Mava Suares micropuncture  sheath was placed.  The 018 wire was removed and Jon Kasparek benson wire was placed.  The micropuncture sheath was exchanged for Beverly Ferner 5 french sheath.  An omniflush catheter was advanced over the wire to the level of L-1.  An abdominal angiogram was obtained.  Next, using the omniflush catheter and Arlester Keehan benson wire, the aortic bifurcation was crossed and the catheter was placed into theleft external iliac artery and left runoff was obtained. I elected to intervene on the left popliteal occlusion.  The patient was heparinized with 8000 units IV heparin and Aaliayah Miao 7 [place] by 60 cm sheath was brought onto the field.  There  was some difficulty bringing the sheath up and over the aortic bifurcation, however was able to do so with the use of an Amplatz wire.  This wire was then switched out and Wen Munford series of wires and catheters were used to traverse the popliteal artery lesion.  The wire preferentially chose the anterior tibial artery, therefore I elected to begin percutaneous mechanical thrombectomy from the P2 segment of the popliteal artery into the anterior tibial artery using the 7 French penumbra lightening system.  This took Olivene Cookston considerable mount time, however I was able to retrieve what appeared to be Cisco Kindt-fib embolus which had an acute component on the more proximal portion of the embolus. Once the anterior tibial artery was recanalized, I moved to the tibioperoneal trunk.  I was able to pass wire into the peroneal artery, and suction thrombectomy of the tibioperoneal trunk followed into the peroneal artery.  Follow-up angiography demonstrated excellent result with reconstitution of the TPT trunk and peroneal artery.  I could not find the ostia of the posterior tibial artery, and therefore elected to terminate the case. The right-sided access site was closed with the use of Elba Schaber ProGlide device without issue.  I was aware that another device would result in failure, especially due to the patient's morbid obesity. Being that this is an Kamryn Messineo-fib  embolus, will need to discuss therapeutic anticoagulation.  I realize this was discontinued due to Mehak Roskelley fall, however risk and benefits discussion needs to occur. Will heparinize at the 8-hour mark, and keep bedrest. Impression: Successful mechanical thrombectomy of the popliteal artery, anterior tibial artery, tibioperoneal trunk, peroneal artery. [name], MD [facility] Office: [phone]   VAS [place] ABI WITH/WO TBI  Result Date: 06/01/2022  LOWER EXTREMITY DOPPLER STUDY Patient Name:  KAYDON KALBFLEISCH  Date of Exam:   06/01/2022 Medical Rec #: [id number]         Accession #:    [id number] Date of Birth: September 23, 1945         Patient Gender: F Patient Age:   54 years Exam Location:  [facility] Procedure:      VAS [place] ABI WITH/WO TBI Referring Phys: Servando Snare --------------------------------------------------------------------------------  Indications: Peripheral artery disease. High Risk Factors: Hypertension, hyperlipidemia, past history of smoking.  Limitations: Today's examination was extremely limited secondary to patient pain              with cuff inflation. Comparison Study: 05-31-2022 CTA abdominal aorta w/ lower extremity at outside                   facility. acute appearing occlusion of the distal left                   popliteal artery beginning at the P3 segment and extending                   into the trifurcation vessels. The anterior tibial and                   peroneal arteries remain patent but the posterior tibial                   artery appears occluded proximally. The PTA does reconstitute                   via collateral flow. Performing Technologist: Darlin Coco RDMS RVT  Examination Guidelines: Ladon Heney complete evaluation includes at minimum, Doppler waveform signals and  systolic blood pressure reading at the level of bilateral brachial, anterior tibial, and posterior tibial arteries, when vessel segments are accessible. Bilateral testing is considered  an integral part of Antoine Vandermeulen complete examination. Photoelectric Plethysmograph (PPG) waveforms and toe systolic pressure readings are included as required and additional duplex testing as needed. Limited examinations for reoccurring indications may be performed as noted.  ABI Findings: +---------+------------------+-----+-----------+-------------------------------+ Right    Rt Pressure (mmHg)IndexWaveform   Comment                         +---------+------------------+-----+-----------+-------------------------------+ Brachial 148                    triphasic                                  +---------+------------------+-----+-----------+-------------------------------+ PTA                             multiphasicUnable to obtain pressure                                                  secondary to patient pain       +---------+------------------+-----+-----------+-------------------------------+ DP       255               1.70 multiphasicSuboptimal secondary to patient                                            pain                            +---------+------------------+-----+-----------+-------------------------------+ Great Toe34                0.23 Abnormal                                   +---------+------------------+-----+-----------+-------------------------------+ +---------+------------------+-----+----------+--------------------------------+ Left     Lt Pressure (mmHg)IndexWaveform  Comment                          +---------+------------------+-----+----------+--------------------------------+ Brachial 150                                                               +---------+------------------+-----+----------+--------------------------------+ PTA                             monophasicUnable to obtain pressure                                                  secondary to patient pain         +---------+------------------+-----+----------+--------------------------------+  DP       92                0.61 monophasicSuboptimal secondary to patient                                            pain                             +---------+------------------+-----+----------+--------------------------------+ Great Toe0                 0.00 Absent                                     +---------+------------------+-----+----------+--------------------------------+ Arterial wall calcification precludes accurate ankle pressures and ABIs.  Summary: Right: Resting right ankle-brachial index indicates noncompressible right lower extremity arteries. The right toe-brachial index is abnormal. Left: Resting left ankle-brachial index indicates moderate left lower extremity arterial disease; however, arterial calcification may preclude accurate pressures. The left toe-brachial index is absent. *See table(s) above for measurements and observations.  Electronically signed by Harold Barban MD on 06/01/2022 at 10:12:47 PM.    Final    DG Knee Complete 4 Views Left  Result Date: 06/01/2022 CLINICAL DATA:  Chronic left knee pain EXAM: LEFT KNEE - COMPLETE 4+ VIEW COMPARISON:  Left tibia done on 03/11/2017 FINDINGS: No recent fracture or dislocation is seen. There is no significant effusion in suprapatellar bursa. Osteopenia is seen in bony structures. Degenerative changes are noted with bony spurs in medial, lateral and patellofemoral compartments, more prominent in the medial compartment. There is narrowing of joint space in medial compartment. IMPRESSION: No recent fracture or dislocation is seen in left knee. Osteopenia. Degenerative changes are noted, more severe in the medial compartment. Electronically Signed   By: Elmer Picker M.D.   On: 06/01/2022 10:02   CUP PACEART REMOTE DEVICE CHECK  Result Date: 05/27/2022 Scheduled remote reviewed. Normal device function.  Device has reached ERI - route to  triage Next remote to be determined [place]   Microbiology: No results found for this or any previous visit (from the past 240 hour(s)).   Labs: Basic Metabolic Panel: Recent Labs  Lab 06/01/22 0043 06/02/22 0836 06/02/22 1607 06/03/22 0703 06/04/22 0059 06/05/22 0059  NA 131* 134* 123* 134* 132* 132*  K 4.4 4.5 3.8 4.6 4.3 3.8  CL 100 102 90* 102 102 100  CO2 20* 18*  --  19* 19* 20*  GLUCOSE 96 91 98 105* 92 88  BUN 7* '9 9 11 9 '$ 7*  CREATININE 0.83 0.94 0.50 0.80 1.02* 0.99  CALCIUM 8.4* 8.7*  --  8.3* 8.2* 8.4*  MG  --   --   --   --   --  1.8  PHOS  --   --   --   --   --  3.3   Liver Function Tests: Recent Labs  Lab 06/04/22 0059  AST 34  ALT 21  ALKPHOS 126  BILITOT 1.3*  PROT 5.2*  ALBUMIN 2.3*   No results for input(s): "LIPASE", "AMYLASE" in the last 168 hours. No results for input(s): "AMMONIA" in the last 168 hours. CBC: Recent Labs  Lab 06/01/22 0043 06/02/22 0803 06/02/22 1607 06/03/22 [id number] 06/04/22 0059 06/05/22 0059  WBC 15.3* 11.6*  --  13.1* 11.8* 10.6*  HGB 13.7 13.1 12.9 12.0 11.3* 11.3*  HCT 40.7 40.7 38.0 35.5* 32.9* 34.9*  MCV 91.9 94.4  --  92.2 91.4 93.1  PLT PLATELET CLUMPS NOTED ON SMEAR, UNABLE TO ESTIMATE 217  --  301 265 268   Cardiac Enzymes: No results for input(s): "CKTOTAL", "CKMB", "CKMBINDEX", "TROPONINI" in the last 168 hours. BNP: BNP (last 3 results) Recent Labs    06/12/21 0746 06/01/22 0043 06/03/22 0754  BNP 202.0* 221.3* 199.2*    ProBNP (last 3 results) No results for input(s): "PROBNP" in the last 8760 hours.  CBG: Recent Labs  Lab 06/02/22 1116  GLUCAP 112*       Signed:  Fayrene Helper MD.  [facility] 06/05/2022, 11:57 AM

## 2022-06-07 ENCOUNTER — Telehealth: Payer: Self-pay | Admitting: Physician Assistant

## 2022-06-07 DIAGNOSIS — R4189 Other symptoms and signs involving cognitive functions and awareness: Secondary | ICD-10-CM | POA: Diagnosis not present

## 2022-06-07 DIAGNOSIS — Z950 Presence of cardiac pacemaker: Secondary | ICD-10-CM | POA: Diagnosis not present

## 2022-06-07 DIAGNOSIS — I82432 Acute embolism and thrombosis of left popliteal vein: Secondary | ICD-10-CM | POA: Diagnosis not present

## 2022-06-07 DIAGNOSIS — I5032 Chronic diastolic (congestive) heart failure: Secondary | ICD-10-CM | POA: Diagnosis not present

## 2022-06-07 DIAGNOSIS — R5381 Other malaise: Secondary | ICD-10-CM | POA: Diagnosis not present

## 2022-06-07 DIAGNOSIS — G40909 Epilepsy, unspecified, not intractable, without status epilepticus: Secondary | ICD-10-CM | POA: Diagnosis not present

## 2022-06-07 DIAGNOSIS — I441 Atrioventricular block, second degree: Secondary | ICD-10-CM | POA: Diagnosis not present

## 2022-06-07 DIAGNOSIS — J449 Chronic obstructive pulmonary disease, unspecified: Secondary | ICD-10-CM | POA: Diagnosis not present

## 2022-06-07 DIAGNOSIS — E039 Hypothyroidism, unspecified: Secondary | ICD-10-CM | POA: Diagnosis not present

## 2022-06-07 MED FILL — Midazolam HCl Inj 5 MG/5ML (Base Equivalent): INTRAMUSCULAR | Qty: 1 | Status: AC

## 2022-06-07 NOTE — Telephone Encounter (Signed)
-----   Message from [place], [place] sent at 06/03/2022  7:29 AM EST ----- S/p Aortogram, arteriogram LLE with mechanical thrombectomy of left pop, AT, TPT and peroneal artery by Dr. Virl Cagey. She needs follow up in 1 month with LLE arterial duplex and ABIs. Thanks

## 2022-06-08 DIAGNOSIS — I679 Cerebrovascular disease, unspecified: Secondary | ICD-10-CM | POA: Diagnosis not present

## 2022-06-08 DIAGNOSIS — I739 Peripheral vascular disease, unspecified: Secondary | ICD-10-CM | POA: Diagnosis not present

## 2022-06-08 DIAGNOSIS — R5381 Other malaise: Secondary | ICD-10-CM | POA: Diagnosis not present

## 2022-06-09 DIAGNOSIS — I739 Peripheral vascular disease, unspecified: Secondary | ICD-10-CM | POA: Diagnosis not present

## 2022-06-09 DIAGNOSIS — L89896 Pressure-induced deep tissue damage of other site: Secondary | ICD-10-CM | POA: Diagnosis not present

## 2022-06-09 DIAGNOSIS — I509 Heart failure, unspecified: Secondary | ICD-10-CM | POA: Diagnosis not present

## 2022-06-09 NOTE — Addendum Note (Signed)
Addended by: Douglass Rivers D on: 06/09/2022 01:23 PM   Modules accepted: Level of Service

## 2022-06-09 NOTE — Progress Notes (Signed)
Remote pacemaker transmission.   

## 2022-06-10 DIAGNOSIS — I82432 Acute embolism and thrombosis of left popliteal vein: Secondary | ICD-10-CM | POA: Diagnosis not present

## 2022-06-10 DIAGNOSIS — E785 Hyperlipidemia, unspecified: Secondary | ICD-10-CM | POA: Diagnosis not present

## 2022-06-10 DIAGNOSIS — I10 Essential (primary) hypertension: Secondary | ICD-10-CM | POA: Diagnosis not present

## 2022-06-14 ENCOUNTER — Telehealth: Payer: Self-pay

## 2022-06-14 DIAGNOSIS — E119 Type 2 diabetes mellitus without complications: Secondary | ICD-10-CM | POA: Diagnosis not present

## 2022-06-14 DIAGNOSIS — E039 Hypothyroidism, unspecified: Secondary | ICD-10-CM | POA: Diagnosis not present

## 2022-06-14 DIAGNOSIS — I10 Essential (primary) hypertension: Secondary | ICD-10-CM | POA: Diagnosis not present

## 2022-06-14 NOTE — Telephone Encounter (Signed)
Instruction letter has been faxed to [facility] [phone] Attn: [place] facility will do pt's labwork and fax to our office.  Scrub will be left @ front desk and Amy will come by to pick up.   I called pt's Son and informed him of all of this information.

## 2022-06-14 NOTE — Telephone Encounter (Signed)
LM on pt's home phone and cell phone. Also called her Son and LM for him to call back. The pt is in a SNF but I can't find in her chart where or what facility she is in .   Pt needs to be scheduled for a PPM Gen Change.

## 2022-06-14 NOTE — Telephone Encounter (Signed)
Spoke to pt's Son and he stated that she is at [facility]. He gave me the number to call [phone]. I called the facility and had to leave a message with the person that schedules appt's for the pts. Her name is Amy and her ext is [phone].

## 2022-06-15 ENCOUNTER — Ambulatory Visit: Payer: Self-pay | Admitting: *Deleted

## 2022-06-16 DIAGNOSIS — L89896 Pressure-induced deep tissue damage of other site: Secondary | ICD-10-CM | POA: Diagnosis not present

## 2022-06-16 DIAGNOSIS — I509 Heart failure, unspecified: Secondary | ICD-10-CM | POA: Diagnosis not present

## 2022-06-16 DIAGNOSIS — I739 Peripheral vascular disease, unspecified: Secondary | ICD-10-CM | POA: Diagnosis not present

## 2022-06-17 ENCOUNTER — Other Ambulatory Visit: Payer: Self-pay | Admitting: *Deleted

## 2022-06-17 NOTE — Patient Outreach (Signed)
Per [facility] Mrs. Claude resides in [place]. Screening for potential [facility] care coordination services as benefit of health plan and PCP.  Collaboration with [facility] social worker. Anticipated transition plan is to return home with family. Son Chrissie Noa is primary contact.   Will plan outreach as appropriate to son to discuss [facility] care coordination services.   Marthenia Rolling, MSN, RN,BSN [facility] Coordinator [phone] (Direct dial)

## 2022-06-23 DIAGNOSIS — I509 Heart failure, unspecified: Secondary | ICD-10-CM | POA: Diagnosis not present

## 2022-06-23 DIAGNOSIS — L89896 Pressure-induced deep tissue damage of other site: Secondary | ICD-10-CM | POA: Diagnosis not present

## 2022-06-23 DIAGNOSIS — I739 Peripheral vascular disease, unspecified: Secondary | ICD-10-CM | POA: Diagnosis not present

## 2022-06-25 LAB — COMPREHENSIVE METABOLIC PANEL: EGFR: 60

## 2022-06-28 ENCOUNTER — Ambulatory Visit: Payer: Medicare Other

## 2022-06-28 NOTE — Progress Notes (Signed)
Remote pacemaker transmission.   

## 2022-06-30 DIAGNOSIS — L89892 Pressure ulcer of other site, stage 2: Secondary | ICD-10-CM | POA: Diagnosis not present

## 2022-06-30 DIAGNOSIS — R112 Nausea with vomiting, unspecified: Secondary | ICD-10-CM | POA: Diagnosis not present

## 2022-06-30 DIAGNOSIS — I739 Peripheral vascular disease, unspecified: Secondary | ICD-10-CM | POA: Diagnosis not present

## 2022-06-30 DIAGNOSIS — I509 Heart failure, unspecified: Secondary | ICD-10-CM | POA: Diagnosis not present

## 2022-07-02 DIAGNOSIS — R112 Nausea with vomiting, unspecified: Secondary | ICD-10-CM | POA: Diagnosis not present

## 2022-07-06 ENCOUNTER — Ambulatory Visit (INDEPENDENT_AMBULATORY_CARE_PROVIDER_SITE_OTHER): Payer: Medicare Other | Admitting: Neurology

## 2022-07-06 ENCOUNTER — Encounter: Payer: Self-pay | Admitting: Neurology

## 2022-07-06 VITALS — BP 115/61 | HR 80 | Ht 60.0 in | Wt 223.1 lb

## 2022-07-06 DIAGNOSIS — I6389 Other cerebral infarction: Secondary | ICD-10-CM

## 2022-07-06 DIAGNOSIS — I4821 Permanent atrial fibrillation: Secondary | ICD-10-CM

## 2022-07-06 DIAGNOSIS — E782 Mixed hyperlipidemia: Secondary | ICD-10-CM | POA: Diagnosis not present

## 2022-07-06 DIAGNOSIS — I639 Cerebral infarction, unspecified: Secondary | ICD-10-CM | POA: Insufficient documentation

## 2022-07-06 DIAGNOSIS — I10 Essential (primary) hypertension: Secondary | ICD-10-CM | POA: Diagnosis not present

## 2022-07-06 NOTE — Patient Instructions (Addendum)
Keep appointment with vascular next week  Keep BP < 130/90, LDL < 70, A1C < 7.0 for stroke prevention  We recommended Eliquis for stroke prevention, also on Plavix, duration to be determined by vascular  Need to follow up on right upper chest opacity, recommended repeat CT chest She has declined repeat sleep study  Follow up in 6 months with Dr. Pearlean Brownie

## 2022-07-06 NOTE — Progress Notes (Signed)
Patient: Megan Larochehyllis H Dreese Date of Birth: 13-Jun-1945  Reason for Visit: Stroke clinic follow up History from: Patient, facility aide Primary Neurologist: Pearlean BrownieSethi   ASSESSMENT AND PLAN 77 y.o. year old female   1.  Stroke, right MCA infarct likely due to A-fib not on anticoagulation 2.  PVD with occlusion of distal left popliteal artery, s/p left lower extremity angiogram with mechanical thrombectomy etiology felt to be A-fib 3.  Atrial fibrillation 4.  Seizure disorder 5.  Hypertension 6.  Hyperlipidemia  -Continue on Eliquis for secondary stroke prevention, also on Plavix, duration per vascular, keep follow-up appointment with vascular next week -Discussed the importance of management of vascular risk factors for secondary stroke prevention BP less than 130/90, LDL less than 70, A1c less than 7.0 (on Lipitor, Lasix, metoprolol) -Discussed need for repeat CT chest per hospital discharge for evaluation of right upper lobe opacity, cannot exclude neoplasm -Remains on Keppra 750 mg twice daily for reported seizure-like activity after SDH May 2023, will continue for now -Encouraged to continue work with physical therapy, try to be as active as possible -Yet to determine if she will be discharged home or remain at SNF -We will see her back in 6 months, will have her see Dr. Pearlean BrownieSethi, for 5144-month stroke follow-up  HISTORY OF PRESENT ILLNESS: Today 07/06/22 Megan [name] here for stroke clinic follow-up.  Found to have right MCA infarct.  Has pacemaker that is not MRI compatible.  Likely due to A-fib not on anticoagulation (due to history of multiple falls and subdural hematomas).  Was originally admitted 05/31/2022 for left leg pain,s/p: aortogram, arteriogram LLE with mechanical thrombectomy of popliteal, AT, TPT and peroneal.  Was on IV heparin plan to switch to Eliquis.  Found to have pneumonia, but recommend repeat CT chest.  History of seizures on Keppra 750 twice a day.  [facility]  Scheduled for pacemaker battery replacement 07/26/22.  Has vascular follow-up 07/16/2022.  She was discharged from the hospital 06/05/22 on Plavix and Eliquis.  Long-term plan per vascular.  Dr. Roda ShuttersXu had recommended DOAC. She is here today with transport from facility, prior to CVA was living at home with her son. She could stand and pivot but mostly stayed in wheelchair, could do her ADLs, washed in basin. She still has some achy pain to her left leg/knee, worse last night with rainy weather. Is doing therapy, but they come to the room, mostly stays in the bed or wheelchair. Claims on Keppra since May 2023 with SDH, for seizure type spells. Saw Dr. Vickey Hugerohmeier 2017, was told no longer sleep apnea, denies snoring, claims sleeps fine. She does report achy pain all over, has been having some spitting up. She brief periods of being tearful during visit.   CT Head- ill-defined hypodensity in the right frontotemporal region, with some loss of gray-white differentiation in this area, and possible hypodensity in the right basal ganglia, concerning for acute infarct.  CT repeat Evolving Right MCA territory infarct (up to moderate size, predominantly posterior division). But also evidence of recent small left Cerebellum SCA territory infarcts. No  hemorrhagic transformation or significant mass effect.  CTA head & neck negative for large vessel occlusion, but positive for absent distal right MCA branches in the posterior division. Also mild to moderate bilateral Supraclinoid ICA plaque, non dominant distal vertebral artery and stenosis. Also evidence of cervical Right ICA  Fibromuscular Dysplasia (FMD).  MRI not performed due to incompatible pacemaker Carotid Doppler  1-39%  bilateral ICA stenosis, normal flow dynamics in subclavians. Vertebral arteries with antegrade flow.  2D Echo EF 60-65%, LA mildly dilated  LDL 135 HgbA1c 5.5 VTE prophylaxis - heparin IV No antithrombotic prior to admission, now on heparin  IV. Recommend DOAC prior to discharge. Currently also on plavix per [facility].  HISTORY  06/03/22 Dr. Otelia Limes: [name] is a 77 y.o. female with PMH significant for atrial fibrillation (not on anticoagulation due to history of multiple falls and subdural hematomas), second-degree AV block with non-MRI compatible pacemaker, hypertension, anxiety, hypothyroidism, COPD who was admitted to the hospital for 3/4 with complaints of left leg pain. Left lower extremity angiogram performed by vascular surgery 3/6: No flow-limiting stenosis appreciated.  CT brain was obtained before placing patient on Plavix as patient has history of SDHs: image showed area of acute hypodensity in the right MCA territory.  MRI unable to be completed due to patient's noncompatible pacemaker.  Neurology consulted for further recommendations.   On exam, patient is alert and oriented, with relatively mild left sided motor deficits seen.  REVIEW OF SYSTEMS: Out of a complete 14 system review of symptoms, the patient complains only of the following symptoms, and all other reviewed systems are negative.  See HPI  ALLERGIES: No Known Allergies  HOME MEDICATIONS: Outpatient Medications Prior to Visit  Medication Sig Dispense Refill   albuterol (VENTOLIN HFA) 108 (90 Base) MCG/ACT inhaler INHALE 2 PUFFS BY MOUTH EVERY 6 HOURS AS NEEDED FOR WHEEZING OR SHORTNESS OF BREATH Strength 108 (90 Base) MCG/ACT 18 g 0   alum & mag hydroxide-simeth (MAALOX PLUS) 400-400-40 MG/5ML suspension Take 15 mLs by mouth every 6 (six) hours as needed for indigestion.     apixaban (ELIQUIS) 5 MG TABS tablet Take 1 tablet (5 mg total) by mouth 2 (two) times daily. 60 tablet 0   atorvastatin (LIPITOR) 80 MG tablet Take 1 tablet by mouth once daily 90 tablet 1   blood glucose meter kit and supplies Dispense based on patient and insurance preference. Use up to four times daily as directed. (FOR ICD-10 E10.9, E11.9). 1 each 0   clopidogrel (PLAVIX) 75 MG  tablet Take 1 tablet (75 mg total) by mouth daily.     diclofenac Sodium (VOLTAREN) 1 % GEL Apply 4 g topically in the morning, at noon, and at bedtime.     diltiazem (CARDIZEM CD) 240 MG 24 hr capsule Take 1 capsule (240 mg total) by mouth daily. 90 capsule 2   furosemide (LASIX) 20 MG tablet Take 1 tablet (20 mg total) by mouth daily. Follow volume status, repeat labs 30 tablet 0   ipratropium-albuterol (DUONEB) 0.5-2.5 (3) MG/3ML SOLN Take 3 mLs by nebulization in the morning and at bedtime. 360 mL 0   levETIRAcetam (KEPPRA) 750 MG tablet Take 750 mg by mouth in the morning and at bedtime.     levothyroxine (SYNTHROID) 150 MCG tablet Take 1 tablet (150 mcg total) by mouth daily. 30 tablet 11   metoprolol tartrate (LOPRESSOR) 100 MG tablet Take 1 tablet by mouth twice daily 60 tablet 2   Multiple Vitamin (MULTIVITAMIN WITH MINERALS) TABS tablet Take 1 tablet by mouth daily.     nystatin (MYCOSTATIN) 100000 UNIT/ML suspension Take 5 mLs (500,000 Units total) by mouth 4 (four) times daily. Swish and swallow 60 mL 0   pantoprazole (PROTONIX) 40 MG tablet Take 1 tablet (40 mg total) by mouth daily at 12 noon.     polyethylene glycol powder (GLYCOLAX/MIRALAX) 17 GM/SCOOP powder  Take 1 Container by mouth once.     Vitamin D, Ergocalciferol, (DRISDOL) 1.25 MG (50000 UNIT) CAPS capsule Take 1 capsule by mouth once a week (Patient taking differently: Take 50,000 Units by mouth every 7 (seven) days.) 12 capsule 1   clotrimazole-betamethasone (LOTRISONE) cream APPLY  CREAM TOPICALLY TO AFFECTED AREA TWICE DAILY (Patient taking differently: Apply 1 application  topically 2 (two) times daily.) 45 g 0   No facility-administered medications prior to visit.    PAST MEDICAL HISTORY: Past Medical History:  Diagnosis Date   Acute diastolic heart failure    Anxiety state, unspecified    Asthma    Atrial fibrillation    Congestive heart failure, unspecified    CVA (cerebral vascular accident)    Degenerative  joint disease    GERD (gastroesophageal reflux disease)    Hyperlipidemia    Hypertension    Lymphedema    Obesity    OSA (obstructive sleep apnea) 06/09/2015   Persistent atrial fibrillation     post-termination pauses with afib s/p PPM   Presence of permanent cardiac pacemaker    Sinoatrial node dysfunction    s/p PPM   Sleep apnea    compliant with CPAP   Unspecified hypothyroidism    Unspecified venous (peripheral) insufficiency     PAST SURGICAL HISTORY: Past Surgical History:  Procedure Laterality Date   ABDOMINAL AORTOGRAM W/LOWER EXTREMITY N/A 06/02/2022   Procedure: ABDOMINAL AORTOGRAM W/LOWER EXTREMITY;  Surgeon: Victorino Sparrow, MD;  Location: [facility] INVASIVE CV LAB;  Service: Cardiovascular;  Laterality: N/A;   CATARACT EXTRACTION W/PHACO Left 09/09/2014   Procedure: CATARACT EXTRACTION PHACO AND INTRAOCULAR LENS PLACEMENT (IOC);  Surgeon: Gemma Payor, MD;  Location: [facility] ORS;  Service: Ophthalmology;  Laterality: Left;  CDE: 6.99   CATARACT EXTRACTION W/PHACO Right 10/07/2014   Procedure: CATARACT EXTRACTION PHACO AND INTRAOCULAR LENS PLACEMENT RIGHT EYE;  Surgeon: Gemma Payor, MD;  Location: [facility]S;  Service: Ophthalmology;  Laterality: Right;  CDE:5.16   PACEMAKER INSERTION     SJM by JA   PERIPHERAL VASCULAR THROMBECTOMY  06/02/2022   Procedure: PERIPHERAL VASCULAR THROMBECTOMY;  Surgeon: Victorino Sparrow, MD;  Location: [facility] INVASIVE CV LAB;  Service: Cardiovascular;;    FAMILY HISTORY: Family History  Problem Relation Age of Onset   Parkinson's disease Other    CVA Other    Parkinson's disease Father     SOCIAL HISTORY: Social History   Socioeconomic History   Marital status: Widowed    Spouse name: Not on file   Number of children: 1   Years of education: 12   Highest education level: High school graduate  Occupational History   Occupation: RETIRED    Employer: RETIRED  Tobacco Use   Smoking status: Former    Packs/day: 0.50    Years: 10.00    Additional  pack years: 0.00    Total pack years: 5.00    Types: Cigarettes    Quit date: 03/29/1978    Years since quitting: 44.3    Passive exposure: Past   Smokeless tobacco: Never  Vaping Use   Vaping Use: Never used  Substance and Sexual Activity   Alcohol use: No    Alcohol/week: 0.0 standard drinks of alcohol   Drug use: No   Sexual activity: Not Currently    Partners: Male    Birth control/protection: None  Other Topics Concern   Not on file  Social History Narrative   Her son lives with her and helps her a lot  She can barely walk   Social Determinants of Health   Financial Resource Strain: Low Risk  (12/14/2021)   Overall Financial Resource Strain (CARDIA)    Difficulty of Paying Living Expenses: Not hard at all  Food Insecurity: No Food Insecurity (06/01/2022)   Hunger Vital Sign    Worried About Running Out of Food in the Last Year: Never true    Ran Out of Food in the Last Year: Never true  Transportation Needs: No Transportation Needs (06/01/2022)   PRAPARE - Administrator, Civil Service (Medical): No    Lack of Transportation (Non-Medical): No  Physical Activity: Inactive (12/14/2021)   Exercise Vital Sign    Days of Exercise per Week: 0 days    Minutes of Exercise per Session: 0 min  Stress: No Stress Concern Present (12/14/2021)   [employer] of Occupational Health - Occupational Stress Questionnaire    Feeling of Stress : Only a little  Social Connections: Socially Isolated (12/14/2021)   Social Connection and Isolation Panel [NHANES]    Frequency of Communication with Friends and Family: More than three times a week    Frequency of Social Gatherings with Friends and Family: More than three times a week    Attends Religious Services: Never    Database administrator or Organizations: No    Attends Banker Meetings: Never    Marital Status: Widowed  Intimate Partner Violence: Not At Risk (06/01/2022)   Humiliation, Afraid, Rape, and Kick  questionnaire    Fear of Current or Ex-Partner: No    Emotionally Abused: No    Physically Abused: No    Sexually Abused: No   PHYSICAL EXAM  Vitals:   07/06/22 0803  BP: 115/61  Pulse: 80  Weight: 223 lb 1.7 oz (101.2 kg)  Height: 5' (1.524 m)   Body mass index is 43.57 kg/m.  Generalized: No acute distress, elderly female in a wheelchair, obese Neurological examination  Mentation: Alert, oriented to time, date of birth, situation, provides her own history and does so with surprising accuracy, she does get tearful briefly several times Cranial nerve II-XII: Pupils were equal round reactive to light. Extraocular movements were full, visual field were full on confrontational test. Facial sensation and strength were normal. Head turning and shoulder shrug  were normal and symmetric.  Speech is slightly slurred may be baseline for her. Motor: Left arm drift to 50% at 10 seconds, 3/5 left arm strength, right arm 4/5 no drift. 3/5 right leg, 2/5 left leg with large extremities but symmetric warm to touch. Sensory: Sensory testing is intact to soft touch on all 4 extremities. No evidence of extinction is noted.  Coordination: Cerebellar testing reveals good finger-nose-finger bilaterally, cannot perform heel-to-shin Gait and station: Is nonambulatory, in a wheelchair Reflexes: Deep tendon reflexes are symmetric but decreased  DIAGNOSTIC DATA (LABS, IMAGING, TESTING) - I reviewed patient records, labs, notes, testing and imaging myself where available.  Lab Results  Component Value Date   WBC 10.6 (H) 06/05/2022   HGB 11.3 (L) 06/05/2022   HCT 34.9 (L) 06/05/2022   MCV 93.1 06/05/2022   PLT 268 06/05/2022      Component Value Date/Time   NA 132 (L) 06/05/2022 0059   NA 140 09/10/2021 1529   K 3.8 06/05/2022 0059   CL 100 06/05/2022 0059   CO2 20 (L) 06/05/2022 0059   GLUCOSE 88 06/05/2022 0059   BUN 7 (L) 06/05/2022 0059   BUN 8 09/10/2021  1529   CREATININE 0.99 06/05/2022  0059   CALCIUM 8.4 (L) 06/05/2022 0059   PROT 5.2 (L) 06/04/2022 0059   PROT 6.1 09/10/2021 1529   ALBUMIN 2.3 (L) 06/04/2022 0059   ALBUMIN 3.6 (L) 09/10/2021 1529   AST 34 06/04/2022 0059   ALT 21 06/04/2022 0059   ALKPHOS 126 06/04/2022 0059   BILITOT 1.3 (H) 06/04/2022 0059   BILITOT 1.6 (H) 09/10/2021 1529   GFRNONAA 58 (L) 06/05/2022 0059   GFRAA 62 02/12/2020 0933   Lab Results  Component Value Date   CHOL 209 (H) 06/04/2022   HDL 48 06/04/2022   LDLCALC 135 (H) 06/04/2022   TRIG 131 06/04/2022   CHOLHDL 4.4 06/04/2022   Lab Results  Component Value Date   HGBA1C 5.5 06/03/2022   No results found for: "VITAMINB12" Lab Results  Component Value Date   TSH 0.386 (L) 09/10/2021    Margie Ege, AGNP-C, DNP 07/06/2022, 8:56 AM [facility] [address] [place], [place] [address] [phone]

## 2022-07-06 NOTE — Progress Notes (Signed)
I agree with the above plan 

## 2022-07-07 ENCOUNTER — Other Ambulatory Visit: Payer: Self-pay

## 2022-07-07 DIAGNOSIS — I739 Peripheral vascular disease, unspecified: Secondary | ICD-10-CM

## 2022-07-07 DIAGNOSIS — I509 Heart failure, unspecified: Secondary | ICD-10-CM | POA: Diagnosis not present

## 2022-07-07 DIAGNOSIS — L89892 Pressure ulcer of other site, stage 2: Secondary | ICD-10-CM | POA: Diagnosis not present

## 2022-07-07 DIAGNOSIS — Z09 Encounter for follow-up examination after completed treatment for conditions other than malignant neoplasm: Secondary | ICD-10-CM

## 2022-07-12 DIAGNOSIS — K5900 Constipation, unspecified: Secondary | ICD-10-CM | POA: Diagnosis not present

## 2022-07-12 DIAGNOSIS — B351 Tinea unguium: Secondary | ICD-10-CM | POA: Diagnosis not present

## 2022-07-12 DIAGNOSIS — I7091 Generalized atherosclerosis: Secondary | ICD-10-CM | POA: Diagnosis not present

## 2022-07-12 DIAGNOSIS — I10 Essential (primary) hypertension: Secondary | ICD-10-CM | POA: Diagnosis not present

## 2022-07-12 DIAGNOSIS — R112 Nausea with vomiting, unspecified: Secondary | ICD-10-CM | POA: Diagnosis not present

## 2022-07-12 DIAGNOSIS — E440 Moderate protein-calorie malnutrition: Secondary | ICD-10-CM | POA: Diagnosis not present

## 2022-07-15 DIAGNOSIS — F419 Anxiety disorder, unspecified: Secondary | ICD-10-CM | POA: Diagnosis not present

## 2022-07-15 DIAGNOSIS — F4320 Adjustment disorder, unspecified: Secondary | ICD-10-CM | POA: Diagnosis not present

## 2022-07-16 ENCOUNTER — Ambulatory Visit (INDEPENDENT_AMBULATORY_CARE_PROVIDER_SITE_OTHER): Payer: Medicare Other | Admitting: Physician Assistant

## 2022-07-16 ENCOUNTER — Ambulatory Visit (INDEPENDENT_AMBULATORY_CARE_PROVIDER_SITE_OTHER)
Admission: RE | Admit: 2022-07-16 | Discharge: 2022-07-16 | Disposition: A | Discharge status: Home / Self Care | Payer: Medicare Other | Source: Ambulatory Visit | Attending: Vascular Surgery | Admitting: Vascular Surgery

## 2022-07-16 ENCOUNTER — Other Ambulatory Visit: Payer: Self-pay | Admitting: *Deleted

## 2022-07-16 ENCOUNTER — Ambulatory Visit (HOSPITAL_COMMUNITY)
Admission: RE | Admit: 2022-07-16 | Discharge: 2022-07-16 | Disposition: A | Discharge status: Home / Self Care | Payer: Medicare Other | Source: Ambulatory Visit | Attending: Vascular Surgery | Admitting: Vascular Surgery

## 2022-07-16 VITALS — BP 116/66 | HR 66 | Temp 98.6°F

## 2022-07-16 DIAGNOSIS — Z09 Encounter for follow-up examination after completed treatment for conditions other than malignant neoplasm: Secondary | ICD-10-CM

## 2022-07-16 DIAGNOSIS — I739 Peripheral vascular disease, unspecified: Secondary | ICD-10-CM | POA: Diagnosis not present

## 2022-07-16 LAB — VAS US ABI WITH/WO TBI
Left ABI: 1.5
Right ABI: 1.46

## 2022-07-16 NOTE — Progress Notes (Signed)
Office Note   History of Present Illness   Megan [name] is a 77 y.o. (Sep 06, 1945) female who presents for follow up. She recently required penumbra thrombectomy of the left popliteal artery, anterior tibial artery, tibioperoneal trunk, and peroneal artery on 06/02/2022 by Dr. Karin Lieu.  This was done to treat acute limb ischemia of the left lower extremity, likely caused by atrial fibrillation embolus not on anticoagulation therapy.  She returns today for follow-up.  At this time she does not appear to be ambulating and depends on her wheelchair for mobility.  She denies any rest pain or lower extremity wounds.  She is quite tearful at her visit due to "feeling sick and spitting up phlegm".  She states that she will be discharged from her nursing facility to go back home today or tomorrow.  She still has chronic bilateral lower extremity swelling due to lymphedema.  Current Outpatient Medications  Medication Sig Dispense Refill   albuterol (VENTOLIN HFA) 108 (90 Base) MCG/ACT inhaler INHALE 2 PUFFS BY MOUTH EVERY 6 HOURS AS NEEDED FOR WHEEZING OR SHORTNESS OF BREATH Strength 108 (90 Base) MCG/ACT 18 g 0   alum & mag hydroxide-simeth (MAALOX PLUS) 400-400-40 MG/5ML suspension Take 15 mLs by mouth every 6 (six) hours as needed for indigestion.     apixaban (ELIQUIS) 5 MG TABS tablet Take 1 tablet (5 mg total) by mouth 2 (two) times daily. 60 tablet 0   atorvastatin (LIPITOR) 80 MG tablet Take 1 tablet by mouth once daily 90 tablet 1   blood glucose meter kit and supplies Dispense based on patient and insurance preference. Use up to four times daily as directed. (FOR ICD-10 E10.9, E11.9). 1 each 0   clopidogrel (PLAVIX) 75 MG tablet Take 1 tablet (75 mg total) by mouth daily.     diclofenac Sodium (VOLTAREN) 1 % GEL Apply 4 g topically in the morning, at noon, and at bedtime.     diltiazem (CARDIZEM CD) 240 MG 24 hr capsule Take 1 capsule (240 mg total) by mouth daily. 90 capsule 2    furosemide (LASIX) 20 MG tablet Take 1 tablet (20 mg total) by mouth daily. Follow volume status, repeat labs 30 tablet 0   ipratropium-albuterol (DUONEB) 0.5-2.5 (3) MG/3ML SOLN Take 3 mLs by nebulization in the morning and at bedtime. 360 mL 0   levETIRAcetam (KEPPRA) 750 MG tablet Take 750 mg by mouth in the morning and at bedtime.     levothyroxine (SYNTHROID) 150 MCG tablet Take 1 tablet (150 mcg total) by mouth daily. 30 tablet 11   metoprolol tartrate (LOPRESSOR) 100 MG tablet Take 1 tablet by mouth twice daily 60 tablet 2   Multiple Vitamin (MULTIVITAMIN WITH MINERALS) TABS tablet Take 1 tablet by mouth daily.     nystatin (MYCOSTATIN) 100000 UNIT/ML suspension Take 5 mLs (500,000 Units total) by mouth 4 (four) times daily. Swish and swallow 60 mL 0   pantoprazole (PROTONIX) 40 MG tablet Take 1 tablet (40 mg total) by mouth daily at 12 noon.     polyethylene glycol powder (GLYCOLAX/MIRALAX) 17 GM/SCOOP powder Take 1 Container by mouth once.     Vitamin D, Ergocalciferol, (DRISDOL) 1.25 MG (50000 UNIT) CAPS capsule Take 1 capsule by mouth once a week (Patient taking differently: Take 50,000 Units by mouth every 7 (seven) days.) 12 capsule 1   No current facility-administered medications for this visit.    REVIEW OF SYSTEMS (negative unless checked):   Cardiac:   Chest pain or  chest pressure?  Shortness of breath upon activity?  Shortness of breath when lying flat?  Irregular heart rhythm?  Vascular:   Pain in calf, thigh, or hip brought on by walking?  Pain in feet at night that wakes you up from your sleep?  Blood clot in your veins?  Leg swelling?  Pulmonary:   Oxygen at home?  Productive cough?  Wheezing?  Neurologic:   Sudden weakness in arms or legs?  Sudden numbness in arms or legs?  Sudden onset of difficult speaking or slurred speech?  Temporary loss of vision in one eye?  Problems with dizziness?  Gastrointestinal:   Blood in  stool?  Vomited blood?  Genitourinary:   Burning when urinating?  Blood in urine?  Psychiatric:   Major depression  Hematologic:   Bleeding problems?  Problems with blood clotting?  Dermatologic:   Rashes or ulcers?  Constitutional:   Fever or chills?  Ear/Nose/Throat:   Change in hearing?  Nose bleeds?  Sore throat?  Musculoskeletal:   Back pain?  Joint pain?  Muscle pain?   Physical Examination   Vitals:   07/16/22 0846  BP: 116/66  Pulse: 66  Temp: 98.6 F (37 C)  SpO2: 98%   There is no height or weight on file to calculate BMI.  General:  WDWN in NAD; vital signs documented above Gait: Not observed, in wheelchair HENT: WNL, normocephalic Pulmonary: normal non-labored breathing Cardiac: irregularly irregular Abdomen: soft, NT, no masses Skin: without rashes Vascular Exam/Pulses: brisk monophasic left DP/peroneal doppler signals, brisk right DP doppler signal Extremities: without ischemic changes, wounds, or gangrene. 3+ edema of BLE and feet Musculoskeletal: no muscle wasting or atrophy  Neurologic: A&O X 3;  No focal weakness or paresthesias are detected Psychiatric:  The pt has Normal affect, slightly anxious  Non-Invasive Vascular imaging   ABI (07/16/2022) R:  ABI: 1.46 (1.7),  PT: bi DP: bi TBI:  0.70 (0.23) L:  ABI: 1.5 (0.61),  PT:  unable to obtain DP: mono TBI: 0.58 (0.00)   Medical Decision Making   Megan [name] is a 77 y.o. female who presents s/p mechanical thrombectomy of left popliteal artery, anterior tibial artery, tibioperoneal trunk, and peroneal artery on 06/02/2022  Based on the patient's vascular studies, her ABIs on the right are 1.46 with biphasic flow.  Her TBI is increased at 0.7.  Her ABIs on the left are increased from 0.61 to 1.5.  She has monophasic flow in the left DP/PT The patient is quite anxious and tearful this morning due to her current level of immobility.  She is also  frustrated that she has been "spitting up" phlegm the past few days.  She denies any fevers or chills or other respiratory symptoms. She currently only mobilizes via wheelchair.  She denies any rest pain or wounds of the lower extremities. On exam she has 3+ edema of bilateral lower extremities, with dorsal foot swelling.  Her swelling is likely due to chronic lymphedema and venous insufficiency.  She states that she has a hard time elevating her legs at her current facility, but when she goes home she is able to prop her legs up on a recliner. She has a brisk left AT signal, monophasic DP/PT signal.  She has a brisk right DP signal She can continue Eliquis, Plavix, and Statin. She can follow up with our office in 6 months with ABIs and LLE arterial duplex study   Loel Dubonnet PA-C [facility] Office:  [place]: [facility] [employer]

## 2022-07-16 NOTE — Patient Outreach (Signed)
[facility] Post- Acute Care Coordinator follow up. Mrs. Spargur resides in [facility]. Screening for potential [facility] care coordination services as benefit of health plan and PCP.  Update received from [place], [facility] psychotherapist. Mrs. Gautier will return home with family on tomorrow, Saturday, 07/17/22. Will have [facility].   Telephone call made to Alysiana Ethridge (son/DPR) at [phone]. Patient identifiers confirmed. Iantha Fallen was there at the facility with Mrs. Mcgough. Spoke with both Iantha Fallen and Daphine Deutscher on their speaker phone. Mrs. Fuhrmann endorses she still cannot walk. States she can stand briefly and tranfer but essentially wheelchair bound. States she will need transportation assistance to get to medical appointments.  Will be transported home by non-emergent transportation tomorrow. Mrs. Nicholes also states she was told that she will not qualify for Medicaid. Iantha Fallen (son) states he has been working on alternatives. States he plans to contact [place] transportation again for potential transportation in the future.   Discussed [facility] care coordination services and referral for transportation assistance. Both Iantha Fallen and Mrs. Staples are agreeable.   Will plan to make referral to [facility] care coordination team and for SDOH upon SNF discharge.   Both confirm best contact number is Kenneth's cell [phone] and home number [phone].   Raiford Noble, MSN, RN,BSN [facility] Post Acute Care Coordinator [phone] (Direct dial)

## 2022-07-19 ENCOUNTER — Telehealth: Payer: Self-pay | Admitting: Family

## 2022-07-19 ENCOUNTER — Other Ambulatory Visit: Payer: Self-pay | Admitting: *Deleted

## 2022-07-19 DIAGNOSIS — D72829 Elevated white blood cell count, unspecified: Secondary | ICD-10-CM | POA: Diagnosis not present

## 2022-07-19 DIAGNOSIS — I890 Lymphedema, not elsewhere classified: Secondary | ICD-10-CM | POA: Diagnosis not present

## 2022-07-19 DIAGNOSIS — R4189 Other symptoms and signs involving cognitive functions and awareness: Secondary | ICD-10-CM | POA: Diagnosis not present

## 2022-07-19 DIAGNOSIS — G4733 Obstructive sleep apnea (adult) (pediatric): Secondary | ICD-10-CM | POA: Diagnosis not present

## 2022-07-19 DIAGNOSIS — Z7902 Long term (current) use of antithrombotics/antiplatelets: Secondary | ICD-10-CM | POA: Diagnosis not present

## 2022-07-19 DIAGNOSIS — M439 Deforming dorsopathy, unspecified: Secondary | ICD-10-CM | POA: Diagnosis not present

## 2022-07-19 DIAGNOSIS — J449 Chronic obstructive pulmonary disease, unspecified: Secondary | ICD-10-CM | POA: Diagnosis not present

## 2022-07-19 DIAGNOSIS — I679 Cerebrovascular disease, unspecified: Secondary | ICD-10-CM | POA: Diagnosis not present

## 2022-07-19 DIAGNOSIS — Z993 Dependence on wheelchair: Secondary | ICD-10-CM | POA: Diagnosis not present

## 2022-07-19 DIAGNOSIS — I5032 Chronic diastolic (congestive) heart failure: Secondary | ICD-10-CM | POA: Diagnosis not present

## 2022-07-19 DIAGNOSIS — I441 Atrioventricular block, second degree: Secondary | ICD-10-CM | POA: Diagnosis not present

## 2022-07-19 DIAGNOSIS — I4821 Permanent atrial fibrillation: Secondary | ICD-10-CM

## 2022-07-19 DIAGNOSIS — Z950 Presence of cardiac pacemaker: Secondary | ICD-10-CM | POA: Diagnosis not present

## 2022-07-19 DIAGNOSIS — G40909 Epilepsy, unspecified, not intractable, without status epilepticus: Secondary | ICD-10-CM | POA: Diagnosis not present

## 2022-07-19 DIAGNOSIS — I700 Atherosclerosis of aorta: Secondary | ICD-10-CM | POA: Diagnosis not present

## 2022-07-19 DIAGNOSIS — I110 Hypertensive heart disease with heart failure: Secondary | ICD-10-CM | POA: Diagnosis not present

## 2022-07-19 DIAGNOSIS — E039 Hypothyroidism, unspecified: Secondary | ICD-10-CM | POA: Diagnosis not present

## 2022-07-19 DIAGNOSIS — E782 Mixed hyperlipidemia: Secondary | ICD-10-CM | POA: Diagnosis not present

## 2022-07-19 DIAGNOSIS — Z556 Problems related to health literacy: Secondary | ICD-10-CM | POA: Diagnosis not present

## 2022-07-19 DIAGNOSIS — I739 Peripheral vascular disease, unspecified: Secondary | ICD-10-CM | POA: Diagnosis not present

## 2022-07-19 DIAGNOSIS — F419 Anxiety disorder, unspecified: Secondary | ICD-10-CM | POA: Diagnosis not present

## 2022-07-19 NOTE — Patient Outreach (Addendum)
[facility] Post- Acute Care Coordinator follow up.  Screening for potential [facility] care coordination services as a benefit of health plan and PCP.  Verified in [facility] Mrs. Megan [name] discharged from [facility] on 07/17/22.   Writer previously confirmed with Megan [name] Rehab social worker, Megan [name] will have [facility]. Writer spoke with Megan [name] and [name] on 07/16/22. Please see 07/16/22 notes for details.   Will refer for [facility] RN for care coordination and will make referral for SDOH assist for transportation.   Best contact number is home phone [phone] or [name] [phone].  Reviewed chart and noted Megan [name] does have prescription benefits. Called Megan [name] who reports she has to pay out of pocket. Discussed referral for Pharmacist referral as well. Megan [name] agreeable.   Megan Noble, MSN, RN,BSN [facility] Post Acute Care Coordinator [phone] (Direct dial)

## 2022-07-19 NOTE — Telephone Encounter (Signed)
Patient calling because she has been discharged from the [facility] Saturday 4/20.  She is complaining with her stomach and spitting up a lot.  Says she is supposed to be getting a referral to stomach doctor??  Not sure what to do as she cannot walk and does not have transportation.  Looking at her notes, it looks like [facility] followed up on her discharge and patient is to have [facility] set up for her.  Routing to provider to make sure of what she should do.

## 2022-07-19 NOTE — Telephone Encounter (Signed)
Patient states she will call back once she can find a ride to bring her in here.

## 2022-07-19 NOTE — Telephone Encounter (Signed)
PT needs hospital follow up. If she can not make appointments she may need to think about a long term placement.

## 2022-07-20 ENCOUNTER — Telehealth: Payer: Self-pay | Admitting: Cardiovascular Disease

## 2022-07-20 ENCOUNTER — Telehealth: Payer: Self-pay

## 2022-07-20 ENCOUNTER — Ambulatory Visit: Payer: Medicare Other

## 2022-07-20 ENCOUNTER — Telehealth: Payer: Self-pay | Admitting: Family

## 2022-07-20 ENCOUNTER — Telehealth: Payer: Self-pay | Admitting: *Deleted

## 2022-07-20 ENCOUNTER — Ambulatory Visit: Payer: Self-pay | Admitting: *Deleted

## 2022-07-20 DIAGNOSIS — I110 Hypertensive heart disease with heart failure: Secondary | ICD-10-CM | POA: Diagnosis not present

## 2022-07-20 DIAGNOSIS — I739 Peripheral vascular disease, unspecified: Secondary | ICD-10-CM | POA: Diagnosis not present

## 2022-07-20 DIAGNOSIS — I5032 Chronic diastolic (congestive) heart failure: Secondary | ICD-10-CM | POA: Diagnosis not present

## 2022-07-20 DIAGNOSIS — J449 Chronic obstructive pulmonary disease, unspecified: Secondary | ICD-10-CM | POA: Diagnosis not present

## 2022-07-20 DIAGNOSIS — I679 Cerebrovascular disease, unspecified: Secondary | ICD-10-CM | POA: Diagnosis not present

## 2022-07-20 DIAGNOSIS — D72829 Elevated white blood cell count, unspecified: Secondary | ICD-10-CM | POA: Diagnosis not present

## 2022-07-20 NOTE — Telephone Encounter (Signed)
Pt calling back and asked that you send her a letter with new date and time, if possible

## 2022-07-20 NOTE — Telephone Encounter (Signed)
Pt states they cannot speak to transportation until tomorrow. Aware I am not  in the office tomorrow, but we agreed to speak by the end of week about this. She is agreeable to plan.

## 2022-07-20 NOTE — Telephone Encounter (Signed)
   Telephone encounter was:  Successful.  07/20/2022 Name: Megan [name] MRN: [id number] DOB: 25-Jun-1945  Megan [name] is a 77 y.o. year old female who is a primary care patient of Junie Spencer, FNP . The community resource team was consulted for assistance with Transportation Needs   Care guide performed the following interventions: Patient provided with information about care guide support team and interviewed to confirm resource needs.Patient stated she is in need for transportation and a ramp so she can get in and out of the house. I have mailed and gave information over the phone for the resources in her county   Follow Up Plan:  No further follow up planned at this time. The patient has been provided with needed resources.    Lenard Forth [facility] Guide, [facility] [id number] [address] [place], [place], [place] [address] Phone: [phone] Email: [email or web address]

## 2022-07-20 NOTE — Telephone Encounter (Signed)
Lmtcb.

## 2022-07-20 NOTE — Telephone Encounter (Signed)
Pt is scheduled for a procedure on 4/29 at 7:30am and she stated she has to arrive by 5:30am. She's unable to find transportation so she's needing some assistance or needs to have her appt rescheduled for a later time. Pt is requesting a callback to see what her options are because she stated it's very important that she has her battery replaced in her pace maker. Please advise.

## 2022-07-20 NOTE — Telephone Encounter (Signed)
[facility] is calling to verify fax was received for this patient in regards to discharge from their facility.

## 2022-07-20 NOTE — Telephone Encounter (Signed)
Nothing received as of 4:50 today.

## 2022-07-20 NOTE — Telephone Encounter (Signed)
Pt informed that her procedure is not moving days, but we did move her procedure to 3rd case that day. Aware she will need to arrive at 10 am. Pt understands I will follow back up later this afternoon to confirm she was able to arrange transportation. Pt agreeable to plan.

## 2022-07-20 NOTE — Telephone Encounter (Signed)
Followed up w/ son who reports pt was in rehab, until this past Saturday.  The arrangement was rehab was taking pt but now that she is released the family is arranging private medical transport for procedure. Son has not been able to speak with them to confirm arrangement. Son aware I will follow up later today to see if they were able to arrange transport.  Aware I will move her procedure to 3rd case that day and will let him know time when call him back.

## 2022-07-20 NOTE — Patient Outreach (Addendum)
  Care Coordination   07/20/2022 Name: Megan [name] MRN: [id number] DOB: [birth year]   Care Coordination Outreach Attempts:  An unsuccessful telephone outreach was attempted today to offer the patient information about available care coordination services as a benefit of their health plan.   Follow Up Plan:  Additional outreach attempts will be made to offer the patient care coordination information and services.   Encounter Outcome:  No Answer   Care Coordination Interventions:  No, not indicated    Annetta Deiss L. Noelle Penner, RN, BSN, CCM [facility] Care Management Community Coordinator Office number [phone]

## 2022-07-20 NOTE — Progress Notes (Signed)
  Care Coordination   Note   07/20/2022 Name: [name] MRN: [id number] DOB: 02-Mar-1946  [name] is a 77 y.o. year old female who sees Lendon Colonel, Edilia Bo, FNP for primary care. I reached out to [name] by phone today to offer care coordination services.  [name] was given information about Care Coordination services today including:   The Care Coordination services include support from the care team which includes your Nurse Coordinator, Clinical Social Worker, or Pharmacist.  The Care Coordination team is here to help remove barriers to the health concerns and goals most important to you. Care Coordination services are voluntary, and the patient may decline or stop services at any time by request to their care team member.   Care Coordination Consent Status: Patient agreed to services and verbal consent obtained.   Follow up plan:  Telephone appointment with care coordination team member scheduled for:  [facility] on 07/20/22 and SW 07/22/22  Encounter Outcome:  Pt. Scheduled  [facility]  Care Coordination Care Guide  Direct Dial: [phone]

## 2022-07-20 NOTE — Patient Outreach (Signed)
Care Coordination   Follow Up Visit Note   07/20/2022 Name: Megan [name] MRN: [id number] DOB: Jun 04, 1945  Megan [name] is a 77 y.o. year old female who sees Megan [name], Megan Bo, FNP for primary care. I spoke with  Megan [name] by phone today.  What matters to the patients health and wellness today?  She needs to get her pacemaker on 07/26/22 10 am and needs transportation to get to appointment Can not get in and out of her house with wheelchair Still have difficulty getting in a car even if she manages to get out of the home. She reports she is wheelchair bound She also states she needs a ramp. She was provided information over the phone by a [facility] care guide but reports she called about the ramp and the number was incorrect Came home from [facility] skilled nursing facility (snf) on a stretcher on 07/18/22. Can not walk  At SNF she tried but still is unable to walk She can stand 20 seconds  Patient has spoken with the local transportation staff & On 07/21/22 she will speak again with Megan [name], transportation of [place] - scheduler   Has home health care- [facility]  Want personal care services- No medicaid- She states she is aware that she has too many assets and "Make too much" A friend  Megan [name] will be visiting her to be one home care provider  Medications trying to get Eliquis (history of a blood clot)  On 07/18/22, Sunday she was informed Eliquis would cost $600+ at her Local She reports her pharmacy staff informed her they will try finding a discount card    Goals Addressed             This Visit's Progress    [facility] care coordination services   Not on track    Interventions Today    Flowsheet Row Most Recent Value  Chronic Disease   Chronic disease during today's visit Other  [needs to get her pacemaker on 07/26/22 10 am and needs transportation to get to appointment , ramp, personal care services, cost of Eliquis]  General Interventions   General Interventions  Discussed/Reviewed Doctor Visits, General Interventions Discussed  Doctor Visits Discussed/Reviewed Doctor Visits Discussed, Specialist  PCP/Specialist Visits Compliance with follow-up visit  [need assist to get to appointment for her pacemaker on 07/26/22. Active with [facility] Care guide]  Education Interventions   Education Provided Provided Education  Provided Verbal Education On Community Resources  Mental Health Interventions   Mental Health Discussed/Reviewed Mental Health Discussed, Coping Strategies  Pharmacy Interventions   Pharmacy Dicussed/Reviewed --  [Can not afford Eliquis]  Safety Interventions   Safety Discussed/Reviewed Fall Risk, Safety Discussed              SDOH assessments and interventions completed:  Yes  SDOH Interventions Today    Flowsheet Row Most Recent Value  SDOH Interventions   Food Insecurity Interventions Intervention Not Indicated  Utilities Interventions Intervention Not Indicated  Financial Strain Interventions Intervention Not Indicated  Stress Interventions Intervention Not Indicated       Patient Active Problem List   Diagnosis Date Noted   CVA (cerebral vascular accident) 07/06/2022   Leg pain 06/01/2022   Traumatic subdural hemorrhage, initial encounter 08/16/2021   DNR (do not resuscitate) 06/12/2021   Possible Lytic bone lesions on CT 06/12/2021   Hyperglycemia 05/18/2021   Chronic congestive heart failure 08/24/2019   Intertrigo 05/11/2017   OAB (overactive bladder) 02/22/2017   Lymphedema 01/26/2017  Gastroesophageal reflux disease without esophagitis 01/26/2017   Acquired hypothyroidism 01/26/2017   Mixed hyperlipidemia 01/26/2017   Generalized weakness 08/17/2015   Leukocytosis 08/17/2015   Pyrexia    Sleeps in sitting position due to orthopnea 07/31/2015   Morbid obesity due to excess calories 06/09/2015   PVD (peripheral vascular disease) 06/09/2015   Long term (current) use of anticoagulants 06/19/2010    Tachycardia-bradycardia syndrome (HCC) 11/18/2008   PPM-St.Jude 11/18/2008   HYPERTENSION, BENIGN 02/21/2008     Care Coordination Interventions:  Yes, provided   Follow up plan: Follow up call scheduled for 07/22/22    Encounter Outcome:  Pt. Visit Completed   Lacey Dotson L. Noelle Penner, RN, BSN, CCM [facility] Care Management Community Coordinator Office number [phone]

## 2022-07-20 NOTE — Patient Instructions (Signed)
Visit Information  Thank you for taking time to visit with me today. Please don't hesitate to contact me if I can be of assistance to you.   Following are the goals we discussed today:   Goals Addressed             This Visit's Progress    [facility] care coordination services   Not on track    Interventions Today    Flowsheet Row Most Recent Value  Chronic Disease   Chronic disease during today's visit Other  [needs to get her pacemaker on 07/26/22 10 am and needs transportation to get to appointment , ramp, personal care services, cost of Eliquis]  General Interventions   General Interventions Discussed/Reviewed Doctor Visits, General Interventions Discussed  Doctor Visits Discussed/Reviewed Doctor Visits Discussed, Specialist  PCP/Specialist Visits Compliance with follow-up visit  [need assist to get to appointment for her pacemaker on 07/26/22. Active with [facility] Care guide]  Education Interventions   Education Provided Provided Education  Provided Verbal Education On Community Resources  Mental Health Interventions   Mental Health Discussed/Reviewed Mental Health Discussed, Coping Strategies  Pharmacy Interventions   Pharmacy Dicussed/Reviewed --  [Can not afford Eliquis]  Safety Interventions   Safety Discussed/Reviewed Fall Risk, Safety Discussed              Our next appointment is by telephone on 07/22/22 at 1:30 pm  Please call the care guide team at [phone] if you need to cancel or reschedule your appointment.   If you are experiencing a Mental Health or Behavioral Health Crisis or need someone to talk to, please call the Suicide and Crisis Lifeline: 988 call the [place] National Suicide Prevention Lifeline: 941-787-5844 or TTY: 703-262-2302 TTY 419 019 9938) to talk to a trained counselor call 1-800-273-TALK (toll free, 24 hour hotline) call the [facility]: 787-395-4397 call 911   The patient verbalized understanding of instructions,  educational materials, and care plan provided today and DECLINED offer to receive copy of patient instructions, educational materials, and care plan.   The patient has been provided with contact information for the care management team and has been advised to call with any health related questions or concerns.   Hugo Lybrand L. Noelle Penner, RN, BSN, CCM [facility] Care Management Community Coordinator Office number [phone]

## 2022-07-21 ENCOUNTER — Telehealth: Payer: Self-pay | Admitting: Cardiovascular Disease

## 2022-07-21 NOTE — Telephone Encounter (Signed)
Pt calling in to have a direct address and entrance letter/number for her surgery 07/26/22. She needs this information to give her transportation.

## 2022-07-21 NOTE — Telephone Encounter (Signed)
Spoke with Ander Slade and advised that no records received from [facility] and says she will inform Venica.

## 2022-07-21 NOTE — Telephone Encounter (Signed)
Returned call to patient.  Confirmed location, date and time of PPM procedure:  [facility] [address] [place], [place] [address]  Main Entrance A, then go to Admitting desk.  Procedure scheduled for 07/26/22 at 12:00pm, arrival time will be 10:00am.  Patient verbalized understanding and expressed appreciation for call.

## 2022-07-22 ENCOUNTER — Encounter: Payer: Self-pay | Admitting: *Deleted

## 2022-07-22 ENCOUNTER — Ambulatory Visit: Payer: Self-pay | Admitting: *Deleted

## 2022-07-22 ENCOUNTER — Telehealth: Payer: Self-pay | Admitting: Family

## 2022-07-22 NOTE — Patient Outreach (Signed)
  Care Coordination   Follow Up Visit Note   06/14/2023 Name: Megan [name] MRN: [id number] DOB: [birth year]  Megan [name] is a 77 y.o. year old female who sees Lendon Colonel, Edilia Bo, FNP for primary care. I spoke with  [name] by phone today.  What matters to the patients health and wellness today?  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 SDH Keeps a note book to help remember [facility] visits 07/23/22 2 staff members  Only taking plavix not Eliquis    Goals Addressed             This Visit's Progress    [facility] care coordination services       Interventions Today    Flowsheet Row Most Recent Value  Chronic Disease   Chronic disease during today's visit Other  [SW referral, memory, home health services, confirmed transportation]  General Interventions   General Interventions Discussed/Reviewed General Interventions Reviewed, [employer], Doctor Visits  Doctor Visits Discussed/Reviewed Doctor Visits Reviewed, Specialist, PCP  PCP/Specialist Visits Compliance with follow-up visit  Exercise Interventions   Exercise Discussed/Reviewed Exercise Reviewed, Physical Activity  Physical Activity Discussed/Reviewed Physical Activity Discussed  Education Interventions   Education Provided Provided Education  [place], [facility] SW services, suggest keeping a note book to assist with memory concerns, home health]  Provided Verbal Education On Medication, [employer], Sick Day Rules, Other  Mental Health Interventions   Mental Health Discussed/Reviewed Mental Health Discussed, Coping Strategies  Nutrition Interventions   Nutrition Discussed/Reviewed Nutrition Discussed, Fluid intake  Pharmacy Interventions   Pharmacy Dicussed/Reviewed Pharmacy Topics Discussed, Medications and their  functions, Affording Medications              SDOH assessments and interventions completed:  No     Care Coordination Interventions:  Yes, provided   Follow up plan: Follow up call scheduled for pending SW interventions    Encounter Outcome:  Pt. Visit Completed   Lanee Chain L. Noelle Penner, RN, BSN, CCM [facility] Care Management Community Coordinator Office number [phone]

## 2022-07-22 NOTE — Patient Instructions (Signed)
Visit Information  Thank you for taking time to visit with me today. Please don't hesitate to contact me if I can be of assistance to you.   Following are the goals we discussed today:   Goals Addressed               This Visit's Progress     Receive Assistance Applying for Medicaid & Personal Care Services. (pt-stated)   On track     Care Coordination Interventions:  Interventions Today    Flowsheet Row Most Recent Value  Chronic Disease   Chronic disease during today's visit Congestive Heart Failure (CHF), Hypertension (HTN), Other  [Morbid Obesity, Inability to Perform Activities of Daily Living Independently, Caregiver Stress, Needs Wheelchair Ramp Installed for Mobility & Safety & Generalized Weakness.]  General Interventions   General Interventions Discussed/Reviewed General Interventions Discussed, General Interventions Reviewed, Annual Eye Exam, Labs, Durable Medical Equipment (DME), [employer], Level of Care, Communication with, Health Screening, Doctor Visits, Vaccines  [Communication with Primary Care Provider]  Labs Kidney Function  [Encouraged]  Vaccines COVID-19, Flu, Pneumonia, RSV, Shingles, Tetanus/Pertussis/Diphtheria  [Encouraged]  Doctor Visits Discussed/Reviewed Doctor Visits Discussed, Specialist, Doctor Visits Reviewed, Annual Wellness Visits, PCP  [Encouraged]  Health Screening Bone Density, Colonoscopy, Mammogram  [Encouraged]  Durable Medical Equipment (DME) Wheelchair  Wheelchair Standard  PCP/Specialist Visits Compliance with follow-up visit  [Encouraged]  Communication with PCP/Specialists, RN  Level of Care Adult Daycare, Personal Care Services, Applications, Assisted Living, Skilled Nursing Facility  [Encouraged]  Applications Medicaid, Personal Care Services  [Encouraged]  Exercise Interventions   Exercise Discussed/Reviewed Exercise Discussed, Assistive device use and maintanence, Exercise Reviewed, Physical Activity  [Encouraged]  Physical  Activity Discussed/Reviewed Physical Activity Discussed, Home Exercise Program (HEP), Physical Activity Reviewed, Types of exercise  [Encouraged]  Education Interventions   Education Provided Provided Therapist, sports, Provided Web-based Education, Provided Education  Provided Verbal Education On Nutrition, Mental Health/Coping with Illness, When to see the doctor, [employer], [employer], Medication, Exercise, Applications, Eye Care  [Encouraged]  Applications Medicaid, Personal Care Services  [Encouraged]  Mental Health Interventions   Mental Health Discussed/Reviewed Mental Health Discussed, Anxiety, Depression, Grief and Loss, Mental Health Reviewed, Coping Strategies, Substance Abuse, Suicide, Other, Crisis  [Domestic Violence]  Nutrition Interventions   Nutrition Discussed/Reviewed Nutrition Discussed, Nutrition Reviewed, Carbohydrate meal planning, Decreasing sugar intake, Decreasing salt, Portion sizes, Decreasing fats, Fluid intake, Adding fruits and vegetables, Increaing proteins  [Encouraged]  Pharmacy Interventions   Pharmacy Dicussed/Reviewed Pharmacy Topics Discussed, Medication Adherence, Affording Medications, Pharmacy Topics Reviewed  [Encouraged]  Safety Interventions   Safety Discussed/Reviewed Safety Discussed, Safety Reviewed, Fall Risk, Home Safety  [Encouraged]  Home Safety Assistive Devices, Need for home safety assessment, Refer for community resources  [Encouraged]  Advanced Directive Interventions   Advanced Directives Discussed/Reviewed Advanced Directives Discussed  [Completion Encouraged]     Assessed Social Determinant of Health Barriers. Discussed Plans for Ongoing Care Management Follow Up. Provided Careers information officer Information for Care Management Team Members. Screened for Signs & Symptoms of Depression, Related to Chronic Disease State.  PHQ2 & PHQ9 Depression Screen Completed & Results Reviewed.  Suicidal Ideation & Homicidal Ideation Assessed -  None Present.   Domestic Violence Assessed - None Present. Access to Weapons Assessed - None Present.   Active Listening & Reflection Utilized.  Verbalization of Feelings Encouraged.  Emotional Support Provided. Feelings of Sadness Regarding Loss of Independence Validated. Caregiver Stress Acknowledged. Caregiver Resources Reviewed. Caregiver Support Groups Provided. Self-Enrollment in Caregiver Support Group of Interest Emphasized.  Crisis Support Information, Agencies, Services & Resources Discussed. Problem Solving Interventions Identified. Task-Centered Solutions Implemented.   Solution-Focused Strategies Developed. Acceptance & Commitment Therapy Introduced. Brief Cognitive Behavioral Therapy Initiated. Client-Centered Therapy Enacted. Reviewed Prescription Medications & Discussed Importance of Compliance. Quality of Sleep Assessed & Sleep Hygiene Techniques Promoted. Discussed Higher Level of Care Options (I.e. Memory Care Assisted Living Facility, Extended Care Facility, Skilled Nursing Facility) & Encouraged Consideration. Verified No In-Home Care Services, [employer], Warden/ranger, Etc., Covered Under [employer] through [employer].  Reviewed Materials engineer through [employer] & AARP & Encouraged Completion of Medicaid Application & Submission to The [facility] of Social Services [phone]), for Processing. Verified No Long-Term Care Insurance Benefits, Secondary Insurance Policies, Plans, Coverage, Etc.  Confirmed You, Nor Deceased Husband Were Veterans, Making You Ineligible to Apply for Aid & Attendance Benefits, Through [employer]. Please Review The Following List of [employer], [employer], [employer], Mailed on 07/22/2022:     ~ 2023 Medicaid Tips     ~ Medicaid Application      ~ Making Medicaid Accessible: [place] Offers Walk-In Assistance         for Patients      ~ Apply for Medicaid On-Line     ~ Personal Care Services Application     ~ Personal Care Services Providers Encouraged Completion of Application for Medicaid & Submission to The [facility] of Social Services [phone]) for Processing, Notifying CSW [phone]# [phone]) If You Require Assistance with Application Completion & Submission. Encouraged Attendance at [facility] to Receive Assistance with Completion of Medicaid Application & Submission to The [facility] of Social Services [phone]), for Processing. Explained Process for Applying for Personal Care Services, through [facility] [phone]) & Agreed to Assist with Application Completion & Submission, if Eligible for Medicaid, through The [facility] of Social Services [phone]). Please Review the Following List of [employer], Services & Resources in [place], Nurse, learning disability, Mailed on 07/22/2022: ~ [employer] Independent Living Rehabilitation Program - #     [phone] ~ [employer] Alternative Program for Disabled Adults, through Aging,     Disability & Transit Services of [place] - # [phone] ~ [facility] Men's Association - # [phone] Ext. [phone] ~ [employer] Accessibility - # [phone] ~ [facility] - # [phone] Please Begin Education officer, community, Services & Resources of Interest in [place], Nurse, learning disability, in An Effort to [employer] & Secure Mobility. Encouraged Careers information officer with CSW (# [phone]), if You Have Questions, Need Assistance, or If Additional Social Work Needs Are Identified Between Now & Our Next Scheduled Follow-Up Outreach Call.      Our next appointment is by telephone on 08/05/2022 at 11:45 am.  Please call the care guide team at [phone] if you need to cancel or reschedule your  appointment.   If you are experiencing a Mental Health or Behavioral Health Crisis or need someone to talk to, please call the Suicide and Crisis Lifeline: 988 call the [place] National Suicide Prevention Lifeline: 775-496-1110 or TTY: (223)193-5358 TTY (442) 711-1225) to talk to a trained counselor call 1-800-273-TALK (toll free, 24 hour hotline) go to [facility] [address] [place] [phone]) call the [facility] Crisis Line: [phone] call 911  Patient verbalizes understanding of instructions and care plan provided today and agrees to view in MyChart. Active  MyChart status and patient understanding of how to access instructions and care plan via MyChart confirmed with patient.     Telephone follow up appointment with care management team member scheduled for:  08/05/2022 at 11:45 am.  Danford Bad, BSW, MSW, LCSW  Licensed Clinical Social Worker  [facility]  Mailing [place]. [address] [place], [place] [address] Physical Address-[address] [place], [place] [address] Toll Free Main # [phone] Fax # [phone] Cell # [phone] [email or web address]

## 2022-07-22 NOTE — Telephone Encounter (Signed)
Pt reviewed with nurse yesterday and understands when to arrive. They were able to arrange transportation.  [facility] faxing blood work to office today.

## 2022-07-22 NOTE — Telephone Encounter (Addendum)
Returned call to patient.  Patient states she was returning a call, someone had called requesting to go over her medication list. Informed patient I do not see a note where anyone has called her regarding this matter.  Patient verbalized understanding and ended call to take another call she had coming in.

## 2022-07-22 NOTE — Telephone Encounter (Signed)
Pt needs hospital follow up visit.   Jannifer Rodney, FNP

## 2022-07-22 NOTE — Telephone Encounter (Signed)
Patient states she can not make appointment at this time.

## 2022-07-22 NOTE — Telephone Encounter (Signed)
Patient aware and verbalized understanding. °

## 2022-07-22 NOTE — Patient Outreach (Signed)
Care Coordination   Follow Up Visit Note   07/22/2022  Name: Megan [name] MRN: [id number] DOB: 04/03/1945  Megan [name] is a 77 y.o. year old female who sees Lendon Colonel, Edilia Bo, FNP for primary care. I spoke with [name] by phone today.  What matters to the patients health and wellness today?  Receive Assistance Applying for Medicaid & Personal Care Services.   Goals Addressed               This Visit's Progress     Receive Assistance Applying for Medicaid & Personal Care Services. (pt-stated)   On track     Care Coordination Interventions:  Interventions Today    Flowsheet Row Most Recent Value  Chronic Disease   Chronic disease during today's visit Congestive Heart Failure (CHF), Hypertension (HTN), Other  [Morbid Obesity, Inability to Perform Activities of Daily Living Independently, Caregiver Stress, Needs Wheelchair Ramp Installed for Mobility & Safety & Generalized Weakness.]  General Interventions   General Interventions Discussed/Reviewed General Interventions Discussed, General Interventions Reviewed, Annual Eye Exam, Labs, Durable Medical Equipment (DME), [employer], Level of Care, Communication with, Health Screening, Doctor Visits, Vaccines  [Communication with Primary Care Provider]  Labs Kidney Function  [Encouraged]  Vaccines COVID-19, Flu, Pneumonia, RSV, Shingles, Tetanus/Pertussis/Diphtheria  [Encouraged]  Doctor Visits Discussed/Reviewed Doctor Visits Discussed, Specialist, Doctor Visits Reviewed, Annual Wellness Visits, PCP  [Encouraged]  Health Screening Bone Density, Colonoscopy, Mammogram  [Encouraged]  Durable Medical Equipment (DME) Wheelchair  Wheelchair Standard  PCP/Specialist Visits Compliance with follow-up visit  [Encouraged]  Communication with PCP/Specialists, RN  Level of Care Adult Daycare, Personal Care Services, Applications, Assisted Living, Skilled Nursing Facility  [Encouraged]  Applications Medicaid, Personal Care  Services  [Encouraged]  Exercise Interventions   Exercise Discussed/Reviewed Exercise Discussed, Assistive device use and maintanence, Exercise Reviewed, Physical Activity  [Encouraged]  Physical Activity Discussed/Reviewed Physical Activity Discussed, Home Exercise Program (HEP), Physical Activity Reviewed, Types of exercise  [Encouraged]  Education Interventions   Education Provided Provided Therapist, sports, Provided Web-based Education, Provided Education  Provided Verbal Education On Nutrition, Mental Health/Coping with Illness, When to see the doctor, [employer], [employer], Medication, Exercise, Applications, Eye Care  [Encouraged]  Applications Medicaid, Personal Care Services  [Encouraged]  Mental Health Interventions   Mental Health Discussed/Reviewed Mental Health Discussed, Anxiety, Depression, Grief and Loss, Mental Health Reviewed, Coping Strategies, Substance Abuse, Suicide, Other, Crisis  [Domestic Violence]  Nutrition Interventions   Nutrition Discussed/Reviewed Nutrition Discussed, Nutrition Reviewed, Carbohydrate meal planning, Decreasing sugar intake, Decreasing salt, Portion sizes, Decreasing fats, Fluid intake, Adding fruits and vegetables, Increaing proteins  [Encouraged]  Pharmacy Interventions   Pharmacy Dicussed/Reviewed Pharmacy Topics Discussed, Medication Adherence, Affording Medications, Pharmacy Topics Reviewed  [Encouraged]  Safety Interventions   Safety Discussed/Reviewed Safety Discussed, Safety Reviewed, Fall Risk, Home Safety  [Encouraged]  Home Safety Assistive Devices, Need for home safety assessment, Refer for community resources  [Encouraged]  Advanced Directive Interventions   Advanced Directives Discussed/Reviewed Advanced Directives Discussed  [Completion Encouraged]     Assessed Social Determinant of Health Barriers. Discussed Plans for Ongoing Care Management Follow Up. Provided Careers information officer Information for Care Management Team  Members. Screened for Signs & Symptoms of Depression, Related to Chronic Disease State.  PHQ2 & PHQ9 Depression Screen Completed & Results Reviewed.  Suicidal Ideation & Homicidal Ideation Assessed - None Present.   Domestic Violence Assessed - None Present. Access to Weapons Assessed - None Present.   Active Listening & Reflection Utilized.  Verbalization  of Feelings Encouraged.  Emotional Support Provided. Feelings of Sadness Regarding Loss of Independence Validated. Caregiver Stress Acknowledged. Caregiver Resources Reviewed. Caregiver Support Groups Provided. Self-Enrollment in Caregiver Support Group of Interest Emphasized. Crisis Support Information, Agencies, Services & Resources Discussed. Problem Solving Interventions Identified. Task-Centered Solutions Implemented.   Solution-Focused Strategies Developed. Acceptance & Commitment Therapy Introduced. Brief Cognitive Behavioral Therapy Initiated. Client-Centered Therapy Enacted. Reviewed Prescription Medications & Discussed Importance of Compliance. Quality of Sleep Assessed & Sleep Hygiene Techniques Promoted. Discussed Higher Level of Care Options (I.e. Memory Care Assisted Living Facility, Extended Care Facility, Skilled Nursing Facility) & Encouraged Consideration. Verified No In-Home Care Services, [employer], Warden/ranger, Etc., Covered Under [employer] through [employer].  Reviewed Materials engineer through [employer] & AARP & Encouraged Completion of Medicaid Application & Submission to The [facility] of Social Services [phone]), for Processing. Verified No Long-Term Care Insurance Benefits, Secondary Insurance Policies, Plans, Coverage, Etc.  Confirmed You, Nor Deceased Husband Were Veterans, Making You Ineligible to Apply for Aid & Attendance Benefits, Through CIGNA. Please Review The Following List of [employer],  [employer], [employer], Mailed on 07/22/2022: ~ 2023 Medicaid Tips ~ Medicaid Application  ~ Making Medicaid Accessible: [place] Offers Walk-In Assistance      for Patients ~ Apply for Medicaid On-Line ~ Personal Care Services Application ~ Personal Care Services Providers Encouraged Completion of Application for Medicaid & Submission to The [facility] of Social Services [phone]) for Processing, Notifying CSW [phone]# [phone]) If You Require Assistance with Application Completion & Submission. Encouraged Attendance at [facility] to Receive Assistance with Completion of Medicaid Application & Submission to The [facility] of Social Services [phone]), for Processing. Explained Process for Applying for Personal Care Services, through [facility] [phone]) & Agreed to Assist with Application Completion & Submission, if Eligible for Medicaid, through The [facility] of Social Services [phone]). Please Review the Following List of [employer], Services & Resources in [place], Nurse, learning disability, Mailed on 07/22/2022:    ~ [employer] Independent Living Rehabilitation Program - #        [phone]    ~ [employer] Alternative Program for Disabled Adults, through Aging,        Disability & Transit Services of [place] - # [phone]    ~ [facility] Men's Association - # [phone] Ext. [phone]                ~ [employer] Accessibility - # [phone]    ~ [facility] - # [phone] Please Begin Education officer, community, Services & Resources of Interest in [place], Nurse, learning disability, in An Effort to [employer] & Secure Mobility. Encouraged Careers information officer with CSW (# [phone]), if You Have Questions, Need Assistance, or If Additional Social Work Needs Are Identified Between Now &  Our Next Scheduled Follow-Up Outreach Call.        SDOH assessments and interventions completed:  Yes.  SDOH Interventions Today    Flowsheet Row Most Recent Value  SDOH Interventions   Food Insecurity Interventions Intervention Not Indicated  Housing Interventions Intervention Not Indicated  Transportation Interventions Intervention Not Indicated, Patient Resources (Friends/Family)  Utilities Interventions Intervention Not Indicated  Alcohol Usage Interventions Intervention Not Indicated (Score <7)  Depression Interventions/Treatment  Patient refuses Treatment  Financial Strain Interventions Intervention Not Indicated  Physical Activity Interventions Patient Refused  Stress Interventions Intervention Not Indicated  Social Connections Interventions Patient Refused     Care Coordination Interventions:  Yes, provided.   Follow up plan: Follow up call scheduled for 08/05/2022 at 11:45 am.  Encounter Outcome:  Pt. Visit Completed.   Danford Bad, BSW, MSW, LCSW  Licensed Restaurant manager, [facility]  Mailing [place] [address] [place], [place] [address] Physical Address-[address], [place], [place] [address] Toll Free Main # [phone] Fax # [phone] Cell # [phone] [email or web address]

## 2022-07-22 NOTE — Telephone Encounter (Signed)
Patient calling back with question. Please advise

## 2022-07-23 DIAGNOSIS — J449 Chronic obstructive pulmonary disease, unspecified: Secondary | ICD-10-CM | POA: Diagnosis not present

## 2022-07-23 DIAGNOSIS — I679 Cerebrovascular disease, unspecified: Secondary | ICD-10-CM | POA: Diagnosis not present

## 2022-07-23 DIAGNOSIS — D72829 Elevated white blood cell count, unspecified: Secondary | ICD-10-CM | POA: Diagnosis not present

## 2022-07-23 DIAGNOSIS — I110 Hypertensive heart disease with heart failure: Secondary | ICD-10-CM | POA: Diagnosis not present

## 2022-07-23 DIAGNOSIS — I5032 Chronic diastolic (congestive) heart failure: Secondary | ICD-10-CM | POA: Diagnosis not present

## 2022-07-23 DIAGNOSIS — I739 Peripheral vascular disease, unspecified: Secondary | ICD-10-CM | POA: Diagnosis not present

## 2022-07-23 NOTE — Pre-Procedure Instructions (Signed)
Instructed patient on the following items: Arrival time 1000 Nothing to eat or drink after midnight No meds AM of procedure Responsible person to drive you home and stay with you for 24 hrs Wash with special soap night before and morning of procedure If on anti-coagulant drug instructions Eliquis- patient is not currently taken  due to cost.  Will hold Plavix on Sunday

## 2022-07-26 ENCOUNTER — Ambulatory Visit (HOSPITAL_COMMUNITY)
Admission: RE | Admit: 2022-07-26 | Discharge: 2022-07-26 | Disposition: A | Discharge status: Home / Self Care | Payer: Medicare Other | Attending: Cardiovascular Disease | Admitting: Cardiovascular Disease

## 2022-07-26 ENCOUNTER — Encounter (HOSPITAL_COMMUNITY)
Admission: RE | Disposition: A | Discharge status: Home / Self Care | Payer: Self-pay | Source: Home / Self Care | Attending: Cardiovascular Disease

## 2022-07-26 DIAGNOSIS — I4821 Permanent atrial fibrillation: Secondary | ICD-10-CM | POA: Diagnosis not present

## 2022-07-26 DIAGNOSIS — I441 Atrioventricular block, second degree: Secondary | ICD-10-CM | POA: Diagnosis not present

## 2022-07-26 DIAGNOSIS — Z950 Presence of cardiac pacemaker: Secondary | ICD-10-CM

## 2022-07-26 DIAGNOSIS — R531 Weakness: Secondary | ICD-10-CM | POA: Diagnosis not present

## 2022-07-26 DIAGNOSIS — Z45010 Encounter for checking and testing of cardiac pacemaker pulse generator [battery]: Secondary | ICD-10-CM | POA: Diagnosis not present

## 2022-07-26 DIAGNOSIS — Z7401 Bed confinement status: Secondary | ICD-10-CM | POA: Diagnosis not present

## 2022-07-26 DIAGNOSIS — Z01818 Encounter for other preprocedural examination: Secondary | ICD-10-CM

## 2022-07-26 HISTORY — PX: PPM GENERATOR CHANGEOUT: EP1233

## 2022-07-26 LAB — BASIC METABOLIC PANEL
Anion gap: 11 (ref 5–15)
BUN: 6 mg/dL — ABNORMAL LOW (ref 8–23)
CO2: 26 mmol/L (ref 22–32)
Calcium: 8.2 mg/dL — ABNORMAL LOW (ref 8.9–10.3)
Chloride: 98 mmol/L (ref 98–111)
Creatinine, Ser: 0.94 mg/dL (ref 0.44–1.00)
GFR, Estimated: >60 mL/min (ref >60)
Glucose, Bld: 89 mg/dL (ref 70–99)
Potassium: 3.2 mmol/L — ABNORMAL LOW (ref 3.5–5.1)
Sodium: 135 mmol/L (ref 135–145)

## 2022-07-26 LAB — CBC
HCT: 42.8 % (ref 36.0–46.0)
Hemoglobin: 13.8 g/dL (ref 12.0–15.0)
MCH: 30.4 pg (ref 26.0–34.0)
MCHC: 32.2 g/dL (ref 30.0–36.0)
MCV: 94.3 fL (ref 80.0–100.0)
Platelets: 274 10*3/uL (ref 150–400)
RBC: 4.54 MIL/uL (ref 3.87–5.11)
RDW: 15.8 % — ABNORMAL HIGH (ref 11.5–15.5)
WBC: 8.1 10*3/uL (ref 4.0–10.5)
nRBC: 0 % (ref 0.0–0.2)

## 2022-07-26 SURGERY — PPM GENERATOR CHANGEOUT

## 2022-07-26 MED ORDER — SODIUM CHLORIDE 0.9 % IV SOLN: INTRAVENOUS | Status: DC

## 2022-07-26 MED ORDER — ACETAMINOPHEN 325 MG PO TABS: 325.0000 mg | ORAL_TABLET | ORAL | Status: DC | PRN

## 2022-07-26 MED ORDER — CEFAZOLIN SODIUM-DEXTROSE 2-4 GM/100ML-% IV SOLN
2.0000 g | INTRAVENOUS | Status: AC
  Administered 2022-07-26: 2 g via INTRAVENOUS

## 2022-07-26 MED ORDER — LIDOCAINE HCL (PF) 1 % IJ SOLN
INTRAMUSCULAR | Status: AC
  Filled 2022-07-26: qty 30

## 2022-07-26 MED ORDER — MIDAZOLAM HCL 5 MG/5ML IJ SOLN
INTRAMUSCULAR | Status: DC | PRN
  Administered 2022-07-26: 1 mg via INTRAVENOUS

## 2022-07-26 MED ORDER — SODIUM CHLORIDE 0.9 % IV SOLN
80.0000 mg | INTRAVENOUS | Status: AC
  Administered 2022-07-26: 80 mg

## 2022-07-26 MED ORDER — CEFAZOLIN SODIUM-DEXTROSE 2-4 GM/100ML-% IV SOLN
INTRAVENOUS | Status: AC
  Filled 2022-07-26: qty 100

## 2022-07-26 MED ORDER — POVIDONE-IODINE 10 % EX SWAB
2.0000 | Freq: Once | CUTANEOUS | Status: AC
  Administered 2022-07-26: 2 via TOPICAL

## 2022-07-26 MED ORDER — MIDAZOLAM HCL 5 MG/5ML IJ SOLN
INTRAMUSCULAR | Status: AC
  Filled 2022-07-26: qty 5

## 2022-07-26 MED ORDER — ONDANSETRON HCL 4 MG/2ML IJ SOLN: 4.0000 mg | Freq: Four times a day (QID) | INTRAMUSCULAR | Status: DC | PRN

## 2022-07-26 MED ORDER — FENTANYL CITRATE (PF) 100 MCG/2ML IJ SOLN
INTRAMUSCULAR | Status: AC
  Filled 2022-07-26: qty 2

## 2022-07-26 MED ORDER — SODIUM CHLORIDE 0.9 % IV SOLN
INTRAVENOUS | Status: AC
  Filled 2022-07-26: qty 2

## 2022-07-26 MED ORDER — FENTANYL CITRATE (PF) 100 MCG/2ML IJ SOLN
INTRAMUSCULAR | Status: DC | PRN
  Administered 2022-07-26: 25 ug via INTRAVENOUS

## 2022-07-26 MED ORDER — LIDOCAINE HCL 1 % IJ SOLN
INTRAMUSCULAR | Status: AC
  Filled 2022-07-26: qty 40

## 2022-07-26 MED ORDER — CHLORHEXIDINE GLUCONATE 4 % EX LIQD
4.0000 | Freq: Once | CUTANEOUS | Status: DC
  Filled 2022-07-26: qty 60

## 2022-07-26 MED ORDER — LIDOCAINE HCL (PF) 1 % IJ SOLN
INTRAMUSCULAR | Status: DC | PRN
  Administered 2022-07-26: 50 mL

## 2022-07-26 SURGICAL SUPPLY — 7 items
CABLE SURGICAL S-101-97-12 (CABLE) ×1 IMPLANT
MAT PREVALON FULL STRYKER (MISCELLANEOUS) IMPLANT
PACEMAKER ASSURITY DR-RF (Pacemaker) IMPLANT
PAD DEFIB RADIO PHYSIO CONN (PAD) ×1 IMPLANT
POUCH AIGIS-R ANTIBACT PPM (Mesh General) ×1 IMPLANT
POUCH AIGIS-R ANTIBACT PPM MED (Mesh General) IMPLANT
TRAY PACEMAKER INSERTION (PACKS) ×1 IMPLANT

## 2022-07-26 NOTE — Progress Notes (Signed)
Discharge instructions was given to patient son. He verbalized understanding.

## 2022-07-26 NOTE — H&P (Signed)
PCP: Junie Spencer, FNP Primary Cardiologist: Dr Diona Browner Primary EP:  Dr Nelly Laurence  Megan [name] is a 77 y.o. female who presents today for routine electrophysiology followup.  She has not done well recently.  She has been in rehab.  She had a mechanical fall and developed SDH for which she was hospitalized.  Her coumadin was discontinued.  She remains weak.    Today, she denies symptoms of palpitations, chest pain, shortness of breath,  lower extremity edema, dizziness, presyncope, or syncope.  She has a bruise on her leg. The patient is otherwise without complaint today.   She presents today for pacemaker generator change.  Past Medical History:  Diagnosis Date   Acute diastolic heart failure (HCC)    Anxiety state, unspecified    Asthma    Atrial fibrillation (HCC)    Congestive heart failure, unspecified    CVA (cerebral vascular accident) (HCC)    Degenerative joint disease    GERD (gastroesophageal reflux disease)    Hyperlipidemia    Hypertension    Lymphedema    Obesity    OSA (obstructive sleep apnea) 06/09/2015   Persistent atrial fibrillation (HCC)     post-termination pauses with afib s/p PPM   Presence of permanent cardiac pacemaker    Sinoatrial node dysfunction (HCC)    s/p PPM   Sleep apnea    compliant with CPAP   Unspecified hypothyroidism    Unspecified venous (peripheral) insufficiency    Past Surgical History:  Procedure Laterality Date   ABDOMINAL AORTOGRAM W/LOWER EXTREMITY N/A 06/02/2022   Procedure: ABDOMINAL AORTOGRAM W/LOWER EXTREMITY;  Surgeon: Victorino Sparrow, MD;  [facility]VASIVE CV LAB;  Service: Cardiovascular;  Laterality: N/A;   CATARACT EXTRACTION W/PHACO Left 09/09/2014   Procedure: CATARACT EXTRACTION PHACO AND INTRAOCULAR LENS PLACEMENT (IOC);  Surgeon: Gemma Payor, MD;  Location: [facility];  Service: Ophthalmology;  Laterality: Left;  CDE: 6.99   CATARACT EXTRACTION W/PHACO Right 10/07/2014   Procedure: CATARACT EXTRACTION  PHACO AND INTRAOCULAR LENS PLACEMENT RIGHT EYE;  Surgeon: Gemma Payor, MD;  Location: [facility];  Service: Ophthalmology;  Laterality: Right;  CDE:5.16   PACEMAKER INSERTION     SJM by JA   PERIPHERAL VASCULAR THROMBECTOMY  06/02/2022   Procedure: PERIPHERAL VASCULAR THROMBECTOMY;  Surgeon: Victorino Sparrow, MD;  Location: [facility] INVASIVE CV LAB;  Service: Cardiovascular;;    ROS- all systems are reviewed and negative except as per HPI above  Current Facility-Administered Medications  Medication Dose Route Frequency Provider Last Rate Last Admin   0.9 %  sodium chloride infusion   Intravenous Continuous Laurin Paulo, Roberts Gaudy, MD 50 mL/hr at 07/26/22 1115 New Bag at 07/26/22 1115   ceFAZolin (ANCEF) IVPB 2g/100 mL premix  2 g Intravenous On Call Muhammadali Ries, Roberts Gaudy, MD       chlorhexidine (HIBICLENS) 4 % liquid 4 Application  4 Application Topical Once Zilpha Mcandrew, Roberts Gaudy, MD       gentamicin (GARAMYCIN) 80 mg in sodium chloride 0.9 % 500 mL irrigation  80 mg Irrigation On Call Tevion Laforge, Roberts Gaudy, MD        Physical Exam: Vitals:   07/26/22 1109  BP: (!) 133/50  Pulse: 86  Resp: 18  Temp: 98.6 F (37 C)  TempSrc: Oral  SpO2: 96%  Weight: 107 kg  Height: 5' (1.524 m)    GEN- The patient is obese, chronically ill appearing, alert and oriented x 3 today.   Head- normocephalic, atraumatic Eyes-  Sclera  clear, conjunctiva pink Ears- hearing intact Oropharynx- clear Lungs-  normal work of breathing Chest- pacemaker pocket is well healed Heart- iRRR GI- soft, NT, ND, + BS Extremities- no clubbing, cyanosis, or edema    Assessment and Plan:  1. Symptomatic second degree heart block Pacemaker is at ERI. She presents today for generator change. Risks, benefits, and alternatives to PPM pulse generator replacement were discussed in detail today.  The patient understands that risks include but are not limited to bleeding, infection, pneumothorax, perforation, tamponade, vascular damage, renal  failure, MI, stroke, death,   damage to his existing leads, and lead dislodgement and wishes to proceed once she reaches ERI. Remote monitoring equipment requested today.  2. Permanent afib Rate controlled Coumadin discontinued due to fall and SDH.  Will need to start when approved by neurology    Maurice Small, MD  07/26/2022 11:36 AM

## 2022-07-26 NOTE — Progress Notes (Signed)
Called [facility] for update. Estimated pick up time 30 - 45 minutes.

## 2022-07-26 NOTE — Progress Notes (Signed)
[facility] called for update. Patient is currently 2nd in line to be picked up. No estimated time given for pick up. Patient is very upset with the wait time.

## 2022-07-26 NOTE — Discharge Instructions (Signed)

## 2022-07-27 ENCOUNTER — Encounter (HOSPITAL_COMMUNITY): Payer: Self-pay | Admitting: Cardiovascular Disease

## 2022-07-27 DIAGNOSIS — J449 Chronic obstructive pulmonary disease, unspecified: Secondary | ICD-10-CM | POA: Diagnosis not present

## 2022-07-27 DIAGNOSIS — I739 Peripheral vascular disease, unspecified: Secondary | ICD-10-CM | POA: Diagnosis not present

## 2022-07-27 DIAGNOSIS — I679 Cerebrovascular disease, unspecified: Secondary | ICD-10-CM | POA: Diagnosis not present

## 2022-07-27 DIAGNOSIS — D72829 Elevated white blood cell count, unspecified: Secondary | ICD-10-CM | POA: Diagnosis not present

## 2022-07-27 DIAGNOSIS — I110 Hypertensive heart disease with heart failure: Secondary | ICD-10-CM | POA: Diagnosis not present

## 2022-07-27 DIAGNOSIS — I5032 Chronic diastolic (congestive) heart failure: Secondary | ICD-10-CM | POA: Diagnosis not present

## 2022-07-28 ENCOUNTER — Telehealth: Payer: Self-pay | Admitting: Family

## 2022-07-28 DIAGNOSIS — I110 Hypertensive heart disease with heart failure: Secondary | ICD-10-CM | POA: Diagnosis not present

## 2022-07-28 DIAGNOSIS — I739 Peripheral vascular disease, unspecified: Secondary | ICD-10-CM | POA: Diagnosis not present

## 2022-07-28 DIAGNOSIS — J449 Chronic obstructive pulmonary disease, unspecified: Secondary | ICD-10-CM | POA: Diagnosis not present

## 2022-07-28 DIAGNOSIS — I5032 Chronic diastolic (congestive) heart failure: Secondary | ICD-10-CM | POA: Diagnosis not present

## 2022-07-28 DIAGNOSIS — I679 Cerebrovascular disease, unspecified: Secondary | ICD-10-CM | POA: Diagnosis not present

## 2022-07-28 DIAGNOSIS — D72829 Elevated white blood cell count, unspecified: Secondary | ICD-10-CM | POA: Diagnosis not present

## 2022-07-28 NOTE — Telephone Encounter (Signed)
Lmtcb.

## 2022-07-28 NOTE — Telephone Encounter (Signed)
I do not know what she means by breathing machine, if she means oxygen or CPAP, then yes that is fine to use both

## 2022-07-29 ENCOUNTER — Ambulatory Visit: Payer: Medicare Other

## 2022-07-29 DIAGNOSIS — D72829 Elevated white blood cell count, unspecified: Secondary | ICD-10-CM | POA: Diagnosis not present

## 2022-07-29 DIAGNOSIS — J449 Chronic obstructive pulmonary disease, unspecified: Secondary | ICD-10-CM | POA: Diagnosis not present

## 2022-07-29 DIAGNOSIS — I739 Peripheral vascular disease, unspecified: Secondary | ICD-10-CM | POA: Diagnosis not present

## 2022-07-29 DIAGNOSIS — I5032 Chronic diastolic (congestive) heart failure: Secondary | ICD-10-CM | POA: Diagnosis not present

## 2022-07-29 DIAGNOSIS — I679 Cerebrovascular disease, unspecified: Secondary | ICD-10-CM | POA: Diagnosis not present

## 2022-07-29 DIAGNOSIS — I110 Hypertensive heart disease with heart failure: Secondary | ICD-10-CM | POA: Diagnosis not present

## 2022-07-30 DIAGNOSIS — D72829 Elevated white blood cell count, unspecified: Secondary | ICD-10-CM | POA: Diagnosis not present

## 2022-07-30 DIAGNOSIS — I5032 Chronic diastolic (congestive) heart failure: Secondary | ICD-10-CM | POA: Diagnosis not present

## 2022-07-30 DIAGNOSIS — I110 Hypertensive heart disease with heart failure: Secondary | ICD-10-CM | POA: Diagnosis not present

## 2022-07-30 DIAGNOSIS — I679 Cerebrovascular disease, unspecified: Secondary | ICD-10-CM | POA: Diagnosis not present

## 2022-07-30 DIAGNOSIS — J449 Chronic obstructive pulmonary disease, unspecified: Secondary | ICD-10-CM | POA: Diagnosis not present

## 2022-07-30 DIAGNOSIS — I739 Peripheral vascular disease, unspecified: Secondary | ICD-10-CM | POA: Diagnosis not present

## 2022-07-30 NOTE — Telephone Encounter (Signed)
Reviewed Dr Oris Drone note with patient and patient voiced understanding.

## 2022-07-30 NOTE — Telephone Encounter (Signed)
Lmtcb again for patient to call back

## 2022-07-30 NOTE — Telephone Encounter (Signed)
Left message again this patient needs to be seen in person

## 2022-07-30 NOTE — Telephone Encounter (Signed)
Tried  to call patient again had to leave message

## 2022-07-31 DIAGNOSIS — D72829 Elevated white blood cell count, unspecified: Secondary | ICD-10-CM | POA: Diagnosis not present

## 2022-07-31 DIAGNOSIS — I679 Cerebrovascular disease, unspecified: Secondary | ICD-10-CM | POA: Diagnosis not present

## 2022-07-31 DIAGNOSIS — I739 Peripheral vascular disease, unspecified: Secondary | ICD-10-CM | POA: Diagnosis not present

## 2022-07-31 DIAGNOSIS — I5032 Chronic diastolic (congestive) heart failure: Secondary | ICD-10-CM | POA: Diagnosis not present

## 2022-07-31 DIAGNOSIS — J449 Chronic obstructive pulmonary disease, unspecified: Secondary | ICD-10-CM | POA: Diagnosis not present

## 2022-07-31 DIAGNOSIS — I110 Hypertensive heart disease with heart failure: Secondary | ICD-10-CM | POA: Diagnosis not present

## 2022-08-02 ENCOUNTER — Telehealth: Payer: Self-pay | Admitting: Family

## 2022-08-02 ENCOUNTER — Ambulatory Visit: Payer: Medicare Other | Admitting: *Deleted

## 2022-08-02 ENCOUNTER — Encounter: Payer: Self-pay | Admitting: *Deleted

## 2022-08-02 DIAGNOSIS — J449 Chronic obstructive pulmonary disease, unspecified: Secondary | ICD-10-CM | POA: Diagnosis not present

## 2022-08-02 DIAGNOSIS — D72829 Elevated white blood cell count, unspecified: Secondary | ICD-10-CM | POA: Diagnosis not present

## 2022-08-02 DIAGNOSIS — I110 Hypertensive heart disease with heart failure: Secondary | ICD-10-CM | POA: Diagnosis not present

## 2022-08-02 DIAGNOSIS — I5032 Chronic diastolic (congestive) heart failure: Secondary | ICD-10-CM | POA: Diagnosis not present

## 2022-08-02 DIAGNOSIS — I679 Cerebrovascular disease, unspecified: Secondary | ICD-10-CM | POA: Diagnosis not present

## 2022-08-02 DIAGNOSIS — I739 Peripheral vascular disease, unspecified: Secondary | ICD-10-CM | POA: Diagnosis not present

## 2022-08-02 NOTE — Patient Outreach (Signed)
Care Coordination   Follow Up Visit Note   08/02/2022  Name: Megan [name] MRN: [id number] DOB: 05-Oct-1945  Megan [name] is a 77 y.o. year old female who sees Lendon Colonel, Edilia Bo, FNP for primary care. I spoke with [name] by phone today.  What matters to the patients health and wellness today?  Receive Assistance Applying for Medicaid & Personal Care Services.   Goals Addressed               This Visit's Progress     Receive Assistance Applying for Medicaid & Personal Care Services. (pt-stated)   On track     Care Coordination Interventions:  Interventions Today    Flowsheet Row Most Recent Value  Chronic Disease   Chronic disease during today's visit Congestive Heart Failure (CHF), Hypertension (HTN), Other  [Morbid Obesity, Inability to Perform Activities of Daily Living Independently, Caregiver Stress, Needs Wheelchair Ramp Installed for Mobility & Safety & Generalized Weakness.]  General Interventions   General Interventions Discussed/Reviewed General Interventions Discussed, General Interventions Reviewed, Annual Eye Exam, Labs, Durable Medical Equipment (DME), [employer], Level of Care, Communication with, Health Screening, Doctor Visits, Vaccines  [Communication with Primary Care Provider]  Labs Kidney Function  [Encouraged]  Vaccines COVID-19, Flu, Pneumonia, RSV, Shingles, Tetanus/Pertussis/Diphtheria  [Encouraged]  Doctor Visits Discussed/Reviewed Doctor Visits Discussed, Specialist, Doctor Visits Reviewed, Annual Wellness Visits, PCP  [Encouraged]  Health Screening Bone Density, Colonoscopy, Mammogram  [Encouraged]  Durable Medical Equipment (DME) Wheelchair  Wheelchair Standard  PCP/Specialist Visits Compliance with follow-up visit  [Encouraged]  Communication with PCP/Specialists, RN  Level of Care Adult Daycare, Personal Care Services, Applications, Assisted Living, Skilled Nursing Facility  [Encouraged]  Applications Medicaid, Personal Care  Services  [Encouraged]  Exercise Interventions   Exercise Discussed/Reviewed Exercise Discussed, Assistive device use and maintanence, Exercise Reviewed, Physical Activity  [Encouraged]  Physical Activity Discussed/Reviewed Physical Activity Discussed, Home Exercise Program (HEP), Physical Activity Reviewed, Types of exercise  [Encouraged]  Education Interventions   Education Provided Provided Therapist, sports, Provided Web-based Education, Provided Education  Provided Verbal Education On Nutrition, Mental Health/Coping with Illness, When to see the doctor, [employer]green, [employer], Medication, Exercise, Applications, Eye Care  [Encouraged]  Applications Medicaid, Personal Care Services  [Encouraged]  Mental Health Interventions   Mental Health Discussed/Reviewed Mental Health Discussed, Anxiety, Depression, Grief and Loss, Mental Health Reviewed, Coping Strategies, Substance Abuse, Suicide, Other, Crisis  [Domestic Violence]  Nutrition Interventions   Nutrition Discussed/Reviewed Nutrition Discussed, Nutrition Reviewed, Carbohydrate meal planning, Decreasing sugar intake, Decreasing salt, Portion sizes, Decreasing fats, Fluid intake, Adding fruits and vegetables, Increaing proteins  [Encouraged]  Pharmacy Interventions   Pharmacy Dicussed/Reviewed Pharmacy Topics Discussed, Medication Adherence, Affording Medications, Pharmacy Topics Reviewed  [Encouraged]  Safety Interventions   Safety Discussed/Reviewed Safety Discussed, Safety Reviewed, Fall Risk, Home Safety  [Encouraged]  Home Safety Assistive Devices, Need for home safety assessment, Refer for community resources  [Encouraged]  Advanced Directive Interventions   Advanced Directives Discussed/Reviewed Advanced Directives Discussed  [Completion Encouraged]     Active Listening & Reflection Utilized.  Verbalization of Feelings Encouraged.  Emotional Support Provided. Feelings of Loss of Independence Validated. Caregiver  Stress Acknowledged. Caregiver Resources Reviewed. Caregiver Support Groups Provided. Self-Enrollment in Caregiver Support Group of Interest Emphasized. Problem Solving Interventions Indicated. Task-Centered Solutions Employed.   Solution-Focused Strategies Activated. Acceptance & Commitment Therapy Implemented. Cognitive Behavioral Therapy Initiated. Client-Centered Therapy Performed. Confirmed Home Health Skilled Nursing, Physical Therapy, Occupational Therapy & Social Work Services in [place], through [employer] [phone]). CSW Collaboration with Verlon Au, [facility] Social Worker with [facility] [phone]), to Confirm Her Vested Interest in Assisting You with Obtaining a Hospital Bed & Lift Chair for Home Use. CSW Collaboration with Representative from [employer] [facility] (# [phone]), to Inquire About [employer], Supplies, Pricing, Availability, Etc.  Encouraged Careers information officer with Representative from [employer] [facility] (# [phone]), to Place Name on Waiting List to Obtain a Hospital Bed & Lift Chair for Home Use. Confirmed Continued Private Pay Caregiver Services in [place], [place], [id number] & Friday's, 5 Hour Per Day. Confirmed Receipt & Thoroughly Reviewed the Following List of [employer], Nurse, adult, Wellsite geologist in [place], Geographical information systems officer & In-Home Care Services: ~ 2023 Medicaid Tips ~ [employer] Application  ~ Making Medicaid Accessible: The [employer] Assistance     for Patients ~ Apply for [employer] On-Line ~ Merchant navy officer ~ Personal Care Services Providers Confirmed Disinterest in Applying for [employer], through The [employer] [facility] [phone]). Confirmed Disinterest in Applying for Personal Care Services, through [facility] [phone]). Confirmed Receipt & Thoroughly Reviewed the Following List of  [employer], Services & Resources in [place], Offering Reduced Network engineer: ~ [employer] -     # [phone] ~ Teachers Insurance and Annuity Association for Disabled Adults, through Aging,     Disability & Transit Services of [place] - # [phone] ~ [facility] Men's Association - # [phone] Ext. [phone] ~ [employer] Accessibility - # 9126574981 ~ [facility] - # [phone] Encouraged Direct Contact with [employer], Wellsite geologist of Interest in [place], Nurse, learning disability, in An Effort to [employer] & Secure Mobility. Encouraged Review of The Following List of [employer], [employer] & Resources in [place], Offering [employer], Mailed on 08/02/2022:          ~ In-Home Care & Respite Agencies          ~ Home Health Care Agencies          ~ Respite Care Agencies & Facilities Encouraged Careers information officer with [employer], Services & Resources of Interest in [place], Offering [employer], in An Effort to Obtain 24 Hour Care & Supervision in The Home. Encouraged Careers information officer with CSW (# [phone]), if You Have Questions, Need Assistance, or If Additional Social Work Needs Are Identified Between Now & Our Next Scheduled Follow-Up Outreach Call.      SDOH assessments and interventions completed:  Yes.  Care Coordination Interventions:  Yes, provided.   Follow up plan: Follow up call scheduled for 08/05/2022 at 11:45 am.  Encounter Outcome:  Pt. Visit Completed.   Danford Bad, BSW, MSW, LCSW  Licensed Restaurant manager, fast food [facility]  Mailing [place] [address] [place], [place] [address] Physical Address-[address] [place], [place] [address] Toll Free Main # [phone] Fax # [phone] Cell #  [phone] [email or web address]

## 2022-08-02 NOTE — Telephone Encounter (Signed)
Pt called requesting to speak with Mardene Celeste, her care coordinator.

## 2022-08-02 NOTE — Patient Instructions (Signed)
Visit Information  Thank you for taking time to visit with me today. Please don't hesitate to contact me if I can be of assistance to you.   Following are the goals we discussed today:   Goals Addressed               This Visit's Progress     Receive Assistance Applying for Medicaid & Personal Care Services. (pt-stated)   On track     Care Coordination Interventions:  Interventions Today    Flowsheet Row Most Recent Value  Chronic Disease   Chronic disease during today's visit Congestive Heart Failure (CHF), Hypertension (HTN), Other  [Morbid Obesity, Inability to Perform Activities of Daily Living Independently, Caregiver Stress, Needs Wheelchair Ramp Installed for Mobility & Safety & Generalized Weakness.]  General Interventions   General Interventions Discussed/Reviewed General Interventions Discussed, General Interventions Reviewed, Annual Eye Exam, Labs, Durable Medical Equipment (DME), Walgreen, Level of Care, Communication with, Health Screening, Doctor Visits, Vaccines  [Communication with Primary Care Provider]  Labs Kidney Function  [Encouraged]  Vaccines COVID-19, Flu, Pneumonia, RSV, Shingles, Tetanus/Pertussis/Diphtheria  [Encouraged]  Doctor Visits Discussed/Reviewed Doctor Visits Discussed, Specialist, Doctor Visits Reviewed, Annual Wellness Visits, PCP  [Encouraged]  Health Screening Bone Density, Colonoscopy, Mammogram  [Encouraged]  Durable Medical Equipment (DME) Wheelchair  Wheelchair Standard  PCP/Specialist Visits Compliance with follow-up visit  [Encouraged]  Communication with PCP/Specialists, RN  Level of Care Adult Daycare, Personal Care Services, Applications, Assisted Living, Skilled Nursing Facility  [Encouraged]  Applications Medicaid, Personal Care Services  [Encouraged]  Exercise Interventions   Exercise Discussed/Reviewed Exercise Discussed, Assistive device use and maintanence, Exercise Reviewed, Physical Activity  [Encouraged]  Physical  Activity Discussed/Reviewed Physical Activity Discussed, Home Exercise Program (HEP), Physical Activity Reviewed, Types of exercise  [Encouraged]  Education Interventions   Education Provided Provided Therapist, sports, Provided Web-based Education, Provided Education  Provided Verbal Education On Nutrition, Mental Health/Coping with Illness, When to see the doctor, Walgreen, [employer], Medication, Exercise, Applications, Eye Care  [Encouraged]  Applications Medicaid, Personal Care Services  [Encouraged]  Mental Health Interventions   Mental Health Discussed/Reviewed Mental Health Discussed, Anxiety, Depression, Grief and Loss, Mental Health Reviewed, Coping Strategies, Substance Abuse, Suicide, Other, Crisis  [Domestic Violence]  Nutrition Interventions   Nutrition Discussed/Reviewed Nutrition Discussed, Nutrition Reviewed, Carbohydrate meal planning, Decreasing sugar intake, Decreasing salt, Portion sizes, Decreasing fats, Fluid intake, Adding fruits and vegetables, Increaing proteins  [Encouraged]  Pharmacy Interventions   Pharmacy Dicussed/Reviewed Pharmacy Topics Discussed, Medication Adherence, Affording Medications, Pharmacy Topics Reviewed  [Encouraged]  Safety Interventions   Safety Discussed/Reviewed Safety Discussed, Safety Reviewed, Fall Risk, Home Safety  [Encouraged]  Home Safety Assistive Devices, Need for home safety assessment, Refer for community resources  [Encouraged]  Advanced Directive Interventions   Advanced Directives Discussed/Reviewed Advanced Directives Discussed  [Completion Encouraged]     Active Listening & Reflection Utilized.  Verbalization of Feelings Encouraged.  Emotional Support Provided. Feelings of Loss of Independence Validated. Caregiver Stress Acknowledged. Caregiver Resources Reviewed. Caregiver Support Groups Provided. Self-Enrollment in Caregiver Support Group of Interest Emphasized. Problem Solving Interventions  Indicated. Task-Centered Solutions Employed.   Solution-Focused Strategies Activated. Acceptance & Commitment Therapy Implemented. Cognitive Behavioral Therapy Initiated. Client-Centered Therapy Performed. Confirmed Home Health Skilled Nursing, Physical Therapy, Occupational Therapy & Social Work Services in [place], through [facility] [phone]). CSW Collaboration with Verlon Au, [facility] Social Worker with [facility] [phone]), to Confirm Her Vested Interest in Assisting You with Obtaining a Hospital Bed & Lift  Chair for Home Use. CSW Collaboration with Representative from [employer] [facility] (# [phone]), to Inquire About [employer], Supplies, Pricing, Availability, Etc.  Encouraged Careers information officer with Representative from [employer] [facility] (# [phone]), to Place Name on Waiting List to Obtain a Hospital Bed & Lift Chair for Home Use. Confirmed Continued Private Pay Caregiver Services in [place], [place], [id number] & Friday's, 5 Hour Per Day. Confirmed Receipt & Thoroughly Reviewed the Following List of [employer], Nurse, adult, Wellsite geologist in [place], Geographical information systems officer & In-Home Care Services: ~ 2023 Medicaid Tips ~ [employer] Application  ~ Making Medicaid Accessible: The [employer] Assistance     for Patients ~ Apply for [employer] On-Line ~ Merchant navy officer ~ Personal Care Services Providers Confirmed Disinterest in Applying for [employer], through The [employer] of [facility] [phone]). Confirmed Disinterest in Applying for Personal Care Services, through [facility] [phone]). Confirmed Receipt & Thoroughly Reviewed the Following List of [employer], Services & Resources in [place], Offering Reduced Network engineer: ~ [employer] -     #  [phone] ~ Teachers Insurance and Annuity Association for Disabled Adults, through Aging,     Disability & Transit Services of [place] - # [phone] ~ [facility] Men's Association - # [phone] Ext. [phone] ~ [employer] Accessibility - # [phone] ~ [facility] - # 7265873174 Encouraged Direct Contact with [employer], Wellsite geologist of Interest in [place], Nurse, learning disability, in An Effort to [employer] & Secure Mobility. Encouraged Review of The Following List of [employer]uss, [employer] & Resources in [place], Offering [employer], Mailed on 08/02/2022:          ~ In-Home Care & Respite Agencies          ~ Home Health Care Agencies          ~ Respite Care Agencies & Facilities Encouraged Careers information officer with [employer], Services & Resources of Interest in [place], Offering [employer], in An Effort to Obtain 24 Hour Care & Supervision in The Home. Encouraged Careers information officer with CSW (# [phone]), if You Have Questions, Need Assistance, or If Additional Social Work Needs Are Identified Between Now & Our Next Scheduled Follow-Up Outreach Call.      Our next appointment is by telephone on 08/05/2022 at 11:45 am.  Please call the care guide team at [phone] if you need to cancel or reschedule your appointment.   If you are experiencing a Mental Health or Behavioral Health Crisis or need someone to talk to, please call the Suicide and Crisis Lifeline: 988 call the [place] National Suicide Prevention Lifeline: 865-331-1427 or TTY: 205-528-9966 TTY 951-778-7550) to talk to a trained counselor call 1-800-273-TALK (toll free, 24 hour hotline) go to [facility] [address], [place] [phone]) call the [facility] Crisis Line: [phone] call 911  Patient verbalizes understanding of instructions and care plan provided today and agrees  to view in MyChart. Active MyChart status and patient understanding of how to access instructions and care plan via MyChart confirmed with patient.     Telephone follow up appointment with care management team member scheduled for:  08/05/2022 at 11:45 am.  Danford Bad, BSW, MSW, LCSW  Licensed Clinical Social Worker  [employer] treasurer [facility]  Mailing [place]. [address] [place], [place] [address] Physical Address-[address],  [place], [place] [address] [place] # [phone] Fax # [phone] Cell # [phone] [email or web address]

## 2022-08-04 DIAGNOSIS — I5032 Chronic diastolic (congestive) heart failure: Secondary | ICD-10-CM | POA: Diagnosis not present

## 2022-08-04 DIAGNOSIS — J449 Chronic obstructive pulmonary disease, unspecified: Secondary | ICD-10-CM | POA: Diagnosis not present

## 2022-08-04 DIAGNOSIS — I679 Cerebrovascular disease, unspecified: Secondary | ICD-10-CM | POA: Diagnosis not present

## 2022-08-04 DIAGNOSIS — D72829 Elevated white blood cell count, unspecified: Secondary | ICD-10-CM | POA: Diagnosis not present

## 2022-08-04 DIAGNOSIS — I739 Peripheral vascular disease, unspecified: Secondary | ICD-10-CM | POA: Diagnosis not present

## 2022-08-04 DIAGNOSIS — I110 Hypertensive heart disease with heart failure: Secondary | ICD-10-CM | POA: Diagnosis not present

## 2022-08-05 ENCOUNTER — Ambulatory Visit: Payer: Self-pay | Admitting: *Deleted

## 2022-08-05 ENCOUNTER — Ambulatory Visit: Payer: No Typology Code available for payment source

## 2022-08-05 ENCOUNTER — Encounter: Payer: Self-pay | Admitting: *Deleted

## 2022-08-05 DIAGNOSIS — I5032 Chronic diastolic (congestive) heart failure: Secondary | ICD-10-CM | POA: Diagnosis not present

## 2022-08-05 DIAGNOSIS — J449 Chronic obstructive pulmonary disease, unspecified: Secondary | ICD-10-CM | POA: Diagnosis not present

## 2022-08-05 DIAGNOSIS — D72829 Elevated white blood cell count, unspecified: Secondary | ICD-10-CM | POA: Diagnosis not present

## 2022-08-05 DIAGNOSIS — I739 Peripheral vascular disease, unspecified: Secondary | ICD-10-CM | POA: Diagnosis not present

## 2022-08-05 DIAGNOSIS — I679 Cerebrovascular disease, unspecified: Secondary | ICD-10-CM | POA: Diagnosis not present

## 2022-08-05 DIAGNOSIS — I110 Hypertensive heart disease with heart failure: Secondary | ICD-10-CM | POA: Diagnosis not present

## 2022-08-05 NOTE — Patient Outreach (Signed)
Care Coordination   Follow Up Visit Note   08/05/2022  Name: Megan [name] MRN: [id number] DOB: 1945-10-31  Megan [name] is a 77 y.o. year old female who sees Lendon Colonel, Edilia Bo, FNP for primary care. I spoke with [name] by phone today.  What matters to the patients health and wellness today?  Receive Assistance Applying for Medicaid & Personal Care Services.   Goals Addressed               This Visit's Progress     Receive Assistance Applying for Medicaid & Personal Care Services. (pt-stated)   On track     Care Coordination Interventions:  Interventions Today    Flowsheet Row Most Recent Value  Chronic Disease   Chronic disease during today's visit Congestive Heart Failure (CHF), Hypertension (HTN), Other  [Morbid Obesity, Inability to Perform Activities of Daily Living Independently, Caregiver Stress, Needs Wheelchair Ramp Installed for Mobility & Safety & Generalized Weakness.]  General Interventions   General Interventions Discussed/Reviewed General Interventions Discussed, General Interventions Reviewed, Annual Eye Exam, Labs, Durable Medical Equipment (DME), Walgreen, Level of Care, Communication with, Health Screening, Doctor Visits, Vaccines  [Communication with Primary Care Provider]  Labs Kidney Function  [Encouraged]  Vaccines COVID-19, Flu, Pneumonia, RSV, Shingles, Tetanus/Pertussis/Diphtheria  [Encouraged]  Doctor Visits Discussed/Reviewed Doctor Visits Discussed, Specialist, Doctor Visits Reviewed, Annual Wellness Visits, PCP  [Encouraged]  Health Screening Bone Density, Colonoscopy, Mammogram  [Encouraged]  Durable Medical Equipment (DME) Wheelchair  Wheelchair Standard  PCP/Specialist Visits Compliance with follow-up visit  [Encouraged]  Communication with PCP/Specialists, RN  Level of Care Adult Daycare, Personal Care Services, Applications, Assisted Living, Skilled Nursing Facility  [Encouraged]  Applications Medicaid, Personal Care  Services  [Encouraged]  Exercise Interventions   Exercise Discussed/Reviewed Exercise Discussed, Assistive device use and maintanence, Exercise Reviewed, Physical Activity  [Encouraged]  Physical Activity Discussed/Reviewed Physical Activity Discussed, Home Exercise Program (HEP), Physical Activity Reviewed, Types of exercise  [Encouraged]  Education Interventions   Education Provided Provided Therapist, sports, Provided Web-based Education, Provided Education  Provided Verbal Education On Nutrition, Mental Health/Coping with Illness, When to see the doctor, Walgreen, [employer], Medication, Exercise, Applications, Eye Care  [Encouraged]  Applications Medicaid, Personal Care Services  [Encouraged]  Mental Health Interventions   Mental Health Discussed/Reviewed Mental Health Discussed, Anxiety, Depression, Grief and Loss, Mental Health Reviewed, Coping Strategies, Substance Abuse, Suicide, Other, Crisis  [Domestic Violence]  Nutrition Interventions   Nutrition Discussed/Reviewed Nutrition Discussed, Nutrition Reviewed, Carbohydrate meal planning, Decreasing sugar intake, Decreasing salt, Portion sizes, Decreasing fats, Fluid intake, Adding fruits and vegetables, Increaing proteins  [Encouraged]  Pharmacy Interventions   Pharmacy Dicussed/Reviewed Pharmacy Topics Discussed, Medication Adherence, Affording Medications, Pharmacy Topics Reviewed  [Encouraged]  Safety Interventions   Safety Discussed/Reviewed Safety Discussed, Safety Reviewed, Fall Risk, Home Safety  [Encouraged]  Home Safety Assistive Devices, Need for home safety assessment, Refer for community resources  [Encouraged]  Advanced Directive Interventions   Advanced Directives Discussed/Reviewed Advanced Directives Discussed  [Completion Encouraged]     Active Listening & Reflection Utilized.  Verbalization of Feelings Encouraged.  Emotional Support Provided. Feelings of Loss of Independence Validated. Caregiver  Stress Acknowledged. Caregiver Resources Reviewed. Caregiver Support Groups Provided. Self-Enrollment in Caregiver Support Group of Interest Emphasized. Problem Solving Interventions Indicated. Task-Centered Solutions Employed.   Solution-Focused Strategies Activated. Acceptance & Commitment Therapy Implemented. Cognitive Behavioral Therapy Initiated. Client-Centered Therapy Performed. Continue to Receive Home Health Skilled Nursing, Physical Therapy, Occupational Therapy & Social Work Services, through [employer]  [facility] [phone]). Continue to Work with Verlon Au, [facility] Social Worker with [facility] [phone]), to Receive Assistance Obtaining a Hospital Bed & Lift Chair for Home Use. Periodically Contact Representative with [employer] [facility] (# [phone]), to Check Status of Name on Waiting List to Receive a Hospital Bed & Lift Chair for Home Use, at Much Reduced Cost. Continue to Receive Private Pay Caregiver Services, 5 Hours Per Day (9:00 AM - 2:00 PM), 3 Days Per Week [facility], Wednesday's & Friday's), to Assist with Activities of Daily Living. Encouraged Careers information officer with [employer], Wellsite geologist of Interest in [place], Nurse, learning disability, in An Effort to [employer] & Secure Mobility, from List Provided: ~ [employer] -     # [phone] ~ [employer] Program for Disabled Adults, through Aging,     Disability & Transit Services of [place] - # [phone] ~ [facility] Men's Association - # [phone] Ext. [phone] ~ [employer] Accessibility - # [phone] ~ [facility] - # [phone] Encouraged Direct Contact with [employer], Wellsite geologist of Interest in [place], Offering [employer], in An Effort to Obtain 24 Hour Care & Supervision in The Home, from List Provided: ~ In-Home Care & Respite  Agencies          ~ Home Health Care Agencies          ~ Respite Care Agencies & Facilities Encouraged Careers information officer with CSW [phone]), if You Have Questions, Need Assistance, or If Additional Social Work Needs Are Identified Between Now & Our Next Scheduled Follow-Up Outreach Call.      SDOH assessments and interventions completed:  Yes.  Care Coordination Interventions:  Yes, provided.   Follow up plan: Follow up call scheduled for 08/19/2022 at 1:30 pm.   Encounter Outcome:  Pt. Visit Completed.   Danford Bad, BSW, MSW, LCSW  Licensed Restaurant manager, fast food [facility]  Mailing [place] [address] [place], [place] [address] Physical Address-[address], [place], [place] [address] Toll Free Main # [phone] Fax # [phone] Cell # [phone] [email or web address]

## 2022-08-05 NOTE — Patient Instructions (Signed)
Visit Information  Thank you for taking time to visit with me today. Please don't hesitate to contact me if I can be of assistance to you.   Following are the goals we discussed today:   Goals Addressed               This Visit's Progress     Receive Assistance Applying for Medicaid & Personal Care Services. (pt-stated)   On track     Care Coordination Interventions:  Interventions Today    Flowsheet Row Most Recent Value  Chronic Disease   Chronic disease during today's visit Congestive Heart Failure (CHF), Hypertension (HTN), Other  [Morbid Obesity, Inability to Perform Activities of Daily Living Independently, Caregiver Stress, Needs Wheelchair Ramp Installed for Mobility & Safety & Generalized Weakness.]  General Interventions   General Interventions Discussed/Reviewed General Interventions Discussed, General Interventions Reviewed, Annual Eye Exam, Labs, Durable Medical Equipment (DME), [employer], Level of Care, Communication with, Health Screening, Doctor Visits, Vaccines  [Communication with Primary Care Provider]  Labs Kidney Function  [Encouraged]  Vaccines COVID-19, Flu, Pneumonia, RSV, Shingles, Tetanus/Pertussis/Diphtheria  [Encouraged]  Doctor Visits Discussed/Reviewed Doctor Visits Discussed, Specialist, Doctor Visits Reviewed, Annual Wellness Visits, PCP  [Encouraged]  Health Screening Bone Density, Colonoscopy, Mammogram  [Encouraged]  Durable Medical Equipment (DME) Wheelchair  Wheelchair Standard  PCP/Specialist Visits Compliance with follow-up visit  [Encouraged]  Communication with PCP/Specialists, RN  Level of Care Adult Daycare, Personal Care Services, Applications, Assisted Living, Skilled Nursing Facility  [Encouraged]  Applications Medicaid, Personal Care Services  [Encouraged]  Exercise Interventions   Exercise Discussed/Reviewed Exercise Discussed, Assistive device use and maintanence, Exercise Reviewed, Physical Activity  [Encouraged]  Physical  Activity Discussed/Reviewed Physical Activity Discussed, Home Exercise Program (HEP), Physical Activity Reviewed, Types of exercise  [Encouraged]  Education Interventions   Education Provided Provided Therapist, sports, Provided Web-based Education, Provided Education  Provided Verbal Education On Nutrition, Mental Health/Coping with Illness, When to see the doctor, [employer], [employer], Medication, Exercise, Applications, Eye Care  [Encouraged]  Applications Medicaid, Personal Care Services  [Encouraged]  Mental Health Interventions   Mental Health Discussed/Reviewed Mental Health Discussed, Anxiety, Depression, Grief and Loss, Mental Health Reviewed, Coping Strategies, Substance Abuse, Suicide, Other, Crisis  [Domestic Violence]  Nutrition Interventions   Nutrition Discussed/Reviewed Nutrition Discussed, Nutrition Reviewed, Carbohydrate meal planning, Decreasing sugar intake, Decreasing salt, Portion sizes, Decreasing fats, Fluid intake, Adding fruits and vegetables, Increaing proteins  [Encouraged]  Pharmacy Interventions   Pharmacy Dicussed/Reviewed Pharmacy Topics Discussed, Medication Adherence, Affording Medications, Pharmacy Topics Reviewed  [Encouraged]  Safety Interventions   Safety Discussed/Reviewed Safety Discussed, Safety Reviewed, Fall Risk, Home Safety  [Encouraged]  Home Safety Assistive Devices, Need for home safety assessment, Refer for community resources  [Encouraged]  Advanced Directive Interventions   Advanced Directives Discussed/Reviewed Advanced Directives Discussed  [Completion Encouraged]     Active Listening & Reflection Utilized.  Verbalization of Feelings Encouraged.  Emotional Support Provided. Feelings of Loss of Independence Validated. Caregiver Stress Acknowledged. Caregiver Resources Reviewed. Caregiver Support Groups Provided. Self-Enrollment in Caregiver Support Group of Interest Emphasized. Problem Solving Interventions  Indicated. Task-Centered Solutions Employed.   Solution-Focused Strategies Activated. Acceptance & Commitment Therapy Implemented. Cognitive Behavioral Therapy Initiated. Client-Centered Therapy Performed. Continue to Receive Home Health Skilled Nursing, Physical Therapy, Occupational Therapy & Social Work Services, through [employer] [phone]). Continue to Work with Verlon Au, [facility] Social Worker with [facility] [phone]), to Receive Assistance Obtaining a Hospital Bed & Lift Chair for Home Use. Periodically  Contact Representative with [employer] [facility] (# [phone]), to Check Status of Name on Waiting List to Receive a Hospital Bed & Lift Chair for Home Use, at Much Reduced Cost. Continue to Receive Private Pay Caregiver Services, 5 Hours Per Day (9:00 AM - 2:00 PM), 3 Days Per Week [facility], Wednesday's & Friday's), to Assist with Activities of Daily Living. Encouraged Careers information officer with [employer], Wellsite geologist of Interest in [place], Nurse, learning disability, in An Effort to [employer] & Secure Mobility, from List Provided: ~ [employer] -      # [phone] ~ [employer] Program for Disabled Adults, through Aging,     Disability & Transit Services of [place] - # [phone] ~ [facility] Men's Association - # [phone] Ext. [phone] ~ [employer] Accessibility - # [phone] ~ [facility] - # [phone] Encouraged Direct Contact with [employer], Wellsite geologist of Interest in [place], Offering [employer], in An Effort to Obtain 24 Hour Care & Supervision in The Home, from List Provided: ~ In-Home Care & Respite Agencies          ~ Home Health Care Agencies          ~ Respite Care Agencies & Facilities Encouraged Careers information officer with CSW [phone]), if You Have Questions, Need Assistance, or If  Additional Social Work Needs Are Identified Between Now & Our Next Scheduled Follow-Up Outreach Call.      Our next appointment is by telephone on 08/19/2022 at 1:30 pm.   Please call the care guide team at [phone] if you need to cancel or reschedule your appointment.   If you are experiencing a Mental Health or Behavioral Health Crisis or need someone to talk to, please call the Suicide and Crisis Lifeline: 988 call the [place] National Suicide Prevention Lifeline: (585)071-7504 or TTY: (769)284-6845 TTY (574)013-2275) to talk to a trained counselor call 1-800-273-TALK (toll free, 24 hour hotline) go to [facility] [address] [place] [phone]) call the [employer] Crisis Line: [phone] call 911  Patient verbalizes understanding of instructions and care plan provided today and agrees to view in MyChart. Active MyChart status and patient understanding of how to access instructions and care plan via MyChart confirmed with patient.     Telephone follow up appointment with care management team member scheduled for:  08/19/2022 at 1:30 pm.   Danford Bad, BSW, MSW, LCSW  Licensed Clinical Social Worker  [facility]  Mailing [place] [place], [place] [address] Physical Address[address], [place], [place] [address] Toll Free Main # [phone] Fax # [phone] Cell # [phone] [email or web address]

## 2022-08-06 ENCOUNTER — Telehealth (INDEPENDENT_AMBULATORY_CARE_PROVIDER_SITE_OTHER): Payer: Medicare Other | Admitting: Family

## 2022-08-06 ENCOUNTER — Encounter: Payer: Self-pay | Admitting: Family

## 2022-08-06 VITALS — BP 116/63

## 2022-08-06 DIAGNOSIS — K219 Gastro-esophageal reflux disease without esophagitis: Secondary | ICD-10-CM

## 2022-08-06 DIAGNOSIS — Z91199 Patient's noncompliance with other medical treatment and regimen due to unspecified reason: Secondary | ICD-10-CM

## 2022-08-06 DIAGNOSIS — E785 Hyperlipidemia, unspecified: Secondary | ICD-10-CM

## 2022-08-06 DIAGNOSIS — I110 Hypertensive heart disease with heart failure: Secondary | ICD-10-CM | POA: Diagnosis not present

## 2022-08-06 DIAGNOSIS — E039 Hypothyroidism, unspecified: Secondary | ICD-10-CM | POA: Diagnosis not present

## 2022-08-06 DIAGNOSIS — Z8673 Personal history of transient ischemic attack (TIA), and cerebral infarction without residual deficits: Secondary | ICD-10-CM

## 2022-08-06 DIAGNOSIS — R110 Nausea: Secondary | ICD-10-CM

## 2022-08-06 DIAGNOSIS — I10 Essential (primary) hypertension: Secondary | ICD-10-CM

## 2022-08-06 DIAGNOSIS — I825Y2 Chronic embolism and thrombosis of unspecified deep veins of left proximal lower extremity: Secondary | ICD-10-CM

## 2022-08-06 DIAGNOSIS — I5032 Chronic diastolic (congestive) heart failure: Secondary | ICD-10-CM | POA: Diagnosis not present

## 2022-08-06 MED ORDER — PANTOPRAZOLE SODIUM 20 MG PO TBEC
20.0000 mg | DELAYED_RELEASE_TABLET | Freq: Every day | ORAL | 1 refills | Status: DC
Start: 2022-08-06 — End: 2022-09-14

## 2022-08-06 MED ORDER — ONDANSETRON HCL 4 MG PO TABS: 4.0000 mg | ORAL_TABLET | Freq: Three times a day (TID) | ORAL | 1 refills | Status: DC | PRN

## 2022-08-06 NOTE — Progress Notes (Signed)
Virtual Visit Consent   Megan [name], you are scheduled for a virtual visit with a [facility] provider today. Just as with appointments in the office, your consent must be obtained to participate. Your consent will be active for this visit and any virtual visit you may have with one of our providers in the next 365 days. If you have a MyChart account, a copy of this consent can be sent to you electronically.  As this is a virtual visit, video technology does not allow for your provider to perform a traditional examination. This may limit your provider's ability to fully assess your condition. If your provider identifies any concerns that need to be evaluated in person or the need to arrange testing (such as labs, EKG, etc.), we will make arrangements to do so. Although advances in technology are sophisticated, we cannot ensure that it will always work on either your end or our end. If the connection with a video visit is poor, the visit may have to be switched to a telephone visit. With either a video or telephone visit, we are not always able to ensure that we have a secure connection.  By engaging in this virtual visit, you consent to the provision of healthcare and authorize for your insurance to be billed (if applicable) for the services provided during this visit. Depending on your insurance coverage, you may receive a charge related to this service.  I need to obtain your verbal consent now. Are you willing to proceed with your visit today? Megan [name] has provided verbal consent on 08/06/2022 for a virtual visit (video or telephone). Jannifer Rodney, FNP  Date: 08/06/2022 2:00 PM  Virtual Visit via Video Note   I, Jannifer Rodney, connected with  Megan [name]  ([id number], June 27, 1945) on 08/06/22 at  8:05 AM EDT by a video-enabled telemedicine application and verified that I am speaking with the correct person using two identifiers.  Location: Patient: Virtual Visit Location Patient:  Home Provider: Virtual Visit Location Provider: Office/Clinic   I discussed the limitations of evaluation and management by telemedicine and the availability of in person appointments. The patient expressed understanding and agreed to proceed.    History of Present Illness: Megan [name] is a 77 y.o. who identifies as a female who was assigned female at birth, and is being seen today for hospital follow up and rehab. She went to the hospital on 05/31/22 for left DVT. She had thrombectomy by Vascular. She was discharged on Plavix and Eliquis. However, she could not afford the Eliquis because it $600.   She was discharged to SNF on 06/05/22. She was discharged home on 07/17/22.  She had a CVA and is suppose to follow up with Neurologists. She is unable to leave her home and gets EMS to get her from her home. She has Keppra on medication list, but not taking? She is followed by Neurologists and has follow up 12/29/22.   Reports nausea and decrease appetite.  Reports losing 75 lbs.   She is doing PT twice a week.   She is followed by Cardiologists and has pacemaker. Follow up on 08/11/22.  She has a hospital bed. Needs a lift chair, but her social worker working on this.   HPI: Gastroesophageal Reflux She complains of belching, heartburn and nausea. This is a chronic problem. The current episode started more than 1 year ago. The problem occurs occasionally. Associated symptoms include fatigue. She has tried a PPI for the symptoms. The  treatment provided mild relief.  Thyroid Problem Presents for follow-up visit. Symptoms include fatigue. Patient reports no anxiety. The symptoms have been stable.  Congestive Heart Failure Presents for follow-up visit. Associated symptoms include edema (left hand) and fatigue. Pertinent negatives include no shortness of breath.  Hypertension This is a chronic problem. The current episode started more than 1 year ago. The problem has been resolved since onset.  The problem is controlled. Associated symptoms include malaise/fatigue and peripheral edema. Pertinent negatives include no shortness of breath. Identifiable causes of hypertension include a thyroid problem.    Problems:  Patient Active Problem List   Diagnosis Date Noted   Leg pain 06/01/2022   Traumatic subdural hemorrhage, initial encounter (HCC) 08/16/2021   DNR (do not resuscitate) 06/12/2021   Possible Lytic bone lesions on CT 06/12/2021   Hyperglycemia 05/18/2021   Chronic congestive heart failure (HCC) 08/24/2019   Intertrigo 05/11/2017   OAB (overactive bladder) 02/22/2017   Lymphedema 01/26/2017   Gastroesophageal reflux disease without esophagitis 01/26/2017   Acquired hypothyroidism 01/26/2017   Mixed hyperlipidemia 01/26/2017   Generalized weakness 08/17/2015   Leukocytosis 08/17/2015   Pyrexia    Sleeps in sitting position due to orthopnea 07/31/2015   Morbid obesity due to excess calories (HCC) 06/09/2015   PVD (peripheral vascular disease) (HCC) 06/09/2015   Long term (current) use of anticoagulants 06/19/2010   Tachycardia-bradycardia syndrome (HCC) 11/18/2008   PPM-St.Jude 11/18/2008   HYPERTENSION, BENIGN 02/21/2008    Allergies: No Known Allergies Medications:  Current Outpatient Medications:    albuterol (VENTOLIN HFA) 108 (90 Base) MCG/ACT inhaler, INHALE 2 PUFFS BY MOUTH EVERY 6 HOURS AS NEEDED FOR WHEEZING OR SHORTNESS OF BREATH Strength 108 (90 Base) MCG/ACT, Disp: 18 g, Rfl: 0   blood glucose meter kit and supplies, Dispense based on patient and insurance preference. Use up to four times daily as directed. (FOR ICD-10 E10.9, E11.9)., Disp: 1 each, Rfl: 0   clopidogrel (PLAVIX) 75 MG tablet, Take 1 tablet (75 mg total) by mouth daily., Disp: , Rfl:    diltiazem (CARDIZEM CD) 240 MG 24 hr capsule, Take 1 capsule (240 mg total) by mouth daily., Disp: 90 capsule, Rfl: 2   ipratropium-albuterol (DUONEB) 0.5-2.5 (3) MG/3ML SOLN, Take 3 mLs by nebulization in the  morning and at bedtime. (Patient taking differently: Take 3 mLs by nebulization every 4 (four) hours as needed (Sheezing and shortness of breath).), Disp: 360 mL, Rfl: 0   levothyroxine (SYNTHROID) 150 MCG tablet, Take 1 tablet (150 mcg total) by mouth daily., Disp: 30 tablet, Rfl: 11   metoprolol tartrate (LOPRESSOR) 100 MG tablet, Take 1 tablet by mouth twice daily (Patient taking differently: Take 25 mg by mouth daily as needed (high blood pressure).), Disp: 60 tablet, Rfl: 2   ondansetron (ZOFRAN) 4 MG tablet, Take 1 tablet (4 mg total) by mouth every 8 (eight) hours as needed for nausea or vomiting., Disp: 90 tablet, Rfl: 1   pantoprazole (PROTONIX) 20 MG tablet, Take 1 tablet (20 mg total) by mouth daily., Disp: 90 tablet, Rfl: 1   apixaban (ELIQUIS) 5 MG TABS tablet, Take 1 tablet (5 mg total) by mouth 2 (two) times daily. (Patient not taking: Reported on 07/23/2022), Disp: 60 tablet, Rfl: 0   atorvastatin (LIPITOR) 80 MG tablet, Take 1 tablet by mouth once daily (Patient not taking: Reported on 08/06/2022), Disp: 90 tablet, Rfl: 1   furosemide (LASIX) 20 MG tablet, Take 1 tablet (20 mg total) by mouth daily. Follow volume status, repeat  labs (Patient not taking: Reported on 08/06/2022), Disp: 30 tablet, Rfl: 0   nystatin cream (MYCOSTATIN), Apply 1 Application topically daily as needed (yeast)., Disp: , Rfl:    Vitamin D, Ergocalciferol, (DRISDOL) 1.25 MG (50000 UNIT) CAPS capsule, Take 1 capsule by mouth once a week (Patient not taking: Reported on 08/06/2022), Disp: 12 capsule, Rfl: 1  Observations/Objective: Patient is well-developed, well-nourished in no acute distress.  Resting comfortably  at home.  Head is normocephalic, atraumatic.  No labored breathing. Speech is clear and coherent with logical content.  Patient is alert and oriented at baseline.    Assessment and Plan: 1. Chronic deep vein thrombosis (DVT) of proximal vein of left lower extremity (HCC) - CMP14+EGFR; Future - CBC  with Differential/Platelet; Future  2. Acquired hypothyroidism - CMP14+EGFR; Future - CBC with Differential/Platelet; Future - TSH; Future  3. Chronic diastolic congestive heart failure (HCC) - CMP14+EGFR; Future - CBC with Differential/Platelet; Future  4. Gastroesophageal reflux disease without esophagitis - pantoprazole (PROTONIX) 20 MG tablet; Take 1 tablet (20 mg total) by mouth daily.  Dispense: 90 tablet; Refill: 1 - CMP14+EGFR; Future - CBC with Differential/Platelet; Future  5. HYPERTENSION, BENIGN - CMP14+EGFR; Future - CBC with Differential/Platelet; Future  6. Morbid obesity due to excess calories (HCC) - CMP14+EGFR; Future - CBC with Differential/Platelet; Future  7. H/O: CVA (cerebrovascular accident) - CMP14+EGFR; Future - CBC with Differential/Platelet; Future - Lipid panel; Future  8. Nausea - ondansetron (ZOFRAN) 4 MG tablet; Take 1 tablet (4 mg total) by mouth every 8 (eight) hours as needed for nausea or vomiting.  Dispense: 90 tablet; Refill: 1 - CMP14+EGFR; Future - CBC with Differential/Platelet; Future  9. Hyperlipidemia, unspecified hyperlipidemia type - CMP14+EGFR; Future - CBC with Differential/Platelet; Future - Lipid panel; Future  10. Noncompliance  PT not able to leave house without EMS transport Continue with Home Health and PT Pt can not afford Eliquis, I have placed referral to Clinical Pharm to help with assistance. If she can not get medication will have to start Warfarin and do home INR. I am hesitant to do this because she is noncompliant Keep follow up with Cardiologists and Neurologists Needs follow up 1 month   Follow Up Instructions: I discussed the assessment and treatment plan with the patient. The patient was provided an opportunity to ask questions and all were answered. The patient agreed with the plan and demonstrated an understanding of the instructions.  A copy of instructions were sent to the patient via MyChart unless  otherwise noted below.     The patient was advised to call back or seek an in-person evaluation if the symptoms worsen or if the condition fails to improve as anticipated.  Time:  I spent 50  minutes with the patient via telehealth technology discussing the above problems/concerns, chart review,   Jannifer Rodney, FNP

## 2022-08-06 NOTE — Progress Notes (Addendum)
Talked w/ Rexford Maus w/ [facility], labs will be drawn next week and dropped off at our office.   PT calls back and states she does not have a hospital bed. States HH told her she could get one and thought they were in the process of getting her one. Did not realize we needed to write order. RX for hospital bed written.   Jannifer Rodney, FNP

## 2022-08-09 DIAGNOSIS — I679 Cerebrovascular disease, unspecified: Secondary | ICD-10-CM | POA: Diagnosis not present

## 2022-08-09 DIAGNOSIS — J449 Chronic obstructive pulmonary disease, unspecified: Secondary | ICD-10-CM | POA: Diagnosis not present

## 2022-08-09 DIAGNOSIS — I5032 Chronic diastolic (congestive) heart failure: Secondary | ICD-10-CM | POA: Diagnosis not present

## 2022-08-09 DIAGNOSIS — D72829 Elevated white blood cell count, unspecified: Secondary | ICD-10-CM | POA: Diagnosis not present

## 2022-08-09 DIAGNOSIS — I739 Peripheral vascular disease, unspecified: Secondary | ICD-10-CM | POA: Diagnosis not present

## 2022-08-09 DIAGNOSIS — I110 Hypertensive heart disease with heart failure: Secondary | ICD-10-CM | POA: Diagnosis not present

## 2022-08-09 NOTE — Addendum Note (Signed)
Addended by: Jannifer Rodney A on: 08/09/2022 02:48 PM   Modules accepted: Orders

## 2022-08-10 ENCOUNTER — Telehealth: Payer: Self-pay

## 2022-08-10 ENCOUNTER — Encounter: Payer: Self-pay | Admitting: *Deleted

## 2022-08-10 NOTE — Progress Notes (Signed)
  Chronic Care Management   Note  08/10/2022 Name: Serenah H Nop MRN: [id number] DOB: 12/14/1945  Kierria H Ayad is a 77 y.o. year old female who is a primary care patient of Hawks, Christy A, FNP. I reached out to Gladyce H Poynter by phone today in response to a referral sent by Ms. Avleen H Kinchen's PCP.  The first contact attempt was unsuccessful.   Follow up plan: Additional outreach attempts will be made.  Aedon Deason, RMA Care Guide [facility]  [place], [place] [address] Direct Dial: [phone] [email or web address]   

## 2022-08-10 NOTE — Telephone Encounter (Signed)
No remote transmission received since gen change.

## 2022-08-10 NOTE — Progress Notes (Signed)
  Chronic Care Management   Note  08/10/2022 Name: Megan [name] MRN: [id number] DOB: 10-21-1945  Megan [name] is a 77 y.o. year old female who is a primary care patient of Junie Spencer, FNP. I reached out to [name] by phone today in response to a referral sent by Ms. Teresa Pelton Stander's PCP.  The first contact attempt was unsuccessful.   Follow up plan: Additional outreach attempts will be made.  Penne Lash, RMA Care Guide [facility]  [place], [place] [address] Direct Dial: [phone] [email or web address]

## 2022-08-10 NOTE — Telephone Encounter (Signed)
Left detailed message for Pt requesting she send a remote transmission prior to her wound check tomorrow.  Left direct # to device clinic if any questions/issues.

## 2022-08-10 NOTE — Telephone Encounter (Signed)
Call back received from Pt.  Instruction given on sending a remote transmission.  Transmission received.

## 2022-08-10 NOTE — Progress Notes (Signed)
error 

## 2022-08-11 ENCOUNTER — Ambulatory Visit: Payer: Medicare Other | Attending: Cardiovascular Disease

## 2022-08-11 DIAGNOSIS — I110 Hypertensive heart disease with heart failure: Secondary | ICD-10-CM | POA: Diagnosis not present

## 2022-08-11 DIAGNOSIS — J449 Chronic obstructive pulmonary disease, unspecified: Secondary | ICD-10-CM | POA: Diagnosis not present

## 2022-08-11 DIAGNOSIS — Z7401 Bed confinement status: Secondary | ICD-10-CM | POA: Diagnosis not present

## 2022-08-11 DIAGNOSIS — I441 Atrioventricular block, second degree: Secondary | ICD-10-CM | POA: Diagnosis not present

## 2022-08-11 DIAGNOSIS — I739 Peripheral vascular disease, unspecified: Secondary | ICD-10-CM | POA: Diagnosis not present

## 2022-08-11 DIAGNOSIS — D72829 Elevated white blood cell count, unspecified: Secondary | ICD-10-CM | POA: Diagnosis not present

## 2022-08-11 DIAGNOSIS — I959 Hypotension, unspecified: Secondary | ICD-10-CM | POA: Diagnosis not present

## 2022-08-11 DIAGNOSIS — I679 Cerebrovascular disease, unspecified: Secondary | ICD-10-CM | POA: Diagnosis not present

## 2022-08-11 DIAGNOSIS — I5032 Chronic diastolic (congestive) heart failure: Secondary | ICD-10-CM | POA: Diagnosis not present

## 2022-08-11 LAB — CUP PACEART INCLINIC DEVICE CHECK
Battery Remaining Longevity: 141 mo
Battery Voltage: 3.13 V
Brady Statistic RA Percent Paced: 0 %
Brady Statistic RV Percent Paced: 1.3 %
Date Time Interrogation Session: 20240515104259
Implantable Lead Connection Status: 753985
Implantable Lead Connection Status: 753985
Implantable Lead Implant Date: 20100728
Implantable Lead Implant Date: 20100728
Implantable Lead Location: 753859
Implantable Lead Location: 753860
Implantable Pulse Generator Implant Date: 20240429
Lead Channel Impedance Value: 450 Ohm
Lead Channel Pacing Threshold Amplitude: 1 V
Lead Channel Pacing Threshold Amplitude: 1 V
Lead Channel Pacing Threshold Pulse Width: 0.5 ms
Lead Channel Pacing Threshold Pulse Width: 0.5 ms
Lead Channel Sensing Intrinsic Amplitude: 11 mV
Lead Channel Setting Pacing Amplitude: 2 V
Lead Channel Setting Pacing Pulse Width: 0.5 ms
Lead Channel Setting Sensing Sensitivity: 2 mV
Pulse Gen Model: 2272
Pulse Gen Serial Number: 8175976

## 2022-08-11 NOTE — Patient Instructions (Addendum)
After Your Pacemaker   Monitor your pacemaker site for redness, swelling, and drainage. Call the device clinic at [phone] if you experience these symptoms or fever/chills.  Your incision was closed with Steri-strips or staples:  You may shower 7 days after your procedure and wash your incision with soap and water. Avoid lotions, ointments, or perfumes over your incision until it is well-healed.  You may use a hot tub or a pool after your wound check appointment if the incision is completely closed.  There are no restrictions in arm movement after your wound check appointment.  You may drive, unless driving has been restricted by your healthcare providers.   Remote monitoring is used to monitor your pacemaker from home. This monitoring is scheduled every 91 days by our office. It allows us to keep an eye on the functioning of your device to ensure it is working properly. You will routinely see your Electrophysiologist annually (more often if necessary).  

## 2022-08-11 NOTE — Progress Notes (Signed)

## 2022-08-12 ENCOUNTER — Telehealth: Payer: Self-pay | Admitting: Pharmacist

## 2022-08-12 ENCOUNTER — Other Ambulatory Visit: Payer: Medicare Other

## 2022-08-12 ENCOUNTER — Ambulatory Visit (INDEPENDENT_AMBULATORY_CARE_PROVIDER_SITE_OTHER): Payer: Medicare Other | Admitting: Pharmacist

## 2022-08-12 DIAGNOSIS — R110 Nausea: Secondary | ICD-10-CM | POA: Diagnosis not present

## 2022-08-12 DIAGNOSIS — R69 Illness, unspecified: Secondary | ICD-10-CM | POA: Diagnosis not present

## 2022-08-12 DIAGNOSIS — D72829 Elevated white blood cell count, unspecified: Secondary | ICD-10-CM | POA: Diagnosis not present

## 2022-08-12 DIAGNOSIS — R5381 Other malaise: Secondary | ICD-10-CM | POA: Diagnosis not present

## 2022-08-12 DIAGNOSIS — I4891 Unspecified atrial fibrillation: Secondary | ICD-10-CM

## 2022-08-12 DIAGNOSIS — E039 Hypothyroidism, unspecified: Secondary | ICD-10-CM | POA: Diagnosis not present

## 2022-08-12 DIAGNOSIS — I679 Cerebrovascular disease, unspecified: Secondary | ICD-10-CM | POA: Diagnosis not present

## 2022-08-12 DIAGNOSIS — J449 Chronic obstructive pulmonary disease, unspecified: Secondary | ICD-10-CM | POA: Diagnosis not present

## 2022-08-12 DIAGNOSIS — I825Y2 Chronic embolism and thrombosis of unspecified deep veins of left proximal lower extremity: Secondary | ICD-10-CM | POA: Diagnosis not present

## 2022-08-12 DIAGNOSIS — M79605 Pain in left leg: Secondary | ICD-10-CM

## 2022-08-12 DIAGNOSIS — I10 Essential (primary) hypertension: Secondary | ICD-10-CM | POA: Diagnosis not present

## 2022-08-12 DIAGNOSIS — E785 Hyperlipidemia, unspecified: Secondary | ICD-10-CM

## 2022-08-12 DIAGNOSIS — Z8673 Personal history of transient ischemic attack (TIA), and cerebral infarction without residual deficits: Secondary | ICD-10-CM

## 2022-08-12 DIAGNOSIS — I5032 Chronic diastolic (congestive) heart failure: Secondary | ICD-10-CM

## 2022-08-12 DIAGNOSIS — I110 Hypertensive heart disease with heart failure: Secondary | ICD-10-CM | POA: Diagnosis not present

## 2022-08-12 DIAGNOSIS — K219 Gastro-esophageal reflux disease without esophagitis: Secondary | ICD-10-CM

## 2022-08-12 DIAGNOSIS — I739 Peripheral vascular disease, unspecified: Secondary | ICD-10-CM | POA: Diagnosis not present

## 2022-08-12 LAB — CMP14+EGFR

## 2022-08-12 LAB — LIPID PANEL

## 2022-08-12 LAB — CBC WITH DIFFERENTIAL/PLATELET
Basophils Absolute: 0 10*3/uL (ref 0.0–0.2)
Basos: 0 %
Lymphocytes Absolute: 0.9 10*3/uL (ref 0.7–3.1)
Lymphs: 9 %
MCHC: 32.9 g/dL (ref 31.5–35.7)
Neutrophils: 83 %
Platelets: 302 10*3/uL (ref 150–450)

## 2022-08-12 NOTE — Progress Notes (Signed)
    08/12/2022 Name: Megan [name] MRN: [id number] DOB: July 15, 1945   Patient is s/p hospitalization for LET DVT on 05/31/22. She had a thrombectomy by Vascular & was discharged on Plavix and Eliquis.  She could not afford the Eliquis because it $600. She remains on samples (Eliquis) from PCP.  She does not qualify for Eliquis PAP or Medicaid/LIS extra help.  She will have to go on warfarin (Coumadin) and likely have home INRs drawn (due to inability of leaving her home without help).  Additional 3 weeks of Eliquis samples left up front to bridge warfarin transition/home INRs/etc.   Patient states she has only been taking Eliquis once daily.  Instructed patient to take Elqiuis twice daily and continue all other medications as prescribed.  Patient has a total of 4 weeks of Eliquis at home.  Will route note to PCP to address f/u and warfarin plan. Patient denies signs/symptoms of bleeding.   Kieth Brightly, PharmD, BCACP Clinical Pharmacist, [facility]

## 2022-08-12 NOTE — Telephone Encounter (Signed)
    08/12/2022 Name: Megan [name] MRN: [id number] DOB: 1945-06-13   An unsuccessful telephone outreach was attempted today.  The patient was referred to Pharmacy for medication management     Follow Up Plan: Patient encouraged to return call.      Kieth Brightly, PharmD, BCACP Clinical Pharmacist, [facility]

## 2022-08-13 DIAGNOSIS — J449 Chronic obstructive pulmonary disease, unspecified: Secondary | ICD-10-CM | POA: Diagnosis not present

## 2022-08-13 DIAGNOSIS — I5032 Chronic diastolic (congestive) heart failure: Secondary | ICD-10-CM | POA: Diagnosis not present

## 2022-08-13 DIAGNOSIS — D72829 Elevated white blood cell count, unspecified: Secondary | ICD-10-CM | POA: Diagnosis not present

## 2022-08-13 DIAGNOSIS — I679 Cerebrovascular disease, unspecified: Secondary | ICD-10-CM | POA: Diagnosis not present

## 2022-08-13 DIAGNOSIS — I110 Hypertensive heart disease with heart failure: Secondary | ICD-10-CM | POA: Diagnosis not present

## 2022-08-13 DIAGNOSIS — I739 Peripheral vascular disease, unspecified: Secondary | ICD-10-CM | POA: Diagnosis not present

## 2022-08-13 LAB — CMP14+EGFR
AST: 42 IU/L — ABNORMAL HIGH (ref 0–40)
Alkaline Phosphatase: 172 IU/L — ABNORMAL HIGH (ref 44–121)
BUN/Creatinine Ratio: 5 — ABNORMAL LOW (ref 12–28)
BUN: 5 mg/dL — ABNORMAL LOW (ref 8–27)
Bilirubin Total: 1 mg/dL (ref 0.0–1.2)
Calcium: 8.1 mg/dL — ABNORMAL LOW (ref 8.7–10.3)
Chloride: 96 mmol/L (ref 96–106)
Potassium: 3.6 mmol/L (ref 3.5–5.2)
Sodium: 135 mmol/L (ref 134–144)
eGFR: 54 mL/min/{1.73_m2} — ABNORMAL LOW (ref >59)

## 2022-08-13 LAB — CBC WITH DIFFERENTIAL/PLATELET
EOS (ABSOLUTE): 0 10*3/uL (ref 0.0–0.4)
Eos: 0 %
Hematocrit: 38.3 % (ref 34.0–46.6)
Hemoglobin: 12.6 g/dL (ref 11.1–15.9)
Immature Grans (Abs): 0 10*3/uL (ref 0.0–0.1)
Immature Granulocytes: 0 %
MCH: 31 pg (ref 26.6–33.0)
MCV: 94 fL (ref 79–97)
Monocytes Absolute: 0.7 10*3/uL (ref 0.1–0.9)
Monocytes: 8 %
Neutrophils Absolute: 7.8 10*3/uL — ABNORMAL HIGH (ref 1.4–7.0)
RBC: 4.06 x10E6/uL (ref 3.77–5.28)
RDW: 14.5 % (ref 11.7–15.4)
WBC: 9.5 10*3/uL (ref 3.4–10.8)

## 2022-08-13 LAB — LIPID PANEL
Cholesterol, Total: 183 mg/dL (ref 100–199)
HDL: 39 mg/dL — ABNORMAL LOW (ref >39)
LDL Chol Calc (NIH): 119 mg/dL — ABNORMAL HIGH (ref 0–99)
Triglycerides: 142 mg/dL (ref 0–149)

## 2022-08-13 LAB — TSH: TSH: 0.254 u[IU]/mL — ABNORMAL LOW (ref 0.450–4.500)

## 2022-08-14 DIAGNOSIS — I739 Peripheral vascular disease, unspecified: Secondary | ICD-10-CM | POA: Diagnosis not present

## 2022-08-14 DIAGNOSIS — J449 Chronic obstructive pulmonary disease, unspecified: Secondary | ICD-10-CM | POA: Diagnosis not present

## 2022-08-14 DIAGNOSIS — D72829 Elevated white blood cell count, unspecified: Secondary | ICD-10-CM | POA: Diagnosis not present

## 2022-08-14 DIAGNOSIS — I5032 Chronic diastolic (congestive) heart failure: Secondary | ICD-10-CM | POA: Diagnosis not present

## 2022-08-14 DIAGNOSIS — I110 Hypertensive heart disease with heart failure: Secondary | ICD-10-CM | POA: Diagnosis not present

## 2022-08-14 DIAGNOSIS — I679 Cerebrovascular disease, unspecified: Secondary | ICD-10-CM | POA: Diagnosis not present

## 2022-08-16 ENCOUNTER — Telehealth: Payer: Self-pay | Admitting: Family

## 2022-08-16 DIAGNOSIS — D72829 Elevated white blood cell count, unspecified: Secondary | ICD-10-CM | POA: Diagnosis not present

## 2022-08-16 DIAGNOSIS — I739 Peripheral vascular disease, unspecified: Secondary | ICD-10-CM | POA: Diagnosis not present

## 2022-08-16 DIAGNOSIS — J449 Chronic obstructive pulmonary disease, unspecified: Secondary | ICD-10-CM | POA: Diagnosis not present

## 2022-08-16 DIAGNOSIS — K219 Gastro-esophageal reflux disease without esophagitis: Secondary | ICD-10-CM

## 2022-08-16 DIAGNOSIS — I110 Hypertensive heart disease with heart failure: Secondary | ICD-10-CM | POA: Diagnosis not present

## 2022-08-16 DIAGNOSIS — I5032 Chronic diastolic (congestive) heart failure: Secondary | ICD-10-CM | POA: Diagnosis not present

## 2022-08-16 DIAGNOSIS — I679 Cerebrovascular disease, unspecified: Secondary | ICD-10-CM | POA: Diagnosis not present

## 2022-08-16 NOTE — Telephone Encounter (Signed)
Pt called to check on status of order for hospital bed. She says a nurse from [facility] came to visit her about 2 weeks ago and told pt that she was going to reach out to pts PCP and see about getting a hospital bed ordered for her. Pt says she hasn't heard anything from anyone since that conversation about it.   Please advise and call patient.   Pt also wanted to mention to PCP that the nausea medicine that she has been taking, is not working for her. Pt says she is just as sick as ever.

## 2022-08-16 NOTE — Telephone Encounter (Signed)
REFERRAL REQUEST Telephone Note  Have you been seen at our office for this problem? yes (Advise that they may need an appointment with their PCP before a referral can be done)  Reason for Referral: still having nausea and can't eat. She wants to go to gi Referral discussed with patient: pt said she discussed it at last virtual appt  Best contact number of patient for referral team: [phone]    Has patient been seen by a specialist for this issue before: no  Patient provider preference for referral: na Patient location preference for referral: na   Patient notified that referrals can take up to a week or longer to process. If they haven't heard anything within a week they should call back and speak with the referral department.

## 2022-08-17 DIAGNOSIS — I739 Peripheral vascular disease, unspecified: Secondary | ICD-10-CM | POA: Diagnosis not present

## 2022-08-17 DIAGNOSIS — I5032 Chronic diastolic (congestive) heart failure: Secondary | ICD-10-CM | POA: Diagnosis not present

## 2022-08-17 DIAGNOSIS — D72829 Elevated white blood cell count, unspecified: Secondary | ICD-10-CM | POA: Diagnosis not present

## 2022-08-17 DIAGNOSIS — I679 Cerebrovascular disease, unspecified: Secondary | ICD-10-CM | POA: Diagnosis not present

## 2022-08-17 DIAGNOSIS — J449 Chronic obstructive pulmonary disease, unspecified: Secondary | ICD-10-CM | POA: Diagnosis not present

## 2022-08-17 DIAGNOSIS — I110 Hypertensive heart disease with heart failure: Secondary | ICD-10-CM | POA: Diagnosis not present

## 2022-08-17 NOTE — Telephone Encounter (Signed)
Ok for referral?

## 2022-08-17 NOTE — Telephone Encounter (Signed)
Spoke to patient. She has not heard from anybody regarding DME orders for a hospital bed and a lift chair. I read last note.  It states she has a hospital bed. I asked the patient and she says she does not. I informed that Neysa Bonito will not be back in office till Thursday.

## 2022-08-17 NOTE — Telephone Encounter (Signed)
Referral placed.

## 2022-08-18 DIAGNOSIS — G40909 Epilepsy, unspecified, not intractable, without status epilepticus: Secondary | ICD-10-CM | POA: Diagnosis not present

## 2022-08-18 DIAGNOSIS — I110 Hypertensive heart disease with heart failure: Secondary | ICD-10-CM | POA: Diagnosis not present

## 2022-08-18 DIAGNOSIS — G4733 Obstructive sleep apnea (adult) (pediatric): Secondary | ICD-10-CM | POA: Diagnosis not present

## 2022-08-18 DIAGNOSIS — E039 Hypothyroidism, unspecified: Secondary | ICD-10-CM | POA: Diagnosis not present

## 2022-08-18 DIAGNOSIS — M439 Deforming dorsopathy, unspecified: Secondary | ICD-10-CM | POA: Diagnosis not present

## 2022-08-18 DIAGNOSIS — I739 Peripheral vascular disease, unspecified: Secondary | ICD-10-CM | POA: Diagnosis not present

## 2022-08-18 DIAGNOSIS — Z7902 Long term (current) use of antithrombotics/antiplatelets: Secondary | ICD-10-CM | POA: Diagnosis not present

## 2022-08-18 DIAGNOSIS — Z993 Dependence on wheelchair: Secondary | ICD-10-CM | POA: Diagnosis not present

## 2022-08-18 DIAGNOSIS — I5032 Chronic diastolic (congestive) heart failure: Secondary | ICD-10-CM | POA: Diagnosis not present

## 2022-08-18 DIAGNOSIS — J449 Chronic obstructive pulmonary disease, unspecified: Secondary | ICD-10-CM | POA: Diagnosis not present

## 2022-08-18 DIAGNOSIS — F419 Anxiety disorder, unspecified: Secondary | ICD-10-CM | POA: Diagnosis not present

## 2022-08-18 DIAGNOSIS — Z556 Problems related to health literacy: Secondary | ICD-10-CM | POA: Diagnosis not present

## 2022-08-18 DIAGNOSIS — I890 Lymphedema, not elsewhere classified: Secondary | ICD-10-CM | POA: Diagnosis not present

## 2022-08-18 DIAGNOSIS — Z950 Presence of cardiac pacemaker: Secondary | ICD-10-CM | POA: Diagnosis not present

## 2022-08-18 DIAGNOSIS — I4821 Permanent atrial fibrillation: Secondary | ICD-10-CM | POA: Diagnosis not present

## 2022-08-18 DIAGNOSIS — D72829 Elevated white blood cell count, unspecified: Secondary | ICD-10-CM | POA: Diagnosis not present

## 2022-08-18 DIAGNOSIS — I700 Atherosclerosis of aorta: Secondary | ICD-10-CM | POA: Diagnosis not present

## 2022-08-18 DIAGNOSIS — I679 Cerebrovascular disease, unspecified: Secondary | ICD-10-CM | POA: Diagnosis not present

## 2022-08-18 DIAGNOSIS — E782 Mixed hyperlipidemia: Secondary | ICD-10-CM | POA: Diagnosis not present

## 2022-08-18 DIAGNOSIS — R4189 Other symptoms and signs involving cognitive functions and awareness: Secondary | ICD-10-CM | POA: Diagnosis not present

## 2022-08-18 DIAGNOSIS — I441 Atrioventricular block, second degree: Secondary | ICD-10-CM | POA: Diagnosis not present

## 2022-08-19 ENCOUNTER — Encounter (INDEPENDENT_AMBULATORY_CARE_PROVIDER_SITE_OTHER): Payer: Self-pay | Admitting: *Deleted

## 2022-08-19 ENCOUNTER — Ambulatory Visit: Payer: Self-pay | Admitting: *Deleted

## 2022-08-19 ENCOUNTER — Encounter: Payer: Self-pay | Admitting: *Deleted

## 2022-08-19 ENCOUNTER — Telehealth: Payer: Self-pay | Admitting: Family

## 2022-08-19 DIAGNOSIS — I739 Peripheral vascular disease, unspecified: Secondary | ICD-10-CM | POA: Diagnosis not present

## 2022-08-19 DIAGNOSIS — I110 Hypertensive heart disease with heart failure: Secondary | ICD-10-CM | POA: Diagnosis not present

## 2022-08-19 DIAGNOSIS — D72829 Elevated white blood cell count, unspecified: Secondary | ICD-10-CM | POA: Diagnosis not present

## 2022-08-19 DIAGNOSIS — I5032 Chronic diastolic (congestive) heart failure: Secondary | ICD-10-CM | POA: Diagnosis not present

## 2022-08-19 DIAGNOSIS — I679 Cerebrovascular disease, unspecified: Secondary | ICD-10-CM | POA: Diagnosis not present

## 2022-08-19 DIAGNOSIS — J449 Chronic obstructive pulmonary disease, unspecified: Secondary | ICD-10-CM | POA: Diagnosis not present

## 2022-08-19 NOTE — Telephone Encounter (Signed)
  Prescription Request  08/19/2022  Is this a "Controlled Substance" medicine? no  Have you seen your PCP in the last 2 weeks? Yes 08/06/22  If YES, route message to pool  -  If NO, patient needs to be scheduled for appointment.  What is the name of the medication or equipment? Lotrazole? Pt no sure of the name it is for yeast  Have you contacted your pharmacy to request a refill? yes   Which pharmacy would you like this sent to? [facility]   Patient notified that their request is being sent to the clinical staff for review and that they should receive a response within 2 business days.

## 2022-08-19 NOTE — Patient Instructions (Signed)
Visit Information  Thank you for taking time to visit with me today. Please don't hesitate to contact me if I can be of assistance to you.   Following are the goals we discussed today:   Goals Addressed               This Visit's Progress     Receive Assistance Applying for Medicaid & Personal Care Services. (pt-stated)   On track     Care Coordination Interventions:  Interventions Today    Flowsheet Row Most Recent Value  Chronic Disease   Chronic disease during today's visit Congestive Heart Failure (CHF), Hypertension (HTN), Other  [Morbid Obesity, Inability to Perform Activities of Daily Living Independently, Caregiver Stress, Needs Wheelchair Ramp Installed for Mobility & Safety & Generalized Weakness.]  General Interventions   General Interventions Discussed/Reviewed General Interventions Discussed, General Interventions Reviewed, Annual Eye Exam, Labs, Durable Medical Equipment (DME), [employer], Level of Care, Communication with, Health Screening, Doctor Visits, Vaccines  [Communication with Primary Care Provider]  Labs Kidney Function  [Encouraged]  Vaccines COVID-19, Flu, Pneumonia, RSV, Shingles, Tetanus/Pertussis/Diphtheria  [Encouraged]  Doctor Visits Discussed/Reviewed Doctor Visits Discussed, Specialist, Doctor Visits Reviewed, Annual Wellness Visits, PCP  [Encouraged]  Health Screening Bone Density, Colonoscopy, Mammogram  [Encouraged]  Durable Medical Equipment (DME) Wheelchair  Wheelchair Standard  PCP/Specialist Visits Compliance with follow-up visit  [Encouraged]  Communication with PCP/Specialists, RN  Level of Care Adult Daycare, Personal Care Services, Applications, Assisted Living, Skilled Nursing Facility  [Encouraged]  Applications Medicaid, Personal Care Services  [Encouraged]  Exercise Interventions   Exercise Discussed/Reviewed Exercise Discussed, Assistive device use and maintanence, Exercise Reviewed, Physical Activity  [Encouraged]  Physical  Activity Discussed/Reviewed Physical Activity Discussed, Home Exercise Program (HEP), Physical Activity Reviewed, Types of exercise  [Encouraged]  Education Interventions   Education Provided Provided Therapist, sports, Provided Web-based Education, Provided Education  Provided Verbal Education On Nutrition, Mental Health/Coping with Illness, When to see the doctor, [employer], [employer], Medication, Exercise, Applications, Eye Care  [Encouraged]  Applications Medicaid, Personal Care Services  [Encouraged]  Mental Health Interventions   Mental Health Discussed/Reviewed Mental Health Discussed, Anxiety, Depression, Grief and Loss, Mental Health Reviewed, Coping Strategies, Substance Abuse, Suicide, Other, Crisis  [Domestic Violence]  Nutrition Interventions   Nutrition Discussed/Reviewed Nutrition Discussed, Nutrition Reviewed, Carbohydrate meal planning, Decreasing sugar intake, Decreasing salt, Portion sizes, Decreasing fats, Fluid intake, Adding fruits and vegetables, Increaing proteins  [Encouraged]  Pharmacy Interventions   Pharmacy Dicussed/Reviewed Pharmacy Topics Discussed, Medication Adherence, Affording Medications, Pharmacy Topics Reviewed  [Encouraged]  Safety Interventions   Safety Discussed/Reviewed Safety Discussed, Safety Reviewed, Fall Risk, Home Safety  [Encouraged]  Home Safety Assistive Devices, Need for home safety assessment, Refer for community resources  [Encouraged]  Advanced Directive Interventions   Advanced Directives Discussed/Reviewed Advanced Directives Discussed  [Completion Encouraged]     Active Listening & Reflection Utilized.  Verbalization of Feelings Encouraged.  Emotional Support Provided. Feelings of Frustration Regarding Loss Mobility & Independence Validated. Symptoms of Anxiousness & Anxiety Acknowledged. Caregiver Resources Reviewed. Problem Solving Interventions Indicated. Task-Centered Solutions Employed.   Solution-Focused  Strategies Activated. Acceptance & Commitment Therapy Performed. Cognitive Behavioral Therapy Initiated. Client-Centered Therapy Implemented. Continue to Receive Home Health Skilled Nursing, Physical Therapy, Occupational Therapy & Social Work Services, through [employer] [phone]). Continue to Work with Verlon Au, [facility] Social Worker with [facility] [phone]), to Receive Assistance Obtaining a Hospital Bed & Lift Chair for Home Use. CSW Collaboration with Verlon Au, Home Health Social  Worker with [facility] [phone]), to Check Status of Obtaining Hospital Bed & Lift Chair for Home Use.  ~ HIPAA Compliant Messages Left on Voicemail, as CSW Awaits a Return             Call. Continue to Follow-Up with Representative from [facility] (# [phone]), to Check Status of Name on Waiting List to Receive a Hospital Bed & Lift Chair for Home Use, If Unable to Obtain Assistance from [place], [facility] Social Worker with [facility] [phone]). Continue to Receive Private Pay Caregiver Services, 5 Hours Per Day (9:00 AM - 2:00 PM), 5 Days Per Week (Monday through Friday), to Assist with Activities of Daily Living, Perform Light Housekeeping Duties, Meal Preparation, Grocery Shopping, Medication Administration, Etc. Please Contact the Following List of [employer], Services & Resources in [place], Offering Private Pay In-Home Caregiver Services, in An Effort to Obtain Care & Supervision on Weekends (Saturday & Sunday): ~ In-Home Care & Respite Agencies ~ Home Health Care Agencies ~ Respite Care Agencies & Facilities Please Contact the Following List of [employer], Services & Resources in [place], Offering Reduced Network engineer, in An Effort to [employer] & Secure Mobility in The Home: ~ [employer] Independent Living Rehabilitation Program -     # [phone] ~ [employer]  Alternative Program for Disabled Adults, through Aging,     Disability & Transit Services of [place] - # [phone] ~ [facility] Men's Association - # 734-884-1443 Ext. [phone] ~ [employer] Accessibility - # [phone] ~ [employer]You - # [phone] Encouraged Direct Contact with CSW (# [phone]), if You Have Questions, Need Assistance, or If Additional Social Work Needs Are Identified Between Now & Our Next Scheduled Follow-Up Outreach Call.      Our next appointment is by telephone on 08/31/2022 at 4:00 pm.   Please call the care guide team at [phone] if you need to cancel or reschedule your appointment.   If you are experiencing a Mental Health or Behavioral Health Crisis or need someone to talk to, please call the Suicide and Crisis Lifeline: 988 call the [place] National Suicide Prevention Lifeline: 765 858 9611 or TTY: (551)841-3195 TTY 380-220-9828) to talk to a trained counselor call 1-800-273-TALK (toll free, 24 hour hotline) go to [facility] [address] [place] [phone]) call the [facility] Crisis Line: [phone] call 911  Patient verbalizes understanding of instructions and care plan provided today and agrees to view in MyChart. Active MyChart status and patient understanding of how to access instructions and care plan via MyChart confirmed with patient.     Telephone follow up appointment with care management team member scheduled for:  08/31/2022 at 4:00 pm.   Danford Bad, BSW, MSW, LCSW  Licensed Clinical Social Worker  [facility]  Mailing [place]. [address] [place], [place] [address] Physical Address[address] [place], [place] [address] Toll Free Main # [phone] Fax # [phone] Cell # [phone] [email or web address]

## 2022-08-19 NOTE — Patient Outreach (Signed)
Care Coordination   Follow Up Visit Note   08/19/2022  Name: Megan [name] MRN: [id number] DOB: [birth year]  Megan [name] is a 77 y.o. year old female who sees Lendon Colonel, Edilia Bo, FNP for primary care. I spoke with [name] by phone today.  What matters to the patients health and wellness today?  Receive Assistance Applying for Medicaid & Personal Care Services.   Goals Addressed               This Visit's Progress     Receive Assistance Applying for Medicaid & Personal Care Services. (pt-stated)   On track     Care Coordination Interventions:  Interventions Today    Flowsheet Row Most Recent Value  Chronic Disease   Chronic disease during today's visit Congestive Heart Failure (CHF), Hypertension (HTN), Other  [Morbid Obesity, Inability to Perform Activities of Daily Living Independently, Caregiver Stress, Needs Wheelchair Ramp Installed for Mobility & Safety & Generalized Weakness.]  General Interventions   General Interventions Discussed/Reviewed General Interventions Discussed, General Interventions Reviewed, Annual Eye Exam, Labs, Durable Medical Equipment (DME), [employer], Level of Care, Communication with, Health Screening, Doctor Visits, Vaccines  [Communication with Primary Care Provider]  Labs Kidney Function  [Encouraged]  Vaccines COVID-19, Flu, Pneumonia, RSV, Shingles, Tetanus/Pertussis/Diphtheria  [Encouraged]  Doctor Visits Discussed/Reviewed Doctor Visits Discussed, Specialist, Doctor Visits Reviewed, Annual Wellness Visits, PCP  [Encouraged]  Health Screening Bone Density, Colonoscopy, Mammogram  [Encouraged]  Durable Medical Equipment (DME) Wheelchair  Wheelchair Standard  PCP/Specialist Visits Compliance with follow-up visit  [Encouraged]  Communication with PCP/Specialists, RN  Level of Care Adult Daycare, Personal Care Services, Applications, Assisted Living, Skilled Nursing Facility  [Encouraged]  Applications Medicaid, Personal Care  Services  [Encouraged]  Exercise Interventions   Exercise Discussed/Reviewed Exercise Discussed, Assistive device use and maintanence, Exercise Reviewed, Physical Activity  [Encouraged]  Physical Activity Discussed/Reviewed Physical Activity Discussed, Home Exercise Program (HEP), Physical Activity Reviewed, Types of exercise  [Encouraged]  Education Interventions   Education Provided Provided Therapist, sports, Provided Web-based Education, Provided Education  Provided Verbal Education On Nutrition, Mental Health/Coping with Illness, When to see the doctor, [employer], [employer], Medication, Exercise, Applications, Eye Care  [Encouraged]  Applications Medicaid, Personal Care Services  [Encouraged]  Mental Health Interventions   Mental Health Discussed/Reviewed Mental Health Discussed, Anxiety, Depression, Grief and Loss, Mental Health Reviewed, Coping Strategies, Substance Abuse, Suicide, Other, Crisis  [Domestic Violence]  Nutrition Interventions   Nutrition Discussed/Reviewed Nutrition Discussed, Nutrition Reviewed, Carbohydrate meal planning, Decreasing sugar intake, Decreasing salt, Portion sizes, Decreasing fats, Fluid intake, Adding fruits and vegetables, Increaing proteins  [Encouraged]  Pharmacy Interventions   Pharmacy Dicussed/Reviewed Pharmacy Topics Discussed, Medication Adherence, Affording Medications, Pharmacy Topics Reviewed  [Encouraged]  Safety Interventions   Safety Discussed/Reviewed Safety Discussed, Safety Reviewed, Fall Risk, Home Safety  [Encouraged]  Home Safety Assistive Devices, Need for home safety assessment, Refer for community resources  [Encouraged]  Advanced Directive Interventions   Advanced Directives Discussed/Reviewed Advanced Directives Discussed  [Completion Encouraged]     Active Listening & Reflection Utilized.  Verbalization of Feelings Encouraged.  Emotional Support Provided. Feelings of Frustration Regarding Loss Mobility &  Independence Validated. Symptoms of Anxiousness & Anxiety Acknowledged. Caregiver Resources Reviewed. Problem Solving Interventions Indicated. Task-Centered Solutions Employed.   Solution-Focused Strategies Activated. Acceptance & Commitment Therapy Performed. Cognitive Behavioral Therapy Initiated. Client-Centered Therapy Implemented. Continue to Receive Home Health Skilled Nursing, Physical Therapy, Occupational Therapy & Social Work Services, through [employer] [phone]). Continue to  Work with Verlon Au, [facility] Social Worker with [facility] [phone]), to Receive Assistance Obtaining a Hospital Bed & Lift Chair for Home Use. CSW Collaboration with Verlon Au, [facility] Social Worker with [facility] [phone]), to Check Status of Obtaining Hospital Bed & Lift Chair for Home Use.       ~ HIPAA Compliant Messages Left on Voicemail, as CSW Awaits a Return           Call. Continue to Follow-Up with Representative from [facility] (# [phone]), to Check Status of Name on Waiting List to Receive a Hospital Bed & Lift Chair for Home Use, If Unable to Obtain Assistance from [place], [facility] Social Worker with [facility] [phone]). Continue to Receive Private Pay Caregiver Services, 5 Hours Per Day (9:00 AM - 2:00 PM), 5 Days Per Week (Monday through Friday), to Assist with Activities of Daily Living, Perform Light Housekeeping Duties, Meal Preparation, Grocery Shopping, Medication Administration, Etc. Please Contact the Following List of [employer], Services & Resources in [place], Offering Private Pay In-Home Caregiver Services, in An Effort to Obtain Care & Supervision on Weekends (Saturday & Sunday): ~ In-Home Care & Respite Agencies ~ Home Health Care Agencies ~ Respite Care Agencies & Facilities Please Contact the Following List of [employer], Services & Resources in [place],  Offering Reduced Network engineer, in An Effort to [employer] & Secure Mobility in The Home: ~ [employer] Independent Living Rehabilitation Program -      # [phone] ~ [employer] Alternative Program for Disabled Adults, through Aging,     Disability & Transit Services of [place] - # [phone] ~ [facility] Men's Association - # 540-450-8629 Ext. [phone] ~ [employer] Accessibility - # [phone] ~ HomeYou - # [phone] Encouraged Direct Contact with CSW (# [phone]), if You Have Questions, Need Assistance, or If Additional Social Work Needs Are Identified Between Now & Our Next Scheduled Follow-Up Outreach Call.      SDOH assessments and interventions completed:  Yes.  Care Coordination Interventions:  Yes, provided.   Follow up plan: Follow up call scheduled for 08/31/2022 at 4:00 pm.   Encounter Outcome:  Pt. Visit Completed.   Danford Bad, BSW, MSW, LCSW  Licensed Restaurant manager, [facility]  Mailing [place], [place], [place] [address] Physical Address-[address], [place], [place] [address] Toll Free Main # [phone] Fax # [phone] Cell # [phone] [email or web address]

## 2022-08-20 ENCOUNTER — Telehealth: Payer: Self-pay | Admitting: Family

## 2022-08-20 ENCOUNTER — Other Ambulatory Visit: Payer: Self-pay | Admitting: Family

## 2022-08-20 DIAGNOSIS — Z7901 Long term (current) use of anticoagulants: Secondary | ICD-10-CM

## 2022-08-20 DIAGNOSIS — Z86718 Personal history of other venous thrombosis and embolism: Secondary | ICD-10-CM

## 2022-08-20 MED ORDER — WARFARIN SODIUM 5 MG PO TABS
5.0000 mg | ORAL_TABLET | Freq: Every day | ORAL | 0 refills | Status: DC
Start: 2022-08-20 — End: 2022-09-14

## 2022-08-20 MED ORDER — CLOTRIMAZOLE-BETAMETHASONE 1-0.05 % EX CREA: 1.0000 | TOPICAL_CREAM | Freq: Every day | CUTANEOUS | 0 refills | Status: DC

## 2022-08-20 MED ORDER — LEVOTHYROXINE SODIUM 125 MCG PO TABS: 125.0000 ug | ORAL_TABLET | Freq: Every day | ORAL | 1 refills | Status: DC

## 2022-08-20 MED ORDER — CALCIUM CARBONATE 500 MG PO CHEW: 500.0000 mg | CHEWABLE_TABLET | Freq: Two times a day (BID) | ORAL | 1 refills | Status: DC

## 2022-08-20 NOTE — Telephone Encounter (Signed)
Faxed to [facility] per patient

## 2022-08-20 NOTE — Telephone Encounter (Signed)
Ok, please fax.   Jannifer Rodney, FNP

## 2022-08-20 NOTE — Addendum Note (Signed)
Addended by: Jannifer Rodney A on: 08/20/2022 01:00 PM   Modules accepted: Orders

## 2022-08-20 NOTE — Telephone Encounter (Signed)
She does not have one needs on order.

## 2022-08-20 NOTE — Telephone Encounter (Signed)
Patient needs the home INR ordered. Patient aware .

## 2022-08-20 NOTE — Telephone Encounter (Signed)
Called and went over with her.

## 2022-08-20 NOTE — Telephone Encounter (Signed)
Patient aware we do not have states she does not know want to do? Please advise can't afford.

## 2022-08-20 NOTE — Telephone Encounter (Signed)
Pt told me on last video visit she had a hospital bed? Maybe she meant she was getting one? If this is the case we will have to order once. Can we please just verify with patient.

## 2022-08-20 NOTE — Telephone Encounter (Signed)
Warfarin 5 mg Prescription sent to pharmacy, take daily. Needs home INR and needs to check Monday or Tuesday.   Do not take this with the Eliquis.   Jannifer Rodney, FNP

## 2022-08-20 NOTE — Telephone Encounter (Signed)
Wants samples of Eliquis °

## 2022-08-20 NOTE — Telephone Encounter (Signed)
Prescription sent to pharmacy.

## 2022-08-21 LAB — SPECIMEN STATUS REPORT

## 2022-08-24 ENCOUNTER — Telehealth: Payer: Self-pay | Admitting: Cardiovascular Disease

## 2022-08-24 ENCOUNTER — Telehealth: Payer: Self-pay | Admitting: Family

## 2022-08-24 MED ORDER — CLOPIDOGREL BISULFATE 75 MG PO TABS: 75.0000 mg | ORAL_TABLET | Freq: Every day | ORAL | 1 refills | Status: DC

## 2022-08-24 NOTE — Telephone Encounter (Signed)
*  STAT* If patient is at the pharmacy, call can be transferred to refill team.   1. Which medications need to be refilled? (please list name of each medication and dose if known)   clopidogrel (PLAVIX) 75 MG tablet    2. Which pharmacy/location (including street and city if local pharmacy) is medication to be sent to? [facility] [address], [place] - [address]   3. Do they need a 30 day or 90 day supply? 90 days

## 2022-08-24 NOTE — Telephone Encounter (Signed)
Patient aware to call cardiologist for this

## 2022-08-24 NOTE — Telephone Encounter (Signed)
  Prescription Request  08/24/2022  Is this a "Controlled Substance" medicine? no  Have you seen your PCP in the last 2 weeks? No pt has appt on 10/21/22  If YES, route message to pool  -  If NO, patient needs to be scheduled for appointment.  What is the name of the medication or equipment? clopidogrel (PLAVIX) 75 MG tablet   Have you contacted your pharmacy to request a refill? yes   Which pharmacy would you like this sent to? [facility]    Patient notified that their request is being sent to the clinical staff for review and that they should receive a response within 2 business days.

## 2022-08-24 NOTE — Telephone Encounter (Signed)
Pt's medication was sent to pt's pharmacy as requested. Confirmation received.  °

## 2022-08-24 NOTE — Progress Notes (Signed)
  Chronic Care Management   Note  08/24/2022 Name: MESA MEHRHOFF MRN: [id number] DOB: 1945-11-14  TRENEE MATTE is a 77 y.o. year old female who is a primary care patient of Junie Spencer, FNP. I reached out to [name] by phone today in response to a referral sent by Ms. Teresa Pelton Viscardi's PCP.  Ms. Atha was given information about Chronic Care Management services today including:  CCM service includes personalized support from designated clinical staff supervised by the physician, including individualized plan of care and coordination with other care providers 24/7 contact phone numbers for assistance for urgent and routine care needs. Service will only be billed when office clinical staff spend 20 minutes or more in a month to coordinate care. Only one practitioner may furnish and bill the service in a calendar month. The patient may stop CCM services at amy time (effective at the end of the month) by phone call to the office staff. The patient will be responsible for cost sharing (co-pay) or up to 20% of the service fee (after annual deductible is met)  Ms. IVELIZ KNOUSE  agreedto scheduling an appointment with the CCM RN Case Manager   Follow up plan: Patient agreed to scheduled appointment with RN Case Manager on 08/27/2022(date/time).   Penne Lash, RMA Care Guide [facility]  [place], [place] [address] Direct Dial: [phone] [email or web address]

## 2022-08-25 ENCOUNTER — Telehealth: Payer: Self-pay | Admitting: Family

## 2022-08-25 DIAGNOSIS — I110 Hypertensive heart disease with heart failure: Secondary | ICD-10-CM | POA: Diagnosis not present

## 2022-08-25 DIAGNOSIS — D72829 Elevated white blood cell count, unspecified: Secondary | ICD-10-CM | POA: Diagnosis not present

## 2022-08-25 DIAGNOSIS — I679 Cerebrovascular disease, unspecified: Secondary | ICD-10-CM | POA: Diagnosis not present

## 2022-08-25 DIAGNOSIS — J449 Chronic obstructive pulmonary disease, unspecified: Secondary | ICD-10-CM | POA: Diagnosis not present

## 2022-08-25 DIAGNOSIS — I739 Peripheral vascular disease, unspecified: Secondary | ICD-10-CM | POA: Diagnosis not present

## 2022-08-25 DIAGNOSIS — I5032 Chronic diastolic (congestive) heart failure: Secondary | ICD-10-CM | POA: Diagnosis not present

## 2022-08-26 ENCOUNTER — Telehealth: Payer: Self-pay | Admitting: Cardiovascular Disease

## 2022-08-26 DIAGNOSIS — D72829 Elevated white blood cell count, unspecified: Secondary | ICD-10-CM | POA: Diagnosis not present

## 2022-08-26 DIAGNOSIS — I739 Peripheral vascular disease, unspecified: Secondary | ICD-10-CM | POA: Diagnosis not present

## 2022-08-26 DIAGNOSIS — I679 Cerebrovascular disease, unspecified: Secondary | ICD-10-CM | POA: Diagnosis not present

## 2022-08-26 DIAGNOSIS — I5032 Chronic diastolic (congestive) heart failure: Secondary | ICD-10-CM | POA: Diagnosis not present

## 2022-08-26 DIAGNOSIS — J449 Chronic obstructive pulmonary disease, unspecified: Secondary | ICD-10-CM | POA: Diagnosis not present

## 2022-08-26 DIAGNOSIS — I110 Hypertensive heart disease with heart failure: Secondary | ICD-10-CM | POA: Diagnosis not present

## 2022-08-26 NOTE — Telephone Encounter (Signed)
Spoke to the patient, she was advised by her PCP to d/c'd Plavix and Eliquis , will soon start taking Coumadin. However, I do not see any notes advising patient to d/c'd Plavix. Pt stated she ran out of the medication 2 weeks ago. This  medication was refilled on 08/24/22 . Currently, the pt stated she is not taking Plavix and Eliquis.    Pt had OV visit on 07/06/22 with neurology and was advised:   Keep appointment with vascular next week  Keep BP < 130/90, LDL < 70, A1C < 7.0 for stroke prevention  We recommended Eliquis for stroke prevention, also on Plavix,  duration to be determined by vascular  Pt had video visit with PCP on 5/10 according to the notes:  PT not able to leave house without EMS transport Continue with Home Health and PT Pt can not afford Eliquis, I have placed referral to Clinical Pharm to help with assistance. If she can not get medication will have to start Warfarin and do home INR. I am hesitant to do this because she is noncompliant Keep follow up with Cardiologists and Neurologists Needs follow up 1 month .  On 5/16 pt had OV with Pharm D at [facility], according to the OV note   She could not afford the Eliquis because it $600. She remains on samples (Eliquis) from PCP.  She does not qualify for Eliquis PAP or Medicaid/LIS extra help.  She will have to go on warfarin (Coumadin) and likely have home INRs drawn (due to inability of leaving her home without help).  Additional 3 weeks of Eliquis samples left up front to bridge warfarin transition/home INRs/etc.   Patient states she has only been taking Eliquis once daily.  Instructed patient to take Elqiuis twice daily and continue all other medications as prescribed.  Patient has a total of 4 weeks of Eliquis at home.       Patient is seeking clarification on which medication which should she continue to take. Will forward to Cardiologist, neurologist, Vasc-surg and PCP for advise.

## 2022-08-26 NOTE — Telephone Encounter (Signed)
Pt c/o medication issue:  1. Name of Medication: clopidogrel (PLAVIX) 75 MG tablet  warfarin (COUMADIN) 5 MG tablet  2. How are you currently taking this medication (dosage and times per day)?   3. Are you having a reaction (difficulty breathing--STAT)? No  4. What is your medication issue?  Pt calling to make provider aware that her PCP has prescribed for her to start taking Coumadin. Pt states that she will stop the Plavix due to this. She would like a callback regarding this matter. Please advise

## 2022-08-26 NOTE — Telephone Encounter (Signed)
As discussed with patient several times. She is to start the warfarin and stop the Eliquis. (She has stated she was out of this medications, but was given 4 weeks of samples.)  She is to continue Plavix unless Vascular or Cardiologists wants her to stop this.   We have order home health to have INR checked at home.   She needs to make an in person follow up with me.

## 2022-08-26 NOTE — Telephone Encounter (Signed)
Lmtcb.

## 2022-08-26 NOTE — Telephone Encounter (Signed)
Please see other progress note.   Jannifer Rodney, FNP

## 2022-08-27 ENCOUNTER — Ambulatory Visit: Payer: Self-pay | Admitting: *Deleted

## 2022-08-27 ENCOUNTER — Telehealth: Payer: Self-pay | Admitting: Family

## 2022-08-27 ENCOUNTER — Ambulatory Visit (INDEPENDENT_AMBULATORY_CARE_PROVIDER_SITE_OTHER): Payer: Medicare Other | Admitting: *Deleted

## 2022-08-27 DIAGNOSIS — I5032 Chronic diastolic (congestive) heart failure: Secondary | ICD-10-CM

## 2022-08-27 DIAGNOSIS — I110 Hypertensive heart disease with heart failure: Secondary | ICD-10-CM

## 2022-08-27 DIAGNOSIS — I5030 Unspecified diastolic (congestive) heart failure: Secondary | ICD-10-CM | POA: Diagnosis not present

## 2022-08-27 DIAGNOSIS — I10 Essential (primary) hypertension: Secondary | ICD-10-CM

## 2022-08-27 NOTE — Chronic Care Management (AMB) (Signed)
Chronic Care Management   CCM RN Visit Note  08/27/2022 Name: Megan [name] MRN: [id number] DOB: December 21, 1945  Subjective: Megan [name] is a 77 y.o. year old female who is a primary care patient of Junie Spencer, FNP. The patient was referred to the Chronic Care Management team for assistance with care management needs subsequent to provider initiation of CCM services and plan of care.    Today's Visit:  Engaged with patient by telephone for follow up visit.        Goals Addressed             This Visit's Progress    CCM (HYPERTENSION) EXPECTED OUTCOME: SELF-MANAGE AND REDUCE SYMPTOMS OF HYPERTENSION       Current Barriers:  Knowledge Deficits related to Hypertension management Care Coordination needs related to   in a patient with Hypertension Chronic Disease Management support and education needs related to Hypertension, diet No Advanced Directives in place- pt declines information Patient reports her son lives with her, pt states she is unable to walk, is able to stand and pivot, has WC, walker, in process of obtaining  hospital bed, has home health RN and PT, has hired help that comes in daily for a few hours to assists,  Patient reports she checks blood pressure daily with today's reading 118/63. Patient reports she has Eliquis on hand but is confused as to whether she is to take Eliquis, plavix or warfarin and is not taking any blood thinner Per follow up with primary care provider pt is to take warfarin and plavix and stop eliquis  Planned Interventions: Evaluation of current treatment plan related to hypertension self management and patient's adherence to plan as established by provider;   Reviewed prescribed diet low sodium diet Counseled on the importance of exercise goals with target of 150 minutes per week Discussed plans with patient for ongoing care management follow up and provided patient with direct contact information for care management team; Advised  patient, providing education and rationale, to monitor blood pressure daily and record, calling PCP for findings outside established parameters;  Advised patient to discuss changes in health status with provider; Provided education on prescribed diet low sodium;  Discussed complications of poorly controlled blood pressure such as heart disease, stroke, circulatory complications, vision complications, kidney impairment, sexual dysfunction;  Screening for signs and symptoms of depression related to chronic disease state;  Assessed social determinant of health barriers;  In basket sent to primary care provider and cardiologist reporting pt has Eliquis on hand and is not sure if she is to take Eliquis, Plavix or Warfarin and is taking neither at present. RN care manager spoke with pt and instructed per primary care provider to take warfarin and plavix and stop eliquis, pt verbalizes understanding  Symptom Management: Take medications as prescribed   Attend all scheduled provider appointments Call pharmacy for medication refills 3-7 days in advance of running out of medications Call provider office for new concerns or questions  Work with the social worker to address care coordination needs and will continue to work with the clinical team to address health care and disease management related needs check blood pressure daily choose a place to take my blood pressure (home, clinic or office, retail store) learn about high blood pressure keep a blood pressure log take blood pressure log to all doctor appointments keep all doctor appointments take medications for blood pressure exactly as prescribed begin an exercise program report new symptoms to your doctor eat  more whole grains, fruits and vegetables, lean meats and healthy fats Look over education in my chart- low sodium diet Your doctor has been notified about which blood thinner you are take, someone will get back with you Per your doctor's  order- take warfarin and plavix and stop eliquis  Follow Up Plan: Telephone follow up appointment with care management team member scheduled for:  09/21/22 at 945 am          Plan:Telephone follow up appointment with care management team member scheduled for:  09/21/22 at 945 am  Irving Shows [facility], BSN RN Case Manager [facility] [phone]

## 2022-08-27 NOTE — Patient Instructions (Signed)
Please call the care guide team at [phone] if you need to cancel or reschedule your appointment.   If you are experiencing a Mental Health or Behavioral Health Crisis or need someone to talk to, please call the Suicide and Crisis Lifeline: 988 call the [place] National Suicide Prevention Lifeline: 267-130-6229 or TTY: 715-397-4494 TTY 234 395 0492) to talk to a trained counselor call 1-800-273-TALK (toll free, 24 hour hotline) go to [facility] [address], [place] [phone]) call the [facility]: [phone] call 911   Following is a copy of the CCM Program Consent:  CCM service includes personalized support from designated clinical staff supervised by the physician, including individualized plan of care and coordination with other care providers 24/7 contact phone numbers for assistance for urgent and routine care needs. Service will only be billed when office clinical staff spend 20 minutes or more in a month to coordinate care. Only one practitioner may furnish and bill the service in a calendar month. The patient may stop CCM services at amy time (effective at the end of the month) by phone call to the office staff. The patient will be responsible for cost sharing (co-pay) or up to 20% of the service fee (after annual deductible is met)  Following is a copy of your full provider care plan:   Goals Addressed             This Visit's Progress    CCM (HYPERTENSION) EXPECTED OUTCOME: SELF-MANAGE AND REDUCE SYMPTOMS OF HYPERTENSION       Current Barriers:  Knowledge Deficits related to Hypertension management Care Coordination needs related to   in a patient with Hypertension Chronic Disease Management support and education needs related to Hypertension, diet No Advanced Directives in place- pt declines information Patient reports her son lives with her, pt states she is unable to walk, is able to stand and pivot,  has WC, walker, in process of obtaining  hospital bed, has home health RN and PT, has hired help that comes in daily for a few hours to assists,  Patient reports she checks blood pressure daily with today's reading 118/63. Patient reports she has Eliquis on hand but is confused as to whether she is to take Eliquis, plavix or warfarin and is not taking any blood thinner Per follow up with primary care provider pt is to take warfarin and plavix and stop eliquis  Planned Interventions: Evaluation of current treatment plan related to hypertension self management and patient's adherence to plan as established by provider;   Reviewed prescribed diet low sodium diet Counseled on the importance of exercise goals with target of 150 minutes per week Discussed plans with patient for ongoing care management follow up and provided patient with direct contact information for care management team; Advised patient, providing education and rationale, to monitor blood pressure daily and record, calling PCP for findings outside established parameters;  Advised patient to discuss changes in health status with provider; Provided education on prescribed diet low sodium;  Discussed complications of poorly controlled blood pressure such as heart disease, stroke, circulatory complications, vision complications, kidney impairment, sexual dysfunction;  Screening for signs and symptoms of depression related to chronic disease state;  Assessed social determinant of health barriers;  In basket sent to primary care provider and cardiologist reporting pt has Eliquis on hand and is not sure if she is to take Eliquis, Plavix or Warfarin and is taking neither at present. RN care manager spoke with pt and  instructed per primary care provider to take warfarin and plavix and stop eliquis, pt verbalizes understanding  Symptom Management: Take medications as prescribed   Attend all scheduled provider appointments Call pharmacy for  medication refills 3-7 days in advance of running out of medications Call provider office for new concerns or questions  Work with the social worker to address care coordination needs and will continue to work with the clinical team to address health care and disease management related needs check blood pressure daily choose a place to take my blood pressure (home, clinic or office, retail store) learn about high blood pressure keep a blood pressure log take blood pressure log to all doctor appointments keep all doctor appointments take medications for blood pressure exactly as prescribed begin an exercise program report new symptoms to your doctor eat more whole grains, fruits and vegetables, lean meats and healthy fats Look over education in my chart- low sodium diet Your doctor has been notified about which blood thinner you are take, someone will get back with you Per your doctor's order- take warfarin and plavix and stop eliquis  Follow Up Plan: Telephone follow up appointment with care management team member scheduled for:  09/21/22 at 945 am          Patient verbalizes understanding of instructions and care plan provided today and agrees to view in MyChart. Active MyChart status and patient understanding of how to access instructions and care plan via MyChart confirmed with patient.  Telephone follow up appointment with care management team member scheduled for:  09/21/22 at 945 am

## 2022-08-27 NOTE — Chronic Care Management (AMB) (Signed)
Chronic Care Management   CCM RN Visit Note  08/27/2022 Name: Megan [name] MRN: [id number] DOB: Jan 15, 1946  Subjective: Megan [name] is a 77 y.o. year old female who is a primary care patient of Junie Spencer, FNP. The patient was referred to the Chronic Care Management team for assistance with care management needs subsequent to provider initiation of CCM services and plan of care.    Today's Visit:  Engaged with patient by telephone for initial visit.     SDOH Interventions Today    Flowsheet Row Most Recent Value  SDOH Interventions   Food Insecurity Interventions Intervention Not Indicated  Housing Interventions Intervention Not Indicated  Transportation Interventions Intervention Not Indicated  Utilities Interventions Intervention Not Indicated  Financial Strain Interventions Intervention Not Indicated  Physical Activity Interventions Patient Declined  Stress Interventions Intervention Not Indicated  Social Connections Interventions Patient Declined         Goals Addressed             This Visit's Progress    CCM (CONGESTIVE HEART FAILURE) EXPECTED OUTCOME: MONITOR, SELF-MANAGE AND REDUCE SYMPTOMS OF CONGESTIVE HEART FAILURE       Current Barriers:  Knowledge Deficits related to Congestive Heart Failure management Chronic Disease Management support and education needs related to CHF, action plan, diet No Advanced Directives in place- pt declines  Patient reports she is not able to stand on scales to weigh daily, pt reports she keeps an eye on whether her legs are swelling or other symptoms such as dyspnea  Planned Interventions: Basic overview and discussion of pathophysiology of Heart Failure reviewed Provided education on low sodium diet Reviewed Heart Failure Action Plan in depth and provided written copy Assessed need for readable accurate scales in home Discussed the importance of keeping all appointments with provider Screening for signs and  symptoms of depression related to chronic disease state  Assessed social determinant of health barriers Pain assessment completed  Symptom Management: Take medications as prescribed   Attend all scheduled provider appointments Call pharmacy for medication refills 3-7 days in advance of running out of medications Call provider office for new concerns or questions  Work with the social worker to address care coordination needs and will continue to work with the clinical team to address health care and disease management related needs call office if I gain more than 2 pounds in one day or 5 pounds in one week use salt in moderation watch for swelling in feet, ankles and legs every day develop a rescue plan eat more whole grains, fruits and vegetables, lean meats and healthy fats track symptoms and what helps feel better or worse dress right for the weather, hot or cold Education in my chart- Heart Failure action plan  Follow Up Plan: Telephone follow up appointment with care management team member scheduled for:  09/21/22 at 945 am       CCM (HYPERTENSION) EXPECTED OUTCOME: SELF-MANAGE AND REDUCE SYMPTOMS OF HYPERTENSION       Current Barriers:  Knowledge Deficits related to Hypertension management Care Coordination needs related to   in a patient with Hypertension Chronic Disease Management support and education needs related to Hypertension, diet No Advanced Directives in place- pt declines information Patient reports her son lives with her, pt states she is unable to walk, is able to stand and pivot, has WC, walker, in process of obtaining  hospital bed, has home health RN and PT, has hired help that comes in daily for a few  hours to assists,  Patient reports she checks blood pressure daily with today's reading 118/63. Patient reports she has Eliquis on hand but is confused as to whether she is to take Eliquis, plavix or warfarin and is not taking any blood thinner  Planned  Interventions: Evaluation of current treatment plan related to hypertension self management and patient's adherence to plan as established by provider;   Reviewed prescribed diet low sodium diet Counseled on the importance of exercise goals with target of 150 minutes per week Discussed plans with patient for ongoing care management follow up and provided patient with direct contact information for care management team; Advised patient, providing education and rationale, to monitor blood pressure daily and record, calling PCP for findings outside established parameters;  Advised patient to discuss changes in health status with provider; Provided education on prescribed diet low sodium;  Discussed complications of poorly controlled blood pressure such as heart disease, stroke, circulatory complications, vision complications, kidney impairment, sexual dysfunction;  Screening for signs and symptoms of depression related to chronic disease state;  Assessed social determinant of health barriers;  In basket sent to primary care provider and cardiologist reporting pt has Eliquis on hand and is not sure if she is to take Eliquis, Plavix or Warfarin and is taking neither at present.  Symptom Management: Take medications as prescribed   Attend all scheduled provider appointments Call pharmacy for medication refills 3-7 days in advance of running out of medications Call provider office for new concerns or questions  Work with the social worker to address care coordination needs and will continue to work with the clinical team to address health care and disease management related needs check blood pressure daily choose a place to take my blood pressure (home, clinic or office, retail store) learn about high blood pressure keep a blood pressure log take blood pressure log to all doctor appointments keep all doctor appointments take medications for blood pressure exactly as prescribed begin an exercise  program report new symptoms to your doctor eat more whole grains, fruits and vegetables, lean meats and healthy fats Look over education in my chart- low sodium diet Your doctor has been notified about which blood thinner you are take, someone will get back with you  Follow Up Plan: Telephone follow up appointment with care management team member scheduled for:  09/21/22 at 945 am          Plan:Telephone follow up appointment with care management team member scheduled for:  09/21/22 at 945 am  Irving Shows [facility], BSN RN Case Manager [facility] [phone]

## 2022-08-27 NOTE — Plan of Care (Signed)
Chronic Care Management Provider Comprehensive Care Plan    08/27/2022 Name: Megan [name] MRN: [id number] DOB: 23-Jun-1945  Referral to Chronic Care Management (CCM) services was placed by Provider:  Jannifer Rodney FNP on Date: 08/09/22.  Chronic Condition 1: HYPERTENSION Provider Assessment and Plan  HYPERTENSION, BENIGN - CMP14+EGFR; Future - CBC with Differential/Platelet; Future   Expected Outcome/Goals Addressed This Visit (Provider CCM goals/Provider Assessment and plan  CCM (HYPERTENSION) EXPECTED OUTCOME: SELF-MANAGE AND REDUCE SYMPTOMS OF HYPERTENSION  Symptom Management Condition 1: Take medications as prescribed   Attend all scheduled provider appointments Call pharmacy for medication refills 3-7 days in advance of running out of medications Call provider office for new concerns or questions  Work with the social worker to address care coordination needs and will continue to work with the clinical team to address health care and disease management related needs check blood pressure daily choose a place to take my blood pressure (home, clinic or office, retail store) learn about high blood pressure keep a blood pressure log take blood pressure log to all doctor appointments keep all doctor appointments take medications for blood pressure exactly as prescribed begin an exercise program report new symptoms to your doctor eat more whole grains, fruits and vegetables, lean meats and healthy fats Look over education in my chart- low sodium diet Your doctor has been notified about which blood thinner you are take, someone will get back with you  Chronic Condition 2: CONGESTIVE HEART FAILURE Provider Assessment and Plan Chronic diastolic congestive heart failure (HCC) - CMP14+EGFR; Future - CBC with Differential/Platelet; Future   Expected Outcome/Goals Addressed This Visit (Provider CCM goals/Provider Assessment and plan  CCM (CONGESTIVE HEART FAILURE) EXPECTED OUTCOME:  MONITOR, SELF-MANAGE AND REDUCE SYMPTOMS OF CONGESTIVE HEART FAILURE  Symptom Management Condition 2: Take medications as prescribed   Attend all scheduled provider appointments Call pharmacy for medication refills 3-7 days in advance of running out of medications Call provider office for new concerns or questions  Work with the social worker to address care coordination needs and will continue to work with the clinical team to address health care and disease management related needs call office if I gain more than 2 pounds in one day or 5 pounds in one week use salt in moderation watch for swelling in feet, ankles and legs every day develop a rescue plan eat more whole grains, fruits and vegetables, lean meats and healthy fats track symptoms and what helps feel better or worse dress right for the weather, hot or cold Education in my chart- Heart Failure action plan  Problem List Patient Active Problem List   Diagnosis Date Noted   Leg pain 06/01/2022   Traumatic subdural hemorrhage, initial encounter (HCC) 08/16/2021   DNR (do not resuscitate) 06/12/2021   Possible Lytic bone lesions on CT 06/12/2021   Hyperglycemia 05/18/2021   Chronic congestive heart failure (HCC) 08/24/2019   Intertrigo 05/11/2017   OAB (overactive bladder) 02/22/2017   Lymphedema 01/26/2017   Gastroesophageal reflux disease without esophagitis 01/26/2017   Acquired hypothyroidism 01/26/2017   Mixed hyperlipidemia 01/26/2017   Generalized weakness 08/17/2015   Leukocytosis 08/17/2015   Pyrexia    Sleeps in sitting position due to orthopnea 07/31/2015   Morbid obesity due to excess calories (HCC) 06/09/2015   PVD (peripheral vascular disease) (HCC) 06/09/2015   Long term (current) use of anticoagulants 06/19/2010   Tachycardia-bradycardia syndrome (HCC) 11/18/2008   PPM-[facility]ude 11/18/2008   HYPERTENSION, BENIGN 02/21/2008    Medication Management  Current  Outpatient Medications:    albuterol  (VENTOLIN HFA) 108 (90 Base) MCG/ACT inhaler, INHALE 2 PUFFS BY MOUTH EVERY 6 HOURS AS NEEDED FOR WHEEZING OR SHORTNESS OF BREATH Strength 108 (90 Base) MCG/ACT, Disp: 18 g, Rfl: 0   blood glucose meter kit and supplies, Dispense based on patient and insurance preference. Use up to four times daily as directed. (FOR ICD-10 E10.9, E11.9)., Disp: 1 each, Rfl: 0   Calcium Carbonate 500 MG CHEW, Chew 1 tablet (500 mg total) by mouth 2 (two) times daily., Disp: 180 tablet, Rfl: 1   clotrimazole-betamethasone (LOTRISONE) cream, Apply 1 Application topically daily., Disp: 60 g, Rfl: 0   diltiazem (CARDIZEM CD) 240 MG 24 hr capsule, Take 1 capsule (240 mg total) by mouth daily., Disp: 90 capsule, Rfl: 2   ipratropium-albuterol (DUONEB) 0.5-2.5 (3) MG/3ML SOLN, Take 3 mLs by nebulization in the morning and at bedtime. (Patient taking differently: Take 3 mLs by nebulization every 4 (four) hours as needed (Sheezing and shortness of breath).), Disp: 360 mL, Rfl: 0   levothyroxine (SYNTHROID) 125 MCG tablet, Take 1 tablet (125 mcg total) by mouth daily., Disp: 90 tablet, Rfl: 1   metoprolol tartrate (LOPRESSOR) 100 MG tablet, Take 1 tablet by mouth twice daily (Patient taking differently: Take 25 mg by mouth daily as needed (high blood pressure).), Disp: 60 tablet, Rfl: 2   nystatin cream (MYCOSTATIN), Apply 1 Application topically daily as needed (yeast)., Disp: , Rfl:    ondansetron (ZOFRAN) 4 MG tablet, Take 1 tablet (4 mg total) by mouth every 8 (eight) hours as needed for nausea or vomiting., Disp: 90 tablet, Rfl: 1   pantoprazole (PROTONIX) 20 MG tablet, Take 1 tablet (20 mg total) by mouth daily., Disp: 90 tablet, Rfl: 1   Vitamin D, Ergocalciferol, (DRISDOL) 1.25 MG (50000 UNIT) CAPS capsule, Take 1 capsule by mouth once a week, Disp: 12 capsule, Rfl: 1   atorvastatin (LIPITOR) 80 MG tablet, Take 1 tablet by mouth once daily (Patient not taking: Reported on 08/06/2022), Disp: 90 tablet, Rfl: 1   clopidogrel  (PLAVIX) 75 MG tablet, Take 1 tablet (75 mg total) by mouth daily. (Patient not taking: Reported on 08/27/2022), Disp: 90 tablet, Rfl: 1   furosemide (LASIX) 20 MG tablet, Take 1 tablet (20 mg total) by mouth daily. Follow volume status, repeat labs (Patient not taking: Reported on 08/06/2022), Disp: 30 tablet, Rfl: 0   warfarin (COUMADIN) 5 MG tablet, Take 1 tablet (5 mg total) by mouth daily. (Patient not taking: Reported on 08/27/2022), Disp: 30 tablet, Rfl: 0  Cognitive Assessment Identity Confirmed: : Name; DOB Cognitive Status: Normal Other:  : N/A   Functional Assessment Hearing Difficulty or Deaf: no Wear Glasses or Blind: yes Vision Management: reading glasses Concentrating, Remembering or Making Decisions Difficulty (CP): no Difficulty Communicating: no Difficulty Eating/Swallowing: no Walking or Climbing Stairs Difficulty: yes Walking or Climbing Stairs: transferring difficulty, requires equipment Mobility Management: able to stand and pivot but not able to walk, has WC, walker, shower chair, and in process of obtaining hospital bed Dressing/Bathing Difficulty: yes Dressing/Bathing: bathing difficulty, dependent Dressing/Bathing Management: aide assists, pt states she does not get in the shower Doing Errands Independently Difficulty (such as shopping) (CP): yes Errands Management: family/ aide assists   Caregiver Assessment  Primary Source of Support/Comfort: child(ren) Name of Support/Comfort Primary Source: son Kryslynn Luckenbill People in Home: child(ren), adult   Planned Interventions  Evaluation of current treatment plan related to hypertension self management and patient's adherence to plan  as established by provider;   Reviewed prescribed diet low sodium diet Counseled on the importance of exercise goals with target of 150 minutes per week Discussed plans with patient for ongoing care management follow up and provided patient with direct contact information for care  management team; Advised patient, providing education and rationale, to monitor blood pressure daily and record, calling PCP for findings outside established parameters;  Advised patient to discuss changes in health status with provider; Provided education on prescribed diet low sodium;  Discussed complications of poorly controlled blood pressure such as heart disease, stroke, circulatory complications, vision complications, kidney impairment, sexual dysfunction;  Screening for signs and symptoms of depression related to chronic disease state;  Assessed social determinant of health barriers;  In basket sent to primary care provider and cardiologist reporting pt has Eliquis on hand and is not sure if she is to take Eliquis, Plavix or Warfarin and is taking neither at present. Basic overview and discussion of pathophysiology of Heart Failure reviewed Provided education on low sodium diet Reviewed Heart Failure Action Plan in depth and provided written copy Assessed need for readable accurate scales in home Discussed the importance of keeping all appointments with provider Screening for signs and symptoms of depression related to chronic disease state  Assessed social determinant of health barriers Pain assessment completed  Interaction and coordination with outside resources, practitioners, and providers See CCM Referral  Care Plan: Available in MyChart

## 2022-08-27 NOTE — Patient Instructions (Signed)
Please call the care guide team at [phone] if you need to cancel or reschedule your appointment.   If you are experiencing a Mental Health or Behavioral Health Crisis or need someone to talk to, please call 911   Following is a copy of the CCM Program Consent:  CCM service includes personalized support from designated clinical staff supervised by the physician, including individualized plan of care and coordination with other care providers 24/7 contact phone numbers for assistance for urgent and routine care needs. Service will only be billed when office clinical staff spend 20 minutes or more in a month to coordinate care. Only one practitioner may furnish and bill the service in a calendar month. The patient may stop CCM services at amy time (effective at the end of the month) by phone call to the office staff. The patient will be responsible for cost sharing (co-pay) or up to 20% of the service fee (after annual deductible is met)  Following is a copy of your full provider care plan:   Goals Addressed             This Visit's Progress    CCM (CONGESTIVE HEART FAILURE) EXPECTED OUTCOME: MONITOR, SELF-MANAGE AND REDUCE SYMPTOMS OF CONGESTIVE HEART FAILURE       Current Barriers:  Knowledge Deficits related to Congestive Heart Failure management Chronic Disease Management support and education needs related to CHF, action plan, diet No Advanced Directives in place- pt declines  Patient reports she is not able to stand on scales to weigh daily, pt reports she keeps an eye on whether her legs are swelling or other symptoms such as dyspnea  Planned Interventions: Basic overview and discussion of pathophysiology of Heart Failure reviewed Provided education on low sodium diet Reviewed Heart Failure Action Plan in depth and provided written copy Assessed need for readable accurate scales in home Discussed the importance of keeping all appointments with provider Screening for signs  and symptoms of depression related to chronic disease state  Assessed social determinant of health barriers Pain assessment completed  Symptom Management: Take medications as prescribed   Attend all scheduled provider appointments Call pharmacy for medication refills 3-7 days in advance of running out of medications Call provider office for new concerns or questions  Work with the social worker to address care coordination needs and will continue to work with the clinical team to address health care and disease management related needs call office if I gain more than 2 pounds in one day or 5 pounds in one week use salt in moderation watch for swelling in feet, ankles and legs every day develop a rescue plan eat more whole grains, fruits and vegetables, lean meats and healthy fats track symptoms and what helps feel better or worse dress right for the weather, hot or cold Education in my chart- Heart Failure action plan  Follow Up Plan: Telephone follow up appointment with care management team member scheduled for:  09/21/22 at 945 am       CCM (HYPERTENSION) EXPECTED OUTCOME: SELF-MANAGE AND REDUCE SYMPTOMS OF HYPERTENSION       Current Barriers:  Knowledge Deficits related to Hypertension management Care Coordination needs related to   in a patient with Hypertension Chronic Disease Management support and education needs related to Hypertension, diet No Advanced Directives in place- pt declines information Patient reports her son lives with her, pt states she is unable to walk, is able to stand and pivot, has WC, walker, in process of obtaining  hospital bed, has home health RN and PT, has hired help that comes in daily for a few hours to assists,  Patient reports she checks blood pressure daily with today's reading 118/63. Patient reports she has Eliquis on hand but is confused as to whether she is to take Eliquis, plavix or warfarin and is not taking any blood thinner  Planned  Interventions: Evaluation of current treatment plan related to hypertension self management and patient's adherence to plan as established by provider;   Reviewed prescribed diet low sodium diet Counseled on the importance of exercise goals with target of 150 minutes per week Discussed plans with patient for ongoing care management follow up and provided patient with direct contact information for care management team; Advised patient, providing education and rationale, to monitor blood pressure daily and record, calling PCP for findings outside established parameters;  Advised patient to discuss changes in health status with provider; Provided education on prescribed diet low sodium;  Discussed complications of poorly controlled blood pressure such as heart disease, stroke, circulatory complications, vision complications, kidney impairment, sexual dysfunction;  Screening for signs and symptoms of depression related to chronic disease state;  Assessed social determinant of health barriers;  In basket sent to primary care provider and cardiologist reporting pt has Eliquis on hand and is not sure if she is to take Eliquis, Plavix or Warfarin and is taking neither at present.  Symptom Management: Take medications as prescribed   Attend all scheduled provider appointments Call pharmacy for medication refills 3-7 days in advance of running out of medications Call provider office for new concerns or questions  Work with the social worker to address care coordination needs and will continue to work with the clinical team to address health care and disease management related needs check blood pressure daily choose a place to take my blood pressure (home, clinic or office, retail store) learn about high blood pressure keep a blood pressure log take blood pressure log to all doctor appointments keep all doctor appointments take medications for blood pressure exactly as prescribed begin an exercise  program report new symptoms to your doctor eat more whole grains, fruits and vegetables, lean meats and healthy fats Look over education in my chart- low sodium diet Your doctor has been notified about which blood thinner you are take, someone will get back with you  Follow Up Plan: Telephone follow up appointment with care management team member scheduled for:  09/21/22 at 945 am          Patient verbalizes understanding of instructions and care plan provided today and agrees to view in MyChart. Active MyChart status and patient understanding of how to access instructions and care plan via MyChart confirmed with patient.  Telephone follow up appointment with care management team member scheduled for:  09/21/22 at 945 am  Heart Failure Action Plan A heart failure action plan helps you understand what to do when you have symptoms of heart failure. Your action plan is a color-coded plan that lists the symptoms to watch for and indicates what actions to take. If you have symptoms in the red zone, you need medical care right away. If you have symptoms in the yellow zone, you are having problems. If you have symptoms in the green zone, you are doing well. Follow the plan that was created by you and your health care provider. Review your plan each time you visit your health care provider. Red zone These signs and symptoms mean you should get  medical help right away: You have trouble breathing when resting. You have a dry cough that is getting worse. You have swelling or pain in your legs or abdomen that is getting worse. You suddenly gain more than 2-3 lb (0.9-1.4 kg) in 24 hours, or more than 5 lb (2.3 kg) in a week. This amount may be more or less depending on your condition. You have trouble staying awake or you feel confused. You have chest pain. You do not have an appetite. You pass out. You have worsening sadness or depression. If you have any of these symptoms, call your local  emergency services (911 in the [place] right away. Do not drive yourself to the hospital. Yellow zone These signs and symptoms mean your condition may be getting worse and you should make some changes: You have trouble breathing when you are active, or you need to sleep with your head raised on extra pillows to help you breathe. You have swelling in your legs or abdomen. You gain 2-3 lb (0.9-1.4 kg) in 24 hours, or 5 lb (2.3 kg) in a week. This amount may be more or less depending on your condition. You get tired easily. You have trouble sleeping. You have a dry cough. If you have any of these symptoms: Contact your health care provider within the next day. Your health care provider may adjust your medicines. Green zone These signs mean you are doing well and can continue what you are doing: You do not have shortness of breath. You have very little swelling or no new swelling. Your weight is stable (no gain or loss). You have a normal activity level. You do not have chest pain or any other new symptoms. Follow these instructions at home: Take over-the-counter and prescription medicines only as told by your health care provider. Weigh yourself daily. Your target weight is __________ lb (__________ kg). Call your health care provider if you gain more than __________ lb (__________ kg) in 24 hours, or more than __________ lb (__________ kg) in a week. Health care provider name: _____________________________________________________ Health care provider phone number: _____________________________________________________ Eat a heart-healthy diet. Work with a diet and nutrition specialist (dietitian) to create an eating plan that is best for you. Keep all follow-up visits. This is important. Where to find more information American Heart Association: Summary A heart failure action plan helps you understand what to do when you have symptoms of heart failure. Follow the action plan that was  created by you and your health care provider. Get help right away if you have any symptoms in the red zone. This information is not intended to replace advice given to you by your health care provider. Make sure you discuss any questions you have with your health care provider. Document Revised: 06/23/2021 Document Reviewed: 10/29/2019 Elsevier Patient Education  2024 Elsevier Inc. Low-Sodium Eating Plan Salt (sodium) helps you keep a healthy balance of fluids in your body. Too much sodium can raise your blood pressure. It can also cause fluid and waste to be held in your body. Your health care provider or dietitian may recommend a low-sodium eating plan if you have high blood pressure (hypertension), kidney disease, liver disease, or heart failure. Eating less sodium can help lower your blood pressure and reduce swelling. It can also protect your heart, liver, and kidneys. What are tips for following this plan? Reading food labels  Check food labels for the amount of sodium per serving. If you eat more than one serving, you must  multiply the listed amount by the number of servings. Choose foods with less than 140 milligrams (mg) of sodium per serving. Avoid foods with 300 mg of sodium or more per serving. Always check how much sodium is in a product, even if the label says "unsalted" or "no salt added." Shopping  Buy products labeled as "low-sodium" or "no salt added." Buy fresh foods. Avoid canned foods and pre-made or frozen meals. Avoid canned, cured, or processed meats. Buy breads that have less than 80 mg of sodium per slice. Cooking  Eat more home-cooked food. Try to eat less restaurant, buffet, and fast food. Try not to add salt when you cook. Use salt-free seasonings or herbs instead of table salt or sea salt. Check with your provider or pharmacist before using salt substitutes. Cook with plant-based oils, such as canola, sunflower, or olive oil. Meal planning When eating at a  restaurant, ask if your food can be made with less salt or no salt. Avoid dishes labeled as brined, pickled, cured, or smoked. Avoid dishes made with soy sauce, miso, or teriyaki sauce. Avoid foods that have monosodium glutamate (MSG) in them. MSG may be added to some restaurant food, sauces, soups, bouillon, and canned foods. Make meals that can be grilled, baked, poached, roasted, or steamed. These are often made with less sodium. General information Try to limit your sodium intake to 1,500-2,300 mg each day, or the amount told by your provider. What foods should I eat? Fruits Fresh, frozen, or canned fruit. Fruit juice. Vegetables Fresh or frozen vegetables. "No salt added" canned vegetables. "No salt added" tomato sauce and paste. Low-sodium or reduced-sodium tomato and vegetable juice. Grains Low-sodium cereals, such as oats, puffed wheat and rice, and shredded wheat. Low-sodium crackers. Unsalted rice. Unsalted pasta. Low-sodium bread. Whole grain breads and whole grain pasta. Meats and other proteins Fresh or frozen meat, poultry, seafood, and fish. These should have no added salt. Low-sodium canned tuna and salmon. Unsalted nuts. Dried peas, beans, and lentils without added salt. Unsalted canned beans. Eggs. Unsalted nut butters. Dairy Milk. Soy milk. Cheese that is naturally low in sodium, such as ricotta cheese, fresh mozzarella, or Swiss cheese. Low-sodium or reduced-sodium cheese. Cream cheese. Yogurt. Seasonings and condiments Fresh and dried herbs and spices. Salt-free seasonings. Low-sodium mustard and ketchup. Sodium-free salad dressing. Sodium-free light mayonnaise. Fresh or refrigerated horseradish. Lemon juice. Vinegar. Other foods Homemade, reduced-sodium, or low-sodium soups. Unsalted popcorn and pretzels. Low-salt or salt-free chips. The items listed above may not be all the foods and drinks you can have. Talk to a dietitian to learn more. What foods should I  avoid? Vegetables Sauerkraut, pickled vegetables, and relishes. Olives. [place] fries. Onion rings. Regular canned vegetables, except low-sodium or reduced-sodium items. Regular canned tomato sauce and paste. Regular tomato and vegetable juice. Frozen vegetables in sauces. Grains Instant hot cereals. Bread stuffing, pancake, and biscuit mixes. Croutons. Seasoned rice or pasta mixes. Noodle soup cups. Boxed or frozen macaroni and cheese. Regular salted crackers. Self-rising flour. Meats and other proteins Meat or fish that is salted, canned, smoked, spiced, or pickled. Precooked or cured meat, such as sausages or meat loaves. Tomasa Blase. Ham. Pepperoni. Hot dogs. Corned beef. Chipped beef. Salt pork. Jerky. Pickled herring, anchovies, and sardines. Regular canned tuna. Salted nuts. Dairy Processed cheese and cheese spreads. Hard cheeses. Cheese curds. Blue cheese. Feta cheese. String cheese. Regular cottage cheese. Buttermilk. Canned milk. Fats and oils Salted butter. Regular margarine. Ghee. Bacon fat. Seasonings and condiments Onion salt, garlic  salt, seasoned salt, table salt, and sea salt. Canned and packaged gravies. Worcestershire sauce. Tartar sauce. Barbecue sauce. Teriyaki sauce. Soy sauce, including reduced-sodium soy sauce. Steak sauce. Fish sauce. Oyster sauce. Cocktail sauce. Horseradish that you find on the shelf. Regular ketchup and mustard. Meat flavorings and tenderizers. Bouillon cubes. Hot sauce. Pre-made or packaged marinades. Pre-made or packaged taco seasonings. Relishes. Regular salad dressings. Salsa. Other foods Salted popcorn and pretzels. Corn chips and puffs. Potato and tortilla chips. Canned or dried soups. Pizza. Frozen entrees and pot pies. The items listed above may not be all the foods and drinks you should avoid. Talk to a dietitian to learn more. This information is not intended to replace advice given to you by your health care provider. Make sure you discuss any  questions you have with your health care provider. Document Revised: 04/01/2022 Document Reviewed: 04/01/2022 Elsevier Patient Education  2024 [employer].

## 2022-08-30 ENCOUNTER — Other Ambulatory Visit: Payer: Self-pay | Admitting: Family

## 2022-08-30 ENCOUNTER — Telehealth: Payer: Self-pay | Admitting: Family

## 2022-08-30 DIAGNOSIS — I739 Peripheral vascular disease, unspecified: Secondary | ICD-10-CM | POA: Diagnosis not present

## 2022-08-30 DIAGNOSIS — D72829 Elevated white blood cell count, unspecified: Secondary | ICD-10-CM | POA: Diagnosis not present

## 2022-08-30 DIAGNOSIS — J449 Chronic obstructive pulmonary disease, unspecified: Secondary | ICD-10-CM | POA: Diagnosis not present

## 2022-08-30 DIAGNOSIS — I5032 Chronic diastolic (congestive) heart failure: Secondary | ICD-10-CM | POA: Diagnosis not present

## 2022-08-30 DIAGNOSIS — I110 Hypertensive heart disease with heart failure: Secondary | ICD-10-CM | POA: Diagnosis not present

## 2022-08-30 DIAGNOSIS — I679 Cerebrovascular disease, unspecified: Secondary | ICD-10-CM | POA: Diagnosis not present

## 2022-08-30 NOTE — Telephone Encounter (Signed)
Please review

## 2022-08-30 NOTE — Telephone Encounter (Signed)
Patient says her bottom hurts and does not know what she needs to do about it. She has went to the bathroom but not a lot, would like to talk to nurse about taking a laxative. Offered appt and she said she was unable to take an appt. Please call back and advise.

## 2022-08-30 NOTE — Telephone Encounter (Signed)
Please schedule patient a video visit for this. Needs more documentation for insurance to pay for this.

## 2022-08-30 NOTE — Telephone Encounter (Signed)
Patient needs a hospital bed due to redness and skin is getting fluffing. Tried to take a picture but can't stand and can't roll in her recliner. Needs a RX sent to [facility] in [place] so it can be delivered to patient house.

## 2022-08-31 ENCOUNTER — Encounter: Payer: Self-pay | Admitting: *Deleted

## 2022-08-31 ENCOUNTER — Ambulatory Visit: Payer: Self-pay | Admitting: *Deleted

## 2022-08-31 DIAGNOSIS — D72829 Elevated white blood cell count, unspecified: Secondary | ICD-10-CM | POA: Diagnosis not present

## 2022-08-31 DIAGNOSIS — I110 Hypertensive heart disease with heart failure: Secondary | ICD-10-CM | POA: Diagnosis not present

## 2022-08-31 DIAGNOSIS — I679 Cerebrovascular disease, unspecified: Secondary | ICD-10-CM | POA: Diagnosis not present

## 2022-08-31 DIAGNOSIS — J449 Chronic obstructive pulmonary disease, unspecified: Secondary | ICD-10-CM | POA: Diagnosis not present

## 2022-08-31 DIAGNOSIS — I739 Peripheral vascular disease, unspecified: Secondary | ICD-10-CM | POA: Diagnosis not present

## 2022-08-31 DIAGNOSIS — I5032 Chronic diastolic (congestive) heart failure: Secondary | ICD-10-CM | POA: Diagnosis not present

## 2022-08-31 NOTE — Patient Instructions (Signed)
Visit Information  Thank you for taking time to visit with me today. Please don't hesitate to contact me if I can be of assistance to you.   Following are the goals we discussed today:   Goals Addressed               This Visit's Progress     Receive Assistance Applying for Medicaid & Personal Care Services. (pt-stated)   On track     Care Coordination Interventions:  Interventions Today    Flowsheet Row Most Recent Value  Chronic Disease   Chronic disease during today's visit Congestive Heart Failure (CHF), Hypertension (HTN), Other  [Morbid Obesity, Inability to Perform Activities of Daily Living Independently, Caregiver Stress, Needs Wheelchair Ramp Installed for Mobility & Safety & Generalized Weakness.]  General Interventions   General Interventions Discussed/Reviewed General Interventions Discussed, General Interventions Reviewed, Annual Eye Exam, Labs, Durable Medical Equipment (DME), [employer], Level of Care, Communication with, Health Screening, Doctor Visits, Vaccines  [Communication with Primary Care Provider]  Labs Kidney Function  [Encouraged]  Vaccines COVID-19, Flu, Pneumonia, RSV, Shingles, Tetanus/Pertussis/Diphtheria  [Encouraged]  Doctor Visits Discussed/Reviewed Doctor Visits Discussed, Specialist, Doctor Visits Reviewed, Annual Wellness Visits, PCP  [Encouraged]  Health Screening Bone Density, Colonoscopy, Mammogram  [Encouraged]  Durable Medical Equipment (DME) Wheelchair  Wheelchair Standard  PCP/Specialist Visits Compliance with follow-up visit  [Encouraged]  Communication with PCP/Specialists, RN  Level of Care Adult Daycare, Personal Care Services, Applications, Assisted Living, Skilled Nursing Facility  [Encouraged]  Applications Medicaid, Personal Care Services  [Encouraged]  Exercise Interventions   Exercise Discussed/Reviewed Exercise Discussed, Assistive device use and maintanence, Exercise Reviewed, Physical Activity  [Encouraged]  Physical  Activity Discussed/Reviewed Physical Activity Discussed, Home Exercise Program (HEP), Physical Activity Reviewed, Types of exercise  [Encouraged]  Education Interventions   Education Provided Provided Therapist, sports, Provided Web-based Education, Provided Education  Provided Verbal Education On Nutrition, Mental Health/Coping with Illness, When to see the doctor, [employer], [employer], Medication, Exercise, Applications, Eye Care  [Encouraged]  Applications Medicaid, Personal Care Services  [Encouraged]  Mental Health Interventions   Mental Health Discussed/Reviewed Mental Health Discussed, Anxiety, Depression, Grief and Loss, Mental Health Reviewed, Coping Strategies, Substance Abuse, Suicide, Other, Crisis  [Domestic Violence]  Nutrition Interventions   Nutrition Discussed/Reviewed Nutrition Discussed, Nutrition Reviewed, Carbohydrate meal planning, Decreasing sugar intake, Decreasing salt, Portion sizes, Decreasing fats, Fluid intake, Adding fruits and vegetables, Increaing proteins  [Encouraged]  Pharmacy Interventions   Pharmacy Dicussed/Reviewed Pharmacy Topics Discussed, Medication Adherence, Affording Medications, Pharmacy Topics Reviewed  [Encouraged]  Safety Interventions   Safety Discussed/Reviewed Safety Discussed, Safety Reviewed, Fall Risk, Home Safety  [Encouraged]  Home Safety Assistive Devices, Need for home safety assessment, Refer for community resources  [Encouraged]  Advanced Directive Interventions   Advanced Directives Discussed/Reviewed Advanced Directives Discussed  [Completion Encouraged]     Active Listening & Reflection Utilized.  Verbalization of Feelings Encouraged.  Emotional Support Provided. Feelings of Frustration Validated. Symptoms of Anxiety Acknowledged. Problem Solving Interventions Indicated. Task-Centered Solutions Employed.   Solution-Focused Strategies Activated. Acceptance & Commitment Therapy Performed. Cognitive Behavioral  Therapy Initiated. Client-Centered Therapy Implemented. Continue to Receive Home Health Skilled Nursing, Physical Therapy, Occupational Therapy & Social Work Services, through [employer] [phone]). Continue to Work with Verlon Au, [facility] Social Worker with [facility] [phone]), to Receive Assistance Obtaining a Hospital Bed & Lift Chair for Home Use. Continue to Follow-Up with Representative from [employer] [facility] (# [phone]), to Check Status of Name  on Waiting List to Receive a Hospital Bed & Lift Chair for Home Use, If Unable to Obtain Assistance from [place], [facility] Social Worker with [facility] [phone]). Continue to Receive Private Pay Caregiver Services, 5 Hours Per Day (9:00 AM - 2:00 PM), 5 Days Per Week (Monday through Friday), to Assist with Activities of Daily Living, Perform Light Housekeeping Duties, Meal Preparation, Grocery Shopping, Medication Administration, Etc. Please Contact the Following List of [employer], Services & Resources in [place], Offering Private Pay In-Home Caregiver Services, in An Effort to Obtain Care & Supervision on Weekends (Saturday & Sunday): ~ In-Home Care & Respite Agencies ~ Home Health Care Agencies ~ Respite Care Agencies & Facilities Please Contact the Following List of [employer], Services & Resources in [facility], Offering Reduced Network engineer, in An Effort to [employer] & Secure Mobility in The Home: ~ [employer] Independent Living Rehabilitation Program - # [phone] ~ [employer] Alternative Program for Disabled Adults, through Aging, Disability & Transit     Services of [place] - # [phone] ~ [facility] - # [phone] Ext. [phone] ~ [employer] Accessibility - # [phone] ~ [employer] - # [phone] CSW Collaboration with Primary Care Provider, Family Nurse Practitioner, Jannifer Rodney with [facility] [phone]), Via Secure Chat Message in Epic, to Request Prescription for Megace & Stool Softener, Per Your Request. Encouraged Direct Contact with CSW [phone]), if You Have Questions, Need Assistance, or If Additional Social Work Needs Are Identified Between Now & Our Next Scheduled Follow-Up Outreach Call.      Our next appointment is by telephone on 09/14/2022 at 1:45 pm.  Please call the care guide team at [phone] if you need to cancel or reschedule your appointment.   If you are experiencing a Mental Health or Behavioral Health Crisis or need someone to talk to, please call the Suicide and Crisis Lifeline: 988 call the [place] National Suicide Prevention Lifeline: (630)352-3929 or TTY: 726-022-0773 TTY (806)392-7704) to talk to a trained counselor call 1-800-273-TALK (toll free, 24 hour hotline) go to [facility] [address], [place] [phone]) call the [facility] Crisis Line: [phone] call 911  Patient verbalizes understanding of instructions and care plan provided today and agrees to view in MyChart. Active MyChart status and patient understanding of how to access instructions and care plan via MyChart confirmed with patient.     Telephone follow up appointment with care management team member scheduled for:  09/14/2022 at 1:45 pm.  Danford Bad, BSW, MSW, LCSW  Licensed Clinical Social Worker  [facility]  Mailing [place]. [address] [place], [place] [address] Physical Address-[address] [place], [place] [address] Toll Free Main # [phone] Fax # [phone] Cell # [phone] [email or web address] [email or web address]

## 2022-08-31 NOTE — Patient Outreach (Signed)
Care Coordination   Follow Up Visit Note   08/31/2022  Name: Megan [name] MRN: [id number] DOB: [birth year]  Megan [name] is a 77 y.o. year old female who sees Lendon Colonel, Edilia Bo, FNP for primary care. I spoke with Megan [name] by phone today.  What matters to the patients health and wellness today?  Receive Assistance Applying for Medicaid & Personal Care Services.   Goals Addressed               This Visit's Progress     Receive Assistance Applying for Medicaid & Personal Care Services. (pt-stated)   On track     Care Coordination Interventions:  Interventions Today    Flowsheet Row Most Recent Value  Chronic Disease   Chronic disease during today's visit Congestive Heart Failure (CHF), Hypertension (HTN), Other  [Morbid Obesity, Inability to Perform Activities of Daily Living Independently, Caregiver Stress, Needs Wheelchair Ramp Installed for Mobility & Safety & Generalized Weakness.]  General Interventions   General Interventions Discussed/Reviewed General Interventions Discussed, General Interventions Reviewed, Annual Eye Exam, Labs, Durable Medical Equipment (DME), [employer], Level of Care, Communication with, Health Screening, Doctor Visits, Vaccines  [Communication with Primary Care Provider]  Labs Kidney Function  [Encouraged]  Vaccines COVID-19, Flu, Pneumonia, RSV, Shingles, Tetanus/Pertussis/Diphtheria  [Encouraged]  Doctor Visits Discussed/Reviewed Doctor Visits Discussed, Specialist, Doctor Visits Reviewed, Annual Wellness Visits, PCP  [Encouraged]  Health Screening Bone Density, Colonoscopy, Mammogram  [Encouraged]  Durable Medical Equipment (DME) Wheelchair  Wheelchair Standard  PCP/Specialist Visits Compliance with follow-up visit  [Encouraged]  Communication with PCP/Specialists, RN  Level of Care Adult Daycare, Personal Care Services, Applications, Assisted Living, Skilled Nursing Facility  [Encouraged]  Applications Medicaid, Personal Care  Services  [Encouraged]  Exercise Interventions   Exercise Discussed/Reviewed Exercise Discussed, Assistive device use and maintanence, Exercise Reviewed, Physical Activity  [Encouraged]  Physical Activity Discussed/Reviewed Physical Activity Discussed, Home Exercise Program (HEP), Physical Activity Reviewed, Types of exercise  [Encouraged]  Education Interventions   Education Provided Provided Therapist, sports, Provided Web-based Education, Provided Education  Provided Verbal Education On Nutrition, Mental Health/Coping with Illness, When to see the doctor, [employer], [employer], Medication, Exercise, Applications, Eye Care  [Encouraged]  Applications Medicaid, Personal Care Services  [Encouraged]  Mental Health Interventions   Mental Health Discussed/Reviewed Mental Health Discussed, Anxiety, Depression, Grief and Loss, Mental Health Reviewed, Coping Strategies, Substance Abuse, Suicide, Other, Crisis  [Domestic Violence]  Nutrition Interventions   Nutrition Discussed/Reviewed Nutrition Discussed, Nutrition Reviewed, Carbohydrate meal planning, Decreasing sugar intake, Decreasing salt, Portion sizes, Decreasing fats, Fluid intake, Adding fruits and vegetables, Increaing proteins  [Encouraged]  Pharmacy Interventions   Pharmacy Dicussed/Reviewed Pharmacy Topics Discussed, Medication Adherence, Affording Medications, Pharmacy Topics Reviewed  [Encouraged]  Safety Interventions   Safety Discussed/Reviewed Safety Discussed, Safety Reviewed, Fall Risk, Home Safety  [Encouraged]  Home Safety Assistive Devices, Need for home safety assessment, Refer for community resources  [Encouraged]  Advanced Directive Interventions   Advanced Directives Discussed/Reviewed Advanced Directives Discussed  [Completion Encouraged]     Active Listening & Reflection Utilized.  Verbalization of Feelings Encouraged.  Emotional Support Provided. Feelings of Frustration Validated. Symptoms of Anxiety  Acknowledged. Problem Solving Interventions Indicated. Task-Centered Solutions Employed.   Solution-Focused Strategies Activated. Acceptance & Commitment Therapy Performed. Cognitive Behavioral Therapy Initiated. Client-Centered Therapy Implemented. Continue to Receive Home Health Skilled Nursing, Physical Therapy, Occupational Therapy & Social Work Services, through [employer] [phone]). Continue to Work with Verlon Au, [facility] Social Worker with [facility] [phone]), to Receive Assistance Obtaining a Hospital Bed & Lift Chair for Home Use. Continue to Follow-Up with Representative from [facility] [facility] (# [phone]), to Check Status of Name on Waiting List to Receive a Hospital Bed & Lift Chair for Home Use, If Unable to Obtain Assistance from [place], [facility] Social Worker with [facility] [phone]). Continue to Receive Private Pay Caregiver Services, 5 Hours Per Day (9:00 AM - 2:00 PM), 5 Days Per Week (Monday through Friday), to Assist with Activities of Daily Living, Perform Light Housekeeping Duties, Meal Preparation, Grocery Shopping, Medication Administration, Etc. Please Contact the Following List of [employer], Services & Resources in [place], Offering Private Pay In-Home Caregiver Services, in An Effort to Obtain Care & Supervision on Weekends (Saturday & Sunday): ~ In-Home Care & Respite Agencies ~ Home Health Care Agencies ~ Respite Care Agencies & Facilities Please Contact the Following List of [employer], Services & Resources in [place], Offering Reduced Network engineer, in An Effort to [employer] & Secure Mobility in The Home: ~ [employer] Independent Living Rehabilitation Program - # [phone] ~ [employer] Alternative Program for Disabled Adults, through Aging, Disability & Transit     Services of [place] - # [phone] ~ [facility] - # [phone] Ext. [phone] ~ [employer] Accessibility - # [phone] ~ [employer] - # [phone] CSW Collaboration with Primary Care Provider, Family Nurse Practitioner, Jannifer Rodney with [facility] [phone]), Via Secure Chat Message in Epic, to Request Prescription for Megace & Stool Softener, Per Your Request. Encouraged Direct Contact with CSW [phone]), if You Have Questions, Need Assistance, or If Additional Social Work Needs Are Identified Between Now & Our Next Scheduled Follow-Up Outreach Call.      SDOH assessments and interventions completed:  Yes.  Care Coordination Interventions:  Yes, provided.   Follow up plan: Follow up call scheduled for 09/14/2022 at 1:45 pm.  Encounter Outcome:  Pt. Visit Completed.   Danford Bad, BSW, MSW, LCSW  Licensed Restaurant manager, [employer] [facility]  [employer] [address], [place], [place] [address] Physical Address-[address] [place], [place] [address] Toll Free Main # [phone] Fax # [phone] Cell # [phone] [email or web address]

## 2022-09-01 ENCOUNTER — Telehealth: Payer: Self-pay | Admitting: Cardiovascular Disease

## 2022-09-01 NOTE — Telephone Encounter (Signed)
After reviewing chart, Jannifer Rodney NP at [facility] started pt back on warfarin due to cost of Eliquis.  Pt is checking INR at home and [facility] is managing INR. Will route to Robyne Askew NP.

## 2022-09-01 NOTE — Telephone Encounter (Signed)
Pts coumadin was managed by our [facility] office but last note states she was switched to eliquis, but according to this note, the pt has continued taking coumadin.  Will have our [facility] anti-coag pool take a look at this and further advise the pt if she needs to get in with them or not to check her levels, being she is reporting she is still taking this medication.  Anti-coag pool can resend any additional issues back to our triage pool, if they need to be further addressed with the pt.

## 2022-09-01 NOTE — Telephone Encounter (Signed)
Pt c/o medication issue:  1. Name of Medication:  warfarin (COUMADIN) 5 MG tablet  clopidogrel (PLAVIX) 75 MG tablet   2. How are you currently taking this medication (dosage and times per day)?   3. Are you having a reaction (difficulty breathing--STAT)?   4. What is your medication issue?   Patient called stating she could not remember if she took these medications today and does not want to take too much medication.  Patient wants call back to discuss next steps.

## 2022-09-02 ENCOUNTER — Ambulatory Visit (INDEPENDENT_AMBULATORY_CARE_PROVIDER_SITE_OTHER): Payer: Medicare Other

## 2022-09-02 DIAGNOSIS — J449 Chronic obstructive pulmonary disease, unspecified: Secondary | ICD-10-CM

## 2022-09-02 DIAGNOSIS — D72829 Elevated white blood cell count, unspecified: Secondary | ICD-10-CM | POA: Diagnosis not present

## 2022-09-02 DIAGNOSIS — I739 Peripheral vascular disease, unspecified: Secondary | ICD-10-CM | POA: Diagnosis not present

## 2022-09-02 DIAGNOSIS — L249 Irritant contact dermatitis, unspecified cause: Secondary | ICD-10-CM | POA: Diagnosis not present

## 2022-09-02 DIAGNOSIS — I5032 Chronic diastolic (congestive) heart failure: Secondary | ICD-10-CM | POA: Diagnosis not present

## 2022-09-02 DIAGNOSIS — E119 Type 2 diabetes mellitus without complications: Secondary | ICD-10-CM | POA: Diagnosis not present

## 2022-09-02 DIAGNOSIS — R531 Weakness: Secondary | ICD-10-CM | POA: Diagnosis not present

## 2022-09-02 DIAGNOSIS — I10 Essential (primary) hypertension: Secondary | ICD-10-CM | POA: Diagnosis not present

## 2022-09-02 DIAGNOSIS — Z79899 Other long term (current) drug therapy: Secondary | ICD-10-CM | POA: Diagnosis not present

## 2022-09-02 DIAGNOSIS — F419 Anxiety disorder, unspecified: Secondary | ICD-10-CM

## 2022-09-02 DIAGNOSIS — I4821 Permanent atrial fibrillation: Secondary | ICD-10-CM | POA: Diagnosis not present

## 2022-09-02 DIAGNOSIS — I110 Hypertensive heart disease with heart failure: Secondary | ICD-10-CM

## 2022-09-02 DIAGNOSIS — E785 Hyperlipidemia, unspecified: Secondary | ICD-10-CM | POA: Diagnosis not present

## 2022-09-02 DIAGNOSIS — I679 Cerebrovascular disease, unspecified: Secondary | ICD-10-CM

## 2022-09-02 DIAGNOSIS — L24A0 Irritant contact dermatitis due to friction or contact with body fluids, unspecified: Secondary | ICD-10-CM | POA: Diagnosis not present

## 2022-09-02 DIAGNOSIS — Z79890 Hormone replacement therapy (postmenopausal): Secondary | ICD-10-CM | POA: Diagnosis not present

## 2022-09-02 DIAGNOSIS — E782 Mixed hyperlipidemia: Secondary | ICD-10-CM

## 2022-09-02 DIAGNOSIS — E079 Disorder of thyroid, unspecified: Secondary | ICD-10-CM | POA: Diagnosis not present

## 2022-09-02 DIAGNOSIS — Z87891 Personal history of nicotine dependence: Secondary | ICD-10-CM | POA: Diagnosis not present

## 2022-09-02 DIAGNOSIS — E039 Hypothyroidism, unspecified: Secondary | ICD-10-CM | POA: Diagnosis not present

## 2022-09-02 NOTE — Telephone Encounter (Signed)
    Pt is calling to follow up 

## 2022-09-02 NOTE — Telephone Encounter (Signed)
Patient is following up requesting advisement on medications again.

## 2022-09-03 NOTE — Telephone Encounter (Signed)
Spoke with patient, she was uncertain if she remembered to take her medication 2 days ago or not - patient was asking about getting her INR checked. Warfarin prescribed by C. Hawks NP at [facility], instructed patient to contact that office for further clarification/to make an appt to get her INR checked there. No further questions at this time

## 2022-09-03 NOTE — Telephone Encounter (Signed)
Pt states she has to get her INR checked by [facility] where she lives and they would need an order faxed over to [phone]

## 2022-09-03 NOTE — Telephone Encounter (Signed)
Can we check on INR orders? I believe this was faxed last week.

## 2022-09-03 NOTE — Telephone Encounter (Signed)
TC to [facility] w/ [facility], other lab orders were received but not one for an INR

## 2022-09-03 NOTE — Telephone Encounter (Signed)
Pt c/o medication issue:  1. Name of Medication:  clopidogrel (PLAVIX) 75 MG tablet  warfarin (COUMADIN) 5 MG tablet   2. How are you currently taking this medication (dosage and times per day)?   3. Are you having a reaction (difficulty breathing--STAT)?   4. What is your medication issue?   Patient stated she has not taken any medication today and wants to know next steps regarding these medications.

## 2022-09-06 ENCOUNTER — Encounter: Payer: Self-pay | Admitting: *Deleted

## 2022-09-06 DIAGNOSIS — J449 Chronic obstructive pulmonary disease, unspecified: Secondary | ICD-10-CM | POA: Diagnosis not present

## 2022-09-06 DIAGNOSIS — I679 Cerebrovascular disease, unspecified: Secondary | ICD-10-CM | POA: Diagnosis not present

## 2022-09-06 DIAGNOSIS — I739 Peripheral vascular disease, unspecified: Secondary | ICD-10-CM | POA: Diagnosis not present

## 2022-09-06 DIAGNOSIS — I110 Hypertensive heart disease with heart failure: Secondary | ICD-10-CM | POA: Diagnosis not present

## 2022-09-06 DIAGNOSIS — I5032 Chronic diastolic (congestive) heart failure: Secondary | ICD-10-CM | POA: Diagnosis not present

## 2022-09-06 DIAGNOSIS — D72829 Elevated white blood cell count, unspecified: Secondary | ICD-10-CM | POA: Diagnosis not present

## 2022-09-07 ENCOUNTER — Telehealth: Payer: Self-pay | Admitting: Family

## 2022-09-07 DIAGNOSIS — I110 Hypertensive heart disease with heart failure: Secondary | ICD-10-CM | POA: Diagnosis not present

## 2022-09-07 DIAGNOSIS — I5032 Chronic diastolic (congestive) heart failure: Secondary | ICD-10-CM | POA: Diagnosis not present

## 2022-09-07 DIAGNOSIS — D72829 Elevated white blood cell count, unspecified: Secondary | ICD-10-CM | POA: Diagnosis not present

## 2022-09-07 DIAGNOSIS — I679 Cerebrovascular disease, unspecified: Secondary | ICD-10-CM | POA: Diagnosis not present

## 2022-09-07 DIAGNOSIS — I739 Peripheral vascular disease, unspecified: Secondary | ICD-10-CM | POA: Diagnosis not present

## 2022-09-07 DIAGNOSIS — J449 Chronic obstructive pulmonary disease, unspecified: Secondary | ICD-10-CM | POA: Diagnosis not present

## 2022-09-07 MED ORDER — ONDANSETRON 4 MG PO TBDP: 4.0000 mg | ORAL_TABLET | Freq: Three times a day (TID) | ORAL | 2 refills | Status: DC | PRN

## 2022-09-07 NOTE — Telephone Encounter (Signed)
Zofran Prescription sent to pharmacy  ° °

## 2022-09-07 NOTE — Telephone Encounter (Signed)
lmtcb

## 2022-09-09 NOTE — Telephone Encounter (Signed)
Patient said all of her medications are making her sick, including the Zofran that was called in to help her. She does not know what to do. Please call back and advise.

## 2022-09-09 NOTE — Telephone Encounter (Signed)
Called she states she can not come in. She states she will call back

## 2022-09-09 NOTE — Telephone Encounter (Signed)
Pt needs to be seen in person.   Jannifer Rodney, FNP

## 2022-09-10 DIAGNOSIS — I5032 Chronic diastolic (congestive) heart failure: Secondary | ICD-10-CM | POA: Diagnosis not present

## 2022-09-10 DIAGNOSIS — I110 Hypertensive heart disease with heart failure: Secondary | ICD-10-CM | POA: Diagnosis not present

## 2022-09-10 DIAGNOSIS — D72829 Elevated white blood cell count, unspecified: Secondary | ICD-10-CM | POA: Diagnosis not present

## 2022-09-10 DIAGNOSIS — I679 Cerebrovascular disease, unspecified: Secondary | ICD-10-CM | POA: Diagnosis not present

## 2022-09-10 DIAGNOSIS — J449 Chronic obstructive pulmonary disease, unspecified: Secondary | ICD-10-CM | POA: Diagnosis not present

## 2022-09-10 DIAGNOSIS — I739 Peripheral vascular disease, unspecified: Secondary | ICD-10-CM | POA: Diagnosis not present

## 2022-09-13 ENCOUNTER — Encounter (HOSPITAL_COMMUNITY): Payer: Self-pay

## 2022-09-13 ENCOUNTER — Emergency Department (HOSPITAL_COMMUNITY)
Admission: EM | Admit: 2022-09-13 | Discharge: 2022-09-27 | DRG: 871 | Disposition: E | Payer: Medicare Other | Attending: Emergency Medicine | Admitting: Emergency Medicine

## 2022-09-13 ENCOUNTER — Encounter (HOSPITAL_COMMUNITY): Payer: Self-pay | Admitting: Emergency Medicine

## 2022-09-13 ENCOUNTER — Other Ambulatory Visit: Payer: Self-pay

## 2022-09-13 ENCOUNTER — Inpatient Hospital Stay (HOSPITAL_COMMUNITY): Payer: Medicare Other

## 2022-09-13 ENCOUNTER — Emergency Department (HOSPITAL_COMMUNITY): Payer: Medicare Other

## 2022-09-13 DIAGNOSIS — E872 Acidosis, unspecified: Secondary | ICD-10-CM | POA: Diagnosis present

## 2022-09-13 DIAGNOSIS — R110 Nausea: Secondary | ICD-10-CM | POA: Diagnosis not present

## 2022-09-13 DIAGNOSIS — I6782 Cerebral ischemia: Secondary | ICD-10-CM | POA: Diagnosis not present

## 2022-09-13 DIAGNOSIS — R531 Weakness: Secondary | ICD-10-CM | POA: Diagnosis not present

## 2022-09-13 DIAGNOSIS — Z66 Do not resuscitate: Secondary | ICD-10-CM | POA: Diagnosis not present

## 2022-09-13 DIAGNOSIS — A419 Sepsis, unspecified organism: Principal | ICD-10-CM | POA: Diagnosis present

## 2022-09-13 DIAGNOSIS — K5730 Diverticulosis of large intestine without perforation or abscess without bleeding: Secondary | ICD-10-CM | POA: Diagnosis not present

## 2022-09-13 DIAGNOSIS — R791 Abnormal coagulation profile: Secondary | ICD-10-CM

## 2022-09-13 DIAGNOSIS — J90 Pleural effusion, not elsewhere classified: Secondary | ICD-10-CM | POA: Diagnosis not present

## 2022-09-13 DIAGNOSIS — J9601 Acute respiratory failure with hypoxia: Secondary | ICD-10-CM

## 2022-09-13 DIAGNOSIS — I4891 Unspecified atrial fibrillation: Secondary | ICD-10-CM | POA: Diagnosis not present

## 2022-09-13 DIAGNOSIS — R6521 Severe sepsis with septic shock: Secondary | ICD-10-CM

## 2022-09-13 DIAGNOSIS — I700 Atherosclerosis of aorta: Secondary | ICD-10-CM | POA: Diagnosis not present

## 2022-09-13 DIAGNOSIS — K921 Melena: Secondary | ICD-10-CM | POA: Diagnosis not present

## 2022-09-13 DIAGNOSIS — Z6841 Body Mass Index (BMI) 40.0 and over, adult: Secondary | ICD-10-CM | POA: Diagnosis not present

## 2022-09-13 DIAGNOSIS — I495 Sick sinus syndrome: Secondary | ICD-10-CM | POA: Diagnosis not present

## 2022-09-13 DIAGNOSIS — Z515 Encounter for palliative care: Secondary | ICD-10-CM | POA: Diagnosis not present

## 2022-09-13 DIAGNOSIS — J188 Other pneumonia, unspecified organism: Secondary | ICD-10-CM

## 2022-09-13 DIAGNOSIS — I110 Hypertensive heart disease with heart failure: Secondary | ICD-10-CM | POA: Diagnosis present

## 2022-09-13 DIAGNOSIS — T148XXA Other injury of unspecified body region, initial encounter: Secondary | ICD-10-CM | POA: Diagnosis present

## 2022-09-13 DIAGNOSIS — R627 Adult failure to thrive: Secondary | ICD-10-CM | POA: Diagnosis not present

## 2022-09-13 DIAGNOSIS — E785 Hyperlipidemia, unspecified: Secondary | ICD-10-CM | POA: Diagnosis not present

## 2022-09-13 DIAGNOSIS — R4182 Altered mental status, unspecified: Secondary | ICD-10-CM | POA: Diagnosis not present

## 2022-09-13 DIAGNOSIS — I509 Heart failure, unspecified: Secondary | ICD-10-CM | POA: Diagnosis not present

## 2022-09-13 DIAGNOSIS — Z950 Presence of cardiac pacemaker: Secondary | ICD-10-CM

## 2022-09-13 DIAGNOSIS — Z7902 Long term (current) use of antithrombotics/antiplatelets: Secondary | ICD-10-CM

## 2022-09-13 DIAGNOSIS — J189 Pneumonia, unspecified organism: Secondary | ICD-10-CM | POA: Diagnosis present

## 2022-09-13 DIAGNOSIS — K8050 Calculus of bile duct without cholangitis or cholecystitis without obstruction: Secondary | ICD-10-CM | POA: Diagnosis present

## 2022-09-13 DIAGNOSIS — Z79899 Other long term (current) drug therapy: Secondary | ICD-10-CM

## 2022-09-13 DIAGNOSIS — R918 Other nonspecific abnormal finding of lung field: Secondary | ICD-10-CM | POA: Diagnosis not present

## 2022-09-13 DIAGNOSIS — Z7401 Bed confinement status: Secondary | ICD-10-CM | POA: Diagnosis not present

## 2022-09-13 DIAGNOSIS — K219 Gastro-esophageal reflux disease without esophagitis: Secondary | ICD-10-CM | POA: Diagnosis present

## 2022-09-13 DIAGNOSIS — Z7901 Long term (current) use of anticoagulants: Secondary | ICD-10-CM | POA: Diagnosis not present

## 2022-09-13 DIAGNOSIS — R630 Anorexia: Secondary | ICD-10-CM | POA: Diagnosis present

## 2022-09-13 DIAGNOSIS — R932 Abnormal findings on diagnostic imaging of liver and biliary tract: Secondary | ICD-10-CM | POA: Diagnosis not present

## 2022-09-13 DIAGNOSIS — E662 Morbid (severe) obesity with alveolar hypoventilation: Secondary | ICD-10-CM | POA: Diagnosis present

## 2022-09-13 DIAGNOSIS — Z79890 Hormone replacement therapy (postmenopausal): Secondary | ICD-10-CM

## 2022-09-13 DIAGNOSIS — Z5309 Procedure and treatment not carried out because of other contraindication: Secondary | ICD-10-CM | POA: Diagnosis not present

## 2022-09-13 LAB — CBC WITH DIFFERENTIAL/PLATELET
Abs Immature Granulocytes: 0.6 10*3/uL — ABNORMAL HIGH (ref 0.00–0.07)
Basophils Absolute: 0.1 10*3/uL (ref 0.0–0.1)
Basophils Relative: 0 %
Eosinophils Absolute: 0.5 10*3/uL (ref 0.0–0.5)
Eosinophils Relative: 2 %
HCT: 33.1 % — ABNORMAL LOW (ref 36.0–46.0)
Hemoglobin: 11.5 g/dL — ABNORMAL LOW (ref 12.0–15.0)
Immature Granulocytes: 2 %
Lymphocytes Relative: 4 %
Lymphs Abs: 1 10*3/uL (ref 0.7–4.0)
MCH: 30.9 pg (ref 26.0–34.0)
MCHC: 34.7 g/dL (ref 30.0–36.0)
MCV: 89 fL (ref 80.0–100.0)
Monocytes Absolute: 1.3 10*3/uL — ABNORMAL HIGH (ref 0.1–1.0)
Monocytes Relative: 5 %
Neutro Abs: 22.8 10*3/uL — ABNORMAL HIGH (ref 1.7–7.7)
Neutrophils Relative %: 87 %
Platelets: 299 10*3/uL (ref 150–400)
RBC: 3.72 MIL/uL — ABNORMAL LOW (ref 3.87–5.11)
RDW: 16.2 % — ABNORMAL HIGH (ref 11.5–15.5)
WBC: 26.3 10*3/uL — ABNORMAL HIGH (ref 4.0–10.5)
nRBC: 0.7 % — ABNORMAL HIGH (ref 0.0–0.2)

## 2022-09-13 LAB — BLOOD GAS, VENOUS
Acid-base deficit: 1.1 mmol/L (ref 0.0–2.0)
Bicarbonate: 23.5 mmol/L (ref 20.0–28.0)
Drawn by: 4237
O2 Saturation: 23.3 %
Patient temperature: 36.8
pCO2, Ven: 38 mmHg — ABNORMAL LOW (ref 44–60)
pH, Ven: 7.4 (ref 7.25–7.43)
pO2, Ven: <31 mmHg — CL (ref 32–45)

## 2022-09-13 LAB — COMPREHENSIVE METABOLIC PANEL
ALT: 23 U/L (ref 0–44)
AST: 41 U/L (ref 15–41)
Albumin: 2 g/dL — ABNORMAL LOW (ref 3.5–5.0)
Alkaline Phosphatase: 135 U/L — ABNORMAL HIGH (ref 38–126)
Anion gap: 13 (ref 5–15)
BUN: 20 mg/dL (ref 8–23)
CO2: 22 mmol/L (ref 22–32)
Calcium: 7.5 mg/dL — ABNORMAL LOW (ref 8.9–10.3)
Chloride: 94 mmol/L — ABNORMAL LOW (ref 98–111)
Creatinine, Ser: 0.94 mg/dL (ref 0.44–1.00)
GFR, Estimated: >60 mL/min (ref >60)
Glucose, Bld: 176 mg/dL — ABNORMAL HIGH (ref 70–99)
Potassium: 3.6 mmol/L (ref 3.5–5.1)
Sodium: 129 mmol/L — ABNORMAL LOW (ref 135–145)
Total Bilirubin: 1.3 mg/dL — ABNORMAL HIGH (ref 0.3–1.2)
Total Protein: 4.8 g/dL — ABNORMAL LOW (ref 6.5–8.1)

## 2022-09-13 LAB — TYPE AND SCREEN
ABO/RH(D): A POS
Antibody Screen: NEGATIVE

## 2022-09-13 LAB — LACTIC ACID, PLASMA
Lactic Acid, Venous: 5.2 mmol/L (ref 0.5–1.9)
Lactic Acid, Venous: 5.6 mmol/L (ref 0.5–1.9)

## 2022-09-13 LAB — BRAIN NATRIURETIC PEPTIDE: B Natriuretic Peptide: 159 pg/mL — ABNORMAL HIGH (ref 0.0–100.0)

## 2022-09-13 LAB — TROPONIN I (HIGH SENSITIVITY)
Troponin I (High Sensitivity): 14 ng/L (ref <18)
Troponin I (High Sensitivity): 14 ng/L (ref <18)

## 2022-09-13 LAB — POC OCCULT BLOOD, ED: Fecal Occult Bld: POSITIVE — AB

## 2022-09-13 LAB — PROTIME-INR: INR: >10 (ref 0.8–1.2)

## 2022-09-13 LAB — MAGNESIUM: Magnesium: 1.9 mg/dL (ref 1.7–2.4)

## 2022-09-13 MED ORDER — POLYVINYL ALCOHOL 1.4 % OP SOLN: 1.0000 [drp] | Freq: Four times a day (QID) | OPHTHALMIC | Status: DC | PRN

## 2022-09-13 MED ORDER — FENTANYL CITRATE PF 50 MCG/ML IJ SOSY
50.0000 ug | PREFILLED_SYRINGE | Freq: Once | INTRAMUSCULAR | Status: AC
  Administered 2022-09-13: 50 ug via INTRAVENOUS
  Filled 2022-09-13: qty 1

## 2022-09-13 MED ORDER — VANCOMYCIN HCL IN DEXTROSE 1-5 GM/200ML-% IV SOLN: 1000.0000 mg | Freq: Once | INTRAVENOUS | Status: DC

## 2022-09-13 MED ORDER — GLYCOPYRROLATE 0.2 MG/ML IJ SOLN: 0.2000 mg | INTRAMUSCULAR | Status: DC | PRN

## 2022-09-13 MED ORDER — SODIUM CHLORIDE 0.9 % IV SOLN: INTRAVENOUS | Status: DC

## 2022-09-13 MED ORDER — VITAMIN K1 10 MG/ML IJ SOLN
10.0000 mg | INTRAVENOUS | Status: AC
  Administered 2022-09-13: 10 mg via INTRAVENOUS
  Filled 2022-09-13: qty 1

## 2022-09-13 MED ORDER — NOREPINEPHRINE 4 MG/250ML-% IV SOLN
2.0000 ug/min | INTRAVENOUS | Status: DC
  Administered 2022-09-13: 10 ug/min via INTRAVENOUS

## 2022-09-13 MED ORDER — VASOPRESSIN 20 UNITS/100 ML INFUSION FOR SHOCK
INTRAVENOUS | Status: AC
  Administered 2022-09-13: 20 [IU]
  Filled 2022-09-13: qty 100

## 2022-09-13 MED ORDER — ACETAMINOPHEN 325 MG PO TABS: 650.0000 mg | ORAL_TABLET | Freq: Four times a day (QID) | ORAL | Status: DC | PRN

## 2022-09-13 MED ORDER — ONDANSETRON HCL 4 MG/2ML IJ SOLN
4.0000 mg | Freq: Once | INTRAMUSCULAR | Status: AC
  Administered 2022-09-13: 4 mg via INTRAVENOUS
  Filled 2022-09-13: qty 2

## 2022-09-13 MED ORDER — VASOPRESSIN 20 UNIT/ML IV SOLN: 2.5000 [IU] | Freq: Once | INTRAVENOUS | Status: DC

## 2022-09-13 MED ORDER — SODIUM CHLORIDE 0.9 % IV SOLN: 2.0000 g | Freq: Two times a day (BID) | INTRAVENOUS | Status: DC

## 2022-09-13 MED ORDER — METRONIDAZOLE 500 MG/100ML IV SOLN
500.0000 mg | Freq: Once | INTRAVENOUS | Status: AC
  Administered 2022-09-13: 500 mg via INTRAVENOUS
  Filled 2022-09-13: qty 100

## 2022-09-13 MED ORDER — SODIUM CHLORIDE 0.9 % IV BOLUS (SEPSIS)
500.0000 mL | Freq: Once | INTRAVENOUS | Status: AC
  Administered 2022-09-13: 500 mL via INTRAVENOUS

## 2022-09-13 MED ORDER — SODIUM CHLORIDE 0.9 % IV SOLN: 250.0000 mL | INTRAVENOUS | Status: DC

## 2022-09-13 MED ORDER — NOREPINEPHRINE 4 MG/250ML-% IV SOLN
2.0000 ug/min | INTRAVENOUS | Status: DC
  Administered 2022-09-13: 2 ug/min via INTRAVENOUS
  Filled 2022-09-13: qty 250

## 2022-09-13 MED ORDER — ACETAMINOPHEN 650 MG RE SUPP: 650.0000 mg | Freq: Four times a day (QID) | RECTAL | Status: DC | PRN

## 2022-09-13 MED ORDER — VASOPRESSIN 20 UNITS/100 ML INFUSION FOR SHOCK
0.0000 [IU]/min | INTRAVENOUS | Status: DC
  Administered 2022-09-13: 0.03 [IU]/min via INTRAVENOUS

## 2022-09-13 MED ORDER — SODIUM CHLORIDE 0.9 % IV BOLUS (SEPSIS)
1000.0000 mL | Freq: Once | INTRAVENOUS | Status: AC
  Administered 2022-09-13: 1000 mL via INTRAVENOUS

## 2022-09-13 MED ORDER — GLYCOPYRROLATE 1 MG PO TABS: 1.0000 mg | ORAL_TABLET | ORAL | Status: DC | PRN

## 2022-09-13 MED ORDER — LACTATED RINGERS IV BOLUS (SEPSIS)
500.0000 mL | Freq: Once | INTRAVENOUS | Status: AC
  Administered 2022-09-13: 500 mL via INTRAVENOUS

## 2022-09-13 MED ORDER — VANCOMYCIN HCL 1750 MG/350ML IV SOLN: 1750.0000 mg | INTRAVENOUS | Status: DC

## 2022-09-13 MED ORDER — PROTHROMBIN COMPLEX CONC HUMAN 500 UNITS IV KIT
4848.0000 [IU] | PACK | Status: AC
  Administered 2022-09-13: 4848 [IU] via INTRAVENOUS
  Filled 2022-09-13: qty 4848

## 2022-09-13 MED ORDER — VITAMIN K1 10 MG/ML IJ SOLN
2.5000 mg | Freq: Once | INTRAVENOUS | Status: DC
  Filled 2022-09-13: qty 0.25

## 2022-09-13 MED ORDER — SODIUM CHLORIDE 0.9 % IV SOLN
2.0000 g | Freq: Once | INTRAVENOUS | Status: AC
  Administered 2022-09-13: 2 g via INTRAVENOUS
  Filled 2022-09-13: qty 12.5

## 2022-09-13 MED ORDER — VANCOMYCIN HCL 2000 MG/400ML IV SOLN
2000.0000 mg | Freq: Once | INTRAVENOUS | Status: AC
  Administered 2022-09-13: 2000 mg via INTRAVENOUS
  Filled 2022-09-13: qty 400

## 2022-09-14 ENCOUNTER — Encounter: Payer: Self-pay | Admitting: *Deleted

## 2022-09-14 ENCOUNTER — Ambulatory Visit: Payer: Self-pay | Admitting: *Deleted

## 2022-09-14 NOTE — Chronic Care Management (AMB) (Signed)
   09/14/2022  ANALECIA YTUARTE Aug 29, 1945 [id number]   Received notification patient deceased 10/12/22, case closed.  Irving Shows RNC, BSN RN Case Manager [facility] [phone]

## 2022-09-16 LAB — CULTURE, BLOOD (ROUTINE X 2)

## 2022-09-17 LAB — CULTURE, BLOOD (ROUTINE X 2)

## 2022-09-18 LAB — CULTURE, BLOOD (ROUTINE X 2): Culture: NO GROWTH

## 2022-09-20 ENCOUNTER — Other Ambulatory Visit: Payer: Self-pay | Admitting: Family

## 2022-09-20 LAB — PROTIME-INR: Prothrombin Time: >90 seconds — ABNORMAL HIGH (ref 11.4–15.2)

## 2022-09-21 ENCOUNTER — Telehealth: Payer: Medicare Other

## 2022-09-27 NOTE — ED Notes (Signed)
Cardiac activity inaudible, RR 4, BP unable to be assessed.

## 2022-09-27 NOTE — Progress Notes (Signed)
   PCCM transfer request    Sending physician: Durwin Nora  Sending facility: [facility]  Reason for transfer: septic shock   Brief case summary: pneumonia with marginal BP, INR 10!, FTT  Recommendations made prior to transfer: [facility], antibiotics  Transfer accepted: yes/no yes    Lynnell Catalan 09/17/2022 4:21 PM [facility]  For contact information, see Amion. If no response to pager, please call PCCM consult pager. After hours, 7PM- 7AM, please call Elink.

## 2022-09-27 NOTE — ED Triage Notes (Signed)
Pt from home via [facility] with reports of nausea, L leg pain, and weakness.

## 2022-09-27 NOTE — ED Notes (Addendum)
Pts status has changed. Pt is hypotensive with systolic in the 60s, unresponsive to verbal stimuli and painful stimuli. Dr. Juanita Craver at bedside for cardioversion. Per Dr.Grey call respiratory to prepare to intubate patient.

## 2022-09-27 NOTE — ED Notes (Signed)
Synchronized shock @ 200J delivered by EDP Dr. Juanita Craver. Pt is now in sinus tachycardia with HR 108. EDP remains at bedside.

## 2022-09-27 NOTE — ED Notes (Signed)
Unable to assess BP via any method. RR at 8 currently.

## 2022-09-27 NOTE — ED Notes (Signed)
Verbal order for pt levo to bumped up to / hr.

## 2022-09-27 NOTE — Progress Notes (Signed)
Pharmacy Antibiotic Note  Megan [name] is a 77 y.o. female admitted on 09/01/2022 with  unknown source of infection .  Pharmacy has been consulted for vancomycin and cefepime dosing.  Plan: Vancomycin 2000 mg IV x 1 dose. Vancomycin 1750 mg IV Every 48 hours. Cefepime 2000 mg IV every 12 hours. Monitor labs, c/s, and vanco levels as indicated.     Temp (24hrs), Avg:98.3 F (36.8 C), Min:98.3 F (36.8 C), Max:98.3 F (36.8 C)  Recent Labs  Lab 08/31/2022 1336 09/14/2022 1417  WBC 26.3*  --   CREATININE 0.94  --   LATICACIDVEN  --  5.2*    CrCl cannot be calculated (Unknown ideal weight.).    No Known Allergies  Antimicrobials this admission: Vanco 6/17 >> Cefepime 6/17 >>  Flagyl 6/17  Microbiology results: 6/17 BCx: pending   Thank you for allowing pharmacy to be a part of this patient's care.  Judeth Cornfield, PharmD Clinical Pharmacist 09/08/2022 3:00 PM

## 2022-09-27 NOTE — ED Notes (Signed)
Time of Death confirmed by Dr. Wallace Cullens at [phone]

## 2022-09-27 NOTE — ED Provider Notes (Signed)
[place] EMERGENCY DEPARTMENT AT [facility] Provider Note   CSN: [id number] Arrival date & time: 09/01/2022  1108     History  Chief Complaint  Patient presents with   Nausea    Megan [name] is a 77 y.o. female.  HPI Patient presents for nausea and weakness for the past week.  Medical history includes HTN, tachycardia-bradycardia syndrome s/p [facility] pacemaker placement, HLD, CHF, atrial fibrillation.  Per chart review, she was previously on Eliquis and Plavix.  She was switched to warfarin last month due to and affordability of Eliquis.  She states that she has been taking her warfarin.  She is reportedly bedbound.  Patient endorses pain in her gluteal area as well as her right entire leg.  She denies any other areas of discomfort.  Patient is a poor historian.  History per son: Patient has been bedbound for the past month.  Over the past several days, she has had poor appetite and nausea.  She has a home health aide during the day.  Son is not aware of any other recent symptoms.    Home Medications Prior to Admission medications   Medication Sig Start Date End Date Taking? Authorizing Provider  albuterol (VENTOLIN HFA) 108 (90 Base) MCG/ACT inhaler INHALE 2 PUFFS BY MOUTH EVERY 6 HOURS AS NEEDED FOR WHEEZING OR SHORTNESS OF BREATH Strength 108 (90 Base) MCG/ACT 05/05/22   Junie Spencer, FNP  atorvastatin (LIPITOR) 80 MG tablet Take 1 tablet by mouth once daily Patient not taking: Reported on 08/06/2022 03/20/21   Junie Spencer, FNP  blood glucose meter kit and supplies Dispense based on patient and insurance preference. Use up to four times daily as directed. (FOR ICD-10 E10.9, E11.9). 06/09/20   Junie Spencer, FNP  Calcium Carbonate 500 MG CHEW Chew 1 tablet (500 mg total) by mouth 2 (two) times daily. 08/20/22   Junie Spencer, FNP  clopidogrel (PLAVIX) 75 MG tablet Take 1 tablet (75 mg total) by mouth daily. Patient not taking: Reported on 08/27/2022  08/24/22   Mealor, Roberts Gaudy, MD  clotrimazole-betamethasone (LOTRISONE) cream Apply 1 Application topically daily. 08/20/22   Junie Spencer, FNP  diltiazem (CARDIZEM CD) 240 MG 24 hr capsule Take 1 capsule (240 mg total) by mouth daily. 01/19/22   Junie Spencer, FNP  furosemide (LASIX) 20 MG tablet Take 1 tablet (20 mg total) by mouth daily. Follow volume status, repeat labs Patient not taking: Reported on 08/06/2022 06/06/22 07/16/22  Zigmund Daniel., MD  ipratropium-albuterol (DUONEB) 0.5-2.5 (3) MG/3ML SOLN Take 3 mLs by nebulization in the morning and at bedtime. Patient taking differently: Take 3 mLs by nebulization every 4 (four) hours as needed (Sheezing and shortness of breath). 08/05/21   Junie Spencer, FNP  levothyroxine (SYNTHROID) 125 MCG tablet Take 1 tablet (125 mcg total) by mouth daily. 08/20/22 08/20/23  Junie Spencer, FNP  metoprolol tartrate (LOPRESSOR) 100 MG tablet Take 1 tablet by mouth twice daily Patient taking differently: Take 25 mg by mouth daily as needed (high blood pressure). 04/13/22   Junie Spencer, FNP  nystatin cream (MYCOSTATIN) Apply 1 Application topically daily as needed (yeast).    [provider]  ondansetron (ZOFRAN) 4 MG tablet Take 1 tablet (4 mg total) by mouth every 8 (eight) hours as needed for nausea or vomiting. 08/06/22   Jannifer Rodney A, FNP  ondansetron (ZOFRAN-ODT) 4 MG disintegrating tablet Take 1 tablet (4 mg total) by mouth every  8 (eight) hours as needed for nausea or vomiting. 09/07/22   Jannifer Rodney A, FNP  pantoprazole (PROTONIX) 20 MG tablet Take 1 tablet (20 mg total) by mouth daily. 08/06/22   Junie Spencer, FNP  Vitamin D, Ergocalciferol, (DRISDOL) 1.25 MG (50000 UNIT) CAPS capsule Take 1 capsule by mouth once a week 01/01/21   Jannifer Rodney A, FNP  warfarin (COUMADIN) 5 MG tablet Take 1 tablet (5 mg total) by mouth daily. Patient not taking: Reported on 08/27/2022 08/20/22   Junie Spencer, FNP       Allergies    Patient has no known allergies.    Review of Systems   Review of Systems  Constitutional:  Positive for activity change, appetite change and fatigue.  Gastrointestinal:  Positive for nausea.  Musculoskeletal:  Positive for myalgias.  Neurological:  Positive for weakness (Generalized).  All other systems reviewed and are negative.   Physical Exam Updated Vital Signs BP (!) 41/18   Pulse (!) 130   Temp 98.3 F (36.8 C) (Oral)   Resp 20   Wt 106.6 kg   SpO2 (!) 77%   BMI 45.90 kg/m  Physical Exam Vitals and nursing note reviewed.  Constitutional:      General: She is not in acute distress.    Appearance: Normal appearance. She is well-developed. She is ill-appearing. She is not toxic-appearing or diaphoretic.  HENT:     Head: Normocephalic and atraumatic.     Right Ear: External ear normal.     Left Ear: External ear normal.     Nose: Nose normal.     Mouth/Throat:     Mouth: Mucous membranes are dry.  Eyes:     Extraocular Movements: Extraocular movements intact.     Conjunctiva/sclera: Conjunctivae normal.  Cardiovascular:     Rate and Rhythm: Tachycardia present. Rhythm irregular.  Pulmonary:     Effort: Pulmonary effort is normal. No respiratory distress.     Breath sounds: No wheezing or rales.  Abdominal:     General: There is no distension.     Palpations: Abdomen is soft.     Tenderness: There is no abdominal tenderness.  Genitourinary:    Comments: Melanotic diarrhea present in diaper. Musculoskeletal:        General: Tenderness present. No swelling.     Cervical back: Normal range of motion and neck supple.     Right lower leg: No edema.     Left lower leg: No edema.  Skin:    General: Skin is warm and dry.     Findings: Bruising present.  Neurological:     General: No focal deficit present.     Mental Status: She is alert and oriented to person, place, and time.  Psychiatric:        Mood and Affect: Mood normal.        Behavior:  Behavior normal.     ED Results / Procedures / Treatments   Labs (all labs ordered are listed, but only abnormal results are displayed) Labs Reviewed  LACTIC ACID, PLASMA - Abnormal; Notable for the following components:      Result Value   Lactic Acid, Venous 5.2 (*)    All other components within normal limits  LACTIC ACID, PLASMA - Abnormal; Notable for the following components:   Lactic Acid, Venous 5.6 (*)    All other components within normal limits  COMPREHENSIVE METABOLIC PANEL - Abnormal; Notable for the following components:   Sodium 129 (*)  Chloride 94 (*)    Glucose, Bld 176 (*)    Calcium 7.5 (*)    Total Protein 4.8 (*)    Albumin 2.0 (*)    Alkaline Phosphatase 135 (*)    Total Bilirubin 1.3 (*)    All other components within normal limits  CBC WITH DIFFERENTIAL/PLATELET - Abnormal; Notable for the following components:   WBC 26.3 (*)    RBC 3.72 (*)    Hemoglobin 11.5 (*)    HCT 33.1 (*)    RDW 16.2 (*)    nRBC 0.7 (*)    Neutro Abs 22.8 (*)    Monocytes Absolute 1.3 (*)    Abs Immature Granulocytes 0.60 (*)    All other components within normal limits  BRAIN NATRIURETIC PEPTIDE - Abnormal; Notable for the following components:   B Natriuretic Peptide 159.0 (*)    All other components within normal limits  BLOOD GAS, VENOUS - Abnormal; Notable for the following components:   pCO2, Ven 38 (*)    pO2, Ven <31 (*)    All other components within normal limits  PROTIME-INR - Abnormal; Notable for the following components:   Prothrombin Time >90.0 (*)    INR >10.0 (*)    All other components within normal limits  POC OCCULT BLOOD, ED - Abnormal; Notable for the following components:   Fecal Occult Bld POSITIVE (*)    All other components within normal limits  CULTURE, BLOOD (ROUTINE X 2)  CULTURE, BLOOD (ROUTINE X 2)  MAGNESIUM  URINALYSIS, W/ REFLEX TO CULTURE (INFECTION SUSPECTED)  PROTIME-INR  TYPE AND SCREEN  TROPONIN I (HIGH SENSITIVITY)   TROPONIN I (HIGH SENSITIVITY)    EKG EKG Interpretation  Date/Time:  Monday September 13 2022 11:28:08 EDT Ventricular Rate:  161 PR Interval:    QRS Duration: 59 QT Interval:  267 QTC Calculation: 437 R Axis:   14 Text Interpretation: Atrial fibrillation with rapid V-rate Low voltage, precordial leads Repolarization abnormality, prob rate related Confirmed by Gloris Manchester [id number]) on 09/24/2022 11:46:21 AM  Radiology DG Chest Portable 1 View  Result Date: 09/20/2022 CLINICAL DATA:  Altered mental status. EXAM: PORTABLE CHEST 1 VIEW COMPARISON:  Radiographs 09/01/2022 and 06/04/2022. CT 09/05/2022 and 06/04/2022. FINDINGS: 1727 hours. Two views submitted. Left subclavian pacemaker leads project over the right atrium and right ventricle. The heart size and mediastinal contours are stable with aortic atherosclerosis. External pacer overlies the heart. Moderate left and small right pleural effusions are unchanged. Patchy bilateral airspace opacities are unchanged from the earlier examination, and the right upper lobe component has improved from the studies performed in March. No evidence of pneumothorax. The bones appear unchanged. IMPRESSION: No significant change in bilateral pleural effusions and patchy bilateral airspace opacities from studies done earlier today. Electronically Signed   By: Carey Bullocks M.D.   On: 09/15/2022 18:08   CT CHEST ABDOMEN PELVIS WO CONTRAST  Result Date: 09/11/2022 CLINICAL DATA:  Sepsis.  Nausea, weakness in left leg pain. EXAM: CT CHEST, ABDOMEN AND PELVIS WITHOUT CONTRAST TECHNIQUE: Multidetector CT imaging of the chest, abdomen and pelvis was performed following the standard protocol without IV contrast. RADIATION DOSE REDUCTION: This exam was performed according to the departmental dose-optimization program which includes automated exposure control, adjustment of the mA and/or kV according to patient size and/or use of iterative reconstruction technique. COMPARISON:   CT chest 06/04/2022, CT ANGIOGRAPHY OF ABDOMINAL AORTA WITH ILIOFEMORAL RUNOFF 05/31/2022 FINDINGS: CT CHEST FINDINGS Cardiovascular: Normal heart size. No pericardial effusion. Aortic  atherosclerosis. Left chest wall pacer with leads in the right atrial appendage and right ventricle. Mediastinum/Nodes: No enlarged mediastinal, hilar, or axillary lymph nodes. Thyroid gland, trachea, and esophagus demonstrate no significant findings. Lungs/Pleura: Moderate left pleural effusion and small right pleural effusion. Overlying compressive type atelectasis is noted within bilateral lower lung zones. Multifocal bilateral ground-glass and airspace density nodules are identified within both lungs which appear new compared with 06/04/2022. Partial clearing of dense airspace consolidation within the posterolateral right upper lobe with residual airspace and ground-glass attenuation, image 51/4. Musculoskeletal: No chest wall mass or suspicious bone lesions identified. CT ABDOMEN PELVIS FINDINGS Hepatobiliary: There is diffuse hepatic steatosis. No focal liver lesion identified. Multiple stones are identified throughout the liver. Stacked scratch set numerous stacked stones are identified within the common bile duct measuring up to 5 mm. These are increased in multiplicity compared with 05/31/2022. Pancreas: Unremarkable. No pancreatic ductal dilatation or surrounding inflammatory changes. Spleen: Normal in size without focal abnormality. Adrenals/Urinary Tract: Adrenal glands are unremarkable. Kidneys are normal, without renal calculi, focal lesion, or hydronephrosis. Bladder is unremarkable. Stomach/Bowel: Stomach is within normal limits. Appendix appears normal. No evidence of bowel wall thickening, distention, or inflammatory changes. Intramural fatty deposition is noted involving the ascending colon no surrounding inflammatory changes identified. Sigmoid diverticulosis identified. Vascular/Lymphatic: Aortic atherosclerosis.  No signs abdominopelvic adenopathy. Reproductive: Uterus and bilateral adnexa are unremarkable. Other: No free fluid or fluid collections. No signs of pneumoperitoneum. Musculoskeletal: Diffuse osteopenia. Multilevel lumbar degenerative disc disease. IMPRESSION: 1. Multifocal bilateral ground-glass and airspace density nodules are identified within both lungs which appear new compared with 06/04/2022. Findings are favored to represent multifocal pneumonia. 2. Persistent ground-glass and airspace consolidation within the right upper lobe. 3. Moderate left pleural effusion and small right pleural effusion with overlying compressive type atelectasis. 4. Hepatic steatosis. 5. Multiple stacked stones are identified within the common bile duct measuring up to 5 mm. These are increased in multiplicity compared with 05/31/2022. 6. Sigmoid diverticulosis without evidence for acute diverticulitis. 7.  Aortic Atherosclerosis (ICD10-I70.0). Electronically Signed   By: Signa Kell M.D.   On: 09/21/2022 15:43   CT Head Wo Contrast  Result Date: 09/10/2022 CLINICAL DATA:  Mental status change.  Unknown cause. EXAM: CT HEAD WITHOUT CONTRAST TECHNIQUE: Contiguous axial images were obtained from the base of the skull through the vertex without intravenous contrast. RADIATION DOSE REDUCTION: This exam was performed according to the departmental dose-optimization program which includes automated exposure control, adjustment of the mA and/or kV according to patient size and/or use of iterative reconstruction technique. COMPARISON:  CT examination dated June 04, 2022 FINDINGS: Brain: Right frontal/parietal region hypodensity suggesting prior infarct, is more conspicuous on this examination. Diffuse low-attenuation of the periventricular white matter presumed chronic microvascular ischemic changes. Vascular: No hyperdense vessel or unexpected calcification. Skull: Normal. Negative for fracture or focal lesion. Sinuses/Orbits: No  acute finding. Other: None. IMPRESSION: 1. Right frontal/parietal region hypodensity suggesting prior infarct, is more conspicuous on this examination. 2. Chronic microvascular ischemic changes of the periventricular white matter. Electronically Signed   By: Larose Hires D.O.   On: 09/25/2022 15:21   DG Chest Port 1 View  Result Date: 09/03/2022 CLINICAL DATA:  Questionable sepsis. EXAM: PORTABLE CHEST 1 VIEW COMPARISON:  06/04/2022 FINDINGS: Stable cardiomediastinal contours. New small bilateral pleural effusions are identified, left greater right. Patchy nodular opacities in the right upper lobe corresponding to previous right upper lobe pneumonia is favored to represent post infectious/inflammatory scarring. IMPRESSION: 1. New small bilateral  pleural effusions, left greater right. 2. Patchy nodular opacities in the right upper lobe corresponding to previous right upper lobe pneumonia is favored to represent post infectious/inflammatory scarring. Electronically Signed   By: Signa Kell M.D.   On: 09/24/2022 13:10    Procedures Procedures    Medications Ordered in ED Medications  0.9 %  sodium chloride infusion (has no administration in time range)  vancomycin (VANCOREADY) IVPB 2000 mg/400 mL (0 mg Intravenous Paused 09/01/2022 1726)  ceFEPIme (MAXIPIME) 2 g in sodium chloride 0.9 % 100 mL IVPB (has no administration in time range)  vancomycin (VANCOREADY) IVPB 1750 mg/350 mL (has no administration in time range)  vasopressin (PITRESSIN) 20 Units in sodium chloride 0.9 % 100 mL infusion-*FOR SHOCK* (0.03 Units/min Intravenous New Bag/Given 08/29/2022 1746)  0.9 %  sodium chloride infusion (has no administration in time range)  norepinephrine (LEVOPHED) 4mg  in (0.016 mg/mL) premix infusion (10 mcg/min Intravenous New Bag/Given 09/23/2022 1801)  lactated ringers bolus 500 mL (0 mLs Intravenous Stopped 08/29/2022 1501)  sodium chloride 0.9 % bolus 1,000 mL (0 mLs Intravenous Stopped 08/29/2022 1744)     And  sodium chloride 0.9 % bolus 500 mL (0 mLs Intravenous Stopped 09/03/2022 1744)  ceFEPIme (MAXIPIME) 2 g in sodium chloride 0.9 % 100 mL IVPB (0 g Intravenous Stopped 09/01/2022 1639)  metroNIDAZOLE (FLAGYL) IVPB 500 mg (0 mg Intravenous Stopped 09/12/2022 1744)  fentaNYL (SUBLIMAZE) injection 50 mcg (50 mcg Intravenous Given 09/09/2022 1648)  ondansetron (ZOFRAN) injection 4 mg (4 mg Intravenous Given 09/18/2022 1648)  prothrombin complex conc human (KCENTRA) IVPB 4,848 Units (4,848 Units Intravenous New Bag/Given 09/14/2022 1726)  phytonadione (VITAMIN K) 10 mg in dextrose 5 % 50 mL IVPB (0 mg Intravenous Stopped 09/09/2022 1744)    ED Course/ Medical Decision Making/ A&P                             Medical Decision Making Amount and/or Complexity of Data Reviewed Labs: ordered. Radiology: ordered. ECG/medicine tests: ordered.  Risk Prescription drug management. Decision regarding hospitalization.   This patient presents to the ED for concern of nausea, this involves an extensive number of treatment options, and is a complaint that carries with it a high risk of complications and morbidity.  The differential diagnosis includes infection, arrhythmia, dehydration, metabolic derangements, ACS, SBO, GERD, gastritis, cholelithiasis, nephrolithiasis, ICH   Co morbidities that complicate the patient evaluation  HTN, tachycardia-bradycardia syndrome s/p [facility] pacemaker placement, HLD, CHF, atrial fibrillation   Additional history obtained:  Additional history obtained from EMS, patient's son External records from outside source obtained and reviewed including EMR   Lab Tests:  I Ordered, and personally interpreted labs.  The pertinent results include: Leukocytosis and lactic acidosis are present consistent with sepsis. Despite melanotic stool, hemoglobin is baseline.  INR is severely elevated.   Imaging Studies ordered:  I ordered imaging studies including chest x-ray, CT of head, chest,  abdomen, pelvis I independently visualized and interpreted imaging which showed multifocal pneumonia, bilateral pleural effusions, increased in number of previous identified stones in CBD I agree with the radiologist interpretation   Cardiac Monitoring: / EKG:  The patient was maintained on a cardiac monitor.  I personally viewed and interpreted the cardiac monitored which showed an underlying rhythm of: Atrial fibrillation   Consultations Obtained:  I requested consultation with the intensivist, Dr. Denese Killings,  and discussed lab and imaging findings as well as pertinent plan - they  recommend: Admission at [facility], ICU   Problem List / ED Course / Critical interventions / Medication management  Patient presents with initial complaint of nausea.  On arrival, she is in atrial fibrillation with RVR.  Heart rate is in the 140s.  Blood pressures are soft but in the normal range at this time.  Patient is awake and alert.  She endorses pain in her legs.  She is covered in bruises.  On inspection of her rectal area, she has a large amount of melanotic stool present.  Presence of blood was confirmed with Hemoccult testing.  She has raw skin intergluteal area where she also endorses pain.  She denies any other areas of discomfort.  Currently, her abdomen is soft and nontender.  Her breathing is unlabored and SpO2 is normal on room air.  Broad workup was initiated.  Initial lab work shows a leukocytosis and lactic acidosis.  Patient was treated empirically for sepsis.  Despite large volume of melanotic stool in the ED, patient does not appear to have any drop in hemoglobin on initial lab work.  Patient had difficult vascular access.  We were able to place a small 22-gauge peripheral IV in area of right shoulder.  On inspection of bilateral arms, she has very small veins with very edematous tissue.  Patient underwent CT scanning which was notable for multifocal pneumonia.  Attempt was made for right IJ  central line.  Patient was unable to tolerate being under sterile sheet.  During this time, she also became hypoxic.  Still she was removed and she was placed on supplemental oxygen.  On ultrasound inspection of the femoral arteries, there are no good targets.  Second peripheral IV was able to be placed in left shoulder.  Further lab work shows INR greater than 10.  Initially, vitamin K was ordered.  Given her critical illness, I spoke with intensivist, Dr. Denese Killings, who accepted the patient in transfer to [facility], ICU.  He did recommend giving Kcentra, which was ordered.  I spoke with patient's son, who she lives with.  Patient's son was not able to provide much more history.  He simply states that she has had nausea and poor appetite lately.  He was informed of her severe condition at this time.  He was encouraged to come to the ED, and inform any other family members who may want to see her.  Care of patient was signed out to oncoming ED provider. I ordered medication including IV fluids and broad-spectrum antibiotics for sepsis, fentanyl for analgesia, Zofran for nausea Reevaluation of the patient after these medicines showed that the patient stayed the same I have reviewed the patients home medicines and have made adjustments as needed   Social Determinants of Health:  Lives at home with son, bedbound for the past month  CRITICAL CARE Performed by: Gloris Manchester   Total critical care time: 35 minutes  Critical care time was exclusive of separately billable procedures and treating other patients.  Critical care was necessary to treat or prevent imminent or life-threatening deterioration.  Critical care was time spent personally by me on the following activities: development of treatment plan with patient and/or surrogate as well as nursing, discussions with consultants, evaluation of patient's response to treatment, examination of patient, obtaining history from patient or surrogate, ordering  and performing treatments and interventions, ordering and review of laboratory studies, ordering and review of radiographic studies, pulse oximetry and re-evaluation of patient's condition.  Final Clinical Impression(s) / ED Diagnoses Final diagnoses:  Multifocal pneumonia  Acute respiratory failure with hypoxia (HCC)  Septic shock (HCC)  Elevated INR  Melena    Rx / DC Orders ED Discharge Orders     None         Gloris Manchester, MD 08/29/2022 1829

## 2022-09-27 NOTE — ED Provider Notes (Signed)
5:13 PM  Called to bedside. Patient bp dropping. Sign out was not given at this time. Nursing staff states patient has received 2.5 L IVF already with dx of septic shock from pneumonia. I ordered peripheral levophed. Poor vasculature in neck. Unsuccessful central line completed by previous physician. Will order bedside US. Bilateral breath sounds present.   Femoral central line attempted and unsuccessful. Will continue max dose peripheral levophed at this time at 20. BP still low. Vasopressin started.   Patient is becoming altered. Still has a pulse. Now Vtach on monitor. Defib at 200J completed. Sinus tachycardia 110 bpm after defib. AMS remains.  Patient has DNR paperwork at bedside.I spoke with patients son Iantha Fallen outside of room. States patient does not want intubation. We spoke of the severity of his mothers illness and the likelihood of passing soon.  5:29 PM  Patient signed out to me by previous ED physician. 77 yo female presenting to ED for nausea. In afib with rvr and soft blood pressures. Melenotic stool. Multiples bruising. Suprapubic inr on labs. Kcentra given. Vitamin K given. Dx with septic shock due to pneumonia. Vanc and cef given. On levophed at 20 and vasopressin. DNR/DNI. Also infarct on CTH. ... Multiple stones in CBD but stable liver profile and bilis....  [place]    Physical Exam  BP (!) 72/58   Pulse (!) 123   Temp 98.3 F (36.8 C) (Oral)   Resp (!) 34   Wt 106.6 kg   SpO2 97%   BMI 45.90 kg/m   Physical Exam  Procedures  Procedures  ED Course / MDM    Medical Decision Making Amount and/or Complexity of Data Reviewed Labs: ordered. Radiology: ordered. ECG/medicine tests: ordered.  Risk Prescription drug management. Decision regarding hospitalization.   ***

## 2022-09-27 DEATH — deceased

## 2022-10-15 ENCOUNTER — Telehealth: Payer: Medicare Other | Admitting: Family

## 2022-10-19 ENCOUNTER — Ambulatory Visit (INDEPENDENT_AMBULATORY_CARE_PROVIDER_SITE_OTHER): Payer: Medicare Other | Admitting: Gastroenterology

## 2022-10-21 ENCOUNTER — Telehealth: Payer: Medicare Other | Admitting: Family

## 2022-10-29 ENCOUNTER — Encounter: Payer: Medicare Other | Admitting: Cardiovascular Disease

## 2022-11-22 ENCOUNTER — Ambulatory Visit: Payer: Self-pay | Admitting: *Deleted

## 2022-11-22 NOTE — Patient Outreach (Signed)
Case closure  Notified patient deceased    Cala Bradford L. Noelle Penner, RN, BSN, CCM [facility] Care Management Community Coordinator Office number [phone]

## 2022-12-29 ENCOUNTER — Ambulatory Visit: Payer: Medicare Other | Admitting: Neurology

## 2023-01-28 ENCOUNTER — Encounter: Payer: Medicare Other | Admitting: Cardiovascular Disease

## 2023-06-14 NOTE — Patient Instructions (Signed)
 Visit Information  Thank you for taking time to visit with me today. Please don't hesitate to contact me if I can be of assistance to you.   Following are the goals we discussed today:   Goals Addressed             This Visit's Progress    [facility] care coordination services       Interventions Today    Flowsheet Row Most Recent Value  Chronic Disease   Chronic disease during today's visit Other  [SW referral, memory, home health services, confirmed transportation]  General Interventions   General Interventions Discussed/Reviewed General Interventions Reviewed, Community Resources, Doctor Visits  Doctor Visits Discussed/Reviewed Doctor Visits Reviewed, Specialist, PCP  PCP/Specialist Visits Compliance with follow-up visit  Exercise Interventions   Exercise Discussed/Reviewed Exercise Reviewed, Physical Activity  Physical Activity Discussed/Reviewed Physical Activity Discussed  Education Interventions   Education Provided Provided Education  [place], [facility] SW services, suggest keeping a note book to assist with memory concerns, home health]  Provided Verbal Education On Medication, [employer], Sick Day Rules, Other  Mental Health Interventions   Mental Health Discussed/Reviewed Mental Health Discussed, Coping Strategies  Nutrition Interventions   Nutrition Discussed/Reviewed Nutrition Discussed, Fluid intake  Pharmacy Interventions   Pharmacy Dicussed/Reviewed Pharmacy Topics Discussed, Medications and their functions, Affording Medications              Our next appointment is by telephone on pending SW interventions at pending  Please call the care guide team at [phone] if you need to cancel or reschedule your appointment.   If you are experiencing a Mental Health or Behavioral Health Crisis or need someone to talk to, please call the Suicide and Crisis Lifeline: 988 call the [place] National Suicide Prevention Lifeline: 716 599 2708 or TTY: 681-083-2297  TTY (309)020-0230) to talk to a trained counselor call 1-800-273-TALK (toll free, 24 hour hotline) call the [facility]: (580)560-5260 call 911   No computer no preference for AVS  The patient has been provided with contact information for the care management team and has been advised to call with any health related questions or concerns.    Heitor Steinhoff L. Noelle Penner, RN, BSN, CCM [facility] RN Care Manager [phone]
